# Patient Record
Sex: Female | Born: 1935 | Race: White | Hispanic: No | Smoking: Never smoker
Health system: Southern US, Community
[De-identification: ages and names within clinical notes are randomized; demographics above are authoritative.]

## PROBLEM LIST (undated history)

## (undated) DIAGNOSIS — K2100 Gastro-esophageal reflux disease with esophagitis, without bleeding: Secondary | ICD-10-CM

## (undated) DIAGNOSIS — M62838 Other muscle spasm: Secondary | ICD-10-CM

## (undated) DIAGNOSIS — K222 Esophageal obstruction: Secondary | ICD-10-CM

## (undated) DIAGNOSIS — I4891 Unspecified atrial fibrillation: Secondary | ICD-10-CM

## (undated) DIAGNOSIS — I499 Cardiac arrhythmia, unspecified: Secondary | ICD-10-CM

## (undated) DIAGNOSIS — R259 Unspecified abnormal involuntary movements: Secondary | ICD-10-CM

## (undated) DIAGNOSIS — F419 Anxiety disorder, unspecified: Secondary | ICD-10-CM

## (undated) DIAGNOSIS — I639 Cerebral infarction, unspecified: Secondary | ICD-10-CM

## (undated) DIAGNOSIS — L259 Unspecified contact dermatitis, unspecified cause: Secondary | ICD-10-CM

## (undated) DIAGNOSIS — E1129 Type 2 diabetes mellitus with other diabetic kidney complication: Secondary | ICD-10-CM

## (undated) DIAGNOSIS — C801 Malignant (primary) neoplasm, unspecified: Secondary | ICD-10-CM

## (undated) DIAGNOSIS — N3946 Mixed incontinence: Secondary | ICD-10-CM

## (undated) DIAGNOSIS — R930 Abnormal findings on diagnostic imaging of skull and head, not elsewhere classified: Secondary | ICD-10-CM

## (undated) DIAGNOSIS — K219 Gastro-esophageal reflux disease without esophagitis: Secondary | ICD-10-CM

## (undated) DIAGNOSIS — I739 Peripheral vascular disease, unspecified: Secondary | ICD-10-CM

## (undated) DIAGNOSIS — E039 Hypothyroidism, unspecified: Secondary | ICD-10-CM

## (undated) DIAGNOSIS — M545 Low back pain, unspecified: Secondary | ICD-10-CM

## (undated) DIAGNOSIS — R0600 Dyspnea, unspecified: Secondary | ICD-10-CM

## (undated) DIAGNOSIS — M4800 Spinal stenosis, site unspecified: Secondary | ICD-10-CM

## (undated) DIAGNOSIS — N390 Urinary tract infection, site not specified: Secondary | ICD-10-CM

## (undated) DIAGNOSIS — M25569 Pain in unspecified knee: Secondary | ICD-10-CM

## (undated) DIAGNOSIS — R002 Palpitations: Secondary | ICD-10-CM

## (undated) DIAGNOSIS — G4730 Sleep apnea, unspecified: Secondary | ICD-10-CM

## (undated) DIAGNOSIS — K3184 Gastroparesis: Secondary | ICD-10-CM

## (undated) DIAGNOSIS — M1990 Unspecified osteoarthritis, unspecified site: Secondary | ICD-10-CM

## (undated) DIAGNOSIS — I119 Hypertensive heart disease without heart failure: Secondary | ICD-10-CM

## (undated) DIAGNOSIS — D649 Anemia, unspecified: Secondary | ICD-10-CM

## (undated) DIAGNOSIS — E785 Hyperlipidemia, unspecified: Secondary | ICD-10-CM

## (undated) DIAGNOSIS — I509 Heart failure, unspecified: Secondary | ICD-10-CM

## (undated) DIAGNOSIS — N952 Postmenopausal atrophic vaginitis: Secondary | ICD-10-CM

## (undated) DIAGNOSIS — N183 Chronic kidney disease, stage 3 unspecified: Secondary | ICD-10-CM

## (undated) DIAGNOSIS — G609 Hereditary and idiopathic neuropathy, unspecified: Secondary | ICD-10-CM

## (undated) DIAGNOSIS — K449 Diaphragmatic hernia without obstruction or gangrene: Secondary | ICD-10-CM

## (undated) DIAGNOSIS — L820 Inflamed seborrheic keratosis: Secondary | ICD-10-CM

## (undated) DIAGNOSIS — M542 Cervicalgia: Secondary | ICD-10-CM

## (undated) DIAGNOSIS — R197 Diarrhea, unspecified: Secondary | ICD-10-CM

## (undated) DIAGNOSIS — I10 Essential (primary) hypertension: Secondary | ICD-10-CM

## (undated) DIAGNOSIS — N329 Bladder disorder, unspecified: Secondary | ICD-10-CM

## (undated) DIAGNOSIS — E559 Vitamin D deficiency, unspecified: Secondary | ICD-10-CM

## (undated) DIAGNOSIS — E119 Type 2 diabetes mellitus without complications: Secondary | ICD-10-CM

## (undated) DIAGNOSIS — L84 Corns and callosities: Secondary | ICD-10-CM

## (undated) DIAGNOSIS — L821 Other seborrheic keratosis: Secondary | ICD-10-CM

## (undated) DIAGNOSIS — E538 Deficiency of other specified B group vitamins: Secondary | ICD-10-CM

## (undated) DIAGNOSIS — R609 Edema, unspecified: Secondary | ICD-10-CM

## (undated) DIAGNOSIS — H538 Other visual disturbances: Secondary | ICD-10-CM

## (undated) DIAGNOSIS — G459 Transient cerebral ischemic attack, unspecified: Secondary | ICD-10-CM

## (undated) DIAGNOSIS — L299 Pruritus, unspecified: Secondary | ICD-10-CM

## (undated) DIAGNOSIS — Z8673 Personal history of transient ischemic attack (TIA), and cerebral infarction without residual deficits: Secondary | ICD-10-CM

## (undated) DIAGNOSIS — K5900 Constipation, unspecified: Secondary | ICD-10-CM

## (undated) DIAGNOSIS — N318 Other neuromuscular dysfunction of bladder: Secondary | ICD-10-CM

## (undated) DIAGNOSIS — N61 Mastitis without abscess: Secondary | ICD-10-CM

## (undated) DIAGNOSIS — I110 Hypertensive heart disease with heart failure: Secondary | ICD-10-CM

## (undated) DIAGNOSIS — R5383 Other fatigue: Secondary | ICD-10-CM

## (undated) DIAGNOSIS — F411 Generalized anxiety disorder: Secondary | ICD-10-CM

## (undated) DIAGNOSIS — R5381 Other malaise: Secondary | ICD-10-CM

## (undated) DIAGNOSIS — R300 Dysuria: Secondary | ICD-10-CM

## (undated) DIAGNOSIS — R110 Nausea: Secondary | ICD-10-CM

## (undated) HISTORY — DX: Corns and callosities: L84

## (undated) HISTORY — DX: Diaphragmatic hernia without obstruction or gangrene: K44.9

## (undated) HISTORY — PX: HERNIA REPAIR: SHX51

## (undated) HISTORY — DX: Hypertensive heart disease without heart failure: I11.9

## (undated) HISTORY — DX: Dyspnea, unspecified: R06.00

## (undated) HISTORY — DX: Gastro-esophageal reflux disease with esophagitis: K21.0

## (undated) HISTORY — DX: Anxiety disorder, unspecified: F41.9

## (undated) HISTORY — DX: Unspecified contact dermatitis, unspecified cause: L25.9

## (undated) HISTORY — PX: BACK SURGERY: SHX140

## (undated) HISTORY — DX: Abnormal findings on diagnostic imaging of skull and head, not elsewhere classified: R93.0

## (undated) HISTORY — DX: Mastitis without abscess: N61.0

## (undated) HISTORY — DX: Inflamed seborrheic keratosis: L82.0

## (undated) HISTORY — DX: Low back pain: M54.5

## (undated) HISTORY — PX: EYE SURGERY: SHX253

## (undated) HISTORY — DX: Type 2 diabetes mellitus with other diabetic kidney complication: E11.29

## (undated) HISTORY — DX: Other malaise: R53.81

## (undated) HISTORY — DX: Nausea: R11.0

## (undated) HISTORY — DX: Vitamin D deficiency, unspecified: E55.9

## (undated) HISTORY — DX: Mixed incontinence: N39.46

## (undated) HISTORY — DX: Spinal stenosis, site unspecified: M48.00

## (undated) HISTORY — DX: Cervicalgia: M54.2

## (undated) HISTORY — DX: Unspecified atrial fibrillation: I48.91

## (undated) HISTORY — DX: Essential (primary) hypertension: I10

## (undated) HISTORY — DX: Other seborrheic keratosis: L82.1

## (undated) HISTORY — DX: Low back pain, unspecified: M54.50

## (undated) HISTORY — DX: Other neuromuscular dysfunction of bladder: N31.8

## (undated) HISTORY — DX: Type 2 diabetes mellitus without complications: E11.9

## (undated) HISTORY — DX: Hyperlipidemia, unspecified: E78.5

## (undated) HISTORY — DX: Cerebral infarction, unspecified: I63.9

## (undated) HISTORY — DX: Transient cerebral ischemic attack, unspecified: G45.9

## (undated) HISTORY — DX: Diarrhea, unspecified: R19.7

## (undated) HISTORY — DX: Constipation, unspecified: K59.00

## (undated) HISTORY — DX: Gastro-esophageal reflux disease without esophagitis: K21.9

## (undated) HISTORY — DX: Postmenopausal atrophic vaginitis: N95.2

## (undated) HISTORY — DX: Generalized anxiety disorder: F41.1

## (undated) HISTORY — DX: Edema, unspecified: R60.9

## (undated) HISTORY — DX: Unspecified osteoarthritis, unspecified site: M19.90

## (undated) HISTORY — DX: Esophageal obstruction: K22.2

## (undated) HISTORY — DX: Other fatigue: R53.83

## (undated) HISTORY — DX: Other muscle spasm: M62.838

## (undated) HISTORY — DX: Chronic kidney disease, stage 3 (moderate): N18.3

## (undated) HISTORY — DX: Malignant (primary) neoplasm, unspecified: C80.1

## (undated) HISTORY — DX: Bladder disorder, unspecified: N32.9

## (undated) HISTORY — DX: Cardiac arrhythmia, unspecified: I49.9

## (undated) HISTORY — PX: ABDOMINAL HYSTERECTOMY: SHX81

## (undated) HISTORY — PX: SKIN CANCER DESTRUCTION: SHX778

## (undated) HISTORY — DX: Personal history of transient ischemic attack (TIA), and cerebral infarction without residual deficits: Z86.73

## (undated) HISTORY — DX: Hypertensive heart disease with heart failure: I11.0

## (undated) HISTORY — DX: Deficiency of other specified B group vitamins: E53.8

## (undated) HISTORY — DX: Anemia, unspecified: D64.9

## (undated) HISTORY — PX: APPENDECTOMY: SHX54

## (undated) HISTORY — DX: Gastroparesis: K31.84

## (undated) HISTORY — DX: Heart failure, unspecified: I50.9

## (undated) HISTORY — DX: Dysuria: R30.0

## (undated) HISTORY — DX: Sleep apnea, unspecified: G47.30

## (undated) HISTORY — DX: Palpitations: R00.2

## (undated) HISTORY — DX: Other visual disturbances: H53.8

## (undated) HISTORY — DX: Hereditary and idiopathic neuropathy, unspecified: G60.9

## (undated) HISTORY — DX: Chronic kidney disease, stage 3 unspecified: N18.30

## (undated) HISTORY — DX: Pain in unspecified knee: M25.569

## (undated) HISTORY — DX: Hypothyroidism, unspecified: E03.9

## (undated) HISTORY — PX: OTHER SURGICAL HISTORY: SHX169

## (undated) HISTORY — DX: Pruritus, unspecified: L29.9

## (undated) HISTORY — DX: Urinary tract infection, site not specified: N39.0

## (undated) HISTORY — DX: Gastro-esophageal reflux disease with esophagitis, without bleeding: K21.00

## (undated) HISTORY — DX: Unspecified abnormal involuntary movements: R25.9

## (undated) HISTORY — DX: Peripheral vascular disease, unspecified: I73.9

---

## 1963-08-02 HISTORY — PX: THYROIDECTOMY, PARTIAL: SHX18

## 1989-08-01 HISTORY — PX: CHOLECYSTECTOMY: SHX55

## 1999-11-05 ENCOUNTER — Encounter: Payer: Self-pay | Admitting: Internal Medicine

## 1999-11-05 ENCOUNTER — Encounter: Admission: RE | Admit: 1999-11-05 | Discharge: 1999-11-05 | Payer: Self-pay | Admitting: Internal Medicine

## 2000-03-10 ENCOUNTER — Other Ambulatory Visit: Admission: RE | Admit: 2000-03-10 | Discharge: 2000-03-10 | Payer: Self-pay | Admitting: Internal Medicine

## 2000-09-28 ENCOUNTER — Encounter: Payer: Self-pay | Admitting: Orthopedic Surgery

## 2000-09-28 ENCOUNTER — Encounter: Admission: RE | Admit: 2000-09-28 | Discharge: 2000-09-28 | Payer: Self-pay | Admitting: Orthopedic Surgery

## 2000-10-17 ENCOUNTER — Encounter: Payer: Self-pay | Admitting: Neurosurgery

## 2000-10-19 ENCOUNTER — Inpatient Hospital Stay (HOSPITAL_COMMUNITY): Admission: RE | Admit: 2000-10-19 | Discharge: 2000-10-20 | Payer: Self-pay | Admitting: Neurosurgery

## 2000-10-19 ENCOUNTER — Encounter: Payer: Self-pay | Admitting: Neurosurgery

## 2000-12-12 ENCOUNTER — Encounter: Admission: RE | Admit: 2000-12-12 | Discharge: 2000-12-28 | Payer: Self-pay | Admitting: Neurosurgery

## 2001-03-21 ENCOUNTER — Ambulatory Visit (HOSPITAL_BASED_OUTPATIENT_CLINIC_OR_DEPARTMENT_OTHER): Admission: RE | Admit: 2001-03-21 | Discharge: 2001-03-21 | Payer: Self-pay | Admitting: Pulmonary Disease

## 2001-05-23 ENCOUNTER — Other Ambulatory Visit: Admission: RE | Admit: 2001-05-23 | Discharge: 2001-05-23 | Payer: Self-pay | Admitting: Endocrinology

## 2002-12-10 ENCOUNTER — Ambulatory Visit (HOSPITAL_COMMUNITY): Admission: RE | Admit: 2002-12-10 | Discharge: 2002-12-10 | Payer: Self-pay | Admitting: Gastroenterology

## 2002-12-10 ENCOUNTER — Encounter: Payer: Self-pay | Admitting: Gastroenterology

## 2002-12-31 ENCOUNTER — Ambulatory Visit (HOSPITAL_COMMUNITY): Admission: RE | Admit: 2002-12-31 | Discharge: 2002-12-31 | Payer: Self-pay | Admitting: Gastroenterology

## 2003-01-23 ENCOUNTER — Ambulatory Visit (HOSPITAL_COMMUNITY): Admission: RE | Admit: 2003-01-23 | Discharge: 2003-01-23 | Payer: Self-pay | Admitting: Gastroenterology

## 2003-03-10 ENCOUNTER — Encounter: Payer: Self-pay | Admitting: Internal Medicine

## 2003-03-10 ENCOUNTER — Encounter: Payer: Self-pay | Admitting: Emergency Medicine

## 2003-03-10 ENCOUNTER — Inpatient Hospital Stay (HOSPITAL_COMMUNITY): Admission: EM | Admit: 2003-03-10 | Discharge: 2003-03-14 | Payer: Self-pay | Admitting: Emergency Medicine

## 2003-03-11 ENCOUNTER — Encounter: Payer: Self-pay | Admitting: Cardiology

## 2003-08-04 LAB — HM DEXA SCAN

## 2003-08-21 ENCOUNTER — Encounter: Admission: RE | Admit: 2003-08-21 | Discharge: 2003-08-21 | Payer: Self-pay | Admitting: Internal Medicine

## 2004-07-23 ENCOUNTER — Ambulatory Visit: Payer: Self-pay | Admitting: Internal Medicine

## 2004-08-03 ENCOUNTER — Ambulatory Visit: Payer: Self-pay | Admitting: Internal Medicine

## 2004-09-06 ENCOUNTER — Ambulatory Visit: Payer: Self-pay | Admitting: Internal Medicine

## 2004-09-13 ENCOUNTER — Ambulatory Visit: Payer: Self-pay

## 2004-09-15 ENCOUNTER — Ambulatory Visit: Payer: Self-pay | Admitting: Gastroenterology

## 2004-09-17 ENCOUNTER — Ambulatory Visit (HOSPITAL_COMMUNITY): Admission: RE | Admit: 2004-09-17 | Discharge: 2004-09-17 | Payer: Self-pay | Admitting: Gastroenterology

## 2004-09-23 ENCOUNTER — Ambulatory Visit: Payer: Self-pay | Admitting: Gastroenterology

## 2006-03-05 ENCOUNTER — Encounter: Admission: RE | Admit: 2006-03-05 | Discharge: 2006-03-05 | Payer: Self-pay | Admitting: *Deleted

## 2006-03-21 ENCOUNTER — Ambulatory Visit: Admission: RE | Admit: 2006-03-21 | Discharge: 2006-03-21 | Payer: Self-pay | Admitting: Neurosurgery

## 2006-04-12 ENCOUNTER — Inpatient Hospital Stay (HOSPITAL_COMMUNITY): Admission: RE | Admit: 2006-04-12 | Discharge: 2006-04-19 | Payer: Self-pay | Admitting: Neurosurgery

## 2007-11-26 ENCOUNTER — Ambulatory Visit: Payer: Self-pay | Admitting: Internal Medicine

## 2007-12-07 ENCOUNTER — Ambulatory Visit: Payer: Self-pay | Admitting: Internal Medicine

## 2008-01-07 ENCOUNTER — Telehealth: Payer: Self-pay | Admitting: Internal Medicine

## 2008-01-17 ENCOUNTER — Encounter: Payer: Self-pay | Admitting: Internal Medicine

## 2010-08-01 DIAGNOSIS — G459 Transient cerebral ischemic attack, unspecified: Secondary | ICD-10-CM

## 2010-08-01 HISTORY — DX: Transient cerebral ischemic attack, unspecified: G45.9

## 2010-12-14 NOTE — Assessment & Plan Note (Signed)
[facility] OFFICE NOTE   NAME:Karen [name], Karen [name]                 MRN:          [id number]  DATE:11/26/2007                            DOB:          [birth year]    REASON FOR CONSULTATION:  Dysphagia with regurgitation and vomiting.   HISTORY:  This is an 77 year old white female with a history of  hypertension, diabetes, cardiac dysrhythmia, dyslipidemia,  hypothyroidism, arthritis, anxiety, and sleep apnea.  She is referred  through the courtesy of Dr. Nyoka Cowden regarding dysphagia, nausea and  vomiting.  The patient reports difficulties keeping down food or liquids  over the past 2 months.  Food and pills seem to be caught in the throat  area.  She states that the only relief is to induce vomiting.  She has  also had some chest pains which she states are similar to her myocardial  infarction.  She was evaluated by her primary care physician who did not  feel chest discomfort was necessarily cardiac.  The patient has been  regurgitating solids and liquids as well.  She thinks her vomitus is  occasionally blood tinged.  She has had some pharyngeal burning.  She  states that she has lost 8-9 pounds over the past 2 months.  She has  chronic alternating bowel habits which are unchanged.  No melena or  hematochezia.   PAST MEDICAL HISTORY:  As above.   PAST SURGICAL HISTORY:  Cholecystectomy, hernia repair, hysterectomy,  tubal ligation, breast surgery, appendectomy, and back surgery.   ALLERGIES:  1. SULFA.  2. VIOXX.  3. MACRODANTIN.  4. DOXYCYCLINE.  5. NONSTEROIDAL ANTI-INFLAMMATORIES.   CURRENT MEDICATIONS:  Prilosec, Lisinopril, Benicar, Synthroid, digoxin,  Xanax, Vicodin, Lantus insulin, NovoLog, and cyclobenzaprine.   FAMILY HISTORY:  Negative for gastrointestinal malignancy.  Father with  heart disease.   SOCIAL HISTORY:  The patient is married but her husband has been in a  nursing home for  3.5 years.  They had 3 children.  She lives alone.  She  worked previously at [employer] at [employer].  She does not smoke  or use alcohol.   REVIEW OF SYSTEMS:  Multiple entries per diagnostic evaluation.   PHYSICAL EXAMINATION:  GENERAL:  A pleasant elderly female in no acute  distress.  VITAL SIGNS:  Blood pressure is 142/58, heart rate is 100, weight is  134.4 pounds.  She is 5 feet 5 inches in height.  HEENT:  Sclerae are anicteric.  Conjunctivae are pink.  Oral mucosa is  intact.  No adenopathy.  LUNGS:  Clear.  HEART:  Regular.  ABDOMEN:  Soft, nontender, nondistended with good bowel sounds.  No  organomegaly, mass, or hernia.  EXTREMITIES:  Without clubbing, cyanosis, or edema.  Pulses are normal.  NEUROLOGIC:  She is alert and oriented x4 without focal deficit.   ANCILLARY DATA:  The patient last underwent upper endoscopy with Dr. Lyla Son, on September 23, 2004.  At that time she was dilated with a 54-  [place] Maloney dilator.  It should be noted that she also underwent  diagnostic esophagram, barium esophagram on September 17, 2004.  This  revealed tertiary spasms, anklyosing spondylitis, and thickening of the  distal esophagus though no obvious stricture.  A tablet passed without  resistance.   IMPRESSION:  This is an 77 year old female with multiple medical  problems who presents with what sounds like cervical dysphagia.   She may have a Zenker diverticulum, though none noted previously.  She  could also have cricopharyngeal hypertension or even a stricture not  appreciated previously.  In any event, she has had benefit from dilation  for similar symptoms in the past and wishes to proceed with that exam.  I certainly agree, as this may again help.  In terms of her diabetes, we will adjust her insulin pre-procedure.  I did review with her the nature of the procedure as well as risks,  benefits, and alternatives in detail.  She understood and agreed to   proceed.     Docia Chuck. Henrene Pastor, MD  Electronically Signed    JNP/MedQ  DD: 12/07/2007  DT: 12/07/2007  Job #: [id number]   cc:   Viviann Spare. Nyoka Cowden, M.D.

## 2010-12-17 NOTE — Op Note (Signed)
NAMEMarland Kitchen  Karen, GRZESKOWIAK NO.:  [id number]   MEDICAL RECORD NO.:  [id number]          PATIENT TYPE:  INP   LOCATION:  3313                         FACILITY[facility]  [facility]   PHYSICIAN:  Ashok Pall, M.D.     DATE OF BIRTH:  09/18/1923   DATE OF PROCEDURE:  04/12/2006  DATE OF DISCHARGE:                                 OPERATIVE REPORT   PREOPERATIVE DIAGNOSES:  1. Lumbar stenosis L3-4.  2. Lumbar spondylosis without myelopathy, L3-4.  3. Low back pain.  4. Lumbar radiculopathy.   POSTOPERATIVE DIAGNOSES:  1. Lumbar stenosis L3-4.  2. Lumbar spondylosis without myelopathy, L3-4.  3. Low back pain.  4. Lumbar radiculopathy.   PROCEDURES:  1. Posterior lumbar interbody arthrodesis, L3-4, using 11-mm PEEK cages      packed with local autograft, morcellized.  2. Posterolateral arthrodesis, L3-4,with local autograft, morcellized.  3. Pedicle screw fixation, nonsegmental, L3 to L4, 6.5 x45 mm Legacy      screws.   SURGEON:  Ashok Pall, M.D.   ASSISTANT:  Otilio Connors, M.D.   ANESTHESIA:  General endotracheal.   INDICATIONS:  Karen [name] is a  77 year old with severe stenosis at L3-4.  Secondary to that and secondary to facet arthropathy, she had compromise of  the L3 nerve roots and the L4 nerve roots.  I have recommended after she had  conservative treatment without relief of pain that she undergo decompressive  laminectomy and subsequent arthrodesis.   Karen [name] was brought to the operating room, intubated and placed under  a general anesthetic without difficulty.  She had a Foley catheter placed  under sterile conditions.  She was rolled prone onto a Jackson table and all  pressure points were properly padded.  Her back was prepped and she was  draped in a sterile fashion.  After infiltrating 80 mL of 0.5% lidocaine and  1:200,000 strength epinephrine into the lumbar region, I opened the skin  with a #10 blade.  I took this down to the thoracolumbar  fascia.  I then  exposed the lamina of L2, L3 and of L4.  I confirmed my location by taking  an intraoperative x-ray.  I then proceeded with a laminectomy.  I then  completed the laminectomy of L3, removing the ligamentum flavum and then  decompressing the thecal sac.  Did a hemilaminectomy of L4.  I did this  until I felt that there was no pressure left on the thecal sac.  I went out  into the lateral gutters and performed inferior facetectomy of L3  bilaterally.  I then opened the neural foramina for L3 aggressively and  widely on both sides.  I retracted the thecal sac medially.   After retracting the thecal sac, I then proceeded with the diskectomy and  posterior lumbar interbody arthrodesis.  I removed the disk material on the  left and right side using pituitary curettes, Synthes disk space shavers,  Kerrison punches also and other curettes.  After removing the disk material,  I prepared the endplates for arthrodesis.  I then packed two 11 mm PEEK  cages with local morcellized autograft.  I then placed these into the disk  space after packing the disk space with bone.  Both cages went in without  difficulty.  I then turned my attention to the pedicle screws.   I localized the pedicles in the AP plane.  I then with the use of lateral  fluoroscopy placed the pedicle screws, two screws at L3, two screws at L4.  This was done with the assistance of Dr. Luiz Ochoa.  Each screw was first found  with a __________ pedicle probe, then tapped.  Each hole was then inspected  for the integrity of the pedicle cortex.  The screws were then placed  without difficulty into the holes.  I decorticated the transverse processes  of L3 and L4 bilaterally.  Morcellized autograft was placed laterally over  the decorticated transverse processes bilaterally.  I then placed rods and  secured the rods.  They were locked into position and the caps were locked  into position.  I then irrigated the wound.  X-ray  showed the screws and the  PEEK cages to be in good position.  I closed with a layered fashion using  Vicryl sutures reapproximating the thoracolumbar fascia, subcutaneous tissue  and subcuticular layers.  Dermabond was used for sterile dressing.           ______________________________  Ashok Pall, M.D.     KC/MEDQ  D:  04/12/2006  T:  04/13/2006  Job:  [id number]

## 2010-12-17 NOTE — Discharge Summary (Signed)
[facility]  Patient:    Karen [name], Karen [name]                 MRN: [id number] Adm. Date:  [id number] Disc. Date: [id number] Attending:  Hazle Coca                           Discharge Summary  ADMITTING DIAGNOSES:  Lumbar stenosis and herniated nucleus pulposus at L5-S1  DISCHARGE DIAGNOSES:  Lumbar stenosis and herniated nucleus pulposus at L5-S1  PROCEDURE:  Bilateral L4-5 and 5-1 decompressive semi-hemilaminectomies and foraminotomies with medial fasciectomies, L5-S1 bilateral diskectomy, microdissection with microscope.  REASON FOR ADMISSION:  The patient is a 77 year old woman, who has had years of back pain, radiating to both legs.  It has always been worse on the left side but over the last month, she had all of a sudden a worse pain over the left leg in L5 distribution.  Now she really is being hampered in her walking and her activities.  MRI was done showing lateral recessed stenosis at 4-5 and 5-1 worse at 5-1 with bilateral disk herniations at the 5-1 level with a far lateral disk herniation over on the left impinging on the 5 root.  The patient was admitted for surgery.  HOSPITAL COURSE:  The patient was admitted to day surgery and underwent the procedure named above without complications.  Postop the patient was transferred to the recovery room and then to the floor.  There, she has been up ambulating.  She is having some moderate incisional pain but much less to no leg pains.  She does feel some tightness in the legs.  She has some chronic left leg edema, but this is unchanged from her baseline.  Incision clean, dry, and intact.  She has been ambulating.  She is eating and doing well.  We will discharge her home in stable condition.  DISCHARGE MEDICATIONS:  Same as prehospitalization plus Flexeril and Percocet p.r.n.  DIET:  As tolerated.  ACTIVITY:  No stress activity.  No sitting longer than 20 minutes.  No bending or  twisting.  May ambulate.  Keep incision dry five days but may shower.  FOLLOW-UP:  In three weeks in my office. DD:  10/20/00 TD:  10/21/00 Job: [id number]

## 2010-12-17 NOTE — Op Note (Signed)
[place]  Patient:    Karen [name], Karen [name]                 MRN: [id number] Proc. Date: 10/19/00 Adm. Date:  [id number] Attending:  Hazle Coca                           Operative Report  PREOPERATIVE DIAGNOSIS:  Lumbar stenosis at L4-5 and L5-S1 with herniated nucleus pulposus at L5-S1 bilaterally, with far lateral disk herniation on the left.  POSTOPERATIVE DIAGNOSIS:  Lumbar stenosis at L4-5 and L5-S1 with herniated nucleus pulposus at L5-S1 bilaterally, with far lateral disk herniation on the left.  PROCEDURES:  Bilateral L4-5 and L5-S1 decompressive semihemilaminectomies with foraminotomies and medial facetectomy, L5-S1 bilateral diskectomy, microdissection with microscope.  SURGEON:  Otilio Connors, M.D.  ASSISTANT:  Gwenyth Ober. Rolin Barry, M.D.  ANESTHESIA:  General endotracheal tube anesthesia.  ESTIMATED BLOOD LOSS:  Minimal.  BLOOD GIVEN:  None.  DRAINS:  None.  COMPLICATIONS:  None.  REASON FOR PROCEDURE:  The patient is a 77 year old woman who has had years of back and bilateral leg pain and has had neurogenic claudication that has been worsening over the time and over the last month has had more acute L5 radiculopathy on the left side.  MRI shows lateral recess stenosis at 4-5, worse at 5-1 with bilateral disk herniation at 5-1 and on the left side far lateral, impinging the 5 root.  Patient brought in for a decompression.  DESCRIPTION OF PROCEDURE:  The patient was brought in the operating room, and general anesthesia was induced.  The patient was placed in the prone position on the Wilson frame with all pressure points padded.  The patient was prepped and draped in a sterile fashion.  The site of the incision was injected with 10 cc of 1% lidocaine with epinephrine.  The incision was then made over the L4-5 and sacral spinous processes and the incision taken down to the fascia. Hemostasis obtained with Bovie  cauterization.  The fascia was incised with the Bovie, and subperiosteal dissection was done over the L4, L5, and S1 spinous processes and laminae out to the facets but not entering the facet on the left side.  A marker was placed at the 5-1 interspace, x-ray obtained confirming positioning.  Dissection was then done on the right side with subperiosteal dissection over the L4, L5, and the sacrum.  The microscope was brought in for microdissection at this point.  Starting at 4-5 on the left, semihemilaminectomy was performed with high-speed drill and Kerrison punches. Ligamentum flavum was removed, and the dura was seen.  There was a very thickened ligament out in the lateral recess.  This was removed with Kerrison punches.  Medial facetectomy minimally was performed here.  The superior aspect of L5 was also removed to decompress the canal.  We were able to place _____, following the 4 and 5 roots easily at this point.  Hemostasis was obtained with Gelfoam and thrombin.  The disk space was explored.  There was just a minimal bulge, and the disk space was not incised and diskectomy not performed.  Attention was then taken to the right side at 4-5, where the procedure was repeated.  We used the high-speed drill and Kerrison punches.  A semihemilaminectomy and medial facetectomy was performed.  Also removed the superior part of L5, decompressing the thecal sac.  There was even more recess stenosis with hypertrophic ligament  and some spurs off the facet on this side, and these were removed, again decompressing out laterally.  There was no pressure on the 4-5 roots after this.  Attention was then taken to the right 5-1 level, where semihemilaminectomy was performed, removing a part of L5, medial facetectomy, part of the sacrum.  Foraminotomy was done over the S1 roots.  There was some definite spurring and calcified mass coming off the facet and pushing into the S1 root, and this was removed.  The  disk space was explored, and there was a large subligamentous disk herniation.  The disk space was incised and diskectomy performed with pituitary rongeurs and curettes.  Attention was then taken over to the left side at L5-S1, where again the semihemilaminectomy and medial facetectomy was performed.  A larger calcified mass was there within the hypertrophic ligaments, indenting into the S1 root.  This was removed with decompression at the root.  The lateral recess was definitely stenosed and opened with Kerrison punches.  Foraminotomy over the S1 region was also performed here.  On this side, it was not herniated. It was actually fairly flat, and this side was the far lateral disk herniation.  The disk space was incised and diskectomy performed, and attention was given to going lateral, and we did remove lateral fragments from this paramedian approach.  After we were satisfied that a complete diskectomy at 5-1 was done bilaterally, then we again checked the nerve roots of S1, L5, and L4 bilaterally.  They were all decompressed, and good central and lateral recess decompression at both levels bilaterally.  Hemostasis obtained with Gelfoam and thrombin was then irrigated out, and the Gelfoam was placed over the dura and retractors removed, and the fascia was closed with 0 Vicryl interrupted sutures, and the subcutaneous tissue was closed with 2-0 and 3-0 Vicryl interrupted sutures, and the skin closed with Steri-Strips.  A dressing was placed.  The patient was awakened from anesthesia and transferred to the recovery room in stable condition. DD:  10/19/00 TD:  10/19/00 Job: [id number]

## 2010-12-17 NOTE — Op Note (Signed)
   NAME:  Karen [name], Karen [name]                    ACCOUNT NO.:  [id number]   MEDICAL RECORD NO.:  [id number]                   PATIENT TYPE:  AMB   LOCATION:  ENDO                                 FACILITY:  [facility]   PHYSICIAN:  Loralee Pacas. Sharlett Iles, M.D. [facility]        DATE OF BIRTH:  23-Oct-1923   DATE OF PROCEDURE:  12/31/2002  DATE OF DISCHARGE:  12/31/2002                                 OPERATIVE REPORT   PROCEDURE PERFORMED:  Esophageal manometry.   Esophageal manometry was completed on December 31, 1999 in this 77 year old white  female who apparently has a chronic dysphagia.  She has had a barium swallow  which apparently shows changes of esophageal spasm.  Results of manometry as  follows:   1. Upper esophageal sphincter.  There appears to be normal coordination     between pharyngeal contractions and cricopharyngeal relaxation.  2. Lower esophageal sphincter.  Mean pressure is slightly elevated to 30 to     58mmHg but there appears to be normal relaxation to swallowing.  3. Motility pattern. There are normally propagated peristaltic waves 70% of     the time in the esophagus.  However, mean amplitudes of these waves was     increased to 143mmHg and some were greater than 6 seconds in duration.   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   RECOMMENDATIONS:  This patient is to have follow-up with Dr. Velora Heckler as  previously scheduled.                                               Loralee Pacas. Sharlett Iles, M.D. [facility]    DRP/MEDQ  D:  01/14/2003  T:  01/14/2003  Job:  [id number]   cc:   Clarene Reamer, M.D. [facility]

## 2010-12-17 NOTE — Discharge Summary (Signed)
NAME:  Karen [name], Karen [name]                    ACCOUNT NO.:  [id number]   MEDICAL RECORD NO.:  [id number]                   PATIENT TYPE:  INP   LOCATION:  [id number]                                 FACILITY:  [facility]   PHYSICIAN:  Gwendolyn Grant, M.D. [facility]          DATE OF BIRTH:  May 29, 1924   DATE OF ADMISSION:  03/10/2003  DATE OF DISCHARGE:  03/14/2003                                 DISCHARGE SUMMARY   DISCHARGE DIAGNOSES:  1. Diarrhea.  2. Infectious colitis.  3. Leukocytosis.  4. Normocytic anemia.  5. Hyponatremia.  6. Acute renal insufficiency.   BRIEF HISTORY:  Mrs. Karen [name] is a 77 year old white female who presented to  the office on the day of admission with a five-day history of nausea,  vomiting, diarrhea and crampy abdominal pain.  Patient was seen on March 07, 2003 with complaint of watery loose stools which was suspected to be a  viral gastroenteritis.  She was instructed to change her diet to clears and  use small doses of Imodium as needed.  The patient followed back up in the  office on March 10, 2003 with persistent complaint of diarrhea.  She stated  that she was having stools every 20-40 minutes.  She was also having some  lower abdominal pain and nausea with dry heaves.  Dr. Asa Lente decided to  send the patient to the emergency room for IV hydration.  She was evaluated  by the emergency room physician who felt she warranted hospitalization.  Patient was hypotensive.  Her systolic blood pressure in the office was 88.   PAST MEDICAL HISTORY:  1. Adult-onset diabetes mellitus, insulin-requiring, with history of     diabetic gastroparesis.  2. Degenerative joint disease.  3. Hypothyroidism.  4. Hypertension.  5. Osteoporosis.  6. OSA with nightly BiPAP.   HOSPITAL COURSE:  #1.  GASTROINTESTINAL:  Patient presented with diarrhea  and a mild leukocytosis.  It was suspected to be viral versus bacterial  gastroenteritis versus some inflammatory bowel  disease.  CT of the abdomen  and pelvis was obtained and revealed a diffuse colonic wall-thickening with  involvement of the terminal ileum, a nonspecific colitis.  Then, the patient  was empirically started on Cipro and Flagyl.  GI was asked to see the  patient.  She was seen in consultation by Dr. Deatra Ina who agreed this was  probably infectious enterocolitis, as evident by diffuse involvement of the  colon.  He noted that the pattern would be unusual for ischemia.  He notes  she may have some inflammatory bowel disease, but the abrupt onset of  symptoms mitigate against this.  He agreed with antibiotics.  He recommended  avoiding anticholinergics.  Patient's condition improved.  It was not felt  an inpatient colonoscopy was warranted, and the patient prefers that she  follow up with Dr. Lyla Son.  Patient's condition has improved.  She is  afebrile.  Her white count has normalized, and she  is tolerating a diet.  It  is felt that she is stable for discharge home.  We will have her complete a  7-day course of Cipro and Flagyl.   #2.  NORMOCYTIC ANEMIA:  Patient's hemoglobin dropped approximately 2 g  during this hospitalization.  This was felt to be, at least partly,  dilutional.  Iron studies were consistent with current disease.  The patient  did not have any heme-positive stools.  She has remained hemodynamically  stable.   #3.  HYPONATREMIA:  Patient presented with some mild hyponatremia, likely  secondary to dehydration and gastrointestinal loss.  This has corrected.   #4.  ACUTE RENAL INSUFFICIENCY:  Also thought to be secondary to dehydration  from gastrointestinal loss, although she is also on HCTZ with her  Lisinopril.  This corrected with intravenous fluids.  Her Lisinopril and  HCTZ have been resumed.   #5.  CARDIOVASCULAR:  The patient has some wide pulse pressures with a  murmur.  Dr. Asa Lente was concerned about an aortic stenosis.  We did check  a 2-D echo that  revealed preserved LV function, no wall motion  abnormalities, with mild aortic valve-thickening but no aortic stenosis.  She also had a 1.31 second pause.  Her dig level was elevated at 4, and her  dig has been held for the past three days.  We will resume her dig at  discharge at half the dose.   #6.  ADULT-ONSET DIABETES MELLITUS:  The patient's blood sugars have  remained under 200.  She has not received her Avandia or her 70/30 while she  has been here.   LABORATORIES AT DISCHARGE:  Total iron 34, TIBC 218, percent sat 16, BUN 25,  creatinine 1.1, sodium 135; hemoglobin 8.9, hematocrit 26.1.   MEDICATIONS AT DISCHARGE:  1. Cipro 500 mg b.i.d. for 3 days.  2. Flagyl 250 mg q.i.d. for 3 days.  3. Synthroid 100 mcg q.d.  4. Lanoxin 0.25 mg 1/2 tab q.d.  5. Bextra 10 mg q.d.  6. Avandia 4 mg q.d.  7. Reglan - she has been instructed to hold this until further instructed by     Dr. Asa Lente, secondary to her recent diarrhea and enterocolitis.  8. Lisinopril/HCTZ 20/12.5 mg b.i.d., 70/30 as at home.   FOLLOW UP:  1. With Dr. Enrigue Catena, May 05, 2003 at 9:15 a.m. to schedule a     colonoscopy.  2. Dr. Asa Lente in the next week for a repeat CBC.      Helayne Seminole, P.A. [facility]                  Gwendolyn Grant, M.D. [facility]    LC/MEDQ  D:  03/13/2003  T:  03/14/2003  Job:  [id number]   cc:   Clarene Reamer, M.D. [facility]

## 2010-12-17 NOTE — Discharge Summary (Signed)
NAMEMarland Kitchen  KENSLI, KOREY NO.:  [id number]   MEDICAL RECORD NO.:  [id number]          PATIENT TYPE:  INP   LOCATION:  3002                         FACILITY:  [facility]   PHYSICIAN:  Otilio Connors, M.D.  DATE OF BIRTH:  1924-03-28   DATE OF ADMISSION:  04/12/2006  DATE OF DISCHARGE:  04/19/2006                                 DISCHARGE SUMMARY   DATE OF ADMISSION:  April 12, 2006   DATE OF DISCHARGE:  April 19, 2006   DIAGNOSIS:  Lumbar stenosis, L3-4, and spondylosis   DISCHARGE DIAGNOSIS:  Lumbar stenosis, L3-4, and spondylosis.   PROCEDURE:  Posterior lumbar interbody fusion at 3-4 with interbody cages,  pedicle screw fixation with Legacy screws.   REASON FOR ADMISSION:  The patient is a 77 year old woman with severe  stenosis L3-4, has not had relief with nonsurgical measures and admitted for  decompressive lumbar interbody fusion.   HOSPITAL COURSE:  The patient was admitted the day of surgery, underwent  procedure above with no complications.  Postop the patient was transferred  to the recovery room __________.  She made some progress with increase in  her activity, mobilizing, and continued making progress.  Incisions remained  clean, dry, and intact.  PT/OT were consulted and worked with patient.  She  continued to have increase in her activity and home health was set for her  and on April 19, 2006, the patient was discharged home in stable  condition.  She will be followed with Dr. Christella Noa in a few weeks.  Activity  as tolerated.  No strenuous activity.           ______________________________  Otilio Connors, M.D.     JRH/MEDQ  D:  05/04/2006  T:  05/04/2006  Job:  [id number]

## 2010-12-17 NOTE — H&P (Signed)
[facility]  Patient:    Karen [name], Karen [name]                 MRN: [id number] Adm. Date:  [id number] Attending:  Hazle Coca                         History and Physical  CHIEF COMPLAINT:  Back and bilateral leg pain.  BRIEF HISTORY:  The patient is a 77 year old woman who for years has had back pain radiating to both legs.  It has always been worse on the left side.  She had been through multiple nonsteroidal anti-inflammatory regimens and physical therapy but has not had any improvement.  About 1 month ago, all of a sudden, the leg pain has been much worse with pain radiating down to the top of her foot, and this is constantly there now.  Meanwhile she has neurogenic claudication at about half a mile or less with pain going down both legs and general weakness in her legs.  She has no bowel or bladder complaints.  No prior back surgeries.  MRI was done showing spondylitic change in the lower lumbar areas worse at 4-5 and 5-1 with lateral recess stenosis bilaterally, worse on the right 4-5, and at 5-1 there is a right-sided disk herniation and some severe lateral spondylosis and on the left there is a far lateral disk herniation that probably impinges the 5 root.  The patient is admitted for surgery.  PAST MEDICAL HISTORY: 1. Significant for hypertension 2. Insulin-dependent diabetes mellitus. 3. Irregular heart beat. 4. Overactive bladder which is chronic.  PRIOR SURGERIES: 1. Thyroidectomy in 1965. 2. Hysterectomy in 1972. 3. Gallstone surgery in 1991. 4. Cataract surgery in 1995 and 1996.  CURRENT MEDICATIONS: 1. Humulin 70/30 25 in the morning and 18 at night. 2. Alprazolam p.r.n. 3. Metoclopramide 10 q. day. 4. Lanoxin 0.25 mg q. day. 5. Nifedipine 30 mg q. day. 6. Synthroid. 7. Premarin.  ALLERGIES:  Adverse reactions include SULFA, DOXYCYCLINE, CELEBREX, VIOXX and MACRODANTIN.  SOCIAL HISTORY:  She does not smoke or drink  alcohol.  She is retired Museum/gallery curator.  Is married, and lives with her husband.  REVIEW OF SYSTEMS:  Otherwise negative.  FAMILY HISTORY:  Noncontributory.  PHYSICAL EXAMINATION:  GENERAL:  The patient is pleasant and in some mild distress with leaning to the right.  VITAL SIGNS:  Weight 127, height 5 feet 1/2 inches.  Blood pressure 132/70, pulse 81, temperature 98.3.  HEENT:  Unremarkable.  NECK: Supple.  LUNGS:  Clear.  HEART:  Regular rhythm.  ABDOMEN:  Soft, nontender.  EXTREMITIES:  Intact, no edema.  BACK AND NEUROLOGIC EXAM:  Shows range of motion of the back decreased in all directions secondary to increasing pain.  Mild paraspinous muscle spasm and tenderness are noted.  There is no tenderness to the hip bursa.  Gait is done fairly well and may be slightly flexed at the waist on straight leg raising. Negative bilaterally.  Motor strength is 5/5 in all muscle groups except for left extensor hallucis longus which is 5-/5 in strength and deep tendon reflexes are 2+/4 at the knees and diminished at the ankles and sensation is intact throughout.  ASSESSMENT AND LAN:  The patient with lateral recess stenosis and disk herniation bilaterally at 5-1, left side more far lateral probably affecting the 5 root.  The patient is admitted for decompression of the lumbar spine at those two levels. DD:  10/19/00 TD:  10/19/00 Job: [id number]

## 2010-12-26 ENCOUNTER — Inpatient Hospital Stay (HOSPITAL_COMMUNITY)
Admission: EM | Admit: 2010-12-26 | Discharge: 2010-12-28 | DRG: 069 | Disposition: A | Discharge status: Home Health | Payer: Medicare Other | Attending: Internal Medicine | Admitting: Internal Medicine

## 2010-12-26 ENCOUNTER — Emergency Department (HOSPITAL_COMMUNITY): Payer: Medicare Other

## 2010-12-26 DIAGNOSIS — K219 Gastro-esophageal reflux disease without esophagitis: Secondary | ICD-10-CM | POA: Diagnosis present

## 2010-12-26 DIAGNOSIS — E1142 Type 2 diabetes mellitus with diabetic polyneuropathy: Secondary | ICD-10-CM | POA: Diagnosis present

## 2010-12-26 DIAGNOSIS — G459 Transient cerebral ischemic attack, unspecified: Principal | ICD-10-CM | POA: Diagnosis present

## 2010-12-26 DIAGNOSIS — E1149 Type 2 diabetes mellitus with other diabetic neurological complication: Secondary | ICD-10-CM | POA: Diagnosis present

## 2010-12-26 DIAGNOSIS — Z794 Long term (current) use of insulin: Secondary | ICD-10-CM

## 2010-12-26 DIAGNOSIS — Z7982 Long term (current) use of aspirin: Secondary | ICD-10-CM

## 2010-12-26 DIAGNOSIS — G8929 Other chronic pain: Secondary | ICD-10-CM | POA: Diagnosis present

## 2010-12-26 DIAGNOSIS — R269 Unspecified abnormalities of gait and mobility: Secondary | ICD-10-CM | POA: Diagnosis present

## 2010-12-26 DIAGNOSIS — I10 Essential (primary) hypertension: Secondary | ICD-10-CM | POA: Diagnosis present

## 2010-12-26 DIAGNOSIS — M549 Dorsalgia, unspecified: Secondary | ICD-10-CM | POA: Diagnosis present

## 2010-12-26 DIAGNOSIS — M1990 Unspecified osteoarthritis, unspecified site: Secondary | ICD-10-CM | POA: Diagnosis present

## 2010-12-26 DIAGNOSIS — N179 Acute kidney failure, unspecified: Secondary | ICD-10-CM | POA: Diagnosis present

## 2010-12-26 DIAGNOSIS — E785 Hyperlipidemia, unspecified: Secondary | ICD-10-CM | POA: Diagnosis present

## 2010-12-26 DIAGNOSIS — F411 Generalized anxiety disorder: Secondary | ICD-10-CM | POA: Diagnosis present

## 2010-12-26 LAB — BASIC METABOLIC PANEL
BUN: 25 mg/dL — ABNORMAL HIGH (ref 6–23)
CO2: 27 mEq/L (ref 19–32)
Chloride: 100 mEq/L (ref 96–112)
Creatinine, Ser: 1.3 mg/dL — ABNORMAL HIGH (ref 0.4–1.2)

## 2010-12-26 LAB — CBC
MCV: 93.5 fL (ref 78.0–100.0)
Platelets: 279 10*3/uL (ref 150–400)
RDW: 12.5 % (ref 11.5–15.5)
WBC: 12.2 10*3/uL — ABNORMAL HIGH (ref 4.0–10.5)

## 2010-12-26 LAB — CK TOTAL AND CKMB (NOT AT ARMC): Total CK: 149 U/L (ref 7–177)

## 2010-12-26 LAB — APTT: aPTT: 31 seconds (ref 24–37)

## 2010-12-27 ENCOUNTER — Inpatient Hospital Stay (HOSPITAL_COMMUNITY): Payer: Medicare Other

## 2010-12-27 LAB — DIFFERENTIAL
Lymphocytes Relative: 34 % (ref 12–46)
Lymphs Abs: 3.6 10*3/uL (ref 0.7–4.0)
Neutro Abs: 5.7 10*3/uL (ref 1.7–7.7)
Neutrophils Relative %: 55 % (ref 43–77)

## 2010-12-27 LAB — CBC
HCT: 33 % — ABNORMAL LOW (ref 36.0–46.0)
MCV: 93.5 fL (ref 78.0–100.0)
Platelets: 251 10*3/uL (ref 150–400)
RBC: 3.53 MIL/uL — ABNORMAL LOW (ref 3.87–5.11)
WBC: 10.5 10*3/uL (ref 4.0–10.5)

## 2010-12-27 LAB — HEPATIC FUNCTION PANEL
ALT: 27 U/L (ref 0–35)
AST: 23 U/L (ref 0–37)
Bilirubin, Direct: 0.1 mg/dL (ref 0.0–0.3)
Total Bilirubin: 0.4 mg/dL (ref 0.3–1.2)

## 2010-12-27 LAB — LIPID PANEL
Total CHOL/HDL Ratio: 5.1 RATIO
Triglycerides: 149 mg/dL (ref <150)
VLDL: 30 mg/dL (ref 0–40)

## 2010-12-27 LAB — URINALYSIS, ROUTINE W REFLEX MICROSCOPIC
Glucose, UA: NEGATIVE mg/dL
pH: 5.5 (ref 5.0–8.0)

## 2010-12-27 LAB — GLUCOSE, CAPILLARY: Glucose-Capillary: 357 mg/dL — ABNORMAL HIGH (ref 70–99)

## 2010-12-27 LAB — DIGOXIN LEVEL: Digoxin Level: 0.5 ng/mL — ABNORMAL LOW (ref 0.8–2.0)

## 2010-12-27 LAB — HEMOGLOBIN A1C: Hgb A1c MFr Bld: 6.8 % — ABNORMAL HIGH (ref <5.7)

## 2010-12-28 LAB — CBC
HCT: 33.6 % — ABNORMAL LOW (ref 36.0–46.0)
Hemoglobin: 10.8 g/dL — ABNORMAL LOW (ref 12.0–15.0)
RBC: 3.6 MIL/uL — ABNORMAL LOW (ref 3.87–5.11)

## 2010-12-28 LAB — GLUCOSE, CAPILLARY
Glucose-Capillary: 384 mg/dL — ABNORMAL HIGH (ref 70–99)
Glucose-Capillary: 315 mg/dL — ABNORMAL HIGH (ref 70–99)
Glucose-Capillary: 160 mg/dL — ABNORMAL HIGH (ref 70–99)

## 2010-12-28 LAB — BASIC METABOLIC PANEL
CO2: 31 mEq/L (ref 19–32)
Chloride: 97 mEq/L (ref 96–112)
GFR calc Af Amer: 57 mL/min — ABNORMAL LOW (ref >60)
Glucose, Bld: 326 mg/dL — ABNORMAL HIGH (ref 70–99)
Sodium: 134 mEq/L — ABNORMAL LOW (ref 135–145)

## 2010-12-28 NOTE — H&P (Signed)
NAME:  Karen [name], Karen [name] NO.:  [id number]  MEDICAL RECORD NO.:  [id number]           PATIENT TYPE:  E  LOCATION:  [facility]                         FACILITY:  [facility]  PHYSICIAN:  Lyn Records, MD      DATE OF BIRTH:  07-Jun-1924  DATE OF ADMISSION:  12/26/2010 DATE OF DISCHARGE:                             HISTORY & PHYSICAL   CHIEF COMPLAINT:  Right-sided numbness.  HISTORY OF PRESENT ILLNESS:  The patient is an 77 year old woman with hypertension, hyperlipidemia, sleep apnea, and severe osteoarthritis who presented with an episode of right-sided face, arm and leg numbness. The patient reports that at 6 p.m., she noted acute onset of right-sided facial numbness that was mostly localized to her upper lip, and she just felt strange and then she had intermittent episodes throughout the day, which in addition to the right side of her face also involved her right arm and right leg and she also reported some subjective weakness to her right arm and right leg.  She does have some general weakness and chronic pain due to osteoarthritis, but this felt different.  At this time, the patient does not have any numbness.  She still feels a little bit weak all over.  She is having cramping in both legs due to laying in bed for so long.  She was just seen by her primary physician last week and was told everything was going great.  Her diabetes was being well controlled.  During this episode, she also had some shortness of breath, which she feels is probably due to anxiety, and she denies any chest pain, PND, orthopnea, or any other specific complaints.  REVIEW OF SYSTEMS:  Review of 10 organ systems was done is negative except as stated above in the HPI.  ALLERGIES:  The patient is allergic to SULFA, VIOXX, CELEBREX, and DOXYCYCLINE.  MEDICATIONS: 1. Hydrochlorothiazide 25 mg daily. 2. Digoxin 0.125 mg half a tab Monday, Wednesday, Friday, and     Saturday. 3. Omeprazole 40 mg  twice a day. 4. Trimethoprim 100 mg daily. 5. Reglan 10 mg 4 times a day. 6. Levothyroxine 125 mcg daily. 7. Cyclobenzaprine 10 mg twice daily as needed. 8. Insulin NPH 10 units before breakfast, 12 units before lunch, 14     units before dinner. 9. Lantus 40 units at bedtime. 10.Cranberry tablets daily. 11.Vitamin E daily. 12.Restasis eyedrops as needed. 13.Vitamin D daily. 14.Vicodin 5/500 one tablets 3 times a day. 15.Xanax 0.5 mg 4 times a day. 16.Sodium chloride nasal spray as needed.  PAST MEDICAL HISTORY:  Hypertension, diabetes, hyperlipidemia, sleep apnea for which patient uses a CPAP intermittently, anxiety, diabetic neuropathy, severe osteoarthritis, GERD, chronic back pain.  PAST SURGICAL HISTORY:  Thyroidectomy, multiple back surgeries, hysterectomy, surgical correction of hammer toe.  SOCIAL HISTORY:  Nonsmoker.  No alcohol.  FAMILY HISTORY:  Father with CVA and heart disease.  Mother with stomach cancer.  PHYSICAL EXAMINATION:  VITAL SIGNS:  On presentation, 176/75, 98.1, 105, 20, 95% on room air. GENERAL:  The patient is in no acute distress.  She is resting comfortably in bed. HEENT:  Mucous membranes are moist.  Sclerae anicteric. CV:  Regular rate and rhythm.  No murmurs, rubs, or gallops. LUNGS:  Clear to auscultation bilaterally.  Nonlabored respiratory effort. ABDOMEN:  Soft, nontender, nondistended. EXTREMITIES:  No lower extremity edema. SKIN:  No rashes. PSYCH:  Normal mood and affect. NEUROLOGIC:  Cranial nerves II-XII are grossly intact.  Her sensation is intact throughout all extremities.  The patient is generally weak in strength throughout all extremities due to severe osteoarthritis, however, the right arm and leg appear to be slightly more weak, although it is difficult to tell how much of this is due to pain and arthritis versus an actual objective weakness.  LABORATORY DATA:  White count 12.2, hemoglobin 11.7, platelets 279.  INR is  1.04.  Sodium 137, potassium 4.2, chloride 100, bicarb 27, glucose 155, BUN 25, creatinine 1.3, CK 149, CK-MB 3.2, troponin less than 0.30. Chest x-ray, enlargement of cardiac silhouette, bronchitic emphysematous changes compatible with COPD.  No acute abnormalities.  Head CT is no acute intracranial abnormalities.  EKG, sinus rhythm.  No acute ischemic changes.  The patient has old Q-waves in V1 and V2.  ASSESSMENT AND PLAN:  The patient is an 77 year old woman with history of hypertension, diabetes, sleep apnea, diabetic neuropathy, severe osteoarthritis who presents with right-sided numbness today. 1. Right-sided numbness.  The symptoms are concerning for a transient     ischemic attack/cerebrovascular accident given her persistent     symptoms and objective findings of slight weakness on the right     side, although the numbness and tingling has been intermittent.     Neuro has been contacted by the emergency room and they have     recommended an admission to Medicine.  We will need to call them in     the morning for an official consult.  We will admit the patient     under the stroke order set and do further workup including a 2-D     echo, carotid Dopplers, MRI of the brain without contrast and order     PT, OT, and aspirin.  At this time, it is unclear the source of the     patient's symptoms, however, she is on digoxin for "heart murmur. "     I am wondering if the patient has had an arrhythmia in the past (AF).     Her current EKG is showing sinus rhythm.  However, we will monitor     on tele for any arrhythmia such as atrial fibrillation. 2. Elevated creatinine.  The patient's last creatinine check was in     2007 and was normal.  This is currently 1.3.  It is unclear if this     is acute kidney injury versus just chronic kidney disease from her     diabetes and hypertension.  We will just continue to monitor and     recheck a creatinine in the morning. 3. We will continue all  the patient's home medications for her     hypertension, recurrent urinary tract infections, diabetes,     anxiety, and gastroesophageal reflux disease.  All of these things     will be ordered for her. 4. Fluid, electrolytes, nutrition, Hep-Lock IV.  Replace electrolytes     as needed.  We will get a swallow study to make sure that the     patient can eat a regular diet. 5. Prophylaxis.  We will put the patient on Lovenox for DVT     prophylaxis as her head CT does not show any  signs of a bleed.  DISPOSITION:  The patient will be admitted for further workup with a neuro consult in the morning.          ______________________________ Lyn Records, MD     JF/MEDQ  D:  12/27/2010  T:  12/27/2010  Job:  [id number]  Electronically Signed by Lyn Records MD on 12/28/2010 06:12:08 PM

## 2011-02-04 NOTE — Discharge Summary (Signed)
NAME:  Karen [name], Karen [name] NO.:  [id number]  MEDICAL RECORD NO.:  [id number]           PATIENT TYPE:  I  LOCATION:  [id number]                         FACILITY:  [facility]  PHYSICIAN:  Charlynne Cousins, M.D.DATE OF BIRTH:  [birth year]  DATE OF ADMISSION:  12/26/2010 DATE OF DISCHARGE:  12/28/2010                              DISCHARGE SUMMARY   PRIMARY CARE PHYSICIAN:  Viviann Spare. Nyoka Cowden, MD  DISCHARGE DIAGNOSES: 1. Suspected transient ischemic attack with right-sided numbness on     admission. 2. Acute kidney injury. 3. Type 2 diabetes. 4. Hypertension. 5. Chronic gait instability. 6. Hypertension. 7. Hyperlipidemia. 8. Sleep apnea. 9. Anxiety. 10.Diabetic neuropathy. 11.Osteoarthritis. 12.Gastroesophageal reflux disease. 13.Chronic back pain.  DISCHARGE MEDICATIONS: 1. Aspirin 325 mg p.o. daily (new medication). 2. Cyclobenzaprine 10 mg p.o. b.i.d. p.r.n. muscle spasms. 3. Cranberry tab over-the-counter p.o. daily. 4. Digoxin 0.125 mg half tab p.o. daily. 5. Hydrochlorothiazide 25 mg p.o. daily. 6. Lantus insulin 48 units subcu at bedtime. 7. Levothroid 125 mcg p.o. daily. 8. NovoLog insulin 10 units subcu prior to breakfast, 12 units subcu     prior to lunch, 14 units subcu prior to dinner. 9. Omeprazole 40 mg p.o. b.i.d. 10.Reglan 10 mg p.o. q.i.d. 11.Restasis 1 drop both eyes at bedtime p.r.n. 12.Trimethoprim 100 mg p.o. daily. 13.Vicodin 5/500, 1 tab p.o. t.i.d. 14.Vitamin D 5000 international units p.o. daily. 15.Vitamin E over-the-counter 1 capsule p.o. daily. 16.Xanax 0.5 mg p.o. q.i.d.  CONSULTATIONS DURING HOSPITALIZATION:  None.  PROCEDURES DURING HOSPITALIZATION: 1. CT of the head performed Dec 26, 2010 showing no acute     abnormalities. 2. Chest x-ray performed Dec 26, 2010 showing enlarged cardiac     silhouette and consistent with changes of COPD. 3. MRI of brain performed Dec 27, 2010 showing no acute infarct and     age-related  atrophy. 4. Carotid Doppler studies performed Dec 27, 2010 with no evidence of     ICA stenosis. 5. A 2-D echocardiogram performed Dec 28, 2010, results are currently     pending.  BRIEF HISTORY OF PRESENT ILLNESS:  Please see complete H&P for complete details.  In short, Karen [name] is an 77 year old female with past medical history of hypertension, hyperlipidemia, and type 2 diabetes who presented to [facility] Emergency Department with complaints of right- sided facial numbness as well as arm and leg numbness.  The patient stated that symptoms began just prior to arrival in the emergency department and had resolved upon admission evaluation.  The patient did state she felt somewhat weak all over.  The patient was admitted at that time to rule out CVA versus TIA.  Apparently during admission evaluation, the patient was also found to have some mild right upper and lower extremity weakness that was felt to be slightly worse than chronic weakness due to osteoarthritis.  Due to this, the patient was admitted for full CVA workup.  COURSE OF HOSPITALIZATION: 1. Suspected TIA.  Again the patient with multiple risk factors for     TIA including hypertension, hyperlipidemia, and type 2 diabetes.     The patient did undergo full CVA workup including procedures as  listed above which were all negative for stroke.  At this time, the     patient is undergoing a 2-D echocardiogram, results will be pending     at the time of discharge and to be followed up by primary care     physician.  The patient's symptoms have currently resolved.  The     patient's diabetes found to be very well controlled with a     hemoglobin A1c of 6.8% . In addition, the patient's lipid panel was     within normal limits and blood pressure within normal limits.  We     will place the patient on full-dose aspirin therapy and asked that     she follow up with primary care physician. 2. Acute kidney injury.  The  patient's renal function was 1.3 at time     of admission and down to 1.11 after gentle IV fluids were     administered in the emergency department.  The patient will be     restarted on her HCTZ at time of discharge to be followed closely     by primary care physician. 3. Type 2 diabetes.  Again, hemoglobin A1c 6.8%.  We will continue the     patient on her home regimen at time of dictation attempting to     bring down the patient's blood sugar as the patient was not given     her usual dose of NovoLog insulin for breakfast or lunch today.  We     will stabilize blood glucose prior to discharge home. 4. Chronic gait instability.  The patient did undergo evaluation by     physical and occupational therapy during hospitalization per stroke     protocol.  The patient's gait instability felt to be chronic in     nature and unrelated to TIA.  Prior to this admission, the patient     was using a 4-pronged cane and using furniture around the house for     stabilization.  The patient is to be discharged home with rolling     walker per physical therapy recommendations as well as home PT     visit for safety evaluation.  PERTINENT LABS DURING HOSPITALIZATION:  White cell count 10.4, platelet count 248,000, hemoglobin 10.8, hematocrit 33.6.  Sodium 134, potassium 4.4, BUN 21, creatinine 1.11.  Hemoglobin A1c 6.8%.  Digoxin level 0.5.  Total cholesterol 154, triglycerides 149, HDL 30, LDL 94.  DISPOSITION:  The patient is felt medically stable for discharge home once blood sugar is stabilized.  Again the patient will be discharged home with physical therapy, safety evaluation as well as rolling walker. The patient has been scheduled for followup with [facility] on Monday, June 4 at 11:45 a.m. at which time 2-D echo results can be reviewed.     Karen Ranks, NP   ______________________________ Charlynne Cousins, M.D.    LE/MEDQ  D:  12/28/2010  T:  12/29/2010   Job:  [id number]  cc:   Viviann Spare Nyoka Cowden, M.D.  Electronically Signed by Karen Ranks NP on 02/04/2011 01:39:39 PM Electronically Signed by Charlynne Cousins M.D. on 02/04/2011 02:30:45 PM

## 2011-12-22 LAB — HM MAMMOGRAPHY

## 2012-01-20 ENCOUNTER — Other Ambulatory Visit: Payer: Self-pay | Admitting: Nurse Practitioner

## 2012-01-20 ENCOUNTER — Ambulatory Visit
Admission: RE | Admit: 2012-01-20 | Discharge: 2012-01-20 | Disposition: A | Discharge status: Home / Self Care | Payer: Medicare Other | Source: Ambulatory Visit | Attending: Nurse Practitioner | Admitting: Nurse Practitioner

## 2012-01-20 DIAGNOSIS — R197 Diarrhea, unspecified: Secondary | ICD-10-CM

## 2012-01-20 DIAGNOSIS — R1032 Left lower quadrant pain: Secondary | ICD-10-CM

## 2012-01-20 DIAGNOSIS — R1012 Left upper quadrant pain: Secondary | ICD-10-CM

## 2012-01-20 MED ORDER — IOHEXOL 300 MG/ML  SOLN
100.0000 mL | Freq: Once | INTRAMUSCULAR | Status: AC | PRN
  Administered 2012-01-20: 100 mL via INTRAVENOUS

## 2012-01-20 MED ORDER — IOHEXOL 300 MG/ML  SOLN
30.0000 mL | Freq: Once | INTRAMUSCULAR | Status: AC | PRN
  Administered 2012-01-20: 30 mL via ORAL

## 2012-04-24 ENCOUNTER — Ambulatory Visit (INDEPENDENT_AMBULATORY_CARE_PROVIDER_SITE_OTHER): Payer: Medicare Other

## 2012-04-24 DIAGNOSIS — M79609 Pain in unspecified limb: Secondary | ICD-10-CM

## 2012-05-07 ENCOUNTER — Other Ambulatory Visit: Payer: Self-pay

## 2012-05-07 DIAGNOSIS — I739 Peripheral vascular disease, unspecified: Secondary | ICD-10-CM

## 2012-06-01 ENCOUNTER — Encounter: Payer: Medicare Other | Admitting: Vascular Surgery

## 2012-06-08 ENCOUNTER — Encounter: Payer: Self-pay | Admitting: Vascular Surgery

## 2012-06-20 ENCOUNTER — Encounter: Payer: Self-pay | Admitting: Vascular Surgery

## 2012-06-21 ENCOUNTER — Ambulatory Visit (INDEPENDENT_AMBULATORY_CARE_PROVIDER_SITE_OTHER): Payer: Medicare Other | Admitting: Vascular Surgery

## 2012-06-21 ENCOUNTER — Encounter: Payer: Self-pay | Admitting: Vascular Surgery

## 2012-06-21 ENCOUNTER — Encounter (INDEPENDENT_AMBULATORY_CARE_PROVIDER_SITE_OTHER): Payer: Medicare Other | Admitting: *Deleted

## 2012-06-21 VITALS — BP 152/55 | HR 78 | Ht 65.0 in | Wt 198.7 lb

## 2012-06-21 DIAGNOSIS — I739 Peripheral vascular disease, unspecified: Secondary | ICD-10-CM

## 2012-06-21 NOTE — Progress Notes (Signed)
[facility] HISTORY AND PHYSICAL   History of Present Illness:  Patient is a 78 y.o. year old female who presents for evaluation of bilateral lower extremity pain.  The patient states the pain has been going on for years but slowly progressively worse. She has pain that occurs from the hips down with standing sitting and sometimes walking. She also has some occasional numbness and tingling. She has had 2 prior back operations. She had been told previously by Dr. Percell Miller that her leg pain was secondary to back problems. She was offered a third back operation but decided to not go through with that. She does not smoke and has not smoked in the past. She does have a history of diabetes. She also had a TIA in the past.  She also has a history of cardiac arrhythmia and possible atrial fibrillation.all of these problems are currently stable and followed by Dr. Nyoka Cowden. She denies rest pain or nonhealing ulcers.  Past Medical History  Diagnosis Date  . Anemia   . Diabetes mellitus without complication   . Stroke   . Irregular heartbeat   . Sleep apnea   . Arthritis     body, neck  . TIA (transient ischemic attack) 2012  . Acid reflux   . Bladder disorder     over active  . Cancer     skin, removed  . Hiatal hernia     Past Surgical History  Procedure Date  . Back surgery   . Abdominal hysterectomy   . Thyroidectomy, partial 1965  . Cholecystectomy 1991  . Hammer toes   . Eye surgery 1994, 1997    bilateral cataract  . Hernia repair 1984aaaaaaaa  . Skin cancer destruction      Social History History  Substance Use Topics  . Smoking status: Never Smoker   . Smokeless tobacco: Never Used  . Alcohol Use: No    Family History Family History  Problem Relation Age of Onset  . Cancer Mother   . Heart disease Father   . Hyperlipidemia Father   . Hypertension Father   . Heart attack Father     Allergies  Allergies  Allergen Reactions  . Celebrex  (Celecoxib) Rash  . Doxycycline Rash  . Lyrica (Pregabalin) Rash     Current Outpatient Prescriptions  Medication Sig Dispense Refill  . ALPRAZolam (XANAX) 0.5 MG tablet Take 1 tablet by mouth. Up to three times daily      . aspirin 325 MG tablet Take 325 mg by mouth daily.      Marland Kitchen CRANBERRY PO Take 300 mg by mouth daily.      . digoxin (LANOXIN) 0.125 MG tablet Take 1 tablet by mouth. Take on Monday, Wednesday, Friday      . diphenhydrAMINE-zinc acetate (BENADRYL ITCH STOPPING) cream Apply 1 application topically 3 (three) times daily as needed.      . ergocalciferol (VITAMIN D2) 50000 UNITS capsule Take 50,000 Units by mouth once a week.      . hydrochlorothiazide (HYDRODIURIL) 25 MG tablet Take 1 tablet by mouth daily.      Marland Kitchen LANTUS SOLOSTAR 100 UNIT/ML injection Inject 40 Units into the skin daily.      Marland Kitchen levothyroxine (SYNTHROID, LEVOTHROID) 100 MCG tablet Take 1 tablet by mouth daily.      . metoCLOPramide (REGLAN) 10 MG tablet Take 1 tablet by mouth daily.      Marland Kitchen NOVOLOG FLEXPEN 100 UNIT/ML injection       .  omeprazole (PRILOSEC) 40 MG capsule Take 40 mg by mouth daily. Two tablets daily      . promethazine (PHENERGAN) 25 MG tablet Take 25 mg by mouth every 6 (six) hours as needed.      . trimethoprim (TRIMPEX) 100 MG tablet Take 1 tablet by mouth daily.      Marland Kitchen VITAMIN E PO Take 400 Units by mouth daily.        ROS:   General:  No weight loss, Fever, chills  HEENT: No recent headaches, no nasal bleeding, no visual changes, no sore throat  Neurologic: No dizziness, blackouts, seizures. No recent symptoms of stroke or mini- stroke. No recent episodes of slurred speech, or temporary blindness.  Cardiac: No recent episodes of chest pain/pressure, no shortness of breath at rest.  No shortness of breath with exertion.  Vascular: No history of rest pain in feet.  No history of claudication.  No history of non-healing ulcer, No history of DVT   Pulmonary: No home oxygen, no  productive cough, no hemoptysis,  No asthma or wheezing  Musculoskeletal:  [ x] Arthritis, [x ] Low back pain,  [x ] Joint pain  Hematologic:No history of hypercoagulable state.  No history of easy bleeding.  No history of anemia  Gastrointestinal: No hematochezia or melena,  No gastroesophageal reflux, no trouble swallowing  Urinary: [ ]  chronic Kidney disease, [ ]  on HD - [ ]  MWF or [ ]  TTHS, [ ]  Burning with urination, [ ]  Frequent urination, [ ]  Difficulty urinating;   Skin: No rashes  Psychological: No history of anxiety,  No history of depression   Physical Examination  Filed Vitals:   06/21/12 1126  BP: 152/55  Pulse: 78  Height: 5\' 5"  (1.651 m)  Weight: 198 lb 11.2 oz (90.13 kg)  SpO2: 100%    Body mass index is 33.07 kg/(m^2).  General:  Alert and oriented, no acute distress HEENT: Normal Neck: No bruit or JVD Pulmonary: Clear to auscultation bilaterally Cardiac: Regular Rate and Rhythm without murmur Abdomen: Soft, non-tender, non-distended, no mass Skin: No rash, feet pink and warm Extremity Pulses:  2+ radial, brachial, femoral, 1+ dorsalis pedis, absent posterior tibial pulses bilaterally Musculoskeletal: No deformity or edema  Neurologic: Upper and lower extremity motor 5/5 and symmetric  DATA: The patient had lateral ABIs performed in September 24 which I reviewed today. This showed an ABI on the right of 0.8 left 0.85. She also had an arterial duplex exam today which I reviewed and interpreted. This showed patent lower extremity arterial tree bilaterally with some decreased velocity suggestive of mild external iliac artery stenosis.   ASSESSMENT: Bilateral mild peripheral arterial disease. She does not exhibit symptoms of claudication. She is mainly debilitated by her back problems. Although she may have some mild external iliac artery stenosis she currently does not have limb threatening occlusive disease. I do not believe any intervention with her current  level of ABIs would increase her walking distance significantly due to the fact that her back is her primary source of disability. I reassured her that she does have adequate perfusion to her lower extremities.   PLAN:  She will followup on as-needed basis if she develops nonhealing wounds of her feet or progressive tightness in her calves with ambulation.  Ruta Hinds, MD [facility] Office: [phone] Pager: [phone]

## 2012-08-29 ENCOUNTER — Ambulatory Visit: Payer: Medicare Other | Admitting: Internal Medicine

## 2012-09-24 ENCOUNTER — Encounter: Payer: Self-pay | Admitting: Internal Medicine

## 2012-09-24 ENCOUNTER — Ambulatory Visit (INDEPENDENT_AMBULATORY_CARE_PROVIDER_SITE_OTHER): Payer: Medicare Other | Admitting: Internal Medicine

## 2012-09-24 VITALS — BP 160/60 | HR 100 | Ht 64.0 in | Wt 141.4 lb

## 2012-09-24 DIAGNOSIS — R1310 Dysphagia, unspecified: Secondary | ICD-10-CM

## 2012-09-24 DIAGNOSIS — K219 Gastro-esophageal reflux disease without esophagitis: Secondary | ICD-10-CM

## 2012-09-24 DIAGNOSIS — K222 Esophageal obstruction: Secondary | ICD-10-CM

## 2012-09-24 DIAGNOSIS — E1059 Type 1 diabetes mellitus with other circulatory complications: Secondary | ICD-10-CM

## 2012-09-24 NOTE — Progress Notes (Signed)
HISTORY OF PRESENT ILLNESS:  Karen [name] is a 79 y.o. female with multiple medical problems as outlined below. She was last evaluated in 2009 regarding dysphagia with regurgitation and vomiting. She subsequently underwent upper endoscopy and was found to have a distal esophageal stricture which was dilated with a 54 [place] Maloney dilator. She reports marked improvement in swallowing thereafter until recent months when she has had recurrent problems with solids and occasionally liquids. Her multiple chronic medical problems are reported as stable. She continues on insulin for her diabetes. As well, omeprazole for a history of GERD.  REVIEW OF SYSTEMS:  All non-GI ROS negative except for anxiety, arthritis, back pain, fatigue, muscle pains, night sweats, heart rhythm change, ankle edema, excessive urination, urinary frequency, urinary leakage  Past Medical History  Diagnosis Date  . Anemia   . Diabetes mellitus without complication   . Stroke   . Irregular heartbeat   . Sleep apnea   . Arthritis     body, neck  . TIA (transient ischemic attack) 2012  . Acid reflux   . Bladder disorder     over active  . Cancer     skin, removed  . Hiatal hernia   . Benign hypertensive heart disease   . CHF (congestive heart failure)   . Hyperkalemia   . Vitamin B12 deficiency   . Gastroparesis   . Hyperlipidemia   . Anxiety   . A-fib   . Hypothyroidism   . Spinal stenosis   . GERD (gastroesophageal reflux disease)   . Esophageal stricture     Past Surgical History  Procedure Laterality Date  . Back surgery      x 2  . Abdominal hysterectomy    . Thyroidectomy, partial  1965  . Cholecystectomy  1991  . Hammer toes    . Eye surgery  1994, 1997    bilateral cataract  . Hernia repair  1984aaaaaaaa  . Skin cancer destruction      Social History Karen [name]  reports that she has never smoked. She has never used smokeless tobacco. She reports that she does not drink  alcohol or use illicit drugs.  family history includes Cancer in her mother; Heart attack in her father; Heart disease in her father; Hyperlipidemia in her father; and Hypertension in her father.  Allergies  Allergen Reactions  . Celebrex (Celecoxib) Rash  . Doxycycline Rash  . Lyrica (Pregabalin) Rash  . Avandia (Rosiglitazone)   . Macrodantin (Nitrofurantoin Macrocrystal)   . Sulfa Antibiotics   . Vioxx (Rofecoxib)        PHYSICAL EXAMINATION: Vital signs: BP 160/60  Pulse 100  Ht 5\' 4"  (1.626 m)  Wt 141 lb 6.4 oz (64.139 kg)  BMI 24.26 kg/m2 General: Well-developed, well-nourished, no acute distress. Thin HEENT: Sclerae are anicteric, conjunctiva pink. Oral mucosa intact Lungs: Clear Heart: Regular Abdomen: soft, nontender, nondistended, no obvious ascites, no peritoneal signs, normal bowel sounds. No organomegaly. Extremities: No edema Psychiatric: alert and oriented x3. Cooperative   ASSESSMENT:  #1. Recurrent dysphagia likely secondary to known peptic stricture #2. GERD with history peptic stricture #3. Multiple medical problems including insulin requiring diabetes   PLAN:  #1. Continue PPI #2. Upper endoscopy with esophageal dilation. The patient is high-risk given her age and comorbidities.The nature of the procedure, as well as the risks, benefits, and alternatives were carefully and thoroughly reviewed with the patient. Ample time for discussion and questions allowed. The patient understood, was satisfied, and agreed to  proceed. #3. Adjust insulin for the procedure. Instructed to take one half per evening Lantus and hold her a.m. NovoLog. We will schedule her for a morning appointment. We can monitor her blood sugar immediately before and after her procedure. #4. Ongoing general medical care with Dr. Jeanmarie Hubert

## 2012-09-24 NOTE — Patient Instructions (Addendum)
You have been scheduled for an endoscopy with propofol. Please follow written instructions given to you at your visit today. If you use inhalers (even only as needed) or a CPAP machine, please bring them with you on the day of your procedure. 

## 2012-10-08 ENCOUNTER — Ambulatory Visit (AMBULATORY_SURGERY_CENTER): Payer: Medicare Other | Admitting: Internal Medicine

## 2012-10-08 ENCOUNTER — Other Ambulatory Visit: Payer: Self-pay | Admitting: Internal Medicine

## 2012-10-08 ENCOUNTER — Encounter: Payer: Self-pay | Admitting: Internal Medicine

## 2012-10-08 VITALS — BP 157/72 | HR 82 | Temp 96.8°F | Resp 23 | Ht 64.0 in | Wt 141.0 lb

## 2012-10-08 MED ORDER — SODIUM CHLORIDE 0.9 % IV SOLN: 500.0000 mL | INTRAVENOUS | Status: DC

## 2012-10-08 NOTE — Progress Notes (Signed)
Called to room to assist during endoscopic procedure.  Patient ID and intended procedure confirmed with present staff. Received instructions for my participation in the procedure from the performing physician.  

## 2012-10-08 NOTE — Patient Instructions (Signed)
YOU HAD AN ENDOSCOPIC PROCEDURE TODAY AT [facility]: Refer to the procedure report that was given to you for any specific questions about what was found during the examination.  If the procedure report does not answer your questions, please call your gastroenterologist to clarify.  If you requested that your care partner not be given the details of your procedure findings, then the procedure report has been included in a sealed envelope for you to review at your convenience later.  YOU SHOULD EXPECT: Some feelings of bloating in the abdomen. Passage of more gas than usual.  Walking can help get rid of the air that was put into your GI tract during the procedure and reduce the bloating. If you had a lower endoscopy (such as a colonoscopy or flexible sigmoidoscopy) you may notice spotting of blood in your stool or on the toilet paper. If you underwent a bowel prep for your procedure, then you may not have a normal bowel movement for a few days.  DIET: follow dilatation diet given to you today , liquids only until 12 noon today , then soft foods only the rest of today, handout given to you  ACTIVITY: Your care partner should take you home directly after the procedure.  You should plan to take it easy, moving slowly for the rest of the day.  You can resume normal activity the day after the procedure however you should NOT DRIVE or use heavy machinery for 24 hours (because of the sedation medicines used during the test).    SYMPTOMS TO REPORT IMMEDIATELY: A gastroenterologist can be reached at any hour.  During normal business hours, 8:30 AM to 5:00 PM Monday through Friday, call [phone].  After hours and on weekends, please call the GI answering service at [phone] who will take a message and have the physician on call contact you.     Following upper endoscopy (EGD)  Vomiting of blood or coffee ground material  New chest pain or pain under the shoulder blades  Painful or  persistently difficult swallowing  New shortness of breath  Fever of 100F or higher  Black, tarry-looking stools  FOLLOW UP: If any biopsies were taken you will be contacted by phone or by letter within the next 1-3 weeks.  Call your gastroenterologist if you have not heard about the biopsies in 3 weeks.  Our staff will call the home number listed on your records the next business day following your procedure to check on you and address any questions or concerns that you may have at that time regarding the information given to you following your procedure. This is a courtesy call and so if there is no answer at the home number and we have not heard from you through the emergency physician on call, we will assume that you have returned to your regular daily activities without incident.  SIGNATURES/CONFIDENTIALITY: You and/or your care partner have signed paperwork which will be entered into your electronic medical record.  These signatures attest to the fact that that the information above on your After Visit Summary has been reviewed and is understood.  Full responsibility of the confidentiality of this discharge information lies with you and/or your care-partner.  Follow dilatation diet  Continue anti reflux medication (PPI)

## 2012-10-08 NOTE — Progress Notes (Signed)
Patient did not experience any of the following events: a burn prior to discharge; a fall within the facility; wrong site/side/patient/procedure/implant event; or a hospital transfer or hospital admission upon discharge from the facility. (G8907) Patient did not have preoperative order for IV antibiotic SSI prophylaxis. (G8918)  

## 2012-10-08 NOTE — Op Note (Signed)
[place]  [address] [place], [id number]   ENDOSCOPY PROCEDURE REPORT  PATIENT: Karen, [name]  MR#: [id number] BIRTHDATE: November 07, 1923 , 88  yrs. old GENDER: Female ENDOSCOPIST: Eustace Quail, MD REFERRED BY:  .  Self / Office PROCEDURE DATE:  10/08/2012 PROCEDURE:  EGD, diagnostic and Maloney dilation of esophagus 48F ASA CLASS:     Class II INDICATIONS:  Dysphagia.   Therapeutic procedure. MEDICATIONS: MAC sedation, administered by CRNA and propofol (Diprivan) 60mg  IV TOPICAL ANESTHETIC: Cetacaine Spray  DESCRIPTION OF PROCEDURE: After the risks benefits and alternatives of the procedure were thoroughly explained, informed consent was obtained.  The [facility] [id number] endoscope was introduced through the mouth and advanced to the second portion of the duodenum. Without limitations.  The instrument was slowly withdrawn as the mucosa was fully examined.      EXAM:Distal esophageal ring-like stricture measuring 53mm. Otherwise normal EGD.  Retroflexed views revealed no abnormalities. The scope was then withdrawn from the patient and the procedure completed.  THERAPY: 48F MALONEYDILATOR PASSED WITHOUT RESISTANCE OR HEME. TOLERATED WELL.  COMPLICATIONS: There were no complications. ENDOSCOPIC IMPRESSION: 1. Distal esophageal ring-like stricture measuring 50mm. S/P Dilation 2.  Otherwise normal EGD.  RECOMMENDATIONS: 1.  Clear liquids until 12 noon , then soft foods rest of day. Resume prior diet tomorrow. 2.  Continue PPI (reflux medication)  REPEAT EXAM:  eSigned:  Eustace Quail, MD 10/08/2012 9:55 AM   [id number] Nyoka Cowden, MD and The Patient

## 2012-10-09 ENCOUNTER — Encounter: Payer: Self-pay | Admitting: Gastroenterology

## 2012-10-09 ENCOUNTER — Ambulatory Visit (INDEPENDENT_AMBULATORY_CARE_PROVIDER_SITE_OTHER): Payer: Medicare Other | Admitting: Gastroenterology

## 2012-10-09 ENCOUNTER — Telehealth: Payer: Self-pay

## 2012-10-09 ENCOUNTER — Ambulatory Visit (INDEPENDENT_AMBULATORY_CARE_PROVIDER_SITE_OTHER)
Admission: RE | Admit: 2012-10-09 | Discharge: 2012-10-09 | Disposition: A | Discharge status: Home / Self Care | Payer: Medicare Other | Source: Ambulatory Visit | Attending: Gastroenterology | Admitting: Gastroenterology

## 2012-10-09 VITALS — BP 112/64 | HR 66 | Ht 64.0 in | Wt 136.0 lb

## 2012-10-09 DIAGNOSIS — R1310 Dysphagia, unspecified: Secondary | ICD-10-CM | POA: Insufficient documentation

## 2012-10-09 NOTE — Telephone Encounter (Signed)
Spoke with pt and she states this is similar to the problems she was having before her procedure yesterday. Pt reports that she has just now kept a little water down but it hurts quite a bit when she swallows. States she has not been able to keep any food down. Pt scheduled to see Karen Bogus PA today at 3:30pm. Pt aware of appt date and time.

## 2012-10-09 NOTE — Telephone Encounter (Signed)
Left message on answering machine. 

## 2012-10-09 NOTE — Telephone Encounter (Signed)
I am not sure what this means. Karen [name], please get more history. Is this something new or similar to problems that she has had in the past, as documented. If she feels that this is new or severe, she is welcome to be evaluated by an extender. I do not feel that this is necessarily a side effect of her procedure. Thanks

## 2012-10-09 NOTE — Progress Notes (Signed)
I saw and examined the patient with the physician assistant. The patient's son was in the room. Not clear to me that she is having any new problems. Agree with chest x-ray to rule out any evidence of aspiration. Chronic dysphagia, particularly to liquids, maybe dysmotility. Janett Billow will followup on the chest x-ray.

## 2012-10-09 NOTE — Telephone Encounter (Signed)
Pt can not keep liquids down.  Since the procedure anything she drinks comes back up immediately.  MD advised.

## 2012-10-09 NOTE — Patient Instructions (Addendum)
Please go directly to the basement level to Radiology department in our [place] building for your chest x-ray.   Follow up with Dr. Henrene Pastor as needed.

## 2012-10-09 NOTE — Progress Notes (Signed)
10/09/2012 Karen [name] [id number] 05-Jan-1924   History of Present Illness:  Patient is a pleasant 79 year old female who is a patient of Dr. Blanch Media.  She was having dysphagia and underwent EGD yesterday.  She was found to have a distal ring-like esophageal stricture, which was dilated.  Today she called with similar complaints of dysphagia, but her symptoms were somewhat ambiguous so she was brought to the office for further evaluation.  She says that she was able to get mashed potatoes and a frosty to go down, but once they went down it caused some discomfort in her chest.  Is not able to get thin liquids such as water or ginger ale to go down.  She says that she feels a little more SOB than normal.  Current Medications, Allergies, Past Medical History, Past Surgical History, Family History and Social History were reviewed in [employer] record.   Physical Exam: BP 112/64  Pulse 66  Ht 5\' 4"  (1.626 m)  Wt 136 lb (61.689 kg)  BMI 23.33 kg/m2 General: Elderly, white female in no acute distress Head: Normocephalic and atraumatic Eyes:  sclerae anicteric, conjunctiva pink  Ears: Normal auditory acuity Lungs: Clear throughout to auscultation Heart: Regular rate and rhythm Abdomen: Soft, non-tender and non-distended. No masses, no hepatomegaly. Normal bowel sounds. Musculoskeletal: Symmetrical with no gross deformities  Extremities: No edema  Neurological: Alert oriented x 4, grossly nonfocal Psychological:  Alert and cooperative. Normal mood and affect  Assessment and Recommendations: -Dysphagia:  Similar to previous symptoms.  ? Some worsened SOB.  Will check 2 view chest xray today to rule out aspiration PNA.  If chest x-ray is ok, then advised patient to give more time to allow inflammation to go down from dilation.  She will follow-up if dysphagia persists.  May need swallow evaluation for dysmotility if she is seen back for liquid dysphagia in the  future.

## 2012-10-25 ENCOUNTER — Other Ambulatory Visit: Payer: Self-pay | Admitting: *Deleted

## 2012-10-25 DIAGNOSIS — L299 Pruritus, unspecified: Secondary | ICD-10-CM

## 2012-10-25 DIAGNOSIS — I10 Essential (primary) hypertension: Secondary | ICD-10-CM

## 2012-10-25 DIAGNOSIS — E1129 Type 2 diabetes mellitus with other diabetic kidney complication: Secondary | ICD-10-CM

## 2012-11-05 ENCOUNTER — Other Ambulatory Visit: Payer: Medicare Other

## 2012-11-05 DIAGNOSIS — L299 Pruritus, unspecified: Secondary | ICD-10-CM

## 2012-11-05 DIAGNOSIS — I10 Essential (primary) hypertension: Secondary | ICD-10-CM

## 2012-11-05 DIAGNOSIS — E1129 Type 2 diabetes mellitus with other diabetic kidney complication: Secondary | ICD-10-CM

## 2012-11-06 LAB — BASIC METABOLIC PANEL
BUN/Creatinine Ratio: 17 (ref 11–26)
Chloride: 101 mmol/L (ref 97–108)
GFR calc Af Amer: 46 mL/min/{1.73_m2} — ABNORMAL LOW (ref >59)
GFR calc non Af Amer: 40 mL/min/{1.73_m2} — ABNORMAL LOW (ref >59)
Potassium: 5.2 mmol/L (ref 3.5–5.2)

## 2012-11-06 LAB — MICROALBUMIN / CREATININE URINE RATIO
Creatinine, Ur: 132 mg/dL (ref 15.0–278.0)
Microalbumin, Urine: 5.9 ug/mL (ref 0.0–17.0)

## 2012-11-07 ENCOUNTER — Ambulatory Visit (INDEPENDENT_AMBULATORY_CARE_PROVIDER_SITE_OTHER): Payer: Medicare Other | Admitting: Internal Medicine

## 2012-11-07 ENCOUNTER — Encounter: Payer: Self-pay | Admitting: Geriatric Medicine

## 2012-11-07 ENCOUNTER — Encounter: Payer: Self-pay | Admitting: Internal Medicine

## 2012-11-07 VITALS — BP 160/70 | HR 90 | Temp 97.9°F | Resp 18 | Ht 64.0 in | Wt 138.0 lb

## 2012-11-07 DIAGNOSIS — F411 Generalized anxiety disorder: Secondary | ICD-10-CM

## 2012-11-07 DIAGNOSIS — K219 Gastro-esophageal reflux disease without esophagitis: Secondary | ICD-10-CM

## 2012-11-07 DIAGNOSIS — E1129 Type 2 diabetes mellitus with other diabetic kidney complication: Secondary | ICD-10-CM

## 2012-11-07 DIAGNOSIS — E039 Hypothyroidism, unspecified: Secondary | ICD-10-CM | POA: Insufficient documentation

## 2012-11-07 DIAGNOSIS — I10 Essential (primary) hypertension: Secondary | ICD-10-CM

## 2012-11-07 DIAGNOSIS — E119 Type 2 diabetes mellitus without complications: Secondary | ICD-10-CM

## 2012-11-07 DIAGNOSIS — F419 Anxiety disorder, unspecified: Secondary | ICD-10-CM

## 2012-11-07 DIAGNOSIS — L821 Other seborrheic keratosis: Secondary | ICD-10-CM

## 2012-11-07 DIAGNOSIS — M4800 Spinal stenosis, site unspecified: Secondary | ICD-10-CM

## 2012-11-07 DIAGNOSIS — L299 Pruritus, unspecified: Secondary | ICD-10-CM

## 2012-11-07 MED ORDER — DESOXIMETASONE 0.25 % EX CREA: TOPICAL_CREAM | CUTANEOUS | Status: DC

## 2012-11-07 NOTE — Progress Notes (Signed)
Subjective:     Patient ID: Karen [name], female   DOB: 13-Mar-1924, 79 y.o.   MRN: [id number]  HPI   Review of Systems     Objective:   Physical Exam     Assessment:     .     Plan:     .

## 2012-11-07 NOTE — Patient Instructions (Signed)
Continue current medications. We will discuss preventive medicine tests at your next visit. Bring you list back with you.

## 2012-11-26 ENCOUNTER — Other Ambulatory Visit: Payer: Self-pay | Admitting: *Deleted

## 2012-11-26 MED ORDER — DIGOXIN 125 MCG PO TABS: ORAL_TABLET | ORAL | Status: DC

## 2012-12-05 ENCOUNTER — Other Ambulatory Visit: Payer: Self-pay | Admitting: Geriatric Medicine

## 2012-12-05 MED ORDER — HYDROCODONE-ACETAMINOPHEN 5-325 MG PO TABS: 1.0000 | ORAL_TABLET | Freq: Four times a day (QID) | ORAL | Status: DC

## 2012-12-17 ENCOUNTER — Telehealth: Payer: Self-pay | Admitting: Internal Medicine

## 2012-12-17 DIAGNOSIS — M48061 Spinal stenosis, lumbar region without neurogenic claudication: Secondary | ICD-10-CM | POA: Insufficient documentation

## 2012-12-17 DIAGNOSIS — L299 Pruritus, unspecified: Secondary | ICD-10-CM | POA: Insufficient documentation

## 2012-12-17 DIAGNOSIS — I10 Essential (primary) hypertension: Secondary | ICD-10-CM | POA: Insufficient documentation

## 2012-12-17 NOTE — Progress Notes (Signed)
Subjective:    Patient ID: Karen [name], female    DOB: [birth year], 79 y.o.   MRN: [id number]  HPI   ICD-9-CM   1. Acid reflux 530.81 Using omeprazole from time to time  2. Anxiety 300.00 Financially insecure since the death of her husband. She is quite anxious about this.  3. Hypothyroidism 244.9 Controlled  4. Seborrheic keratosis 702.19 Lesion on left breast  5. Type II or unspecified type diabetes mellitus without mention of complication, not stated as uncontrolled 250.00 Hemoglobin A1c    Lipid panel    Basic Metabolic Panel  6. Type II or unspecified type diabetes mellitus with renal manifestations, not stated as uncontrolled(250.40) 250.40  this diagnosis should be used in the future  7. Unspecified essential hypertension 401.9 Controlled  8. Unspecified pruritic disorder 698.9 Generalized itching. No rash.  9. Spinal stenosis, unspecified region other than cervical 724.00 Chronic back pain and loss of ability related to lumbar pain    Review of Systems  Constitutional: Positive for fatigue. Negative for fever, chills, activity change, appetite change and unexpected weight change.  HENT: Negative.   Eyes: Negative.   Respiratory: Positive for shortness of breath. Negative for wheezing.   Cardiovascular: Negative for chest pain, palpitations and leg swelling.       Bilateral mild peripheral arterial disease. She has been evaluated by Dr. Ruta Hinds, vascular surgery, on 06/21/12. He felt that she had bilateral mild peripheral arterial disease. Leg discomfort seems to be more related to her chronic back problems. She has mild external iliac artery stenosis, but does not have lymphedema occlusive disease. No surgical intervention is warranted.  Gastrointestinal:       Spastic colon. Intermittent diarrhea. Has more problems with chronic constipation.  Endocrine: Negative for polydipsia, polyphagia and polyuria.       Diabetic. History of thyroid problems. Thyroid has been  removed. She is on supplements.  Genitourinary:       Urinary leakage and incontinence. Nocturia. Denies dysuria.  Musculoskeletal:       Complaints of muscular weakness. Chronic backache. Arthritis in other joints. Unsteady gait.  Skin:       Seborrheic keratosis of the left breast.  Neurological:       Has difficulty with balance. There are episodes of dizziness. She experiences numbness in her feet. She walks wobbly and unsteady. His numbness and weakness in the left leg.  Hematological: Negative.   Psychiatric/Behavioral:       Chronic anxiety. Sleeps okay.       Objective:BP 160/70  Pulse 90  Temp(Src) 97.9 F (36.6 C) (Oral)  Resp 18  Ht 5\' 4"  (1.626 m)  Wt 138 lb (62.596 kg)  BMI 23.68 kg/m2  SpO2 95%    Physical Exam  Constitutional: She is oriented to person, place, and time.  Frail, thin, elderly female.  HENT:  Head: Normocephalic and atraumatic.  Mouth/Throat: No oropharyngeal exudate.  Eyes: Conjunctivae and EOM are normal. Pupils are equal, round, and reactive to light.  Neck: No JVD present. No tracheal deviation present.  Nuchal rigidity. History thyroidectomy.  Cardiovascular: Normal rate.  Exam reveals no gallop and no friction rub.   No murmur heard. Irregularly irregular rhythm.  Pulmonary/Chest: No respiratory distress. She has no wheezes. She has no rales.  Abdominal: Soft. Bowel sounds are normal. She exhibits no distension and no mass. There is no tenderness.  Musculoskeletal: She exhibits no edema.  Stiff neck. Shoulder discomfort on palpation. Unstable gait. Understands processes of the  lumbar area. Bilateral lower paraspinal muscular tenderness. Bulbous, tender knees with increased stiffness and crepitus. Left legworse than the right.  Lymphadenopathy:    She has no cervical adenopathy.  Neurological: She is alert and oriented to person, place, and time. No cranial nerve deficit. Coordination abnormal.  Difficulty with balance and with walking.   Skin: No rash noted. No erythema. No pallor.  Seborrheic keratosis left breast.  Psychiatric: Her behavior is normal. Thought content normal.  Moderate anxiety.        Assessment & Plan:  1. Acid reflux Use omeprazole as needed  2. Anxiety Chronic and unchanged  3. Hypothyroidism Stable  4. Seborrheic keratosis benign - desoximetasone (TOPICORT) 0.25 % cream; Apply to lesion daily  Dispense: 30 g; Refill: 2  5. Type II or unspecified type diabetes mellitus without mention of complication, not stated as uncontrolled Stable - Hemoglobin A1c; Future - Lipid panel; Future - Basic Metabolic Panel; Future  6. Type II or unspecified type diabetes mellitus with renal manifestations, not stated as uncontrolled(250.40) Stable  7. Unspecified essential hypertension Controlled  8. Unspecified pruritic disorder Chronic and unchanged  9. Spinal stenosis, unspecified region other than cervical Chronic and unchanged he

## 2012-12-17 NOTE — Telephone Encounter (Signed)
Spoke w/ patients daughter, Karen [name].  Says she brought paperwork from the [place], need to be filled out.  Says [name] is stressing because the form has not been returned to her.  Dr. Nyoka Cowden, do you have the form-  Per [name], it is not in your box. [name]

## 2013-01-09 ENCOUNTER — Other Ambulatory Visit: Payer: Self-pay | Admitting: *Deleted

## 2013-01-09 MED ORDER — HYDROCODONE-ACETAMINOPHEN 5-325 MG PO TABS: ORAL_TABLET | ORAL | Status: DC

## 2013-01-15 ENCOUNTER — Other Ambulatory Visit: Payer: Self-pay | Admitting: *Deleted

## 2013-01-15 MED ORDER — HYDROCODONE-ACETAMINOPHEN 5-325 MG PO TABS: ORAL_TABLET | ORAL | Status: DC

## 2013-01-18 ENCOUNTER — Other Ambulatory Visit: Payer: Self-pay | Admitting: Geriatric Medicine

## 2013-01-18 MED ORDER — ALPRAZOLAM 0.5 MG PO TABS: 0.5000 mg | ORAL_TABLET | Freq: Three times a day (TID) | ORAL | Status: DC | PRN

## 2013-01-18 MED ORDER — CYCLOBENZAPRINE HCL 10 MG PO TABS: 10.0000 mg | ORAL_TABLET | Freq: Two times a day (BID) | ORAL | Status: DC | PRN

## 2013-01-28 ENCOUNTER — Other Ambulatory Visit: Payer: Self-pay | Admitting: Geriatric Medicine

## 2013-01-28 MED ORDER — INSULIN GLARGINE 100 UNIT/ML SOLOSTAR PEN: 40.0000 [IU] | PEN_INJECTOR | Freq: Every day | SUBCUTANEOUS | Status: DC

## 2013-01-31 ENCOUNTER — Other Ambulatory Visit: Payer: Self-pay | Admitting: Geriatric Medicine

## 2013-02-04 ENCOUNTER — Other Ambulatory Visit: Payer: Medicare Other

## 2013-02-04 DIAGNOSIS — E119 Type 2 diabetes mellitus without complications: Secondary | ICD-10-CM

## 2013-02-05 ENCOUNTER — Encounter: Payer: Self-pay | Admitting: *Deleted

## 2013-02-05 LAB — HEMOGLOBIN A1C
Est. average glucose Bld gHb Est-mCnc: 171 mg/dL
Hgb A1c MFr Bld: 7.6 % — ABNORMAL HIGH (ref 4.8–5.6)

## 2013-02-05 LAB — BASIC METABOLIC PANEL
BUN/Creatinine Ratio: 16 (ref 11–26)
BUN: 20 mg/dL (ref 8–27)
Chloride: 98 mmol/L (ref 97–108)
GFR calc Af Amer: 45 mL/min/{1.73_m2} — ABNORMAL LOW (ref >59)
GFR calc non Af Amer: 39 mL/min/{1.73_m2} — ABNORMAL LOW (ref >59)
Glucose: 117 mg/dL — ABNORMAL HIGH (ref 65–99)

## 2013-02-05 LAB — LIPID PANEL
Cholesterol, Total: 160 mg/dL (ref 100–199)
HDL: 30 mg/dL — ABNORMAL LOW (ref >39)
VLDL Cholesterol Cal: 38 mg/dL (ref 5–40)

## 2013-02-06 ENCOUNTER — Ambulatory Visit (INDEPENDENT_AMBULATORY_CARE_PROVIDER_SITE_OTHER): Payer: Medicare Other | Admitting: Internal Medicine

## 2013-02-06 ENCOUNTER — Encounter: Payer: Self-pay | Admitting: Internal Medicine

## 2013-02-06 VITALS — BP 160/68 | HR 55 | Temp 98.7°F | Resp 16 | Ht 65.0 in | Wt 138.8 lb

## 2013-02-06 DIAGNOSIS — I10 Essential (primary) hypertension: Secondary | ICD-10-CM

## 2013-02-06 DIAGNOSIS — M4800 Spinal stenosis, site unspecified: Secondary | ICD-10-CM

## 2013-02-06 DIAGNOSIS — I83893 Varicose veins of bilateral lower extremities with other complications: Secondary | ICD-10-CM

## 2013-02-06 DIAGNOSIS — R1310 Dysphagia, unspecified: Secondary | ICD-10-CM

## 2013-02-06 DIAGNOSIS — L299 Pruritus, unspecified: Secondary | ICD-10-CM

## 2013-02-06 DIAGNOSIS — E1129 Type 2 diabetes mellitus with other diabetic kidney complication: Secondary | ICD-10-CM

## 2013-02-06 DIAGNOSIS — E785 Hyperlipidemia, unspecified: Secondary | ICD-10-CM

## 2013-02-06 DIAGNOSIS — E559 Vitamin D deficiency, unspecified: Secondary | ICD-10-CM

## 2013-02-06 DIAGNOSIS — K219 Gastro-esophageal reflux disease without esophagitis: Secondary | ICD-10-CM

## 2013-02-06 DIAGNOSIS — E039 Hypothyroidism, unspecified: Secondary | ICD-10-CM

## 2013-02-06 MED ORDER — ERGOCALCIFEROL 1.25 MG (50000 UT) PO CAPS: 50000.0000 [IU] | ORAL_CAPSULE | ORAL | Status: DC

## 2013-02-06 NOTE — Progress Notes (Signed)
Subjective:    Patient ID: Karen [name], female    DOB: [birth year], 79 y.o.   MRN: [id number]  HPI Reports a muscle spasm in the left medial thigh in late June 2014. Says she had a bruised appearance to her leg. Has varicosities. She is tender from the medial knee to the mid medial thigh. Used a heating pad which helps for a while. The left knee was swollen, but has gone down.  Right hip pain hurts to sit down or to get up. Started after the pain in the left leg. Has a difficult time getting in and out of bed.  Home glucose is variable but many are high  Acid reflux: Controlled  Hypothyroidism: Corrected on supplements  Unspecified essential hypertension: Modest elevation in systolic blood pressure. Patient is uncomfortable on today's visit, which may explain the elevated SBP  SOB (shortness of breath): Chronic and unchanged  Dysphagia, unspecified: Unchanged  Unspecified pruritic disorder: Unchanged  Spinal stenosis, unspecified region other than cervical: Contributes to persistent low back pain and gait instability  Varicose veins of lower extremities with other complications: Bilateral leg discomfort  Unspecified vitamin D deficiency: Currently on supplements  Type II or unspecified type diabetes mellitus with renal manifestations, not stated as uncontrolled(250.40): Not under ideal control   Past Medical History  Diagnosis Date  . Anemia   . Stroke   . Irregular heartbeat   . Sleep apnea   . Arthritis     body, neck  . TIA (transient ischemic attack) 2012  . Bladder disorder     over active  . Cancer     skin, removed  . Hiatal hernia   . Benign hypertensive heart disease   . CHF (congestive heart failure)   . Vitamin B12 deficiency   . Gastroparesis   . Hyperlipidemia   . Anxiety   . A-fib   . Spinal stenosis   . GERD (gastroesophageal reflux disease)   . Esophageal stricture   . Peripheral vascular disease, unspecified   . Nausea alone   .  Diarrhea   . Abnormal involuntary movements(781.0)   . Inflammatory disease of breast   . Transient ischemic attack (TIA), and cerebral infarction without residual deficits(V12.54)   . Urinary tract infection, site not specified   . Benign hypertensive heart disease with congestive heart failure   . Congestive heart failure, unspecified   . Nonspecific (abnormal) findings on radiological and other examination of skull and head   . Unspecified pruritic disorder   . Inflamed seborrheic keratosis   . Seborrheic keratosis   . Other B-complex deficiencies   . Chronic kidney disease, stage III (moderate)   . Type II or unspecified type diabetes mellitus with renal manifestations, not stated as uncontrolled(250.40)   . Postmenopausal atrophic vaginitis   . Palpitations   . Mixed incontinence urge and stress (female)(female)   . Contact dermatitis and other eczema, due to unspecified cause   . Unspecified vitamin D deficiency   . Corns and callosities   . Gastroparesis   . Other specified visual disturbances   . Spasm of muscle   . Edema   . Pain in joint, lower leg   . Other and unspecified hyperlipidemia   . Anxiety state, unspecified   . Unspecified hereditary and idiopathic peripheral neuropathy   . Unspecified constipation   . Unspecified essential hypertension   . Unspecified hypothyroidism   . Reflux esophagitis   . Other functional disorder of bladder   . Osteoarthrosis,  unspecified whether generalized or localized, unspecified site   . Cervicalgia   . Spinal stenosis, unspecified region other than cervical   . Lumbago   . Unspecified sleep apnea   . Other malaise and fatigue   . Stricture and stenosis of esophagus    Current Outpatient Prescriptions on File Prior to Visit  Medication Sig Dispense Refill  . Alcohol Swabs (ALCOHOL PREP) 70 % PADS       . ALPRAZolam (XANAX) 0.5 MG tablet Take 1 tablet (0.5 mg total) by mouth 3 (three) times daily as needed for sleep. Up to  three times daily  90 tablet  3  . aspirin 325 MG tablet Take 325 mg by mouth daily.      . CHOICE DM FORA G20 TEST STRIPS test strip       . CRANBERRY PO Take 300 mg by mouth daily.      . cyclobenzaprine (FLEXERIL) 10 MG tablet Take 1 tablet (10 mg total) by mouth 2 (two) times daily as needed for muscle spasms.  60 tablet  0  . desoximetasone (TOPICORT) 0.25 % cream Apply to lesion daily  30 g  2  . digoxin (LANOXIN) 0.125 MG tablet Take on Monday, Wednesday, Friday and Saturday  30 tablet  5  . diphenhydrAMINE-zinc acetate (BENADRYL ITCH STOPPING) cream Apply 1 application topically 3 (three) times daily as needed.      Marland Kitchen glucose blood test strip       . HYDROcodone-acetaminophen (NORCO/VICODIN) 5-325 MG per tablet Take 1 tablet 4 times a day as needed for pain  120 tablet  5  . Insulin Glargine (LANTUS SOLOSTAR) 100 UNIT/ML SOPN Inject 40 Units into the skin daily.  1 pen  6  . levothyroxine (SYNTHROID, LEVOTHROID) 100 MCG tablet Take 1 tablet by mouth daily.      . metoCLOPramide (REGLAN) 10 MG tablet Take 1 tablet by mouth daily.      Marland Kitchen NOVOLOG FLEXPEN 100 UNIT/ML injection       . omeprazole (PRILOSEC) 40 MG capsule Take 40 mg by mouth daily. Two tablets daily      . promethazine (PHENERGAN) 25 MG tablet Take 25 mg by mouth every 6 (six) hours as needed.      . trimethoprim (TRIMPEX) 100 MG tablet Take 1 tablet by mouth daily.      Marland Kitchen VITAMIN E PO Take 400 Units by mouth daily.       No current facility-administered medications on file prior to visit.     Review of Systems  Constitutional: Positive for fatigue. Negative for fever, chills, activity change, appetite change and unexpected weight change.  HENT: Negative.   Eyes: Negative.   Respiratory: Positive for shortness of breath. Negative for wheezing.   Cardiovascular: Negative for chest pain, palpitations and leg swelling.       Bilateral mild peripheral arterial disease. She has been evaluated by Dr. Ruta Hinds, vascular  surgery, on 06/21/12. He felt that she had bilateral mild peripheral arterial disease. Leg discomfort seems to be more related to her chronic back problems. She has mild external iliac artery stenosis, but does not have severe occlusive disease. No surgical intervention is warranted.  Gastrointestinal:       Spastic colon. Intermittent diarrhea. Has more problems with chronic constipation.  Endocrine: Negative for polydipsia, polyphagia and polyuria.       Diabetic. History of thyroid problems. Thyroid has been removed. She is on supplements.  Genitourinary:  Urinary leakage and incontinence. Nocturia. Denies dysuria.  Musculoskeletal:       Complaints of muscular weakness. Chronic backache. Arthritis in other joints. Unsteady gait. Bilateral lower leg discomfort.  Skin:       Seborrheic keratosis of the left breast.  Neurological:       Has difficulty with balance. There are episodes of dizziness. She experiences numbness in her feet. She walks wobbly and unsteady. His numbness and weakness in the left leg.  Hematological: Negative.   Psychiatric/Behavioral:       Chronic anxiety. Sleeps okay.       Objective:   Physical Exam  Constitutional: She is oriented to person, place, and time.  Frail, thin, elderly female.  HENT:  Head: Normocephalic and atraumatic.  Mouth/Throat: No oropharyngeal exudate.  Eyes: Conjunctivae and EOM are normal. Pupils are equal, round, and reactive to light.  Neck: No JVD present. No tracheal deviation present.  Nuchal rigidity. History thyroidectomy.  Cardiovascular: Normal rate.  Exam reveals no gallop and no friction rub.   No murmur heard. Irregularly irregular rhythm.  Pulmonary/Chest: No respiratory distress. She has no wheezes. She has no rales.  Abdominal: Soft. Bowel sounds are normal. She exhibits no distension and no mass. There is no tenderness.  Musculoskeletal: She exhibits no edema.  Stiff neck. Shoulder discomfort on palpation.  Unstable gait. Tender processes of the lumbar area. Bilateral lower paraspinal muscular tenderness. Bulbous, tender knees with increased stiffness and crepitus. Left leg worse than the right.  Lymphadenopathy:    She has no cervical adenopathy.  Neurological: She is alert and oriented to person, place, and time. No cranial nerve deficit. Coordination abnormal.  Difficulty with balance and with walking.  Skin: No rash noted. No erythema. No pallor.  Seborrheic keratosis left breast.  Psychiatric: Her behavior is normal. Thought content normal.  Moderate anxiety.      Appointment on 02/04/2013  Component Date Value Range Status  . Hemoglobin A1C 02/04/2013 7.6* 4.8 - 5.6 % Final   Comment:          Increased risk for diabetes: 5.7 - 6.4                                   Diabetes: >6.4                                   Glycemic control for adults with diabetes: <7.0  . Estimated average glucose 02/04/2013 171   Final  . Cholesterol, Total 02/04/2013 160  100 - 199 mg/dL Final  . Triglycerides 02/04/2013 189* 0 - 149 mg/dL Final  . HDL 02/04/2013 30* >39 mg/dL Final   Comment: According to ATP-III Guidelines, HDL-C >59 mg/dL is considered a                          negative risk factor for CHD.  Marland Kitchen VLDL Cholesterol Cal 02/04/2013 38  5 - 40 mg/dL Final  . LDL Calculated 02/04/2013 92  0 - 99 mg/dL Final  . Chol/HDL Ratio 02/04/2013 5.3* 0.0 - 4.4 ratio units Final  . Glucose 02/04/2013 117* 65 - 99 mg/dL Final  . BUN 02/04/2013 20  8 - 27 mg/dL Final  . Creatinine, Ser 02/04/2013 1.24* 0.57 - 1.00 mg/dL Final  . GFR calc non Af Amer 02/04/2013 39* >  59 mL/min/1.73 Final  . GFR calc Af Amer 02/04/2013 45* >59 mL/min/1.73 Final  . BUN/Creatinine Ratio 02/04/2013 16  11 - 26 Final  . Sodium 02/04/2013 141  134 - 144 mmol/L Final  . Potassium 02/04/2013 4.5  3.5 - 5.2 mmol/L Final  . Chloride 02/04/2013 98  97 - 108 mmol/L Final  . CO2 02/04/2013 29  18 - 29 mmol/L Final  . Calcium  02/04/2013 9.7  8.6 - 10.2 mg/dL Final       Assessment & Plan:  Varicose veins of lower extremities with other complications : May be associated with lower leg discomfort, although I suspect that her spinal stenosis is more important problem.- Plan: US Venous Img Lower Unilateral Left  Unspecified essential hypertension: Continue to monitor on current medications  Type II or unspecified type diabetes mellitus with renal manifestations, not stated as uncontrolled(250.40 ): Continue current medications - Plan: Basic metabolic panel, Hemoglobin A1c  Spinal stenosis, unspecified region other than cervical: Unchanged  Dysphagia, unspecified: Unchanged  Acid reflux: Continue omeprazole and metoclopramide  Unspecified pruritic disorder: Unchanged  Unspecified vitamin D deficiency - Plan: ergocalciferol (VITAMIN D2) 50000 UNITS capsule  Other and unspecified hyperlipidemia - Plan: Lipid panel  Unspecified hypothyroidism - Plan: TSH

## 2013-02-06 NOTE — Patient Instructions (Addendum)
Get venous ultrasound done.  Continue current medications Check the [employer] web site regarding patient assistance.

## 2013-02-07 ENCOUNTER — Encounter: Payer: Self-pay | Admitting: Internal Medicine

## 2013-02-11 ENCOUNTER — Other Ambulatory Visit: Payer: Self-pay | Admitting: Geriatric Medicine

## 2013-02-11 MED ORDER — HYDROCHLOROTHIAZIDE 25 MG PO TABS: 25.0000 mg | ORAL_TABLET | Freq: Every day | ORAL | Status: DC

## 2013-02-12 ENCOUNTER — Telehealth: Payer: Self-pay | Admitting: Geriatric Medicine

## 2013-02-12 ENCOUNTER — Ambulatory Visit
Admission: RE | Admit: 2013-02-12 | Discharge: 2013-02-12 | Disposition: A | Discharge status: Home / Self Care | Payer: Medicare Other | Source: Ambulatory Visit | Attending: Internal Medicine | Admitting: Internal Medicine

## 2013-02-12 DIAGNOSIS — I83893 Varicose veins of bilateral lower extremities with other complications: Secondary | ICD-10-CM

## 2013-02-12 NOTE — Telephone Encounter (Signed)
Patient called and is at [facility]. She is in a great deal of pain in her right hip. She just had her venous ultrasound done. Sh was under the impression that she was getting something done about her right hip today as well. Please advise, thanks.

## 2013-02-13 NOTE — Telephone Encounter (Signed)
Venous ultrasound was normal. No Venous clot. Pain in the leg and hip is likely arthritic. Arrange Orthopedic appt if she desires.

## 2013-02-14 NOTE — Telephone Encounter (Signed)
Patient aware. Says she is using a heating pad and is feeling better. She will call us if she wants an ortho appointment.

## 2013-03-26 ENCOUNTER — Other Ambulatory Visit: Payer: Self-pay | Admitting: *Deleted

## 2013-03-26 MED ORDER — INSULIN PEN NEEDLE 29G X 12.7MM MISC: Status: DC

## 2013-03-28 ENCOUNTER — Telehealth: Payer: Self-pay | Admitting: *Deleted

## 2013-03-28 NOTE — Telephone Encounter (Signed)
Dr. Nyoka Cowden filled out [employer] Patient Assistance Program Application for Patient for [employer] Called son [name] # [id number] to pick up

## 2013-04-08 ENCOUNTER — Other Ambulatory Visit: Payer: Self-pay | Admitting: *Deleted

## 2013-04-08 MED ORDER — TRIMETHOPRIM 100 MG PO TABS: ORAL_TABLET | ORAL | Status: DC

## 2013-05-07 ENCOUNTER — Other Ambulatory Visit: Payer: Self-pay | Admitting: *Deleted

## 2013-05-07 MED ORDER — CYCLOBENZAPRINE HCL 10 MG PO TABS: 10.0000 mg | ORAL_TABLET | Freq: Two times a day (BID) | ORAL | Status: DC | PRN

## 2013-05-07 MED ORDER — ALPRAZOLAM 0.5 MG PO TABS: ORAL_TABLET | ORAL | Status: DC

## 2013-05-13 ENCOUNTER — Other Ambulatory Visit: Payer: Medicare Other

## 2013-05-13 DIAGNOSIS — E1129 Type 2 diabetes mellitus with other diabetic kidney complication: Secondary | ICD-10-CM

## 2013-05-13 DIAGNOSIS — E039 Hypothyroidism, unspecified: Secondary | ICD-10-CM

## 2013-05-13 DIAGNOSIS — E785 Hyperlipidemia, unspecified: Secondary | ICD-10-CM

## 2013-05-14 LAB — HEMOGLOBIN A1C
Est. average glucose Bld gHb Est-mCnc: 148 mg/dL
Hgb A1c MFr Bld: 6.8 % — ABNORMAL HIGH (ref 4.8–5.6)

## 2013-05-14 LAB — BASIC METABOLIC PANEL
BUN/Creatinine Ratio: 18 (ref 11–26)
BUN: 22 mg/dL (ref 8–27)
GFR calc Af Amer: 46 mL/min/{1.73_m2} — ABNORMAL LOW (ref >59)
GFR calc non Af Amer: 40 mL/min/{1.73_m2} — ABNORMAL LOW (ref >59)
Glucose: 128 mg/dL — ABNORMAL HIGH (ref 65–99)

## 2013-05-15 ENCOUNTER — Encounter: Payer: Self-pay | Admitting: Internal Medicine

## 2013-05-15 ENCOUNTER — Ambulatory Visit (INDEPENDENT_AMBULATORY_CARE_PROVIDER_SITE_OTHER): Payer: Medicare Other | Admitting: Internal Medicine

## 2013-05-15 VITALS — BP 128/70 | HR 89 | Temp 97.6°F | Wt 139.0 lb

## 2013-05-15 DIAGNOSIS — E1129 Type 2 diabetes mellitus with other diabetic kidney complication: Secondary | ICD-10-CM

## 2013-05-15 DIAGNOSIS — R1310 Dysphagia, unspecified: Secondary | ICD-10-CM

## 2013-05-15 DIAGNOSIS — E039 Hypothyroidism, unspecified: Secondary | ICD-10-CM

## 2013-05-15 DIAGNOSIS — M1712 Unilateral primary osteoarthritis, left knee: Secondary | ICD-10-CM

## 2013-05-15 DIAGNOSIS — R0602 Shortness of breath: Secondary | ICD-10-CM

## 2013-05-15 DIAGNOSIS — K219 Gastro-esophageal reflux disease without esophagitis: Secondary | ICD-10-CM

## 2013-05-15 DIAGNOSIS — R251 Tremor, unspecified: Secondary | ICD-10-CM

## 2013-05-15 DIAGNOSIS — R259 Unspecified abnormal involuntary movements: Secondary | ICD-10-CM

## 2013-05-15 DIAGNOSIS — M1710 Unilateral primary osteoarthritis, unspecified knee: Secondary | ICD-10-CM

## 2013-05-15 DIAGNOSIS — I83893 Varicose veins of bilateral lower extremities with other complications: Secondary | ICD-10-CM

## 2013-05-15 DIAGNOSIS — I10 Essential (primary) hypertension: Secondary | ICD-10-CM

## 2013-05-15 DIAGNOSIS — N183 Chronic kidney disease, stage 3 unspecified: Secondary | ICD-10-CM

## 2013-05-15 DIAGNOSIS — M4800 Spinal stenosis, site unspecified: Secondary | ICD-10-CM

## 2013-05-15 DIAGNOSIS — I739 Peripheral vascular disease, unspecified: Secondary | ICD-10-CM

## 2013-05-15 DIAGNOSIS — Z23 Encounter for immunization: Secondary | ICD-10-CM

## 2013-05-15 DIAGNOSIS — R252 Cramp and spasm: Secondary | ICD-10-CM

## 2013-05-15 DIAGNOSIS — M170 Bilateral primary osteoarthritis of knee: Secondary | ICD-10-CM | POA: Insufficient documentation

## 2013-05-15 DIAGNOSIS — IMO0002 Reserved for concepts with insufficient information to code with codable children: Secondary | ICD-10-CM

## 2013-05-15 DIAGNOSIS — L299 Pruritus, unspecified: Secondary | ICD-10-CM

## 2013-05-15 MED ORDER — HYDROCODONE-ACETAMINOPHEN 5-325 MG PO TABS: ORAL_TABLET | ORAL | Status: DC

## 2013-05-15 NOTE — Progress Notes (Signed)
Subjective:    Patient ID: Karen [name], female    DOB: 30-Jan-1924, 79 y.o.   MRN: [id number]  Chief Complaint  Patient presents with  . Medical Managment of Chronic Issues    3 month follow-up   . Spasms    ongoing muscle spasms, medication only help a little     HPI  Started noticing flashes of light in both eyes. Had normal exam March 2014. No other change in vision or holes in vision.  Tremor: Right knee has been shaking. Left also shakes, but less prominently.  Type II or unspecified type diabetes mellitus with renal manifestations, not stated as uncontrolled(250.40): improved  Hypothyroidism: improved  Varicose veins of lower extremities with other complications; unchanged  Spinal stenosis, unspecified region other than cervical: less back pain  Unspecified essential hypertension: controlled  Unspecified pruritic disorder: unchanged  Peripheral vascular disease, unspecified: unchanged. Diminished pedal pulses  Need for prophylactic vaccination and inoculation against influenza  Cramp in muscle: Cramps inside the left thigh  Chronic kidney disease, stage III (moderate): GFR 40 on 05/13/13  Osteoarthritis of left knee: severe pain.  Acid reflux: persistent problem despite metoclopramide and omeprazole  SOB (shortness of breath): unchanged  Dysphagia, unspecified:improved     Current Outpatient Prescriptions on File Prior to Visit  Medication Sig Dispense Refill  . ALPRAZolam (XANAX) 0.5 MG tablet Take one tablet by mouth up to 3 times daily as needed for anxiety or sleep  90 tablet  3  . aspirin 325 MG tablet Take 325 mg by mouth daily.      Marland Kitchen CRANBERRY PO Take 300 mg by mouth daily.      . cyclobenzaprine (FLEXERIL) 10 MG tablet Take 1 tablet (10 mg total) by mouth 2 (two) times daily as needed for muscle spasms.  60 tablet  0  . desoximetasone (TOPICORT) 0.25 % cream Apply to lesion daily  30 g  2  . digoxin (LANOXIN) 0.125 MG tablet Take on  Monday, Wednesday, Friday and Saturday  30 tablet  5  . diphenhydrAMINE-zinc acetate (BENADRYL ITCH STOPPING) cream Apply 1 application topically 3 (three) times daily as needed.      . ergocalciferol (VITAMIN D2) 50000 UNITS capsule Take 1 capsule (50,000 Units total) by mouth once a week.  4 capsule  11  . hydrochlorothiazide (HYDRODIURIL) 25 MG tablet Take 1 tablet (25 mg total) by mouth daily.  30 tablet  3  . HYDROcodone-acetaminophen (NORCO/VICODIN) 5-325 MG per tablet Take 1 tablet 4 times a day as needed for pain  120 tablet  5  . Insulin Glargine (LANTUS SOLOSTAR) 100 UNIT/ML SOPN Inject 40 Units into the skin daily.  1 pen  6  . Insulin Pen Needle 29G X 12.7MM MISC Use as directed to administer insulin. DX: 250.40  100 each  3  . levothyroxine (SYNTHROID, LEVOTHROID) 100 MCG tablet Take 1 tablet by mouth daily.      . metoCLOPramide (REGLAN) 10 MG tablet Take 1 tablet by mouth daily.      Marland Kitchen NOVOLOG FLEXPEN 100 UNIT/ML injection 10-12 units (am), 12 units noon, 14 units qhs      . omeprazole (PRILOSEC) 40 MG capsule Take 40 mg by mouth daily. Two tablets daily      . promethazine (PHENERGAN) 25 MG tablet Take 25 mg by mouth every 6 (six) hours as needed.      . trimethoprim (TRIMPEX) 100 MG tablet Take one tablet by mouth every night at bedtime to prevent bladder  infection  30 tablet  6  . VITAMIN E PO Take 400 Units by mouth daily.       No current facility-administered medications on file prior to visit.    Review of Systems  Constitutional: Positive for fatigue. Negative for fever, chills, activity change, appetite change and unexpected weight change.  HENT: Negative.   Eyes: Negative.   Respiratory: Positive for shortness of breath. Negative for wheezing.   Cardiovascular: Negative for chest pain, palpitations and leg swelling.       Bilateral mild peripheral arterial disease. She has been evaluated by Dr. Ruta Hinds, vascular surgery, on 06/21/12. He felt that she had  bilateral mild peripheral arterial disease. Leg discomfort seems to be more related to her chronic back problems. She has mild external iliac artery stenosis, but does not have severe occlusive disease. No surgical intervention is warranted.  Gastrointestinal:       Spastic colon. Intermittent diarrhea. Has more problems with chronic constipation.  Endocrine: Negative for polydipsia, polyphagia and polyuria.       Diabetic. History of thyroid problems. Thyroid has been removed. She is on supplements.  Genitourinary:       Urinary leakage and incontinence. Nocturia. Denies dysuria.  Musculoskeletal:       Complaints of muscular weakness. Chronic backache. Arthritis in other joints. Unsteady gait. Bilateral lower leg discomfort.  Skin:       Seborrheic keratosis of the left breast.  Neurological:       Has difficulty with balance. There are episodes of dizziness. She experiences numbness in her feet. She walks wobbly and unsteady. His numbness and weakness in the left leg.  Hematological: Negative.   Psychiatric/Behavioral:       Chronic anxiety. Sleeps okay.       Objective:BP 128/70  Pulse 89  Temp(Src) 97.6 F (36.4 C) (Oral)  Wt 139 lb (63.05 kg)  BMI 23.13 kg/m2  SpO2 93%    Physical Exam  Constitutional: She is oriented to person, place, and time.  Frail, thin, elderly female.  HENT:  Head: Normocephalic and atraumatic.  Mouth/Throat: No oropharyngeal exudate.  Eyes: Conjunctivae and EOM are normal. Pupils are equal, round, and reactive to light.  Neck: No JVD present. No tracheal deviation present.  Nuchal rigidity. History thyroidectomy.  Cardiovascular: Normal rate.  Exam reveals no gallop and no friction rub.   No murmur heard. Irregularly irregular rhythm. Diminished pedal pulses  Pulmonary/Chest: No respiratory distress. She has no wheezes. She has no rales.  Abdominal: Soft. Bowel sounds are normal. She exhibits no distension and no mass. There is no tenderness.   Musculoskeletal: She exhibits no edema.  Stiff neck. Shoulder discomfort on palpation. Unstable gait. Tender processes of the lumbar area. Bilateral lower paraspinal muscular tenderness. Bulbous, tender knees with increased stiffness and crepitus. Left leg worse than the right.  Lymphadenopathy:    She has no cervical adenopathy.  Neurological: She is alert and oriented to person, place, and time. No cranial nerve deficit. Coordination abnormal.  Difficulty with balance and with walking.  Skin: No rash noted. No erythema. No pallor.  Seborrheic keratosis left breast.  Psychiatric: Her behavior is normal. Thought content normal.  Moderate anxiety.      Appointment on 05/13/2013  Component Date Value Range Status  . Glucose 05/13/2013 128* 65 - 99 mg/dL Final  . BUN 05/13/2013 22  8 - 27 mg/dL Final  . Creatinine, Ser 05/13/2013 1.23* 0.57 - 1.00 mg/dL Final  . GFR calc non Af  Amer 05/13/2013 40* >59 mL/min/1.73 Final  . GFR calc Af Amer 05/13/2013 46* >59 mL/min/1.73 Final  . BUN/Creatinine Ratio 05/13/2013 18  11 - 26 Final  . Sodium 05/13/2013 143  134 - 144 mmol/L Final  . Potassium 05/13/2013 4.9  3.5 - 5.2 mmol/L Final  . Chloride 05/13/2013 100  97 - 108 mmol/L Final  . CO2 05/13/2013 28  18 - 29 mmol/L Final  . Calcium 05/13/2013 9.3  8.6 - 10.2 mg/dL Final  . Cholesterol, Total 05/13/2013 159  100 - 199 mg/dL Final  . Triglycerides 05/13/2013 180* 0 - 149 mg/dL Final  . HDL 05/13/2013 27* >39 mg/dL Final   Comment: According to ATP-III Guidelines, HDL-C >59 mg/dL is considered a                          negative risk factor for CHD.  Marland Kitchen VLDL Cholesterol Cal 05/13/2013 36  5 - 40 mg/dL Final  . LDL Calculated 05/13/2013 96  0 - 99 mg/dL Final  . Chol/HDL Ratio 05/13/2013 5.9* 0.0 - 4.4 ratio units Final   Comment:                                   T. Chol/HDL Ratio                                                                      Men  Women                                                         1/2 Avg.Risk  3.4    3.3                                                            Avg.Risk  5.0    4.4                                                         2X Avg.Risk  9.6    7.1                                                         3X Avg.Risk 23.4   11.0  . Hemoglobin A1C 05/13/2013 6.8* 4.8 - 5.6 % Final   Comment:          Increased risk for diabetes: 5.7 - 6.4  Diabetes: >6.4                                   Glycemic control for adults with diabetes: <7.0  . Estimated average glucose 05/13/2013 148   Final  . TSH 05/13/2013 3.980  0.450 - 4.500 uIU/mL Final       Assessment & Plan:  Type II or unspecified type diabetes mellitus with renal manifestations, not stated as uncontrolled(250.40): adequate control  Hypothyroidism: compensated by supplement  Varicose veins of lower extremities with other complications:chronic and unchanged  Spinal stenosis, unspecified region other than cervical: continues with back pain  Unspecified essential hypertension: controlled  Unspecified pruritic disorder: continues with intermittent intense itching in localized areas  Peripheral vascular disease, unspecified: chronic  Need for prophylactic vaccination and inoculation against influenza  Cramp in muscle: discussed adding additional medication, but she does not want at this time due to expense of current medication  Chronic kidney disease, stage III (moderate)  Osteoarthritis of left knee: PROCEDURE: cleanse with betadine. Inject with 1cc kenalog and 2 cc 1% Lidocaine  Acid reflux: intermittent  SOB (shortness of breath); with exertion  Dysphagia, unspecified:improved  Tremor: most like BET. She does not want more medication at this time

## 2013-05-15 NOTE — Patient Instructions (Signed)
Continue medications as listed 

## 2013-06-06 ENCOUNTER — Other Ambulatory Visit: Payer: Self-pay | Admitting: *Deleted

## 2013-06-06 MED ORDER — HYDROCHLOROTHIAZIDE 25 MG PO TABS: 25.0000 mg | ORAL_TABLET | Freq: Every day | ORAL | Status: DC

## 2013-07-01 ENCOUNTER — Other Ambulatory Visit: Payer: Self-pay | Admitting: *Deleted

## 2013-07-01 MED ORDER — METOCLOPRAMIDE HCL 10 MG PO TABS: 10.0000 mg | ORAL_TABLET | Freq: Every day | ORAL | Status: DC

## 2013-07-01 MED ORDER — INSULIN GLARGINE 100 UNIT/ML SOLOSTAR PEN: 40.0000 [IU] | PEN_INJECTOR | Freq: Every day | SUBCUTANEOUS | Status: DC

## 2013-07-11 ENCOUNTER — Other Ambulatory Visit: Payer: Medicare Other

## 2013-07-11 DIAGNOSIS — E039 Hypothyroidism, unspecified: Secondary | ICD-10-CM

## 2013-07-11 DIAGNOSIS — E1129 Type 2 diabetes mellitus with other diabetic kidney complication: Secondary | ICD-10-CM

## 2013-07-12 LAB — COMPREHENSIVE METABOLIC PANEL
Albumin: 4.5 g/dL (ref 3.5–4.7)
BUN/Creatinine Ratio: 16 (ref 11–26)
BUN: 21 mg/dL (ref 8–27)
CO2: 28 mmol/L (ref 18–29)
Calcium: 9.3 mg/dL (ref 8.6–10.2)
Creatinine, Ser: 1.28 mg/dL — ABNORMAL HIGH (ref 0.57–1.00)
GFR calc non Af Amer: 38 mL/min/{1.73_m2} — ABNORMAL LOW (ref >59)
Globulin, Total: 2.2 g/dL (ref 1.5–4.5)
Potassium: 4.6 mmol/L (ref 3.5–5.2)
Total Bilirubin: 0.3 mg/dL (ref 0.0–1.2)

## 2013-07-12 LAB — HEMOGLOBIN A1C
Est. average glucose Bld gHb Est-mCnc: 134 mg/dL
Hgb A1c MFr Bld: 6.3 % — ABNORMAL HIGH (ref 4.8–5.6)

## 2013-07-12 LAB — TSH: TSH: 5.36 u[IU]/mL — ABNORMAL HIGH (ref 0.450–4.500)

## 2013-07-16 ENCOUNTER — Ambulatory Visit (INDEPENDENT_AMBULATORY_CARE_PROVIDER_SITE_OTHER): Payer: Medicare Other | Admitting: Internal Medicine

## 2013-07-16 ENCOUNTER — Encounter: Payer: Self-pay | Admitting: Internal Medicine

## 2013-07-16 VITALS — BP 130/70 | HR 51 | Temp 98.1°F | Resp 12 | Wt 140.0 lb

## 2013-07-16 DIAGNOSIS — E785 Hyperlipidemia, unspecified: Secondary | ICD-10-CM

## 2013-07-16 DIAGNOSIS — E1129 Type 2 diabetes mellitus with other diabetic kidney complication: Secondary | ICD-10-CM

## 2013-07-16 DIAGNOSIS — R259 Unspecified abnormal involuntary movements: Secondary | ICD-10-CM

## 2013-07-16 DIAGNOSIS — R1310 Dysphagia, unspecified: Secondary | ICD-10-CM

## 2013-07-16 DIAGNOSIS — R251 Tremor, unspecified: Secondary | ICD-10-CM

## 2013-07-16 DIAGNOSIS — Z2839 Other underimmunization status: Secondary | ICD-10-CM | POA: Insufficient documentation

## 2013-07-16 DIAGNOSIS — M1710 Unilateral primary osteoarthritis, unspecified knee: Secondary | ICD-10-CM

## 2013-07-16 DIAGNOSIS — I10 Essential (primary) hypertension: Secondary | ICD-10-CM

## 2013-07-16 DIAGNOSIS — K219 Gastro-esophageal reflux disease without esophagitis: Secondary | ICD-10-CM

## 2013-07-16 DIAGNOSIS — R0602 Shortness of breath: Secondary | ICD-10-CM

## 2013-07-16 DIAGNOSIS — R252 Cramp and spasm: Secondary | ICD-10-CM

## 2013-07-16 DIAGNOSIS — M1712 Unilateral primary osteoarthritis, left knee: Secondary | ICD-10-CM

## 2013-07-16 DIAGNOSIS — M4800 Spinal stenosis, site unspecified: Secondary | ICD-10-CM

## 2013-07-16 DIAGNOSIS — Z283 Underimmunization status: Secondary | ICD-10-CM | POA: Insufficient documentation

## 2013-07-16 MED ORDER — TETANUS-DIPHTH-ACELL PERTUSSIS 5-2.5-18.5 LF-MCG/0.5 IM SUSP: 0.5000 mL | Freq: Once | INTRAMUSCULAR | Status: DC

## 2013-07-16 MED ORDER — HYDROCODONE-ACETAMINOPHEN 5-325 MG PO TABS: ORAL_TABLET | ORAL | Status: DC

## 2013-07-16 MED ORDER — METAXALONE 800 MG PO TABS: ORAL_TABLET | ORAL | Status: DC

## 2013-07-16 NOTE — Progress Notes (Signed)
Patient ID: Karen [name], female   DOB: 18-Sep-1923, 79 y.o.   MRN: [id number]    Location:    [facility]  Place of Service:  Office   Allergies  Allergen Reactions  . Celebrex [Celecoxib] Rash  . Doxycycline Rash  . Lyrica [Pregabalin] Rash  . Avandia [Rosiglitazone]   . Macrodantin [Nitrofurantoin Macrocrystal]   . Sulfa Antibiotics   . Vioxx [Rofecoxib]     Chief Complaint  Patient presents with  . Medical Managment of Chronic Issues    3 month follow-up, discuss labs (copy printed)     HPI:  Leg cramps are getting worse.   Glucose was 73 after lunch and she felt like she was having low sugar reaction. Has had other episodes of glucose 49-69  Tremor: unchanged  Unspecified essential hypertension:controlled  Chronic kidney disease, stage III (moderate): unchanged  Acid reflux: asymptomatic  Dysphagia, unspecified: stable  Type II or unspecified type diabetes mellitus with renal manifestations, not stated as uncontrolled(250.40);controllede  SOB (shortness of breath): unchanged  Spinal stenosis, unspecified region other than cervical: chronic neck pain  Osteoarthritis of left knee -gets relief from  HYDROcodone-acetaminophen (NORCO/VICODIN) 5-325 MG per tablet  Cramp in limb - cyclobenzaprine does not seem to be helping  Other and unspecified hyperlipidemia: unchanged   Medications: Patient's Medications  New Prescriptions   No medications on file  Previous Medications   ALCOHOL SWABS (ALCOHOL PREP) 70 % PADS       ALPRAZOLAM (XANAX) 0.5 MG TABLET    Take one tablet by mouth up to 3 times daily as needed for anxiety or sleep   ASPIRIN 325 MG TABLET    Take 325 mg by mouth daily.   BLOOD GLUCOSE MONITORING SUPPL (BAYER BREEZE 2 SYSTEM) W/DEVICE KIT       CRANBERRY PO    Take 300 mg by mouth daily.   CYCLOBENZAPRINE (FLEXERIL) 10 MG TABLET    Take 1 tablet (10 mg total) by mouth 2 (two) times daily as needed for muscle spasms.   DESOXIMETASONE (TOPICORT)  0.25 % CREAM    Apply to lesion daily   DIGOXIN (LANOXIN) 0.125 MG TABLET    Take on Monday, Wednesday, Friday and Saturday   DIPHENHYDRAMINE-ZINC ACETATE (BENADRYL ITCH STOPPING) CREAM    Apply 1 application topically 3 (three) times daily as needed.   EASY COMFORT INSULIN SYRINGE 30G X 1/2" 0.5 ML MISC       ERGOCALCIFEROL (VITAMIN D2) 50000 UNITS CAPSULE    Take 1 capsule (50,000 Units total) by mouth once a week.   GLUCOSE BLOOD (BAYER BREEZE 2 TEST VI)    Check blood sugar 3 x daily as directed. DX:250.02   HYDROCHLOROTHIAZIDE (HYDRODIURIL) 25 MG TABLET    Take 1 tablet (25 mg total) by mouth daily.   HYDROCODONE-ACETAMINOPHEN (NORCO/VICODIN) 5-325 MG PER TABLET    Take 1 tablet 4 times a day as needed for pain   INSULIN GLARGINE (LANTUS SOLOSTAR) 100 UNIT/ML SOPN    Inject 40 Units into the skin daily.   INSULIN PEN NEEDLE 29G X 12.7MM MISC    Use as directed to administer insulin. DX: 250.40   LANCET DEVICES (LANCING DEVICE) MISC       LEVOTHYROXINE (SYNTHROID, LEVOTHROID) 100 MCG TABLET    Take 1 tablet by mouth daily.   METOCLOPRAMIDE (REGLAN) 10 MG TABLET    Take 1 tablet (10 mg total) by mouth daily.   NOVOLOG FLEXPEN 100 UNIT/ML INJECTION    10-12 units (am), 12  units noon, 14 units qhs   OMEPRAZOLE (PRILOSEC) 40 MG CAPSULE    Take 40 mg by mouth daily. Two tablets daily   PROMETHAZINE (PHENERGAN) 25 MG TABLET    Take 25 mg by mouth every 6 (six) hours as needed.   TDAP (BOOSTRIX) 5-2.5-18.5 LF-MCG/0.5 INJECTION    Inject 0.5 mLs into the muscle once.   TRIMETHOPRIM (TRIMPEX) 100 MG TABLET    Take one tablet by mouth every night at bedtime to prevent bladder infection   VITAMIN E PO    Take 400 Units by mouth daily.  Modified Medications   No medications on file  Discontinued Medications   No medications on file     Review of Systems  Constitutional: Positive for fatigue. Negative for fever, chills, activity change, appetite change and unexpected weight change.  HENT:  Negative.   Eyes: Negative.   Respiratory: Positive for shortness of breath. Negative for wheezing.   Cardiovascular: Negative for chest pain, palpitations and leg swelling.       Bilateral mild peripheral arterial disease. She has been evaluated by Dr. Ruta Hinds, vascular surgery, on 06/21/12. He felt that she had bilateral mild peripheral arterial disease. Leg discomfort seems to be more related to her chronic back problems. She has mild external iliac artery stenosis, but does not have severe occlusive disease. No surgical intervention is warranted.  Gastrointestinal:       Spastic colon. Intermittent diarrhea. Has more problems with chronic constipation.  Endocrine: Negative for polydipsia, polyphagia and polyuria.       Diabetic. History of thyroid problems. Thyroid has been removed. She is on supplements.  Genitourinary:       Urinary leakage and incontinence. Nocturia. Denies dysuria.  Musculoskeletal:       Complaints of muscular weakness. Chronic backache. Arthritis in other joints. Unsteady gait. Bilateral lower leg discomfort.  Skin:       Seborrheic keratosis of the left breast.  Neurological:       Has difficulty with balance. There are episodes of dizziness. She experiences numbness in her feet. She walks wobbly and unsteady. His numbness and weakness in the left leg.  Hematological: Negative.   Psychiatric/Behavioral:       Chronic anxiety. Sleeps okay.    Filed Vitals:   07/16/13 1613  BP: 130/70  Pulse: 51  Temp: 98.1 F (36.7 C)  TempSrc: Oral  Resp: 12  Weight: 140 lb (63.504 kg)  SpO2: 93%   Physical Exam  Constitutional: She is oriented to person, place, and time.  Frail, thin, elderly female.  HENT:  Head: Normocephalic and atraumatic.  Mouth/Throat: No oropharyngeal exudate.  Eyes: Conjunctivae and EOM are normal. Pupils are equal, round, and reactive to light.  Neck: No JVD present. No tracheal deviation present.  Nuchal rigidity. History  thyroidectomy.  Cardiovascular: Normal rate.  Exam reveals no gallop and no friction rub.   No murmur heard. Irregularly irregular rhythm. Diminished pedal pulses  Pulmonary/Chest: No respiratory distress. She has no wheezes. She has no rales.  Abdominal: Soft. Bowel sounds are normal. She exhibits no distension and no mass. There is no tenderness.  Musculoskeletal: She exhibits no edema.  Stiff neck. Shoulder discomfort on palpation. Unstable gait. Tender processes of the lumbar area. Bilateral lower paraspinal muscular tenderness. Bulbous, tender knees with increased stiffness and crepitus. Left leg worse than the right.  Lymphadenopathy:    She has no cervical adenopathy.  Neurological: She is alert and oriented to person, place, and time. No  cranial nerve deficit. Coordination abnormal.  Difficulty with balance and with walking.  Skin: No rash noted. No erythema. No pallor.  Seborrheic keratosis left breast.  Psychiatric: Her behavior is normal. Thought content normal.  Moderate anxiety.     Labs reviewed: Appointment on 07/11/2013  Component Date Value Range Status  . Hemoglobin A1C 07/11/2013 6.3* 4.8 - 5.6 % Final   Comment:          Increased risk for diabetes: 5.7 - 6.4                                   Diabetes: >6.4                                   Glycemic control for adults with diabetes: <7.0  . Estimated average glucose 07/11/2013 134   Final  . Glucose 07/11/2013 116* 65 - 99 mg/dL Final  . BUN 07/11/2013 21  8 - 27 mg/dL Final  . Creatinine, Ser 07/11/2013 1.28* 0.57 - 1.00 mg/dL Final  . GFR calc non Af Amer 07/11/2013 38* >59 mL/min/1.73 Final  . GFR calc Af Amer 07/11/2013 43* >59 mL/min/1.73 Final  . BUN/Creatinine Ratio 07/11/2013 16  11 - 26 Final  . Sodium 07/11/2013 144  134 - 144 mmol/L Final  . Potassium 07/11/2013 4.6  3.5 - 5.2 mmol/L Final  . Chloride 07/11/2013 99  97 - 108 mmol/L Final  . CO2 07/11/2013 28  18 - 29 mmol/L Final  . Calcium  07/11/2013 9.3  8.6 - 10.2 mg/dL Final  . Total Protein 07/11/2013 6.7  6.0 - 8.5 g/dL Final  . Albumin 07/11/2013 4.5  3.5 - 4.7 g/dL Final  . Globulin, Total 07/11/2013 2.2  1.5 - 4.5 g/dL Final  . Albumin/Globulin Ratio 07/11/2013 2.0  1.1 - 2.5 Final  . Total Bilirubin 07/11/2013 0.3  0.0 - 1.2 mg/dL Final  . Alkaline Phosphatase 07/11/2013 92  39 - 117 IU/L Final  . AST 07/11/2013 30  0 - 40 IU/L Final  . ALT 07/11/2013 31  0 - 32 IU/L Final  . TSH 07/11/2013 5.360* 0.450 - 4.500 uIU/mL Final  Appointment on 05/13/2013  Component Date Value Range Status  . Glucose 05/13/2013 128* 65 - 99 mg/dL Final  . BUN 05/13/2013 22  8 - 27 mg/dL Final  . Creatinine, Ser 05/13/2013 1.23* 0.57 - 1.00 mg/dL Final  . GFR calc non Af Amer 05/13/2013 40* >59 mL/min/1.73 Final  . GFR calc Af Amer 05/13/2013 46* >59 mL/min/1.73 Final  . BUN/Creatinine Ratio 05/13/2013 18  11 - 26 Final  . Sodium 05/13/2013 143  134 - 144 mmol/L Final  . Potassium 05/13/2013 4.9  3.5 - 5.2 mmol/L Final  . Chloride 05/13/2013 100  97 - 108 mmol/L Final  . CO2 05/13/2013 28  18 - 29 mmol/L Final  . Calcium 05/13/2013 9.3  8.6 - 10.2 mg/dL Final  . Cholesterol, Total 05/13/2013 159  100 - 199 mg/dL Final  . Triglycerides 05/13/2013 180* 0 - 149 mg/dL Final  . HDL 05/13/2013 27* >39 mg/dL Final   Comment: According to ATP-III Guidelines, HDL-C >59 mg/dL is considered a                          negative risk factor for CHD.  Marland Kitchen  VLDL Cholesterol Cal 05/13/2013 36  5 - 40 mg/dL Final  . LDL Calculated 05/13/2013 96  0 - 99 mg/dL Final  . Chol/HDL Ratio 05/13/2013 5.9* 0.0 - 4.4 ratio units Final   Comment:                                   T. Chol/HDL Ratio                                                                      Men  Women                                                        1/2 Avg.Risk  3.4    3.3                                                            Avg.Risk  5.0    4.4                                                          2X Avg.Risk  9.6    7.1                                                         3X Avg.Risk 23.4   11.0  . Hemoglobin A1C 05/13/2013 6.8* 4.8 - 5.6 % Final   Comment:          Increased risk for diabetes: 5.7 - 6.4                                   Diabetes: >6.4                                   Glycemic control for adults with diabetes: <7.0  . Estimated average glucose 05/13/2013 148   Final  . TSH 05/13/2013 3.980  0.450 - 4.500 uIU/mL Final      Assessment/Plan  Type II or unspecified type diabetes mellitus with renal manifestations, not stated as uncontrolled(250.40): controlled   - Plan: Hemoglobin 123456, Basic metabolic panel, Microalbumin/Creatinine Ratio, Urine  Immunization deficiency - Plan: Tdap (BOOSTRIX) 5-2.5-18.5 LF-MCG/0.5 injection  Tremor: unchanged  Unspecified essential hypertension: controlled  Cramp in muscle: : stopped cyclobenzaprine. Try Skelaxin  Chronic kidney disease, stage III (moderate): unchanged  Acid reflux: controlled  Dysphagia, unspecified: improved  SOB (shortness of breath): unchanged  Spinal stenosis, unspecified region other than cervical: unchanged  Osteoarthritis of left knee - Plan: HYDROcodone-acetaminophen (NORCO/VICODIN) 5-325 MG per tablet  Cramp in limb - Plan: try metaxalone (SKELAXIN) 800 MG tablet  Other and unspecified hyperlipidemia - Plan: Lipid panel

## 2013-07-16 NOTE — Patient Instructions (Addendum)
Stop cyclobenzaprine and metoclopramide. Start Metaxalone for the leg cramps.

## 2013-07-24 ENCOUNTER — Other Ambulatory Visit: Payer: Self-pay | Admitting: *Deleted

## 2013-07-24 MED ORDER — TIZANIDINE HCL 4 MG PO CAPS: ORAL_CAPSULE | ORAL | Status: DC

## 2013-07-24 NOTE — Telephone Encounter (Signed)
Patient caregiver Mr Karen [name] called cell #: [phone] and stated that the insurance would not cover the Skelaxin that Dr. Nyoka Cowden prescribed and the Flexeril does not work. Can we call in something else for her?  I spoke with Dr. Nyoka Cowden and he changed the medication to Tizanidine 4mg  One tablet by mouth as needed to relax muscles. Mr. Karen [name] Notified and faxed Rx into [place].

## 2013-07-29 ENCOUNTER — Encounter: Payer: Self-pay | Admitting: Internal Medicine

## 2013-07-29 ENCOUNTER — Ambulatory Visit (INDEPENDENT_AMBULATORY_CARE_PROVIDER_SITE_OTHER): Payer: Medicare Other | Admitting: Internal Medicine

## 2013-07-29 VITALS — BP 150/70 | HR 75 | Temp 98.0°F | Resp 16 | Wt 135.6 lb

## 2013-07-29 DIAGNOSIS — J209 Acute bronchitis, unspecified: Secondary | ICD-10-CM

## 2013-07-29 MED ORDER — AZITHROMYCIN 250 MG PO TABS: ORAL_TABLET | ORAL | Status: DC

## 2013-07-29 MED ORDER — HYDROCOD POLST-CHLORPHEN POLST 10-8 MG/5ML PO LQCR: 5.0000 mL | Freq: Two times a day (BID) | ORAL | Status: DC

## 2013-07-29 NOTE — Progress Notes (Signed)
Patient ID: Karen [name], female   DOB: [birth year], 79 y.o.   MRN: [id number]   Location:  [facility]  Allergies  Allergen Reactions  . Celebrex [Celecoxib] Rash  . Doxycycline Rash  . Lyrica [Pregabalin] Rash  . Avandia [Rosiglitazone]   . Macrodantin [Nitrofurantoin Macrocrystal]   . Sulfa Antibiotics   . Vioxx [Rofecoxib]     Chief Complaint  Patient presents with  . Acute Visit    chest congestion, aching all over, weak, no appetite, SOB and coughing x 7 days.  she has taken 2 bottles of (BP) Coricedin with no relief.    HPI: Patient is a 79 y.o. female seen in the office today for acute visit due to chest congestion, aching all over, no appetite, shortness of breath and coughing for 7 days.  She has used up 2 bottles of tussin diabetic cough syrup with no relief.  No fever that she knows of.  First night she had chills.  Was in bed all day Christmas Eve.  Coughing is down her chest and ribs and chest are hurting.  Lost 5 lbs since last week.  No sick contacts.    Review of Systems:  Review of Systems  Constitutional: Positive for chills, weight loss and malaise/fatigue. Negative for fever.  HENT: Positive for congestion and sore throat. Negative for nosebleeds.   Eyes: Negative for blurred vision.  Respiratory: Positive for cough and shortness of breath.   Cardiovascular: Positive for chest pain. Negative for leg swelling.       Tightness of chest and pain over ribs from coughing  Gastrointestinal: Negative for vomiting and diarrhea.       Poor appetite  Genitourinary: Negative for dysuria.  Musculoskeletal: Positive for myalgias.  Neurological: Negative for dizziness and headaches.     Past Medical History  Diagnosis Date  . Anemia   . Stroke   . Irregular heartbeat   . Sleep apnea   . Arthritis     body, neck  . TIA (transient ischemic attack) 2012  . Bladder disorder     over active  . Cancer     skin,  removed  . Hiatal hernia   . Benign hypertensive heart disease   . CHF (congestive heart failure)   . Vitamin B12 deficiency   . Gastroparesis   . Hyperlipidemia   . Anxiety   . A-fib   . Spinal stenosis   . GERD (gastroesophageal reflux disease)   . Esophageal stricture   . Peripheral vascular disease, unspecified   . Nausea alone   . Diarrhea   . Abnormal involuntary movements(781.0)   . Inflammatory disease of breast   . Transient ischemic attack (TIA), and cerebral infarction without residual deficits(V12.54)   . Urinary tract infection, site not specified   . Benign hypertensive heart disease with congestive heart failure   . Congestive heart failure, unspecified   . Nonspecific (abnormal) findings on radiological and other examination of skull and head   . Unspecified pruritic disorder   . Inflamed seborrheic keratosis   . Seborrheic keratosis   . Other B-complex deficiencies   . Chronic kidney disease, stage III (moderate)   . Type II or unspecified type diabetes mellitus with renal manifestations, not stated as uncontrolled(250.40)   . Postmenopausal atrophic vaginitis   . Palpitations   . Mixed incontinence urge and stress (female)(female)   . Contact dermatitis and other eczema, due to unspecified cause   . Unspecified  vitamin D deficiency   . Corns and callosities   . Gastroparesis   . Other specified visual disturbances   . Spasm of muscle   . Edema   . Pain in joint, lower leg   . Other and unspecified hyperlipidemia   . Anxiety state, unspecified   . Unspecified hereditary and idiopathic peripheral neuropathy   . Unspecified constipation   . Unspecified essential hypertension   . Unspecified hypothyroidism   . Reflux esophagitis   . Other functional disorder of bladder   . Osteoarthrosis, unspecified whether generalized or localized, unspecified site   . Cervicalgia   . Spinal stenosis, unspecified region other than cervical   . Lumbago   . Unspecified  sleep apnea   . Other malaise and fatigue   . Stricture and stenosis of esophagus     Past Surgical History  Procedure Laterality Date  . Back surgery      x 2  . Abdominal hysterectomy    . Thyroidectomy, partial  1965  . Cholecystectomy  1991  . Hammer toes    . Eye surgery  1994, 1997    bilateral cataract  . Hernia repair  1984aaaaaaaa  . Skin cancer destruction      Social History:   reports that she has never smoked. She has never used smokeless tobacco. She reports that she does not drink alcohol or use illicit drugs.  Family History  Problem Relation Age of Onset  . Cancer Mother     ? stomach  . Heart disease Father   . Hyperlipidemia Father   . Hypertension Father   . Heart attack Father   . Heart disease Brother   . Kidney disease Daughter   . Heart disease Brother   . Cancer Son     Medications: Patient's Medications  New Prescriptions   No medications on file  Previous Medications   ALCOHOL SWABS (ALCOHOL PREP) 70 % PADS       ALPRAZOLAM (XANAX) 0.5 MG TABLET    Take one tablet by mouth up to 3 times daily as needed for anxiety or sleep   ASPIRIN 325 MG TABLET    Take 325 mg by mouth daily.   BLOOD GLUCOSE MONITORING SUPPL (BAYER BREEZE 2 SYSTEM) W/DEVICE KIT       CRANBERRY PO    Take 300 mg by mouth daily.   DESOXIMETASONE (TOPICORT) 0.25 % CREAM    Apply to lesion daily   DIGOXIN (LANOXIN) 0.125 MG TABLET    Take on Monday, Wednesday, Friday and Saturday   DIPHENHYDRAMINE-ZINC ACETATE (BENADRYL ITCH STOPPING) CREAM    Apply 1 application topically 3 (three) times daily as needed.   EASY COMFORT INSULIN SYRINGE 30G X 1/2" 0.5 ML MISC       ERGOCALCIFEROL (VITAMIN D2) 50000 UNITS CAPSULE    Take 1 capsule (50,000 Units total) by mouth once a week.   GLUCOSE BLOOD (BAYER BREEZE 2 TEST VI)    Check blood sugar 3 x daily as directed. DX:250.02   HYDROCHLOROTHIAZIDE (HYDRODIURIL) 25 MG TABLET    Take 1 tablet (25 mg total) by mouth daily.    HYDROCODONE-ACETAMINOPHEN (NORCO/VICODIN) 5-325 MG PER TABLET    Take 1 tablet 4 times a day as needed for pain   INSULIN GLARGINE (LANTUS SOLOSTAR) 100 UNIT/ML SOPN    Inject 40 Units into the skin daily.   INSULIN PEN NEEDLE 29G X 12.7MM MISC    Use as directed to administer insulin. DX: 250.40  LANCET DEVICES (LANCING DEVICE) MISC       LEVOTHYROXINE (SYNTHROID, LEVOTHROID) 100 MCG TABLET    Take 1 tablet by mouth daily.   NOVOLOG FLEXPEN 100 UNIT/ML INJECTION    10-12 units (am), 12 units noon, 14 units qhs   OMEPRAZOLE (PRILOSEC) 40 MG CAPSULE    Take 40 mg by mouth daily. Two tablets daily   PROMETHAZINE (PHENERGAN) 25 MG TABLET    Take 25 mg by mouth every 6 (six) hours as needed.   TDAP (BOOSTRIX) 5-2.5-18.5 LF-MCG/0.5 INJECTION    Inject 0.5 mLs into the muscle once.   TIZANIDINE (ZANAFLEX) 4 MG CAPSULE    Take one tablet by mouth three times daily as needed to relax muscles.   TRIMETHOPRIM (TRIMPEX) 100 MG TABLET    Take one tablet by mouth every night at bedtime to prevent bladder infection   VITAMIN E PO    Take 400 Units by mouth daily.  Modified Medications   No medications on file  Discontinued Medications   No medications on file     Physical Exam: Filed Vitals:   07/29/13 1528  BP: 150/70  Pulse: 75  Temp: 98 F (36.7 C)  TempSrc: Oral  Resp: 16  Weight: 135 lb 9.6 oz (61.508 kg)  SpO2: 93%  Physical Exam  Constitutional: She is oriented to person, place, and time. No distress.  HENT:  Head: Normocephalic and atraumatic.  Right Ear: External ear normal.  Left Ear: External ear normal.  Nose: Nose normal.  Mouth/Throat: Oropharynx is clear and moist. No oropharyngeal exudate.  Eyes: Conjunctivae and EOM are normal. Pupils are equal, round, and reactive to light.  Neck: Neck supple.  Cardiovascular: Normal rate, regular rhythm, normal heart sounds and intact distal pulses.   Pulmonary/Chest: Effort normal. No respiratory distress.  Coarse rhonchi especially  anterior exam, few on posterior exam in upper lung fields  Abdominal: Soft. Bowel sounds are normal.  Musculoskeletal: Normal range of motion.  Lymphadenopathy:    She has no cervical adenopathy.  Neurological: She is alert and oriented to person, place, and time.  Skin: Skin is warm and dry.    Labs reviewed: Basic Metabolic Panel:  Recent Labs  02/04/13 0903 05/13/13 0858 07/11/13 0917  NA 141 143 144  K 4.5 4.9 4.6  CL 98 100 99  CO2 29 28 28   GLUCOSE 117* 128* 116*  BUN 20 22 21   CREATININE 1.24* 1.23* 1.28*  CALCIUM 9.7 9.3 9.3  TSH  --  3.980 5.360*   Liver Function Tests:  Recent Labs  07/11/13 0917  AST 30  ALT 31  ALKPHOS 92  BILITOT 0.3  PROT 6.7  Lipid Panel:  Recent Labs  02/04/13 0903 05/13/13 0858  HDL 30* 27*  LDLCALC 92 96  TRIG 189* 180*  CHOLHDL 5.3* 5.9*   Lab Results  Component Value Date   HGBA1C 6.3* 07/11/2013   Assessment/Plan 1. Acute bronchitis -has not had fever so less likely flu -we cannot test for flu here either - exam c/w bronchitis so will give:   azithromycin (ZITHROMAX) 250 MG tablet; 2 tabs today and one tab daily for 4 days  Dispense: 6 tablet; Refill: 0 - chlorpheniramine-HYDROcodone (TUSSIONEX PENNKINETIC ER) 10-8 MG/5ML LQCR; Take 5 mLs by mouth 2 (two) times daily.  Dispense: 115 mL; Refill: 0 -hydrate and get plenty of sleep, let us know if she is not getting better in a week or has a fever

## 2013-07-29 NOTE — Patient Instructions (Signed)
Try to get plenty of rest and drink fluids especially water.  Let us know if you are not getting better in another week.

## 2013-08-05 ENCOUNTER — Telehealth: Payer: Self-pay | Admitting: *Deleted

## 2013-08-05 NOTE — Telephone Encounter (Signed)
She must have a yeast infection.  Is her bronchitis better?   For yeast infection:  Diflucan 150mg  po daily x 1, with 1 refill if not better in a week.

## 2013-08-05 NOTE — Telephone Encounter (Signed)
Patient was just in for an appointment and seen for Bronchitis and given a Z-Pac. Patient states when she takes an antibiotic she gets UTI and now she is having burning and frequency. Can we call something into her pharmacy, [facility]? Please Advise

## 2013-08-06 ENCOUNTER — Other Ambulatory Visit: Payer: Self-pay | Admitting: *Deleted

## 2013-08-06 MED ORDER — FLUCONAZOLE 150 MG PO TABS: ORAL_TABLET | ORAL | Status: DC

## 2013-08-06 NOTE — Telephone Encounter (Signed)
Patient Notified and called in Rx into [facility]

## 2013-08-07 ENCOUNTER — Encounter: Payer: Self-pay | Admitting: Internal Medicine

## 2013-08-08 ENCOUNTER — Ambulatory Visit
Admission: RE | Admit: 2013-08-08 | Discharge: 2013-08-08 | Disposition: A | Discharge status: Home / Self Care | Payer: Medicare Other | Source: Ambulatory Visit | Attending: Nurse Practitioner | Admitting: Nurse Practitioner

## 2013-08-08 ENCOUNTER — Ambulatory Visit (INDEPENDENT_AMBULATORY_CARE_PROVIDER_SITE_OTHER): Payer: Medicare Other | Admitting: Nurse Practitioner

## 2013-08-08 ENCOUNTER — Encounter: Payer: Self-pay | Admitting: Nurse Practitioner

## 2013-08-08 VITALS — BP 116/68 | HR 86 | Temp 98.1°F | Wt 137.0 lb

## 2013-08-08 DIAGNOSIS — J209 Acute bronchitis, unspecified: Secondary | ICD-10-CM

## 2013-08-08 DIAGNOSIS — B3731 Acute candidiasis of vulva and vagina: Secondary | ICD-10-CM

## 2013-08-08 DIAGNOSIS — R300 Dysuria: Secondary | ICD-10-CM

## 2013-08-08 DIAGNOSIS — B373 Candidiasis of vulva and vagina: Secondary | ICD-10-CM

## 2013-08-08 LAB — POCT URINALYSIS DIPSTICK
Bilirubin, UA: NEGATIVE
Glucose, UA: NEGATIVE
KETONES UA: NEGATIVE
NITRITE UA: NEGATIVE
PH UA: 6
Protein, UA: NEGATIVE
Spec Grav, UA: 1.01
Urobilinogen, UA: 0.2

## 2013-08-08 MED ORDER — SACCHAROMYCES BOULARDII 250 MG PO CAPS: 250.0000 mg | ORAL_CAPSULE | Freq: Two times a day (BID) | ORAL | Status: DC

## 2013-08-08 MED ORDER — FLUCONAZOLE 100 MG PO TABS: 100.0000 mg | ORAL_TABLET | ORAL | Status: DC

## 2013-08-08 MED ORDER — LEVOFLOXACIN 500 MG PO TABS: 500.0000 mg | ORAL_TABLET | Freq: Every day | ORAL | Status: DC

## 2013-08-08 NOTE — Patient Instructions (Signed)
-will send you for blood work and for xray of chest -will send off urine for culture -increase water intake this will help with urine and to break up congestion  -mucinex DM 1 tablet twice daily for 10 days for cough and congestion  -take suppository when you get home and if you are still constipation once you have taken antibiotic and florastor try SENNA S for constipation   Urinary Tract Infection Urinary tract infections (UTIs) can develop anywhere along your urinary tract. Your urinary tract is your body's drainage system for removing wastes and extra water. Your urinary tract includes two kidneys, two ureters, a bladder, and a urethra. Your kidneys are a pair of bean-shaped organs. Each kidney is about the size of your fist. They are located below your ribs, one on each side of your spine. CAUSES Infections are caused by microbes, which are microscopic organisms, including fungi, viruses, and bacteria. These organisms are so small that they can only be seen through a microscope. Bacteria are the microbes that most commonly cause UTIs. SYMPTOMS  Symptoms of UTIs may vary by age and gender of the patient and by the location of the infection. Symptoms in young women typically include a frequent and intense urge to urinate and a painful, burning feeling in the bladder or urethra during urination. Older women and men are more likely to be tired, shaky, and weak and have muscle aches and abdominal pain. A fever may mean the infection is in your kidneys. Other symptoms of a kidney infection include pain in your back or sides below the ribs, nausea, and vomiting. DIAGNOSIS To diagnose a UTI, your caregiver will ask you about your symptoms. Your caregiver also will ask to provide a urine sample. The urine sample will be tested for bacteria and white blood cells. White blood cells are made by your body to help fight infection. TREATMENT  Typically, UTIs can be treated with medication. Because most UTIs  are caused by a bacterial infection, they usually can be treated with the use of antibiotics. The choice of antibiotic and length of treatment depend on your symptoms and the type of bacteria causing your infection. HOME CARE INSTRUCTIONS  If you were prescribed antibiotics, take them exactly as your caregiver instructs you. Finish the medication even if you feel better after you have only taken some of the medication.  Drink enough water and fluids to keep your urine clear or pale yellow.  Avoid caffeine, tea, and carbonated beverages. They tend to irritate your bladder.  Empty your bladder often. Avoid holding urine for long periods of time.  Empty your bladder before and after sexual intercourse.  After a bowel movement, women should cleanse from front to back. Use each tissue only once. SEEK MEDICAL CARE IF:   You have back pain.  You develop a fever.  Your symptoms do not begin to resolve within 3 days. SEEK IMMEDIATE MEDICAL CARE IF:   You have severe back pain or lower abdominal pain.  You develop chills.  You have nausea or vomiting.  You have continued burning or discomfort with urination. MAKE SURE YOU:   Understand these instructions.  Will watch your condition.  Will get help right away if you are not doing well or get worse. Document Released: 04/27/2005 Document Revised: 01/17/2012 Document Reviewed: 08/26/2011 [facility] Patient Information 2014 [place].  Bronchitis Bronchitis is the body's way of reacting to injury and/or infection (inflammation) of the bronchi. Bronchi are the air tubes that extend from  the windpipe into the lungs. If the inflammation becomes severe, it may cause shortness of breath. CAUSES  Inflammation may be caused by:  A virus.  Germs (bacteria).  Dust.  Allergens.  Pollutants and many other irritants. The cells lining the bronchial tree are covered with tiny hairs (cilia). These constantly beat upward, away from the  lungs, toward the mouth. This keeps the lungs free of pollutants. When these cells become too irritated and are unable to do their job, mucus begins to develop. This causes the characteristic cough of bronchitis. The cough clears the lungs when the cilia are unable to do their job. Without either of these protective mechanisms, the mucus would settle in the lungs. Then you would develop pneumonia. Smoking is a common cause of bronchitis and can contribute to pneumonia. Stopping this habit is the single most important thing you can do to help yourself. TREATMENT   Your caregiver may prescribe an antibiotic if the cough is caused by bacteria. Also, medicines that open up your airways make it easier to breathe. Your caregiver may also recommend or prescribe an expectorant. It will loosen the mucus to be coughed up. Only take over-the-counter or prescription medicines for pain, discomfort, or fever as directed by your caregiver.  Removing whatever causes the problem (smoking, for example) is critical to preventing the problem from getting worse.  Cough suppressants may be prescribed for relief of cough symptoms.  Inhaled medicines may be prescribed to help with symptoms now and to help prevent problems from returning.  For those with recurrent (chronic) bronchitis, there may be a need for steroid medicines. SEEK IMMEDIATE MEDICAL CARE IF:   During treatment, you develop more pus-like mucus (purulent sputum).  You have a fever.  You become progressively more ill.  You have increased difficulty breathing, wheezing, or shortness of breath. It is necessary to seek immediate medical care if you are elderly or sick from any other disease. MAKE SURE YOU:   Understand these instructions.  Will watch your condition.  Will get help right away if you are not doing well or get worse. Document Released: 07/18/2005 Document Revised: 03/20/2013 Document Reviewed: 03/12/2013 [facility] Patient Information  2014 [place].

## 2013-08-08 NOTE — Progress Notes (Signed)
Patient ID: Karen [name], female   DOB: [birth year], 80 y.o.   MRN: [id number]    Allergies  Allergen Reactions  . Celebrex [Celecoxib] Rash  . Doxycycline Rash  . Lyrica [Pregabalin] Rash  . Avandia [Rosiglitazone]   . Macrodantin [Nitrofurantoin Macrocrystal]   . Sulfa Antibiotics   . Vioxx [Rofecoxib]     Chief Complaint  Patient presents with  . URI    Congestion- onging concern   . Dysuria    Patient c/o of pain when urinating x couple of days.     HPI: Patient is a 80 y.o. female seen in the office today for burning with urination Was seen last week and diagnosed with bronchitis and placed on z pack and cough medication; overall doing better with bronchitis; reports she is still having shortness of breath but this is some better; reports she is short of breath at rest (however she does not appear to be in any distress or short of breath) Coughing a lot but Rx medication helps with this has chest pain associated with coughing   Every time she goes to the bathroom she has chills and is in a lot of pain, has frequency with dysuria, no worsening back pain (has chronic back pain), no abdominal pain  Reports constipation   Has attempted to increase water intake  No fevers Review of Systems:  Review of Systems  Constitutional: Positive for malaise/fatigue. Negative for fever and chills.  Respiratory: Positive for cough, sputum production and shortness of breath. Negative for wheezing.   Cardiovascular: Positive for chest pain (when coughing). Negative for leg swelling.  Gastrointestinal: Positive for nausea and constipation. Negative for vomiting, abdominal pain and diarrhea.  Genitourinary: Positive for dysuria, urgency and frequency. Negative for hematuria and flank pain.  Musculoskeletal: Positive for back pain (chronic). Negative for myalgias.  Skin: Negative.   Neurological: Positive for headaches.     Past Medical History  Diagnosis Date  . Anemia   . Stroke    . Irregular heartbeat   . Sleep apnea   . Arthritis     body, neck  . TIA (transient ischemic attack) 2012  . Bladder disorder     over active  . Cancer     skin, removed  . Hiatal hernia   . Benign hypertensive heart disease   . CHF (congestive heart failure)   . Vitamin B12 deficiency   . Gastroparesis   . Hyperlipidemia   . Anxiety   . A-fib   . Spinal stenosis   . GERD (gastroesophageal reflux disease)   . Esophageal stricture   . Peripheral vascular disease, unspecified   . Nausea alone   . Diarrhea   . Abnormal involuntary movements(781.0)   . Inflammatory disease of breast   . Transient ischemic attack (TIA), and cerebral infarction without residual deficits(V12.54)   . Urinary tract infection, site not specified   . Benign hypertensive heart disease with congestive heart failure   . Congestive heart failure, unspecified   . Nonspecific (abnormal) findings on radiological and other examination of skull and head   . Unspecified pruritic disorder   . Inflamed seborrheic keratosis   . Seborrheic keratosis   . Other B-complex deficiencies   . Chronic kidney disease, stage III (moderate)   . Type II or unspecified type diabetes mellitus with renal manifestations, not stated as uncontrolled(250.40)   . Postmenopausal atrophic vaginitis   . Palpitations   . Mixed incontinence urge and stress (female)(female)   . Contact  dermatitis and other eczema, due to unspecified cause   . Unspecified vitamin D deficiency   . Corns and callosities   . Gastroparesis   . Other specified visual disturbances   . Spasm of muscle   . Edema   . Pain in joint, lower leg   . Other and unspecified hyperlipidemia   . Anxiety state, unspecified   . Unspecified hereditary and idiopathic peripheral neuropathy   . Unspecified constipation   . Unspecified essential hypertension   . Unspecified hypothyroidism   . Reflux esophagitis   . Other functional disorder of bladder   . Osteoarthrosis,  unspecified whether generalized or localized, unspecified site   . Cervicalgia   . Spinal stenosis, unspecified region other than cervical   . Lumbago   . Unspecified sleep apnea   . Other malaise and fatigue   . Stricture and stenosis of esophagus    Past Surgical History  Procedure Laterality Date  . Back surgery      x 2  . Abdominal hysterectomy    . Thyroidectomy, partial  1965  . Cholecystectomy  1991  . Hammer toes    . Eye surgery  1994, 1997    bilateral cataract  . Hernia repair  1984aaaaaaaa  . Skin cancer destruction     Social History:   reports that she has never smoked. She has never used smokeless tobacco. She reports that she does not drink alcohol or use illicit drugs.  Family History  Problem Relation Age of Onset  . Cancer Mother     ? stomach  . Heart disease Father   . Hyperlipidemia Father   . Hypertension Father   . Heart attack Father   . Heart disease Brother   . Kidney disease Daughter   . Heart disease Brother   . Cancer Son     Medications: Patient's Medications  New Prescriptions   No medications on file  Previous Medications   ALCOHOL SWABS (ALCOHOL PREP) 70 % PADS       ALPRAZOLAM (XANAX) 0.5 MG TABLET    Take one tablet by mouth up to 3 times daily as needed for anxiety or sleep   ASPIRIN 325 MG TABLET    Take 325 mg by mouth daily.   BLOOD GLUCOSE MONITORING SUPPL (BAYER BREEZE 2 SYSTEM) W/DEVICE KIT       CHLORPHENIRAMINE-HYDROCODONE (TUSSIONEX PENNKINETIC ER) 10-8 MG/5ML LQCR    Take 5 mLs by mouth 2 (two) times daily.   CRANBERRY PO    Take 300 mg by mouth daily.   DESOXIMETASONE (TOPICORT) 0.25 % CREAM    Apply to lesion daily   DIGOXIN (LANOXIN) 0.125 MG TABLET    Take on Monday, Wednesday, Friday and Saturday   DIPHENHYDRAMINE-ZINC ACETATE (BENADRYL ITCH STOPPING) CREAM    Apply 1 application topically 3 (three) times daily as needed.   EASY COMFORT INSULIN SYRINGE 30G X 1/2" 0.5 ML MISC       ERGOCALCIFEROL (VITAMIN D2)  50000 UNITS CAPSULE    Take 1 capsule (50,000 Units total) by mouth once a week.   FLUCONAZOLE (DIFLUCAN) 150 MG TABLET    Take one tablet po daily x 1, with 1 refill if not better in a week.   GLUCOSE BLOOD (BAYER BREEZE 2 TEST VI)    Check blood sugar 3 x daily as directed. DX:250.02   HYDROCHLOROTHIAZIDE (HYDRODIURIL) 25 MG TABLET    Take 1 tablet (25 mg total) by mouth daily.   HYDROCODONE-ACETAMINOPHEN (NORCO/VICODIN) 5-325 MG  PER TABLET    Take 1 tablet 4 times a day as needed for pain   INSULIN GLARGINE (LANTUS SOLOSTAR) 100 UNIT/ML SOPN    Inject 40 Units into the skin daily.   INSULIN PEN NEEDLE 29G X 12.7MM MISC    Use as directed to administer insulin. DX: 250.40   LANCET DEVICES (LANCING DEVICE) MISC       LEVOTHYROXINE (SYNTHROID, LEVOTHROID) 100 MCG TABLET    Take 1 tablet by mouth daily.   NOVOLOG FLEXPEN 100 UNIT/ML INJECTION    10-12 units (am), 12 units noon, 14 units qhs   OMEPRAZOLE (PRILOSEC) 40 MG CAPSULE    Take 40 mg by mouth daily. Two tablets daily   PROMETHAZINE (PHENERGAN) 25 MG TABLET    Take 25 mg by mouth every 6 (six) hours as needed.   TIZANIDINE (ZANAFLEX) 4 MG CAPSULE    Take one tablet by mouth three times daily as needed to relax muscles.   TRIMETHOPRIM (TRIMPEX) 100 MG TABLET    Take one tablet by mouth every night at bedtime to prevent bladder infection   VITAMIN E PO    Take 400 Units by mouth daily.  Modified Medications   No medications on file  Discontinued Medications   AZITHROMYCIN (ZITHROMAX) 250 MG TABLET    2 tabs today and one tab daily for 4 days   TDAP (BOOSTRIX) 5-2.5-18.5 LF-MCG/0.5 INJECTION    Inject 0.5 mLs into the muscle once.     Physical Exam:  Filed Vitals:   08/08/13 1310  BP: 116/68  Pulse: 86  Weight: 137 lb (62.143 kg)  SpO2: 95%    Physical Exam  Constitutional:  Ill appearing frail female in NAD  HENT:  Head: Normocephalic and atraumatic.  Right Ear: External ear normal.  Left Ear: External ear normal.  Nose:  Nose normal.  Mouth/Throat: Oropharynx is clear and moist. No oropharyngeal exudate.  Eyes: Conjunctivae and EOM are normal. Pupils are equal, round, and reactive to light.  Neck: Normal range of motion. Neck supple. No thyromegaly present.  Cardiovascular: Normal rate, regular rhythm and normal heart sounds.   Pulmonary/Chest: Effort normal and breath sounds normal. No respiratory distress. She has no wheezes.  Abdominal: Soft. Bowel sounds are normal. She exhibits no distension.  Musculoskeletal: Normal range of motion.  Lymphadenopathy:    She has no cervical adenopathy.  Neurological: She is alert.  Skin: Skin is warm and dry. She is not diaphoretic.     Labs reviewed: Basic Metabolic Panel:  Recent Labs  02/04/13 0903 05/13/13 0858 07/11/13 0917  NA 141 143 144  K 4.5 4.9 4.6  CL 98 100 99  CO2 $Re'29 28 28  'RVb$ GLUCOSE 117* 128* 116*  BUN $Re'20 22 21  'Spp$ CREATININE 1.24* 1.23* 1.28*  CALCIUM 9.7 9.3 9.3  TSH  --  3.980 5.360*   Liver Function Tests:  Recent Labs  07/11/13 0917  AST 30  ALT 31  ALKPHOS 92  BILITOT 0.3  PROT 6.7   No results found for this basename: LIPASE, AMYLASE,  in the last 8760 hours No results found for this basename: AMMONIA,  in the last 8760 hours CBC: No results found for this basename: WBC, NEUTROABS, HGB, HCT, MCV, PLT,  in the last 8760 hours Lipid Panel:  Recent Labs  02/04/13 0903 05/13/13 0858  HDL 30* 27*  LDLCALC 92 96  TRIG 189* 180*  CHOLHDL 5.3* 5.9*   TSH:  Recent Labs  05/13/13 0858 07/11/13 9678  TSH 3.980 5.360*   A1C: No components found with this basename: A1C,    Assessment/Plan 1. Dysuria -UA appears like pt has UTI -encouraged to increase hydration - Urine culture sent - CBC With differential/Platelet - Basic metabolic panel  2. Acute bronchitis -cough and congestion without much improvement -mucinex DM BID for 14 days with full glass of water - CBC With differential/Platelet - DG Chest 1 View;   To rule out Pneumonia  - saccharomyces boulardii (FLORASTOR) 250 MG capsule; Take 1 capsule (250 mg total) by mouth 2 (two) times daily.  Dispense: 30 capsule; Refill: 0 - Basic metabolic panel  3. Yeast infection of the vagina - fluconazole (DIFLUCAN) 100 MG tablet; Take 1 tablet (100 mg total) by mouth every other day.  Dispense: 4 tablet; Refill: 0   Instructions given on when to RTC or seek emergency medicine; pt understands

## 2013-08-09 LAB — CBC WITH DIFFERENTIAL
BASOS: 0 %
Basophils Absolute: 0 10*3/uL (ref 0.0–0.2)
EOS: 1 %
Eosinophils Absolute: 0.2 10*3/uL (ref 0.0–0.4)
HCT: 36.7 % (ref 34.0–46.6)
HEMOGLOBIN: 11.8 g/dL (ref 11.1–15.9)
Immature Grans (Abs): 0 10*3/uL (ref 0.0–0.1)
Immature Granulocytes: 0 %
LYMPHS: 23 %
Lymphocytes Absolute: 3.3 10*3/uL — ABNORMAL HIGH (ref 0.7–3.1)
MCH: 29.9 pg (ref 26.6–33.0)
MCHC: 32.2 g/dL (ref 31.5–35.7)
MCV: 93 fL (ref 79–97)
Monocytes Absolute: 1.1 10*3/uL — ABNORMAL HIGH (ref 0.1–0.9)
Monocytes: 7 %
NEUTROS PCT: 69 %
Neutrophils Absolute: 9.6 10*3/uL — ABNORMAL HIGH (ref 1.4–7.0)
Platelets: 483 10*3/uL — ABNORMAL HIGH (ref 150–379)
RBC: 3.94 x10E6/uL (ref 3.77–5.28)
RDW: 13 % (ref 12.3–15.4)
WBC: 14.2 10*3/uL — ABNORMAL HIGH (ref 3.4–10.8)

## 2013-08-09 LAB — BASIC METABOLIC PANEL
BUN/Creatinine Ratio: 18 (ref 11–26)
BUN: 30 mg/dL — AB (ref 8–27)
CO2: 25 mmol/L (ref 18–29)
CREATININE: 1.67 mg/dL — AB (ref 0.57–1.00)
Calcium: 9.4 mg/dL (ref 8.6–10.2)
Chloride: 97 mmol/L (ref 97–108)
GFR calc Af Amer: 31 mL/min/{1.73_m2} — ABNORMAL LOW (ref >59)
GFR calc non Af Amer: 27 mL/min/{1.73_m2} — ABNORMAL LOW (ref >59)
Glucose: 94 mg/dL (ref 65–99)
Potassium: 5.3 mmol/L — ABNORMAL HIGH (ref 3.5–5.2)
Sodium: 139 mmol/L (ref 134–144)

## 2013-08-10 LAB — URINE CULTURE

## 2013-08-12 ENCOUNTER — Telehealth: Payer: Self-pay | Admitting: *Deleted

## 2013-08-12 ENCOUNTER — Other Ambulatory Visit: Payer: Self-pay | Admitting: *Deleted

## 2013-08-12 MED ORDER — AMOXICILLIN-POT CLAVULANATE 875-125 MG PO TABS: 1.0000 | ORAL_TABLET | Freq: Two times a day (BID) | ORAL | Status: DC

## 2013-08-12 MED ORDER — OMEPRAZOLE 40 MG PO CPDR: DELAYED_RELEASE_CAPSULE | ORAL | Status: DC

## 2013-08-12 NOTE — Telephone Encounter (Signed)
rx filled per Jessica's instructions from lab results.

## 2013-08-13 ENCOUNTER — Telehealth: Payer: Self-pay | Admitting: *Deleted

## 2013-08-13 NOTE — Telephone Encounter (Signed)
Son called and just wanted the labs explained to him regarding the urine Culture. Stated that his mother started on the antibiotic yesterday and is tolerating it well. Lantus Insulin samples also left for patient to pick up

## 2013-08-14 ENCOUNTER — Other Ambulatory Visit: Payer: Self-pay | Admitting: *Deleted

## 2013-08-14 MED ORDER — PROMETHAZINE HCL 25 MG PO TABS: ORAL_TABLET | ORAL | Status: DC

## 2013-08-14 NOTE — Telephone Encounter (Signed)
Patient son called and stated that his mother was having nausea. Per Dr. Bubba Camp call in Phenergan 25 mg One every 8 hours as needed for nausea for 3 days and take with food. Son Notified and faxed to [facility]

## 2013-08-15 ENCOUNTER — Encounter: Payer: Self-pay | Admitting: Nurse Practitioner

## 2013-08-15 ENCOUNTER — Other Ambulatory Visit: Payer: Self-pay | Admitting: *Deleted

## 2013-08-15 ENCOUNTER — Ambulatory Visit (INDEPENDENT_AMBULATORY_CARE_PROVIDER_SITE_OTHER): Payer: Medicare Other | Admitting: Nurse Practitioner

## 2013-08-15 VITALS — BP 140/62 | HR 94 | Temp 98.1°F | Resp 16 | Wt 131.4 lb

## 2013-08-15 DIAGNOSIS — R059 Cough, unspecified: Secondary | ICD-10-CM

## 2013-08-15 DIAGNOSIS — K5900 Constipation, unspecified: Secondary | ICD-10-CM

## 2013-08-15 DIAGNOSIS — R05 Cough: Secondary | ICD-10-CM

## 2013-08-15 DIAGNOSIS — N390 Urinary tract infection, site not specified: Secondary | ICD-10-CM

## 2013-08-15 MED ORDER — INSULIN ASPART 100 UNIT/ML ~~LOC~~ SOLN: SUBCUTANEOUS | Status: DC

## 2013-08-15 NOTE — Patient Instructions (Signed)
amitiza samples given- take twice daily with food  symbicort 2 puffs twice daily for lungs   Cough can last up to 6 weeks but hopefully the symbicort will help  Follow up in 2 weeks

## 2013-08-15 NOTE — Progress Notes (Signed)
Patient ID: Karen [name], female   DOB: [birth year], 80 y.o.   MRN: [id number]    Allergies  Allergen Reactions  . Celebrex [Celecoxib] Rash  . Doxycycline Rash  . Lyrica [Pregabalin] Rash  . Avandia [Rosiglitazone]   . Macrodantin [Nitrofurantoin Macrocrystal]   . Sulfa Antibiotics   . Vioxx [Rofecoxib]     Chief Complaint  Patient presents with  . Follow-up    1 week f/u   . other    still having cough, nausea, constipation, no appitite, and SOB with walking any distances. and HA's    HPI: Patient is a 80 y.o. female seen in the office today for follow up; urinary infection is now better after the change in antibiotics; reports urinating is not hurting as much as it was; has increase fluid intake  Still having cough and shortness of breath; xray showed COPD but pt does not have this has a diagnosis; nonsmoker and never has smoked Still with decreased appetite and ongoing constipation without success from OTC and needs relief  No fevers or chills; but still feels worn  out  Review of Systems:  Review of Systems  Constitutional: Positive for malaise/fatigue. Negative for fever and chills.  Respiratory: Positive for cough, sputum production and shortness of breath. Negative for wheezing.        Doing a lot of coughing and gets a little up  Cardiovascular: Positive for chest pain (when coughing). Negative for palpitations and leg swelling.  Gastrointestinal: Positive for nausea and constipation (unchanged). Negative for vomiting, abdominal pain and diarrhea.  Genitourinary: Positive for dysuria, urgency and frequency. Negative for hematuria and flank pain.       Dysuria, urgency and freq have improved; yeast is better  Musculoskeletal: Positive for back pain (chronic). Negative for myalgias.  Skin: Negative.   Neurological: Positive for headaches.     Past Medical History  Diagnosis Date  . Anemia   . Stroke   . Irregular heartbeat   . Sleep apnea   . Arthritis    body, neck  . TIA (transient ischemic attack) 2012  . Bladder disorder     over active  . Cancer     skin, removed  . Hiatal hernia   . Benign hypertensive heart disease   . CHF (congestive heart failure)   . Vitamin B12 deficiency   . Gastroparesis   . Hyperlipidemia   . Anxiety   . A-fib   . Spinal stenosis   . GERD (gastroesophageal reflux disease)   . Esophageal stricture   . Peripheral vascular disease, unspecified   . Nausea alone   . Diarrhea   . Abnormal involuntary movements(781.0)   . Inflammatory disease of breast   . Transient ischemic attack (TIA), and cerebral infarction without residual deficits(V12.54)   . Urinary tract infection, site not specified   . Benign hypertensive heart disease with congestive heart failure   . Congestive heart failure, unspecified   . Nonspecific (abnormal) findings on radiological and other examination of skull and head   . Unspecified pruritic disorder   . Inflamed seborrheic keratosis   . Seborrheic keratosis   . Other B-complex deficiencies   . Chronic kidney disease, stage III (moderate)   . Type II or unspecified type diabetes mellitus with renal manifestations, not stated as uncontrolled   . Postmenopausal atrophic vaginitis   . Palpitations   . Mixed incontinence urge and stress (female)(female)   . Contact dermatitis and other eczema, due to unspecified cause   .  Unspecified vitamin D deficiency   . Corns and callosities   . Gastroparesis   . Other specified visual disturbances   . Spasm of muscle   . Edema   . Pain in joint, lower leg   . Other and unspecified hyperlipidemia   . Anxiety state, unspecified   . Unspecified hereditary and idiopathic peripheral neuropathy   . Unspecified constipation   . Unspecified essential hypertension   . Unspecified hypothyroidism   . Reflux esophagitis   . Other functional disorder of bladder   . Osteoarthrosis, unspecified whether generalized or localized, unspecified site   .  Cervicalgia   . Spinal stenosis, unspecified region other than cervical   . Lumbago   . Unspecified sleep apnea   . Other malaise and fatigue   . Stricture and stenosis of esophagus    Past Surgical History  Procedure Laterality Date  . Back surgery      x 2  . Abdominal hysterectomy    . Thyroidectomy, partial  1965  . Cholecystectomy  1991  . Hammer toes    . Eye surgery  1994, 1997    bilateral cataract  . Hernia repair  1984aaaaaaaa  . Skin cancer destruction     Social History:   reports that she has never smoked. She has never used smokeless tobacco. She reports that she does not drink alcohol or use illicit drugs.  Family History  Problem Relation Age of Onset  . Cancer Mother     ? stomach  . Heart disease Father   . Hyperlipidemia Father   . Hypertension Father   . Heart attack Father   . Heart disease Brother   . Kidney disease Daughter   . Heart disease Brother   . Cancer Son     Medications: Patient's Medications  New Prescriptions   No medications on file  Previous Medications   ALCOHOL SWABS (ALCOHOL PREP) 70 % PADS       ALPRAZOLAM (XANAX) 0.5 MG TABLET    Take one tablet by mouth up to 3 times daily as needed for anxiety or sleep   AMOXICILLIN-CLAVULANATE (AUGMENTIN) 875-125 MG PER TABLET    Take 1 tablet by mouth 2 (two) times daily.   ASPIRIN 325 MG TABLET    Take 325 mg by mouth daily.   BLOOD GLUCOSE MONITORING SUPPL (BAYER BREEZE 2 SYSTEM) W/DEVICE KIT       CHLORPHENIRAMINE-HYDROCODONE (TUSSIONEX PENNKINETIC ER) 10-8 MG/5ML LQCR    Take 5 mLs by mouth 2 (two) times daily.   CRANBERRY PO    Take 300 mg by mouth daily.   DESOXIMETASONE (TOPICORT) 0.25 % CREAM    Apply to lesion daily   DIGOXIN (LANOXIN) 0.125 MG TABLET    Take on Monday, Wednesday, Friday and Saturday   DIPHENHYDRAMINE-ZINC ACETATE (BENADRYL ITCH STOPPING) CREAM    Apply 1 application topically 3 (three) times daily as needed.   EASY COMFORT INSULIN SYRINGE 30G X 1/2" 0.5 ML  MISC       ERGOCALCIFEROL (VITAMIN D2) 50000 UNITS CAPSULE    Take 1 capsule (50,000 Units total) by mouth once a week.   FLUCONAZOLE (DIFLUCAN) 100 MG TABLET    Take 1 tablet (100 mg total) by mouth every other day.   GLUCOSE BLOOD (BAYER BREEZE 2 TEST VI)    Check blood sugar 3 x daily as directed. DX:250.02   HYDROCHLOROTHIAZIDE (HYDRODIURIL) 25 MG TABLET    Take 1 tablet (25 mg total) by mouth daily.  HYDROCODONE-ACETAMINOPHEN (NORCO/VICODIN) 5-325 MG PER TABLET    Take 1 tablet 4 times a day as needed for pain   INSULIN ASPART (NOVOLOG FLEXPEN) 100 UNIT/ML INJECTION    Inject 10 units before breakfast, 12 units before lunch, and 14 units before supper to control blood sugar. May use up to 3 additional units at each meal as needed   INSULIN GLARGINE (LANTUS SOLOSTAR) 100 UNIT/ML SOPN    Inject 40 Units into the skin daily.   INSULIN PEN NEEDLE 29G X 12.7MM MISC    Use as directed to administer insulin. DX: 250.40   LANCET DEVICES (LANCING DEVICE) MISC       LEVOTHYROXINE (SYNTHROID, LEVOTHROID) 100 MCG TABLET    Take 1 tablet by mouth daily.   OMEPRAZOLE (PRILOSEC) 40 MG CAPSULE    Take one capsule by mouth twice daily   PROMETHAZINE (PHENERGAN) 25 MG TABLET    Take one tablet by mouth every 8 hours as needed for nausea for 3 days. Take with food.   SACCHAROMYCES BOULARDII (FLORASTOR) 250 MG CAPSULE    Take 1 capsule (250 mg total) by mouth 2 (two) times daily.   TIZANIDINE (ZANAFLEX) 4 MG CAPSULE    Take one tablet by mouth three times daily as needed to relax muscles.   TRIMETHOPRIM (TRIMPEX) 100 MG TABLET    Take one tablet by mouth every night at bedtime to prevent bladder infection   VITAMIN E PO    Take 400 Units by mouth daily.  Modified Medications   No medications on file  Discontinued Medications   No medications on file     Physical Exam:  Filed Vitals:   08/15/13 1501  BP: 140/62  Pulse: 94  Temp: 98.1 F (36.7 C)  TempSrc: Oral  Resp: 16  Weight: 131 lb 6.4 oz  (59.603 kg)  Physical Exam  Constitutional:   frail female in NAD  HENT:  Head: Normocephalic and atraumatic.  Right Ear: External ear normal.  Left Ear: External ear normal.  Nose: Nose normal.  Mouth/Throat: Oropharynx is clear and moist. No oropharyngeal exudate.  Eyes: Conjunctivae and EOM are normal. Pupils are equal, round, and reactive to light.  Neck: Normal range of motion. Neck supple. No thyromegaly present.  Cardiovascular: Normal rate, regular rhythm and normal heart sounds.   Pulmonary/Chest: Effort normal and breath sounds normal. No respiratory distress. She has no wheezes.  Abdominal: Soft. Bowel sounds are normal. She exhibits no distension.  Musculoskeletal: Normal range of motion.  Lymphadenopathy:    She has no cervical adenopathy.  Neurological: She is alert.  Skin: Skin is warm and dry. She is not diaphoretic.     Labs reviewed: Basic Metabolic Panel:  Recent Labs  02/04/13 0903 05/13/13 0858 07/11/13 0917 08/08/13 1409  NA 141 143 144 139  K 4.5 4.9 4.6 5.3*  CL 98 100 99 97  CO2 $Re'29 28 28 25  'jni$ GLUCOSE 117* 128* 116* 94  BUN $Re'20 22 21 'Lkl$ 30*  CREATININE 1.24* 1.23* 1.28* 1.67*  CALCIUM 9.7 9.3 9.3 9.4  TSH  --  3.980 5.360*  --    Liver Function Tests:  Recent Labs  07/11/13 0917  AST 30  ALT 31  ALKPHOS 92  BILITOT 0.3  PROT 6.7   No results found for this basename: LIPASE, AMYLASE,  in the last 8760 hours No results found for this basename: AMMONIA,  in the last 8760 hours CBC:  Recent Labs  08/08/13 1409  WBC 14.2*  NEUTROABS  9.6*  HGB 11.8  HCT 36.7  MCV 93  PLT 483*   Lipid Panel:  Recent Labs  02/04/13 0903 05/13/13 0858  HDL 30* 27*  LDLCALC 92 96  TRIG 189* 180*  CHOLHDL 5.3* 5.9*   TSH:  Recent Labs  05/13/13 0858 07/11/13 0917  TSH 3.980 5.360*   A1C: No components found with this basename: A1C,    Assessment/Plan 1. Cough - ongoing with shortness of breath due to increase cough -sample given of  symbicort BID and instructions given on how to use  2. Unspecified constipation -without good results from OTC -pt on chronic opioids which makes constipation worse -samples provided for Amitizia 24 mcg twice daily #14  3. UTI (urinary tract infection) -improved; to cont the remaining course of Augmentin  To follow up in 2 weeks on ongoing cough and shortness of breath and constipation Pt may need pulmonary work up if cough and shortness of breath conts

## 2013-08-26 ENCOUNTER — Other Ambulatory Visit: Payer: Self-pay | Admitting: *Deleted

## 2013-08-26 DIAGNOSIS — M1712 Unilateral primary osteoarthritis, left knee: Secondary | ICD-10-CM

## 2013-08-26 MED ORDER — HYDROCODONE-ACETAMINOPHEN 5-325 MG PO TABS
ORAL_TABLET | ORAL | Status: DC
Start: 2013-08-26 — End: 2013-08-29

## 2013-08-29 ENCOUNTER — Ambulatory Visit
Admission: RE | Admit: 2013-08-29 | Discharge: 2013-08-29 | Disposition: A | Discharge status: Home / Self Care | Payer: Medicare Other | Source: Ambulatory Visit | Attending: Nurse Practitioner | Admitting: Nurse Practitioner

## 2013-08-29 ENCOUNTER — Encounter: Payer: Self-pay | Admitting: *Deleted

## 2013-08-29 ENCOUNTER — Ambulatory Visit (INDEPENDENT_AMBULATORY_CARE_PROVIDER_SITE_OTHER): Payer: Medicare Other | Admitting: Nurse Practitioner

## 2013-08-29 VITALS — BP 128/72 | HR 75 | Resp 10 | Wt 130.0 lb

## 2013-08-29 DIAGNOSIS — IMO0002 Reserved for concepts with insufficient information to code with codable children: Secondary | ICD-10-CM

## 2013-08-29 DIAGNOSIS — W19XXXA Unspecified fall, initial encounter: Secondary | ICD-10-CM

## 2013-08-29 DIAGNOSIS — M79609 Pain in unspecified limb: Secondary | ICD-10-CM

## 2013-08-29 DIAGNOSIS — R110 Nausea: Secondary | ICD-10-CM

## 2013-08-29 DIAGNOSIS — F419 Anxiety disorder, unspecified: Secondary | ICD-10-CM

## 2013-08-29 DIAGNOSIS — R5381 Other malaise: Secondary | ICD-10-CM

## 2013-08-29 DIAGNOSIS — R630 Anorexia: Secondary | ICD-10-CM

## 2013-08-29 DIAGNOSIS — M1712 Unilateral primary osteoarthritis, left knee: Secondary | ICD-10-CM

## 2013-08-29 DIAGNOSIS — M1710 Unilateral primary osteoarthritis, unspecified knee: Secondary | ICD-10-CM

## 2013-08-29 DIAGNOSIS — F411 Generalized anxiety disorder: Secondary | ICD-10-CM

## 2013-08-29 DIAGNOSIS — K5900 Constipation, unspecified: Secondary | ICD-10-CM

## 2013-08-29 DIAGNOSIS — M79641 Pain in right hand: Secondary | ICD-10-CM

## 2013-08-29 DIAGNOSIS — E1129 Type 2 diabetes mellitus with other diabetic kidney complication: Secondary | ICD-10-CM

## 2013-08-29 MED ORDER — LUBIPROSTONE 24 MCG PO CAPS: 24.0000 ug | ORAL_CAPSULE | Freq: Two times a day (BID) | ORAL | Status: DC

## 2013-08-29 MED ORDER — ALPRAZOLAM 0.5 MG PO TABS: ORAL_TABLET | ORAL | Status: DC

## 2013-08-29 MED ORDER — INSULIN GLARGINE 100 UNIT/ML SOLOSTAR PEN: 10.0000 [IU] | PEN_INJECTOR | Freq: Every day | SUBCUTANEOUS | Status: DC

## 2013-08-29 MED ORDER — HYDROCODONE-ACETAMINOPHEN 5-325 MG PO TABS: ORAL_TABLET | ORAL | Status: DC

## 2013-08-29 MED ORDER — MIRTAZAPINE 15 MG PO TABS: 15.0000 mg | ORAL_TABLET | Freq: Every day | ORAL | Status: DC

## 2013-08-29 NOTE — Progress Notes (Signed)
Patient ID: Karen [name], female   DOB: 1924-02-06, 80 y.o.   MRN: [id number]    Allergies  Allergen Reactions  . Celebrex [Celecoxib] Rash  . Doxycycline Rash  . Lyrica [Pregabalin] Rash  . Avandia [Rosiglitazone]   . Macrodantin [Nitrofurantoin Macrocrystal]   . Sulfa Antibiotics   . Vioxx [Rofecoxib]     Chief Complaint  Patient presents with  . Follow-up    2 week f/u on Bronchitis, constipation and UTI  . Fall    Patient fell out of shower this am, patient with right hand bruising . Pain on left side related to fall  . Medication Refill    [facility] rx for Xanax and Hydrocodone ( rx was printed on 08/26/2013, unable to locate)     HPI: Patient is a 80 y.o. female seen in the office today for follow up; has been weak and feel getting out of the shower-- hit hand and elbow; didnt hit head; no neurological deficits; some bruising to hand but still with ROM and strength that she had prior to fall  Has had bronchitis with bad cough; put symbicort BID-- she could not tell any difference when she was using it; ran out does not want more-- still coughing but overall is better Was placed on amitizia and is taking this daily-- works well, no longer constipated.  Has been very weak and decondition  Review of Systems:  Review of Systems  Constitutional: Negative for fever, chills and malaise/fatigue.  Respiratory: Positive for cough and shortness of breath. Negative for sputum production and wheezing.        Chronic shortness of breath with activity   Cardiovascular: Negative for chest pain, palpitations and leg swelling.  Gastrointestinal: Positive for nausea and constipation (unchanged). Negative for vomiting, abdominal pain and diarrhea.  Genitourinary: Negative for dysuria, urgency and frequency.  Musculoskeletal: Positive for back pain (chronic). Negative for myalgias.  Skin: Negative.   Neurological: Positive for weakness and headaches. Negative for dizziness, tingling, sensory  change, speech change and focal weakness.     Past Medical History  Diagnosis Date  . Anemia   . Stroke   . Irregular heartbeat   . Sleep apnea   . Arthritis     body, neck  . TIA (transient ischemic attack) 2012  . Bladder disorder     over active  . Cancer     skin, removed  . Hiatal hernia   . Benign hypertensive heart disease   . CHF (congestive heart failure)   . Vitamin B12 deficiency   . Gastroparesis   . Hyperlipidemia   . Anxiety   . A-fib   . Spinal stenosis   . GERD (gastroesophageal reflux disease)   . Esophageal stricture   . Peripheral vascular disease, unspecified   . Nausea alone   . Diarrhea   . Abnormal involuntary movements(781.0)   . Inflammatory disease of breast   . Transient ischemic attack (TIA), and cerebral infarction without residual deficits(V12.54)   . Urinary tract infection, site not specified   . Benign hypertensive heart disease with congestive heart failure   . Congestive heart failure, unspecified   . Nonspecific (abnormal) findings on radiological and other examination of skull and head   . Unspecified pruritic disorder   . Inflamed seborrheic keratosis   . Seborrheic keratosis   . Other B-complex deficiencies   . Chronic kidney disease, stage III (moderate)   . Type II or unspecified type diabetes mellitus with renal manifestations, not stated  as uncontrolled   . Postmenopausal atrophic vaginitis   . Palpitations   . Mixed incontinence urge and stress (female)(female)   . Contact dermatitis and other eczema, due to unspecified cause   . Unspecified vitamin D deficiency   . Corns and callosities   . Gastroparesis   . Other specified visual disturbances   . Spasm of muscle   . Edema   . Pain in joint, lower leg   . Other and unspecified hyperlipidemia   . Anxiety state, unspecified   . Unspecified hereditary and idiopathic peripheral neuropathy   . Unspecified constipation   . Unspecified essential hypertension   .  Unspecified hypothyroidism   . Reflux esophagitis   . Other functional disorder of bladder   . Osteoarthrosis, unspecified whether generalized or localized, unspecified site   . Cervicalgia   . Spinal stenosis, unspecified region other than cervical   . Lumbago   . Unspecified sleep apnea   . Other malaise and fatigue   . Stricture and stenosis of esophagus    Past Surgical History  Procedure Laterality Date  . Back surgery      x 2  . Abdominal hysterectomy    . Thyroidectomy, partial  1965  . Cholecystectomy  1991  . Hammer toes    . Eye surgery  1994, 1997    bilateral cataract  . Hernia repair  1984aaaaaaaa  . Skin cancer destruction    . Esophageal  2004,2006    dilation, Dr Lyla Son   Social History:   reports that she has never smoked. She has never used smokeless tobacco. She reports that she does not drink alcohol or use illicit drugs.  Family History  Problem Relation Age of Onset  . Cancer Mother     ? stomach  . Heart disease Father   . Hyperlipidemia Father   . Hypertension Father   . Heart attack Father   . Heart disease Brother   . Kidney disease Daughter   . Heart disease Brother   . Cancer Son     Medications: Patient's Medications  New Prescriptions   No medications on file  Previous Medications   ALCOHOL SWABS (ALCOHOL PREP) 70 % PADS       ALPRAZOLAM (XANAX) 0.5 MG TABLET    Take one tablet by mouth up to 3 times daily as needed for anxiety or sleep   ASPIRIN 325 MG TABLET    Take 325 mg by mouth daily.   BLOOD GLUCOSE MONITORING SUPPL (BAYER BREEZE 2 SYSTEM) W/DEVICE KIT       CRANBERRY PO    Take 300 mg by mouth daily.   DESOXIMETASONE (TOPICORT) 0.25 % CREAM    Apply to lesion daily   DIGOXIN (LANOXIN) 0.125 MG TABLET    Take on Monday, Wednesday, Friday and Saturday   DIPHENHYDRAMINE-ZINC ACETATE (BENADRYL ITCH STOPPING) CREAM    Apply 1 application topically 3 (three) times daily as needed.   EASY COMFORT INSULIN SYRINGE 30G X 1/2"  0.5 ML MISC       ERGOCALCIFEROL (VITAMIN D2) 50000 UNITS CAPSULE    Take 1 capsule (50,000 Units total) by mouth once a week.   FLUCONAZOLE (DIFLUCAN) 100 MG TABLET    Take 1 tablet (100 mg total) by mouth every other day.   GLUCOSE BLOOD (BAYER BREEZE 2 TEST VI)    Check blood sugar 3 x daily as directed. DX:250.02   HYDROCHLOROTHIAZIDE (HYDRODIURIL) 25 MG TABLET    Take 1 tablet (25  mg total) by mouth daily.   HYDROCODONE-ACETAMINOPHEN (NORCO/VICODIN) 5-325 MG PER TABLET    Take 1 tablet 4 times a day as needed for pain   INSULIN ASPART (NOVOLOG FLEXPEN) 100 UNIT/ML INJECTION    Inject 10 units before breakfast, 12 units before lunch, and 14 units before supper to control blood sugar. May use up to 3 additional units at each meal as needed   INSULIN GLARGINE (LANTUS SOLOSTAR) 100 UNIT/ML SOPN    Inject 40 Units into the skin daily.   INSULIN PEN NEEDLE 29G X 12.7MM MISC    Use as directed to administer insulin. DX: 250.40   LANCET DEVICES (LANCING DEVICE) MISC       LEVOTHYROXINE (SYNTHROID, LEVOTHROID) 100 MCG TABLET    Take 1 tablet by mouth daily.   OMEPRAZOLE (PRILOSEC) 40 MG CAPSULE    Take one capsule by mouth twice daily   PROMETHAZINE (PHENERGAN) 25 MG TABLET    Take one tablet by mouth every 8 hours as needed for nausea for 3 days. Take with food.   SACCHAROMYCES BOULARDII (FLORASTOR) 250 MG CAPSULE    Take 1 capsule (250 mg total) by mouth 2 (two) times daily.   TIZANIDINE (ZANAFLEX) 4 MG CAPSULE    Take one tablet by mouth three times daily as needed to relax muscles.   TRIMETHOPRIM (TRIMPEX) 100 MG TABLET    Take one tablet by mouth every night at bedtime to prevent bladder infection   VITAMIN E PO    Take 400 Units by mouth daily.  Modified Medications   No medications on file  Discontinued Medications   AMOXICILLIN-CLAVULANATE (AUGMENTIN) 875-125 MG PER TABLET    Take 1 tablet by mouth 2 (two) times daily.   CHLORPHENIRAMINE-HYDROCODONE (TUSSIONEX PENNKINETIC ER) 10-8 MG/5ML  LQCR    Take 5 mLs by mouth 2 (two) times daily.     Physical Exam:  Filed Vitals:   08/29/13 1454  BP: 128/72  Pulse: 75  Resp: 10  Weight: 130 lb (58.968 kg)  SpO2: 96%    Physical Exam  Constitutional:   frail female in NAD  HENT:  Head: Normocephalic and atraumatic.  Right Ear: External ear normal.  Left Ear: External ear normal.  Nose: Nose normal.  Mouth/Throat: Oropharynx is clear and moist. No oropharyngeal exudate.  Eyes: Conjunctivae and EOM are normal. Pupils are equal, round, and reactive to light.  Neck: Normal range of motion. Neck supple. No thyromegaly present.  Cardiovascular: Normal rate, regular rhythm and normal heart sounds.   Pulmonary/Chest: Effort normal and breath sounds normal. No respiratory distress. She has no wheezes.  Abdominal: Soft. Bowel sounds are normal. She exhibits no distension.  Musculoskeletal: Normal range of motion. She exhibits tenderness (to right hand; and left elbow; good ROM ).  Lymphadenopathy:    She has no cervical adenopathy.  Neurological: She is alert. She displays normal reflexes. Coordination normal.  Slow cautious gait  Skin: Skin is warm and dry. She is not diaphoretic.  Psychiatric: Affect normal.     Labs reviewed: Basic Metabolic Panel:  Recent Labs  02/04/13 0903 05/13/13 0858 07/11/13 0917 08/08/13 1409  NA 141 143 144 139  K 4.5 4.9 4.6 5.3*  CL 98 100 99 97  CO2 $Re'29 28 28 25  'YgG$ GLUCOSE 117* 128* 116* 94  BUN $Re'20 22 21 'QkJ$ 30*  CREATININE 1.24* 1.23* 1.28* 1.67*  CALCIUM 9.7 9.3 9.3 9.4  TSH  --  3.980 5.360*  --    Liver Function Tests:  Recent Labs  07/11/13 0917  AST 30  ALT 31  ALKPHOS 92  BILITOT 0.3  PROT 6.7   No results found for this basename: LIPASE, AMYLASE,  in the last 8760 hours No results found for this basename: AMMONIA,  in the last 8760 hours CBC:  Recent Labs  08/08/13 1409  WBC 14.2*  NEUTROABS 9.6*  HGB 11.8  HCT 36.7  MCV 93  PLT 483*   Lipid  Panel:  Recent Labs  02/04/13 0903 05/13/13 0858  HDL 30* 27*  LDLCALC 92 96  TRIG 189* 180*  CHOLHDL 5.3* 5.9*   TSH:  Recent Labs  05/13/13 0858 07/11/13 0917  TSH 3.980 5.360*   A1C: No components found with this basename: A1C,    Assessment/Plan 1. Osteoarthritis of left knee -unchanged  - HYDROcodone-acetaminophen (NORCO/VICODIN) 5-325 MG per tablet; Take 1 tablet 4 times a day as needed for pain  Dispense: 120 tablet; Refill: 0  2. Anxiety -stable - ALPRAZolam (XANAX) 0.5 MG tablet; Take one tablet by mouth up to 3 times daily as needed for anxiety or sleep  Dispense: 90 tablet; Refill: 3  3. Fall - Ambulatory referral to [facility] for strength and gait training; home safety  -also to get life alert or some other safety device to call incase she falls again - DG Hand Complete Right; Future - DG Elbow Complete Left; Future-- r/o fx   4. Debility -weakness after UTI and bronchitis; now with fall  - Ambulatory referral to [facility]-- PT/OT  5. Right hand pain -bruised will get xray rule out fracture  - DG Hand Complete Right; Future  6. Unspecified constipation -improved - lubiprostone (AMITIZA) 24 MCG capsule; Take 1 capsule (24 mcg total) by mouth 2 (two) times daily with a meal.  Dispense: 60 capsule; Refill: 3  7. Anorexia -pt with no appeitite, has nausea at times; will test of H pylori but also will give appetite stimulant -pt also with hx of esophageal stricture but recently had dilatation-- may need to follow up with GI; she does not wish to at this time  - mirtazapine (REMERON) 15 MG tablet; Take 1 tablet (15 mg total) by mouth at bedtime.  Dispense: 30 tablet; Refill: 3  8. Nausea alone -with GERD and bloating, will test for  H. pylori Antibody, IgG - conts on PRN medication  9. Type II or unspecified type diabetes mellitus with renal manifestations, not stated as uncontrolled(250.40) -due to decreased appetitie her blood sugars have been  dropping into the 40s! She has held lantus insulin frequently-- giving novolog and still having low blood sugars -WILL STOP ALL NOVOLOG at this time and decrease lantus to 10 at night -pt to call if blood sugars remain low or very high- otherwise to keep log and bring to next visit - Insulin Glargine (LANTUS SOLOSTAR) 100 UNIT/ML Solostar Pen; Inject 10 Units into the skin at bedtime.  Dispense: 2 pen; Refill: 6  Has follow up scheduled with Nyoka Cowden, MD- keep to this appt but follow up as needed before if needed.

## 2013-08-29 NOTE — Patient Instructions (Signed)
Will start remeron to take at night for appetite stimulation   To go and get Xrays of hand and elbow  Samples provided of Amitiza but you have this at the pharmacy when you need to   Home health PT/OT for strength and gait to prevent future falls

## 2013-08-31 LAB — H. PYLORI ANTIBODY, IGG: H Pylori IgG: <0.9 U/mL (ref 0.0–0.8)

## 2013-09-03 ENCOUNTER — Other Ambulatory Visit: Payer: Self-pay | Admitting: *Deleted

## 2013-09-03 DIAGNOSIS — IMO0002 Reserved for concepts with insufficient information to code with codable children: Secondary | ICD-10-CM

## 2013-09-03 DIAGNOSIS — M6281 Muscle weakness (generalized): Secondary | ICD-10-CM

## 2013-09-03 DIAGNOSIS — M1710 Unilateral primary osteoarthritis, unspecified knee: Secondary | ICD-10-CM

## 2013-09-03 DIAGNOSIS — F411 Generalized anxiety disorder: Secondary | ICD-10-CM

## 2013-09-03 DIAGNOSIS — E119 Type 2 diabetes mellitus without complications: Secondary | ICD-10-CM

## 2013-09-03 MED ORDER — PROMETHAZINE HCL 25 MG PO TABS: ORAL_TABLET | ORAL | Status: DC

## 2013-09-04 ENCOUNTER — Telehealth: Payer: Self-pay | Admitting: *Deleted

## 2013-09-04 NOTE — Telephone Encounter (Signed)
Summer from [facility] called to get a order for a [facility] to come see patient for 3 weeks (1x week then 2 x for 2 weeks). This order was okayed by Dr. Nyoka Cowden  And given to Bethena Roys at the [facility] office.

## 2013-09-09 ENCOUNTER — Encounter: Payer: Self-pay | Admitting: Internal Medicine

## 2013-09-09 ENCOUNTER — Ambulatory Visit (INDEPENDENT_AMBULATORY_CARE_PROVIDER_SITE_OTHER): Payer: Medicare Other | Admitting: Internal Medicine

## 2013-09-09 VITALS — BP 110/52 | HR 80 | Ht 64.0 in | Wt 129.6 lb

## 2013-09-09 DIAGNOSIS — R634 Abnormal weight loss: Secondary | ICD-10-CM

## 2013-09-09 DIAGNOSIS — R630 Anorexia: Secondary | ICD-10-CM

## 2013-09-09 DIAGNOSIS — K219 Gastro-esophageal reflux disease without esophagitis: Secondary | ICD-10-CM

## 2013-09-09 DIAGNOSIS — R110 Nausea: Secondary | ICD-10-CM

## 2013-09-09 DIAGNOSIS — K222 Esophageal obstruction: Secondary | ICD-10-CM

## 2013-09-09 MED ORDER — ONDANSETRON HCL 4 MG PO TABS
4.0000 mg | ORAL_TABLET | ORAL | Status: DC | PRN
Start: 2013-09-09 — End: 2013-10-16

## 2013-09-09 NOTE — Patient Instructions (Signed)
We have sent the following medications to your pharmacy for you to pick up at your convenience: Zofran-Take 1 tablet every 4 hours as needed for nausea  Please consider taking a nutritional supplement such as Ensure or Boost while waiting for your appetite to return.

## 2013-09-09 NOTE — Progress Notes (Signed)
HISTORY OF PRESENT ILLNESS:  Karen [name] is a 80 y.o. female with multiple significant medical problems on multiple medications as listed below. She is sent today by her primary provider regarding nausea, anorexia, and weight loss. The patient was evaluated in this office in 2009 for dysphagia with regurgitation and vomiting and again in February 2014 for GERD with recurrent dysphagia. She underwent upper endoscopy with esophageal dilation 10/08/2012. Seen the following day with similar complaints. Negative chest x-ray. Recent history is that of severe acute bronchitis around Christmas. Subsequent urinary tract infection. Multiple antibiotics. Vaginal yeast. Treat with Diflucan. Throughout her illnesses she has had decreased appetite and intermittent nausea. Some vomiting with her first illness, but none since. No abdominal pain, change in bowel habits, or GI bleeding. Phenergan did not help. Zofran helps, but she is out of the medication. Review of blood work from 08/08/2013 shows mild renal insufficiency. Normal liver tests. CBC with white blood cell count 14.2. Normal hemoglobin 11.8. Review of systems is otherwise negative  REVIEW OF SYSTEMS:  All non-GI ROS negative except for arthritis, fatigue, shortness of breath, ankle edema  Past Medical History  Diagnosis Date  . Anemia   . Stroke   . Irregular heartbeat   . Sleep apnea   . Arthritis     body, neck  . TIA (transient ischemic attack) 2012  . Bladder disorder     over active  . Cancer     skin, removed  . Hiatal hernia   . Benign hypertensive heart disease   . CHF (congestive heart failure)   . Vitamin B12 deficiency   . Gastroparesis   . Hyperlipidemia   . Anxiety   . A-fib   . Spinal stenosis   . GERD (gastroesophageal reflux disease)   . Esophageal stricture   . Peripheral vascular disease, unspecified   . Nausea alone   . Diarrhea   . Abnormal involuntary movements(781.0)   . Inflammatory disease of breast    . Transient ischemic attack (TIA), and cerebral infarction without residual deficits(V12.54)   . Urinary tract infection, site not specified   . Benign hypertensive heart disease with congestive heart failure   . Congestive heart failure, unspecified   . Nonspecific (abnormal) findings on radiological and other examination of skull and head   . Unspecified pruritic disorder   . Inflamed seborrheic keratosis   . Seborrheic keratosis   . Other B-complex deficiencies   . Chronic kidney disease, stage III (moderate)   . Type II or unspecified type diabetes mellitus with renal manifestations, not stated as uncontrolled   . Postmenopausal atrophic vaginitis   . Palpitations   . Mixed incontinence urge and stress (female)(female)   . Contact dermatitis and other eczema, due to unspecified cause   . Unspecified vitamin D deficiency   . Corns and callosities   . Gastroparesis   . Other specified visual disturbances   . Spasm of muscle   . Edema   . Pain in joint, lower leg   . Other and unspecified hyperlipidemia   . Anxiety state, unspecified   . Unspecified hereditary and idiopathic peripheral neuropathy   . Unspecified constipation   . Unspecified essential hypertension   . Unspecified hypothyroidism   . Reflux esophagitis   . Other functional disorder of bladder   . Osteoarthrosis, unspecified whether generalized or localized, unspecified site   . Cervicalgia   . Spinal stenosis, unspecified region other than cervical   . Lumbago   . Unspecified  sleep apnea   . Other malaise and fatigue   . Stricture and stenosis of esophagus     Past Surgical History  Procedure Laterality Date  . Back surgery      x 2  . Abdominal hysterectomy    . Thyroidectomy, partial  1965  . Cholecystectomy  1991  . Hammer toes    . Eye surgery  1994, 1997    bilateral cataract  . Hernia repair  1984aaaaaaaa  . Skin cancer destruction    . Esophageal  2004,2006    dilation, Dr Lyla Son     Social History Karen [name]  reports that she has never smoked. She has never used smokeless tobacco. She reports that she does not drink alcohol or use illicit drugs.  family history includes Cancer in her mother and son; Heart attack in her father; Heart disease in her brother, brother, and father; Hyperlipidemia in her father; Hypertension in her father; Kidney disease in her daughter.  Allergies  Allergen Reactions  . Celebrex [Celecoxib] Rash  . Doxycycline Rash  . Lyrica [Pregabalin] Rash  . Avandia [Rosiglitazone]   . Macrodantin [Nitrofurantoin Macrocrystal]   . Sulfa Antibiotics   . Vioxx [Rofecoxib]        PHYSICAL EXAMINATION: Vital signs: BP 110/52  Pulse 80  Ht 5\' 4"  (1.626 m)  Wt 129 lb 9.6 oz (58.786 kg)  BMI 22.23 kg/m2 General: Elderly, Well-developed, well-nourished, no acute distress HEENT: Sclerae are anicteric, conjunctiva pink. Oral mucosa intact Lungs: Clear Heart: Regular Abdomen: soft, nontender, nondistended, no obvious ascites, no peritoneal signs, normal bowel sounds. No organomegaly. Extremities: Trace edema bilaterally Psychiatric: alert and oriented x3. Cooperative   ASSESSMENT:  #1. Anorexia secondary to multiple sequential infectious diseases and associated therapies. #2. Weight loss secondary to anorexia #3. GERD with a history of dysphagia. Stable #4. Multiple significant medical problems   PLAN:  #1. Continue PPI and reflux precautions #2. Prescribe Zofran 4 mg every 4 hours as needed for nausea #3. Nutritional supplementation with Ensure or boost #4. Expect that she will improve with time given no further setbacks medically. She should return to the care of her primary provider

## 2013-09-23 ENCOUNTER — Other Ambulatory Visit: Payer: Self-pay | Admitting: *Deleted

## 2013-09-23 MED ORDER — DIGOXIN 125 MCG PO TABS: ORAL_TABLET | ORAL | Status: DC

## 2013-09-30 ENCOUNTER — Other Ambulatory Visit: Payer: Self-pay | Admitting: *Deleted

## 2013-09-30 MED ORDER — "INSULIN SYRINGE-NEEDLE U-100 30G X 1/2"" 0.5 ML MISC": Status: DC

## 2013-10-01 ENCOUNTER — Other Ambulatory Visit: Payer: Self-pay | Admitting: *Deleted

## 2013-10-01 DIAGNOSIS — M1712 Unilateral primary osteoarthritis, left knee: Secondary | ICD-10-CM

## 2013-10-01 MED ORDER — HYDROCODONE-ACETAMINOPHEN 5-325 MG PO TABS: ORAL_TABLET | ORAL | Status: DC

## 2013-10-01 NOTE — Telephone Encounter (Signed)
[facility] request

## 2013-10-03 ENCOUNTER — Other Ambulatory Visit: Payer: Self-pay | Admitting: *Deleted

## 2013-10-07 ENCOUNTER — Other Ambulatory Visit: Payer: Self-pay | Admitting: *Deleted

## 2013-10-07 MED ORDER — LEVOTHYROXINE SODIUM 100 MCG PO TABS: ORAL_TABLET | ORAL | Status: DC

## 2013-10-07 NOTE — Telephone Encounter (Signed)
Request from pharmacy

## 2013-10-10 ENCOUNTER — Other Ambulatory Visit: Payer: Self-pay | Admitting: *Deleted

## 2013-10-10 MED ORDER — HYDROCHLOROTHIAZIDE 25 MG PO TABS: 25.0000 mg | ORAL_TABLET | Freq: Every day | ORAL | Status: DC

## 2013-10-14 ENCOUNTER — Other Ambulatory Visit: Payer: Medicare Other

## 2013-10-14 DIAGNOSIS — E1129 Type 2 diabetes mellitus with other diabetic kidney complication: Secondary | ICD-10-CM

## 2013-10-14 DIAGNOSIS — E785 Hyperlipidemia, unspecified: Secondary | ICD-10-CM

## 2013-10-16 ENCOUNTER — Ambulatory Visit (INDEPENDENT_AMBULATORY_CARE_PROVIDER_SITE_OTHER): Payer: Medicare Other | Admitting: Internal Medicine

## 2013-10-16 ENCOUNTER — Encounter: Payer: Self-pay | Admitting: Internal Medicine

## 2013-10-16 VITALS — BP 130/70 | HR 91 | Temp 97.6°F | Wt 132.2 lb

## 2013-10-16 DIAGNOSIS — IMO0002 Reserved for concepts with insufficient information to code with codable children: Secondary | ICD-10-CM

## 2013-10-16 DIAGNOSIS — E1129 Type 2 diabetes mellitus with other diabetic kidney complication: Secondary | ICD-10-CM

## 2013-10-16 DIAGNOSIS — M1712 Unilateral primary osteoarthritis, left knee: Secondary | ICD-10-CM

## 2013-10-16 DIAGNOSIS — R259 Unspecified abnormal involuntary movements: Secondary | ICD-10-CM

## 2013-10-16 DIAGNOSIS — R252 Cramp and spasm: Secondary | ICD-10-CM

## 2013-10-16 DIAGNOSIS — L299 Pruritus, unspecified: Secondary | ICD-10-CM

## 2013-10-16 DIAGNOSIS — R251 Tremor, unspecified: Secondary | ICD-10-CM

## 2013-10-16 DIAGNOSIS — I10 Essential (primary) hypertension: Secondary | ICD-10-CM

## 2013-10-16 DIAGNOSIS — R110 Nausea: Secondary | ICD-10-CM | POA: Insufficient documentation

## 2013-10-16 DIAGNOSIS — M25569 Pain in unspecified knee: Secondary | ICD-10-CM

## 2013-10-16 DIAGNOSIS — M1710 Unilateral primary osteoarthritis, unspecified knee: Secondary | ICD-10-CM

## 2013-10-16 DIAGNOSIS — M4800 Spinal stenosis, site unspecified: Secondary | ICD-10-CM

## 2013-10-16 DIAGNOSIS — E039 Hypothyroidism, unspecified: Secondary | ICD-10-CM

## 2013-10-16 DIAGNOSIS — K219 Gastro-esophageal reflux disease without esophagitis: Secondary | ICD-10-CM

## 2013-10-16 DIAGNOSIS — M25562 Pain in left knee: Secondary | ICD-10-CM | POA: Insufficient documentation

## 2013-10-16 LAB — BASIC METABOLIC PANEL
BUN/Creatinine Ratio: 21 (ref 11–26)
BUN: 23 mg/dL (ref 8–27)
CALCIUM: 9.2 mg/dL (ref 8.7–10.3)
CHLORIDE: 101 mmol/L (ref 97–108)
CO2: 27 mmol/L (ref 18–29)
Creatinine, Ser: 1.11 mg/dL — ABNORMAL HIGH (ref 0.57–1.00)
GFR calc Af Amer: 52 mL/min/{1.73_m2} — ABNORMAL LOW (ref >59)
GFR calc non Af Amer: 45 mL/min/{1.73_m2} — ABNORMAL LOW (ref >59)
Glucose: 68 mg/dL (ref 65–99)
POTASSIUM: 4.5 mmol/L (ref 3.5–5.2)
SODIUM: 143 mmol/L (ref 134–144)

## 2013-10-16 LAB — MICROALBUMIN / CREATININE URINE RATIO
CREATININE UR: 73.5 mg/dL (ref 15.0–278.0)
MICROALB/CREAT RATIO: 8 mg/g creat (ref 0.0–30.0)
MICROALBUM., U, RANDOM: 5.9 ug/mL (ref 0.0–17.0)

## 2013-10-16 LAB — LIPID PANEL
Chol/HDL Ratio: 3.6 ratio units (ref 0.0–4.4)
Cholesterol, Total: 156 mg/dL (ref 100–199)
HDL: 43 mg/dL (ref >39)
LDL Calculated: 93 mg/dL (ref 0–99)
Triglycerides: 99 mg/dL (ref 0–149)
VLDL Cholesterol Cal: 20 mg/dL (ref 5–40)

## 2013-10-16 LAB — HEMOGLOBIN A1C
ESTIMATED AVERAGE GLUCOSE: 146 mg/dL
Hgb A1c MFr Bld: 6.7 % — ABNORMAL HIGH (ref 4.8–5.6)

## 2013-10-16 MED ORDER — ONDANSETRON HCL 4 MG PO TABS: 4.0000 mg | ORAL_TABLET | ORAL | Status: DC | PRN

## 2013-10-16 MED ORDER — TRIAMCINOLONE ACETONIDE 40 MG/ML IJ SUSP
40.0000 mg | Freq: Once | INTRAMUSCULAR | Status: AC
  Administered 2013-10-16: 40 mg via INTRA_ARTICULAR

## 2013-10-16 NOTE — Progress Notes (Signed)
Patient ID: Karen [name], female   DOB: July 10, 1924, 80 y.o.   MRN: [id number]    Location:    [facility]  Place of Service:  OFFICE   Allergies  Allergen Reactions  . Celebrex [Celecoxib] Rash  . Doxycycline Rash  . Lyrica [Pregabalin] Rash  . Avandia [Rosiglitazone]   . Macrodantin [Nitrofurantoin Macrocrystal]   . Sulfa Antibiotics   . Vioxx [Rofecoxib]     Chief Complaint  Patient presents with  . Medical Managment of Chronic Issues    3 month follow-up, discuss labs (copy printed)   . Medication Refill    Renew Zofran     HPI:  Type II or unspecified type diabetes mellitus with renal manifestations, not stated as uncontrolled(250.40); controlled  Unspecified essential hypertension: controlled  Nausea: still present from time to time. Not every day.  Acid reflux: controlled  Cramp in muscle: rare  Hypothyroidism: compensated  Osteoarthritis of left knee: increased pain and swelling. Limping.  Spinal stenosis, unspecified region other than cervical; chronic back pains  Tremor: unchanged.Most consistent with BET  Unspecified pruritic disorder: "all over". No rash.    Medications: Patient's Medications  New Prescriptions   No medications on file  Previous Medications   ALCOHOL SWABS (ALCOHOL PREP) 70 % PADS       ALPRAZOLAM (XANAX) 0.5 MG TABLET    Take one tablet by mouth up to 3 times daily as needed for anxiety or sleep   ASPIRIN 325 MG TABLET    Take 325 mg by mouth daily.   BLOOD GLUCOSE MONITORING SUPPL (BAYER BREEZE 2 SYSTEM) W/DEVICE KIT       CRANBERRY PO    Take 300 mg by mouth daily.   DESOXIMETASONE (TOPICORT) 0.25 % CREAM    Apply to lesion daily   DIGOXIN (LANOXIN) 0.125 MG TABLET    Take one tablet by mouth on Mondays,Wednedsdays,Fridays and Saturdays   DIPHENHYDRAMINE-ZINC ACETATE (BENADRYL ITCH STOPPING) CREAM    Apply 1 application topically 3 (three) times daily as needed.   ERGOCALCIFEROL (VITAMIN D2) 50000 UNITS CAPSULE    Take 1  capsule (50,000 Units total) by mouth once a week.   FLUCONAZOLE (DIFLUCAN) 100 MG TABLET    Take 1 tablet (100 mg total) by mouth every other day.   GLUCOSE BLOOD (BAYER BREEZE 2 TEST VI)    Check blood sugar 3 x daily as directed. DX:250.02   HYDROCHLOROTHIAZIDE (HYDRODIURIL) 25 MG TABLET    Take 1 tablet (25 mg total) by mouth daily.   HYDROCODONE-ACETAMINOPHEN (NORCO/VICODIN) 5-325 MG PER TABLET    Take one tablet by mouth four times daily as needed for pain   INSULIN ASPART (NOVOLOG FLEXPEN) 100 UNIT/ML FLEXPEN    Inject into the skin 3 (three) times daily with meals. 10 units in am, 12 units @ lunch, 14 units @ dinner   INSULIN GLARGINE (LANTUS SOLOSTAR) 100 UNIT/ML SOLOSTAR PEN    Inject 10 Units into the skin at bedtime.   INSULIN PEN NEEDLE 29G X 12.7MM MISC    Use as directed to administer insulin. DX: 250.40   INSULIN SYRINGE-NEEDLE U-100 (EASY COMFORT INSULIN SYRINGE) 30G X 1/2" 0.5 ML MISC    Use as Directed with insulin injections. Dx: 250.00   LANCET DEVICES (LANCING DEVICE) MISC       LEVOTHYROXINE (SYNTHROID, LEVOTHROID) 100 MCG TABLET    Take one tablet by mouth 30 minutes before breakfast for thyroid supplement   OMEPRAZOLE (PRILOSEC) 40 MG CAPSULE    Take one  capsule by mouth twice daily   ONDANSETRON (ZOFRAN) 4 MG TABLET    Take 1 tablet (4 mg total) by mouth every 4 (four) hours as needed for nausea or vomiting.   SACCHAROMYCES BOULARDII (FLORASTOR) 250 MG CAPSULE    Take 1 capsule (250 mg total) by mouth 2 (two) times daily.   TIZANIDINE (ZANAFLEX) 4 MG CAPSULE    Take one tablet by mouth three times daily as needed to relax muscles.   TRIMETHOPRIM (TRIMPEX) 100 MG TABLET    Take one tablet by mouth every night at bedtime to prevent bladder infection   VITAMIN E PO    Take 400 Units by mouth daily.  Modified Medications   Modified Medication Previous Medication   LUBIPROSTONE (AMITIZA) 24 MCG CAPSULE lubiprostone (AMITIZA) 24 MCG capsule      Take 24 mcg by mouth daily with  breakfast.    Take 1 capsule (24 mcg total) by mouth 2 (two) times daily with a meal.  Discontinued Medications   PROMETHAZINE (PHENERGAN) 25 MG TABLET    Take one tablet by mouth every 8 hours as needed for nausea for 3 days. Take with food.     Review of Systems  Constitutional: Positive for fatigue. Negative for fever, chills, activity change, appetite change and unexpected weight change.  HENT: Negative.   Eyes: Negative.   Respiratory: Positive for shortness of breath. Negative for wheezing.   Cardiovascular: Negative for chest pain, palpitations and leg swelling.       Bilateral mild peripheral arterial disease. She has been evaluated by Dr. Ruta Hinds, vascular surgery, on 06/21/12. He felt that she had bilateral mild peripheral arterial disease. Leg discomfort seems to be more related to her chronic back problems. She has mild external iliac artery stenosis, but does not have severe occlusive disease. No surgical intervention is warranted.  Gastrointestinal:       Spastic colon. Intermittent diarrhea. Has more problems with chronic constipation.  Endocrine: Negative for polydipsia, polyphagia and polyuria.       Diabetic. History of thyroid problems. Thyroid has been removed. She is on supplements.  Genitourinary:       Urinary leakage and incontinence. Nocturia. Denies dysuria.  Musculoskeletal:       Complaints of muscular weakness. Chronic backache. Arthritis in other joints. Unsteady gait. Bilateral lower leg discomfort. Left knee pain and swelling.  Skin:       Seborrheic keratosis of the left breast. Generalized itching without rash.  Neurological:       Has difficulty with balance. There are episodes of dizziness. She experiences numbness in her feet. She walks wobbly and unsteady. His numbness and weakness in the left leg.  Hematological: Negative.   Psychiatric/Behavioral:       Chronic anxiety. Sleeps okay.    Filed Vitals:   10/16/13 1150  BP: 130/70  Pulse:  91  Temp: 97.6 F (36.4 C)  TempSrc: Oral  Weight: 132 lb 3.2 oz (59.966 kg)  SpO2: 97%   Physical Exam  Constitutional: She is oriented to person, place, and time.  Frail, thin, elderly female.  HENT:  Head: Normocephalic and atraumatic.  Mouth/Throat: No oropharyngeal exudate.  Eyes: Conjunctivae and EOM are normal. Pupils are equal, round, and reactive to light.  Neck: No JVD present. No tracheal deviation present.  Nuchal rigidity. History thyroidectomy.  Cardiovascular: Normal rate.  Exam reveals no gallop and no friction rub.   No murmur heard. Irregularly irregular rhythm. Diminished pedal pulses  Pulmonary/Chest: No respiratory  distress. She has no wheezes. She has no rales.  Abdominal: Soft. Bowel sounds are normal. She exhibits no distension and no mass. There is no tenderness.  Musculoskeletal: She exhibits no edema.  Stiff neck. Shoulder discomfort on palpation. Unstable gait. Tender processes of the lumbar area. Bilateral lower paraspinal muscular tenderness. Bulbous, tender knees with increased stiffness and crepitus. Left leg worse than the right.  Lymphadenopathy:    She has no cervical adenopathy.  Neurological: She is alert and oriented to person, place, and time. No cranial nerve deficit. Coordination abnormal.  Difficulty with balance and with walking.  Skin: No rash noted. No erythema. No pallor.  Seborrheic keratosis left breast.  Psychiatric: Her behavior is normal. Thought content normal.  Moderate anxiety.     Labs reviewed: Appointment on 10/14/2013  Component Date Value Ref Range Status  . Glucose 10/14/2013 68  65 - 99 mg/dL Final  . BUN 10/14/2013 23  8 - 27 mg/dL Final  . Creatinine, Ser 10/14/2013 1.11* 0.57 - 1.00 mg/dL Final  . GFR calc non Af Amer 10/14/2013 45* >59 mL/min/1.73 Final  . GFR calc Af Amer 10/14/2013 52* >59 mL/min/1.73 Final  . BUN/Creatinine Ratio 10/14/2013 21  11 - 26 Final  . Sodium 10/14/2013 143  134 - 144 mmol/L Final   . Potassium 10/14/2013 4.5  3.5 - 5.2 mmol/L Final  . Chloride 10/14/2013 101  97 - 108 mmol/L Final  . CO2 10/14/2013 27  18 - 29 mmol/L Final  . Calcium 10/14/2013 9.2  8.7 - 10.3 mg/dL Final  . Cholesterol, Total 10/14/2013 156  100 - 199 mg/dL Final  . Triglycerides 10/14/2013 99  0 - 149 mg/dL Final  . HDL 10/14/2013 43  >39 mg/dL Final   Comment: According to ATP-III Guidelines, HDL-C >59 mg/dL is considered a                          negative risk factor for CHD.  Marland Kitchen VLDL Cholesterol Cal 10/14/2013 20  5 - 40 mg/dL Final  . LDL Calculated 10/14/2013 93  0 - 99 mg/dL Final  . Chol/HDL Ratio 10/14/2013 3.6  0.0 - 4.4 ratio units Final   Comment:                                   T. Chol/HDL Ratio                                                                      Men  Women                                                        1/2 Avg.Risk  3.4    3.3  Avg.Risk  5.0    4.4                                                         2X Avg.Risk  9.6    7.1                                                         3X Avg.Risk 23.4   11.0  . Creatinine, Ur 10/14/2013 73.5  15.0 - 278.0 mg/dL Final  . Microalbum.,U,Random 10/14/2013 5.9  0.0 - 17.0 ug/mL Final  . MICROALB/CREAT RATIO 10/14/2013 8.0  0.0 - 30.0 mg/g creat Final  . Hemoglobin A1C 10/14/2013 6.7* 4.8 - 5.6 % Final   Comment:          Increased risk for diabetes: 5.7 - 6.4                                   Diabetes: >6.4                                   Glycemic control for adults with diabetes: <7.0  . Estimated average glucose 10/14/2013 146   Final  Office Visit on 08/29/2013  Component Date Value Ref Range Status  . HM Mammogram 12/22/2011 nl   Final  . HM Dexa Scan 08/04/2003 ?   Final  . H Pylori IgG 08/29/2013 <0.9  0.0 - 0.8 U/mL Final   Comment:                              Negative            <0.9                                                        Indeterminate  0.9 - 1.0                                                       Positive            >1.0  Office Visit on 08/08/2013  Component Date Value Ref Range Status  . Color, UA 08/08/2013 Yellow   Final  . Clarity, UA 08/08/2013 Cloudy   Final  . Glucose, UA 08/08/2013 Neg   Final  . Bilirubin, UA 08/08/2013 Neg   Final  . Ketones, UA 08/08/2013 Neg   Final  . Spec Grav, UA 08/08/2013 1.010   Final  . Blood, UA 08/08/2013 Moderate   Final  . pH, UA 08/08/2013 6.0   Final  . Protein, UA 08/08/2013 Neg   Final  . Urobilinogen, UA  08/08/2013 0.2   Final  . Nitrite, UA 08/08/2013 Neg   Final  . Leukocytes, UA 08/08/2013 moderate (2+)   Final  . Urine Culture, Routine 08/08/2013 Final report*  Final  . Result 1 08/08/2013 Enterococcus species*  Final   Comment: Greater than 100,000 colony forming units per mL                          Note: this isolate is vancomycin-susceptible.                          This information is provided for epidemiologic purposes                          only: vancomycin is not among the antibiotics                          recommended for therapy of urinary tract infections                          caused by Enterococcus.  . ANTIMICROBIAL SUSCEPTIBILITY 08/08/2013 Comment   Final   Comment:     S = Susceptible; I = Intermediate; R = Resistant                                              P = Positive; N = Negative                                      MICS are expressed in micrograms per mL                             Antibiotic                 RSLT#1    RSLT#2    RSLT#3    RSLT#4                          Ciprofloxacin                  R                          Levofloxacin                   R                          Nitrofurantoin                 S                          Penicillin                     S                          Tetracycline  R                          Vancomycin                     S  . WBC 08/08/2013 14.2* 3.4 -  10.8 x10E3/uL Final  . RBC 08/08/2013 3.94  3.77 - 5.28 x10E6/uL Final  . Hemoglobin 08/08/2013 11.8  11.1 - 15.9 g/dL Final  . HCT 08/08/2013 36.7  34.0 - 46.6 % Final  . MCV 08/08/2013 93  79 - 97 fL Final  . MCH 08/08/2013 29.9  26.6 - 33.0 pg Final  . MCHC 08/08/2013 32.2  31.5 - 35.7 g/dL Final  . RDW 08/08/2013 13.0  12.3 - 15.4 % Final  . Platelets 08/08/2013 483* 150 - 379 x10E3/uL Final  . Neutrophils Relative % 08/08/2013 69   Final  . Lymphs 08/08/2013 23   Final  . Monocytes 08/08/2013 7   Final  . Eos 08/08/2013 1   Final  . Basos 08/08/2013 0   Final  . Neutrophils Absolute 08/08/2013 9.6* 1.4 - 7.0 x10E3/uL Final  . Lymphocytes Absolute 08/08/2013 3.3* 0.7 - 3.1 x10E3/uL Final  . Monocytes Absolute 08/08/2013 1.1* 0.1 - 0.9 x10E3/uL Final  . Eosinophils Absolute 08/08/2013 0.2  0.0 - 0.4 x10E3/uL Final  . Basophils Absolute 08/08/2013 0.0  0.0 - 0.2 x10E3/uL Final  . Immature Granulocytes 08/08/2013 0   Final  . Immature Grans (Abs) 08/08/2013 0.0  0.0 - 0.1 x10E3/uL Final  . Hematology Comments: 08/08/2013 Note:   Final   Verified by microscopic examination.  . Glucose 08/08/2013 94  65 - 99 mg/dL Final  . BUN 08/08/2013 30* 8 - 27 mg/dL Final  . Creatinine, Ser 08/08/2013 1.67* 0.57 - 1.00 mg/dL Final  . GFR calc non Af Amer 08/08/2013 27* >59 mL/min/1.73 Final  . GFR calc Af Amer 08/08/2013 31* >59 mL/min/1.73 Final  . BUN/Creatinine Ratio 08/08/2013 18  11 - 26 Final  . Sodium 08/08/2013 139  134 - 144 mmol/L Final  . Potassium 08/08/2013 5.3* 3.5 - 5.2 mmol/L Final  . Chloride 08/08/2013 97  97 - 108 mmol/L Final  . CO2 08/08/2013 25  18 - 29 mmol/L Final  . Calcium 08/08/2013 9.4  8.6 - 10.2 mg/dL Final   Comment: **Effective August 19, 2013 the reference interval**                            for Calcium, Serum will be changing to:                                       Age                Female          Female                                    0 - 10 days         8.6 - 10.4     8.6 - 10.4                              11 days -  1 year        9.2 - 11.0     9.2 - 11.0                                    2 - 11 years       9.1 - 10.5     9.1 - 10.5                                   12 - 17 years       8.9 - 10.4     8.9 - 10.4                                   18 - 59 years       8.7 - 10.2     8.7 - 10.2                                       >59 years       8.6 - 10.2     8.7 - 10.3      Assessment/Plan  1. Type II or unspecified type diabetes mellitus with renal manifestations, not stated as uncontrolled(250.40) controlled - Hemoglobin A1c; Future - Comprehensive metabolic panel; Future  2. Unspecified essential hypertension controlled - Comprehensive metabolic panel; Future  3. Nausea - ondansetron (ZOFRAN) 4 MG tablet; Take 1 tablet (4 mg total) by mouth every 4 (four) hours as needed for nausea or vomiting.  Dispense: 30 tablet; Refill: 2  4. Acid reflux Continue omeprazole and probiotic  5. Cramp in muscle improved  6. Hypothyroidism - TSH; Future  7. Osteoarthritis of left knee Procedure: injected 1 cc Kenalog and 2 cc lidocaine. Immediate relief of pain and improvement in gait stability  8. Spinal stenosis, unspecified region other than cervical observe  9. Tremor Consider medicating at future visit. She does not want to take more medication at this time.  10. Unspecified pruritic disorder Uncertain etiology. i suspect dry skin and anxiety  11. Pain, joint, knee, left - triamcinolone acetonide (KENALOG-40) injection 40 mg; Inject 1 mL (40 mg total) into the articular space once along with 2cc lidocaine

## 2013-10-16 NOTE — Patient Instructions (Signed)
Continue current medications. 

## 2013-10-30 LAB — SPECIMEN STATUS REPORT

## 2013-11-04 ENCOUNTER — Other Ambulatory Visit: Payer: Self-pay | Admitting: *Deleted

## 2013-11-04 DIAGNOSIS — M1712 Unilateral primary osteoarthritis, left knee: Secondary | ICD-10-CM

## 2013-11-04 MED ORDER — HYDROCODONE-ACETAMINOPHEN 5-325 MG PO TABS: ORAL_TABLET | ORAL | Status: DC

## 2013-11-04 NOTE — Telephone Encounter (Signed)
[place]

## 2013-11-06 ENCOUNTER — Ambulatory Visit (INDEPENDENT_AMBULATORY_CARE_PROVIDER_SITE_OTHER): Payer: Medicare Other

## 2013-11-06 ENCOUNTER — Other Ambulatory Visit: Payer: Self-pay | Admitting: *Deleted

## 2013-11-06 VITALS — BP 148/69 | HR 84 | Resp 12

## 2013-11-06 DIAGNOSIS — R52 Pain, unspecified: Secondary | ICD-10-CM

## 2013-11-06 DIAGNOSIS — M1990 Unspecified osteoarthritis, unspecified site: Secondary | ICD-10-CM

## 2013-11-06 DIAGNOSIS — M1712 Unilateral primary osteoarthritis, left knee: Secondary | ICD-10-CM

## 2013-11-06 DIAGNOSIS — S9030XA Contusion of unspecified foot, initial encounter: Secondary | ICD-10-CM

## 2013-11-06 DIAGNOSIS — S92309A Fracture of unspecified metatarsal bone(s), unspecified foot, initial encounter for closed fracture: Secondary | ICD-10-CM

## 2013-11-06 DIAGNOSIS — R609 Edema, unspecified: Secondary | ICD-10-CM

## 2013-11-06 DIAGNOSIS — M7750 Other enthesopathy of unspecified foot: Secondary | ICD-10-CM

## 2013-11-06 DIAGNOSIS — M7670 Peroneal tendinitis, unspecified leg: Secondary | ICD-10-CM

## 2013-11-06 MED ORDER — HYDROCODONE-ACETAMINOPHEN 5-325 MG PO TABS: ORAL_TABLET | ORAL | Status: DC

## 2013-11-06 NOTE — Patient Instructions (Signed)
ICE INSTRUCTIONS  Apply ice or cold pack to the affected area at least 3 times a day for 10-15 minutes each time.  You should also use ice after prolonged activity or vigorous exercise.  Do not apply ice longer than 20 minutes at one time.  Always keep a cloth between your skin and the ice pack to prevent burns.  Being consistent and following these instructions will help control your symptoms.  We suggest you purchase a gel ice pack because they are reusable and do bit leak.  Some of them are designed to wrap around the area.  Use the method that works best for you.  Here are some other suggestions for icing.   Use a frozen bag of peas or corn-inexpensive and molds well to your body, usually stays frozen for 10 to 20 minutes.  Wet a towel with cold water and squeeze out the excess until it's damp.  Place in a bag in the freezer for 20 minutes. Then remove and use.  Use ice daily in small intervals 15 minutes on [id number] by [employer] and 15 minutes at a time.    Maintain the air fracture boot at all times may remove the boot for driving only however Rt. replaced with free standing walking or other activities that are necessary

## 2013-11-06 NOTE — Progress Notes (Signed)
   Subjective:    Patient ID: Karen [name], female    DOB: Feb 20, 1924, 80 y.o.   MRN: [id number]  HPI PT STATED INJURED RT FOOT 3 DAYS AGO STILL SORE AND SWOLLEN. THE FOOT IS GETTING WORSE. THE FOOT GET AGGRAVATED WHEN WALKING OR PUTTING PRESSURE ON IT. TRIED TO USED ICE AND FEELS A LITTLE BETTER.     Review of Systems  Musculoskeletal: Positive for gait problem.  All other systems reviewed and are negative.      Objective:   Physical Exam 80 year old white female presents this time history of injury, turgor right foot indicates a screen door hit across the top and outside of the foot with the fifth metatarsal base area and the lateral ankle area lateral malleolar area x-rays of a few of the foot and ankle revealed significant osteoporosis osteopenic changes there is a stone process fracture the base of the fifth metatarsal and lateral oblique projections of the foot there is no fracture of the ankle although significant osteoporosis identified asymmetric joint space narrowing at Lisfranc's mid tarsus joint and subtalar and ankle joints are noted on all views. Again significant osteopenia is noted arthropathy at the MTP joints and Lisfranc joint or projection reveals some tarsal spurring dorsally otherwise mild promontory changes are also noted as well. Vascular status as follows pedal pulses thready at best DP postal for PT plus one over 4 bilateral there is +1 edema right ankle lateral malleolar area neurologically epicritic and proprioceptive sensations diminished on Semmes Weinstein intact and symmetric bilateral there reason diminished testing sensation Semmes Weinstein testing to forefoot digits bilateral. Orthopedic biomechanical exam rectus foot type mild digital contractures noted there is significant osteopenic changes in the sesamoids or so across the fracture the base of the proximal the base of the fifth metatarsal noted on AP and oblique views. Ankle mortise otherwise  unremarkable no fracture the lateral malleoli are area of the tenderness and swelling around the lateral malleolus is noted clinically. May be associated with posse peroneal tendon injury and tendinitis.       Assessment & Plan:  Assessment this time is contusion of the foot does have history diabetes neuropathy and complications osteoporosis or osteopenic changes of the foot evidence for x-rays reveal a possible styloid process fracture the fifth metatarsal base with some proximal displacement. There is edema and no symptoms consistent with fracture or displacement at this time patient placed in air fracture boot immobilization for 4-6 week duration we'll recheck in 4 weeks for followup patient RE taking hydrocodone as needed for pain she has a continuing prescription is available to her on no new medications are recommended otherwise did recommend ice to the foot and ankle area 2 or 3 times daily with repeated applications as needed  Followup in 4 weeks for followup x-ray  Harriet Masson DPM

## 2013-11-07 ENCOUNTER — Telehealth: Payer: Self-pay | Admitting: *Deleted

## 2013-11-07 LAB — TSH: TSH: 1.27 u[IU]/mL (ref 0.450–4.500)

## 2013-11-07 LAB — SPECIMEN STATUS REPORT

## 2013-11-07 NOTE — Telephone Encounter (Signed)
Arrange for home health care assistance with [facility] home health patient is currently ambulatory with air fracture walker boot utilizing a walker or cane for stability however is restricted from driving can only be applied for 10 minutes per hour for necessary activity is going to the bathroom or getting something to eat  she will be in the fracture boot for 4-6 weeks. May need assistance 2 or 3 times a week with bathing and other necessities of daily living  Harriet Masson DPM

## 2013-11-07 NOTE — Telephone Encounter (Signed)
My mother saw him yesterday.  She has a broke foot.  Can he arrange for her to get home health care?  She lives alone.  I'm not able to take care of her due to some health issues.

## 2013-11-08 ENCOUNTER — Telehealth: Payer: Self-pay | Admitting: *Deleted

## 2013-11-08 NOTE — Telephone Encounter (Signed)
Referral for [facility] Ph #: [phone] Fax #:  [phone]

## 2013-11-08 NOTE — Telephone Encounter (Signed)
I returned her call and informed her we sent in an order for Karen [name].  Hopefully they'll hear something soon.

## 2013-11-11 ENCOUNTER — Telehealth: Payer: Self-pay | Admitting: *Deleted

## 2013-11-11 ENCOUNTER — Ambulatory Visit: Payer: Medicare Other

## 2013-11-11 NOTE — Telephone Encounter (Signed)
Order for home health care was not for a skilled need.  Nurses do not do this.  We'll have to decline this order.  If you have any questions please call.

## 2013-11-15 ENCOUNTER — Other Ambulatory Visit: Payer: Self-pay | Admitting: *Deleted

## 2013-11-15 MED ORDER — TRIMETHOPRIM 100 MG PO TABS: ORAL_TABLET | ORAL | Status: DC

## 2013-11-15 NOTE — Telephone Encounter (Signed)
[place]

## 2013-11-19 ENCOUNTER — Telehealth: Payer: Self-pay | Admitting: *Deleted

## 2013-11-19 NOTE — Telephone Encounter (Signed)
I called and informed her that 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  She stated okay.

## 2013-11-25 ENCOUNTER — Other Ambulatory Visit: Payer: Self-pay | Admitting: *Deleted

## 2013-11-25 MED ORDER — INSULIN PEN NEEDLE 29G X 12.7MM MISC: Status: DC

## 2013-11-25 NOTE — Telephone Encounter (Signed)
[place]

## 2013-12-03 ENCOUNTER — Other Ambulatory Visit: Payer: Self-pay | Admitting: *Deleted

## 2013-12-03 DIAGNOSIS — M1712 Unilateral primary osteoarthritis, left knee: Secondary | ICD-10-CM

## 2013-12-03 MED ORDER — HYDROCODONE-ACETAMINOPHEN 5-325 MG PO TABS: ORAL_TABLET | ORAL | Status: DC

## 2013-12-03 NOTE — Telephone Encounter (Signed)
[place]

## 2013-12-04 ENCOUNTER — Ambulatory Visit (INDEPENDENT_AMBULATORY_CARE_PROVIDER_SITE_OTHER): Payer: Medicare Other

## 2013-12-04 VITALS — BP 152/60 | HR 80 | Resp 12

## 2013-12-04 DIAGNOSIS — R609 Edema, unspecified: Secondary | ICD-10-CM

## 2013-12-04 DIAGNOSIS — M1990 Unspecified osteoarthritis, unspecified site: Secondary | ICD-10-CM

## 2013-12-04 DIAGNOSIS — S92309A Fracture of unspecified metatarsal bone(s), unspecified foot, initial encounter for closed fracture: Secondary | ICD-10-CM

## 2013-12-04 DIAGNOSIS — S9030XA Contusion of unspecified foot, initial encounter: Secondary | ICD-10-CM

## 2013-12-04 NOTE — Patient Instructions (Addendum)
ICE INSTRUCTIONS  Apply ice or cold pack to the affected area at least 3 times a day for 10-15 minutes each time.  You should also use ice after prolonged activity or vigorous exercise.  Do not apply ice longer than 20 minutes at one time.  Always keep a cloth between your skin and the ice pack to prevent burns.  Being consistent and following these instructions will help control your symptoms.  We suggest you purchase a gel ice pack because they are reusable and do bit leak.  Some of them are designed to wrap around the area.  Use the method that works best for you.  Here are some other suggestions for icing.   Use a frozen bag of peas or corn-inexpensive and molds well to your body, usually stays frozen for 10 to 20 minutes.  Wet a towel with cold water and squeeze out the excess until it's damp.  Place in a bag in the freezer for 20 minutes. Then remove and use.  May discontinue the air fracture boot starting this weekend and resume wearing a stiff soled walking or athletic shoe or casual shoe. No barefoot or flimsy shoes or flip-flops.  As far as the contusion of the left great toe apply a cold compress or ice pack 3 times a day to help reduce the bruising or ecchymosis followup or contact us with any changes or exacerbations or worsening of symptoms

## 2013-12-04 NOTE — Progress Notes (Signed)
   Subjective:    Patient ID: Karen [name], female    DOB: [birth year], 80 y.o.   MRN: [id number]  HPI ''RT FOOT IS DOING MUCH BETTER.''   NEW PROBLEM:  PT STATED LT FOOT GREAT TOE IS BEEN SORE AND HAVE LITTLE REDNESS FOR ABOUT 1 WEEK. THE TOE IS BEEN THE SAME NOT GETTING WORSE. THE TOE GET AGGRAVATED BY PRESSURE AND TRIED TO PUT CORTISONE BUT IT HELP SOME.   Review of Systems no systemic changes or findings noted     Objective:   Physical Exam Lower extremity objective findings as follows vascular status appears to be intact the right foot lateral fifth metatarsal area is doing much better still some slight tenderness patient isn't having in her anterior shin area keep the boot her air fracture boot for bruising portion of the edema is reduced there is no revision pain on palpation fifth metatarsal base fifth metatarsal styloid process fracture is improved improvement or healing ankle mortise otherwise doing well x-rays otherwise unremarkable on the right foot left foot patient complaining of pain on the proximal nail fold of the left great toe the hallux show some proptosis incurvation yellowing and discoloration consistent with onychomycosis however there may been a contusion to the dorsum of toe possibly caused by the wearing the boot on the contralateral leg. There is no discharge drainage no open wounds ulcerations no breaking the skin or ingrown nail noted possible contusion to the nail bed and proximal nail fold       Assessment & Plan:  Assessment this time is improved of fracture or fifth metatarsal base right foot discharge to an as-needed basis for followup discontinue air fracture boot after this weekend maintain a stiff soled shoe.  The problem is a contusion to left great toe proximal nail fold open wounds ulcerations recommended ice to the area in some tube foam padding dispensed to cushion the toe reappointed in one month or an as-needed basis for future  followup  Harriet Masson DPM

## 2014-01-02 ENCOUNTER — Other Ambulatory Visit: Payer: Self-pay | Admitting: *Deleted

## 2014-01-02 DIAGNOSIS — F419 Anxiety disorder, unspecified: Secondary | ICD-10-CM

## 2014-01-02 MED ORDER — ALPRAZOLAM 0.5 MG PO TABS: ORAL_TABLET | ORAL | Status: DC

## 2014-01-02 NOTE — Telephone Encounter (Signed)
[place]

## 2014-01-09 ENCOUNTER — Telehealth: Payer: Self-pay | Admitting: Internal Medicine

## 2014-01-09 ENCOUNTER — Other Ambulatory Visit: Payer: Medicare Other

## 2014-01-09 DIAGNOSIS — E1129 Type 2 diabetes mellitus with other diabetic kidney complication: Secondary | ICD-10-CM

## 2014-01-09 DIAGNOSIS — E039 Hypothyroidism, unspecified: Secondary | ICD-10-CM

## 2014-01-09 DIAGNOSIS — I10 Essential (primary) hypertension: Secondary | ICD-10-CM

## 2014-01-09 NOTE — Telephone Encounter (Signed)
Patient Assistance Form received by pt 01-09-2014-  Confirmed patient inform completed- forwarded to Clinic Staff  For review.  cdavis

## 2014-01-10 ENCOUNTER — Other Ambulatory Visit: Payer: Self-pay

## 2014-01-10 DIAGNOSIS — M1712 Unilateral primary osteoarthritis, left knee: Secondary | ICD-10-CM

## 2014-01-10 LAB — COMPREHENSIVE METABOLIC PANEL
A/G RATIO: 1.9 (ref 1.1–2.5)
ALBUMIN: 4.2 g/dL (ref 3.5–4.7)
ALT: 24 IU/L (ref 0–32)
AST: 23 IU/L (ref 0–40)
Alkaline Phosphatase: 78 IU/L (ref 39–117)
BUN/Creatinine Ratio: 16 (ref 11–26)
BUN: 20 mg/dL (ref 8–27)
CALCIUM: 9 mg/dL (ref 8.7–10.3)
CO2: 28 mmol/L (ref 18–29)
Chloride: 98 mmol/L (ref 97–108)
Creatinine, Ser: 1.29 mg/dL — ABNORMAL HIGH (ref 0.57–1.00)
GFR calc Af Amer: 43 mL/min/{1.73_m2} — ABNORMAL LOW (ref >59)
GFR calc non Af Amer: 37 mL/min/{1.73_m2} — ABNORMAL LOW (ref >59)
Globulin, Total: 2.2 g/dL (ref 1.5–4.5)
Glucose: 125 mg/dL — ABNORMAL HIGH (ref 65–99)
Potassium: 4.9 mmol/L (ref 3.5–5.2)
Sodium: 140 mmol/L (ref 134–144)
TOTAL PROTEIN: 6.4 g/dL (ref 6.0–8.5)
Total Bilirubin: 0.4 mg/dL (ref 0.0–1.2)

## 2014-01-10 LAB — TSH: TSH: 3.37 u[IU]/mL (ref 0.450–4.500)

## 2014-01-10 LAB — HEMOGLOBIN A1C
ESTIMATED AVERAGE GLUCOSE: 163 mg/dL
HEMOGLOBIN A1C: 7.3 % — AB (ref 4.8–5.6)

## 2014-01-10 MED ORDER — HYDROCODONE-ACETAMINOPHEN 5-325 MG PO TABS: ORAL_TABLET | ORAL | Status: DC

## 2014-01-14 ENCOUNTER — Other Ambulatory Visit: Payer: Self-pay | Admitting: *Deleted

## 2014-01-14 DIAGNOSIS — E1129 Type 2 diabetes mellitus with other diabetic kidney complication: Secondary | ICD-10-CM

## 2014-01-14 DIAGNOSIS — F419 Anxiety disorder, unspecified: Secondary | ICD-10-CM

## 2014-01-14 MED ORDER — INSULIN GLARGINE 100 UNIT/ML SOLOSTAR PEN: 10.0000 [IU] | PEN_INJECTOR | Freq: Every day | SUBCUTANEOUS | Status: DC

## 2014-01-14 MED ORDER — LEVOTHYROXINE SODIUM 100 MCG PO TABS: ORAL_TABLET | ORAL | Status: DC

## 2014-01-14 MED ORDER — ALPRAZOLAM 0.5 MG PO TABS: ORAL_TABLET | ORAL | Status: DC

## 2014-01-14 NOTE — Telephone Encounter (Signed)
Patient brought paperwork for Dr. Nyoka Cowden to fill out for [employer] Patient Assistance Program. I filled out the portion I could and printed Rx's for Dr. Nyoka Cowden to sign.  Paperwork to be faxed to [employer] Fax # [phone] Patient Notified that paperwork was received and will be signed and faxed back. Also Explained the charge for filling out forms in the future, waived this time per [name]. Patient agreed.

## 2014-01-15 ENCOUNTER — Encounter: Payer: Self-pay | Admitting: Internal Medicine

## 2014-01-15 ENCOUNTER — Ambulatory Visit (INDEPENDENT_AMBULATORY_CARE_PROVIDER_SITE_OTHER): Payer: Medicare Other | Admitting: Internal Medicine

## 2014-01-15 VITALS — BP 122/70 | HR 83 | Temp 98.0°F | Resp 10 | Wt 137.0 lb

## 2014-01-15 DIAGNOSIS — E039 Hypothyroidism, unspecified: Secondary | ICD-10-CM

## 2014-01-15 DIAGNOSIS — I10 Essential (primary) hypertension: Secondary | ICD-10-CM

## 2014-01-15 DIAGNOSIS — R251 Tremor, unspecified: Secondary | ICD-10-CM

## 2014-01-15 DIAGNOSIS — R259 Unspecified abnormal involuntary movements: Secondary | ICD-10-CM

## 2014-01-15 DIAGNOSIS — E1129 Type 2 diabetes mellitus with other diabetic kidney complication: Secondary | ICD-10-CM

## 2014-01-15 DIAGNOSIS — N183 Chronic kidney disease, stage 3 unspecified: Secondary | ICD-10-CM

## 2014-01-15 DIAGNOSIS — Z79899 Other long term (current) drug therapy: Secondary | ICD-10-CM

## 2014-01-15 DIAGNOSIS — I739 Peripheral vascular disease, unspecified: Secondary | ICD-10-CM

## 2014-01-15 DIAGNOSIS — R110 Nausea: Secondary | ICD-10-CM

## 2014-01-15 MED ORDER — INSULIN GLARGINE 100 UNIT/ML SOLOSTAR PEN: PEN_INJECTOR | SUBCUTANEOUS | Status: DC

## 2014-01-15 NOTE — Patient Instructions (Addendum)
Continue current medications. 

## 2014-01-15 NOTE — Telephone Encounter (Signed)
Paperwork completed by Dr. Nyoka Cowden. Faxed signed forms and Rx's to [employer] for Xanax and Levothyroxine Fax #: [phone]  #: [phone]. Paperwork sent to be scanned into EPIC  Paperwork completed by Dr. Nyoka Cowden. Faxed signed forms and Rx to [facility] for Lantus Insulin solostar pen Fax #: [phone]  #: [phone]. Paperwork sent to be scanned into EPIC  Patient was Notified

## 2014-01-15 NOTE — Progress Notes (Signed)
Patient ID: Karen [name], female   DOB: [birth year], 80 y.o.   MRN: [id number]    Location:    [facility]  Place of Service:  OFFICE    Allergies  Allergen Reactions  . Celebrex [Celecoxib] Rash  . Doxycycline Rash  . Lyrica [Pregabalin] Rash  . Avandia [Rosiglitazone]   . Macrodantin [Nitrofurantoin Macrocrystal]   . Sulfa Antibiotics   . Vioxx [Rofecoxib]     Chief Complaint  Patient presents with  . Medical Management of Chronic Issues    3 month follow-up, discuss labs ( copy printed )  . 6 CIT DEMENTIA TEST    Scored 2 (Normal)   . Raised Nodule    Examine right side of neck/collar bone area, patient with raised area of concern     HPI:  Type II or unspecified type diabetes mellitus with renal manifestations, not stated as uncontrolled(250.40) - has been running a liitle high. Stressed.  Unspecified essential hypertension  Peripheral vascular disease, unspecified: stable. Diminished  Pedal pulses.  Hypothyroidism - compensated  Chronic kidney disease, stage III (moderate) - stable  Nausea; occasional  Tremor: unchanged  Notes an enlargement of the medial end of the calvicle. Had a fall and injured it. It is not currently tender.  Medications: Patient's Medications  New Prescriptions   No medications on file  Previous Medications   ALCOHOL SWABS (ALCOHOL PREP) 70 % PADS       ALPRAZOLAM (XANAX) 0.5 MG TABLET    Take one tablet by mouth up to 3 times daily as needed for anxiety or sleep   ASPIRIN 325 MG TABLET    Take 325 mg by mouth daily.   BLOOD GLUCOSE MONITORING SUPPL (BAYER BREEZE 2 SYSTEM) W/DEVICE KIT       CRANBERRY PO    Take 300 mg by mouth daily.   DESOXIMETASONE (TOPICORT) 0.25 % CREAM    Apply to lesion daily   DIGOXIN (LANOXIN) 0.125 MG TABLET    Take one tablet by mouth on Mondays,Wednedsdays,Fridays and Saturdays   DIPHENHYDRAMINE-ZINC ACETATE (BENADRYL ITCH STOPPING) CREAM    Apply 1 application topically 3 (three) times daily as needed.     ERGOCALCIFEROL (VITAMIN D2) 50000 UNITS CAPSULE    Take 1 capsule (50,000 Units total) by mouth once a week.   GLUCOSE BLOOD (BAYER BREEZE 2 TEST VI)    Check blood sugar 3 x daily as directed. DX:250.02   HYDROCHLOROTHIAZIDE (HYDRODIURIL) 25 MG TABLET    Take 1 tablet (25 mg total) by mouth daily.   HYDROCODONE-ACETAMINOPHEN (NORCO/VICODIN) 5-325 MG PER TABLET    Take one tablet by mouth four times daily as needed for pain   INSULIN ASPART (NOVOLOG FLEXPEN) 100 UNIT/ML FLEXPEN    Inject into the skin 3 (three) times daily with meals. 10 units in am, 12 units @ lunch, 14 units @ dinner   INSULIN GLARGINE (LANTUS SOLOSTAR) 100 UNIT/ML SOLOSTAR PEN    Inject 10 Units into the skin at bedtime.   INSULIN PEN NEEDLE 29G X 12.7MM MISC    Use as directed to administer insulin. DX: 250.40   INSULIN SYRINGE-NEEDLE U-100 (EASY COMFORT INSULIN SYRINGE) 30G X 1/2" 0.5 ML MISC    Use as Directed with insulin injections. Dx: 250.00   LANCET DEVICES (LANCING DEVICE) MISC       LEVOTHYROXINE (SYNTHROID, LEVOTHROID) 100 MCG TABLET    Take one tablet by mouth 30 minutes before breakfast for thyroid supplement   OMEPRAZOLE (PRILOSEC) 40 MG CAPSULE  Take one capsule by mouth twice daily   ONDANSETRON (ZOFRAN) 4 MG TABLET    Take 1 tablet (4 mg total) by mouth every 4 (four) hours as needed for nausea or vomiting.   SACCHAROMYCES BOULARDII (FLORASTOR) 250 MG CAPSULE    Take 1 capsule (250 mg total) by mouth 2 (two) times daily.   TIZANIDINE (ZANAFLEX) 4 MG CAPSULE    Take one tablet by mouth three times daily as needed to relax muscles.   TRIMETHOPRIM (TRIMPEX) 100 MG TABLET    Take one tablet by mouth every night at bedtime to prevent bladder infection   VITAMIN E PO    Take 400 Units by mouth daily.  Modified Medications   No medications on file  Discontinued Medications   FLUCONAZOLE (DIFLUCAN) 100 MG TABLET    Take 1 tablet (100 mg total) by mouth every other day.   LUBIPROSTONE (AMITIZA) 24 MCG CAPSULE     Take 24 mcg by mouth daily with breakfast.     Review of Systems  Constitutional: Positive for fatigue. Negative for fever, chills, activity change, appetite change and unexpected weight change.  HENT: Negative.   Eyes: Negative.   Respiratory: Positive for shortness of breath. Negative for wheezing.   Cardiovascular: Negative for chest pain, palpitations and leg swelling.       Bilateral mild peripheral arterial disease. She has been evaluated by Dr. Ruta Hinds, vascular surgery, on 06/21/12. He felt that she had bilateral mild peripheral arterial disease. Leg discomfort seems to be more related to her chronic back problems. She has mild external iliac artery stenosis, but does not have severe occlusive disease. No surgical intervention is warranted.  Gastrointestinal:       Spastic colon. Intermittent diarrhea. Has more problems with chronic constipation.  Endocrine: Negative for polydipsia, polyphagia and polyuria.       Diabetic. History of thyroid problems. Thyroid has been removed. She is on supplements.  Genitourinary:       Urinary leakage and incontinence. Nocturia. Denies dysuria.  Musculoskeletal:       Complaints of muscular weakness. Chronic backache. Arthritis in other joints. Unsteady gait. Bilateral lower leg discomfort. Left knee pain and swelling. Enlarged end of the right clavicle at the manubrium.  Skin:       Seborrheic keratosis of the left breast. Generalized itching without rash.  Neurological:       Has difficulty with balance. There are episodes of dizziness. She experiences numbness in her feet. She walks wobbly and unsteady. His numbness and weakness in the left leg.  Hematological: Negative.   Psychiatric/Behavioral:       Chronic anxiety. Sleeps okay.    Filed Vitals:   01/15/14 1227  BP: 122/70  Pulse: 83  Temp: 98 F (36.7 C)  TempSrc: Oral  Resp: 10  Weight: 137 lb (62.143 kg)  SpO2: 97%   Body mass index is 23.5 kg/(m^2).  Physical  Exam  Constitutional: She is oriented to person, place, and time.  Frail, thin, elderly female.  HENT:  Head: Normocephalic and atraumatic.  Mouth/Throat: No oropharyngeal exudate.  Eyes: Conjunctivae and EOM are normal. Pupils are equal, round, and reactive to light.  Neck: No JVD present. No tracheal deviation present.  Nuchal rigidity. History thyroidectomy.  Cardiovascular: Normal rate.  Exam reveals no gallop and no friction rub.   No murmur heard. Irregularly irregular rhythm. Diminished pedal pulses  Pulmonary/Chest: No respiratory distress. She has no wheezes. She has no rales.  Abdominal: Soft. Bowel  sounds are normal. She exhibits no distension and no mass. There is no tenderness.  Musculoskeletal: She exhibits no edema.  Stiff neck. Shoulder discomfort on palpation. Unstable gait. Tender processes of the lumbar area. Bilateral lower paraspinal muscular tenderness. Bulbous, tender knees with increased stiffness and crepitus. Left leg worse than the right.  Lymphadenopathy:    She has no cervical adenopathy.  Neurological: She is alert and oriented to person, place, and time. No cranial nerve deficit. Coordination abnormal.  Difficulty with balance and with walking.  Skin: No rash noted. No erythema. No pallor.  Seborrheic keratosis left breast.  Psychiatric: Her behavior is normal. Thought content normal.  Moderate anxiety.     Labs reviewed: Appointment on 01/09/2014  Component Date Value Ref Range Status  . Hemoglobin A1C 01/09/2014 7.3* 4.8 - 5.6 % Final   Comment:          Increased risk for diabetes: 5.7 - 6.4                                   Diabetes: >6.4                                   Glycemic control for adults with diabetes: <7.0  . Estimated average glucose 01/09/2014 163   Final  . Glucose 01/09/2014 125* 65 - 99 mg/dL Final  . BUN 01/09/2014 20  8 - 27 mg/dL Final  . Creatinine, Ser 01/09/2014 1.29* 0.57 - 1.00 mg/dL Final  . GFR calc non Af Amer  01/09/2014 37* >59 mL/min/1.73 Final  . GFR calc Af Amer 01/09/2014 43* >59 mL/min/1.73 Final  . BUN/Creatinine Ratio 01/09/2014 16  11 - 26 Final  . Sodium 01/09/2014 140  134 - 144 mmol/L Final  . Potassium 01/09/2014 4.9  3.5 - 5.2 mmol/L Final  . Chloride 01/09/2014 98  97 - 108 mmol/L Final  . CO2 01/09/2014 28  18 - 29 mmol/L Final  . Calcium 01/09/2014 9.0  8.7 - 10.3 mg/dL Final  . Total Protein 01/09/2014 6.4  6.0 - 8.5 g/dL Final  . Albumin 01/09/2014 4.2  3.5 - 4.7 g/dL Final  . Globulin, Total 01/09/2014 2.2  1.5 - 4.5 g/dL Final  . Albumin/Globulin Ratio 01/09/2014 1.9  1.1 - 2.5 Final  . Total Bilirubin 01/09/2014 0.4  0.0 - 1.2 mg/dL Final  . Alkaline Phosphatase 01/09/2014 78  39 - 117 IU/L Final  . AST 01/09/2014 23  0 - 40 IU/L Final  . ALT 01/09/2014 24  0 - 32 IU/L Final  . TSH 01/09/2014 3.370  0.450 - 4.500 uIU/mL Final      Assessment/Plan  1. Type II or unspecified type diabetes mellitus with renal manifestations, not stated as uncontrolled(250.40)  - Hemoglobin A1c; Future - Basic metabolic panel; Future - Microalbumin, urine; Future - Insulin Glargine (LANTUS SOLOSTAR) 100 UNIT/ML Solostar Pen; Inject 40 units at bed to control diabetes  Dispense: 5 pen; Refill: 5  2. Unspecified essential hypertension controlled  3. Peripheral vascular disease, unspecified stable  4. Hypothyroidism - TSH; Future  5. Chronic kidney disease, stage III (moderate) - Basic metabolic panel; Future  6. Nausea On and off  7. Tremor uunchanged  8. Encounter for long-term (current) use of other medications - Digoxin level; Future

## 2014-01-20 ENCOUNTER — Other Ambulatory Visit: Payer: Self-pay | Admitting: *Deleted

## 2014-01-20 DIAGNOSIS — E559 Vitamin D deficiency, unspecified: Secondary | ICD-10-CM

## 2014-01-20 MED ORDER — ERGOCALCIFEROL 1.25 MG (50000 UT) PO CAPS: 50000.0000 [IU] | ORAL_CAPSULE | ORAL | Status: DC

## 2014-01-20 MED ORDER — OMEPRAZOLE 40 MG PO CPDR: DELAYED_RELEASE_CAPSULE | ORAL | Status: DC

## 2014-01-20 NOTE — Telephone Encounter (Signed)
[place]

## 2014-01-20 NOTE — Telephone Encounter (Signed)
Disregard message

## 2014-01-20 NOTE — Telephone Encounter (Signed)
[place]

## 2014-02-05 ENCOUNTER — Other Ambulatory Visit: Payer: Self-pay | Admitting: *Deleted

## 2014-02-05 MED ORDER — HYDROCHLOROTHIAZIDE 25 MG PO TABS: 25.0000 mg | ORAL_TABLET | Freq: Every day | ORAL | Status: DC

## 2014-02-05 NOTE — Telephone Encounter (Signed)
[place]

## 2014-02-06 ENCOUNTER — Encounter: Payer: Self-pay | Admitting: Internal Medicine

## 2014-02-07 ENCOUNTER — Other Ambulatory Visit: Payer: Self-pay | Admitting: *Deleted

## 2014-02-07 ENCOUNTER — Telehealth: Payer: Self-pay | Admitting: *Deleted

## 2014-02-07 MED ORDER — HYDROCODONE-ACETAMINOPHEN 5-325 MG PO TABS: ORAL_TABLET | ORAL | Status: DC

## 2014-02-07 NOTE — Telephone Encounter (Signed)
[place] faxed request

## 2014-02-07 NOTE — Telephone Encounter (Signed)
[employer] shipped patient's Assistance Medication--Lantus Nutritional therapist. Called patient to come by and pick up.  Order# [id number] Customer # [id number] Hu# [id number]

## 2014-02-12 ENCOUNTER — Other Ambulatory Visit: Payer: Self-pay | Admitting: *Deleted

## 2014-02-12 MED ORDER — HYDROCODONE-ACETAMINOPHEN 5-325 MG PO TABS: ORAL_TABLET | ORAL | Status: DC

## 2014-02-12 NOTE — Telephone Encounter (Signed)
Patient requested. Pharmacy had faxed a refill request for it Friday. It was printed then and faxed back to them same day. But yesterday they left a message and stated they cannot fill and patient needed to bring in a hard copy of the Rx. Patient called and will pick up today.

## 2014-02-26 ENCOUNTER — Other Ambulatory Visit: Payer: Self-pay | Admitting: *Deleted

## 2014-02-26 DIAGNOSIS — F419 Anxiety disorder, unspecified: Secondary | ICD-10-CM

## 2014-02-26 MED ORDER — ALPRAZOLAM 0.5 MG PO TABS: ORAL_TABLET | ORAL | Status: DC

## 2014-02-26 NOTE — Telephone Encounter (Signed)
Karen [name]

## 2014-03-14 ENCOUNTER — Other Ambulatory Visit: Payer: Self-pay | Admitting: *Deleted

## 2014-03-14 MED ORDER — HYDROCODONE-ACETAMINOPHEN 5-325 MG PO TABS: ORAL_TABLET | ORAL | Status: DC

## 2014-03-17 ENCOUNTER — Other Ambulatory Visit: Payer: Self-pay | Admitting: *Deleted

## 2014-03-17 DIAGNOSIS — E559 Vitamin D deficiency, unspecified: Secondary | ICD-10-CM

## 2014-03-17 MED ORDER — ERGOCALCIFEROL 1.25 MG (50000 UT) PO CAPS: 50000.0000 [IU] | ORAL_CAPSULE | ORAL | Status: DC

## 2014-03-17 MED ORDER — TIZANIDINE HCL 4 MG PO CAPS: ORAL_CAPSULE | ORAL | Status: DC

## 2014-03-19 ENCOUNTER — Other Ambulatory Visit: Payer: Self-pay | Admitting: *Deleted

## 2014-03-21 ENCOUNTER — Other Ambulatory Visit: Payer: Self-pay | Admitting: *Deleted

## 2014-03-28 ENCOUNTER — Telehealth: Payer: Self-pay | Admitting: *Deleted

## 2014-03-28 NOTE — Telephone Encounter (Signed)
Received patient's Lantus Solostar pen. Patient notified to pick up. Customer # [id number] Customer PO # [id number] Order # [id number] Delivery # [id number]

## 2014-03-31 ENCOUNTER — Other Ambulatory Visit (HOSPITAL_COMMUNITY): Payer: Self-pay | Admitting: Internal Medicine

## 2014-03-31 ENCOUNTER — Ambulatory Visit (INDEPENDENT_AMBULATORY_CARE_PROVIDER_SITE_OTHER): Payer: Medicare Other | Admitting: Internal Medicine

## 2014-03-31 ENCOUNTER — Encounter: Payer: Self-pay | Admitting: Internal Medicine

## 2014-03-31 ENCOUNTER — Ambulatory Visit (HOSPITAL_COMMUNITY)
Admission: RE | Admit: 2014-03-31 | Discharge: 2014-03-31 | Disposition: A | Discharge status: Home / Self Care | Payer: Medicare Other | Source: Ambulatory Visit | Attending: Internal Medicine | Admitting: Internal Medicine

## 2014-03-31 VITALS — BP 142/78 | HR 83 | Temp 97.9°F | Resp 18 | Ht 64.0 in | Wt 139.6 lb

## 2014-03-31 DIAGNOSIS — M7989 Other specified soft tissue disorders: Secondary | ICD-10-CM

## 2014-03-31 DIAGNOSIS — E119 Type 2 diabetes mellitus without complications: Secondary | ICD-10-CM | POA: Insufficient documentation

## 2014-03-31 DIAGNOSIS — Z8249 Family history of ischemic heart disease and other diseases of the circulatory system: Secondary | ICD-10-CM | POA: Insufficient documentation

## 2014-03-31 DIAGNOSIS — M79609 Pain in unspecified limb: Secondary | ICD-10-CM

## 2014-03-31 NOTE — Patient Instructions (Signed)
Use the hydrocodone regularly as ordered 4 times per day Apply heat and elevate your legs

## 2014-03-31 NOTE — Progress Notes (Signed)
Patient ID: Karen [name], female   DOB: Nov 09, 1923, 80 y.o.   MRN: [id number]   Location:  [facility]   Allergies  Allergen Reactions  . Celebrex [Celecoxib] Rash  . Doxycycline Rash  . Lyrica [Pregabalin] Rash  . Avandia [Rosiglitazone]   . Macrodantin [Nitrofurantoin Macrocrystal]   . Sulfa Antibiotics   . Vioxx [Rofecoxib]     Chief Complaint  Patient presents with  . Acute Visit    left leg pain, feels as though there is a puffy area above the back of knee    HPI: Patient is a 80 y.o. white female seen in the office today for acute visit with left leg pain.  Right leg also hurts.  Left hurts so bad she can't bend the knee--has to lift it up with her arms.  Right ankle was broken in April and still has some pain and swelling there.    Review of Systems:  Review of Systems  Constitutional: Negative for fever.  Respiratory: Negative for shortness of breath.   Cardiovascular: Negative for chest pain.  Musculoskeletal: Positive for back pain, joint pain and myalgias.     Past Medical History  Diagnosis Date  . Anemia   . Stroke   . Irregular heartbeat   . Sleep apnea   . Arthritis     body, neck  . TIA (transient ischemic attack) 2012  . Bladder disorder     over active  . Cancer     skin, removed  . Hiatal hernia   . Benign hypertensive heart disease   . CHF (congestive heart failure)   . Vitamin B12 deficiency   . Gastroparesis   . Hyperlipidemia   . Anxiety   . A-fib   . Spinal stenosis   . GERD (gastroesophageal reflux disease)   . Esophageal stricture   . Peripheral vascular disease, unspecified   . Nausea alone   . Diarrhea   . Abnormal involuntary movements(781.0)   . Inflammatory disease of breast   . Transient ischemic attack (TIA), and cerebral infarction without residual deficits(V12.54)   . Urinary tract infection, site not specified   . Benign hypertensive heart disease with congestive heart  failure   . Congestive heart failure, unspecified   . Nonspecific (abnormal) findings on radiological and other examination of skull and head   . Unspecified pruritic disorder   . Inflamed seborrheic keratosis   . Seborrheic keratosis   . Other B-complex deficiencies   . Chronic kidney disease, stage III (moderate)   . Type II or unspecified type diabetes mellitus with renal manifestations, not stated as uncontrolled   . Postmenopausal atrophic vaginitis   . Palpitations   . Mixed incontinence urge and stress (female)(female)   . Contact dermatitis and other eczema, due to unspecified cause   . Unspecified vitamin D deficiency   . Corns and callosities   . Gastroparesis   . Other specified visual disturbances   . Spasm of muscle   . Edema   . Pain in joint, lower leg   . Other and unspecified hyperlipidemia   . Anxiety state, unspecified   . Unspecified hereditary and idiopathic peripheral neuropathy   . Unspecified constipation   . Unspecified essential hypertension   . Unspecified hypothyroidism   . Reflux esophagitis   . Other functional disorder of bladder   . Osteoarthrosis, unspecified whether generalized or localized, unspecified site   . Cervicalgia   . Spinal stenosis, unspecified region  other than cervical   . Lumbago   . Unspecified sleep apnea   . Other malaise and fatigue   . Stricture and stenosis of esophagus     Past Surgical History  Procedure Laterality Date  . Back surgery      x 2  . Abdominal hysterectomy    . Thyroidectomy, partial  1965  . Cholecystectomy  1991  . Hammer toes    . Eye surgery  1994, 1997    bilateral cataract  . Hernia repair  1984aaaaaaaa  . Skin cancer destruction    . Esophageal  2004,2006    dilation, Dr Lyla Son    Social History:   reports that she has never smoked. She has never used smokeless tobacco. She reports that she does not drink alcohol or use illicit drugs.  Family History  Problem Relation Age of Onset   . Cancer Mother     ? stomach  . Heart disease Father   . Hyperlipidemia Father   . Hypertension Father   . Heart attack Father   . Heart disease Brother   . Kidney disease Daughter   . Heart disease Brother   . Cancer Son     Medications: Patient's Medications  New Prescriptions   No medications on file  Previous Medications   ALCOHOL SWABS (ALCOHOL PREP) 70 % PADS       ALPRAZOLAM (XANAX) 0.5 MG TABLET    Take one tablet by mouth up to 3 times daily as needed for anxiety or sleep   ASPIRIN 325 MG TABLET    Take 325 mg by mouth daily.   BLOOD GLUCOSE MONITORING SUPPL (BAYER BREEZE 2 SYSTEM) W/DEVICE KIT       CRANBERRY PO    Take 300 mg by mouth daily.   DESOXIMETASONE (TOPICORT) 0.25 % CREAM    Apply to lesion daily   DIGOXIN (LANOXIN) 0.125 MG TABLET    Take one tablet by mouth on Mondays,Wednedsdays,Fridays and Saturdays   DIPHENHYDRAMINE-ZINC ACETATE (BENADRYL ITCH STOPPING) CREAM    Apply 1 application topically 3 (three) times daily as needed.   ERGOCALCIFEROL (VITAMIN D2) 50000 UNITS CAPSULE    Take 1 capsule (50,000 Units total) by mouth once a week.   GLUCOSE BLOOD (BAYER BREEZE 2 TEST VI)    Check blood sugar 3 x daily as directed. DX:250.02   HYDROCHLOROTHIAZIDE (HYDRODIURIL) 25 MG TABLET    Take 1 tablet (25 mg total) by mouth daily.   HYDROCODONE-ACETAMINOPHEN (NORCO/VICODIN) 5-325 MG PER TABLET    Take one tablet by mouth four times daily as needed for pain   INSULIN ASPART (NOVOLOG FLEXPEN) 100 UNIT/ML FLEXPEN    Inject into the skin 3 (three) times daily with meals. 10 units in am, 12 units @ lunch, 14 units @ dinner   INSULIN GLARGINE (LANTUS SOLOSTAR) 100 UNIT/ML SOLOSTAR PEN    Inject 40 units at bed to control diabetes   INSULIN PEN NEEDLE 29G X 12.7MM MISC    Use as directed to administer insulin. DX: 250.40   INSULIN SYRINGE-NEEDLE U-100 (EASY COMFORT INSULIN SYRINGE) 30G X 1/2" 0.5 ML MISC    Use as Directed with insulin injections. Dx: 250.00   LANCET  DEVICES (LANCING DEVICE) MISC       LEVOTHYROXINE (SYNTHROID, LEVOTHROID) 100 MCG TABLET    Take one tablet by mouth 30 minutes before breakfast for thyroid supplement   OMEPRAZOLE (PRILOSEC) 40 MG CAPSULE    Take one capsule by mouth twice daily  ONDANSETRON (ZOFRAN) 4 MG TABLET    Take 1 tablet (4 mg total) by mouth every 4 (four) hours as needed for nausea or vomiting.   TIZANIDINE (ZANAFLEX) 4 MG CAPSULE    Take one tablet by mouth three times daily as needed to relax muscles.   TRIMETHOPRIM (TRIMPEX) 100 MG TABLET    Take one tablet by mouth every night at bedtime to prevent bladder infection   VITAMIN E PO    Take 400 Units by mouth daily.  Modified Medications   No medications on file  Discontinued Medications   SACCHAROMYCES BOULARDII (FLORASTOR) 250 MG CAPSULE    Take 1 capsule (250 mg total) by mouth 2 (two) times daily.     Physical Exam: Filed Vitals:   03/31/14 1146  BP: 142/78  Pulse: 83  Temp: 97.9 F (36.6 C)  TempSrc: Oral  Resp: 18  Height: _0  (1.626 m)  Weight: 139 lb 9.6 oz (63.322 kg)  SpO2: 97%  Physical Exam  Constitutional: She is oriented to person, place, and time.  Cardiovascular: Normal rate, regular rhythm, normal heart sounds and intact distal pulses.   Pulmonary/Chest: Effort normal and breath sounds normal. No respiratory distress.  Abdominal: Bowel sounds are normal.  Musculoskeletal:  Decreased ROM of left knee;  Tenderness and palpable swelling of left posterior thigh region, +homan's sign  Neurological: She is alert and oriented to person, place, and time.  Skin: Skin is warm and dry. No erythema.  Psychiatric: She has a normal mood and affect.     Labs reviewed: Basic Metabolic Panel:  Recent Labs  07/11/13 0917 08/08/13 1409 10/14/13 0934 01/09/14 0841  NA 144 139 143 140  K 4.6 5.3* 4.5 4.9  CL 99 97 101 98  CO2 _1 GLUCOSE 116* 94 68 125*  BUN 21 30* 23 20  CREATININE 1.28* 1.67* 1.11* 1.29*  CALCIUM 9.3 9.4  9.2 9.0  TSH 5.360*  --  1.270 3.370   Liver Function Tests:  Recent Labs  07/11/13 0917 01/09/14 0841  AST 30 23  ALT 31 24  ALKPHOS 92 78  BILITOT 0.3 0.4  PROT 6.7 6.4   No results found for this basename: LIPASE, AMYLASE,  in the last 8760 hours No results found for this basename: AMMONIA,  in the last 8760 hours CBC:  Recent Labs  08/08/13 1409  WBC 14.2*  NEUTROABS 9.6*  HGB 11.8  HCT 36.7  MCV 93  PLT 483*   Lipid Panel:  Recent Labs  05/13/13 0858 10/14/13 0934  HDL 27* 43  LDLCALC 96 93  TRIG 180* 99  CHOLHDL 5.9* 3.6   Lab Results  Component Value Date   HGBA1C 7.3* 01/09/2014     Assessment/Plan 1. Swelling of lower extremity -possibly has bilateral vs. Only left-sided DVT vs. Baker's cyst - US Venous Img Lower Bilateral; Future stat -only on asa 35m since TIA in 2012 -also has known arthritis of left knee and pain that radiates from her back down her left leg that have been ongoing for months from review of Dr. GRolly Salternotes -cont hydrocodone for pain Labs/tests ordered:   Orders Placed This Encounter  Procedures  . UKoreaVenous Img Lower Bilateral    Standing Status: Future     Number of Occurrences:      Standing Expiration Date: 06/01/2015    Order Specific Question:  Reason for Exam (SYMPTOM  OR DIAGNOSIS REQUIRED)    Answer:  bilateral LE swelling  and pain behind knees;  left also edematous    Order Specific Question:  Preferred imaging location?    Answer:  [facility]    Order Specific Question:  Call Report- Best Contact Number?    Answer:  [phone]    Next appt: keep with Dr. Nyoka Cowden

## 2014-04-02 ENCOUNTER — Telehealth: Payer: Self-pay | Admitting: *Deleted

## 2014-04-02 DIAGNOSIS — M25562 Pain in left knee: Secondary | ICD-10-CM

## 2014-04-02 NOTE — Telephone Encounter (Signed)
Upon further review from [place] and Dr. Rolly Salter notes, pt has been c/o this pain for several months though it may be worse now.  She has some arthritis of the knees as well as lumbar spinal stenosis with nerve pain radiating down the legs.  I would suggest an xray of her left knee as the next step due to the localized pain there.  She may then need a steroid injection in her knee here in the office if she is agreeable to this.

## 2014-04-02 NOTE — Telephone Encounter (Signed)
Spoke with patient regarding results, she stated that there are not changes but will be getting the x-ray tomorrow. Heat and a pain pill does help some.

## 2014-04-02 NOTE — Telephone Encounter (Signed)
Message copied by Eilene Ghazi on Wed Apr 02, 2014  5:04 PM ------      Message from: [place], Rexene Edison      Created: Tue Apr 01, 2014  7:49 PM       Unclear what has caused her left leg to be swollen and both legs to be painful.  No DVT and no baker's cysts either. ------

## 2014-04-02 NOTE — Telephone Encounter (Signed)
Patient just saw you on 03/31/2014 for leg pain and her test came back negative for DVT. Patient is still having leg pain and wants to know what the next step is. Please Advise.

## 2014-04-02 NOTE — Telephone Encounter (Signed)
Patient Notified and order placed for Knee X-ray. Patient aware she can just walk in for the appointment.

## 2014-04-03 ENCOUNTER — Ambulatory Visit
Admission: RE | Admit: 2014-04-03 | Discharge: 2014-04-03 | Disposition: A | Discharge status: Home / Self Care | Payer: Medicare Other | Source: Ambulatory Visit | Attending: Internal Medicine | Admitting: Internal Medicine

## 2014-04-03 DIAGNOSIS — M25562 Pain in left knee: Secondary | ICD-10-CM

## 2014-04-04 ENCOUNTER — Telehealth: Payer: Self-pay | Admitting: *Deleted

## 2014-04-04 NOTE — Telephone Encounter (Signed)
Message copied by Eilene Ghazi on Fri Apr 04, 2014  3:35 PM ------      Message from: [place], [place] L      Created: Thu Apr 03, 2014  5:29 PM       Arthritis throughout knee worst in the middle part      If she wants, she can come in for a knee injection to help with her arthritis pain ------

## 2014-04-04 NOTE — Telephone Encounter (Signed)
Message copied by Eilene Ghazi on Fri Apr 04, 2014  3:50 PM ------      Message from: [place], Rexene Edison      Created: Tue Apr 01, 2014  7:49 PM       Unclear what has caused her left leg to be swollen and both legs to be painful.  No DVT and no baker's cysts either. ------

## 2014-04-04 NOTE — Telephone Encounter (Signed)
Spoke with patient regarding venous doppler, she stated that she is still having severe pain and using heat and pain medication to cope with pain. She also states that she would be having x-ray soon as well.

## 2014-04-04 NOTE — Telephone Encounter (Signed)
Spoke with patient regarding results of x-ray and set up appointment for Thurs (04/10/14) at 3:15 pm.

## 2014-04-10 ENCOUNTER — Ambulatory Visit (INDEPENDENT_AMBULATORY_CARE_PROVIDER_SITE_OTHER): Payer: Medicare Other | Admitting: Internal Medicine

## 2014-04-10 ENCOUNTER — Encounter: Payer: Self-pay | Admitting: Internal Medicine

## 2014-04-10 VITALS — BP 142/64 | HR 92 | Temp 98.0°F | Resp 18 | Ht 64.0 in | Wt 138.8 lb

## 2014-04-10 DIAGNOSIS — M1711 Unilateral primary osteoarthritis, right knee: Secondary | ICD-10-CM

## 2014-04-10 DIAGNOSIS — M1712 Unilateral primary osteoarthritis, left knee: Secondary | ICD-10-CM

## 2014-04-10 DIAGNOSIS — M1710 Unilateral primary osteoarthritis, unspecified knee: Secondary | ICD-10-CM

## 2014-04-10 DIAGNOSIS — E1129 Type 2 diabetes mellitus with other diabetic kidney complication: Secondary | ICD-10-CM

## 2014-04-10 MED ORDER — TRIAMCINOLONE ACETONIDE 40 MG/ML IJ SUSP
40.0000 mg | Freq: Once | INTRAMUSCULAR | Status: AC
  Administered 2014-04-10: 40 mg via INTRA_ARTICULAR

## 2014-04-10 NOTE — Progress Notes (Signed)
Patient ID: Karen [name], female   DOB: 1923-12-21, 80 y.o.   MRN: [id number]   Location:  [facility]   Allergies  Allergen Reactions  . Celebrex [Celecoxib] Rash  . Doxycycline Rash  . Lyrica [Pregabalin] Rash  . Avandia [Rosiglitazone]   . Macrodantin [Nitrofurantoin Macrocrystal]   . Sulfa Antibiotics   . Vioxx [Rofecoxib]     Chief Complaint  Patient presents with  . Acute Visit    HPI: Patient is a 80 y.o. white female seen in the office today for an acute visit for left knee injection.  Pain is more severe in left than right knee.  xrays that were done on left knee showed tricompartmental arthritis.  Doppler was negative in both legs.  Review of Systems:  Review of Systems  Constitutional: Negative for fever and chills.  HENT: Negative for congestion.   Eyes: Negative for blurred vision.  Respiratory: Negative for cough and shortness of breath.   Cardiovascular: Negative for chest pain.  Gastrointestinal: Negative for abdominal pain.  Musculoskeletal: Positive for joint pain and myalgias. Negative for falls.    Past Medical History  Diagnosis Date  . Anemia   . Stroke   . Irregular heartbeat   . Sleep apnea   . Arthritis     body, neck  . TIA (transient ischemic attack) 2012  . Bladder disorder     over active  . Cancer     skin, removed  . Hiatal hernia   . Benign hypertensive heart disease   . CHF (congestive heart failure)   . Vitamin B12 deficiency   . Gastroparesis   . Hyperlipidemia   . Anxiety   . A-fib   . Spinal stenosis   . GERD (gastroesophageal reflux disease)   . Esophageal stricture   . Peripheral vascular disease, unspecified   . Nausea alone   . Diarrhea   . Abnormal involuntary movements(781.0)   . Inflammatory disease of breast   . Transient ischemic attack (TIA), and cerebral infarction without residual deficits(V12.54)   . Urinary tract infection, site not specified   . Benign  hypertensive heart disease with congestive heart failure   . Congestive heart failure, unspecified   . Nonspecific (abnormal) findings on radiological and other examination of skull and head   . Unspecified pruritic disorder   . Inflamed seborrheic keratosis   . Seborrheic keratosis   . Other B-complex deficiencies   . Chronic kidney disease, stage III (moderate)   . Type II or unspecified type diabetes mellitus with renal manifestations, not stated as uncontrolled   . Postmenopausal atrophic vaginitis   . Palpitations   . Mixed incontinence urge and stress (female)(female)   . Contact dermatitis and other eczema, due to unspecified cause   . Unspecified vitamin D deficiency   . Corns and callosities   . Gastroparesis   . Other specified visual disturbances   . Spasm of muscle   . Edema   . Pain in joint, lower leg   . Other and unspecified hyperlipidemia   . Anxiety state, unspecified   . Unspecified hereditary and idiopathic peripheral neuropathy   . Unspecified constipation   . Unspecified essential hypertension   . Unspecified hypothyroidism   . Reflux esophagitis   . Other functional disorder of bladder   . Osteoarthrosis, unspecified whether generalized or localized, unspecified site   . Cervicalgia   . Spinal stenosis, unspecified region other than cervical   . Lumbago   .  Unspecified sleep apnea   . Other malaise and fatigue   . Stricture and stenosis of esophagus     Past Surgical History  Procedure Laterality Date  . Back surgery      x 2  . Abdominal hysterectomy    . Thyroidectomy, partial  1965  . Cholecystectomy  1991  . Hammer toes    . Eye surgery  1994, 1997    bilateral cataract  . Hernia repair  1984aaaaaaaa  . Skin cancer destruction    . Esophageal  2004,2006    dilation, Dr Lyla Son    Social History:   reports that she has never smoked. She has never used smokeless tobacco. She reports that she does not drink alcohol or use illicit  drugs.  Family History  Problem Relation Age of Onset  . Cancer Mother     ? stomach  . Heart disease Father   . Hyperlipidemia Father   . Hypertension Father   . Heart attack Father   . Heart disease Brother   . Kidney disease Daughter   . Heart disease Brother   . Cancer Son     Medications: Patient's Medications  New Prescriptions   No medications on file  Previous Medications   ALCOHOL SWABS (ALCOHOL PREP) 70 % PADS       ALPRAZOLAM (XANAX) 0.5 MG TABLET    Take one tablet by mouth up to 3 times daily as needed for anxiety or sleep   ASPIRIN 325 MG TABLET    Take 325 mg by mouth daily.   BLOOD GLUCOSE MONITORING SUPPL (BAYER BREEZE 2 SYSTEM) W/DEVICE KIT       CRANBERRY PO    Take 300 mg by mouth daily.   DESOXIMETASONE (TOPICORT) 0.25 % CREAM    Apply to lesion daily   DIGOXIN (LANOXIN) 0.125 MG TABLET    Take one tablet by mouth on Mondays,Wednedsdays,Fridays and Saturdays   DIPHENHYDRAMINE-ZINC ACETATE (BENADRYL ITCH STOPPING) CREAM    Apply 1 application topically 3 (three) times daily as needed.   ERGOCALCIFEROL (VITAMIN D2) 50000 UNITS CAPSULE    Take 1 capsule (50,000 Units total) by mouth once a week.   GLUCOSE BLOOD (BAYER BREEZE 2 TEST VI)    Check blood sugar 3 x daily as directed. DX:250.02   HYDROCHLOROTHIAZIDE (HYDRODIURIL) 25 MG TABLET    Take 1 tablet (25 mg total) by mouth daily.   HYDROCODONE-ACETAMINOPHEN (NORCO/VICODIN) 5-325 MG PER TABLET    Take one tablet by mouth four times daily as needed for pain   INSULIN ASPART (NOVOLOG FLEXPEN) 100 UNIT/ML FLEXPEN    Inject into the skin 3 (three) times daily with meals. 10 units in am, 12 units @ lunch, 14 units @ dinner   INSULIN GLARGINE (LANTUS SOLOSTAR) 100 UNIT/ML SOLOSTAR PEN    Inject 40 units at bed to control diabetes   INSULIN PEN NEEDLE 29G X 12.7MM MISC    Use as directed to administer insulin. DX: 250.40   INSULIN SYRINGE-NEEDLE U-100 (EASY COMFORT INSULIN SYRINGE) 30G X 1/2" 0.5 ML MISC    Use as  Directed with insulin injections. Dx: 250.00   LANCET DEVICES (LANCING DEVICE) MISC       LEVOTHYROXINE (SYNTHROID, LEVOTHROID) 100 MCG TABLET    Take one tablet by mouth 30 minutes before breakfast for thyroid supplement   OMEPRAZOLE (PRILOSEC) 40 MG CAPSULE    Take one capsule by mouth twice daily   ONDANSETRON (ZOFRAN) 4 MG TABLET    Take  1 tablet (4 mg total) by mouth every 4 (four) hours as needed for nausea or vomiting.   TIZANIDINE (ZANAFLEX) 4 MG CAPSULE    Take one tablet by mouth three times daily as needed to relax muscles.   TRIMETHOPRIM (TRIMPEX) 100 MG TABLET    Take one tablet by mouth every night at bedtime to prevent bladder infection   VITAMIN E PO    Take 400 Units by mouth daily.  Modified Medications   No medications on file  Discontinued Medications   No medications on file     Physical Exam: Filed Vitals:   04/10/14 1536  BP: 142/64  Pulse: 92  Temp: 98 F (36.7 C)  TempSrc: Oral  Resp: 18  Height: _0  (1.626 m)  Weight: 138 lb 12.8 oz (62.959 kg)  SpO2: 94%  Physical Exam  Cardiovascular: Normal rate and regular rhythm.   Pulmonary/Chest: Effort normal and breath sounds normal. No respiratory distress.  Abdominal: Soft. Bowel sounds are normal.  Musculoskeletal:  Walks with limp with left leg;  Tenderness over medial aspect of bilateral knees and left posterior thigh  Neurological: She is alert.  Skin: Skin is warm and dry.  Psychiatric: She has a normal mood and affect.     Labs reviewed: Basic Metabolic Panel:  Recent Labs  07/11/13 0917 08/08/13 1409 10/14/13 0934 01/09/14 0841  NA 144 139 143 140  K 4.6 5.3* 4.5 4.9  CL 99 97 101 98  CO2 _1 GLUCOSE 116* 94 68 125*  BUN 21 30* 23 20  CREATININE 1.28* 1.67* 1.11* 1.29*  CALCIUM 9.3 9.4 9.2 9.0  TSH 5.360*  --  1.270 3.370   Liver Function Tests:  Recent Labs  07/11/13 0917 01/09/14 0841  AST 30 23  ALT 31 24  ALKPHOS 92 78  BILITOT 0.3 0.4  PROT 6.7 6.4    Recent Labs  08/08/13 1409  WBC 14.2*  NEUTROABS 9.6*  HGB 11.8  HCT 36.7  MCV 93  PLT 483*   Lipid Panel:  Recent Labs  05/13/13 0858 10/14/13 0934  HDL 27* 43  LDLCALC 96 93  TRIG 180* 99  CHOLHDL 5.9* 3.6   Lab Results  Component Value Date   HGBA1C 7.3* 01/09/2014  bilateral lower extremity venous duplex 03/31/14:  No evidence of deep vein or superficial thrombosis involving the right lower extremity and left lower extremity. - No evidence of Baker&'s cyst on the right or left.  xrays left knee 04/03/14:  Osseous demineralization with tricompartmental osteoarthritic changes greatest at medial compartment. No acute osseous abnormalities.   Assessment/Plan 1. Primary osteoarthritis of left knee -lidocaine and steroid injection performed by medial approach to left knee, then patient requested her right knee also be injected due to immediate benefit -cont to use cane -may benefit from a walker in the future  2. Primary osteoarthritis of right knee -right knee injection also then performed  3. Type II or unspecified type diabetes mellitus with renal manifestations, not stated as uncontrolled -pt aware that injections with steroid will increase her blood glucose readings -discussed dietary changes to help with hyperglycemia  Labs/tests ordered:  None at this time Next appt:  Keep with Dr. Nyoka Cowden in october

## 2014-04-14 ENCOUNTER — Other Ambulatory Visit: Payer: Self-pay | Admitting: *Deleted

## 2014-04-14 MED ORDER — HYDROCODONE-ACETAMINOPHEN 5-325 MG PO TABS: ORAL_TABLET | ORAL | Status: DC

## 2014-05-14 ENCOUNTER — Other Ambulatory Visit: Payer: Medicare Other

## 2014-05-14 ENCOUNTER — Other Ambulatory Visit: Payer: Self-pay | Admitting: *Deleted

## 2014-05-14 DIAGNOSIS — N183 Chronic kidney disease, stage 3 unspecified: Secondary | ICD-10-CM

## 2014-05-14 DIAGNOSIS — Z79899 Other long term (current) drug therapy: Secondary | ICD-10-CM

## 2014-05-14 DIAGNOSIS — E559 Vitamin D deficiency, unspecified: Secondary | ICD-10-CM

## 2014-05-14 DIAGNOSIS — E1129 Type 2 diabetes mellitus with other diabetic kidney complication: Secondary | ICD-10-CM

## 2014-05-14 DIAGNOSIS — E039 Hypothyroidism, unspecified: Secondary | ICD-10-CM

## 2014-05-14 MED ORDER — ERGOCALCIFEROL 1.25 MG (50000 UT) PO CAPS: 50000.0000 [IU] | ORAL_CAPSULE | ORAL | Status: DC

## 2014-05-14 NOTE — Telephone Encounter (Signed)
[place]

## 2014-05-15 ENCOUNTER — Other Ambulatory Visit: Payer: Medicare Other

## 2014-05-15 LAB — MICROALBUMIN, URINE: MICROALBUM., U, RANDOM: 8.9 ug/mL (ref 0.0–17.0)

## 2014-05-15 LAB — BASIC METABOLIC PANEL
BUN / CREAT RATIO: 14 (ref 11–26)
BUN: 19 mg/dL (ref 8–27)
CO2: 27 mmol/L (ref 18–29)
Calcium: 9.3 mg/dL (ref 8.7–10.3)
Chloride: 96 mmol/L — ABNORMAL LOW (ref 97–108)
Creatinine, Ser: 1.34 mg/dL — ABNORMAL HIGH (ref 0.57–1.00)
GFR calc non Af Amer: 35 mL/min/{1.73_m2} — ABNORMAL LOW (ref >59)
GFR, EST AFRICAN AMERICAN: 41 mL/min/{1.73_m2} — AB (ref >59)
Glucose: 98 mg/dL (ref 65–99)
Potassium: 4.5 mmol/L (ref 3.5–5.2)
Sodium: 137 mmol/L (ref 134–144)

## 2014-05-15 LAB — HEMOGLOBIN A1C
Est. average glucose Bld gHb Est-mCnc: 183 mg/dL
Hgb A1c MFr Bld: 8 % — ABNORMAL HIGH (ref 4.8–5.6)

## 2014-05-15 LAB — DIGOXIN LEVEL: Digoxin Level: 1 ng/mL (ref 0.9–2.0)

## 2014-05-15 LAB — TSH: TSH: 4.64 u[IU]/mL — ABNORMAL HIGH (ref 0.450–4.500)

## 2014-05-19 ENCOUNTER — Other Ambulatory Visit: Payer: Self-pay | Admitting: *Deleted

## 2014-05-19 MED ORDER — HYDROCODONE-ACETAMINOPHEN 5-325 MG PO TABS: ORAL_TABLET | ORAL | Status: DC

## 2014-05-19 NOTE — Telephone Encounter (Signed)
[place]. Patient Notified to pick up

## 2014-05-20 ENCOUNTER — Encounter: Payer: Self-pay | Admitting: Internal Medicine

## 2014-05-20 ENCOUNTER — Ambulatory Visit (INDEPENDENT_AMBULATORY_CARE_PROVIDER_SITE_OTHER): Payer: Medicare Other | Admitting: Internal Medicine

## 2014-05-20 VITALS — BP 148/70 | HR 84 | Temp 97.9°F | Resp 12 | Ht 64.5 in | Wt 141.0 lb

## 2014-05-20 DIAGNOSIS — E785 Hyperlipidemia, unspecified: Secondary | ICD-10-CM

## 2014-05-20 DIAGNOSIS — J069 Acute upper respiratory infection, unspecified: Secondary | ICD-10-CM

## 2014-05-20 DIAGNOSIS — L821 Other seborrheic keratosis: Secondary | ICD-10-CM

## 2014-05-20 DIAGNOSIS — IMO0002 Reserved for concepts with insufficient information to code with codable children: Secondary | ICD-10-CM

## 2014-05-20 DIAGNOSIS — G4733 Obstructive sleep apnea (adult) (pediatric): Secondary | ICD-10-CM | POA: Insufficient documentation

## 2014-05-20 DIAGNOSIS — E039 Hypothyroidism, unspecified: Secondary | ICD-10-CM

## 2014-05-20 DIAGNOSIS — I10 Essential (primary) hypertension: Secondary | ICD-10-CM

## 2014-05-20 DIAGNOSIS — E1129 Type 2 diabetes mellitus with other diabetic kidney complication: Secondary | ICD-10-CM

## 2014-05-20 DIAGNOSIS — E1165 Type 2 diabetes mellitus with hyperglycemia: Secondary | ICD-10-CM

## 2014-05-20 DIAGNOSIS — N183 Chronic kidney disease, stage 3 unspecified: Secondary | ICD-10-CM

## 2014-05-20 DIAGNOSIS — R609 Edema, unspecified: Secondary | ICD-10-CM

## 2014-05-20 MED ORDER — FUROSEMIDE 40 MG PO TABS: ORAL_TABLET | ORAL | Status: DC

## 2014-05-20 MED ORDER — HYDROCOD POLST-CHLORPHEN POLST 10-8 MG/5ML PO LQCR: ORAL | Status: DC

## 2014-05-20 MED ORDER — DESOXIMETASONE 0.25 % EX CREA: TOPICAL_CREAM | CUTANEOUS | Status: DC

## 2014-05-20 MED ORDER — ASPIRIN EC 81 MG PO TBEC: DELAYED_RELEASE_TABLET | ORAL | Status: DC

## 2014-05-20 NOTE — Progress Notes (Signed)
Patient ID: Karen [name], female   DOB: [birth year], 80 y.o.   MRN: [id number]    Facility  [facility]    Place of Service:   OFFICE   Allergies  Allergen Reactions  . Celebrex [Celecoxib] Rash  . Doxycycline Rash  . Lyrica [Pregabalin] Rash  . Avandia [Rosiglitazone]   . Macrodantin [Nitrofurantoin Macrocrystal]   . Sulfa Antibiotics   . Vioxx [Rofecoxib]     Chief Complaint  Patient presents with  . Medical Management of Chronic Issues    4 month follow-up   . URI    Cold symptoms since friday   . Edema    Feet and ankle swelling x 4-5 days   . Immunizations    Discuss flu vaccine, patient with cold symptoms today     HPI:  Sore throat, sneeze, cough. Using cough med, throat not helping  Right arm numb  Oct 9th after waking up from a nap. Had some neck pain.Headache after. Arm OK after 30 minutes.  Wants to start using 81 mg ASA instead of 325.  Tizanidine makes her light headed.  Ankles swollen for about a week. Has varicose veins. Swollen in morning, but not as bad as the rest of the day.  Has sleep apnea. Used to be on CPAP. Old machine is very old, about 15 years.  Urinary incontinence. No dysuria.  Medications: Patient's Medications  New Prescriptions   No medications on file  Previous Medications   ALCOHOL SWABS (ALCOHOL PREP) 70 % PADS       ALPRAZOLAM (XANAX) 0.5 MG TABLET    Take one tablet by mouth up to 3 times daily as needed for anxiety or sleep   ASPIRIN 325 MG TABLET    Take 325 mg by mouth daily.   BLOOD GLUCOSE MONITORING SUPPL (BAYER BREEZE 2 SYSTEM) W/DEVICE KIT       CRANBERRY PO    Take 300 mg by mouth daily.   DIGOXIN (LANOXIN) 0.125 MG TABLET    Take one tablet by mouth on Mondays,Wednedsdays,Fridays and Saturdays   DIPHENHYDRAMINE-ZINC ACETATE (BENADRYL ITCH STOPPING) CREAM    Apply 1 application topically 3 (three) times daily as needed.   ERGOCALCIFEROL (VITAMIN D2) 50000 UNITS CAPSULE    Take 1 capsule (50,000 Units total) by mouth  once a week.   GLUCOSE BLOOD (BAYER BREEZE 2 TEST VI)    Check blood sugar 3 x daily as directed. DX:250.02   HYDROCHLOROTHIAZIDE (HYDRODIURIL) 25 MG TABLET    Take 1 tablet (25 mg total) by mouth daily.   HYDROCODONE-ACETAMINOPHEN (NORCO/VICODIN) 5-325 MG PER TABLET    Take one tablet by mouth four times daily as needed for pain   INSULIN ASPART (NOVOLOG FLEXPEN) 100 UNIT/ML FLEXPEN    Inject into the skin 3 (three) times daily with meals. 10 units in am, 12 units @ lunch, 14 units @ dinner   INSULIN GLARGINE (LANTUS SOLOSTAR) 100 UNIT/ML SOLOSTAR PEN    Inject 40 units at bed to control diabetes   INSULIN PEN NEEDLE 29G X 12.7MM MISC    Use as directed to administer insulin. DX: 250.40   INSULIN SYRINGE-NEEDLE U-100 (EASY COMFORT INSULIN SYRINGE) 30G X 1/2" 0.5 ML MISC    Use as Directed with insulin injections. Dx: 250.00   LANCET DEVICES (LANCING DEVICE) MISC       LEVOTHYROXINE (SYNTHROID, LEVOTHROID) 100 MCG TABLET    Take one tablet by mouth 30 minutes before breakfast for thyroid supplement   OMEPRAZOLE (PRILOSEC) 40  MG CAPSULE    Take one capsule by mouth twice daily   ONDANSETRON (ZOFRAN) 4 MG TABLET    Take 1 tablet (4 mg total) by mouth every 4 (four) hours as needed for nausea or vomiting.   TIZANIDINE (ZANAFLEX) 4 MG CAPSULE    Take one tablet by mouth three times daily as needed to relax muscles.   TRIMETHOPRIM (TRIMPEX) 100 MG TABLET    Take one tablet by mouth every night at bedtime to prevent bladder infection   VITAMIN E PO    Take 400 Units by mouth daily.  Modified Medications   Modified Medication Previous Medication   DESOXIMETASONE (TOPICORT) 0.25 % CREAM desoximetasone (TOPICORT) 0.25 % cream      Apply to lesion daily    Apply to lesion daily  Discontinued Medications   No medications on file     Review of Systems  Constitutional: Positive for fatigue. Negative for fever, chills, activity change, appetite change and unexpected weight change.  HENT: Negative.     Eyes: Negative.   Respiratory: Positive for shortness of breath. Negative for wheezing.   Cardiovascular: Positive for leg swelling. Negative for chest pain and palpitations.       Bilateral mild peripheral arterial disease. She has been evaluated by Dr. Fabienne Bruns, vascular surgery, on 06/21/12. He felt that she had bilateral mild peripheral arterial disease. Leg discomfort seems to be more related to her chronic back problems. She has mild external iliac artery stenosis, but does not have severe occlusive disease. No surgical intervention is warranted.  Gastrointestinal:       Spastic colon. Intermittent diarrhea. Has more problems with chronic constipation.  Endocrine: Negative for polydipsia, polyphagia and polyuria.       Diabetic. History of thyroid problems. Thyroid has been removed. She is on supplements.  Genitourinary:       Urinary leakage and incontinence. Nocturia. Denies dysuria.  Musculoskeletal: Positive for arthralgias, back pain, gait problem and joint swelling.       Complaints of muscular weakness. Chronic backache. Arthritis in other joints. Unsteady gait. Bilateral lower leg discomfort. Left knee pain and swelling. Enlarged end of the right clavicle at the manubrium.  Skin:       Seborrheic keratosis of the left breast. Generalized itching without rash.  Neurological: Positive for tremors.       Has difficulty with balance. There are episodes of dizziness. She experiences numbness in her feet. She walks wobbly and unsteady. His numbness and weakness in the left leg.  Hematological: Negative.   Psychiatric/Behavioral:       Chronic anxiety. Sleeps okay.    Filed Vitals:   05/20/14 1345  BP: 148/70  Pulse: 84  Temp: 97.9 F (36.6 C)  TempSrc: Oral  Resp: 12  Height: 5' 4.5" (1.638 m)  Weight: 141 lb (63.957 kg)  SpO2: 98%   Body mass index is 23.84 kg/(m^2).  Physical Exam  Constitutional: She is oriented to person, place, and time.  Frail, thin,  elderly female.  HENT:  Head: Normocephalic and atraumatic.  Mouth/Throat: No oropharyngeal exudate.  Eyes: Conjunctivae and EOM are normal. Pupils are equal, round, and reactive to light.  Neck: No JVD present. No tracheal deviation present.  Nuchal rigidity. History thyroidectomy.  Cardiovascular: Normal rate.  Exam reveals no gallop and no friction rub.   No murmur heard. Irregularly irregular rhythm. Diminished pedal pulses  Pulmonary/Chest: No respiratory distress. She has no wheezes. She has no rales.  Abdominal: Soft. Bowel sounds  are normal. She exhibits no distension and no mass. There is no tenderness.  Musculoskeletal: She exhibits edema (2+ bipedal).  Stiff neck. Shoulder discomfort on palpation. Unstable gait. Tender processes of the lumbar area. Bilateral lower paraspinal muscular tenderness. Bulbous, tender knees with increased stiffness and crepitus. Left leg worse than the right.  Lymphadenopathy:    She has no cervical adenopathy.  Neurological: She is alert and oriented to person, place, and time. No cranial nerve deficit. Coordination abnormal.  Difficulty with balance and with walking.  Skin: No rash noted. No erythema. No pallor.  Seborrheic keratosis left breast.  Psychiatric: Her behavior is normal. Thought content normal.  Moderate anxiety.     Labs reviewed: Appointment on 05/14/2014  Component Date Value Ref Range Status  . Hemoglobin A1C 05/14/2014 8.0* 4.8 - 5.6 % Final   Comment:          Increased risk for diabetes: 5.7 - 6.4                                   Diabetes: >6.4                                   Glycemic control for adults with diabetes: <7.0  . Estimated average glucose 05/14/2014 183   Final  . Glucose 05/14/2014 98  65 - 99 mg/dL Final  . BUN 05/14/2014 19  8 - 27 mg/dL Final  . Creatinine, Ser 05/14/2014 1.34* 0.57 - 1.00 mg/dL Final  . GFR calc non Af Amer 05/14/2014 35* >59 mL/min/1.73 Final  . GFR calc Af Amer 05/14/2014 41* >59  mL/min/1.73 Final  . BUN/Creatinine Ratio 05/14/2014 14  11 - 26 Final  . Sodium 05/14/2014 137  134 - 144 mmol/L Final  . Potassium 05/14/2014 4.5  3.5 - 5.2 mmol/L Final  . Chloride 05/14/2014 96* 97 - 108 mmol/L Final  . CO2 05/14/2014 27  18 - 29 mmol/L Final  . Calcium 05/14/2014 9.3  8.7 - 10.3 mg/dL Final  . Digoxin Level 05/14/2014 1.0  0.9 - 2.0 ng/mL Final   Comment: Concentrations above 2.0 ng/mL are generally considered                          toxic. Some overlap of toxic and non-toxic values have                          been reported.                                                      Detection Limit = 0.2 ng/mL  . Microalbum.,U,Random 05/14/2014 8.9  0.0 - 17.0 ug/mL Final  . TSH 05/14/2014 4.640* 0.450 - 4.500 uIU/mL Final     Assessment/Plan  1. Seborrheic keratosis unchanged - desoximetasone (TOPICORT) 0.25 % cream; Apply to lesion daily  Dispense: 60 g; Refill: 2  2. Type 2 diabetes mellitus, uncontrolled, with renal complications Follow diet. No change in medication - Hemoglobin A1c; Future - Comprehensive metabolic panel; Future  3. Essential hypertension controlled - Comprehensive metabolic panel; Future  4. Hypothyroidism, unspecified hypothyroidism type -  TSH; Future  5. Chronic kidney disease, stage III (moderate) stable  6. Edema Stop HCTZ - furosemide (LASIX) 40 MG tablet; One each morning to control edema  Dispense: 30 tablet; Refill: 3  7. URTI (acute upper respiratory infection) - chlorpheniramine-HYDROcodone (TUSSIONEX PENNKINETIC ER) 10-8 MG/5ML LQCR; 5 cc every 12 hours to control cough  Dispense: 120 mL; Refill: 0  8. Obstructive sleep apnea Schedule home sleep apne evaluation  9. Hyperlipemia - Lipid panel; Future

## 2014-05-21 ENCOUNTER — Telehealth: Payer: Self-pay | Admitting: *Deleted

## 2014-05-21 NOTE — Telephone Encounter (Signed)
Amy with [facility] called and stated that patient found her old steroid cream and it was Lidex .05% 60gm. Patient would rather have that instead. Please Advise.

## 2014-05-22 NOTE — Telephone Encounter (Signed)
It is okay to make this substitution. Please correct her medication list.

## 2014-05-23 NOTE — Telephone Encounter (Signed)
Left message on voicemail for pharmacy, informing them of Dr.[facility] response. Added medication to med list

## 2014-06-03 ENCOUNTER — Ambulatory Visit (INDEPENDENT_AMBULATORY_CARE_PROVIDER_SITE_OTHER): Payer: Medicare Other | Admitting: *Deleted

## 2014-06-03 ENCOUNTER — Ambulatory Visit: Payer: Medicare Other

## 2014-06-03 DIAGNOSIS — Z23 Encounter for immunization: Secondary | ICD-10-CM

## 2014-06-09 ENCOUNTER — Encounter: Payer: Self-pay | Admitting: *Deleted

## 2014-06-17 ENCOUNTER — Other Ambulatory Visit: Payer: Self-pay | Admitting: *Deleted

## 2014-06-17 DIAGNOSIS — E559 Vitamin D deficiency, unspecified: Secondary | ICD-10-CM

## 2014-06-17 MED ORDER — HYDROCODONE-ACETAMINOPHEN 5-325 MG PO TABS: ORAL_TABLET | ORAL | Status: DC

## 2014-06-17 MED ORDER — ERGOCALCIFEROL 1.25 MG (50000 UT) PO CAPS: 50000.0000 [IU] | ORAL_CAPSULE | ORAL | Status: DC

## 2014-06-17 NOTE — Telephone Encounter (Signed)
[place]

## 2014-07-04 ENCOUNTER — Telehealth: Payer: Self-pay | Admitting: *Deleted

## 2014-07-04 NOTE — Telephone Encounter (Signed)
Filled out forms for patient to receive patient assistance from [employer] for [employer]. Faxed to [facility] Attn: Vicenta Dunning # [phone] Fax #: [phone]

## 2014-07-15 ENCOUNTER — Other Ambulatory Visit: Payer: Self-pay | Admitting: *Deleted

## 2014-07-15 DIAGNOSIS — E559 Vitamin D deficiency, unspecified: Secondary | ICD-10-CM

## 2014-07-15 MED ORDER — ERGOCALCIFEROL 1.25 MG (50000 UT) PO CAPS: 50000.0000 [IU] | ORAL_CAPSULE | ORAL | Status: DC

## 2014-07-15 MED ORDER — HYDROCODONE-ACETAMINOPHEN 5-325 MG PO TABS: ORAL_TABLET | ORAL | Status: DC

## 2014-07-15 MED ORDER — OMEPRAZOLE 40 MG PO CPDR: DELAYED_RELEASE_CAPSULE | ORAL | Status: DC

## 2014-07-15 NOTE — Telephone Encounter (Signed)
[place]

## 2014-07-16 ENCOUNTER — Encounter: Payer: Self-pay | Admitting: Internal Medicine

## 2014-07-16 ENCOUNTER — Ambulatory Visit (INDEPENDENT_AMBULATORY_CARE_PROVIDER_SITE_OTHER): Payer: Medicare Other | Admitting: Internal Medicine

## 2014-07-16 ENCOUNTER — Ambulatory Visit
Admission: RE | Admit: 2014-07-16 | Discharge: 2014-07-16 | Disposition: A | Discharge status: Home / Self Care | Payer: Medicare Other | Source: Ambulatory Visit | Attending: Internal Medicine | Admitting: Internal Medicine

## 2014-07-16 VITALS — BP 142/80 | HR 95 | Temp 96.8°F | Resp 10 | Ht 64.5 in | Wt 141.0 lb

## 2014-07-16 DIAGNOSIS — M159 Polyosteoarthritis, unspecified: Secondary | ICD-10-CM

## 2014-07-16 DIAGNOSIS — M25511 Pain in right shoulder: Secondary | ICD-10-CM

## 2014-07-16 NOTE — Patient Instructions (Signed)
No heavy lifting, pushing or pulling until further notice.  Continue hydrocodone as needed for pain.

## 2014-07-16 NOTE — Progress Notes (Signed)
   Subjective:    Patient ID: Karen [name], female    DOB: May 01, 1924, 80 y.o.   MRN: [id number]  HPI  80 yo female presents today with right shoulder pain x 3 weeks. She fell 1 yr ago in the shower and injured left shoulder but xrays neg. No recent falls. No known trauma. Tried ice/heat/OTC creams without relief. Takes hydrocodone QID. Allergy to anti-inflammatories. (+) numbness in right hand. (+) weakness in right hand. Hx OA, spinal stenosis. She has rec'd joint injections in the past. Last A1c 8% in Oct 2015.   Review of Systems  Musculoskeletal: Positive for myalgias, back pain, joint swelling, arthralgias and neck pain.  Skin: Negative for rash.       Objective:   Physical Exam Filed Vitals:   07/16/14 1118  BP: 142/80  Pulse: 95  Temp: 96.8 F (36 C)  Resp: 10   CONSTITUTIONAL: Looks uncomfortable in NAD. Awake, alert and oriented x 3 MUSC: reduced ROM in all direction in right shoulder with anterior and posterior TTP, coracoid process TTP. No AC joint swelling. No rotator cuff hypertrophy. No bony deformity.  Shoulder grinds with ROM. No biceps tendon TTP. Right forearm TTP with supination. Reduced right grip strength. Distal pulses palpable. (+) Apley scratch test. Left shoulder FROM with min TTP.     Assessment & Plan:     ICD-9-CM ICD-10-CM   1. Shoulder pain, acute, right 719.41 M25.511 DG Shoulder Right  2. Generalized OA 715.00 M15.9    Orders Placed This Encounter  Procedures  . DG Shoulder Right    Standing Status: Future     Number of Occurrences:      Standing Expiration Date: 09/17/2015    Order Specific Question:  Reason for Exam (SYMPTOM  OR DIAGNOSIS REQUIRED)    Answer:  pain x 3 weeks    Order Specific Question:  Preferred imaging location?    Answer:  [id number]    Order Specific Question:  Call Report- Best Contact Number?    Answer:  [phone]   - Continue pain control with hydrocodone prn. No heavy lifting, pushing or  pulling until further notice.  - Will consider shoulder injection pending xray result  - May need Ortho eval

## 2014-07-18 ENCOUNTER — Ambulatory Visit (INDEPENDENT_AMBULATORY_CARE_PROVIDER_SITE_OTHER): Payer: Medicare Other | Admitting: Internal Medicine

## 2014-07-18 ENCOUNTER — Encounter: Payer: Self-pay | Admitting: Internal Medicine

## 2014-07-18 VITALS — BP 160/72 | HR 100 | Temp 97.8°F | Resp 20 | Ht 64.5 in | Wt 141.0 lb

## 2014-07-18 DIAGNOSIS — M19011 Primary osteoarthritis, right shoulder: Secondary | ICD-10-CM

## 2014-07-18 DIAGNOSIS — M1990 Unspecified osteoarthritis, unspecified site: Secondary | ICD-10-CM

## 2014-07-18 DIAGNOSIS — R21 Rash and other nonspecific skin eruption: Secondary | ICD-10-CM

## 2014-07-18 MED ORDER — TRIAMCINOLONE ACETONIDE 10 MG/ML IJ SUSP
10.0000 mg | Freq: Once | INTRAMUSCULAR | Status: AC
  Administered 2014-08-08: 10 mg via INTRAMUSCULAR

## 2014-07-18 NOTE — Progress Notes (Signed)
Patient ID: Karen [name], female   DOB: 01-May-1924, 80 y.o.   MRN: [id number]    Facility  [facility]    Place of Service:   OFFICE   Allergies  Allergen Reactions  . Celebrex [Celecoxib] Rash  . Doxycycline Rash  . Lyrica [Pregabalin] Rash  . Avandia [Rosiglitazone]   . Macrodantin [Nitrofurantoin Macrocrystal]   . Sulfa Antibiotics   . Vioxx [Rofecoxib]     Chief Complaint  Patient presents with  . Medical Management of Chronic Issues    joint injection    HPI:  80 yo female presents today for a joint injection. She has right shoulder pain. Xray of shoulder revealed moderate degenerative changes in [facility] and shoulder. She continues to have pain with adduction, flexion. No new numbness or tingling.    Incidentally notes a rash on her posterior right shoulder/acromial process area x 1 month that is itchy. No insect bites. Never saw blisters. No pain.  She states she is nervous about getting a shoulder injection   Medications: Patient's Medications  New Prescriptions   No medications on file  Previous Medications   ALPRAZOLAM (XANAX) 0.5 MG TABLET    Take one tablet by mouth up to 3 times daily as needed for anxiety or sleep   ASPIRIN EC 81 MG TABLET    One daily to help prevent stroke and heart attack   BLOOD GLUCOSE MONITORING SUPPL (BAYER BREEZE 2 SYSTEM) W/DEVICE KIT       CHLORPHENIRAMINE-HYDROCODONE (TUSSIONEX PENNKINETIC ER) 10-8 MG/5ML LQCR    5 cc every 12 hours to control cough   CRANBERRY PO    Take 300 mg by mouth daily.   DESOXIMETASONE (TOPICORT) 0.25 % CREAM    Apply to lesion daily   DIGOXIN (LANOXIN) 0.125 MG TABLET    Take one tablet by mouth on Mondays,Wednedsdays,Fridays and Saturdays   DIPHENHYDRAMINE-ZINC ACETATE (BENADRYL ITCH STOPPING) CREAM    Apply 1 application topically 3 (three) times daily as needed.   ERGOCALCIFEROL (VITAMIN D2) 50000 UNITS CAPSULE    Take 1 capsule (50,000 Units total) by mouth once a week.   FLUOCINONIDE CREAM (LIDEX) 0.05 %     Apply 1 application topically 2 (two) times daily.   FUROSEMIDE (LASIX) 40 MG TABLET    One each morning to control edema   GLUCOSE BLOOD (BAYER BREEZE 2 TEST VI)    Check blood sugar 3 x daily as directed. DX:250.02   HYDROCHLOROTHIAZIDE (HYDRODIURIL) 25 MG TABLET    Take 1 tablet (25 mg total) by mouth daily.   HYDROCODONE-ACETAMINOPHEN (NORCO/VICODIN) 5-325 MG PER TABLET    Take one tablet by mouth four times daily as needed for pain   INSULIN ASPART (NOVOLOG FLEXPEN) 100 UNIT/ML FLEXPEN    Inject into the skin 3 (three) times daily with meals. 10 units in am, 12 units @ lunch, 14 units @ dinner   INSULIN GLARGINE (LANTUS SOLOSTAR) 100 UNIT/ML SOLOSTAR PEN    Inject 40 units at bed to control diabetes   INSULIN PEN NEEDLE 29G X 12.7MM MISC    Use as directed to administer insulin. DX: 250.40   INSULIN SYRINGE-NEEDLE U-100 (EASY COMFORT INSULIN SYRINGE) 30G X 1/2" 0.5 ML MISC    Use as Directed with insulin injections. Dx: 250.00   LANCET DEVICES (LANCING DEVICE) MISC       LEVOTHYROXINE (SYNTHROID, LEVOTHROID) 100 MCG TABLET    Take one tablet by mouth 30 minutes before breakfast for thyroid supplement   OMEPRAZOLE (PRILOSEC) 40  MG CAPSULE    Take one capsule by mouth twice daily   ONDANSETRON (ZOFRAN) 4 MG TABLET    Take 1 tablet (4 mg total) by mouth every 4 (four) hours as needed for nausea or vomiting.   TIZANIDINE (ZANAFLEX) 4 MG CAPSULE    Take one tablet by mouth three times daily as needed to relax muscles.   TRIMETHOPRIM (TRIMPEX) 100 MG TABLET    Take one tablet by mouth every night at bedtime to prevent bladder infection   VITAMIN E PO    Take 400 Units by mouth daily.  Modified Medications   No medications on file  Discontinued Medications   No medications on file     Review of Systems   As above. All other systems reviewed are negative   Filed Vitals:   07/18/14 1417  BP: 160/72  Pulse: 100  Temp: 97.8 F (36.6 C)  TempSrc: Oral  Resp: 20  Height: 5' 4.5" (1.638  m)  Weight: 141 lb (63.957 kg)  SpO2: 95%   Body mass index is 23.84 kg/(m^2).  Physical Exam  GEN: looks uncomfortable in NAD MUSC: reduced flexion, adduction and internal rotation of right shoulder; right AC joint swelling with TTP; no biceps tendon TTP; distal pulses palpable SKIN: right acromial process red rash without blisters, healing; no secondary signs of infection  IMAGING:   RIGHT SHOULDER - 2+ VIEW  COMPARISON: Chest radiograph 08/08/2013  FINDINGS: Moderate osteoarthritis involving the acromioclavicular and glenohumeral joints. No acute fracture or dislocation. Normal right hemi thorax for age.  IMPRESSION: Degenerative change, without acute osseous finding.   Electronically Signed  By: Abigail Miyamoto M.D.  On: 07/16/2014 13:43   Assessment/Plan  Osteoarthritis of right acromioclavicular joint  Joint Injection   After verbal consent obtained, right AC joint prepped and draped in a sterile fashion. Ethyl Chloride spray used as a local anesthetic. 1 mL Lidocaine 2% without epinephrine mixed with $RemoveBe'20mg'Pzsvaisqi$  Kenalog in 3 mL syringe and injected into joint without complication. No blood loss. Pt tolerated procedure well.   Osteoarthritis of right shoulder, unspecified osteoarthritis type  Continue current pain regimen. No heavy lifting or strenuous activity x 24 hrs after injection and then may resume nml activity. Apply cool compress prn to right AC joint.  May need Ortho eval if shoulder pain returns or does not improve  BP elevated due increased anxiety about pain in shoulder  F/u as scheduled

## 2014-07-18 NOTE — Patient Instructions (Signed)
Apply cool compress as needed to right shoulder 15 minutes on and 15 off.  No heavy lifting or strenuous activity x 24 hrs. May resume normal activity in the morning.  Apply cool compress to itchy rash. May use anti-itch cream.  Keep appointment with Dr Nyoka Cowden as scheduled

## 2014-07-28 ENCOUNTER — Telehealth: Payer: Self-pay | Admitting: *Deleted

## 2014-07-28 ENCOUNTER — Other Ambulatory Visit: Payer: Self-pay | Admitting: *Deleted

## 2014-07-28 MED ORDER — DIGOXIN 125 MCG PO TABS: ORAL_TABLET | ORAL | Status: DC

## 2014-07-28 NOTE — Telephone Encounter (Signed)
Amy from [facility] called regarding  medication for shingles on her shoulders and needing a prescription called in for the patient. I returned the called to the pharmacy with the order for valtrex  1 gm po TID x 7 days #21 no RF.

## 2014-07-29 ENCOUNTER — Ambulatory Visit (INDEPENDENT_AMBULATORY_CARE_PROVIDER_SITE_OTHER): Payer: Medicare Other | Admitting: Internal Medicine

## 2014-07-29 ENCOUNTER — Encounter: Payer: Self-pay | Admitting: Internal Medicine

## 2014-07-29 VITALS — BP 140/64 | HR 92 | Temp 98.7°F | Resp 10 | Wt 138.0 lb

## 2014-07-29 DIAGNOSIS — R059 Cough, unspecified: Secondary | ICD-10-CM

## 2014-07-29 DIAGNOSIS — R05 Cough: Secondary | ICD-10-CM

## 2014-07-29 DIAGNOSIS — B029 Zoster without complications: Secondary | ICD-10-CM

## 2014-07-29 DIAGNOSIS — B0229 Other postherpetic nervous system involvement: Secondary | ICD-10-CM

## 2014-07-29 DIAGNOSIS — J069 Acute upper respiratory infection, unspecified: Secondary | ICD-10-CM

## 2014-07-29 DIAGNOSIS — B0223 Postherpetic polyneuropathy: Secondary | ICD-10-CM

## 2014-07-29 MED ORDER — PREDNISONE 20 MG PO TABS: 20.0000 mg | ORAL_TABLET | Freq: Every day | ORAL | Status: DC

## 2014-07-29 MED ORDER — FLUCONAZOLE 150 MG PO TABS: 150.0000 mg | ORAL_TABLET | Freq: Once | ORAL | Status: DC

## 2014-07-29 MED ORDER — HYDROCOD POLST-CHLORPHEN POLST 10-8 MG/5ML PO LQCR: ORAL | Status: DC

## 2014-07-29 MED ORDER — AMOXICILLIN-POT CLAVULANATE 875-125 MG PO TABS: 1.0000 | ORAL_TABLET | Freq: Two times a day (BID) | ORAL | Status: DC

## 2014-07-29 NOTE — Progress Notes (Signed)
Patient ID: Karen [name], female   DOB: 1924-04-26, 80 y.o.   MRN: [id number]    Chief Complaint  Patient presents with  . Acute Visit    Possible shingles, rash on right/left side of back. Patient also with neurological pain in area    . URI    Chest congestion, cough, sore throat, and temp 99-100 degrees. Allsymptoms since Sunday evening. If ABX to be given patient will need treatment for yeast infection    Allergies  Allergen Reactions  . Celebrex [Celecoxib] Rash  . Doxycycline Rash  . Lyrica [Pregabalin] Rash  . Avandia [Rosiglitazone]   . Macrodantin [Nitrofurantoin Macrocrystal]   . Sulfa Antibiotics   . Vioxx [Rofecoxib]    HPI 80 y/o female pt here for acute concerns. She has been having cough, runny nose, nasal congestion and sore throat for 3 days. She has low grade fever and feels tired. She is bringing out yellow phlegm and has headache as well. She also noticed a rash on her right shoulder a month back but for 3 days she is having pain in the rash area and has also noticed some lesion on left shoulder area. No pain on left side. She denies noticing any pus filled lesion or draining lesions on right or left shoulder.   ROS Positive for fever , denies chills Has trouble breathing with her head and nose feeling stuffed up Positive for headache Denies dizziness or focal weakness Denies nausea or vomiting or abdominal pain Denies urinary complaints Denies loose stool Denies rash elsewhere  Past Medical History  Diagnosis Date  . Anemia   . Stroke   . Irregular heartbeat   . Sleep apnea   . Arthritis     body, neck  . TIA (transient ischemic attack) 2012  . Bladder disorder     over active  . Cancer     skin, removed  . Hiatal hernia   . Benign hypertensive heart disease   . CHF (congestive heart failure)   . Vitamin B12 deficiency   . Gastroparesis   . Hyperlipidemia   . Anxiety   . A-fib   . Spinal stenosis   . GERD (gastroesophageal reflux  disease)   . Esophageal stricture   . Peripheral vascular disease, unspecified   . Nausea alone   . Diarrhea   . Abnormal involuntary movements(781.0)   . Inflammatory disease of breast   . Transient ischemic attack (TIA), and cerebral infarction without residual deficits(V12.54)   . Urinary tract infection, site not specified   . Benign hypertensive heart disease with congestive heart failure   . Congestive heart failure, unspecified   . Nonspecific (abnormal) findings on radiological and other examination of skull and head   . Unspecified pruritic disorder   . Inflamed seborrheic keratosis   . Seborrheic keratosis   . Other B-complex deficiencies   . Chronic kidney disease, stage III (moderate)   . Type II or unspecified type diabetes mellitus with renal manifestations, not stated as uncontrolled   . Postmenopausal atrophic vaginitis   . Palpitations   . Mixed incontinence urge and stress (female)(female)   . Contact dermatitis and other eczema, due to unspecified cause   . Unspecified vitamin D deficiency   . Corns and callosities   . Gastroparesis   . Other specified visual disturbances   . Spasm of muscle   . Edema   . Pain in joint, lower leg   . Other and unspecified hyperlipidemia   . Anxiety  state, unspecified   . Unspecified hereditary and idiopathic peripheral neuropathy   . Unspecified constipation   . Unspecified essential hypertension   . Unspecified hypothyroidism   . Reflux esophagitis   . Other functional disorder of bladder   . Osteoarthrosis, unspecified whether generalized or localized, unspecified site   . Cervicalgia   . Spinal stenosis, unspecified region other than cervical   . Lumbago   . Unspecified sleep apnea   . Other malaise and fatigue   . Stricture and stenosis of esophagus    Current Outpatient Prescriptions on File Prior to Visit  Medication Sig Dispense Refill  . ALPRAZolam (XANAX) 0.5 MG tablet Take one tablet by mouth up to 3 times  daily as needed for anxiety or sleep 90 tablet 5  . aspirin EC 81 MG tablet One daily to help prevent stroke and heart attack 100 tablet 5  . Blood Glucose Monitoring Suppl (BAYER BREEZE 2 SYSTEM) W/DEVICE KIT     . CRANBERRY PO Take 300 mg by mouth daily.    . digoxin (LANOXIN) 0.125 MG tablet Take one tablet by mouth on Mondays,Wednedsdays,Fridays and Saturdays 30 tablet 5  . diphenhydrAMINE-zinc acetate (BENADRYL ITCH STOPPING) cream Apply 1 application topically 3 (three) times daily as needed.    . ergocalciferol (VITAMIN D2) 50000 UNITS capsule Take 1 capsule (50,000 Units total) by mouth once a week. 4 capsule 0  . furosemide (LASIX) 40 MG tablet One each morning to control edema 30 tablet 3  . Glucose Blood (BAYER BREEZE 2 TEST VI) Check blood sugar 3 x daily as directed. DX:250.02    . hydrochlorothiazide (HYDRODIURIL) 25 MG tablet Take 1 tablet (25 mg total) by mouth daily. 30 tablet 3  . HYDROcodone-acetaminophen (NORCO/VICODIN) 5-325 MG per tablet Take one tablet by mouth four times daily as needed for pain 120 tablet 0  . insulin aspart (NOVOLOG FLEXPEN) 100 UNIT/ML FlexPen Inject into the skin 3 (three) times daily with meals. 10 units in am, 12 units @ lunch, 14 units @ dinner    . Insulin Glargine (LANTUS SOLOSTAR) 100 UNIT/ML Solostar Pen Inject 40 units at bed to control diabetes 5 pen 5  . Insulin Pen Needle 29G X 12.7MM MISC Use as directed to administer insulin. DX: 250.40 100 each 5  . Insulin Syringe-Needle U-100 (EASY COMFORT INSULIN SYRINGE) 30G X 1/2" 0.5 ML MISC Use as Directed with insulin injections. Dx: 250.00 100 each 11  . Lancet Devices (LANCING DEVICE) MISC     . levothyroxine (SYNTHROID, LEVOTHROID) 100 MCG tablet Take one tablet by mouth 30 minutes before breakfast for thyroid supplement 90 tablet 3  . omeprazole (PRILOSEC) 40 MG capsule Take one capsule by mouth twice daily 60 capsule 3  . ondansetron (ZOFRAN) 4 MG tablet Take 1 tablet (4 mg total) by mouth  every 4 (four) hours as needed for nausea or vomiting. 30 tablet 2  . tiZANidine (ZANAFLEX) 4 MG capsule Take one tablet by mouth three times daily as needed to relax muscles. 90 capsule 1  . trimethoprim (TRIMPEX) 100 MG tablet Take one tablet by mouth every night at bedtime to prevent bladder infection 30 tablet 6  . valACYclovir (VALTREX) 1000 MG tablet Take 1,000 mg by mouth 3 (three) times daily.     Marland Kitchen VITAMIN E PO Take 400 Units by mouth daily.    Marland Kitchen desoximetasone (TOPICORT) 0.25 % cream Apply to lesion daily (Patient not taking: Reported on 07/29/2014) 60 g 2  . fluocinonide cream ([place])  2.09 % Apply 1 application topically 2 (two) times daily.     Current Facility-Administered Medications on File Prior to Visit  Medication Dose Route Frequency Provider Last Rate Last Dose  . triamcinolone acetonide (KENALOG) 10 MG/ML injection 10 mg  10 mg Intramuscular Once [employer], DO        Physical exam BP 140/64 mmHg  Pulse 92  Temp(Src) 98.7 F (37.1 C) (Oral)  Resp 10  Wt 138 lb (62.596 kg)  SpO2 97%  General- elderly female in no acute distress, ill appearing Head- atraumatic, normocephalic Eyes- no pallor, no icterus, no discharge Neck- left anterior cervical lymphadenopathy Nose- swollen and red nasal mucosa, positive maxillary sinus tenderness, no frontal sinus tenderness Mouth- normal mucus membrane, no oral thrush, oropharyngeal erythema present Cardiovascular- normal s1,s2 Respiratory- bilateral clear to auscultation, no wheeze, no rhonchi, no crackles Abdomen- bowel sounds present, soft, non tender Musculoskeletal- able to move all 4 extremities, no leg edema Neurological- no focal deficit Skin- warm and dry, has erythematous scattered papular lesion on right shoulder area with some clusters, no vesicles, no drainage, no crusting noted, on left shoulder is 2-3 red small macules Psychiatry- alert and oriented to person, place and time, normal mood and  affect  Assessment/plan  1. URTI (acute upper respiratory infection) Treat empirically with augmentin 875 mg bid for 10 days for URI/sinusitis. tussionex for cough, reassess if no improvement, hydration and rest encouraged - chlorpheniramine-HYDROcodone (TUSSIONEX PENNKINETIC ER) 10-8 MG/5ML LQCR; 5 cc every 12 hours to control cough  Dispense: 120 mL; Refill: 0  2. Shingles rash Valtrex was called into pharmacy yesterday but not started yet, start valtrex 1000 mg tid for a week with prednsione 20 mg daily x 5 days to help with the pain  3. Cough tussionex bid prn cough for now  4. Acute herpes zoster neuropathy A brief 5 days course of prednisone to  Help with neuropathic pain , if no improvement consider gabapentin or lyrica

## 2014-08-07 ENCOUNTER — Other Ambulatory Visit: Payer: Self-pay | Admitting: *Deleted

## 2014-08-07 DIAGNOSIS — E559 Vitamin D deficiency, unspecified: Secondary | ICD-10-CM

## 2014-08-07 MED ORDER — ERGOCALCIFEROL 1.25 MG (50000 UT) PO CAPS: 50000.0000 [IU] | ORAL_CAPSULE | ORAL | Status: DC

## 2014-08-07 MED ORDER — TRIMETHOPRIM 100 MG PO TABS: ORAL_TABLET | ORAL | Status: DC

## 2014-08-07 NOTE — Telephone Encounter (Signed)
[place]

## 2014-08-08 ENCOUNTER — Ambulatory Visit (INDEPENDENT_AMBULATORY_CARE_PROVIDER_SITE_OTHER): Payer: Medicare Other | Admitting: Internal Medicine

## 2014-08-08 ENCOUNTER — Encounter: Payer: Self-pay | Admitting: Internal Medicine

## 2014-08-08 ENCOUNTER — Ambulatory Visit
Admission: RE | Admit: 2014-08-08 | Discharge: 2014-08-08 | Disposition: A | Discharge status: Home / Self Care | Payer: Medicare Other | Source: Ambulatory Visit | Attending: Internal Medicine | Admitting: Internal Medicine

## 2014-08-08 VITALS — BP 158/70 | HR 96 | Temp 100.6°F | Resp 18 | Ht 64.5 in | Wt 136.6 lb

## 2014-08-08 DIAGNOSIS — R0602 Shortness of breath: Secondary | ICD-10-CM

## 2014-08-08 DIAGNOSIS — J189 Pneumonia, unspecified organism: Secondary | ICD-10-CM | POA: Diagnosis not present

## 2014-08-08 DIAGNOSIS — R062 Wheezing: Secondary | ICD-10-CM | POA: Diagnosis not present

## 2014-08-08 DIAGNOSIS — M1990 Unspecified osteoarthritis, unspecified site: Secondary | ICD-10-CM

## 2014-08-08 DIAGNOSIS — J069 Acute upper respiratory infection, unspecified: Secondary | ICD-10-CM | POA: Diagnosis not present

## 2014-08-08 DIAGNOSIS — M47814 Spondylosis without myelopathy or radiculopathy, thoracic region: Secondary | ICD-10-CM | POA: Diagnosis not present

## 2014-08-08 MED ORDER — HYDROCOD POLST-CHLORPHEN POLST 10-8 MG/5ML PO LQCR: ORAL | Status: DC

## 2014-08-08 MED ORDER — MOXIFLOXACIN HCL 400 MG PO TABS: 400.0000 mg | ORAL_TABLET | Freq: Every day | ORAL | Status: DC

## 2014-08-08 MED ORDER — ALBUTEROL SULFATE HFA 108 (90 BASE) MCG/ACT IN AERS: 2.0000 | INHALATION_SPRAY | Freq: Four times a day (QID) | RESPIRATORY_TRACT | Status: DC | PRN

## 2014-08-08 MED ORDER — ALBUTEROL SULFATE (2.5 MG/3ML) 0.083% IN NEBU: 2.5000 mg | INHALATION_SOLUTION | Freq: Once | RESPIRATORY_TRACT | Status: DC

## 2014-08-08 MED ORDER — HYDROCODONE-ACETAMINOPHEN 5-325 MG PO TABS
ORAL_TABLET | ORAL | Status: DC
Start: 2014-08-08 — End: 2014-09-08

## 2014-08-08 MED ORDER — ONDANSETRON HCL 4 MG PO TABS: 4.0000 mg | ORAL_TABLET | ORAL | Status: DC | PRN

## 2014-08-08 NOTE — Patient Instructions (Signed)
Push fluids and rest.  Rinse mouth after each use of inhaler.  Will call with xray results.  Follow up in 1 week.   Go to the Emergency room if you feel worse

## 2014-08-08 NOTE — Progress Notes (Signed)
Patient ID: Karen [name], female   DOB: 04/03/1924, 81 y.o.   MRN: [id number]    Facility  [facility]    Place of Service:   OFFICE   Allergies  Allergen Reactions  . Celebrex [Celecoxib] Rash  . Doxycycline Rash  . Lyrica [Pregabalin] Rash  . Avandia [Rosiglitazone]   . Macrodantin [Nitrofurantoin Macrocrystal]   . Sulfa Antibiotics   . Vioxx [Rofecoxib]     Chief Complaint  Patient presents with  . Acute Visit    cough x 1 month, weak spells x1 week     HPI:  81 yo female seen today for cough, weaknss, f/c, nausea with emesis.She was seen last week and tx for URI with 10 days of augmentin, tussionex and prednisone taper. She feels no better. C/o CP due to cough, increased SOB, night sweats.  Son is present Medications: Patient's Medications  New Prescriptions   No medications on file  Previous Medications   ALPRAZOLAM (XANAX) 0.5 MG TABLET    Take one tablet by mouth up to 3 times daily as needed for anxiety or sleep   AMOXICILLIN-CLAVULANATE (AUGMENTIN) 875-125 MG PER TABLET    Take 1 tablet by mouth 2 (two) times daily.   ASPIRIN EC 81 MG TABLET    One daily to help prevent stroke and heart attack   BLOOD GLUCOSE MONITORING SUPPL (BAYER BREEZE 2 SYSTEM) W/DEVICE KIT       CHLORPHENIRAMINE-HYDROCODONE (TUSSIONEX PENNKINETIC ER) 10-8 MG/5ML LQCR    5 cc every 12 hours to control cough   CRANBERRY PO    Take 300 mg by mouth daily.   DESOXIMETASONE (TOPICORT) 0.25 % CREAM    Apply to lesion daily   DIGOXIN (LANOXIN) 0.125 MG TABLET    Take one tablet by mouth on Mondays,Wednedsdays,Fridays and Saturdays   DIPHENHYDRAMINE-ZINC ACETATE (BENADRYL ITCH STOPPING) CREAM    Apply 1 application topically 3 (three) times daily as needed.   ERGOCALCIFEROL (VITAMIN D2) 50000 UNITS CAPSULE    Take 1 capsule (50,000 Units total) by mouth once a week.   FLUCONAZOLE (DIFLUCAN) 150 MG TABLET    Take 1 tablet (150 mg total) by mouth once.   FLUOCINONIDE CREAM (LIDEX) 0.05 %    Apply 1  application topically 2 (two) times daily.   FUROSEMIDE (LASIX) 40 MG TABLET    One each morning to control edema   GLUCOSE BLOOD (BAYER BREEZE 2 TEST VI)    Check blood sugar 3 x daily as directed. DX:250.02   HYDROCHLOROTHIAZIDE (HYDRODIURIL) 25 MG TABLET    Take 1 tablet (25 mg total) by mouth daily.   HYDROCODONE-ACETAMINOPHEN (NORCO/VICODIN) 5-325 MG PER TABLET    Take one tablet by mouth four times daily as needed for pain   INSULIN ASPART (NOVOLOG FLEXPEN) 100 UNIT/ML FLEXPEN    Inject into the skin 3 (three) times daily with meals. 10 units in am, 12 units @ lunch, 14 units @ dinner   INSULIN GLARGINE (LANTUS SOLOSTAR) 100 UNIT/ML SOLOSTAR PEN    Inject 40 units at bed to control diabetes   INSULIN PEN NEEDLE 29G X 12.7MM MISC    Use as directed to administer insulin. DX: 250.40   INSULIN SYRINGE-NEEDLE U-100 (EASY COMFORT INSULIN SYRINGE) 30G X 1/2" 0.5 ML MISC    Use as Directed with insulin injections. Dx: 250.00   LANCET DEVICES (LANCING DEVICE) MISC       LEVOTHYROXINE (SYNTHROID, LEVOTHROID) 100 MCG TABLET    Take one tablet by mouth 30 minutes  before breakfast for thyroid supplement   OMEPRAZOLE (PRILOSEC) 40 MG CAPSULE    Take one capsule by mouth twice daily   ONDANSETRON (ZOFRAN) 4 MG TABLET    Take 1 tablet (4 mg total) by mouth every 4 (four) hours as needed for nausea or vomiting.   PREDNISONE (DELTASONE) 20 MG TABLET    Take 1 tablet (20 mg total) by mouth daily with breakfast.   TIZANIDINE (ZANAFLEX) 4 MG CAPSULE    Take one tablet by mouth three times daily as needed to relax muscles.   TRIMETHOPRIM (TRIMPEX) 100 MG TABLET    Take one tablet by mouth every night at bedtime to prevent bladder infection   VALACYCLOVIR (VALTREX) 1000 MG TABLET    Take 1,000 mg by mouth 3 (three) times daily.    VITAMIN E PO    Take 400 Units by mouth daily.  Modified Medications   No medications on file  Discontinued Medications   No medications on file     Review of Systems    Constitutional: Positive for fever, chills, activity change, appetite change and fatigue.  HENT: Positive for trouble swallowing. Negative for ear pain, hearing loss and mouth sores.   Eyes: Negative for visual disturbance.  Respiratory: Positive for cough, choking, chest tightness, shortness of breath and wheezing.   Cardiovascular: Positive for chest pain and palpitations.  Gastrointestinal: Positive for nausea, vomiting and abdominal pain.  Neurological: Positive for dizziness, tremors (worse) and weakness. Negative for numbness.    Filed Vitals:   08/08/14 1152  BP: 158/70  Pulse: 96  Temp: 100.6 F (38.1 C)  TempSrc: Oral  Resp: 18  Height: 5' 4.5" (1.638 m)  Weight: 136 lb 9.6 oz (61.961 kg)  SpO2: 95%   Body mass index is 23.09 kg/(m^2).  Physical Exam  CONSTITUTIONAL: Looks ill with min conversational dyspnea. Awake, alert and oriented x 3 HEENT: PERRLA. Oropharynx clear and without exudate. MMM NECK: Supple. Nontender. No palpable cervical or supraclavicular lymph nodes. No stridor. No tracheal deviation CVS: Irregular irregular with 1/6 SEmurmur. No gallop or rub. LUNGS: (+) end expiratory wheezing with prolonged expiratory phase. No rales or rhonchi. EXTREMITIES: No edema b/l. Distal pulses palpable.   Labs reviewed: Appointment on 05/14/2014  Component Date Value Ref Range Status  . Hgb A1c MFr Bld 05/14/2014 8.0* 4.8 - 5.6 % Final   Comment:          Increased risk for diabetes: 5.7 - 6.4                                   Diabetes: >6.4                                   Glycemic control for adults with diabetes: <7.0  . Est. average glucose Bld gHb Est-m* 05/14/2014 183   Final  . Glucose 05/14/2014 98  65 - 99 mg/dL Final  . BUN 05/14/2014 19  8 - 27 mg/dL Final  . Creatinine, Ser 05/14/2014 1.34* 0.57 - 1.00 mg/dL Final  . GFR calc non Af Amer 05/14/2014 35* >59 mL/min/1.73 Final  . GFR calc Af Amer 05/14/2014 41* >59 mL/min/1.73 Final  . BUN/Creatinine  Ratio 05/14/2014 14  11 - 26 Final  . Sodium 05/14/2014 137  134 - 144 mmol/L Final  . Potassium 05/14/2014 4.5  3.5 -  5.2 mmol/L Final  . Chloride 05/14/2014 96* 97 - 108 mmol/L Final  . CO2 05/14/2014 27  18 - 29 mmol/L Final  . Calcium 05/14/2014 9.3  8.7 - 10.3 mg/dL Final  . Digoxin Level 05/14/2014 1.0  0.9 - 2.0 ng/mL Final   Comment: Concentrations above 2.0 ng/mL are generally considered                          toxic. Some overlap of toxic and non-toxic values have                          been reported.                                                      Detection Limit = 0.2 ng/mL  . Microalbum.,U,Random 05/14/2014 8.9  0.0 - 17.0 ug/mL Final  . TSH 05/14/2014 4.640* 0.450 - 4.500 uIU/mL Final     Assessment/Plan    ICD-9-CM ICD-10-CM   1. Pneumonia, organism unspecified 486 J18.9 DG Chest 2 View     albuterol (PROVENTIL HFA;VENTOLIN HFA) 108 (90 BASE) MCG/ACT inhaler     albuterol (PROVENTIL) (2.5 MG/3ML) 0.083% nebulizer solution     CBC with Differential     Basic Metabolic Panel  2. Shortness of breath 786.05 R06.02   3. Wheezing 786.07 R06.2   4. URTI (acute upper respiratory infection) 465.9 J06.9 chlorpheniramine-HYDROcodone (TUSSIONEX PENNKINETIC ER) 10-8 MG/5ML LQCR    - will determine abx once CXR resulted. She has already completed a course of augmentin. May benefit from quinolone or macrolide tx. She takes dig for irregular heartbeat and will need to take this into acct when choosing tx  - Rx written for HFA to use prn  - Rx for Tussigon written  - RTO in1 week for re-eval. Instructed to go to the ER if sx's worsen or fail to improve.   Monica S. Perlie Gold  [facility] [address] [place], [place] [address] [phone] Office (Wednesdays and Fridays 8 AM - 5 PM) [phone] Cell (Monday-Friday 8 AM - 5 PM)

## 2014-08-09 LAB — BASIC METABOLIC PANEL
BUN/Creatinine Ratio: 20 (ref 11–26)
BUN: 32 mg/dL — ABNORMAL HIGH (ref 8–27)
CHLORIDE: 94 mmol/L — AB (ref 97–108)
CO2: 28 mmol/L (ref 18–29)
Calcium: 9 mg/dL (ref 8.7–10.3)
Creatinine, Ser: 1.58 mg/dL — ABNORMAL HIGH (ref 0.57–1.00)
GFR calc non Af Amer: 29 mL/min/{1.73_m2} — ABNORMAL LOW (ref >59)
GFR, EST AFRICAN AMERICAN: 33 mL/min/{1.73_m2} — AB (ref >59)
Glucose: 164 mg/dL — ABNORMAL HIGH (ref 65–99)
POTASSIUM: 4.3 mmol/L (ref 3.5–5.2)
Sodium: 138 mmol/L (ref 134–144)

## 2014-08-09 LAB — CBC WITH DIFFERENTIAL/PLATELET
BASOS ABS: 0 10*3/uL (ref 0.0–0.2)
BASOS: 0 %
Eos: 1 %
Eosinophils Absolute: 0.1 10*3/uL (ref 0.0–0.4)
HEMATOCRIT: 34.8 % (ref 34.0–46.6)
HEMOGLOBIN: 11.6 g/dL (ref 11.1–15.9)
IMMATURE GRANULOCYTES: 0 %
Immature Grans (Abs): 0 10*3/uL (ref 0.0–0.1)
LYMPHS ABS: 2.7 10*3/uL (ref 0.7–3.1)
LYMPHS: 20 %
MCH: 30.8 pg (ref 26.6–33.0)
MCHC: 33.3 g/dL (ref 31.5–35.7)
MCV: 92 fL (ref 79–97)
Monocytes Absolute: 0.7 10*3/uL (ref 0.1–0.9)
Monocytes: 5 %
NEUTROS PCT: 74 %
Neutrophils Absolute: 9.9 10*3/uL — ABNORMAL HIGH (ref 1.4–7.0)
RBC: 3.77 x10E6/uL (ref 3.77–5.28)
RDW: 13.8 % (ref 12.3–15.4)
WBC: 13.5 10*3/uL — AB (ref 3.4–10.8)

## 2014-08-12 ENCOUNTER — Telehealth: Payer: Self-pay

## 2014-08-12 NOTE — Telephone Encounter (Signed)
Order of Necessity was received from [facility] for compound pain cream. Form was placed in holding folder for pending appointment with Dr.Carter on 08/18/2014 (Friday), form will be addressed and faxed back following appointment

## 2014-08-15 ENCOUNTER — Ambulatory Visit (INDEPENDENT_AMBULATORY_CARE_PROVIDER_SITE_OTHER): Payer: Medicare Other | Admitting: Internal Medicine

## 2014-08-15 ENCOUNTER — Telehealth: Payer: Self-pay | Admitting: *Deleted

## 2014-08-15 ENCOUNTER — Encounter: Payer: Self-pay | Admitting: Internal Medicine

## 2014-08-15 VITALS — BP 158/76 | HR 86 | Temp 97.6°F | Resp 10 | Ht 64.5 in | Wt 132.0 lb

## 2014-08-15 DIAGNOSIS — E860 Dehydration: Secondary | ICD-10-CM

## 2014-08-15 DIAGNOSIS — J189 Pneumonia, unspecified organism: Secondary | ICD-10-CM

## 2014-08-15 MED ORDER — FLUCONAZOLE 150 MG PO TABS: 150.0000 mg | ORAL_TABLET | Freq: Once | ORAL | Status: DC

## 2014-08-15 MED ORDER — MOXIFLOXACIN HCL 400 MG PO TABS: 400.0000 mg | ORAL_TABLET | Freq: Every day | ORAL | Status: DC

## 2014-08-15 NOTE — Patient Instructions (Addendum)
Continue to push fluids  Take additional 3 days of Avelox  Use inhaler as prescribed and rinse mouth after each use  Get plenty of rest

## 2014-08-15 NOTE — Progress Notes (Signed)
Patient ID: Karen [name], female   DOB: [birth year], 81 y.o.   MRN: [id number]    Facility  [facility]    Place of Service:   OFFICE   Allergies  Allergen Reactions  . Celebrex [Celecoxib] Rash  . Doxycycline Rash  . Lyrica [Pregabalin] Rash  . Avandia [Rosiglitazone]   . Macrodantin [Nitrofurantoin Macrocrystal]   . Sulfa Antibiotics   . Vioxx [Rofecoxib]     Chief Complaint  Patient presents with  . Follow-up    1 week follow-up on URI symptoms, patient states "Im a little better" cough is better (still present), off balace and fatigue (possibly related to medications)    HPI:  81 yo female seen for f/u on pneumonia. She still c/o fatigue and SOB but admits that she did not know how to use the [facility] correctly until yesterday when her son showed her again. She slept all night last night. Weakness improved a little. Appetite still reduced. She has dysphagia and a sensation that food get stuck but this is chronic. Tremors are better  Her son is present  Medications: Patient's Medications  New Prescriptions   No medications on file  Previous Medications   ALBUTEROL (PROVENTIL HFA;VENTOLIN HFA) 108 (90 BASE) MCG/ACT INHALER    Inhale 2 puffs into the lungs every 6 (six) hours as needed for wheezing or shortness of breath.   ALBUTEROL (PROVENTIL) (2.5 MG/3ML) 0.083% NEBULIZER SOLUTION    Take 3 mLs (2.5 mg total) by nebulization once.   ALPRAZOLAM (XANAX) 0.5 MG TABLET    Take one tablet by mouth up to 3 times daily as needed for anxiety or sleep   ASPIRIN EC 81 MG TABLET    One daily to help prevent stroke and heart attack   BLOOD GLUCOSE MONITORING SUPPL (BAYER BREEZE 2 SYSTEM) W/DEVICE KIT       CHLORPHENIRAMINE-HYDROCODONE (TUSSIONEX PENNKINETIC ER) 10-8 MG/5ML LQCR    5 cc every 12 hours to control cough   CRANBERRY PO    Take 300 mg by mouth daily.   DESOXIMETASONE (TOPICORT) 0.25 % CREAM    Apply to lesion daily   DIGOXIN (LANOXIN) 0.125 MG TABLET    Take one tablet by  mouth on Mondays,Wednedsdays,Fridays and Saturdays   DIPHENHYDRAMINE-ZINC ACETATE (BENADRYL ITCH STOPPING) CREAM    Apply 1 application topically 3 (three) times daily as needed.   ERGOCALCIFEROL (VITAMIN D2) 50000 UNITS CAPSULE    Take 1 capsule (50,000 Units total) by mouth once a week.   FLUCONAZOLE (DIFLUCAN) 150 MG TABLET    Take 1 tablet (150 mg total) by mouth once.   FLUOCINONIDE CREAM (LIDEX) 0.05 %    Apply 1 application topically 2 (two) times daily.   FUROSEMIDE (LASIX) 40 MG TABLET    One each morning to control edema   GLUCOSE BLOOD (BAYER BREEZE 2 TEST VI)    Check blood sugar 3 x daily as directed. DX:250.02   HYDROCHLOROTHIAZIDE (HYDRODIURIL) 25 MG TABLET    Take 1 tablet (25 mg total) by mouth daily.   HYDROCODONE-ACETAMINOPHEN (NORCO/VICODIN) 5-325 MG PER TABLET    Take one tablet by mouth four times daily as needed for pain   INSULIN ASPART (NOVOLOG FLEXPEN) 100 UNIT/ML FLEXPEN    Inject into the skin 3 (three) times daily with meals. 10 units in am, 12 units @ lunch, 14 units @ dinner   INSULIN GLARGINE (LANTUS SOLOSTAR) 100 UNIT/ML SOLOSTAR PEN    Inject 40 units at bed to control diabetes  INSULIN PEN NEEDLE 29G X 12.7MM MISC    Use as directed to administer insulin. DX: 250.40   INSULIN SYRINGE-NEEDLE U-100 (EASY COMFORT INSULIN SYRINGE) 30G X 1/2" 0.5 ML MISC    Use as Directed with insulin injections. Dx: 250.00   LANCET DEVICES (LANCING DEVICE) MISC       LEVOTHYROXINE (SYNTHROID, LEVOTHROID) 100 MCG TABLET    Take one tablet by mouth 30 minutes before breakfast for thyroid supplement   OMEPRAZOLE (PRILOSEC) 40 MG CAPSULE    Take one capsule by mouth twice daily   ONDANSETRON (ZOFRAN) 4 MG TABLET    Take 1 tablet (4 mg total) by mouth every 4 (four) hours as needed for nausea or vomiting.   PREDNISONE (DELTASONE) 20 MG TABLET    Take 1 tablet (20 mg total) by mouth daily with breakfast.   TIZANIDINE (ZANAFLEX) 4 MG CAPSULE    Take one tablet by mouth three times daily as  needed to relax muscles.   TRIMETHOPRIM (TRIMPEX) 100 MG TABLET    Take one tablet by mouth every night at bedtime to prevent bladder infection   VITAMIN E PO    Take 400 Units by mouth daily.  Modified Medications   No medications on file  Discontinued Medications   AMOXICILLIN-CLAVULANATE (AUGMENTIN) 875-125 MG PER TABLET    Take 1 tablet by mouth 2 (two) times daily.   MOXIFLOXACIN (AVELOX) 400 MG TABLET    Take 1 tablet (400 mg total) by mouth daily.   VALACYCLOVIR (VALTREX) 1000 MG TABLET    Take 1,000 mg by mouth 3 (three) times daily.      Review of Systems  As above. All other systems reviewed are negative  Filed Vitals:   08/15/14 0933  BP: 158/76  Pulse: 86  Temp: 97.6 F (36.4 C)  TempSrc: Oral  Resp: 10  Height: 5' 4.5" (1.638 m)  Weight: 132 lb (59.875 kg)  SpO2: 95%   Body mass index is 22.32 kg/(m^2).  Physical Exam  CONSTITUTIONAL: Looks ill in NAD. Awake, alert and oriented x 3 HEENT: PERRLA. No scleral icterus. Oropharynx clear and without exudate. MMM NECK: Supple. Nontender. No palpable cervical or supraclavicular lymph nodes.  CVS: irregular irregular with 1/6 SEmurmur. No gallop or rub. LUNGS: reduced BS at base b/l but  no wheezing, rales or rhonchi. Prolonged expiratory phase EXTREMITIES: No edema b/l.  PSYCH: Affect, behavior and mood normal  Labs reviewed: Office Visit on 08/08/2014  Component Date Value Ref Range Status  . WBC 08/08/2014 13.5* 3.4 - 10.8 x10E3/uL Final  . RBC 08/08/2014 3.77  3.77 - 5.28 x10E6/uL Final  . Hemoglobin 08/08/2014 11.6  11.1 - 15.9 g/dL Final  . HCT 08/08/2014 34.8  34.0 - 46.6 % Final  . MCV 08/08/2014 92  79 - 97 fL Final  . MCH 08/08/2014 30.8  26.6 - 33.0 pg Final  . MCHC 08/08/2014 33.3  31.5 - 35.7 g/dL Final  . RDW 08/08/2014 13.8  12.3 - 15.4 % Final  . Neutrophils Relative % 08/08/2014 74   Final  . Lymphs 08/08/2014 20   Final  . Monocytes 08/08/2014 5   Final  . Eos 08/08/2014 1   Final  . Basos  08/08/2014 0   Final  . Neutrophils Absolute 08/08/2014 9.9* 1.4 - 7.0 x10E3/uL Final  . Lymphocytes Absolute 08/08/2014 2.7  0.7 - 3.1 x10E3/uL Final  . Monocytes Absolute 08/08/2014 0.7  0.1 - 0.9 x10E3/uL Final  . Eosinophils Absolute 08/08/2014 0.1  0.0 - 0.4 x10E3/uL Final  . Basophils Absolute 08/08/2014 0.0  0.0 - 0.2 x10E3/uL Final  . Immature Granulocytes 08/08/2014 0   Final  . Immature Grans (Abs) 08/08/2014 0.0  0.0 - 0.1 x10E3/uL Final  . Glucose 08/08/2014 164* 65 - 99 mg/dL Final  . BUN 08/08/2014 32* 8 - 27 mg/dL Final  . Creatinine, Ser 08/08/2014 1.58* 0.57 - 1.00 mg/dL Final  . GFR calc non Af Amer 08/08/2014 29* >59 mL/min/1.73 Final  . GFR calc Af Amer 08/08/2014 33* >59 mL/min/1.73 Final  . BUN/Creatinine Ratio 08/08/2014 20  11 - 26 Final  . Sodium 08/08/2014 138  134 - 144 mmol/L Final  . Potassium 08/08/2014 4.3  3.5 - 5.2 mmol/L Final  . Chloride 08/08/2014 94* 97 - 108 mmol/L Final  . CO2 08/08/2014 28  18 - 29 mmol/L Final  . Calcium 08/08/2014 9.0  8.7 - 10.3 mg/dL Final     Assessment/Plan    ICD-9-CM ICD-10-CM   1. Pneumonia, organism unspecified- improving 486 J18.9 CBC with Differential     moxifloxacin (AVELOX) 400 MG tablet     fluconazole (DIFLUCAN) 150 MG tablet  2. Dehydration- improving 276.51 G90.2 Basic Metabolic Panel   --continue to push fluids and rest  --Rx 3 additional days of abx. Continue tussionex prn and HFA as rx  --f/u with Dr Nyoka Cowden as scheduled. RTO if sx's do not improve or worsen. Explained to pt that it may take several days for sx's to resolve completely.   Deroy Noah S. Perlie Gold  [facility] [address] [place], [place] [address] [phone] Office (Wednesdays and Fridays 8 AM - 5 PM) [phone] Cell (Monday-Friday 8 AM - 5 PM)

## 2014-08-15 NOTE — Telephone Encounter (Signed)
Patient son called and stated that patient uses [facility] to get her diabetes supplies.  We had multiple pharmacy faxing to get rx and son verified which pharmacy.

## 2014-08-16 LAB — CBC WITH DIFFERENTIAL/PLATELET
BASOS ABS: 0 10*3/uL (ref 0.0–0.2)
Basos: 0 %
EOS ABS: 0.1 10*3/uL (ref 0.0–0.4)
Eos: 1 %
HCT: 36.5 % (ref 34.0–46.6)
HEMOGLOBIN: 11.7 g/dL (ref 11.1–15.9)
IMMATURE GRANS (ABS): 0 10*3/uL (ref 0.0–0.1)
Immature Granulocytes: 0 %
LYMPHS ABS: 2.6 10*3/uL (ref 0.7–3.1)
Lymphs: 25 %
MCH: 30.4 pg (ref 26.6–33.0)
MCHC: 32.1 g/dL (ref 31.5–35.7)
MCV: 95 fL (ref 79–97)
Monocytes Absolute: 1 10*3/uL — ABNORMAL HIGH (ref 0.1–0.9)
Monocytes: 9 %
Neutrophils Absolute: 6.9 10*3/uL (ref 1.4–7.0)
Neutrophils Relative %: 65 %
RBC: 3.85 x10E6/uL (ref 3.77–5.28)
RDW: 14 % (ref 12.3–15.4)
WBC: 10.6 10*3/uL (ref 3.4–10.8)

## 2014-08-16 LAB — BASIC METABOLIC PANEL
BUN / CREAT RATIO: 15 (ref 11–26)
BUN: 25 mg/dL (ref 8–27)
CO2: 28 mmol/L (ref 18–29)
Calcium: 9 mg/dL (ref 8.7–10.3)
Chloride: 94 mmol/L — ABNORMAL LOW (ref 97–108)
Creatinine, Ser: 1.69 mg/dL — ABNORMAL HIGH (ref 0.57–1.00)
GFR calc non Af Amer: 27 mL/min/{1.73_m2} — ABNORMAL LOW (ref >59)
GFR, EST AFRICAN AMERICAN: 31 mL/min/{1.73_m2} — AB (ref >59)
GLUCOSE: 177 mg/dL — AB (ref 65–99)
POTASSIUM: 4.7 mmol/L (ref 3.5–5.2)
Sodium: 137 mmol/L (ref 134–144)

## 2014-08-25 ENCOUNTER — Other Ambulatory Visit: Payer: Self-pay | Admitting: *Deleted

## 2014-08-25 ENCOUNTER — Other Ambulatory Visit: Payer: Medicare Other

## 2014-08-25 DIAGNOSIS — F419 Anxiety disorder, unspecified: Secondary | ICD-10-CM

## 2014-08-25 DIAGNOSIS — D508 Other iron deficiency anemias: Secondary | ICD-10-CM | POA: Diagnosis not present

## 2014-08-25 MED ORDER — ALPRAZOLAM 0.5 MG PO TABS: ORAL_TABLET | ORAL | Status: DC

## 2014-08-25 NOTE — Telephone Encounter (Signed)
[place]

## 2014-08-26 LAB — BASIC METABOLIC PANEL
BUN / CREAT RATIO: 13 (ref 11–26)
BUN: 26 mg/dL (ref 8–27)
CHLORIDE: 95 mmol/L — AB (ref 97–108)
CO2: 28 mmol/L (ref 18–29)
Calcium: 9.1 mg/dL (ref 8.7–10.3)
Creatinine, Ser: 2.01 mg/dL — ABNORMAL HIGH (ref 0.57–1.00)
GFR calc Af Amer: 25 mL/min/{1.73_m2} — ABNORMAL LOW (ref >59)
GFR calc non Af Amer: 22 mL/min/{1.73_m2} — ABNORMAL LOW (ref >59)
GLUCOSE: 74 mg/dL (ref 65–99)
Potassium: 4.4 mmol/L (ref 3.5–5.2)
SODIUM: 142 mmol/L (ref 134–144)

## 2014-08-27 ENCOUNTER — Telehealth: Payer: Self-pay

## 2014-08-27 DIAGNOSIS — N289 Disorder of kidney and ureter, unspecified: Secondary | ICD-10-CM

## 2014-08-27 NOTE — Telephone Encounter (Signed)
Discussed results with patient, patient verbalized understanding of results. Scheduled appointment for Friday to recheck BMP

## 2014-08-27 NOTE — Telephone Encounter (Signed)
-----   Message from [facility], [place] sent at 08/26/2014  3:42 PM EST ----- Kidney fxn worse- hold lasix (furosemide). Repeat BMP in 2 days. If no better, will need nephrology eval

## 2014-08-29 ENCOUNTER — Other Ambulatory Visit: Payer: Self-pay | Admitting: *Deleted

## 2014-08-29 ENCOUNTER — Other Ambulatory Visit: Payer: Medicare Other

## 2014-08-29 DIAGNOSIS — N289 Disorder of kidney and ureter, unspecified: Secondary | ICD-10-CM

## 2014-08-29 DIAGNOSIS — E1129 Type 2 diabetes mellitus with other diabetic kidney complication: Secondary | ICD-10-CM

## 2014-08-29 MED ORDER — INSULIN GLARGINE 100 UNIT/ML SOLOSTAR PEN: PEN_INJECTOR | SUBCUTANEOUS | Status: DC

## 2014-08-29 NOTE — Telephone Encounter (Signed)
[place] called and requested.

## 2014-08-30 LAB — BASIC METABOLIC PANEL
BUN/Creatinine Ratio: 14 (ref 11–26)
BUN: 22 mg/dL (ref 8–27)
CO2: 25 mmol/L (ref 18–29)
CREATININE: 1.6 mg/dL — AB (ref 0.57–1.00)
Calcium: 8.9 mg/dL (ref 8.7–10.3)
Chloride: 98 mmol/L (ref 97–108)
GFR calc Af Amer: 33 mL/min/{1.73_m2} — ABNORMAL LOW (ref >59)
GFR, EST NON AFRICAN AMERICAN: 29 mL/min/{1.73_m2} — AB (ref >59)
GLUCOSE: 107 mg/dL — AB (ref 65–99)
Potassium: 4.7 mmol/L (ref 3.5–5.2)
SODIUM: 142 mmol/L (ref 134–144)

## 2014-09-08 ENCOUNTER — Other Ambulatory Visit: Payer: Self-pay | Admitting: *Deleted

## 2014-09-08 DIAGNOSIS — J069 Acute upper respiratory infection, unspecified: Secondary | ICD-10-CM

## 2014-09-08 MED ORDER — HYDROCOD POLST-CHLORPHEN POLST 10-8 MG/5ML PO LQCR: ORAL | Status: DC

## 2014-09-08 MED ORDER — HYDROCODONE-ACETAMINOPHEN 5-325 MG PO TABS: ORAL_TABLET | ORAL | Status: DC

## 2014-09-08 NOTE — Telephone Encounter (Signed)
[place]

## 2014-09-16 ENCOUNTER — Other Ambulatory Visit: Payer: Self-pay | Admitting: *Deleted

## 2014-09-16 MED ORDER — TIZANIDINE HCL 4 MG PO CAPS: ORAL_CAPSULE | ORAL | Status: DC

## 2014-09-16 NOTE — Telephone Encounter (Signed)
[place]

## 2014-09-17 ENCOUNTER — Other Ambulatory Visit: Payer: Medicare Other

## 2014-09-17 DIAGNOSIS — E1165 Type 2 diabetes mellitus with hyperglycemia: Principal | ICD-10-CM

## 2014-09-17 DIAGNOSIS — E039 Hypothyroidism, unspecified: Secondary | ICD-10-CM | POA: Diagnosis not present

## 2014-09-17 DIAGNOSIS — IMO0002 Reserved for concepts with insufficient information to code with codable children: Secondary | ICD-10-CM

## 2014-09-17 DIAGNOSIS — E785 Hyperlipidemia, unspecified: Secondary | ICD-10-CM | POA: Diagnosis not present

## 2014-09-17 DIAGNOSIS — I10 Essential (primary) hypertension: Secondary | ICD-10-CM | POA: Diagnosis not present

## 2014-09-17 DIAGNOSIS — E1129 Type 2 diabetes mellitus with other diabetic kidney complication: Secondary | ICD-10-CM | POA: Diagnosis not present

## 2014-09-18 LAB — TSH: TSH: 3.11 u[IU]/mL (ref 0.450–4.500)

## 2014-09-18 LAB — COMPREHENSIVE METABOLIC PANEL
A/G RATIO: 1.8 (ref 1.1–2.5)
ALK PHOS: 85 IU/L (ref 39–117)
ALT: 17 IU/L (ref 0–32)
AST: 19 IU/L (ref 0–40)
Albumin: 3.9 g/dL (ref 3.5–4.7)
BUN / CREAT RATIO: 15 (ref 11–26)
BUN: 16 mg/dL (ref 8–27)
Bilirubin Total: 0.3 mg/dL (ref 0.0–1.2)
CO2: 25 mmol/L (ref 18–29)
CREATININE: 1.07 mg/dL — AB (ref 0.57–1.00)
Calcium: 8.9 mg/dL (ref 8.7–10.3)
Chloride: 102 mmol/L (ref 97–108)
GFR calc Af Amer: 54 mL/min/{1.73_m2} — ABNORMAL LOW (ref >59)
GFR, EST NON AFRICAN AMERICAN: 46 mL/min/{1.73_m2} — AB (ref >59)
Globulin, Total: 2.2 g/dL (ref 1.5–4.5)
Glucose: 116 mg/dL — ABNORMAL HIGH (ref 65–99)
POTASSIUM: 4.4 mmol/L (ref 3.5–5.2)
Sodium: 141 mmol/L (ref 134–144)
Total Protein: 6.1 g/dL (ref 6.0–8.5)

## 2014-09-18 LAB — LIPID PANEL
CHOL/HDL RATIO: 4 ratio (ref 0.0–4.4)
Cholesterol, Total: 174 mg/dL (ref 100–199)
HDL: 44 mg/dL (ref >39)
LDL Calculated: 105 mg/dL — ABNORMAL HIGH (ref 0–99)
Triglycerides: 127 mg/dL (ref 0–149)
VLDL Cholesterol Cal: 25 mg/dL (ref 5–40)

## 2014-09-18 LAB — HEMOGLOBIN A1C
Est. average glucose Bld gHb Est-mCnc: 154 mg/dL
Hgb A1c MFr Bld: 7 % — ABNORMAL HIGH (ref 4.8–5.6)

## 2014-09-23 ENCOUNTER — Encounter: Payer: Self-pay | Admitting: Internal Medicine

## 2014-09-23 ENCOUNTER — Ambulatory Visit (INDEPENDENT_AMBULATORY_CARE_PROVIDER_SITE_OTHER): Payer: Medicare Other | Admitting: Internal Medicine

## 2014-09-23 VITALS — BP 138/72 | HR 92 | Temp 98.4°F | Resp 12 | Ht 65.0 in | Wt 139.0 lb

## 2014-09-23 DIAGNOSIS — E559 Vitamin D deficiency, unspecified: Secondary | ICD-10-CM

## 2014-09-23 DIAGNOSIS — M25511 Pain in right shoulder: Secondary | ICD-10-CM

## 2014-09-23 DIAGNOSIS — I739 Peripheral vascular disease, unspecified: Secondary | ICD-10-CM | POA: Diagnosis not present

## 2014-09-23 DIAGNOSIS — R251 Tremor, unspecified: Secondary | ICD-10-CM

## 2014-09-23 DIAGNOSIS — IMO0002 Reserved for concepts with insufficient information to code with codable children: Secondary | ICD-10-CM

## 2014-09-23 DIAGNOSIS — F419 Anxiety disorder, unspecified: Secondary | ICD-10-CM

## 2014-09-23 DIAGNOSIS — N183 Chronic kidney disease, stage 3 unspecified: Secondary | ICD-10-CM

## 2014-09-23 DIAGNOSIS — I10 Essential (primary) hypertension: Secondary | ICD-10-CM

## 2014-09-23 DIAGNOSIS — Z23 Encounter for immunization: Secondary | ICD-10-CM | POA: Diagnosis not present

## 2014-09-23 DIAGNOSIS — E1165 Type 2 diabetes mellitus with hyperglycemia: Secondary | ICD-10-CM | POA: Diagnosis not present

## 2014-09-23 DIAGNOSIS — E1129 Type 2 diabetes mellitus with other diabetic kidney complication: Secondary | ICD-10-CM

## 2014-09-23 DIAGNOSIS — E039 Hypothyroidism, unspecified: Secondary | ICD-10-CM | POA: Diagnosis not present

## 2014-09-23 DIAGNOSIS — R1310 Dysphagia, unspecified: Secondary | ICD-10-CM

## 2014-09-23 DIAGNOSIS — R609 Edema, unspecified: Secondary | ICD-10-CM | POA: Diagnosis not present

## 2014-09-23 MED ORDER — ALPRAZOLAM 0.5 MG PO TABS: ORAL_TABLET | ORAL | Status: DC

## 2014-09-23 MED ORDER — ERGOCALCIFEROL 1.25 MG (50000 UT) PO CAPS
50000.0000 [IU] | ORAL_CAPSULE | ORAL | Status: DC
Start: 2014-09-23 — End: 2015-09-17

## 2014-09-23 NOTE — Progress Notes (Signed)
Patient ID: Karen [name], female   DOB: [birth year], 81 y.o.   MRN: [id number]    Facility  [facility]    Place of Service:   OFFICE   Allergies  Allergen Reactions  . Celebrex [Celecoxib] Rash  . Doxycycline Rash  . Lyrica [Pregabalin] Rash  . Avandia [Rosiglitazone]   . Macrodantin [Nitrofurantoin Macrocrystal]   . Sulfa Antibiotics   . Vioxx [Rofecoxib]     Chief Complaint  Patient presents with  . Medical Management of Chronic Issues    4 month follow-up, discuss labs (copy printed)  . Shoulder Pain    Right shoulder pain, ongoing concern. Patient had injection in shoulder in November 2015  . Neck Pain    Patient c/o right side neck pain- ongoing. Pain is gettin gworse  . Headache    Patient c/o headache on left side since pneumonia in Jan 2016  . Immunizations    Discuss need for pneumonia vaccine, patient had pneumonia in Jan 2016    HPI:  Headache  Right shoulder pain. Can hardly lift it. Had injection by Dr. Gildardo Cranker last year that helped it.  Vitamin D deficiency - refill ergocalciferol (VITAMIN D2) 50000 UNITS capsule  Anxiety - benefits from ALPRAZolam (XANAX) 0.5 MG tablet  Type 2 diabetes mellitus, uncontrolled, with renal complications: Under better control. A1c now 7.0 and fasting glucose 116  Edema: Chronic, bilateral, and unchanged  Chronic kidney disease, stage III (moderate): Continue to monitor with lab  Essential hypertension: Controlled  Hypothyroidism, unspecified hypothyroidism type: Compensated  Peripheral vascular disease: Chronic and with some claudication-like symptoms at times.  Tremor: Unchanged  Pain in right shoulder: Worsening  Dysphagia: Stable  Needs Prevnar   Medications: Patient's Medications  New Prescriptions   No medications on file  Previous Medications   ALBUTEROL (PROVENTIL HFA;VENTOLIN HFA) 108 (90 BASE) MCG/ACT INHALER    Inhale 2 puffs into the lungs every 6 (six) hours as needed for wheezing or  shortness of breath.   ALPRAZOLAM (XANAX) 0.5 MG TABLET    Take one tablet by mouth up to 3 times daily as needed for anxiety or sleep   ASPIRIN EC 81 MG TABLET    One daily to help prevent stroke and heart attack   BLOOD GLUCOSE MONITORING SUPPL (BAYER BREEZE 2 SYSTEM) W/DEVICE KIT       CHLORPHENIRAMINE-HYDROCODONE (TUSSIONEX PENNKINETIC ER) 10-8 MG/5ML LQCR    5 cc every 12 hours to control cough   CRANBERRY PO    Take 300 mg by mouth daily.   DESOXIMETASONE (TOPICORT) 0.25 % CREAM    Apply to lesion daily   DIGOXIN (LANOXIN) 0.125 MG TABLET    Take one tablet by mouth on Mondays,Wednedsdays,Fridays and Saturdays   DIPHENHYDRAMINE-ZINC ACETATE (BENADRYL ITCH STOPPING) CREAM    Apply 1 application topically 3 (three) times daily as needed.   ERGOCALCIFEROL (VITAMIN D2) 50000 UNITS CAPSULE    Take 1 capsule (50,000 Units total) by mouth once a week.   FLUCONAZOLE (DIFLUCAN) 150 MG TABLET    Take 1 tablet (150 mg total) by mouth once.   FLUOCINONIDE CREAM (LIDEX) 0.05 %    Apply 1 application topically 2 (two) times daily.   FUROSEMIDE (LASIX) 40 MG TABLET    One each morning to control edema   GLUCOSE BLOOD (BAYER BREEZE 2 TEST VI)    Check blood sugar 3 x daily as directed. DX:250.02   HYDROCHLOROTHIAZIDE (HYDRODIURIL) 25 MG TABLET    Take 1 tablet (25 mg  total) by mouth daily.   HYDROCODONE-ACETAMINOPHEN (NORCO/VICODIN) 5-325 MG PER TABLET    Take one tablet by mouth four times daily as needed for pain   INSULIN ASPART (NOVOLOG FLEXPEN) 100 UNIT/ML FLEXPEN    Inject into the skin 3 (three) times daily with meals. 10 units in am, 12 units @ lunch, 14 units @ dinner   INSULIN GLARGINE (LANTUS SOLOSTAR) 100 UNIT/ML SOLOSTAR PEN    Inject 40 units at bed to control diabetes   INSULIN PEN NEEDLE 29G X 12.7MM MISC    Use as directed to administer insulin. DX: 250.40   INSULIN SYRINGE-NEEDLE U-100 (EASY COMFORT INSULIN SYRINGE) 30G X 1/2" 0.5 ML MISC    Use as Directed with insulin injections. Dx:  250.00   LANCET DEVICES (LANCING DEVICE) MISC       LEVOTHYROXINE (SYNTHROID, LEVOTHROID) 100 MCG TABLET    Take one tablet by mouth 30 minutes before breakfast for thyroid supplement   MOXIFLOXACIN (AVELOX) 400 MG TABLET    Take 1 tablet (400 mg total) by mouth daily.   OMEPRAZOLE (PRILOSEC) 40 MG CAPSULE    Take one capsule by mouth twice daily   ONDANSETRON (ZOFRAN) 4 MG TABLET    Take 1 tablet (4 mg total) by mouth every 4 (four) hours as needed for nausea or vomiting.   TIZANIDINE (ZANAFLEX) 4 MG CAPSULE    Take one tablet by mouth three times daily as needed to relax muscles.   TRIMETHOPRIM (TRIMPEX) 100 MG TABLET    Take one tablet by mouth every night at bedtime to prevent bladder infection   VITAMIN E PO    Take 400 Units by mouth daily.  Modified Medications   No medications on file  Discontinued Medications   ALBUTEROL (PROVENTIL) (2.5 MG/3ML) 0.083% NEBULIZER SOLUTION    Take 3 mLs (2.5 mg total) by nebulization once.   PREDNISONE (DELTASONE) 20 MG TABLET    Take 1 tablet (20 mg total) by mouth daily with breakfast.     Review of Systems  Constitutional: Positive for fatigue. Negative for fever, chills, activity change, appetite change and unexpected weight change.  HENT: Negative.   Eyes: Negative.   Respiratory: Positive for shortness of breath. Negative for wheezing.   Cardiovascular: Positive for leg swelling. Negative for chest pain and palpitations.       Bilateral mild peripheral arterial disease. She has been evaluated by Dr. Ruta Hinds, vascular surgery, on 06/21/12. He felt that she had bilateral mild peripheral arterial disease. Leg discomfort seems to be more related to her chronic back problems. She has mild external iliac artery stenosis, but does not have severe occlusive disease. No surgical intervention is warranted.  Gastrointestinal:       Spastic colon. Intermittent diarrhea. Has more problems with chronic constipation.  Endocrine: Negative for  polydipsia, polyphagia and polyuria.       Diabetic. History of thyroid problems. Thyroid has been removed. She is on supplements.  Genitourinary:       Urinary leakage and incontinence. Nocturia. Denies dysuria.  Musculoskeletal: Positive for back pain, joint swelling, arthralgias and gait problem.       Complaints of muscular weakness. Chronic backache. Arthritis in other joints. Unsteady gait. Bilateral lower leg discomfort. Left knee pain and swelling. Enlarged end of the right clavicle at the manubrium. Pain in the right shoulder . Difficulty raisin her arm. History of degenerative changes on Xray Dec. 2015.  Skin:       Seborrheic keratosis of the left  breast. Generalized itching without rash.  Neurological: Positive for tremors.       Has difficulty with balance. There are episodes of dizziness. She experiences numbness in her feet. She walks wobbly and unsteady. His numbness and weakness in the left leg.  Hematological: Negative.   Psychiatric/Behavioral:       Chronic anxiety. Sleeps okay.    Filed Vitals:   09/23/14 1325  BP: 138/72  Pulse: 92  Temp: 98.4 F (36.9 C)  TempSrc: Oral  Resp: 12  Height: $Remove'5\' 5"'hqFOZSA$  (1.651 m)  Weight: 139 lb (63.05 kg)  SpO2: 92%   Body mass index is 23.13 kg/(m^2).  Physical Exam  Constitutional: She is oriented to person, place, and time.  Frail, thin, elderly female.  HENT:  Head: Normocephalic and atraumatic.  Mouth/Throat: No oropharyngeal exudate.  Eyes: Conjunctivae and EOM are normal. Pupils are equal, round, and reactive to light.  Neck: No JVD present. No tracheal deviation present.  Nuchal rigidity. History thyroidectomy.  Cardiovascular: Normal rate.  Exam reveals no gallop and no friction rub.   No murmur heard. Irregularly irregular rhythm. Diminished pedal pulses  Pulmonary/Chest: No respiratory distress. She has no wheezes. She has no rales.  Abdominal: Soft. Bowel sounds are normal. She exhibits no distension and no  mass. There is no tenderness.  Musculoskeletal: She exhibits edema (2+ bipedal).  Stiff neck. Right shoulder discomfort on palpation. Unstable gait. Tender processes of the lumbar area. Bilateral lower paraspinal muscular tenderness. Bulbous, tender knees with increased stiffness and crepitus. Left leg worse than the right.  Lymphadenopathy:    She has no cervical adenopathy.  Neurological: She is alert and oriented to person, place, and time. No cranial nerve deficit. Coordination abnormal.  Difficulty with balance and with walking.  Skin: No rash noted. No erythema. No pallor.  Seborrheic keratosis left breast.  Psychiatric: Her behavior is normal. Thought content normal.  Moderate anxiety.     Labs reviewed: Appointment on 09/17/2014  Component Date Value Ref Range Status  . Hgb A1c MFr Bld 09/17/2014 7.0* 4.8 - 5.6 % Final   Comment:          Pre-diabetes: 5.7 - 6.4          Diabetes: >6.4          Glycemic control for adults with diabetes: <7.0   . Est. average glucose Bld gHb Est-m* 09/17/2014 154   Final  . Glucose 09/17/2014 116* 65 - 99 mg/dL Final  . BUN 09/17/2014 16  8 - 27 mg/dL Final  . Creatinine, Ser 09/17/2014 1.07* 0.57 - 1.00 mg/dL Final  . GFR calc non Af Amer 09/17/2014 46* >59 mL/min/1.73 Final  . GFR calc Af Amer 09/17/2014 54* >59 mL/min/1.73 Final  . BUN/Creatinine Ratio 09/17/2014 15  11 - 26 Final  . Sodium 09/17/2014 141  134 - 144 mmol/L Final  . Potassium 09/17/2014 4.4  3.5 - 5.2 mmol/L Final  . Chloride 09/17/2014 102  97 - 108 mmol/L Final  . CO2 09/17/2014 25  18 - 29 mmol/L Final  . Calcium 09/17/2014 8.9  8.7 - 10.3 mg/dL Final  . Total Protein 09/17/2014 6.1  6.0 - 8.5 g/dL Final  . Albumin 09/17/2014 3.9  3.5 - 4.7 g/dL Final  . Globulin, Total 09/17/2014 2.2  1.5 - 4.5 g/dL Final  . Albumin/Globulin Ratio 09/17/2014 1.8  1.1 - 2.5 Final  . Bilirubin Total 09/17/2014 0.3  0.0 - 1.2 mg/dL Final  . Alkaline Phosphatase 09/17/2014 85  39 - 117  IU/L Final  . AST 09/17/2014 19  0 - 40 IU/L Final  . ALT 09/17/2014 17  0 - 32 IU/L Final  . TSH 09/17/2014 3.110  0.450 - 4.500 uIU/mL Final  . Cholesterol, Total 09/17/2014 174  100 - 199 mg/dL Final  . Triglycerides 09/17/2014 127  0 - 149 mg/dL Final  . HDL 09/17/2014 44  >39 mg/dL Final   Comment: According to ATP-III Guidelines, HDL-C >59 mg/dL is considered a negative risk factor for CHD.   Marland Kitchen VLDL Cholesterol Cal 09/17/2014 25  5 - 40 mg/dL Final  . LDL Calculated 09/17/2014 105* 0 - 99 mg/dL Final  . Chol/HDL Ratio 09/17/2014 4.0  0.0 - 4.4 ratio units Final   Comment:                                   T. Chol/HDL Ratio                                             Men  Women                               1/2 Avg.Risk  3.4    3.3                                   Avg.Risk  5.0    4.4                                2X Avg.Risk  9.6    7.1                                3X Avg.Risk 23.4   11.0   Appointment on 08/29/2014  Component Date Value Ref Range Status  . Glucose 08/29/2014 107* 65 - 99 mg/dL Final  . BUN 08/29/2014 22  8 - 27 mg/dL Final  . Creatinine, Ser 08/29/2014 1.60* 0.57 - 1.00 mg/dL Final  . GFR calc non Af Amer 08/29/2014 29* >59 mL/min/1.73 Final  . GFR calc Af Amer 08/29/2014 33* >59 mL/min/1.73 Final  . BUN/Creatinine Ratio 08/29/2014 14  11 - 26 Final  . Sodium 08/29/2014 142  134 - 144 mmol/L Final  . Potassium 08/29/2014 4.7  3.5 - 5.2 mmol/L Final  . Chloride 08/29/2014 98  97 - 108 mmol/L Final  . CO2 08/29/2014 25  18 - 29 mmol/L Final  . Calcium 08/29/2014 8.9  8.7 - 10.3 mg/dL Final  Appointment on 08/25/2014  Component Date Value Ref Range Status  . Glucose 08/25/2014 74  65 - 99 mg/dL Final  . BUN 08/25/2014 26  8 - 27 mg/dL Final  . Creatinine, Ser 08/25/2014 2.01* 0.57 - 1.00 mg/dL Final  . GFR calc non Af Amer 08/25/2014 22* >59 mL/min/1.73 Final  . GFR calc Af Amer 08/25/2014 25* >59 mL/min/1.73 Final  . BUN/Creatinine Ratio 08/25/2014 13   11 - 26 Final  . Sodium 08/25/2014 142  134 - 144 mmol/L Final  . Potassium 08/25/2014 4.4  3.5 -  5.2 mmol/L Final  . Chloride 08/25/2014 95* 97 - 108 mmol/L Final  . CO2 08/25/2014 28  18 - 29 mmol/L Final  . Calcium 08/25/2014 9.1  8.7 - 10.3 mg/dL Final  Office Visit on 08/15/2014  Component Date Value Ref Range Status  . WBC 08/15/2014 10.6  3.4 - 10.8 x10E3/uL Final  . RBC 08/15/2014 3.85  3.77 - 5.28 x10E6/uL Final  . Hemoglobin 08/15/2014 11.7  11.1 - 15.9 g/dL Final  . HCT 08/15/2014 36.5  34.0 - 46.6 % Final  . MCV 08/15/2014 95  79 - 97 fL Final  . MCH 08/15/2014 30.4  26.6 - 33.0 pg Final  . MCHC 08/15/2014 32.1  31.5 - 35.7 g/dL Final  . RDW 08/15/2014 14.0  12.3 - 15.4 % Final  . Neutrophils Relative % 08/15/2014 65   Final  . Lymphs 08/15/2014 25   Final  . Monocytes 08/15/2014 9   Final  . Eos 08/15/2014 1   Final  . Basos 08/15/2014 0   Final  . Neutrophils Absolute 08/15/2014 6.9  1.4 - 7.0 x10E3/uL Final  . Lymphocytes Absolute 08/15/2014 2.6  0.7 - 3.1 x10E3/uL Final  . Monocytes Absolute 08/15/2014 1.0* 0.1 - 0.9 x10E3/uL Final  . Eosinophils Absolute 08/15/2014 0.1  0.0 - 0.4 x10E3/uL Final  . Basophils Absolute 08/15/2014 0.0  0.0 - 0.2 x10E3/uL Final  . Immature Granulocytes 08/15/2014 0   Final  . Immature Grans (Abs) 08/15/2014 0.0  0.0 - 0.1 x10E3/uL Final  . Glucose 08/15/2014 177* 65 - 99 mg/dL Final  . BUN 08/15/2014 25  8 - 27 mg/dL Final  . Creatinine, Ser 08/15/2014 1.69* 0.57 - 1.00 mg/dL Final  . GFR calc non Af Amer 08/15/2014 27* >59 mL/min/1.73 Final  . GFR calc Af Amer 08/15/2014 31* >59 mL/min/1.73 Final  . BUN/Creatinine Ratio 08/15/2014 15  11 - 26 Final  . Sodium 08/15/2014 137  134 - 144 mmol/L Final  . Potassium 08/15/2014 4.7  3.5 - 5.2 mmol/L Final  . Chloride 08/15/2014 94* 97 - 108 mmol/L Final  . CO2 08/15/2014 28  18 - 29 mmol/L Final  . Calcium 08/15/2014 9.0  8.7 - 10.3 mg/dL Final  Office Visit on 08/08/2014  Component Date  Value Ref Range Status  . WBC 08/08/2014 13.5* 3.4 - 10.8 x10E3/uL Final  . RBC 08/08/2014 3.77  3.77 - 5.28 x10E6/uL Final  . Hemoglobin 08/08/2014 11.6  11.1 - 15.9 g/dL Final  . HCT 08/08/2014 34.8  34.0 - 46.6 % Final  . MCV 08/08/2014 92  79 - 97 fL Final  . MCH 08/08/2014 30.8  26.6 - 33.0 pg Final  . MCHC 08/08/2014 33.3  31.5 - 35.7 g/dL Final  . RDW 08/08/2014 13.8  12.3 - 15.4 % Final  . Neutrophils Relative % 08/08/2014 74   Final  . Lymphs 08/08/2014 20   Final  . Monocytes 08/08/2014 5   Final  . Eos 08/08/2014 1   Final  . Basos 08/08/2014 0   Final  . Neutrophils Absolute 08/08/2014 9.9* 1.4 - 7.0 x10E3/uL Final  . Lymphocytes Absolute 08/08/2014 2.7  0.7 - 3.1 x10E3/uL Final  . Monocytes Absolute 08/08/2014 0.7  0.1 - 0.9 x10E3/uL Final  . Eosinophils Absolute 08/08/2014 0.1  0.0 - 0.4 x10E3/uL Final  . Basophils Absolute 08/08/2014 0.0  0.0 - 0.2 x10E3/uL Final  . Immature Granulocytes 08/08/2014 0   Final  . Immature Grans (Abs) 08/08/2014 0.0  0.0 - 0.1 x10E3/uL  Final  . Glucose 08/08/2014 164* 65 - 99 mg/dL Final  . BUN 08/08/2014 32* 8 - 27 mg/dL Final  . Creatinine, Ser 08/08/2014 1.58* 0.57 - 1.00 mg/dL Final  . GFR calc non Af Amer 08/08/2014 29* >59 mL/min/1.73 Final  . GFR calc Af Amer 08/08/2014 33* >59 mL/min/1.73 Final  . BUN/Creatinine Ratio 08/08/2014 20  11 - 26 Final  . Sodium 08/08/2014 138  134 - 144 mmol/L Final  . Potassium 08/08/2014 4.3  3.5 - 5.2 mmol/L Final  . Chloride 08/08/2014 94* 97 - 108 mmol/L Final  . CO2 08/08/2014 28  18 - 29 mmol/L Final  . Calcium 08/08/2014 9.0  8.7 - 10.3 mg/dL Final     Assessment/Plan 1. Vitamin D deficiency - ergocalciferol (VITAMIN D2) 50000 UNITS capsule; Take 1 capsule (50,000 Units total) by mouth once a week.  Dispense: 12 capsule; Refill: 3  2. Anxiety - ALPRAZolam (XANAX) 0.5 MG tablet; Take one tablet by mouth up to 3 times daily as needed for anxiety or sleep  Dispense: 90 tablet; Refill:  5  3. Type 2 diabetes mellitus, uncontrolled, with renal complications Continue current medication - Hemoglobin A1c; Future - Comprehensive metabolic panel; Future - Microalbumin, urine; Future - Pneumococcal conjugate vaccine 13-valent  4. Edema No change in orders  5. Chronic kidney disease, stage III (moderate) Follow-up lab next visit  6. Essential hypertension Rolled  7. Hypothyroidism, unspecified hypothyroidism type Compensated  8. Peripheral vascular disease Stable  9. Tremor Unchanged  10. Pain in right shoulder - Ambulatory referral to Orthopedic Surgery  11. Dysphagia Stable  12. Need for vaccination with 13-polyvalent pneumococcal conjugate vaccine - Pneumococcal conjugate vaccine 13-valent

## 2014-09-25 DIAGNOSIS — H35363 Drusen (degenerative) of macula, bilateral: Secondary | ICD-10-CM | POA: Diagnosis not present

## 2014-09-25 DIAGNOSIS — H43813 Vitreous degeneration, bilateral: Secondary | ICD-10-CM | POA: Diagnosis not present

## 2014-09-25 DIAGNOSIS — Z961 Presence of intraocular lens: Secondary | ICD-10-CM | POA: Diagnosis not present

## 2014-09-25 DIAGNOSIS — E119 Type 2 diabetes mellitus without complications: Secondary | ICD-10-CM | POA: Diagnosis not present

## 2014-09-25 DIAGNOSIS — H524 Presbyopia: Secondary | ICD-10-CM | POA: Diagnosis not present

## 2014-09-25 LAB — HM DIABETES EYE EXAM

## 2014-09-29 ENCOUNTER — Ambulatory Visit (INDEPENDENT_AMBULATORY_CARE_PROVIDER_SITE_OTHER): Payer: Medicare Other | Admitting: Internal Medicine

## 2014-09-29 ENCOUNTER — Encounter: Payer: Self-pay | Admitting: Internal Medicine

## 2014-09-29 VITALS — BP 158/62 | HR 88 | Temp 98.4°F | Resp 20 | Ht 65.0 in | Wt 137.8 lb

## 2014-09-29 DIAGNOSIS — J189 Pneumonia, unspecified organism: Secondary | ICD-10-CM | POA: Diagnosis not present

## 2014-09-29 DIAGNOSIS — N3000 Acute cystitis without hematuria: Secondary | ICD-10-CM

## 2014-09-29 LAB — POCT URINALYSIS DIPSTICK
Bilirubin, UA: NEGATIVE
Glucose, UA: NEGATIVE
Ketones, UA: NEGATIVE
Nitrite, UA: POSITIVE
Spec Grav, UA: 1.015
Urobilinogen, UA: NEGATIVE
pH, UA: 5

## 2014-09-29 MED ORDER — AMOXICILLIN 500 MG PO CAPS: 500.0000 mg | ORAL_CAPSULE | Freq: Three times a day (TID) | ORAL | Status: DC

## 2014-09-29 MED ORDER — FLUCONAZOLE 150 MG PO TABS: 150.0000 mg | ORAL_TABLET | Freq: Once | ORAL | Status: DC

## 2014-09-29 NOTE — Progress Notes (Signed)
Patient ID: Karen [name], female   DOB: 06-04-1924, 81 y.o.   MRN: [id number]   Location:  [facility] / [facility]   Allergies  Allergen Reactions  . Celebrex [Celecoxib] Rash  . Doxycycline Rash  . Lyrica [Pregabalin] Rash  . Avandia [Rosiglitazone]   . Macrodantin [Nitrofurantoin Macrocrystal]   . Sulfa Antibiotics   . Vioxx [Rofecoxib]     Chief Complaint  Patient presents with  . Acute Visit    ? UTI , pain when she urinates.    HPI: Patient is a 81 y.o. white female seen in the office today for an acute visit due to chills all through her body and a little dysuria, not itching.  Severe pain all through body.  Woke up that way Saturday (two days ago).  Takes hydrocodone/apap so already receiving tylenol.  Is afebrile currently.  Has flank pain also.  Is trying to drink a lot of gatorade, cranberry and water.    Has podiatry appt coming up and saw ophtho last week and needs new glasses--left eye worse.    Review of Systems:  Review of Systems  Constitutional: Positive for chills and malaise/fatigue. Negative for fever.  Respiratory: Negative for shortness of breath.   Cardiovascular: Negative for chest pain.  Gastrointestinal: Positive for abdominal pain and constipation.  Genitourinary: Positive for dysuria and flank pain. Negative for urgency, frequency and hematuria.  Musculoskeletal: Positive for back pain.  Neurological: Positive for weakness.     Past Medical History  Diagnosis Date  . Anemia   . Stroke   . Irregular heartbeat   . Sleep apnea   . Arthritis     body, neck  . TIA (transient ischemic attack) 2012  . Bladder disorder     over active  . Cancer     skin, removed  . Hiatal hernia   . Benign hypertensive heart disease   . CHF (congestive heart failure)   . Vitamin B12 deficiency   . Gastroparesis   . Hyperlipidemia   . Anxiety   . A-fib   . Spinal stenosis   . GERD (gastroesophageal reflux disease)   .  Esophageal stricture   . Peripheral vascular disease, unspecified   . Nausea alone   . Diarrhea   . Abnormal involuntary movements(781.0)   . Inflammatory disease of breast   . Transient ischemic attack (TIA), and cerebral infarction without residual deficits(V12.54)   . Urinary tract infection, site not specified   . Benign hypertensive heart disease with congestive heart failure   . Congestive heart failure, unspecified   . Nonspecific (abnormal) findings on radiological and other examination of skull and head   . Unspecified pruritic disorder   . Inflamed seborrheic keratosis   . Seborrheic keratosis   . Other B-complex deficiencies   . Chronic kidney disease, stage III (moderate)   . Type II or unspecified type diabetes mellitus with renal manifestations, not stated as uncontrolled   . Postmenopausal atrophic vaginitis   . Palpitations   . Mixed incontinence urge and stress (female)(female)   . Contact dermatitis and other eczema, due to unspecified cause   . Unspecified vitamin D deficiency   . Corns and callosities   . Gastroparesis   . Other specified visual disturbances   . Spasm of muscle   . Edema   . Pain in joint, lower leg   . Other and unspecified hyperlipidemia   . Anxiety state, unspecified   . Unspecified hereditary and  idiopathic peripheral neuropathy   . Unspecified constipation   . Unspecified essential hypertension   . Unspecified hypothyroidism   . Reflux esophagitis   . Other functional disorder of bladder   . Osteoarthrosis, unspecified whether generalized or localized, unspecified site   . Cervicalgia   . Spinal stenosis, unspecified region other than cervical   . Lumbago   . Unspecified sleep apnea   . Other malaise and fatigue   . Stricture and stenosis of esophagus     Past Surgical History  Procedure Laterality Date  . Back surgery      x 2  . Abdominal hysterectomy    . Thyroidectomy, partial  1965  . Cholecystectomy  1991  . Hammer toes     . Eye surgery  1994, 1997    bilateral cataract  . Hernia repair  1984aaaaaaaa  . Skin cancer destruction    . Esophageal  2004,2006    dilation, Dr Lyla Son    Social History:   reports that she has never smoked. She has never used smokeless tobacco. She reports that she does not drink alcohol or use illicit drugs.  Family History  Problem Relation Age of Onset  . Cancer Mother     ? stomach  . Heart disease Father   . Hyperlipidemia Father   . Hypertension Father   . Heart attack Father   . Heart disease Brother   . Kidney disease Daughter   . Heart disease Brother   . Cancer Son     Medications: Patient's Medications  New Prescriptions   No medications on file  Previous Medications   ALBUTEROL (PROVENTIL HFA;VENTOLIN HFA) 108 (90 BASE) MCG/ACT INHALER    Inhale 2 puffs into the lungs every 6 (six) hours as needed for wheezing or shortness of breath.   ALPRAZOLAM (XANAX) 0.5 MG TABLET    Take one tablet by mouth up to 3 times daily as needed for anxiety or sleep   ASPIRIN EC 81 MG TABLET    One daily to help prevent stroke and heart attack   BLOOD GLUCOSE MONITORING SUPPL (BAYER BREEZE 2 SYSTEM) W/DEVICE KIT       CHLORPHENIRAMINE-HYDROCODONE (TUSSIONEX PENNKINETIC ER) 10-8 MG/5ML LQCR    5 cc every 12 hours to control cough   CRANBERRY PO    Take 300 mg by mouth daily.   DESOXIMETASONE (TOPICORT) 0.25 % CREAM    Apply to lesion daily   DIGOXIN (LANOXIN) 0.125 MG TABLET    Take one tablet by mouth on Mondays,Wednedsdays,Fridays and Saturdays   DIPHENHYDRAMINE-ZINC ACETATE (BENADRYL ITCH STOPPING) CREAM    Apply 1 application topically 3 (three) times daily as needed.   ERGOCALCIFEROL (VITAMIN D2) 50000 UNITS CAPSULE    Take 1 capsule (50,000 Units total) by mouth once a week.   FLUCONAZOLE (DIFLUCAN) 150 MG TABLET    Take 1 tablet (150 mg total) by mouth once.   FLUOCINONIDE CREAM (LIDEX) 0.05 %    Apply 1 application topically 2 (two) times daily.   FUROSEMIDE  (LASIX) 40 MG TABLET    One each morning to control edema   GLUCOSE BLOOD (BAYER BREEZE 2 TEST VI)    Check blood sugar 3 x daily as directed. DX:250.02   HYDROCHLOROTHIAZIDE (HYDRODIURIL) 25 MG TABLET    Take 1 tablet (25 mg total) by mouth daily.   HYDROCODONE-ACETAMINOPHEN (NORCO/VICODIN) 5-325 MG PER TABLET    Take one tablet by mouth four times daily as needed for pain   INSULIN  ASPART (NOVOLOG FLEXPEN) 100 UNIT/ML FLEXPEN    Inject into the skin 3 (three) times daily with meals. 10 units in am, 12 units @ lunch, 14 units @ dinner   INSULIN GLARGINE (LANTUS SOLOSTAR) 100 UNIT/ML SOLOSTAR PEN    Inject 40 units at bed to control diabetes   INSULIN PEN NEEDLE 29G X 12.7MM MISC    Use as directed to administer insulin. DX: 250.40   INSULIN SYRINGE-NEEDLE U-100 (EASY COMFORT INSULIN SYRINGE) 30G X 1/2" 0.5 ML MISC    Use as Directed with insulin injections. Dx: 250.00   LANCET DEVICES (LANCING DEVICE) MISC       LEVOTHYROXINE (SYNTHROID, LEVOTHROID) 100 MCG TABLET    Take one tablet by mouth 30 minutes before breakfast for thyroid supplement   MOXIFLOXACIN (AVELOX) 400 MG TABLET    Take 1 tablet (400 mg total) by mouth daily.   OMEPRAZOLE (PRILOSEC) 40 MG CAPSULE    Take one capsule by mouth twice daily   ONDANSETRON (ZOFRAN) 4 MG TABLET    Take 1 tablet (4 mg total) by mouth every 4 (four) hours as needed for nausea or vomiting.   TIZANIDINE (ZANAFLEX) 4 MG CAPSULE    Take one tablet by mouth three times daily as needed to relax muscles.   TRIMETHOPRIM (TRIMPEX) 100 MG TABLET    Take one tablet by mouth every night at bedtime to prevent bladder infection   VITAMIN E PO    Take 400 Units by mouth daily.  Modified Medications   No medications on file  Discontinued Medications   No medications on file     Physical Exam: Filed Vitals:   09/29/14 1606  BP: 158/62  Pulse: 88  Temp: 98.4 F (36.9 C)  TempSrc: Oral  Resp: 20  Height: _0  (1.651 m)  Weight: 137 lb 12.8 oz (62.506 kg)    SpO2: 92%  Physical Exam  Constitutional: She is oriented to person, place, and time.  Thin white female, immaculately dressed  Cardiovascular: Normal rate, regular rhythm, normal heart sounds and intact distal pulses.   Pulmonary/Chest: Effort normal and breath sounds normal.  Abdominal: Soft. Bowel sounds are normal. There is tenderness.  Genitourinary:  Tenderness in suprapubic area and left flank region  Musculoskeletal: Normal range of motion.  Stooped posture with shuffling gait  Neurological: She is alert and oriented to person, place, and time.  Skin: Skin is warm and dry. There is pallor.    Labs reviewed: Basic Metabolic Panel:  Recent Labs  01/09/14 0841 05/14/14 0842  08/25/14 1027 08/29/14 0904 09/17/14 0921  NA 140 137  < > 142 142 141  K 4.9 4.5  < > 4.4 4.7 4.4  CL 98 96*  < > 95* 98 102  CO2 28 27  < > _1 GLUCOSE 125* 98  < > 74 107* 116*  BUN 20 19  < > _2 CREATININE 1.29* 1.34*  < > 2.01* 1.60* 1.07*  CALCIUM 9.0 9.3  < > 9.1 8.9 8.9  TSH 3.370 4.640*  --   --   --  3.110  < > = values in this interval not displayed. Liver Function Tests:  Recent Labs  01/09/14 0841 09/17/14 0921  AST 23 19  ALT 24 17  ALKPHOS 78 85  BILITOT 0.4 0.3  PROT 6.4 6.1   No results for input(s): LIPASE, AMYLASE in the last 8760 hours. No results for input(s): AMMONIA in the last 8760 hours.  CBC:  Recent Labs  08/08/14 1250 08/15/14 1036  WBC 13.5* 10.6  NEUTROABS 9.9* 6.9  HGB 11.6 11.7  HCT 34.8 36.5  MCV 92 95   Lipid Panel:  Recent Labs  10/14/13 0934 09/17/14 0921  CHOL 156 174  HDL 43 44  LDLCALC 93 105*  TRIG 99 127  CHOLHDL 3.6 4.0   Lab Results  Component Value Date   HGBA1C 7.0* 09/17/2014   Assessment/Plan 1. Acute cystitis without hematuria - POC Urinalysis Dipstick was positive for leukocyte esterase and nitrite, wbcs -already hydrating--increase water and cranberry juice - tells me she has done well with  amoxicillin in past -amoxicillin (AMOXIL) 500 MG capsule; Take 1 capsule (500 mg total) by mouth 3 (three) times daily.  Dispense: 30 capsule; Refill: 0 - Culture, Urine sent--await sensitivities -diflucan prescribed prn for yeast infection if she develops this (2 pills ordered)  Labs/tests ordered:   Orders Placed This Encounter  Procedures  . Culture, Urine  . POC Urinalysis Dipstick   Next appt: prn, keep regular appt with Dr. Nyoka Cowden  Jennessy Sandridge L. Denim Start, D.O. [facility] [address], [place] [address] Cell Phone (Mon-Fri 8am-5pm):  [phone] On Call:  [phone] & follow prompts after 5pm & weekends Office Phone:  [phone] Office Fax:  [phone]

## 2014-09-30 ENCOUNTER — Encounter: Payer: Self-pay | Admitting: Podiatry

## 2014-09-30 ENCOUNTER — Encounter: Payer: Self-pay | Admitting: *Deleted

## 2014-09-30 ENCOUNTER — Ambulatory Visit (INDEPENDENT_AMBULATORY_CARE_PROVIDER_SITE_OTHER): Payer: Medicare Other | Admitting: Podiatry

## 2014-09-30 VITALS — BP 141/72 | HR 86

## 2014-09-30 DIAGNOSIS — M79676 Pain in unspecified toe(s): Secondary | ICD-10-CM

## 2014-09-30 DIAGNOSIS — E1140 Type 2 diabetes mellitus with diabetic neuropathy, unspecified: Secondary | ICD-10-CM

## 2014-09-30 DIAGNOSIS — B351 Tinea unguium: Secondary | ICD-10-CM

## 2014-09-30 DIAGNOSIS — L84 Corns and callosities: Secondary | ICD-10-CM

## 2014-09-30 DIAGNOSIS — E1149 Type 2 diabetes mellitus with other diabetic neurological complication: Secondary | ICD-10-CM

## 2014-09-30 NOTE — Patient Instructions (Signed)
Diabetes and Foot Care Diabetes may cause you to have problems because of poor blood supply (circulation) to your feet and legs. This may cause the skin on your feet to become thinner, break easier, and heal more slowly. Your skin may become dry, and the skin may peel and crack. You may also have nerve damage in your legs and feet causing decreased feeling in them. You may not notice minor injuries to your feet that could lead to infections or more serious problems. Taking care of your feet is one of the most important things you can do for yourself.  HOME CARE INSTRUCTIONS  Wear shoes at all times, even in the house. Do not go barefoot. Bare feet are easily injured.  Check your feet daily for blisters, cuts, and redness. If you cannot see the bottom of your feet, use a mirror or ask someone for help.  Wash your feet with warm water (do not use hot water) and mild soap. Then pat your feet and the areas between your toes until they are completely dry. Do not soak your feet as this can dry your skin.  Apply a moisturizing lotion or petroleum jelly (that does not contain alcohol and is unscented) to the skin on your feet and to dry, brittle toenails. Do not apply lotion between your toes.  Trim your toenails straight across. Do not dig under them or around the cuticle. File the edges of your nails with an emery board or nail file.  Do not cut corns or calluses or try to remove them with medicine.  Wear clean socks or stockings every day. Make sure they are not too tight. Do not wear knee-high stockings since they may decrease blood flow to your legs.  Wear shoes that fit properly and have enough cushioning. To break in new shoes, wear them for just a few hours a day. This prevents you from injuring your feet. Always look in your shoes before you put them on to be sure there are no objects inside.  Do not cross your legs. This may decrease the blood flow to your feet.  If you find a minor scrape,  cut, or break in the skin on your feet, keep it and the skin around it clean and dry. These areas may be cleansed with mild soap and water. Do not cleanse the area with peroxide, alcohol, or iodine.  When you remove an adhesive bandage, be sure not to damage the skin around it.  If you have a wound, look at it several times a day to make sure it is healing.  Do not use heating pads or hot water bottles. They may burn your skin. If you have lost feeling in your feet or legs, you may not know it is happening until it is too late.  Make sure your health care provider performs a complete foot exam at least annually or more often if you have foot problems. Report any cuts, sores, or bruises to your health care provider immediately. SEEK MEDICAL CARE IF:   You have an injury that is not healing.  You have cuts or breaks in the skin.  You have an ingrown nail.  You notice redness on your legs or feet.  You feel burning or tingling in your legs or feet.  You have pain or cramps in your legs and feet.  Your legs or feet are numb.  Your feet always feel cold. SEEK IMMEDIATE MEDICAL CARE IF:   There is increasing redness,   swelling, or pain in or around a wound.  There is a red line that goes up your leg.  Pus is coming from a wound.  You develop a fever or as directed by your health care provider.  You notice a bad smell coming from an ulcer or wound. Document Released: 07/15/2000 Document Revised: 03/20/2013 Document Reviewed: 12/25/2012 Exit[employer] Patient Information 2015 [employer]. This information is not intended to replace advice given to you by your health care provider. Make sure you discuss any questions you have with your health care provider.  

## 2014-09-30 NOTE — Progress Notes (Signed)
Patient ID: Karen [name], female   DOB: 1923-11-21, 81 y.o.   MRN: [id number]  Subjective: 81 y.o.-year-old female returns the office today for painful, elongated, thickened toenails. Denies any redness or drainage around the nails. She states her last HbA1c was 7. Denies any acute changes since last appointment and no new complaints today. Denies any systemic complaints such as fevers, chills, nausea, vomiting.   Objective: AAO 3, NAD DP/PT pulses palpable 1/4, CRT less than 3 seconds Protective sensation decreased with Simms Weinstein monofilament, Achilles tendon reflex intact.  Nails hypertrophic, dystrophic, elongated, brittle, discolored 10. There is tenderness overlying these nails. There is no surrounding erythema or drainage along the nail sites. No open lesions bilaterally. On the posterior aspect of the right heel there is a hyperkerotic lesion over the site of a previous ulceration. Upon debridement, no underlying ulceration, drainage, or other clinical signs of infection.  No other areas of tenderness bilateral lower extremities. No overlying edema, erythema, increased warmth. No pain with calf compression, swelling, warmth, erythema.  Assessment: Patient presents with symptomatic onychomycosis, pre-ulceration callus right posterior heel.   Plan: -Treatment options including alternatives, risks, complications were discussed -Nails sharply debrided 10 without complication/bleeding. -Hyperkerotic lesion sharply debrided x1 without complication/bleeding.  -Discussed daily foot inspection. If there are any changes, to call the office immediately.  -Follow-up in 3 months or sooner if any problems are to arise. In the meantime, encouraged to call the office with any questions, concerns, changes symptoms.

## 2014-10-01 ENCOUNTER — Other Ambulatory Visit: Payer: Self-pay | Admitting: *Deleted

## 2014-10-01 ENCOUNTER — Other Ambulatory Visit: Payer: Self-pay

## 2014-10-01 LAB — URINE CULTURE

## 2014-10-01 MED ORDER — AMOXICILLIN-POT CLAVULANATE 875-125 MG PO TABS: ORAL_TABLET | ORAL | Status: DC

## 2014-10-01 MED ORDER — LEVOTHYROXINE SODIUM 100 MCG PO TABS: ORAL_TABLET | ORAL | Status: DC

## 2014-10-01 NOTE — Telephone Encounter (Signed)
Faxed Rx into pharmacy due to UTI per Dr. Mariea Clonts. See labs.

## 2014-10-07 ENCOUNTER — Other Ambulatory Visit: Payer: Self-pay | Admitting: *Deleted

## 2014-10-07 ENCOUNTER — Encounter: Payer: Self-pay | Admitting: Internal Medicine

## 2014-10-07 MED ORDER — HYDROCODONE-ACETAMINOPHEN 5-325 MG PO TABS: ORAL_TABLET | ORAL | Status: DC

## 2014-10-07 NOTE — Telephone Encounter (Signed)
Fax from [facility]. Patient due.

## 2014-10-14 DIAGNOSIS — M519 Unspecified thoracic, thoracolumbar and lumbosacral intervertebral disc disorder: Secondary | ICD-10-CM | POA: Diagnosis not present

## 2014-10-15 DIAGNOSIS — M25511 Pain in right shoulder: Secondary | ICD-10-CM | POA: Diagnosis not present

## 2014-10-15 DIAGNOSIS — M519 Unspecified thoracic, thoracolumbar and lumbosacral intervertebral disc disorder: Secondary | ICD-10-CM | POA: Diagnosis not present

## 2014-10-21 DIAGNOSIS — M519 Unspecified thoracic, thoracolumbar and lumbosacral intervertebral disc disorder: Secondary | ICD-10-CM | POA: Diagnosis not present

## 2014-11-06 ENCOUNTER — Other Ambulatory Visit: Payer: Self-pay | Admitting: *Deleted

## 2014-11-06 MED ORDER — HYDROCODONE-ACETAMINOPHEN 5-325 MG PO TABS: ORAL_TABLET | ORAL | Status: DC

## 2014-11-06 NOTE — Telephone Encounter (Signed)
Patient Requested and will pick up 

## 2014-11-12 ENCOUNTER — Ambulatory Visit (INDEPENDENT_AMBULATORY_CARE_PROVIDER_SITE_OTHER): Payer: Medicare Other | Admitting: Internal Medicine

## 2014-11-12 ENCOUNTER — Encounter: Payer: Self-pay | Admitting: Internal Medicine

## 2014-11-12 VITALS — BP 152/72 | HR 96 | Temp 98.4°F | Ht 65.0 in | Wt 136.0 lb

## 2014-11-12 DIAGNOSIS — M170 Bilateral primary osteoarthritis of knee: Secondary | ICD-10-CM

## 2014-11-12 NOTE — Progress Notes (Signed)
Patient ID: Karen [name], female   DOB: [birth year], 80 y.o.   MRN: [id number]    Facility  [facility]    Place of Service:   OFFICE    Allergies  Allergen Reactions  . Celebrex [Celecoxib] Rash  . Doxycycline Rash  . Lyrica [Pregabalin] Rash  . Avandia [Rosiglitazone]   . Macrodantin [Nitrofurantoin Macrocrystal]   . Sulfa Antibiotics   . Vioxx [Rofecoxib]     Chief Complaint  Patient presents with  . Knee Pain    both knees hurt, started Friday. Dr. Mariea Clonts gave both knees injections 04/10/14    HPI:   Primary osteoarthritis of both knees    Medications: Patient's Medications  New Prescriptions   No medications on file  Previous Medications   ALPRAZOLAM (XANAX) 0.5 MG TABLET    Take one tablet by mouth up to 3 times daily as needed for anxiety or sleep   AMOXICILLIN (AMOXIL) 500 MG CAPSULE    Take 1 capsule (500 mg total) by mouth 3 (three) times daily.   AMOXICILLIN-CLAVULANATE (AUGMENTIN) 875-125 MG PER TABLET    Take one tablet by mouth twice daily for infection   ASPIRIN EC 81 MG TABLET    One daily to help prevent stroke and heart attack   BLOOD GLUCOSE MONITORING SUPPL (BAYER BREEZE 2 SYSTEM) W/DEVICE KIT       CHLORPHENIRAMINE-HYDROCODONE (TUSSIONEX PENNKINETIC ER) 10-8 MG/5ML LQCR    5 cc every 12 hours to control cough   CRANBERRY PO    Take 300 mg by mouth daily.   DESOXIMETASONE (TOPICORT) 0.25 % CREAM    Apply to lesion daily   DIGOXIN (LANOXIN) 0.125 MG TABLET    Take one tablet by mouth on Mondays,Wednedsdays,Fridays and Saturdays   DIPHENHYDRAMINE-ZINC ACETATE (BENADRYL ITCH STOPPING) CREAM    Apply 1 application topically 3 (three) times daily as needed.   ERGOCALCIFEROL (VITAMIN D2) 50000 UNITS CAPSULE    Take 1 capsule (50,000 Units total) by mouth once a week.   FLUCONAZOLE (DIFLUCAN) 150 MG TABLET    Take 1 tablet (150 mg total) by mouth once. If you develop yeast infection after antibiotics   FLUOCINONIDE CREAM (LIDEX) 0.05 %    Apply 1 application  topically 2 (two) times daily.   FUROSEMIDE (LASIX) 40 MG TABLET    One each morning to control edema   GLUCOSE BLOOD (BAYER BREEZE 2 TEST VI)    Check blood sugar 3 x daily as directed. DX:250.02   HYDROCHLOROTHIAZIDE (HYDRODIURIL) 25 MG TABLET    Take 1 tablet (25 mg total) by mouth daily.   HYDROCODONE-ACETAMINOPHEN (NORCO/VICODIN) 5-325 MG PER TABLET    Take one tablet by mouth four times daily as needed for pain   INSULIN ASPART (NOVOLOG FLEXPEN) 100 UNIT/ML FLEXPEN    Inject into the skin 3 (three) times daily with meals. 10 units in am, 12 units @ lunch, 14 units @ dinner   INSULIN GLARGINE (LANTUS SOLOSTAR) 100 UNIT/ML SOLOSTAR PEN    Inject 40 units at bed to control diabetes   INSULIN PEN NEEDLE 29G X 12.7MM MISC    Use as directed to administer insulin. DX: 250.40   INSULIN SYRINGE-NEEDLE U-100 (EASY COMFORT INSULIN SYRINGE) 30G X 1/2" 0.5 ML MISC    Use as Directed with insulin injections. Dx: 250.00   LANCET DEVICES (LANCING DEVICE) MISC       LEVOTHYROXINE (SYNTHROID, LEVOTHROID) 100 MCG TABLET    Take one tablet by mouth 30 minutes before breakfast for thyroid  supplement   MOXIFLOXACIN (AVELOX) 400 MG TABLET    Take 1 tablet (400 mg total) by mouth daily.   OMEPRAZOLE (PRILOSEC) 40 MG CAPSULE    Take one capsule by mouth twice daily   ONDANSETRON (ZOFRAN) 4 MG TABLET    Take 1 tablet (4 mg total) by mouth every 4 (four) hours as needed for nausea or vomiting.   PROAIR HFA 108 (90 BASE) MCG/ACT INHALER       TIZANIDINE (ZANAFLEX) 4 MG CAPSULE    Take one tablet by mouth three times daily as needed to relax muscles.   TRIMETHOPRIM (TRIMPEX) 100 MG TABLET    Take one tablet by mouth every night at bedtime to prevent bladder infection   VITAMIN E PO    Take 400 Units by mouth daily.  Modified Medications   No medications on file  Discontinued Medications   ALBUTEROL (PROVENTIL HFA;VENTOLIN HFA) 108 (90 BASE) MCG/ACT INHALER    Inhale 2 puffs into the lungs every 6 (six) hours as  needed for wheezing or shortness of breath.     Review of Systems  Constitutional: Positive for fatigue. Negative for fever, chills, activity change, appetite change and unexpected weight change.  HENT: Negative.   Eyes: Negative.   Respiratory: Positive for shortness of breath. Negative for wheezing.   Cardiovascular: Positive for leg swelling. Negative for chest pain and palpitations.       Bilateral mild peripheral arterial disease. She has been evaluated by Dr. Ruta Hinds, vascular surgery, on 06/21/12. He felt that she had bilateral mild peripheral arterial disease. Leg discomfort seems to be more related to her chronic back problems. She has mild external iliac artery stenosis, but does not have severe occlusive disease. No surgical intervention is warranted.  Gastrointestinal:       Spastic colon. Intermittent diarrhea. Has more problems with chronic constipation.  Endocrine: Negative for polydipsia, polyphagia and polyuria.       Diabetic. History of thyroid problems. Thyroid has been removed. She is on supplements.  Genitourinary:       Urinary leakage and incontinence. Nocturia. Denies dysuria.  Musculoskeletal: Positive for back pain, joint swelling, arthralgias and gait problem.       Complaints of muscular weakness. Chronic backache. Arthritis in other joints. Unsteady gait. Bilateral lower leg discomfort. Bilateral knee pains Enlarged end of the right clavicle at the manubrium. Pain in the right shoulder . Difficulty raising her arm. History of degenerative changes on Xray Dec. 2015.  Skin:       Seborrheic keratosis of the left breast. Generalized itching without rash.  Neurological: Positive for tremors.       Has difficulty with balance. There are episodes of dizziness. She experiences numbness in her feet. She walks wobbly and unsteady. His numbness and weakness in the left leg.  Hematological: Negative.   Psychiatric/Behavioral:       Chronic anxiety. Sleeps okay.     Filed Vitals:   11/12/14 1234  BP: 152/72  Pulse: 96  Temp: 98.4 F (36.9 C)  TempSrc: Oral  Height: $Remove'5\' 5"'oGxOgCg$  (1.651 m)  Weight: 136 lb (61.689 kg)  SpO2: 96%   Body mass index is 22.63 kg/(m^2).  Physical Exam  Constitutional: She is oriented to person, place, and time.  Frail, thin, elderly female.  HENT:  Head: Normocephalic and atraumatic.  Mouth/Throat: No oropharyngeal exudate.  Eyes: Conjunctivae and EOM are normal. Pupils are equal, round, and reactive to light.  Neck: No JVD present. No tracheal deviation  present.  Nuchal rigidity. History thyroidectomy.  Cardiovascular: Normal rate.  Exam reveals no gallop and no friction rub.   No murmur heard. Irregularly irregular rhythm. Diminished pedal pulses  Pulmonary/Chest: No respiratory distress. She has no wheezes. She has no rales.  Abdominal: Soft. Bowel sounds are normal. She exhibits no distension and no mass. There is no tenderness.  Musculoskeletal: She exhibits edema (2+ bipedal).  Stiff neck. Right shoulder discomfort on palpation. Unstable gait. Tender processes of the lumbar area. Bilateral lower paraspinal muscular tenderness. Bulbous, tender knees with increased stiffness and crepitus.  Lymphadenopathy:    She has no cervical adenopathy.  Neurological: She is alert and oriented to person, place, and time. No cranial nerve deficit. Coordination abnormal.  Difficulty with balance and with walking.  Skin: No rash noted. No erythema. No pallor.  Seborrheic keratosis left breast.  Psychiatric: Her behavior is normal. Thought content normal.  Moderate anxiety.     Labs reviewed: Abstract on 09/30/2014  Component Date Value Ref Range Status  . HM Diabetic Eye Exam 09/25/2014 No Retinopathy  No Retinopathy Final   [facility]  Office Visit on 09/29/2014  Component Date Value Ref Range Status  . Color, UA 09/29/2014 YELLOW   Final  . Clarity, UA 09/29/2014 CLOUDY   Final  . Glucose, UA 09/29/2014  NEGATIVE   Final  . Bilirubin, UA 09/29/2014 NEGATIVE   Final  . Ketones, UA 09/29/2014 NEGATIVE   Final  . Spec Grav, UA 09/29/2014 1.015   Final  . Blood, UA 09/29/2014 TRACE   Final  . pH, UA 09/29/2014 5.0   Final  . Protein, UA 09/29/2014 TRACE   Final  . Urobilinogen, UA 09/29/2014 negative   Final  . Nitrite, UA 09/29/2014 POSITIVE   Final  . Leukocytes, UA 09/29/2014 moderate (2+)   Final  . Urine Culture, Routine 09/29/2014 Final report*  Final  . Result 1 09/29/2014 Escherichia coli*  Final   Greater than 100,000 colony forming units per mL  . ANTIMICROBIAL SUSCEPTIBILITY 09/29/2014 Comment   Final   Comment:       ** S = Susceptible; I = Intermediate; R = Resistant **                    P = Positive; N = Negative             MICS are expressed in micrograms per mL    Antibiotic                 RSLT#1    RSLT#2    RSLT#3    RSLT#4 Amoxicillin/Clavulanic Acid    S Ampicillin                     R Cefepime                       S Ceftriaxone                    S Cefuroxime                     S Cephalothin                    I Ciprofloxacin                  S Ertapenem  S Gentamicin                     S Imipenem                       S Levofloxacin                   S Nitrofurantoin                 S Piperacillin                   R Tetracycline                   S Tobramycin                     S Trimethoprim/Sulfa             R   Appointment on 09/17/2014  Component Date Value Ref Range Status  . Hgb A1c MFr Bld 09/17/2014 7.0* 4.8 - 5.6 % Final   Comment:          Pre-diabetes: 5.7 - 6.4          Diabetes: >6.4          Glycemic control for adults with diabetes: <7.0   . Est. average glucose Bld gHb Est-m* 09/17/2014 154   Final  . Glucose 09/17/2014 116* 65 - 99 mg/dL Final  . BUN 09/17/2014 16  8 - 27 mg/dL Final  . Creatinine, Ser 09/17/2014 1.07* 0.57 - 1.00 mg/dL Final  . GFR calc non Af Amer 09/17/2014 46* >59 mL/min/1.73 Final  .  GFR calc Af Amer 09/17/2014 54* >59 mL/min/1.73 Final  . BUN/Creatinine Ratio 09/17/2014 15  11 - 26 Final  . Sodium 09/17/2014 141  134 - 144 mmol/L Final  . Potassium 09/17/2014 4.4  3.5 - 5.2 mmol/L Final  . Chloride 09/17/2014 102  97 - 108 mmol/L Final  . CO2 09/17/2014 25  18 - 29 mmol/L Final  . Calcium 09/17/2014 8.9  8.7 - 10.3 mg/dL Final  . Total Protein 09/17/2014 6.1  6.0 - 8.5 g/dL Final  . Albumin 09/17/2014 3.9  3.5 - 4.7 g/dL Final  . Globulin, Total 09/17/2014 2.2  1.5 - 4.5 g/dL Final  . Albumin/Globulin Ratio 09/17/2014 1.8  1.1 - 2.5 Final  . Bilirubin Total 09/17/2014 0.3  0.0 - 1.2 mg/dL Final  . Alkaline Phosphatase 09/17/2014 85  39 - 117 IU/L Final  . AST 09/17/2014 19  0 - 40 IU/L Final  . ALT 09/17/2014 17  0 - 32 IU/L Final  . TSH 09/17/2014 3.110  0.450 - 4.500 uIU/mL Final  . Cholesterol, Total 09/17/2014 174  100 - 199 mg/dL Final  . Triglycerides 09/17/2014 127  0 - 149 mg/dL Final  . HDL 09/17/2014 44  >39 mg/dL Final   Comment: According to ATP-III Guidelines, HDL-C >59 mg/dL is considered a negative risk factor for CHD.   Marland Kitchen VLDL Cholesterol Cal 09/17/2014 25  5 - 40 mg/dL Final  . LDL Calculated 09/17/2014 105* 0 - 99 mg/dL Final  . Chol/HDL Ratio 09/17/2014 4.0  0.0 - 4.4 ratio units Final   Comment:                                   T. Chol/HDL Ratio  Men  Women                               1/2 Avg.Risk  3.4    3.3                                   Avg.Risk  5.0    4.4                                2X Avg.Risk  9.6    7.1                                3X Avg.Risk 23.4   11.0   Appointment on 08/29/2014  Component Date Value Ref Range Status  . Glucose 08/29/2014 107* 65 - 99 mg/dL Final  . BUN 08/29/2014 22  8 - 27 mg/dL Final  . Creatinine, Ser 08/29/2014 1.60* 0.57 - 1.00 mg/dL Final  . GFR calc non Af Amer 08/29/2014 29* >59 mL/min/1.73 Final  . GFR calc Af Amer 08/29/2014 33* >59 mL/min/1.73  Final  . BUN/Creatinine Ratio 08/29/2014 14  11 - 26 Final  . Sodium 08/29/2014 142  134 - 144 mmol/L Final  . Potassium 08/29/2014 4.7  3.5 - 5.2 mmol/L Final  . Chloride 08/29/2014 98  97 - 108 mmol/L Final  . CO2 08/29/2014 25  18 - 29 mmol/L Final  . Calcium 08/29/2014 8.9  8.7 - 10.3 mg/dL Final  Appointment on 08/25/2014  Component Date Value Ref Range Status  . Glucose 08/25/2014 74  65 - 99 mg/dL Final  . BUN 08/25/2014 26  8 - 27 mg/dL Final  . Creatinine, Ser 08/25/2014 2.01* 0.57 - 1.00 mg/dL Final  . GFR calc non Af Amer 08/25/2014 22* >59 mL/min/1.73 Final  . GFR calc Af Amer 08/25/2014 25* >59 mL/min/1.73 Final  . BUN/Creatinine Ratio 08/25/2014 13  11 - 26 Final  . Sodium 08/25/2014 142  134 - 144 mmol/L Final  . Potassium 08/25/2014 4.4  3.5 - 5.2 mmol/L Final  . Chloride 08/25/2014 95* 97 - 108 mmol/L Final  . CO2 08/25/2014 28  18 - 29 mmol/L Final  . Calcium 08/25/2014 9.1  8.7 - 10.3 mg/dL Final  Office Visit on 08/15/2014  Component Date Value Ref Range Status  . WBC 08/15/2014 10.6  3.4 - 10.8 x10E3/uL Final  . RBC 08/15/2014 3.85  3.77 - 5.28 x10E6/uL Final  . Hemoglobin 08/15/2014 11.7  11.1 - 15.9 g/dL Final  . HCT 08/15/2014 36.5  34.0 - 46.6 % Final  . MCV 08/15/2014 95  79 - 97 fL Final  . MCH 08/15/2014 30.4  26.6 - 33.0 pg Final  . MCHC 08/15/2014 32.1  31.5 - 35.7 g/dL Final  . RDW 08/15/2014 14.0  12.3 - 15.4 % Final  . Neutrophils Relative % 08/15/2014 65   Final  . Lymphs 08/15/2014 25   Final  . Monocytes 08/15/2014 9   Final  . Eos 08/15/2014 1   Final  . Basos 08/15/2014 0   Final  . Neutrophils Absolute 08/15/2014 6.9  1.4 - 7.0 x10E3/uL Final  . Lymphocytes Absolute 08/15/2014 2.6  0.7 - 3.1 x10E3/uL Final  . Monocytes Absolute 08/15/2014 1.0* 0.1 - 0.9 x10E3/uL  Final  . Eosinophils Absolute 08/15/2014 0.1  0.0 - 0.4 x10E3/uL Final  . Basophils Absolute 08/15/2014 0.0  0.0 - 0.2 x10E3/uL Final  . Immature Granulocytes 08/15/2014 0   Final   . Immature Grans (Abs) 08/15/2014 0.0  0.0 - 0.1 x10E3/uL Final  . Glucose 08/15/2014 177* 65 - 99 mg/dL Final  . BUN 08/15/2014 25  8 - 27 mg/dL Final  . Creatinine, Ser 08/15/2014 1.69* 0.57 - 1.00 mg/dL Final  . GFR calc non Af Amer 08/15/2014 27* >59 mL/min/1.73 Final  . GFR calc Af Amer 08/15/2014 31* >59 mL/min/1.73 Final  . BUN/Creatinine Ratio 08/15/2014 15  11 - 26 Final  . Sodium 08/15/2014 137  134 - 144 mmol/L Final  . Potassium 08/15/2014 4.7  3.5 - 5.2 mmol/L Final  . Chloride 08/15/2014 94* 97 - 108 mmol/L Final  . CO2 08/15/2014 28  18 - 29 mmol/L Final  . Calcium 08/15/2014 9.0  8.7 - 10.3 mg/dL Final     Assessment/Plan  1. Primary osteoarthritis of both knees Injected each knee with Kenalog-41 mL and 2 mL 1% lidocaine. Procedure was tolerated well by the patient. She experienced immediate relief from her knee pains and was walking much easier when she left our office.

## 2014-11-13 DIAGNOSIS — M170 Bilateral primary osteoarthritis of knee: Secondary | ICD-10-CM | POA: Diagnosis not present

## 2014-11-13 MED ORDER — TRIAMCINOLONE ACETONIDE 40 MG/ML IJ SUSP
80.0000 mg | Freq: Once | INTRAMUSCULAR | Status: AC
  Administered 2014-11-13: 80 mg via INTRAMUSCULAR

## 2014-11-13 NOTE — Addendum Note (Signed)
Addended by: Eilene Ghazi on: 11/13/2014 03:45 PM   Modules accepted: Orders

## 2014-11-19 DIAGNOSIS — H3531 Nonexudative age-related macular degeneration: Secondary | ICD-10-CM | POA: Diagnosis not present

## 2014-11-28 ENCOUNTER — Telehealth: Payer: Self-pay | Admitting: *Deleted

## 2014-11-28 DIAGNOSIS — N3000 Acute cystitis without hematuria: Secondary | ICD-10-CM

## 2014-11-28 MED ORDER — AMOXICILLIN 500 MG PO CAPS: ORAL_CAPSULE | ORAL | Status: DC

## 2014-11-28 NOTE — Telephone Encounter (Signed)
Patient called and stated that she has Congestion in her chest and before it turned into pneumonia. Patient wants to know if you could call in Amoxicillin to prevent this. Please Advise.

## 2014-11-28 NOTE — Telephone Encounter (Signed)
I spoke with Dr. Nyoka Cowden and he stated to call in patient Amoxicillin 500mg  One tablet three times daily for infection #21.  Patient Notified and agreed. Faxed to pharmacy.

## 2014-12-01 ENCOUNTER — Other Ambulatory Visit: Payer: Self-pay | Admitting: *Deleted

## 2014-12-01 MED ORDER — OMEPRAZOLE 40 MG PO CPDR: DELAYED_RELEASE_CAPSULE | ORAL | Status: DC

## 2014-12-01 NOTE — Telephone Encounter (Signed)
[place]

## 2014-12-05 ENCOUNTER — Other Ambulatory Visit: Payer: Self-pay | Admitting: *Deleted

## 2014-12-05 MED ORDER — HYDROCODONE-ACETAMINOPHEN 5-325 MG PO TABS: ORAL_TABLET | ORAL | Status: DC

## 2014-12-05 NOTE — Telephone Encounter (Signed)
Patient Requested and will pick up 

## 2014-12-17 ENCOUNTER — Other Ambulatory Visit: Payer: Self-pay | Admitting: *Deleted

## 2014-12-17 DIAGNOSIS — E1129 Type 2 diabetes mellitus with other diabetic kidney complication: Secondary | ICD-10-CM

## 2014-12-17 MED ORDER — INSULIN GLARGINE 100 UNIT/ML SOLOSTAR PEN: PEN_INJECTOR | SUBCUTANEOUS | Status: DC

## 2014-12-18 ENCOUNTER — Other Ambulatory Visit: Payer: Medicare Other

## 2014-12-18 ENCOUNTER — Telehealth: Payer: Self-pay | Admitting: *Deleted

## 2014-12-18 DIAGNOSIS — E1165 Type 2 diabetes mellitus with hyperglycemia: Principal | ICD-10-CM

## 2014-12-18 DIAGNOSIS — E1129 Type 2 diabetes mellitus with other diabetic kidney complication: Secondary | ICD-10-CM

## 2014-12-18 DIAGNOSIS — IMO0002 Reserved for concepts with insufficient information to code with codable children: Secondary | ICD-10-CM

## 2014-12-18 NOTE — Telephone Encounter (Signed)
Patient dropped off Patient Assistance Form For Lantus Solostar Pen. Filled out and given to Dr. Nyoka Cowden to review and sign. To be faxed back to [facility] #: [phone] Fax: [phone]

## 2014-12-19 LAB — COMPREHENSIVE METABOLIC PANEL
A/G RATIO: 1.8 (ref 1.1–2.5)
ALK PHOS: 96 IU/L (ref 39–117)
ALT: 22 IU/L (ref 0–32)
AST: 24 IU/L (ref 0–40)
Albumin: 4.4 g/dL (ref 3.5–4.7)
BUN / CREAT RATIO: 15 (ref 11–26)
BUN: 21 mg/dL (ref 8–27)
Bilirubin Total: 0.4 mg/dL (ref 0.0–1.2)
CO2: 26 mmol/L (ref 18–29)
Calcium: 8.9 mg/dL (ref 8.7–10.3)
Chloride: 94 mmol/L — ABNORMAL LOW (ref 97–108)
Creatinine, Ser: 1.4 mg/dL — ABNORMAL HIGH (ref 0.57–1.00)
GFR, EST AFRICAN AMERICAN: 39 mL/min/{1.73_m2} — AB (ref >59)
GFR, EST NON AFRICAN AMERICAN: 34 mL/min/{1.73_m2} — AB (ref >59)
Globulin, Total: 2.5 g/dL (ref 1.5–4.5)
Glucose: 199 mg/dL — ABNORMAL HIGH (ref 65–99)
Potassium: 5.3 mmol/L — ABNORMAL HIGH (ref 3.5–5.2)
SODIUM: 137 mmol/L (ref 134–144)
Total Protein: 6.9 g/dL (ref 6.0–8.5)

## 2014-12-19 LAB — MICROALBUMIN, URINE: Microalbumin, Urine: 21.8 ug/mL

## 2014-12-19 LAB — HEMOGLOBIN A1C
Est. average glucose Bld gHb Est-mCnc: 154 mg/dL
HEMOGLOBIN A1C: 7 % — AB (ref 4.8–5.6)

## 2014-12-22 ENCOUNTER — Other Ambulatory Visit: Payer: Medicare Other

## 2014-12-24 ENCOUNTER — Encounter: Payer: Self-pay | Admitting: Internal Medicine

## 2014-12-24 ENCOUNTER — Ambulatory Visit (INDEPENDENT_AMBULATORY_CARE_PROVIDER_SITE_OTHER): Payer: Medicare Other | Admitting: Internal Medicine

## 2014-12-24 VITALS — BP 150/76 | HR 107 | Temp 97.7°F | Resp 20 | Ht 65.0 in | Wt 137.2 lb

## 2014-12-24 DIAGNOSIS — E1129 Type 2 diabetes mellitus with other diabetic kidney complication: Secondary | ICD-10-CM | POA: Diagnosis not present

## 2014-12-24 DIAGNOSIS — R609 Edema, unspecified: Secondary | ICD-10-CM

## 2014-12-24 DIAGNOSIS — R000 Tachycardia, unspecified: Secondary | ICD-10-CM | POA: Diagnosis not present

## 2014-12-24 DIAGNOSIS — E1165 Type 2 diabetes mellitus with hyperglycemia: Secondary | ICD-10-CM | POA: Diagnosis not present

## 2014-12-24 DIAGNOSIS — I10 Essential (primary) hypertension: Secondary | ICD-10-CM

## 2014-12-24 DIAGNOSIS — E785 Hyperlipidemia, unspecified: Secondary | ICD-10-CM

## 2014-12-24 DIAGNOSIS — IMO0002 Reserved for concepts with insufficient information to code with codable children: Secondary | ICD-10-CM

## 2014-12-24 DIAGNOSIS — N183 Chronic kidney disease, stage 3 unspecified: Secondary | ICD-10-CM

## 2014-12-24 DIAGNOSIS — E039 Hypothyroidism, unspecified: Secondary | ICD-10-CM

## 2014-12-24 MED ORDER — METOPROLOL TARTRATE 50 MG PO TABS: ORAL_TABLET | ORAL | Status: DC

## 2014-12-24 MED ORDER — FUROSEMIDE 40 MG PO TABS: ORAL_TABLET | ORAL | Status: DC

## 2014-12-24 NOTE — Progress Notes (Signed)
Patient ID: Karen [name], female   DOB: 1923/08/31, 81 y.o.   MRN: [id number]    Facility  [facility]    Place of Service:   OFFICE    Allergies  Allergen Reactions  . Celebrex [Celecoxib] Rash  . Doxycycline Rash  . Lyrica [Pregabalin] Rash  . Avandia [Rosiglitazone]   . Macrodantin [Nitrofurantoin Macrocrystal]   . Sulfa Antibiotics   . Vioxx [Rofecoxib]   . Silicone Rash    Gets rash from the touch it.    Chief Complaint  Patient presents with  . Medical Management of Chronic Issues    3 month follow-up, Discuss labs ( copy printed ), has questions about her lasix    HPI:  Edema - complains of increasing edema of both feet and ankles.  Essential hypertension: Occasional modest elevations in SBP  Hypothyroidism, unspecified hypothyroidism type: compensated  Chronic kidney disease, stage III (moderate): Stable  Type 2 diabetes mellitus, uncontrolled, with renal complications: Stable  Tachycardia - as noted increasing instances of tachycardia  Osteoarthritis of the knees.: Previous injection was successful for over a week, then she fell and her knees began hurting more again. She does not think she needs another injection at this time.    Medications: Patient's Medications  New Prescriptions   No medications on file  Previous Medications   ALPRAZOLAM (XANAX) 0.5 MG TABLET    Take one tablet by mouth up to 3 times daily as needed for anxiety or sleep   AMOXICILLIN (AMOXIL) 500 MG CAPSULE    Take one capsule by mouth three times daily for infection   AMOXICILLIN-CLAVULANATE (AUGMENTIN) 875-125 MG PER TABLET    Take one tablet by mouth twice daily for infection   ASPIRIN EC 81 MG TABLET    One daily to help prevent stroke and heart attack   BLOOD GLUCOSE MONITORING SUPPL (BAYER BREEZE 2 SYSTEM) W/DEVICE KIT       CHLORPHENIRAMINE-HYDROCODONE (TUSSIONEX PENNKINETIC ER) 10-8 MG/5ML LQCR    5 cc every 12 hours to control cough   CRANBERRY PO    Take 300 mg by mouth  daily.   DESOXIMETASONE (TOPICORT) 0.25 % CREAM    Apply to lesion daily   DIGOXIN (LANOXIN) 0.125 MG TABLET    Take one tablet by mouth on Mondays,Wednedsdays,Fridays and Saturdays   DIPHENHYDRAMINE-ZINC ACETATE (BENADRYL ITCH STOPPING) CREAM    Apply 1 application topically 3 (three) times daily as needed.   ERGOCALCIFEROL (VITAMIN D2) 50000 UNITS CAPSULE    Take 1 capsule (50,000 Units total) by mouth once a week.   FLUCONAZOLE (DIFLUCAN) 150 MG TABLET    Take 1 tablet (150 mg total) by mouth once. If you develop yeast infection after antibiotics   FLUOCINONIDE CREAM (LIDEX) 0.05 %    Apply 1 application topically 2 (two) times daily.   FUROSEMIDE (LASIX) 40 MG TABLET    One each morning to control edema   GLUCOSE BLOOD (BAYER BREEZE 2 TEST VI)    Check blood sugar 3 x daily as directed. DX:250.02   HYDROCHLOROTHIAZIDE (HYDRODIURIL) 25 MG TABLET    Take 1 tablet (25 mg total) by mouth daily.   HYDROCODONE-ACETAMINOPHEN (NORCO/VICODIN) 5-325 MG PER TABLET    Take one tablet by mouth four times daily as needed for pain   INSULIN ASPART (NOVOLOG FLEXPEN) 100 UNIT/ML FLEXPEN    Inject into the skin 3 (three) times daily with meals. 10 units in am, 12 units @ lunch, 14 units @ dinner   INSULIN GLARGINE (  LANTUS SOLOSTAR) 100 UNIT/ML SOLOSTAR PEN    Inject 40 units at bed to control diabetes   INSULIN PEN NEEDLE 29G X 12.7MM MISC    Use as directed to administer insulin. DX: 250.40   INSULIN SYRINGE-NEEDLE U-100 (EASY COMFORT INSULIN SYRINGE) 30G X 1/2" 0.5 ML MISC    Use as Directed with insulin injections. Dx: 250.00   LANCET DEVICES (LANCING DEVICE) MISC       LEVOTHYROXINE (SYNTHROID, LEVOTHROID) 100 MCG TABLET    Take one tablet by mouth 30 minutes before breakfast for thyroid supplement   MOXIFLOXACIN (AVELOX) 400 MG TABLET    Take 1 tablet (400 mg total) by mouth daily.   OMEPRAZOLE (PRILOSEC) 40 MG CAPSULE    Take one capsule by mouth twice daily for stomach   ONDANSETRON (ZOFRAN) 4 MG TABLET     Take 1 tablet (4 mg total) by mouth every 4 (four) hours as needed for nausea or vomiting.   PROAIR HFA 108 (90 BASE) MCG/ACT INHALER       TIZANIDINE (ZANAFLEX) 4 MG CAPSULE    Take one tablet by mouth three times daily as needed to relax muscles.   TRIMETHOPRIM (TRIMPEX) 100 MG TABLET    Take one tablet by mouth every night at bedtime to prevent bladder infection   VITAMIN E PO    Take 400 Units by mouth daily.  Modified Medications   No medications on file  Discontinued Medications   No medications on file     Review of Systems  Constitutional: Positive for fatigue. Negative for fever, chills, activity change, appetite change and unexpected weight change.  HENT: Negative.   Eyes: Negative.   Respiratory: Positive for shortness of breath. Negative for wheezing.   Cardiovascular: Positive for leg swelling. Negative for chest pain and palpitations.       Bilateral mild peripheral arterial disease. She has been evaluated by Dr. Fabienne Bruns, vascular surgery, on 06/21/12. He felt that she had bilateral mild peripheral arterial disease. Leg discomfort seems to be more related to her chronic back problems. She has mild external iliac artery stenosis, but does not have severe occlusive disease. No surgical intervention is warranted.  Gastrointestinal:       Spastic colon. Intermittent diarrhea. Has more problems with chronic constipation.  Endocrine: Negative for polydipsia, polyphagia and polyuria.       Diabetic. History of thyroid problems. Thyroid has been removed. She is on supplements.  Genitourinary:       Urinary leakage and incontinence. Nocturia. Denies dysuria.  Musculoskeletal: Positive for back pain, joint swelling, arthralgias and gait problem.       Complaints of muscular weakness. Chronic backache. Arthritis in other joints. Unsteady gait. Bilateral lower leg discomfort. Bilateral knee pains Enlarged end of the right clavicle at the manubrium. Pain in the right shoulder  . Difficulty raising her arm. History of degenerative changes on Xray Dec. 2015.  Skin:       Seborrheic keratosis of the left breast. Generalized itching without rash.  Neurological: Positive for tremors.       Has difficulty with balance. There are episodes of dizziness. She experiences numbness in her feet. She walks wobbly and unsteady. His numbness and weakness in the left leg.  Hematological: Negative.   Psychiatric/Behavioral:       Chronic anxiety. Sleeps okay.    Filed Vitals:   12/24/14 1317  BP: 150/76  Pulse: 107  Temp: 97.7 F (36.5 C)  TempSrc: Oral  Resp: 20  Height: '5\' 5"'$  (1.651 m)  Weight: 137 lb 3.2 oz (62.234 kg)  SpO2: 95%   Body mass index is 22.83 kg/(m^2).  Physical Exam  Constitutional: She is oriented to person, place, and time.  Frail, thin, elderly female.  HENT:  Head: Normocephalic and atraumatic.  Mouth/Throat: No oropharyngeal exudate.  Eyes: Conjunctivae and EOM are normal. Pupils are equal, round, and reactive to light.  Neck: No JVD present. No tracheal deviation present.  Nuchal rigidity. History thyroidectomy.  Cardiovascular: Normal rate.  Exam reveals no gallop and no friction rub.   No murmur heard. Irregularly irregular rhythm. Rapid rate at 120. Diminished pedal pulses  Pulmonary/Chest: No respiratory distress. She has no wheezes. She has no rales.  Abdominal: Soft. Bowel sounds are normal. She exhibits no distension and no mass. There is no tenderness.  Musculoskeletal: She exhibits edema (2+ bipedal).  Stiff neck. Right shoulder discomfort on palpation. Unstable gait. Tender processes of the lumbar area. Bilateral lower paraspinal muscular tenderness. Bulbous, tender knees with increased stiffness and crepitus.  Lymphadenopathy:    She has no cervical adenopathy.  Neurological: She is alert and oriented to person, place, and time. No cranial nerve deficit. Coordination abnormal.  Difficulty with balance and with walking.  Skin:  No rash noted. No erythema. No pallor.  Seborrheic keratosis left breast.  Psychiatric: Her behavior is normal. Thought content normal.  Moderate anxiety.     Labs reviewed: Appointment on 12/18/2014  Component Date Value Ref Range Status  . Hgb A1c MFr Bld 12/18/2014 7.0* 4.8 - 5.6 % Final   Comment:          Pre-diabetes: 5.7 - 6.4          Diabetes: >6.4          Glycemic control for adults with diabetes: <7.0   . Est. average glucose Bld gHb Est-m* 12/18/2014 154   Final  . Glucose 12/18/2014 199* 65 - 99 mg/dL Final  . BUN 12/18/2014 21  8 - 27 mg/dL Final  . Creatinine, Ser 12/18/2014 1.40* 0.57 - 1.00 mg/dL Final  . GFR calc non Af Amer 12/18/2014 34* >59 mL/min/1.73 Final  . GFR calc Af Amer 12/18/2014 39* >59 mL/min/1.73 Final  . BUN/Creatinine Ratio 12/18/2014 15  11 - 26 Final  . Sodium 12/18/2014 137  134 - 144 mmol/L Final  . Potassium 12/18/2014 5.3* 3.5 - 5.2 mmol/L Final  . Chloride 12/18/2014 94* 97 - 108 mmol/L Final  . CO2 12/18/2014 26  18 - 29 mmol/L Final  . Calcium 12/18/2014 8.9  8.7 - 10.3 mg/dL Final  . Total Protein 12/18/2014 6.9  6.0 - 8.5 g/dL Final  . Albumin 12/18/2014 4.4  3.5 - 4.7 g/dL Final  . Globulin, Total 12/18/2014 2.5  1.5 - 4.5 g/dL Final  . Albumin/Globulin Ratio 12/18/2014 1.8  1.1 - 2.5 Final  . Bilirubin Total 12/18/2014 0.4  0.0 - 1.2 mg/dL Final  . Alkaline Phosphatase 12/18/2014 96  39 - 117 IU/L Final  . AST 12/18/2014 24  0 - 40 IU/L Final  . ALT 12/18/2014 22  0 - 32 IU/L Final  . Microalbum.,U,Random 12/18/2014 21.8  Not Estab. ug/mL Final                 **Please note reference interval change**  Abstract on 09/30/2014  Component Date Value Ref Range Status  . HM Diabetic Eye Exam 09/25/2014 No Retinopathy  No Retinopathy Final   [facility]  Office Visit  on 09/29/2014  Component Date Value Ref Range Status  . Color, UA 09/29/2014 YELLOW   Final  . Clarity, UA 09/29/2014 CLOUDY   Final  . Glucose, UA  09/29/2014 NEGATIVE   Final  . Bilirubin, UA 09/29/2014 NEGATIVE   Final  . Ketones, UA 09/29/2014 NEGATIVE   Final  . Spec Grav, UA 09/29/2014 1.015   Final  . Blood, UA 09/29/2014 TRACE   Final  . pH, UA 09/29/2014 5.0   Final  . Protein, UA 09/29/2014 TRACE   Final  . Urobilinogen, UA 09/29/2014 negative   Final  . Nitrite, UA 09/29/2014 POSITIVE   Final  . Leukocytes, UA 09/29/2014 moderate (2+)   Final  . Urine Culture, Routine 09/29/2014 Final report*  Final  . Result 1 09/29/2014 Escherichia coli*  Final   Greater than 100,000 colony forming units per mL  . ANTIMICROBIAL SUSCEPTIBILITY 09/29/2014 Comment   Final   Comment:       ** S = Susceptible; I = Intermediate; R = Resistant **                    P = Positive; N = Negative             MICS are expressed in micrograms per mL    Antibiotic                 RSLT#1    RSLT#2    RSLT#3    RSLT#4 Amoxicillin/Clavulanic Acid    S Ampicillin                     R Cefepime                       S Ceftriaxone                    S Cefuroxime                     S Cephalothin                    I Ciprofloxacin                  S Ertapenem                      S Gentamicin                     S Imipenem                       S Levofloxacin                   S Nitrofurantoin                 S Piperacillin                   R Tetracycline                   S Tobramycin                     S Trimethoprim/Sulfa             R    12/24/14 EKG: rate 107. AF Lateral ischemia or LVH  Assessment/Plan  Edema - Plan: furosemide (LASIX) 40 MG tablet  Essential hypertension: Continue  current medications  Hypothyroidism, unspecified hypothyroidism type: Continue current dose levothyroxin  Chronic kidney disease, stage III (moderate): Follow-up lab next visit  Type 2 diabetes mellitus, uncontrolled, with renal complications: Continue current medications and follow lab next visit  Tachycardia  - Plan add metoprolol (LOPRESSOR) 50 MG  tablet

## 2014-12-25 NOTE — Telephone Encounter (Signed)
Faxed paperwork yesterday 12/24/2014 and gave patient the original form at her appointment.

## 2015-01-01 ENCOUNTER — Ambulatory Visit: Payer: Medicare Other | Admitting: Podiatry

## 2015-01-02 ENCOUNTER — Telehealth: Payer: Self-pay | Admitting: *Deleted

## 2015-01-02 NOTE — Telephone Encounter (Signed)
[facility] [phone] Fax Form received and filled out for Diabetic Testing Supplies. Given to Dr. Nyoka Cowden to review and sign. To be faxed back to Fax#: [phone]

## 2015-01-05 DIAGNOSIS — E785 Hyperlipidemia, unspecified: Secondary | ICD-10-CM | POA: Insufficient documentation

## 2015-01-05 NOTE — Addendum Note (Signed)
Addended by: Marisa Cyphers C on: 01/05/2015 03:30 PM   Modules accepted: Orders

## 2015-01-05 NOTE — Telephone Encounter (Signed)
[employer] Approved Patient Connection Program through 08/01/2015. Will ship Lantus to our office for patient to pick up.  #: [phone]

## 2015-01-07 ENCOUNTER — Ambulatory Visit (INDEPENDENT_AMBULATORY_CARE_PROVIDER_SITE_OTHER): Payer: Medicare Other | Admitting: Internal Medicine

## 2015-01-07 ENCOUNTER — Encounter: Payer: Self-pay | Admitting: Internal Medicine

## 2015-01-07 VITALS — BP 148/70 | HR 55 | Temp 97.8°F | Ht 65.0 in | Wt 141.8 lb

## 2015-01-07 DIAGNOSIS — R609 Edema, unspecified: Secondary | ICD-10-CM | POA: Diagnosis not present

## 2015-01-07 DIAGNOSIS — E1129 Type 2 diabetes mellitus with other diabetic kidney complication: Secondary | ICD-10-CM

## 2015-01-07 DIAGNOSIS — R000 Tachycardia, unspecified: Secondary | ICD-10-CM | POA: Diagnosis not present

## 2015-01-07 DIAGNOSIS — I10 Essential (primary) hypertension: Secondary | ICD-10-CM

## 2015-01-07 DIAGNOSIS — E1165 Type 2 diabetes mellitus with hyperglycemia: Secondary | ICD-10-CM

## 2015-01-07 DIAGNOSIS — N183 Chronic kidney disease, stage 3 unspecified: Secondary | ICD-10-CM

## 2015-01-07 DIAGNOSIS — R300 Dysuria: Secondary | ICD-10-CM

## 2015-01-07 DIAGNOSIS — M48061 Spinal stenosis, lumbar region without neurogenic claudication: Secondary | ICD-10-CM

## 2015-01-07 DIAGNOSIS — M4806 Spinal stenosis, lumbar region: Secondary | ICD-10-CM | POA: Diagnosis not present

## 2015-01-07 DIAGNOSIS — IMO0002 Reserved for concepts with insufficient information to code with codable children: Secondary | ICD-10-CM

## 2015-01-07 LAB — POCT URINALYSIS DIPSTICK
Bilirubin, UA: NEGATIVE
Glucose, UA: NEGATIVE
Ketones, UA: NEGATIVE
NITRITE UA: NEGATIVE
PROTEIN UA: NEGATIVE
Spec Grav, UA: 1.015
Urobilinogen, UA: NEGATIVE
pH, UA: 6

## 2015-01-07 MED ORDER — CIPROFLOXACIN HCL 250 MG PO TABS: ORAL_TABLET | ORAL | Status: DC

## 2015-01-07 MED ORDER — HYDROCODONE-ACETAMINOPHEN 5-325 MG PO TABS: ORAL_TABLET | ORAL | Status: DC

## 2015-01-07 NOTE — Patient Instructions (Addendum)
Come to the lab prior to your next visit.

## 2015-01-07 NOTE — Progress Notes (Signed)
Patient ID: Karen [name], female   DOB: [birth year], 81 y.o.   MRN: [id number]    Facility  [facility]    Place of Service:   OFFICE    Allergies  Allergen Reactions  . Celebrex [Celecoxib] Rash  . Doxycycline Rash  . Lyrica [Pregabalin] Rash  . Avandia [Rosiglitazone]   . Macrodantin [Nitrofurantoin Macrocrystal]   . Sulfa Antibiotics   . Vioxx [Rofecoxib]   . Silicone Rash    Gets rash from the touch it.    Chief Complaint  Patient presents with  . Follow-up    2 week follow-up on elevated heartrate   . Urinary Tract Infection    Patient questions if she has a UTI, patient with chills, fever, and burning when urinating since Sunday.     HPI:  Dysuria started 01/04/15. NO hematuria. Nauseous. Headache. Afebrile.  She feels like her heart rhythm is calmer.   Medications: Patient's Medications  New Prescriptions   No medications on file  Previous Medications   ALPRAZOLAM (XANAX) 0.5 MG TABLET    Take one tablet by mouth up to 3 times daily as needed for anxiety or sleep   ASPIRIN EC 81 MG TABLET    One daily to help prevent stroke and heart attack   BLOOD GLUCOSE MONITORING SUPPL (BAYER BREEZE 2 SYSTEM) W/DEVICE KIT       CHLORPHENIRAMINE-HYDROCODONE (TUSSIONEX PENNKINETIC ER) 10-8 MG/5ML LQCR    5 cc every 12 hours to control cough   CRANBERRY PO    Take 300 mg by mouth daily.   DESOXIMETASONE (TOPICORT) 0.25 % CREAM    Apply to lesion daily   DIGOXIN (LANOXIN) 0.125 MG TABLET    Take one tablet by mouth on Mondays,Wednedsdays,Fridays and Saturdays   DIPHENHYDRAMINE-ZINC ACETATE (BENADRYL ITCH STOPPING) CREAM    Apply 1 application topically 3 (three) times daily as needed.   ERGOCALCIFEROL (VITAMIN D2) 50000 UNITS CAPSULE    Take 1 capsule (50,000 Units total) by mouth once a week.   FLUOCINONIDE CREAM (LIDEX) 0.05 %    Apply 1 application topically 2 (two) times daily.   FUROSEMIDE (LASIX) 40 MG TABLET    One daily to control edema   GLUCOSE BLOOD (BAYER BREEZE 2  TEST VI)    Check blood sugar 3 x daily as directed. DX:250.02   INSULIN ASPART (NOVOLOG FLEXPEN) 100 UNIT/ML FLEXPEN    Inject into the skin 3 (three) times daily with meals. 10 units in am, 12 units @ lunch, 14 units @ dinner   INSULIN GLARGINE (LANTUS SOLOSTAR) 100 UNIT/ML SOLOSTAR PEN    Inject 40 units at bed to control diabetes   INSULIN PEN NEEDLE 29G X 12.7MM MISC    Use as directed to administer insulin. DX: 250.40   INSULIN SYRINGE-NEEDLE U-100 (EASY COMFORT INSULIN SYRINGE) 30G X 1/2" 0.5 ML MISC    Use as Directed with insulin injections. Dx: 250.00   LANCET DEVICES (LANCING DEVICE) MISC       LEVOTHYROXINE (SYNTHROID, LEVOTHROID) 100 MCG TABLET    Take one tablet by mouth 30 minutes before breakfast for thyroid supplement   METOPROLOL (LOPRESSOR) 50 MG TABLET    One twice daily to control heart rate   MULTIPLE VITAMINS-MINERALS (PRESERVISION AREDS 2 PO)    Take by mouth. 1 by mouth two times daily   OMEPRAZOLE (PRILOSEC) 40 MG CAPSULE    Take one capsule by mouth twice daily for stomach   ONDANSETRON (ZOFRAN) 4 MG TABLET    Take  1 tablet (4 mg total) by mouth every 4 (four) hours as needed for nausea or vomiting.   PROAIR HFA 108 (90 BASE) MCG/ACT INHALER       TIZANIDINE (ZANAFLEX) 4 MG CAPSULE    Take one tablet by mouth three times daily as needed to relax muscles.   TRIMETHOPRIM (TRIMPEX) 100 MG TABLET    Take one tablet by mouth every night at bedtime to prevent bladder infection   VITAMIN E PO    Take 400 Units by mouth daily.  Modified Medications   Modified Medication Previous Medication   HYDROCODONE-ACETAMINOPHEN (NORCO/VICODIN) 5-325 MG PER TABLET HYDROcodone-acetaminophen (NORCO/VICODIN) 5-325 MG per tablet      Take one tablet by mouth four times daily as needed for pain    Take one tablet by mouth four times daily as needed for pain  Discontinued Medications   No medications on file     Review of Systems  Constitutional: Positive for chills and fatigue. Negative  for fever, activity change, appetite change and unexpected weight change.  Eyes: Negative.   Respiratory: Positive for shortness of breath. Negative for wheezing.   Cardiovascular: Positive for leg swelling. Negative for chest pain and palpitations.       Bilateral mild peripheral arterial disease. She has been evaluated by Dr. Ruta Hinds, vascular surgery, on 06/21/12. He felt that she had bilateral mild peripheral arterial disease. Leg discomfort seems to be more related to her chronic back problems. She has mild external iliac artery stenosis, but does not have severe occlusive disease. No surgical intervention is warranted.  Gastrointestinal: Positive for nausea.       Spastic colon. Intermittent diarrhea. Has more problems with chronic constipation.  Endocrine: Negative for polydipsia, polyphagia and polyuria.       Diabetic. History of thyroid problems. Thyroid has been removed. She is on supplements.  Genitourinary:       Urinary leakage and incontinence. Nocturia. Denies dysuria.  Musculoskeletal: Positive for back pain, joint swelling, arthralgias and gait problem.       Complaints of muscular weakness. Chronic backache. Arthritis in other joints. Unsteady gait. Bilateral lower leg discomfort. Bilateral knee pains Enlarged end of the right clavicle at the manubrium. Pain in the right shoulder . Difficulty raising her arm. History of degenerative changes on Xray Dec. 2015.  Skin:       Seborrheic keratosis of the left breast. Generalized itching without rash.  Neurological: Positive for tremors and headaches.       Has difficulty with balance. There are episodes of dizziness. She experiences numbness in her feet. She walks wobbly and unsteady. His numbness and weakness in the left leg.  Hematological: Negative.   Psychiatric/Behavioral:       Chronic anxiety. Sleeps okay.    Filed Vitals:   01/07/15 1145  BP: 148/70  Pulse: 55  Temp: 97.8 F (36.6 C)  TempSrc: Oral    Height: $Remove'5\' 5"'[id number]$  (1.651 m)  Weight: 141 lb 12.8 oz (64.32 kg)  SpO2: 95%   Body mass index is 23.6 kg/(m^2).  Physical Exam  Constitutional: She is oriented to person, place, and time.  Frail, thin, elderly female.  HENT:  Head: Normocephalic and atraumatic.  Mouth/Throat: No oropharyngeal exudate.  Eyes: Conjunctivae and EOM are normal. Pupils are equal, round, and reactive to light.  Neck: No JVD present. No tracheal deviation present.  Nuchal rigidity. History thyroidectomy.  Cardiovascular: Normal rate and regular rhythm.  Exam reveals no gallop and no friction rub.  No murmur heard. Rate 60 Diminished pedal pulses  Pulmonary/Chest: No respiratory distress. She has no wheezes. She has no rales.  Abdominal: Soft. Bowel sounds are normal. She exhibits no distension and no mass. There is no tenderness.  Musculoskeletal: She exhibits edema (2+ bipedal).  Stiff neck. Right shoulder discomfort on palpation. Unstable gait. Tender processes of the lumbar area. Bilateral lower paraspinal muscular tenderness. Bulbous, tender knees with increased stiffness and crepitus.  Lymphadenopathy:    She has no cervical adenopathy.  Neurological: She is alert and oriented to person, place, and time. No cranial nerve deficit. Coordination abnormal.  Difficulty with balance and with walking.  Skin: No rash noted. No erythema. No pallor.  Seborrheic keratosis left breast.  Psychiatric: Her behavior is normal. Thought content normal.  Moderate anxiety.     Labs reviewed: Office Visit on 01/07/2015  Component Date Value Ref Range Status  . Color, UA 01/07/2015 Light Yellow   Final  . Clarity, UA 01/07/2015 Cloudy   Final  . Glucose, UA 01/07/2015 Neg   Final  . Bilirubin, UA 01/07/2015 Neg   Final  . Ketones, UA 01/07/2015 Neg   Final  . Spec Grav, UA 01/07/2015 1.015   Final  . Blood, UA 01/07/2015 Trace   Final  . pH, UA 01/07/2015 6.0   Final  . Protein, UA 01/07/2015 Neg   Final  .  Urobilinogen, UA 01/07/2015 negative   Final  . Nitrite, UA 01/07/2015 Neg   Final  . Leukocytes, UA 01/07/2015 moderate (2+)   Final   Sent for culture   Appointment on 12/18/2014  Component Date Value Ref Range Status  . Hgb A1c MFr Bld 12/18/2014 7.0* 4.8 - 5.6 % Final   Comment:          Pre-diabetes: 5.7 - 6.4          Diabetes: >6.4          Glycemic control for adults with diabetes: <7.0   . Est. average glucose Bld gHb Est-m* 12/18/2014 154   Final  . Glucose 12/18/2014 199* 65 - 99 mg/dL Final  . BUN 12/18/2014 21  8 - 27 mg/dL Final  . Creatinine, Ser 12/18/2014 1.40* 0.57 - 1.00 mg/dL Final  . GFR calc non Af Amer 12/18/2014 34* >59 mL/min/1.73 Final  . GFR calc Af Amer 12/18/2014 39* >59 mL/min/1.73 Final  . BUN/Creatinine Ratio 12/18/2014 15  11 - 26 Final  . Sodium 12/18/2014 137  134 - 144 mmol/L Final  . Potassium 12/18/2014 5.3* 3.5 - 5.2 mmol/L Final  . Chloride 12/18/2014 94* 97 - 108 mmol/L Final  . CO2 12/18/2014 26  18 - 29 mmol/L Final  . Calcium 12/18/2014 8.9  8.7 - 10.3 mg/dL Final  . Total Protein 12/18/2014 6.9  6.0 - 8.5 g/dL Final  . Albumin 12/18/2014 4.4  3.5 - 4.7 g/dL Final  . Globulin, Total 12/18/2014 2.5  1.5 - 4.5 g/dL Final  . Albumin/Globulin Ratio 12/18/2014 1.8  1.1 - 2.5 Final  . Bilirubin Total 12/18/2014 0.4  0.0 - 1.2 mg/dL Final  . Alkaline Phosphatase 12/18/2014 96  39 - 117 IU/L Final  . AST 12/18/2014 24  0 - 40 IU/L Final  . ALT 12/18/2014 22  0 - 32 IU/L Final  . Microalbum.,U,Random 12/18/2014 21.8  Not Estab. ug/mL Final                 **Please note reference interval change**     Assessment/Plan  1. Dysuria - POCT urinalysis dipstick - Urine culture - ciprofloxacin (CIPRO) 250 MG tablet; Take one tablet twice daily for infection  Dispense: 14 tablet; Refill: 0  2. Tachycardia Improved. Rate down to 60.  3. Type 2 diabetes mellitus, uncontrolled, with renal complications Stable  4. Essential  hypertension Controlled   5. Chronic kidney disease, stage III (moderate) -CMP prior to next visit  6. Edema Controlled  7. Spinal stenosis of lumbar region Chronic low back pains

## 2015-01-09 LAB — URINE CULTURE

## 2015-01-12 ENCOUNTER — Telehealth: Payer: Self-pay | Admitting: *Deleted

## 2015-01-12 NOTE — Telephone Encounter (Signed)
Patient called and stated that she has Edema. Legs are swollen, tight and painful and can hardly get her shoes on. Patient is currently taking Lasix with no relief. Please Advise.

## 2015-01-12 NOTE — Telephone Encounter (Signed)
She needs to be seen.

## 2015-01-13 NOTE — Telephone Encounter (Signed)
Appointment Scheduled and patient agreed.

## 2015-01-14 ENCOUNTER — Encounter: Payer: Self-pay | Admitting: Internal Medicine

## 2015-01-14 ENCOUNTER — Ambulatory Visit (INDEPENDENT_AMBULATORY_CARE_PROVIDER_SITE_OTHER): Payer: Medicare Other | Admitting: Internal Medicine

## 2015-01-14 VITALS — BP 154/62 | HR 53 | Temp 97.5°F | Resp 20 | Ht 65.0 in | Wt 141.6 lb

## 2015-01-14 DIAGNOSIS — I10 Essential (primary) hypertension: Secondary | ICD-10-CM | POA: Diagnosis not present

## 2015-01-14 DIAGNOSIS — E1129 Type 2 diabetes mellitus with other diabetic kidney complication: Secondary | ICD-10-CM | POA: Diagnosis not present

## 2015-01-14 DIAGNOSIS — R609 Edema, unspecified: Secondary | ICD-10-CM | POA: Diagnosis not present

## 2015-01-14 DIAGNOSIS — E1165 Type 2 diabetes mellitus with hyperglycemia: Secondary | ICD-10-CM | POA: Diagnosis not present

## 2015-01-14 DIAGNOSIS — R110 Nausea: Secondary | ICD-10-CM

## 2015-01-14 DIAGNOSIS — IMO0002 Reserved for concepts with insufficient information to code with codable children: Secondary | ICD-10-CM

## 2015-01-14 NOTE — Patient Instructions (Signed)
Recommend you wear diabetic socks when at home to improve circulation and reduce swelling  Keep legs elevated when seated  Continue lasix for now. Will call with lab results.  Keep appt with Dr Nyoka Cowden  Edema Edema is an abnormal buildup of fluids. It is more common in your legs and thighs. Painless swelling of the feet and ankles is more likely as a person ages. It also is common in looser skin, like around your eyes. HOME CARE   Keep the affected body part above the level of the heart while lying down.  Do not sit still or stand for a long time.  Do not put anything right under your knees when you lie down.  Do not wear tight clothes on your upper legs.  Exercise your legs to help the puffiness (swelling) go down.  Wear elastic bandages or support stockings as told by your doctor.  A low-salt diet may help lessen the puffiness.  Only take medicine as told by your doctor. GET HELP IF:  Treatment is not working.  You have heart, liver, or kidney disease and notice that your skin looks puffy or shiny.  You have puffiness in your legs that does not get better when you raise your legs.  You have sudden weight gain for no reason. GET HELP RIGHT AWAY IF:   You have shortness of breath or chest pain.  You cannot breathe when you lie down.  You have pain, redness, or warmth in the areas that are puffy.  You have heart, liver, or kidney disease and get edema all of a sudden.  You have a fever and your symptoms get worse all of a sudden. MAKE SURE YOU:   Understand these instructions.  Will watch your condition.  Will get help right away if you are not doing well or get worse. Document Released: 01/04/2008 Document Revised: 07/23/2013 Document Reviewed: 05/10/2013 [facility] Patient Information 2015 [place], [place]. This information is not intended to replace advice given to you by your health care provider. Make sure you discuss any questions you have with your health care  provider.

## 2015-01-14 NOTE — Progress Notes (Signed)
Patient ID: Karen [name], female   DOB: 14-Jan-1924, 81 y.o.   MRN: [id number]    Location:    [facility]   Place of Service:   OFFICE  Chief Complaint  Patient presents with  . Acute Visit    swelling in legs x 1 week,    HPI:  81 yo female seen today for LE swelling x 4 days. She awakened with swelling and legs "feel like a rock". She is unable to wear certain shoes. Swelling actually a little better today. Lasix started by PCP 5/25th for swelling. She was tx for UTI last week with Cipro. Last labs in mid May revealed Cr 1.4.   Past Medical History  Diagnosis Date  . Anemia   . Stroke   . Irregular heartbeat   . Sleep apnea   . Arthritis     body, neck  . TIA (transient ischemic attack) 2012  . Bladder disorder     over active  . Cancer     skin, removed  . Hiatal hernia   . Benign hypertensive heart disease   . CHF (congestive heart failure)   . Vitamin B12 deficiency   . Gastroparesis   . Hyperlipidemia   . Anxiety   . A-fib   . Spinal stenosis   . GERD (gastroesophageal reflux disease)   . Esophageal stricture   . Peripheral vascular disease, unspecified   . Nausea alone   . Diarrhea   . Abnormal involuntary movements(781.0)   . Inflammatory disease of breast   . Transient ischemic attack (TIA), and cerebral infarction without residual deficits(V12.54)   . Urinary tract infection, site not specified   . Benign hypertensive heart disease with congestive heart failure   . Congestive heart failure, unspecified   . Nonspecific (abnormal) findings on radiological and other examination of skull and head   . Unspecified pruritic disorder   . Inflamed seborrheic keratosis   . Seborrheic keratosis   . Other B-complex deficiencies   . Chronic kidney disease, stage III (moderate)   . Type II or unspecified type diabetes mellitus with renal manifestations, not stated as uncontrolled   . Postmenopausal atrophic vaginitis   . Palpitations   . Mixed incontinence urge  and stress (female)(female)   . Contact dermatitis and other eczema, due to unspecified cause   . Unspecified vitamin D deficiency   . Corns and callosities   . Gastroparesis   . Other specified visual disturbances   . Spasm of muscle   . Edema   . Pain in joint, lower leg   . Other and unspecified hyperlipidemia   . Anxiety state, unspecified   . Unspecified hereditary and idiopathic peripheral neuropathy   . Unspecified constipation   . Unspecified essential hypertension   . Unspecified hypothyroidism   . Reflux esophagitis   . Other functional disorder of bladder   . Osteoarthrosis, unspecified whether generalized or localized, unspecified site   . Cervicalgia   . Spinal stenosis, unspecified region other than cervical   . Lumbago   . Unspecified sleep apnea   . Other malaise and fatigue   . Stricture and stenosis of esophagus     Past Surgical History  Procedure Laterality Date  . Back surgery      x 2  . Abdominal hysterectomy    . Thyroidectomy, partial  1965  . Cholecystectomy  1991  . Hammer toes    . Eye surgery  1994, 1997    bilateral cataract  .  Hernia repair  1984aaaaaaaa  . Skin cancer destruction    . Esophageal  2004,2006    dilation, Dr Lyla Son    Patient Care Team: Estill Dooms, MD as PCP - General (Internal Medicine) Leia Alf, DPM as Attending Physician (Podiatry)  History   Social History  . Marital Status: Married    Spouse Name: N/A  . Number of Children: 3  . Years of Education: N/A   Occupational History  . retired    Social History Main Topics  . Smoking status: Never Smoker   . Smokeless tobacco: Never Used  . Alcohol Use: No  . Drug Use: No  . Sexual Activity: Not on file   Other Topics Concern  . Not on file   Social History Narrative     reports that she has never smoked. She has never used smokeless tobacco. She reports that she does not drink alcohol or use illicit drugs.  Allergies  Allergen Reactions    . Celebrex [Celecoxib] Rash  . Doxycycline Rash  . Lyrica [Pregabalin] Rash  . Avandia [Rosiglitazone]   . Macrodantin [Nitrofurantoin Macrocrystal]   . Sulfa Antibiotics   . Vioxx [Rofecoxib]   . Silicone Rash    Gets rash from the touch it.    Medications: Patient's Medications  New Prescriptions   No medications on file  Previous Medications   ALPRAZOLAM (XANAX) 0.5 MG TABLET    Take one tablet by mouth up to 3 times daily as needed for anxiety or sleep   ASPIRIN EC 81 MG TABLET    One daily to help prevent stroke and heart attack   BLOOD GLUCOSE MONITORING SUPPL (BAYER BREEZE 2 SYSTEM) W/DEVICE KIT       CHLORPHENIRAMINE-HYDROCODONE (TUSSIONEX PENNKINETIC ER) 10-8 MG/5ML LQCR    5 cc every 12 hours to control cough   CIPROFLOXACIN (CIPRO) 250 MG TABLET    Take one tablet twice daily for infection   CRANBERRY PO    Take 300 mg by mouth daily.   DESOXIMETASONE (TOPICORT) 0.25 % CREAM    Apply to lesion daily   DIGOXIN (LANOXIN) 0.125 MG TABLET    Take one tablet by mouth on Mondays,Wednedsdays,Fridays and Saturdays   DIPHENHYDRAMINE-ZINC ACETATE (BENADRYL ITCH STOPPING) CREAM    Apply 1 application topically 3 (three) times daily as needed.   ERGOCALCIFEROL (VITAMIN D2) 50000 UNITS CAPSULE    Take 1 capsule (50,000 Units total) by mouth once a week.   FLUOCINONIDE CREAM (LIDEX) 0.05 %    Apply 1 application topically 2 (two) times daily.   FUROSEMIDE (LASIX) 40 MG TABLET    One daily to control edema   GLUCOSE BLOOD (BAYER BREEZE 2 TEST VI)    Check blood sugar 3 x daily as directed. DX:250.02   HYDROCODONE-ACETAMINOPHEN (NORCO/VICODIN) 5-325 MG PER TABLET    Take one tablet by mouth four times daily as needed for pain   INSULIN ASPART (NOVOLOG FLEXPEN) 100 UNIT/ML FLEXPEN    Inject into the skin 3 (three) times daily with meals. 10 units in am, 12 units @ lunch, 14 units @ dinner   INSULIN GLARGINE (LANTUS SOLOSTAR) 100 UNIT/ML SOLOSTAR PEN    Inject 40 units at bed to control  diabetes   INSULIN PEN NEEDLE 29G X 12.7MM MISC    Use as directed to administer insulin. DX: 250.40   INSULIN SYRINGE-NEEDLE U-100 (EASY COMFORT INSULIN SYRINGE) 30G X 1/2" 0.5 ML MISC    Use as Directed with insulin injections.  Dx: 250.00   LANCET DEVICES (LANCING DEVICE) MISC       LEVOTHYROXINE (SYNTHROID, LEVOTHROID) 100 MCG TABLET    Take one tablet by mouth 30 minutes before breakfast for thyroid supplement   METOPROLOL (LOPRESSOR) 50 MG TABLET    One twice daily to control heart rate   MULTIPLE VITAMINS-MINERALS (PRESERVISION AREDS 2 PO)    Take by mouth. 1 by mouth two times daily   OMEPRAZOLE (PRILOSEC) 40 MG CAPSULE    Take one capsule by mouth twice daily for stomach   ONDANSETRON (ZOFRAN) 4 MG TABLET    Take 1 tablet (4 mg total) by mouth every 4 (four) hours as needed for nausea or vomiting.   PROAIR HFA 108 (90 BASE) MCG/ACT INHALER       TIZANIDINE (ZANAFLEX) 4 MG CAPSULE    Take one tablet by mouth three times daily as needed to relax muscles.   TRIMETHOPRIM (TRIMPEX) 100 MG TABLET    Take one tablet by mouth every night at bedtime to prevent bladder infection   VITAMIN E PO    Take 400 Units by mouth daily.  Modified Medications   No medications on file  Discontinued Medications   No medications on file    Review of Systems  Respiratory: Positive for cough, chest tightness and shortness of breath.   Cardiovascular: Positive for chest pain (with cough) and leg swelling.  Gastrointestinal: Positive for nausea.  Genitourinary: Negative for dysuria, urgency and difficulty urinating.  All other systems reviewed and are negative.   Filed Vitals:   01/14/15 1004  BP: 154/62  Pulse: 53  Temp: 97.5 F (36.4 C)  TempSrc: Oral  Resp: 20  Height: $Remove'5\' 5"'IWgObsS$  (1.651 m)  Weight: 141 lb 9.6 oz (64.229 kg)  SpO2: 92%   Body mass index is 23.56 kg/(m^2).  Physical Exam  Constitutional: She is oriented to person, place, and time. She appears well-developed and well-nourished. No  distress.  Frail appearing in NAD  HENT:  Mouth/Throat: Oropharynx is clear and moist. No oropharyngeal exudate.  Eyes: Pupils are equal, round, and reactive to light. No scleral icterus.  Neck: Neck supple. Carotid bruit is not present. No tracheal deviation present. No thyromegaly present.  Cardiovascular: Regular rhythm, normal heart sounds and intact distal pulses.  Bradycardia present.  Exam reveals no gallop and no friction rub.   No murmur heard. +1 pitting LE edema b/l. + calf TTP.  (+) soft varicose veins and spider veins.  Pulmonary/Chest: Effort normal and breath sounds normal. No stridor. No respiratory distress. She has no wheezes. She has no rales.  Abdominal: Soft. Bowel sounds are normal. She exhibits no distension and no mass. There is no tenderness. There is no rebound and no guarding.  Musculoskeletal: She exhibits edema and tenderness.  Lymphadenopathy:    She has no cervical adenopathy.  Neurological: She is alert and oriented to person, place, and time.  Skin: Skin is warm and dry. No rash noted.  Psychiatric: She has a normal mood and affect. Her behavior is normal. Judgment and thought content normal.     Labs reviewed: Office Visit on 01/07/2015  Component Date Value Ref Range Status  . Color, UA 01/07/2015 Light Yellow   Final  . Clarity, UA 01/07/2015 Cloudy   Final  . Glucose, UA 01/07/2015 Neg   Final  . Bilirubin, UA 01/07/2015 Neg   Final  . Ketones, UA 01/07/2015 Neg   Final  . Spec Grav, UA 01/07/2015 1.015   Final  .  Blood, UA 01/07/2015 Trace   Final  . pH, UA 01/07/2015 6.0   Final  . Protein, UA 01/07/2015 Neg   Final  . Urobilinogen, UA 01/07/2015 negative   Final  . Nitrite, UA 01/07/2015 Neg   Final  . Leukocytes, UA 01/07/2015 moderate (2+)   Final   Sent for culture   . Urine Culture, Routine 01/07/2015 Final report*  Final  . Result 1 01/07/2015 Escherichia coli*  Final   Greater than 100,000 colony forming units per mL  .  ANTIMICROBIAL SUSCEPTIBILITY 01/07/2015 Comment   Final   Comment:       ** S = Susceptible; I = Intermediate; R = Resistant **                    P = Positive; N = Negative             MICS are expressed in micrograms per mL    Antibiotic                 RSLT#1    RSLT#2    RSLT#3    RSLT#4 Amoxicillin/Clavulanic Acid    S Ampicillin                     S Cefepime                       S Ceftriaxone                    S Cefuroxime                     S Cephalothin                    I Ciprofloxacin                  S Ertapenem                      S Gentamicin                     S Imipenem                       S Levofloxacin                   S Nitrofurantoin                 S Piperacillin                   S Tetracycline                   S Tobramycin                     S Trimethoprim/Sulfa             R   Appointment on 12/18/2014  Component Date Value Ref Range Status  . Hgb A1c MFr Bld 12/18/2014 7.0* 4.8 - 5.6 % Final   Comment:          Pre-diabetes: 5.7 - 6.4          Diabetes: >6.4          Glycemic control for adults with diabetes: <7.0   . Est. average glucose Bld gHb Est-m* 12/18/2014 154   Final  . Glucose 12/18/2014 199*  65 - 99 mg/dL Final  . BUN 12/18/2014 21  8 - 27 mg/dL Final  . Creatinine, Ser 12/18/2014 1.40* 0.57 - 1.00 mg/dL Final  . GFR calc non Af Amer 12/18/2014 34* >59 mL/min/1.73 Final  . GFR calc Af Amer 12/18/2014 39* >59 mL/min/1.73 Final  . BUN/Creatinine Ratio 12/18/2014 15  11 - 26 Final  . Sodium 12/18/2014 137  134 - 144 mmol/L Final  . Potassium 12/18/2014 5.3* 3.5 - 5.2 mmol/L Final  . Chloride 12/18/2014 94* 97 - 108 mmol/L Final  . CO2 12/18/2014 26  18 - 29 mmol/L Final  . Calcium 12/18/2014 8.9  8.7 - 10.3 mg/dL Final  . Total Protein 12/18/2014 6.9  6.0 - 8.5 g/dL Final  . Albumin 12/18/2014 4.4  3.5 - 4.7 g/dL Final  . Globulin, Total 12/18/2014 2.5  1.5 - 4.5 g/dL Final  . Albumin/Globulin Ratio 12/18/2014 1.8  1.1 - 2.5  Final  . Bilirubin Total 12/18/2014 0.4  0.0 - 1.2 mg/dL Final  . Alkaline Phosphatase 12/18/2014 96  39 - 117 IU/L Final  . AST 12/18/2014 24  0 - 40 IU/L Final  . ALT 12/18/2014 22  0 - 32 IU/L Final  . Microalbum.,U,Random 12/18/2014 21.8  Not Estab. ug/mL Final                 **Please note reference interval change**    No results found.   Assessment/Plan    ICD-9-CM ICD-10-CM   1. Edema - unchanged 782.3 G49.3 Basic Metabolic Panel  2. Type 2 diabetes mellitus, uncontrolled, with renal complications - stable 241.99 E11.29     E11.65   3. Essential hypertension - suboptimal today 401.9 I10   4. Nausea without vomiting - probably related to recent abx use 787.02 R11.0    --Recommend you wear diabetic socks when at home to improve circulation and reduce swelling  --pt education handout on edema given  --Keep legs elevated when seated  --Continue lasix for now. Will call with lab results.  --Keep appt with Dr Ledell Noss. Perlie Gold  [facility] [address] [place], [place] [address] [phone] Cell (Monday-Friday 8 AM - 5 PM) [phone] After 5 PM and follow prompts

## 2015-01-15 LAB — BASIC METABOLIC PANEL
BUN / CREAT RATIO: 15 (ref 11–26)
BUN: 25 mg/dL (ref 8–27)
CHLORIDE: 95 mmol/L — AB (ref 97–108)
CO2: 28 mmol/L (ref 18–29)
Calcium: 9 mg/dL (ref 8.7–10.3)
Creatinine, Ser: 1.67 mg/dL — ABNORMAL HIGH (ref 0.57–1.00)
GFR calc non Af Amer: 27 mL/min/{1.73_m2} — ABNORMAL LOW (ref >59)
GFR, EST AFRICAN AMERICAN: 31 mL/min/{1.73_m2} — AB (ref >59)
Glucose: 84 mg/dL (ref 65–99)
POTASSIUM: 4.8 mmol/L (ref 3.5–5.2)
SODIUM: 140 mmol/L (ref 134–144)

## 2015-01-19 ENCOUNTER — Telehealth: Payer: Self-pay

## 2015-01-19 MED ORDER — LEVOFLOXACIN 250 MG PO TABS: ORAL_TABLET | ORAL | Status: DC

## 2015-01-19 NOTE — Telephone Encounter (Signed)
Called patient, to call Levaquin into [facility]

## 2015-01-21 ENCOUNTER — Encounter: Payer: Self-pay | Admitting: Internal Medicine

## 2015-02-09 ENCOUNTER — Other Ambulatory Visit: Payer: Self-pay | Admitting: *Deleted

## 2015-02-09 MED ORDER — HYDROCODONE-ACETAMINOPHEN 5-325 MG PO TABS: ORAL_TABLET | ORAL | Status: DC

## 2015-02-09 NOTE — Telephone Encounter (Signed)
Received fax from [facility]. Refill printed and patient will pick up

## 2015-02-16 ENCOUNTER — Other Ambulatory Visit: Payer: Medicare Other

## 2015-02-17 ENCOUNTER — Other Ambulatory Visit: Payer: Medicare Other

## 2015-02-17 DIAGNOSIS — E1165 Type 2 diabetes mellitus with hyperglycemia: Secondary | ICD-10-CM | POA: Diagnosis not present

## 2015-02-17 DIAGNOSIS — N183 Chronic kidney disease, stage 3 unspecified: Secondary | ICD-10-CM

## 2015-02-17 DIAGNOSIS — I10 Essential (primary) hypertension: Secondary | ICD-10-CM

## 2015-02-17 DIAGNOSIS — E785 Hyperlipidemia, unspecified: Secondary | ICD-10-CM

## 2015-02-17 DIAGNOSIS — IMO0002 Reserved for concepts with insufficient information to code with codable children: Secondary | ICD-10-CM

## 2015-02-17 DIAGNOSIS — E039 Hypothyroidism, unspecified: Secondary | ICD-10-CM | POA: Diagnosis not present

## 2015-02-17 DIAGNOSIS — E1129 Type 2 diabetes mellitus with other diabetic kidney complication: Secondary | ICD-10-CM

## 2015-02-18 ENCOUNTER — Ambulatory Visit (INDEPENDENT_AMBULATORY_CARE_PROVIDER_SITE_OTHER): Payer: Medicare Other | Admitting: Internal Medicine

## 2015-02-18 ENCOUNTER — Encounter: Payer: Self-pay | Admitting: Internal Medicine

## 2015-02-18 VITALS — BP 134/78 | HR 54 | Temp 96.9°F | Ht 65.0 in | Wt 136.0 lb

## 2015-02-18 DIAGNOSIS — E1165 Type 2 diabetes mellitus with hyperglycemia: Secondary | ICD-10-CM

## 2015-02-18 DIAGNOSIS — E785 Hyperlipidemia, unspecified: Secondary | ICD-10-CM | POA: Diagnosis not present

## 2015-02-18 DIAGNOSIS — R0600 Dyspnea, unspecified: Secondary | ICD-10-CM

## 2015-02-18 DIAGNOSIS — E039 Hypothyroidism, unspecified: Secondary | ICD-10-CM | POA: Diagnosis not present

## 2015-02-18 DIAGNOSIS — M79671 Pain in right foot: Secondary | ICD-10-CM

## 2015-02-18 DIAGNOSIS — N183 Chronic kidney disease, stage 3 unspecified: Secondary | ICD-10-CM

## 2015-02-18 DIAGNOSIS — F419 Anxiety disorder, unspecified: Secondary | ICD-10-CM | POA: Diagnosis not present

## 2015-02-18 DIAGNOSIS — E1129 Type 2 diabetes mellitus with other diabetic kidney complication: Secondary | ICD-10-CM | POA: Diagnosis not present

## 2015-02-18 DIAGNOSIS — R1310 Dysphagia, unspecified: Secondary | ICD-10-CM | POA: Diagnosis not present

## 2015-02-18 DIAGNOSIS — I10 Essential (primary) hypertension: Secondary | ICD-10-CM | POA: Diagnosis not present

## 2015-02-18 DIAGNOSIS — IMO0002 Reserved for concepts with insufficient information to code with codable children: Secondary | ICD-10-CM

## 2015-02-18 HISTORY — DX: Dyspnea, unspecified: R06.00

## 2015-02-18 LAB — BASIC METABOLIC PANEL
BUN / CREAT RATIO: 15 (ref 11–26)
BUN: 22 mg/dL (ref 8–27)
CO2: 26 mmol/L (ref 18–29)
Calcium: 9 mg/dL (ref 8.7–10.3)
Chloride: 99 mmol/L (ref 97–108)
Creatinine, Ser: 1.44 mg/dL — ABNORMAL HIGH (ref 0.57–1.00)
GFR calc Af Amer: 37 mL/min/{1.73_m2} — ABNORMAL LOW (ref >59)
GFR calc non Af Amer: 32 mL/min/{1.73_m2} — ABNORMAL LOW (ref >59)
Glucose: 75 mg/dL (ref 65–99)
Potassium: 4.3 mmol/L (ref 3.5–5.2)
SODIUM: 143 mmol/L (ref 134–144)

## 2015-02-18 LAB — LIPID PANEL
CHOLESTEROL TOTAL: 153 mg/dL (ref 100–199)
Chol/HDL Ratio: 5.1 ratio units — ABNORMAL HIGH (ref 0.0–4.4)
HDL: 30 mg/dL — ABNORMAL LOW (ref >39)
LDL CALC: 91 mg/dL (ref 0–99)
Triglycerides: 160 mg/dL — ABNORMAL HIGH (ref 0–149)
VLDL CHOLESTEROL CAL: 32 mg/dL (ref 5–40)

## 2015-02-18 LAB — TSH: TSH: 4.15 u[IU]/mL (ref 0.450–4.500)

## 2015-02-18 LAB — HEMOGLOBIN A1C
Est. average glucose Bld gHb Est-mCnc: 177 mg/dL
Hgb A1c MFr Bld: 7.8 % — ABNORMAL HIGH (ref 4.8–5.6)

## 2015-02-18 MED ORDER — UREA 20 % EX CREA: TOPICAL_CREAM | CUTANEOUS | Status: DC

## 2015-02-18 MED ORDER — ALPRAZOLAM 0.5 MG PO TABS: ORAL_TABLET | ORAL | Status: DC

## 2015-02-18 NOTE — Progress Notes (Signed)
Patient ID: Karen [name], female   DOB: [birth year], 81 y.o.   MRN: [id number]    Facility  [facility]    Place of Service:   OFFICE    Allergies  Allergen Reactions  . Celebrex [Celecoxib] Rash  . Doxycycline Rash  . Lyrica [Pregabalin] Rash  . Avandia [Rosiglitazone]   . Macrodantin [Nitrofurantoin Macrocrystal]   . Sulfa Antibiotics   . Vioxx [Rofecoxib]   . Silicone Rash    Gets rash from the touch it.    Chief Complaint  Patient presents with  . Medical Management of Chronic Issues    Medical Management of Chronic Issues. 6 Week follow up, Complains of Right foot hurting/throbbing    HPI:  Pain of right heel: Pain in the right heel. History of a decubitus ulcer 8 years ago. Now so painful that she cannot stand bedsheets on the heel. Dense callous and pressure erythema in the area.      Essential hypertension  Type 2 diabetes mellitus, uncontrolled, with renal complications: Although her latest fasting glucose was 75 mg percent, the A1c was 7.8%. She denies any hypoglycemic episodes. She continues to use Lantus 40 units at bedtime as well as NovoLog 3 times daily with meals: 10 units at breakfast, 12 units at lunch, 14 units at dinner.  Chronic kidney disease, stage III (moderate): Unchanged  Hypothyroidism, unspecified hypothyroidism type: Compensated  Dysphagia: Is under increasing problems with swallowing. Food hangs in the chest. Had EGD and dilation in March 2014 by Dr. Henrene Pastor. According to the patient, she reports that she was told that Dr. Henrene Pastor did not want to do this procedure on her anymore. She is not clear why.  Hyperlipidemia: Controlled  Dyspnea: On exertion. Unchanged.    Medications: Patient's Medications  New Prescriptions   No medications on file  Previous Medications   ALPRAZOLAM (XANAX) 0.5 MG TABLET    Take one tablet by mouth up to 3 times daily as needed for anxiety or sleep   ASPIRIN EC 81 MG TABLET    One daily to help prevent stroke  and heart attack   BLOOD GLUCOSE MONITORING SUPPL (BAYER BREEZE 2 SYSTEM) W/DEVICE KIT       CHLORPHENIRAMINE-HYDROCODONE (TUSSIONEX PENNKINETIC ER) 10-8 MG/5ML LQCR    5 cc every 12 hours to control cough   CRANBERRY PO    Take 300 mg by mouth daily.   DESOXIMETASONE (TOPICORT) 0.25 % CREAM    Apply to lesion daily   DIGOXIN (LANOXIN) 0.125 MG TABLET    Take one tablet by mouth on Mondays,Wednedsdays,Fridays and Saturdays   DIPHENHYDRAMINE-ZINC ACETATE (BENADRYL ITCH STOPPING) CREAM    Apply 1 application topically 3 (three) times daily as needed.   ERGOCALCIFEROL (VITAMIN D2) 50000 UNITS CAPSULE    Take 1 capsule (50,000 Units total) by mouth once a week.   FLUOCINONIDE CREAM (LIDEX) 0.05 %    Apply 1 application topically 2 (two) times daily.   FUROSEMIDE (LASIX) 40 MG TABLET    One daily to control edema   GLUCOSE BLOOD (BAYER BREEZE 2 TEST VI)    Check blood sugar 3 x daily as directed. DX:250.02   HYDROCODONE-ACETAMINOPHEN (NORCO/VICODIN) 5-325 MG PER TABLET    Take one tablet by mouth four times daily as needed for pain   INSULIN ASPART (NOVOLOG FLEXPEN) 100 UNIT/ML FLEXPEN    Inject into the skin 3 (three) times daily with meals. 10 units in am, 12 units @ lunch, 14 units @ dinner   INSULIN  GLARGINE (LANTUS SOLOSTAR) 100 UNIT/ML SOLOSTAR PEN    Inject 40 units at bed to control diabetes   INSULIN PEN NEEDLE 29G X 12.7MM MISC    Use as directed to administer insulin. DX: 250.40   INSULIN SYRINGE-NEEDLE U-100 (EASY COMFORT INSULIN SYRINGE) 30G X 1/2" 0.5 ML MISC    Use as Directed with insulin injections. Dx: 250.00   LANCET DEVICES (LANCING DEVICE) MISC       LEVOTHYROXINE (SYNTHROID, LEVOTHROID) 100 MCG TABLET    Take one tablet by mouth 30 minutes before breakfast for thyroid supplement   METOPROLOL (LOPRESSOR) 50 MG TABLET    One twice daily to control heart rate   MULTIPLE VITAMINS-MINERALS (PRESERVISION AREDS 2 PO)    Take by mouth. 1 by mouth two times daily   OMEPRAZOLE (PRILOSEC)  40 MG CAPSULE    Take one capsule by mouth twice daily for stomach   ONDANSETRON (ZOFRAN) 4 MG TABLET    Take 1 tablet (4 mg total) by mouth every 4 (four) hours as needed for nausea or vomiting.   PROAIR HFA 108 (90 BASE) MCG/ACT INHALER       TIZANIDINE (ZANAFLEX) 4 MG CAPSULE    Take one tablet by mouth three times daily as needed to relax muscles.   TRIMETHOPRIM (TRIMPEX) 100 MG TABLET    Take one tablet by mouth every night at bedtime to prevent bladder infection   VITAMIN E PO    Take 400 Units by mouth daily.  Modified Medications   No medications on file  Discontinued Medications   CIPROFLOXACIN (CIPRO) 250 MG TABLET    Take one tablet twice daily for infection   LEVOFLOXACIN (LEVAQUIN) 250 MG TABLET    Take one tablet daily for urinary infection     Review of Systems  Constitutional: Positive for chills and fatigue. Negative for fever, activity change, appetite change and unexpected weight change.  Eyes: Negative.   Respiratory: Positive for shortness of breath (on exertion). Negative for wheezing.   Cardiovascular: Positive for leg swelling. Negative for chest pain and palpitations.       Bilateral mild peripheral arterial disease. She has been evaluated by Dr. Ruta Hinds, vascular surgery, on 06/21/12. He felt that she had bilateral mild peripheral arterial disease. Leg discomfort seems to be more related to her chronic back problems. She has mild external iliac artery stenosis, but does not have severe occlusive disease. No surgical intervention is warranted.  Gastrointestinal: Positive for nausea.       Spastic colon. Intermittent diarrhea. Has more problems with chronic constipation.  Endocrine: Negative for polydipsia, polyphagia and polyuria.       Diabetic. History of thyroid problems. Thyroid has been removed. She is on supplements.  Genitourinary:       Urinary leakage and incontinence. Nocturia. Denies dysuria.  Musculoskeletal: Positive for back pain, joint  swelling, arthralgias and gait problem.       Complaints of muscular weakness. Chronic backache. Arthritis in other joints. Unsteady gait. Bilateral lower leg discomfort. Bilateral knee pains Enlarged end of the right clavicle at the manubrium. Pain in the right shoulder . Difficulty raising her arm. History of degenerative changes on Xray Dec. 2015.  Skin:       Seborrheic keratosis of the left breast. Generalized itching without rash.  Neurological: Positive for tremors and headaches.       Has difficulty with balance. There are episodes of dizziness. She experiences numbness in her feet. She walks wobbly and unsteady. His  numbness and weakness in the left leg.  Hematological: Negative.   Psychiatric/Behavioral:       Chronic anxiety. Sleeps okay.    Filed Vitals:   02/18/15 1227  BP: 134/78  Pulse: 54  Temp: 96.9 F (36.1 C)  TempSrc: Oral  Height: $Remove'5\' 5"'LzvVEPW$  (1.651 m)  Weight: 136 lb (61.689 kg)   Body mass index is 22.63 kg/(m^2).  Physical Exam  Constitutional: She is oriented to person, place, and time.  Frail, thin, elderly female.  HENT:  Head: Normocephalic and atraumatic.  Mouth/Throat: No oropharyngeal exudate.  Eyes: Conjunctivae and EOM are normal. Pupils are equal, round, and reactive to light.  Neck: No JVD present. No tracheal deviation present.  Nuchal rigidity. History thyroidectomy.  Cardiovascular: Normal rate and regular rhythm.  Exam reveals no gallop and no friction rub.   No murmur heard. Rate 60 Diminished pedal pulses  Pulmonary/Chest: No respiratory distress. She has no wheezes. She has no rales.  Abdominal: Soft. Bowel sounds are normal. She exhibits no distension and no mass. There is no tenderness.  Musculoskeletal: She exhibits edema (2+ bipedal).  Stiff neck. Right shoulder discomfort on palpation. Unstable gait. Tender processes of the lumbar area. Bilateral lower paraspinal muscular tenderness. Bulbous, tender knees with increased stiffness  and crepitus.  Lymphadenopathy:    She has no cervical adenopathy.  Neurological: She is alert and oriented to person, place, and time. No cranial nerve deficit. Coordination abnormal.  Difficulty with balance and with walking.  Skin: No rash noted. No erythema. No pallor.  Seborrheic keratosis left breast.  Psychiatric: Her behavior is normal. Thought content normal.  Moderate anxiety.     Labs reviewed: Appointment on 02/17/2015  Component Date Value Ref Range Status  . TSH 02/17/2015 4.150  0.450 - 4.500 uIU/mL Final  . Glucose 02/17/2015 75  65 - 99 mg/dL Final  . BUN 02/17/2015 22  8 - 27 mg/dL Final  . Creatinine, Ser 02/17/2015 1.44* 0.57 - 1.00 mg/dL Final  . GFR calc non Af Amer 02/17/2015 32* >59 mL/min/1.73 Final  . GFR calc Af Amer 02/17/2015 37* >59 mL/min/1.73 Final  . BUN/Creatinine Ratio 02/17/2015 15  11 - 26 Final  . Sodium 02/17/2015 143  134 - 144 mmol/L Final  . Potassium 02/17/2015 4.3  3.5 - 5.2 mmol/L Final  . Chloride 02/17/2015 99  97 - 108 mmol/L Final  . CO2 02/17/2015 26  18 - 29 mmol/L Final  . Calcium 02/17/2015 9.0  8.7 - 10.3 mg/dL Final  . Cholesterol, Total 02/17/2015 153  100 - 199 mg/dL Final  . Triglycerides 02/17/2015 160* 0 - 149 mg/dL Final  . HDL 02/17/2015 30* >39 mg/dL Final   Comment: According to ATP-III Guidelines, HDL-C >59 mg/dL is considered a negative risk factor for CHD.   Marland Kitchen VLDL Cholesterol Cal 02/17/2015 32  5 - 40 mg/dL Final  . LDL Calculated 02/17/2015 91  0 - 99 mg/dL Final  . Chol/HDL Ratio 02/17/2015 5.1* 0.0 - 4.4 ratio units Final   Comment:                                   T. Chol/HDL Ratio  Men  Women                               1/2 Avg.Risk  3.4    3.3                                   Avg.Risk  5.0    4.4                                2X Avg.Risk  9.6    7.1                                3X Avg.Risk 23.4   11.0   . Hgb A1c MFr Bld 02/17/2015 7.8* 4.8 - 5.6 %  Final   Comment:          Pre-diabetes: 5.7 - 6.4          Diabetes: >6.4          Glycemic control for adults with diabetes: <7.0   . Est. average glucose Bld gHb Est-m* 02/17/2015 177   Final  Office Visit on 01/14/2015  Component Date Value Ref Range Status  . Glucose 01/14/2015 84  65 - 99 mg/dL Final  . BUN 86/77/2109 25  8 - 27 mg/dL Final  . Creatinine, Ser 01/14/2015 1.67* 0.57 - 1.00 mg/dL Final  . GFR calc non Af Amer 01/14/2015 27* >59 mL/min/1.73 Final  . GFR calc Af Amer 01/14/2015 31* >59 mL/min/1.73 Final  . BUN/Creatinine Ratio 01/14/2015 15  11 - 26 Final  . Sodium 01/14/2015 140  134 - 144 mmol/L Final  . Potassium 01/14/2015 4.8  3.5 - 5.2 mmol/L Final  . Chloride 01/14/2015 95* 97 - 108 mmol/L Final  . CO2 01/14/2015 28  18 - 29 mmol/L Final  . Calcium 01/14/2015 9.0  8.7 - 10.3 mg/dL Final  Office Visit on 01/07/2015  Component Date Value Ref Range Status  . Color, UA 01/07/2015 Light Yellow   Final  . Clarity, UA 01/07/2015 Cloudy   Final  . Glucose, UA 01/07/2015 Neg   Final  . Bilirubin, UA 01/07/2015 Neg   Final  . Ketones, UA 01/07/2015 Neg   Final  . Spec Grav, UA 01/07/2015 1.015   Final  . Blood, UA 01/07/2015 Trace   Final  . pH, UA 01/07/2015 6.0   Final  . Protein, UA 01/07/2015 Neg   Final  . Urobilinogen, UA 01/07/2015 negative   Final  . Nitrite, UA 01/07/2015 Neg   Final  . Leukocytes, UA 01/07/2015 moderate (2+)   Final   Sent for culture   . Urine Culture, Routine 01/07/2015 Final report*  Final  . Result 1 01/07/2015 Escherichia coli*  Final   Greater than 100,000 colony forming units per mL  . ANTIMICROBIAL SUSCEPTIBILITY 01/07/2015 Comment   Final   Comment:       ** S = Susceptible; I = Intermediate; R = Resistant **                    P = Positive; N = Negative             MICS are expressed in micrograms per mL    Antibiotic  RSLT#1    RSLT#2    RSLT#3    RSLT#4 Amoxicillin/Clavulanic Acid    S Ampicillin                      S Cefepime                       S Ceftriaxone                    S Cefuroxime                     S Cephalothin                    I Ciprofloxacin                  S Ertapenem                      S Gentamicin                     S Imipenem                       S Levofloxacin                   S Nitrofurantoin                 S Piperacillin                   S Tetracycline                   S Tobramycin                     S Trimethoprim/Sulfa             R   Appointment on 12/18/2014  Component Date Value Ref Range Status  . Hgb A1c MFr Bld 12/18/2014 7.0* 4.8 - 5.6 % Final   Comment:          Pre-diabetes: 5.7 - 6.4          Diabetes: >6.4          Glycemic control for adults with diabetes: <7.0   . Est. average glucose Bld gHb Est-m* 12/18/2014 154   Final  . Glucose 12/18/2014 199* 65 - 99 mg/dL Final  . BUN 12/18/2014 21  8 - 27 mg/dL Final  . Creatinine, Ser 12/18/2014 1.40* 0.57 - 1.00 mg/dL Final  . GFR calc non Af Amer 12/18/2014 34* >59 mL/min/1.73 Final  . GFR calc Af Amer 12/18/2014 39* >59 mL/min/1.73 Final  . BUN/Creatinine Ratio 12/18/2014 15  11 - 26 Final  . Sodium 12/18/2014 137  134 - 144 mmol/L Final  . Potassium 12/18/2014 5.3* 3.5 - 5.2 mmol/L Final  . Chloride 12/18/2014 94* 97 - 108 mmol/L Final  . CO2 12/18/2014 26  18 - 29 mmol/L Final  . Calcium 12/18/2014 8.9  8.7 - 10.3 mg/dL Final  . Total Protein 12/18/2014 6.9  6.0 - 8.5 g/dL Final  . Albumin 12/18/2014 4.4  3.5 - 4.7 g/dL Final  . Globulin, Total 12/18/2014 2.5  1.5 - 4.5 g/dL Final  . Albumin/Globulin Ratio 12/18/2014 1.8  1.1 - 2.5 Final  . Bilirubin Total 12/18/2014 0.4  0.0 - 1.2 mg/dL Final  . Alkaline Phosphatase 12/18/2014 96  39 -  117 IU/L Final  . AST 12/18/2014 24  0 - 40 IU/L Final  . ALT 12/18/2014 22  0 - 32 IU/L Final  . Microalbum.,U,Random 12/18/2014 21.8  Not Estab. ug/mL Final                 **Please note reference interval change**      Assessment/Plan  1. Pain of right heel - urea (CARMOL) 20 % cream; Apply daily to the callus on the heels  Dispense: 85 g; Refill: 5  2. Essential hypertension - Comprehensive metabolic panel; Future  3. Type 2 diabetes mellitus, uncontrolled, with renal complications - Hemoglobin A1c; Future - Comprehensive metabolic panel; Future  4. Chronic kidney disease, stage III (moderate) - Comprehensive metabolic panel; Future  5. Hypothyroidism, unspecified hypothyroidism type - TSH; Future  6. Dysphagia I will discuss this issue with Dr. Henrene Pastor to see what her next step should be.  7. Hyperlipidemia - Lipid panel; Future  8. Dyspnea Chronic problem. She is comfortable at rest. Mild exertion does cause some dyspnea.  9. Anxiety - ALPRAZolam (XANAX) 0.5 MG tablet; Take one tablet by mouth up to 3 times daily as needed for anxiety or sleep  Dispense: 90 tablet; Refill: 5

## 2015-02-24 NOTE — Progress Notes (Signed)
Art,  She has had chronic dysphagia. Esophagram in 2006 revealed spasm without impedance of barium tablet. Upper endoscopy in 2014 with unimpressive large caliber distal ring which was dilated with large caliber bougie. This did not result in any improvement. Thus, her dysphagia has been felt secondary to dysmotility or spasm. There are no adequate therapies currently present for this issue. You could consider repeating a barium esophagogram with tablet to assure no interval significant obstructing lesion has developed. Thanks [name]

## 2015-02-24 NOTE — Progress Notes (Signed)
Thank you for the explanation. I felt certain there was a missing piece in Karen [name]'s story.

## 2015-03-09 ENCOUNTER — Ambulatory Visit (INDEPENDENT_AMBULATORY_CARE_PROVIDER_SITE_OTHER): Payer: Medicare Other | Admitting: Internal Medicine

## 2015-03-09 ENCOUNTER — Encounter: Payer: Self-pay | Admitting: Internal Medicine

## 2015-03-09 VITALS — BP 130/80 | HR 64 | Temp 97.6°F | Resp 20 | Ht 65.0 in | Wt 130.6 lb

## 2015-03-09 DIAGNOSIS — M1712 Unilateral primary osteoarthritis, left knee: Secondary | ICD-10-CM | POA: Diagnosis not present

## 2015-03-09 MED ORDER — METHYLPREDNISOLONE ACETATE 40 MG/ML IJ SUSP
40.0000 mg | Freq: Once | INTRAMUSCULAR | Status: AC
  Administered 2015-03-09: 40 mg via INTRA_ARTICULAR

## 2015-03-09 MED ORDER — LIDOCAINE HCL (PF) 1 % IJ SOLN
2.0000 mL | Freq: Once | INTRAMUSCULAR | Status: AC
  Administered 2015-03-09: 2 mL

## 2015-03-09 NOTE — Progress Notes (Signed)
Patient ID: Karen [name], female   DOB: Karen [name], 1925, 81 y.o.   MRN: [id number]   Location:  [facility]   Chief Complaint  Patient presents with  . Acute Visit    pain in left leg and knee    HPI: Patient is a 81 y.o. white female seen in the office today for severe left knee pain.  Her knee is so painful that she cannot flex it and is limping terribly to walk.  She also feels very cold in here today and required two blankets to warm up.  Also says her pain is so bad, she's trembling.  Continues with hydrocodone for pain without relief.  Review of Systems:  Review of Systems  Constitutional: Positive for chills and malaise/fatigue. Negative for fever.  Respiratory: Negative for shortness of breath.   Cardiovascular: Positive for leg swelling.  Musculoskeletal: Positive for myalgias and joint pain. Negative for falls.    Past Medical History  Diagnosis Date  . Anemia   . Stroke   . Irregular heartbeat   . Sleep apnea   . Arthritis     body, neck  . TIA (transient ischemic attack) 2012  . Bladder disorder     over active  . Cancer     skin, removed  . Hiatal hernia   . Benign hypertensive heart disease   . CHF (congestive heart failure)   . Vitamin B12 deficiency   . Gastroparesis   . Hyperlipidemia   . Anxiety   . A-fib   . Spinal stenosis   . GERD (gastroesophageal reflux disease)   . Esophageal stricture   . Peripheral vascular disease, unspecified   . Nausea alone   . Diarrhea   . Abnormal involuntary movements(781.0)   . Inflammatory disease of breast   . Transient ischemic attack (TIA), and cerebral infarction without residual deficits(V12.54)   . Urinary tract infection, site not specified   . Benign hypertensive heart disease with congestive heart failure   . Congestive heart failure, unspecified   . Nonspecific (abnormal) findings on radiological and other examination of skull and head   . Unspecified  pruritic disorder   . Inflamed seborrheic keratosis   . Seborrheic keratosis   . Other B-complex deficiencies   . Chronic kidney disease, stage III (moderate)   . Type II or unspecified type diabetes mellitus with renal manifestations, not stated as uncontrolled   . Postmenopausal atrophic vaginitis   . Palpitations   . Mixed incontinence urge and stress (female)(female)   . Contact dermatitis and other eczema, due to unspecified cause   . Unspecified vitamin D deficiency   . Corns and callosities   . Gastroparesis   . Other specified visual disturbances   . Spasm of muscle   . Edema   . Pain in joint, lower leg   . Other and unspecified hyperlipidemia   . Anxiety state, unspecified   . Unspecified hereditary and idiopathic peripheral neuropathy   . Unspecified constipation   . Unspecified essential hypertension   . Unspecified hypothyroidism   . Reflux esophagitis   . Other functional disorder of bladder   . Osteoarthrosis, unspecified whether generalized or localized, unspecified site   . Cervicalgia   . Spinal stenosis, unspecified region other than cervical   . Lumbago   . Unspecified sleep apnea   . Other malaise and fatigue   . Stricture and stenosis of esophagus   . Dyspnea 02/18/2015    Past  Surgical History  Procedure Laterality Date  . Back surgery      x 2  . Abdominal hysterectomy    . Thyroidectomy, partial  1965  . Cholecystectomy  1991  . Hammer toes    . Eye surgery  1994, 1997    bilateral cataract  . Hernia repair  1984aaaaaaaa  . Skin cancer destruction    . Esophageal  2004,2006    dilation, Dr Lyla Son    Allergies  Allergen Reactions  . Celebrex [Celecoxib] Rash  . Doxycycline Rash  . Lyrica [Pregabalin] Rash  . Avandia [Rosiglitazone]   . Macrodantin [Nitrofurantoin Macrocrystal]   . Sulfa Antibiotics   . Vioxx [Rofecoxib]   . Silicone Rash    Gets rash from the touch it.   Medications: Patient's Medications  New Prescriptions    No medications on file  Previous Medications   ALPRAZOLAM (XANAX) 0.5 MG TABLET    Take one tablet by mouth up to 3 times daily as needed for anxiety or sleep   ASPIRIN EC 81 MG TABLET    One daily to help prevent stroke and heart attack   BLOOD GLUCOSE MONITORING SUPPL (BAYER BREEZE 2 SYSTEM) W/DEVICE KIT       CHLORPHENIRAMINE-HYDROCODONE (TUSSIONEX PENNKINETIC ER) 10-8 MG/5ML LQCR    5 cc every 12 hours to control cough   CRANBERRY PO    Take 300 mg by mouth daily.   DESOXIMETASONE (TOPICORT) 0.25 % CREAM    Apply to lesion daily   DIGOXIN (LANOXIN) 0.125 MG TABLET    Take one tablet by mouth on Mondays,Wednedsdays,Fridays and Saturdays   DIPHENHYDRAMINE-ZINC ACETATE (BENADRYL ITCH STOPPING) CREAM    Apply 1 application topically 3 (three) times daily as needed.   ERGOCALCIFEROL (VITAMIN D2) 50000 UNITS CAPSULE    Take 1 capsule (50,000 Units total) by mouth once a week.   FLUOCINONIDE CREAM (LIDEX) 0.05 %    Apply 1 application topically 2 (two) times daily.   FUROSEMIDE (LASIX) 40 MG TABLET    One daily to control edema   GLUCOSE BLOOD (BAYER BREEZE 2 TEST VI)    Check blood sugar 3 x daily as directed. DX:250.02   HYDROCODONE-ACETAMINOPHEN (NORCO/VICODIN) 5-325 MG PER TABLET    Take one tablet by mouth four times daily as needed for pain   INSULIN ASPART (NOVOLOG FLEXPEN) 100 UNIT/ML FLEXPEN    Inject into the skin 3 (three) times daily with meals. 10 units in am, 12 units @ lunch, 14 units @ dinner   INSULIN GLARGINE (LANTUS SOLOSTAR) 100 UNIT/ML SOLOSTAR PEN    Inject 40 units at bed to control diabetes   INSULIN PEN NEEDLE 29G X 12.7MM MISC    Use as directed to administer insulin. DX: 250.40   INSULIN SYRINGE-NEEDLE U-100 (EASY COMFORT INSULIN SYRINGE) 30G X 1/2" 0.5 ML MISC    Use as Directed with insulin injections. Dx: 250.00   LANCET DEVICES (LANCING DEVICE) MISC       LEVOTHYROXINE (SYNTHROID, LEVOTHROID) 100 MCG TABLET    Take one tablet by mouth 30 minutes before breakfast for  thyroid supplement   METOPROLOL (LOPRESSOR) 50 MG TABLET    One twice daily to control heart rate   MULTIPLE VITAMINS-MINERALS (PRESERVISION AREDS 2 PO)    Take by mouth. 1 by mouth two times daily   OMEPRAZOLE (PRILOSEC) 40 MG CAPSULE    Take one capsule by mouth twice daily for stomach   ONDANSETRON (ZOFRAN) 4 MG TABLET    Take  1 tablet (4 mg total) by mouth every 4 (four) hours as needed for nausea or vomiting.   PROAIR HFA 108 (90 BASE) MCG/ACT INHALER       TIZANIDINE (ZANAFLEX) 4 MG CAPSULE    Take one tablet by mouth three times daily as needed to relax muscles.   TRIMETHOPRIM (TRIMPEX) 100 MG TABLET    Take one tablet by mouth every night at bedtime to prevent bladder infection   UREA (CARMOL) 20 % CREAM    Apply daily to the callus on the heels   VITAMIN E PO    Take 400 Units by mouth daily.  Modified Medications   No medications on file  Discontinued Medications   No medications on file    Physical Exam: Filed Vitals:   03/09/15 1032  BP: 130/80  Pulse: 64  Temp: 97.6 F (36.4 C)  TempSrc: Oral  Resp: 20  Height: _0  (1.651 m)  Weight: 130 lb 9.6 oz (59.24 kg)  SpO2: 93%   Physical Exam  Constitutional: She is oriented to person, place, and time. She appears well-developed and well-nourished. No distress.  tremulous  Cardiovascular: Normal rate, regular rhythm, normal heart sounds and intact distal pulses.   Pulmonary/Chest: Effort normal and breath sounds normal.  Musculoskeletal: She exhibits tenderness.  Left knee tenderness just distal to joint and medial aspect of joint; unable to flex  Neurological: She is alert and oriented to person, place, and time.  Psychiatric: She has a normal mood and affect.    Labs reviewed: Basic Metabolic Panel:  Recent Labs  05/14/14 0842  09/17/14 0921 12/18/14 0858 01/14/15 1030 02/17/15 1026  NA 137  < > 141 137 140 143  K 4.5  < > 4.4 5.3* 4.8 4.3  CL 96*  < > 102 94* 95* 99  CO2 27  < > _1 GLUCOSE 98   < > 116* 199* 84 75  BUN 19  < > _2 CREATININE 1.34*  < > 1.07* 1.40* 1.67* 1.44*  CALCIUM 9.3  < > 8.9 8.9 9.0 9.0  TSH 4.640*  --  3.110  --   --  4.150  < > = values in this interval not displayed. Liver Function Tests:  Recent Labs  09/17/14 0921 12/18/14 0858  AST 19 24  ALT 17 22  ALKPHOS 85 96  BILITOT 0.3 0.4  PROT 6.1 6.9   No results for input(s): LIPASE, AMYLASE in the last 8760 hours. No results for input(s): AMMONIA in the last 8760 hours. CBC:  Recent Labs  08/08/14 1250 08/15/14 1036  WBC 13.5* 10.6  NEUTROABS 9.9* 6.9  HGB 11.6 11.7  HCT 34.8 36.5  MCV 92 95   Lipid Panel:  Recent Labs  09/17/14 0921 02/17/15 1026  CHOL 174 153  HDL 44 30*  LDLCALC 105* 91  TRIG 127 160*  CHOLHDL 4.0 5.1*   Lab Results  Component Value Date   HGBA1C 7.8* 02/17/2015    Assessment/Plan 1. Primary osteoarthritis of left knee -continue hydrocodone for pain -improved immediately after depomedrol and lidocaine injection -area was cleansed on medial left knee with betadine x 2, was sprayed with bupivicaine spray and then 1cc lidocaine mixed with 1cc 102m depomedrol was injected intraarticularly via medial injection  Labs/tests ordered: none Next appt:  Keep f/u with Dr. GNyoka Cowdenin Nov, also return prn  Ugonna Keirsey L. Austyn Seier, D.O. [employer] [address]  [place], [place] [address] Cell Phone (Mon-Fri 8am-5pm):  [phone] On Call:  [phone] & follow prompts after 5pm & weekends Office Phone:  [phone] Office Fax:  [phone]

## 2015-03-11 ENCOUNTER — Telehealth: Payer: Self-pay

## 2015-03-11 NOTE — Telephone Encounter (Signed)
I called patient because we received a form for Orthotic braces. I called the patient and she states this company keeps harassing her about getting their equipment and she does not feel the need for it. Patient aware I will further follow-up with the company.  I called [phone] and left message requesting that they no longer contact our patient in reference to medical equipment, the patient has refused. Form was shredded

## 2015-03-12 ENCOUNTER — Other Ambulatory Visit: Payer: Self-pay | Admitting: *Deleted

## 2015-03-12 MED ORDER — HYDROCODONE-ACETAMINOPHEN 5-325 MG PO TABS
ORAL_TABLET | ORAL | Status: DC
Start: 2015-03-12 — End: 2015-04-10

## 2015-03-12 NOTE — Telephone Encounter (Signed)
Patient requested and will pick up 

## 2015-03-16 ENCOUNTER — Other Ambulatory Visit: Payer: Self-pay | Admitting: *Deleted

## 2015-03-16 MED ORDER — TIZANIDINE HCL 4 MG PO CAPS: ORAL_CAPSULE | ORAL | Status: DC

## 2015-03-16 MED ORDER — TRIMETHOPRIM 100 MG PO TABS: ORAL_TABLET | ORAL | Status: DC

## 2015-03-16 NOTE — Telephone Encounter (Signed)
[place]

## 2015-04-10 ENCOUNTER — Other Ambulatory Visit: Payer: Self-pay | Admitting: *Deleted

## 2015-04-10 MED ORDER — HYDROCODONE-ACETAMINOPHEN 5-325 MG PO TABS: ORAL_TABLET | ORAL | Status: DC

## 2015-04-10 NOTE — Telephone Encounter (Signed)
Received fax from [facility] reminding of Rx refill due. Patient called and will pick up

## 2015-04-13 ENCOUNTER — Other Ambulatory Visit: Payer: Self-pay | Admitting: *Deleted

## 2015-04-13 ENCOUNTER — Telehealth: Payer: Self-pay | Admitting: *Deleted

## 2015-04-13 MED ORDER — INSULIN ASPART 100 UNIT/ML FLEXPEN: PEN_INJECTOR | SUBCUTANEOUS | Status: DC

## 2015-04-13 MED ORDER — INSULIN LISPRO 100 UNIT/ML (KWIKPEN): PEN_INJECTOR | SUBCUTANEOUS | Status: DC

## 2015-04-13 NOTE — Telephone Encounter (Signed)
It is OK to make this switch.

## 2015-04-13 NOTE — Telephone Encounter (Signed)
[place]

## 2015-04-13 NOTE — Telephone Encounter (Signed)
[place] with [employer] called and stated that [employer] will not cover the Novolog Flex Pen and wants to know if it is ok to change to Humalog Flex Pen with same directions. Please Advise.

## 2015-04-13 NOTE — Telephone Encounter (Signed)
Pharmacy Notified and will switch. Medication updated in patient's medication list.

## 2015-04-27 ENCOUNTER — Other Ambulatory Visit: Payer: Self-pay | Admitting: *Deleted

## 2015-04-27 MED ORDER — OMEPRAZOLE 40 MG PO CPDR: DELAYED_RELEASE_CAPSULE | ORAL | Status: DC

## 2015-04-27 NOTE — Telephone Encounter (Signed)
[place]

## 2015-05-11 ENCOUNTER — Other Ambulatory Visit: Payer: Self-pay | Admitting: *Deleted

## 2015-05-11 MED ORDER — HYDROCODONE-ACETAMINOPHEN 5-325 MG PO TABS: ORAL_TABLET | ORAL | Status: DC

## 2015-05-11 NOTE — Telephone Encounter (Signed)
Patient requested and will pick up 

## 2015-05-22 ENCOUNTER — Telehealth: Payer: Self-pay | Admitting: *Deleted

## 2015-05-22 NOTE — Telephone Encounter (Signed)
Patient called and wanted to know if we have received her Lantus insulin samples from patient assistance. Medication in refrig. Patient notified to pick up

## 2015-05-25 ENCOUNTER — Other Ambulatory Visit: Payer: Self-pay | Admitting: *Deleted

## 2015-05-25 MED ORDER — DIGOXIN 125 MCG PO TABS: ORAL_TABLET | ORAL | Status: DC

## 2015-05-25 NOTE — Telephone Encounter (Signed)
[place]

## 2015-06-11 ENCOUNTER — Other Ambulatory Visit: Payer: Self-pay | Admitting: *Deleted

## 2015-06-11 MED ORDER — HYDROCODONE-ACETAMINOPHEN 5-325 MG PO TABS: ORAL_TABLET | ORAL | Status: DC

## 2015-06-11 NOTE — Telephone Encounter (Signed)
Patient requested and will pick up. Narcotic contract printed.

## 2015-06-19 ENCOUNTER — Other Ambulatory Visit: Payer: Self-pay

## 2015-06-19 DIAGNOSIS — E1129 Type 2 diabetes mellitus with other diabetic kidney complication: Secondary | ICD-10-CM

## 2015-06-19 MED ORDER — INSULIN GLARGINE 100 UNIT/ML SOLOSTAR PEN: PEN_INJECTOR | SUBCUTANEOUS | Status: DC

## 2015-06-22 ENCOUNTER — Other Ambulatory Visit: Payer: Medicare Other

## 2015-06-22 DIAGNOSIS — E1165 Type 2 diabetes mellitus with hyperglycemia: Secondary | ICD-10-CM | POA: Diagnosis not present

## 2015-06-22 DIAGNOSIS — E785 Hyperlipidemia, unspecified: Secondary | ICD-10-CM | POA: Diagnosis not present

## 2015-06-22 DIAGNOSIS — I10 Essential (primary) hypertension: Secondary | ICD-10-CM

## 2015-06-22 DIAGNOSIS — N183 Chronic kidney disease, stage 3 unspecified: Secondary | ICD-10-CM

## 2015-06-22 DIAGNOSIS — IMO0002 Reserved for concepts with insufficient information to code with codable children: Secondary | ICD-10-CM

## 2015-06-22 DIAGNOSIS — E039 Hypothyroidism, unspecified: Secondary | ICD-10-CM

## 2015-06-22 DIAGNOSIS — E1129 Type 2 diabetes mellitus with other diabetic kidney complication: Secondary | ICD-10-CM

## 2015-06-23 LAB — COMPREHENSIVE METABOLIC PANEL
ALBUMIN: 4 g/dL (ref 3.5–4.7)
ALT: 23 IU/L (ref 0–32)
AST: 26 IU/L (ref 0–40)
Albumin/Globulin Ratio: 1.7 (ref 1.1–2.5)
Alkaline Phosphatase: 95 IU/L (ref 39–117)
BILIRUBIN TOTAL: 0.4 mg/dL (ref 0.0–1.2)
BUN / CREAT RATIO: 14 (ref 11–26)
BUN: 20 mg/dL (ref 8–27)
CO2: 28 mmol/L (ref 18–29)
CREATININE: 1.4 mg/dL — AB (ref 0.57–1.00)
Calcium: 8.9 mg/dL (ref 8.7–10.3)
Chloride: 99 mmol/L (ref 97–106)
GFR calc non Af Amer: 33 mL/min/{1.73_m2} — ABNORMAL LOW (ref >59)
GFR, EST AFRICAN AMERICAN: 38 mL/min/{1.73_m2} — AB (ref >59)
GLOBULIN, TOTAL: 2.4 g/dL (ref 1.5–4.5)
GLUCOSE: 110 mg/dL — AB (ref 65–99)
Potassium: 4.8 mmol/L (ref 3.5–5.2)
SODIUM: 143 mmol/L (ref 136–144)
TOTAL PROTEIN: 6.4 g/dL (ref 6.0–8.5)

## 2015-06-23 LAB — HEMOGLOBIN A1C
Est. average glucose Bld gHb Est-mCnc: 160 mg/dL
Hgb A1c MFr Bld: 7.2 % — ABNORMAL HIGH (ref 4.8–5.6)

## 2015-06-23 LAB — LIPID PANEL
Chol/HDL Ratio: 5.5 ratio units — ABNORMAL HIGH (ref 0.0–4.4)
Cholesterol, Total: 153 mg/dL (ref 100–199)
HDL: 28 mg/dL — ABNORMAL LOW (ref >39)
LDL CALC: 92 mg/dL (ref 0–99)
Triglycerides: 167 mg/dL — ABNORMAL HIGH (ref 0–149)
VLDL CHOLESTEROL CAL: 33 mg/dL (ref 5–40)

## 2015-06-23 LAB — TSH: TSH: 4.27 u[IU]/mL (ref 0.450–4.500)

## 2015-06-24 ENCOUNTER — Encounter: Payer: Self-pay | Admitting: Internal Medicine

## 2015-06-24 ENCOUNTER — Ambulatory Visit (INDEPENDENT_AMBULATORY_CARE_PROVIDER_SITE_OTHER): Payer: Medicare Other | Admitting: Internal Medicine

## 2015-06-24 VITALS — BP 130/68 | HR 54 | Temp 97.6°F | Ht 65.0 in | Wt 138.6 lb

## 2015-06-24 DIAGNOSIS — M25562 Pain in left knee: Secondary | ICD-10-CM

## 2015-06-24 DIAGNOSIS — E039 Hypothyroidism, unspecified: Secondary | ICD-10-CM | POA: Diagnosis not present

## 2015-06-24 DIAGNOSIS — N183 Chronic kidney disease, stage 3 unspecified: Secondary | ICD-10-CM

## 2015-06-24 DIAGNOSIS — N182 Chronic kidney disease, stage 2 (mild): Secondary | ICD-10-CM | POA: Diagnosis not present

## 2015-06-24 DIAGNOSIS — E1165 Type 2 diabetes mellitus with hyperglycemia: Secondary | ICD-10-CM | POA: Diagnosis not present

## 2015-06-24 DIAGNOSIS — I10 Essential (primary) hypertension: Secondary | ICD-10-CM | POA: Diagnosis not present

## 2015-06-24 DIAGNOSIS — E785 Hyperlipidemia, unspecified: Secondary | ICD-10-CM | POA: Diagnosis not present

## 2015-06-24 DIAGNOSIS — E1122 Type 2 diabetes mellitus with diabetic chronic kidney disease: Secondary | ICD-10-CM

## 2015-06-24 DIAGNOSIS — Z794 Long term (current) use of insulin: Secondary | ICD-10-CM | POA: Diagnosis not present

## 2015-06-24 DIAGNOSIS — IMO0002 Reserved for concepts with insufficient information to code with codable children: Secondary | ICD-10-CM

## 2015-06-24 NOTE — Progress Notes (Signed)
Patient ID: Karen [name], female   DOB: 04/18/1924, 81 y.o.   MRN: [id number]    Facility  [facility]    Place of Service:   OFFICE    Allergies  Allergen Reactions  . Celebrex [Celecoxib] Rash  . Doxycycline Rash  . Lyrica [Pregabalin] Rash  . Avandia [Rosiglitazone]   . Macrodantin [Nitrofurantoin Macrocrystal]   . Sulfa Antibiotics   . Vioxx [Rofecoxib]   . Silicone Rash    Gets rash from the touch it.    Chief Complaint  Patient presents with  . Medical Management of Chronic Issues    4 month follow-up, discuss labs (copy printed)     HPI:   Uncontrolled type 2 diabetes mellitus with stage 2 chronic kidney disease, with long-term current use of insulin (HCC) - adequate control  Chronic kidney disease, stage III (moderate) - stable  Essential hypertension - controlled  Hyperlipidemia - HDL has been falling, but total cholesterol and LDL are under satisfactory control.  Hypothyroidism, unspecified hypothyroidism type compensated -   Pain, joint, knee, left - Left knee pain. Walking is worse. Increased swelling. Has requested a knee brace, which I think she should have for the left knee. She says her last injection was in Sept 2016. She is requesting another injection in both knees.    Medications: Patient's Medications  New Prescriptions   No medications on file  Previous Medications   AFLURIA SUSP       ALPRAZOLAM (XANAX) 0.5 MG TABLET    Take one tablet by mouth up to 3 times daily as needed for anxiety or sleep   ASPIRIN EC 81 MG TABLET    One daily to help prevent stroke and heart attack   BLOOD GLUCOSE MONITORING SUPPL (BAYER BREEZE 2 SYSTEM) W/DEVICE KIT       CHLORPHENIRAMINE-HYDROCODONE (TUSSIONEX PENNKINETIC ER) 10-8 MG/5ML LQCR    5 cc every 12 hours to control cough   CRANBERRY PO    Take 300 mg by mouth daily.   DESOXIMETASONE (TOPICORT) 0.25 % CREAM    Apply to lesion daily   DIGOXIN (LANOXIN) 0.125 MG TABLET    Take one tablet by mouth on  Mondays,Wednedsdays,Fridays and Saturdays   DIPHENHYDRAMINE-ZINC ACETATE (BENADRYL ITCH STOPPING) CREAM    Apply 1 application topically 3 (three) times daily as needed.   ERGOCALCIFEROL (VITAMIN D2) 50000 UNITS CAPSULE    Take 1 capsule (50,000 Units total) by mouth once a week.   FLUOCINONIDE CREAM (LIDEX) 0.05 %    Apply 1 application topically 2 (two) times daily.   FUROSEMIDE (LASIX) 40 MG TABLET    One daily to control edema   GLUCOSE BLOOD (BAYER BREEZE 2 TEST VI)    Check blood sugar 3 x daily as directed. DX:250.02   HYDROCODONE-ACETAMINOPHEN (NORCO/VICODIN) 5-325 MG TABLET    Take one tablet by mouth four times daily as needed for pain   INSULIN GLARGINE (LANTUS SOLOSTAR) 100 UNIT/ML SOLOSTAR PEN    Inject 40 units at bed to control diabetes   INSULIN LISPRO (HUMALOG KWIKPEN) 100 UNIT/ML KIWKPEN    Inject 10units before breakfast, 12 units before lunch, and 14units before supper to control sugar. May use up to 3 more units per meal as needed   INSULIN PEN NEEDLE 29G X 12.7MM MISC    Use as directed to administer insulin. DX: 250.40   INSULIN SYRINGE-NEEDLE U-100 (EASY COMFORT INSULIN SYRINGE) 30G X 1/2" 0.5 ML MISC    Use as Directed with insulin injections.   Dx: 250.00   LANCET DEVICES (LANCING DEVICE) MISC       LEVOTHYROXINE (SYNTHROID, LEVOTHROID) 100 MCG TABLET    Take one tablet by mouth 30 minutes before breakfast for thyroid supplement   METOPROLOL (LOPRESSOR) 50 MG TABLET    One twice daily to control heart rate   MULTIPLE VITAMINS-MINERALS (PRESERVISION AREDS 2 PO)    Take by mouth. 1 by mouth two times daily   OMEPRAZOLE (PRILOSEC) 40 MG CAPSULE    Take one capsule by mouth twice daily for stomach   ONDANSETRON (ZOFRAN) 4 MG TABLET    Take 1 tablet (4 mg total) by mouth every 4 (four) hours as needed for nausea or vomiting.   TIZANIDINE (ZANAFLEX) 4 MG CAPSULE    Take one tablet by mouth three times daily as needed to relax muscles.   TIZANIDINE (ZANAFLEX) 4 MG TABLET        TRIMETHOPRIM (TRIMPEX) 100 MG TABLET    Take one tablet by mouth every night at bedtime to prevent bladder infection   UREA (CARMOL) 20 % CREAM    Apply daily to the callus on the heels   VITAMIN E PO    Take 400 Units by mouth daily.  Modified Medications   No medications on file  Discontinued Medications   PROAIR HFA 108 (90 BASE) MCG/ACT INHALER        Review of Systems  Constitutional: Positive for chills and fatigue. Negative for fever, activity change, appetite change and unexpected weight change.  Eyes: Negative.   Respiratory: Negative for shortness of breath and wheezing.   Cardiovascular: Positive for leg swelling. Negative for chest pain and palpitations.       Bilateral mild peripheral arterial disease. She has been evaluated by Dr. Ruta Hinds, vascular surgery, on 06/21/12. He felt that she had bilateral mild peripheral arterial disease. Leg discomfort seems to be more related to her chronic back problems. She has mild external iliac artery stenosis, but does not have severe occlusive disease. No surgical intervention is warranted.  Gastrointestinal: Positive for nausea.       Spastic colon. Intermittent diarrhea. Has more problems with chronic constipation.  Endocrine: Negative for polydipsia, polyphagia and polyuria.       Diabetic. History of thyroid problems. Thyroid has been removed. She is on supplements.  Genitourinary:       Urinary leakage and incontinence. Nocturia. Denies dysuria.  Musculoskeletal: Positive for myalgias, back pain, joint swelling, arthralgias and gait problem.       Complaints of muscular weakness. Chronic backache. Arthritis in other joints. Unsteady gait. Bilateral lower leg discomfort. Bilateral knee pains Enlarged end of the right clavicle at the manubrium. Pain in the right shoulder . Difficulty raising her arm. History of degenerative changes on Xray Dec. 2015.  Skin:       Seborrheic keratosis of the left breast. Generalized itching  without rash.  Neurological: Positive for tremors and headaches.       Has difficulty with balance. There are episodes of dizziness. She experiences numbness in her feet. She walks wobbly and unsteady. His numbness and weakness in the left leg.  Hematological: Negative.   Psychiatric/Behavioral:       Chronic anxiety. Sleeps okay.    Filed Vitals:   06/24/15 1211  BP: 130/68  Pulse: 54  Temp: 97.6 F (36.4 C)  TempSrc: Oral  Height: 5' 5" (1.651 m)  Weight: 138 lb 9.6 oz (62.869 kg)  SpO2: 95%   Body mass index is  23.06 kg/(m^2).  Physical Exam  Constitutional: She is oriented to person, place, and time.  Frail, thin, elderly female.  HENT:  Head: Normocephalic and atraumatic.  Mouth/Throat: No oropharyngeal exudate.  Eyes: Conjunctivae and EOM are normal. Pupils are equal, round, and reactive to light.  Neck: No JVD present. No tracheal deviation present.  Nuchal rigidity. History thyroidectomy.  Cardiovascular: Normal rate and regular rhythm.  Exam reveals no gallop and no friction rub.   No murmur heard. Rate 60 Diminished pedal pulses  Pulmonary/Chest: No respiratory distress. She has no wheezes. She has no rales.  Abdominal: Soft. Bowel sounds are normal. She exhibits no distension and no mass. There is no tenderness.  Musculoskeletal: She exhibits edema (2+ bipedal).  Stiff neck. Right shoulder discomfort on palpation. Unstable gait. Tender processes of the lumbar area. Bilateral lower paraspinal muscular tenderness.  Bulbous, tender knees with increased stiffness and crepitus. Left knee worse than the right.  Lymphadenopathy:    She has no cervical adenopathy.  Neurological: She is alert and oriented to person, place, and time. No cranial nerve deficit. Coordination abnormal.  Difficulty with balance and with walking.  Skin: No rash noted. No erythema. No pallor.  Seborrheic keratosis left breast.  Psychiatric: Her behavior is normal. Thought content normal.    Moderate anxiety.    Labs reviewed: Lab Summary Latest Ref Rng 06/22/2015 02/17/2015 01/14/2015 12/18/2014  Hemoglobin 13.0-17.0 g/dL (None) (None) (None) (None)  Hematocrit 39.0-52.0 % (None) (None) (None) (None)  White count - (None) (None) (None) (None)  Platelet count - (None) (None) (None) (None)  Sodium 136 - 144 mmol/L 143 143 140 137  Potassium 3.5 - 5.2 mmol/L 4.8 4.3 4.8 5.3(H)  Calcium 8.7 - 10.3 mg/dL 8.9 9.0 9.0 8.9  Phosphorus - (None) (None) (None) (None)  Creatinine 0.57 - 1.00 mg/dL 1.40(H) 1.44(H) 1.67(H) 1.40(H)  AST 0 - 40 IU/L 26 (None) (None) 24  Alk Phos 39 - 117 IU/L 95 (None) (None) 96  Bilirubin 0.0 - 1.2 mg/dL 0.4 (None) (None) 0.4  Glucose 65 - 99 mg/dL 110(H) 75 84 199(H)  Cholesterol - (None) (None) (None) (None)  HDL cholesterol >39 mg/dL 28(L) 30(L) (None) (None)  Triglycerides 0 - 149 mg/dL 167(H) 160(H) (None) (None)  LDL Direct - (None) (None) (None) (None)  LDL Calc 0 - 99 mg/dL 92 91 (None) (None)  Total protein - (None) (None) (None) (None)  Albumin 3.5 - 4.7 g/dL 4.0 (None) (None) 4.4   Lab Results  Component Value Date   TSH 4.270 06/22/2015   Lab Results  Component Value Date   BUN 20 06/22/2015   Lab Results  Component Value Date   HGBA1C 7.2* 06/22/2015    Assessment/Plan  Uncontrolled type 2 diabetes mellitus with stage 2 chronic kidney disease, with long-term current use of insulin (HCC) - continue current medication  Chronic kidney disease, stage III (moderate) - stable  Essential hypertension controlled -   Hyperlipidemia - adequately controlled   Hypothyroidism, unspecified hypothyroidism type  - compensated     Pain, joint, knee, left - return for injection in about 3 weeks. Advised patient I would inject both knees at that time. Patient needs a form filled out to continue financial support from [employer] regarding obtaining her insulin. Approximately 30 minutes of staff and by time consuming than this.    

## 2015-06-24 NOTE — Patient Instructions (Signed)
Bring copy of Advance Directives- Health Care Power of Attorney and/or Living Will to next appointment.   

## 2015-07-13 ENCOUNTER — Telehealth: Payer: Self-pay | Admitting: *Deleted

## 2015-07-13 ENCOUNTER — Other Ambulatory Visit: Payer: Self-pay | Admitting: *Deleted

## 2015-07-13 MED ORDER — HYDROCODONE-ACETAMINOPHEN 5-325 MG PO TABS: ORAL_TABLET | ORAL | Status: DC

## 2015-07-13 NOTE — Telephone Encounter (Signed)
Patient called and stated that she has not received her samples of Lantus from [facility] Patient [facility]. I called them at [phone] and spoke with [facility] and she is sending a reorder form. We are to fill it out and fax back and they will ship a reshipment within 2 days. Awaiting form to be faxed.

## 2015-07-13 NOTE — Telephone Encounter (Signed)
Received form from [employer]. Filled out and faxed back to [facility] Fax:[phone]

## 2015-07-13 NOTE — Telephone Encounter (Signed)
[place]. Patient will pick up Rx.

## 2015-07-14 ENCOUNTER — Telehealth: Payer: Self-pay | Admitting: *Deleted

## 2015-07-14 NOTE — Telephone Encounter (Signed)
Received Form from [facility] [phone] for patient Knee Brace [phone] [employer] Hinged Knee Brace with [phone] Suspension Sleeve for Left Knee due to Osteoarthritis.  Patient confirmed she wanted. Filled out form and given to Dr. Nyoka Cowden to review and sign.  To be faxed back to [phone]

## 2015-07-15 ENCOUNTER — Encounter: Payer: Self-pay | Admitting: Internal Medicine

## 2015-07-15 ENCOUNTER — Ambulatory Visit (INDEPENDENT_AMBULATORY_CARE_PROVIDER_SITE_OTHER): Payer: Medicare Other | Admitting: Internal Medicine

## 2015-07-15 VITALS — BP 102/44 | HR 61 | Temp 97.6°F | Resp 20 | Ht 65.0 in | Wt 137.0 lb

## 2015-07-15 DIAGNOSIS — E1165 Type 2 diabetes mellitus with hyperglycemia: Secondary | ICD-10-CM

## 2015-07-15 DIAGNOSIS — E1122 Type 2 diabetes mellitus with diabetic chronic kidney disease: Secondary | ICD-10-CM | POA: Diagnosis not present

## 2015-07-15 DIAGNOSIS — E039 Hypothyroidism, unspecified: Secondary | ICD-10-CM | POA: Diagnosis not present

## 2015-07-15 DIAGNOSIS — M170 Bilateral primary osteoarthritis of knee: Secondary | ICD-10-CM | POA: Diagnosis not present

## 2015-07-15 DIAGNOSIS — N182 Chronic kidney disease, stage 2 (mild): Secondary | ICD-10-CM | POA: Diagnosis not present

## 2015-07-15 DIAGNOSIS — Z794 Long term (current) use of insulin: Secondary | ICD-10-CM | POA: Diagnosis not present

## 2015-07-15 DIAGNOSIS — IMO0002 Reserved for concepts with insufficient information to code with codable children: Secondary | ICD-10-CM

## 2015-07-15 MED ORDER — TRIAMCINOLONE ACETONIDE 40 MG/ML IJ SUSP
40.0000 mg | Freq: Once | INTRAMUSCULAR | Status: AC
  Administered 2015-07-15: 40 mg via INTRA_ARTICULAR

## 2015-07-15 NOTE — Addendum Note (Signed)
Addended by: Rafael Bihari A on: 07/15/2015 04:32 PM   Modules accepted: Orders

## 2015-07-15 NOTE — Progress Notes (Signed)
Patient ID: Karen [name], female   DOB: 1924/06/13, 81 y.o.   MRN: [id number]    Facility  [facility]    Place of Service:   OFFICE    Allergies  Allergen Reactions  . Celebrex [Celecoxib] Rash  . Doxycycline Rash  . Lyrica [Pregabalin] Rash  . Avandia [Rosiglitazone]   . Macrodantin [Nitrofurantoin Macrocrystal]   . Sulfa Antibiotics   . Vioxx [Rofecoxib]   . Silicone Rash    Gets rash from the touch it.    Chief Complaint  Patient presents with  . Medical Management of Chronic Issues    3 week follow-up and  injection in both knees    HPI:  Primary osteoarthritis of both knees - significant increase in pain. Having more difficuty waking. Prior injections have been helpful.    Medications: Patient's Medications  New Prescriptions   No medications on file  Previous Medications   AFLURIA SUSP       ALPRAZOLAM (XANAX) 0.5 MG TABLET    Take one tablet by mouth up to 3 times daily as needed for anxiety or sleep   ASPIRIN EC 81 MG TABLET    One daily to help prevent stroke and heart attack   BLOOD GLUCOSE MONITORING SUPPL (BAYER BREEZE 2 SYSTEM) W/DEVICE KIT       CHLORPHENIRAMINE-HYDROCODONE (TUSSIONEX PENNKINETIC ER) 10-8 MG/5ML LQCR    5 cc every 12 hours to control cough   CRANBERRY PO    Take 300 mg by mouth daily.   DESOXIMETASONE (TOPICORT) 0.25 % CREAM    Apply to lesion daily   DIGOXIN (LANOXIN) 0.125 MG TABLET    Take one tablet by mouth on Mondays,Wednedsdays,Fridays and Saturdays   DIPHENHYDRAMINE-ZINC ACETATE (BENADRYL ITCH STOPPING) CREAM    Apply 1 application topically 3 (three) times daily as needed.   ERGOCALCIFEROL (VITAMIN D2) 50000 UNITS CAPSULE    Take 1 capsule (50,000 Units total) by mouth once a week.   FLUOCINONIDE CREAM (LIDEX) 0.05 %    Apply 1 application topically 2 (two) times daily.   FUROSEMIDE (LASIX) 40 MG TABLET    One daily to control edema   GLUCOSE BLOOD (BAYER BREEZE 2 TEST VI)    Check blood sugar 3 x daily as directed. DX:250.02     HYDROCODONE-ACETAMINOPHEN (NORCO/VICODIN) 5-325 MG TABLET    Take one tablet by mouth four times daily as needed for pain   INSULIN GLARGINE (LANTUS SOLOSTAR) 100 UNIT/ML SOLOSTAR PEN    Inject 40 units at bed to control diabetes   INSULIN LISPRO (HUMALOG KWIKPEN) 100 UNIT/ML KIWKPEN    Inject 10units before breakfast, 12 units before lunch, and 14units before supper to control sugar. May use up to 3 more units per meal as needed   INSULIN PEN NEEDLE 29G X 12.7MM MISC    Use as directed to administer insulin. DX: 250.40   INSULIN SYRINGE-NEEDLE U-100 (EASY COMFORT INSULIN SYRINGE) 30G X 1/2" 0.5 ML MISC    Use as Directed with insulin injections. Dx: 250.00   LANCET DEVICES (LANCING DEVICE) MISC       LEVOTHYROXINE (SYNTHROID, LEVOTHROID) 100 MCG TABLET    Take one tablet by mouth 30 minutes before breakfast for thyroid supplement   METOPROLOL (LOPRESSOR) 50 MG TABLET    One twice daily to control heart rate   MULTIPLE VITAMINS-MINERALS (PRESERVISION AREDS 2 PO)    Take by mouth. 1 by mouth two times daily   OMEPRAZOLE (PRILOSEC) 40 MG CAPSULE    Take one  capsule by mouth twice daily for stomach   ONDANSETRON (ZOFRAN) 4 MG TABLET    Take 1 tablet (4 mg total) by mouth every 4 (four) hours as needed for nausea or vomiting.   TIZANIDINE (ZANAFLEX) 4 MG CAPSULE    Take one tablet by mouth three times daily as needed to relax muscles.   TRIMETHOPRIM (TRIMPEX) 100 MG TABLET    Take one tablet by mouth every night at bedtime to prevent bladder infection   UREA (CARMOL) 20 % CREAM    Apply daily to the callus on the heels   VITAMIN E PO    Take 400 Units by mouth daily.  Modified Medications   No medications on file  Discontinued Medications   TIZANIDINE (ZANAFLEX) 4 MG TABLET        Review of Systems  Constitutional: Positive for chills and fatigue. Negative for fever, activity change, appetite change and unexpected weight change.  Eyes: Negative.   Respiratory: Negative for shortness of breath  and wheezing.   Cardiovascular: Positive for leg swelling. Negative for chest pain and palpitations.       Bilateral mild peripheral arterial disease. She has been evaluated by Dr. Ruta Hinds, vascular surgery, on 06/21/12. He felt that she had bilateral mild peripheral arterial disease. Leg discomfort seems to be more related to her chronic back problems. She has mild external iliac artery stenosis, but does not have severe occlusive disease. No surgical intervention is warranted.  Gastrointestinal: Positive for nausea.       Spastic colon. Intermittent diarrhea. Has more problems with chronic constipation.  Endocrine: Negative for polydipsia, polyphagia and polyuria.       Diabetic. History of thyroid problems. Thyroid has been removed. She is on supplements.  Genitourinary:       Urinary leakage and incontinence. Nocturia. Denies dysuria.  Musculoskeletal: Positive for myalgias, back pain, joint swelling, arthralgias and gait problem.       Complaints of muscular weakness. Chronic backache. Arthritis in other joints. Unsteady gait. Bilateral lower leg discomfort. Bilateral knee pains Enlarged end of the right clavicle at the manubrium. Pain in the right shoulder . Difficulty raising her arm. History of degenerative changes on Xray Dec. 2015.  Skin:       Seborrheic keratosis of the left breast. Generalized itching without rash.  Neurological: Positive for tremors and headaches.       Has difficulty with balance. There are episodes of dizziness. She experiences numbness in her feet. She walks wobbly and unsteady. His numbness and weakness in the left leg.  Hematological: Negative.   Psychiatric/Behavioral:       Chronic anxiety. Sleeps okay.    Filed Vitals:   07/15/15 1227  BP: 102/44  Pulse: 61  Temp: 97.6 F (36.4 C)  TempSrc: Oral  Resp: 20  Height: '5\' 5"'$  (1.651 m)  Weight: 137 lb (62.143 kg)  SpO2: 93%   Body mass index is 22.8 kg/(m^2).  Physical Exam    Constitutional: She is oriented to person, place, and time.  Frail, thin, elderly female.  HENT:  Head: Normocephalic and atraumatic.  Mouth/Throat: No oropharyngeal exudate.  Eyes: Conjunctivae and EOM are normal. Pupils are equal, round, and reactive to light.  Neck: No JVD present. No tracheal deviation present.  Nuchal rigidity. History thyroidectomy.  Cardiovascular: Normal rate and regular rhythm.  Exam reveals no gallop and no friction rub.   No murmur heard. Rate 60 Diminished pedal pulses  Pulmonary/Chest: No respiratory distress. She has no wheezes.  She has no rales.  Abdominal: Soft. Bowel sounds are normal. She exhibits no distension and no mass. There is no tenderness.  Musculoskeletal: She exhibits edema (2+ bipedal).  Stiff neck. Right shoulder discomfort on palpation. Unstable gait. Tender processes of the lumbar area. Bilateral lower paraspinal muscular tenderness.  Bulbous, tender knees with increased stiffness and crepitus. Left knee worse than the right.  Lymphadenopathy:    She has no cervical adenopathy.  Neurological: She is alert and oriented to person, place, and time. No cranial nerve deficit. Coordination abnormal.  Difficulty with balance and with walking.  Skin: No rash noted. No erythema. No pallor.  Seborrheic keratosis left breast.  Psychiatric: Her behavior is normal. Thought content normal.  Moderate anxiety.    Labs reviewed: Lab Summary Latest Ref Rng 06/22/2015 02/17/2015 01/14/2015 12/18/2014  Hemoglobin 13.0-17.0 g/dL (None) (None) (None) (None)  Hematocrit 39.0-52.0 % (None) (None) (None) (None)  White count - (None) (None) (None) (None)  Platelet count - (None) (None) (None) (None)  Sodium 136 - 144 mmol/L 143 143 140 137  Potassium 3.5 - 5.2 mmol/L 4.8 4.3 4.8 5.3(H)  Calcium 8.7 - 10.3 mg/dL 8.9 9.0 9.0 8.9  Phosphorus - (None) (None) (None) (None)  Creatinine 0.57 - 1.00 mg/dL 1.40(H) 1.44(H) 1.67(H) 1.40(H)  AST 0 - 40 IU/L 26 (None)  (None) 24  Alk Phos 39 - 117 IU/L 95 (None) (None) 96  Bilirubin 0.0 - 1.2 mg/dL 0.4 (None) (None) 0.4  Glucose 65 - 99 mg/dL 110(H) 75 84 199(H)  Cholesterol - (None) (None) (None) (None)  HDL cholesterol >39 mg/dL 28(L) 30(L) (None) (None)  Triglycerides 0 - 149 mg/dL 167(H) 160(H) (None) (None)  LDL Direct - (None) (None) (None) (None)  LDL Calc 0 - 99 mg/dL 92 91 (None) (None)  Total protein - (None) (None) (None) (None)  Albumin 3.5 - 4.7 g/dL 4.0 (None) (None) 4.4   Lab Results  Component Value Date   TSH 4.270 06/22/2015   TSH 4.150 02/17/2015   TSH 3.110 09/17/2014   Lab Results  Component Value Date   BUN 20 06/22/2015   BUN 22 02/17/2015   BUN 25 01/14/2015   Lab Results  Component Value Date   HGBA1C 7.2* 06/22/2015   HGBA1C 7.8* 02/17/2015   HGBA1C 7.0* 12/18/2014    Assessment/Plan  1. Primary osteoarthritis of both knees Injected both knees with 1cc xylocaine and 1cc Kenalog. Patient tolerated procedure and was able to walk much more comfortably.  2. Uncontrolled type 2 diabetes mellitus with stage 2 chronic kidney disease, with long-term current use of insulin (HCC) - Hemoglobin A1c; Future - Comprehensive metabolic panel; Future - Microalbumin, urine; Future  3. Hypothyroidism, unspecified hypothyroidism type - TSH; Future

## 2015-07-22 DIAGNOSIS — M1712 Unilateral primary osteoarthritis, left knee: Secondary | ICD-10-CM | POA: Diagnosis not present

## 2015-08-13 ENCOUNTER — Other Ambulatory Visit: Payer: Self-pay | Admitting: *Deleted

## 2015-08-13 DIAGNOSIS — F419 Anxiety disorder, unspecified: Secondary | ICD-10-CM

## 2015-08-13 MED ORDER — HYDROCODONE-ACETAMINOPHEN 5-325 MG PO TABS: ORAL_TABLET | ORAL | Status: DC

## 2015-08-13 MED ORDER — ALPRAZOLAM 0.5 MG PO TABS: ORAL_TABLET | ORAL | Status: DC

## 2015-08-28 ENCOUNTER — Telehealth: Payer: Self-pay | Admitting: *Deleted

## 2015-08-28 DIAGNOSIS — M12519 Traumatic arthropathy, unspecified shoulder: Secondary | ICD-10-CM | POA: Diagnosis not present

## 2015-08-28 MED ORDER — CIPROFLOXACIN HCL 250 MG PO TABS: ORAL_TABLET | ORAL | Status: DC

## 2015-08-28 NOTE — Telephone Encounter (Signed)
Patient notified and faxed to pharmacy.

## 2015-08-28 NOTE — Telephone Encounter (Signed)
Cipro 250 mg (disp 14 tabs) One twice daily for infection.

## 2015-08-28 NOTE — Telephone Encounter (Signed)
Patient called and stated that she woke up with another UTI this morning. Complains of hurting when urinates and frequency.  No available appointments and patient cannot come in. Would like to know if you would call something in for her. Please Advise.

## 2015-09-04 ENCOUNTER — Other Ambulatory Visit: Payer: Self-pay | Admitting: *Deleted

## 2015-09-04 MED ORDER — TIZANIDINE HCL 4 MG PO CAPS: ORAL_CAPSULE | ORAL | Status: DC

## 2015-09-04 NOTE — Telephone Encounter (Signed)
[place]

## 2015-09-10 ENCOUNTER — Other Ambulatory Visit: Payer: Self-pay

## 2015-09-10 DIAGNOSIS — F419 Anxiety disorder, unspecified: Secondary | ICD-10-CM

## 2015-09-10 MED ORDER — OMEPRAZOLE 40 MG PO CPDR: DELAYED_RELEASE_CAPSULE | ORAL | Status: DC

## 2015-09-10 MED ORDER — TRIMETHOPRIM 100 MG PO TABS: ORAL_TABLET | ORAL | Status: DC

## 2015-09-10 MED ORDER — ALPRAZOLAM 0.5 MG PO TABS: ORAL_TABLET | ORAL | Status: DC

## 2015-09-11 ENCOUNTER — Other Ambulatory Visit: Payer: Self-pay | Admitting: *Deleted

## 2015-09-11 ENCOUNTER — Other Ambulatory Visit: Payer: Self-pay

## 2015-09-11 DIAGNOSIS — R000 Tachycardia, unspecified: Secondary | ICD-10-CM

## 2015-09-11 MED ORDER — TIZANIDINE HCL 4 MG PO CAPS: ORAL_CAPSULE | ORAL | Status: DC

## 2015-09-11 MED ORDER — FUROSEMIDE 40 MG PO TABS: ORAL_TABLET | ORAL | Status: DC

## 2015-09-11 MED ORDER — DIGOXIN 125 MCG PO TABS: ORAL_TABLET | ORAL | Status: DC

## 2015-09-11 MED ORDER — METOPROLOL TARTRATE 50 MG PO TABS: ORAL_TABLET | ORAL | Status: DC

## 2015-09-11 MED ORDER — LEVOTHYROXINE SODIUM 100 MCG PO TABS: ORAL_TABLET | ORAL | Status: DC

## 2015-09-11 NOTE — Telephone Encounter (Signed)
[facility] 

## 2015-09-14 ENCOUNTER — Other Ambulatory Visit: Payer: Self-pay | Admitting: *Deleted

## 2015-09-14 MED ORDER — OMEPRAZOLE 40 MG PO CPDR: DELAYED_RELEASE_CAPSULE | ORAL | Status: DC

## 2015-09-14 MED ORDER — TRIMETHOPRIM 100 MG PO TABS: ORAL_TABLET | ORAL | Status: DC

## 2015-09-14 NOTE — Telephone Encounter (Signed)
Patient daughter, Clarene Critchley called and stated that patient wants all of her medications transferred to a new Mail order pharmacy. Pharmacy has received Rx's but has questions regarding the Rx's. Daughter wants [place] to call [facility] [phone] to take care of. Called and spoke with Joseph Art (pharmacist).

## 2015-09-16 DIAGNOSIS — M12519 Traumatic arthropathy, unspecified shoulder: Secondary | ICD-10-CM | POA: Diagnosis not present

## 2015-09-16 DIAGNOSIS — M19049 Primary osteoarthritis, unspecified hand: Secondary | ICD-10-CM | POA: Diagnosis not present

## 2015-09-16 DIAGNOSIS — M546 Pain in thoracic spine: Secondary | ICD-10-CM | POA: Diagnosis not present

## 2015-09-17 ENCOUNTER — Other Ambulatory Visit: Payer: Self-pay | Admitting: *Deleted

## 2015-09-17 DIAGNOSIS — E559 Vitamin D deficiency, unspecified: Secondary | ICD-10-CM

## 2015-09-17 MED ORDER — ERGOCALCIFEROL 1.25 MG (50000 UT) PO CAPS: 50000.0000 [IU] | ORAL_CAPSULE | ORAL | Status: DC

## 2015-09-17 MED ORDER — HYDROCODONE-ACETAMINOPHEN 5-325 MG PO TABS: ORAL_TABLET | ORAL | Status: DC

## 2015-09-17 MED ORDER — "INSULIN SYRINGE-NEEDLE U-100 30G X 1/2"" 0.5 ML MISC": Status: DC

## 2015-09-17 NOTE — Telephone Encounter (Signed)
Karen [name]

## 2015-09-17 NOTE — Telephone Encounter (Signed)
[place] refill request. Patient notified to pick up. Patient also requested insulin pen needles to be faxed to [facility].

## 2015-09-24 ENCOUNTER — Telehealth: Payer: Self-pay

## 2015-09-24 NOTE — Telephone Encounter (Signed)
Message left on triage voicemail: Please return call (no additional information was given).  Left message on voicemail for patient to return call when available, I also instructed Helene Kelp to leave a detailed message if I am unavailable

## 2015-09-25 MED ORDER — TIZANIDINE HCL 4 MG PO TABS: ORAL_TABLET | ORAL | Status: DC

## 2015-09-25 NOTE — Telephone Encounter (Signed)
Karen [name], Caregiver was calling because patient's insurance will not pay for Tizanidine Capsules but will pay for the tablets and wants it switched to tablets #90. Rx corrected and faxed to [facility].

## 2015-10-01 ENCOUNTER — Telehealth: Payer: Self-pay | Admitting: *Deleted

## 2015-10-01 NOTE — Telephone Encounter (Signed)
Karen [name] notified and will call for appointment for mid March

## 2015-10-01 NOTE — Telephone Encounter (Signed)
Any time in [facility] March 2017.

## 2015-10-01 NOTE — Telephone Encounter (Signed)
Patient daughter, Clarene Critchley called and stated that patient would like to know when she can get the knee injections again from Dr. Nyoka Cowden. The last ones were 07/15/15. Please Advise.

## 2015-10-12 ENCOUNTER — Other Ambulatory Visit: Payer: Medicare Other

## 2015-10-12 DIAGNOSIS — IMO0002 Reserved for concepts with insufficient information to code with codable children: Secondary | ICD-10-CM

## 2015-10-12 DIAGNOSIS — Z794 Long term (current) use of insulin: Secondary | ICD-10-CM | POA: Diagnosis not present

## 2015-10-12 DIAGNOSIS — N182 Chronic kidney disease, stage 2 (mild): Principal | ICD-10-CM

## 2015-10-12 DIAGNOSIS — E039 Hypothyroidism, unspecified: Secondary | ICD-10-CM | POA: Diagnosis not present

## 2015-10-12 DIAGNOSIS — E1165 Type 2 diabetes mellitus with hyperglycemia: Principal | ICD-10-CM

## 2015-10-12 DIAGNOSIS — E1122 Type 2 diabetes mellitus with diabetic chronic kidney disease: Secondary | ICD-10-CM

## 2015-10-13 LAB — COMPREHENSIVE METABOLIC PANEL
A/G RATIO: 1.8 (ref 1.2–2.2)
ALBUMIN: 4.4 g/dL (ref 3.5–4.7)
ALT: 16 IU/L (ref 0–32)
AST: 21 IU/L (ref 0–40)
Alkaline Phosphatase: 97 IU/L (ref 39–117)
BILIRUBIN TOTAL: 0.3 mg/dL (ref 0.0–1.2)
BUN / CREAT RATIO: 19 (ref 11–26)
BUN: 32 mg/dL — ABNORMAL HIGH (ref 8–27)
CALCIUM: 9 mg/dL (ref 8.7–10.3)
CHLORIDE: 99 mmol/L (ref 96–106)
CO2: 28 mmol/L (ref 18–29)
Creatinine, Ser: 1.69 mg/dL — ABNORMAL HIGH (ref 0.57–1.00)
GFR, EST AFRICAN AMERICAN: 31 mL/min/{1.73_m2} — AB (ref >59)
GFR, EST NON AFRICAN AMERICAN: 27 mL/min/{1.73_m2} — AB (ref >59)
GLOBULIN, TOTAL: 2.5 g/dL (ref 1.5–4.5)
Glucose: 75 mg/dL (ref 65–99)
POTASSIUM: 4.7 mmol/L (ref 3.5–5.2)
SODIUM: 141 mmol/L (ref 134–144)
TOTAL PROTEIN: 6.9 g/dL (ref 6.0–8.5)

## 2015-10-13 LAB — TSH: TSH: 5.96 u[IU]/mL — ABNORMAL HIGH (ref 0.450–4.500)

## 2015-10-13 LAB — MICROALBUMIN, URINE: Microalbumin, Urine: 14.9 ug/mL

## 2015-10-13 LAB — HEMOGLOBIN A1C
Est. average glucose Bld gHb Est-mCnc: 157 mg/dL
Hgb A1c MFr Bld: 7.1 % — ABNORMAL HIGH (ref 4.8–5.6)

## 2015-10-14 ENCOUNTER — Ambulatory Visit (INDEPENDENT_AMBULATORY_CARE_PROVIDER_SITE_OTHER): Payer: Medicare Other | Admitting: Internal Medicine

## 2015-10-14 ENCOUNTER — Encounter: Payer: Self-pay | Admitting: Internal Medicine

## 2015-10-14 VITALS — BP 144/60 | HR 54 | Temp 97.9°F | Ht 65.0 in | Wt 135.0 lb

## 2015-10-14 DIAGNOSIS — E785 Hyperlipidemia, unspecified: Secondary | ICD-10-CM

## 2015-10-14 DIAGNOSIS — L299 Pruritus, unspecified: Secondary | ICD-10-CM

## 2015-10-14 DIAGNOSIS — E1165 Type 2 diabetes mellitus with hyperglycemia: Secondary | ICD-10-CM | POA: Diagnosis not present

## 2015-10-14 DIAGNOSIS — E1122 Type 2 diabetes mellitus with diabetic chronic kidney disease: Secondary | ICD-10-CM

## 2015-10-14 DIAGNOSIS — L600 Ingrowing nail: Secondary | ICD-10-CM | POA: Insufficient documentation

## 2015-10-14 DIAGNOSIS — N183 Chronic kidney disease, stage 3 unspecified: Secondary | ICD-10-CM

## 2015-10-14 DIAGNOSIS — I10 Essential (primary) hypertension: Secondary | ICD-10-CM

## 2015-10-14 DIAGNOSIS — E039 Hypothyroidism, unspecified: Secondary | ICD-10-CM

## 2015-10-14 DIAGNOSIS — M25562 Pain in left knee: Secondary | ICD-10-CM | POA: Diagnosis not present

## 2015-10-14 DIAGNOSIS — R0600 Dyspnea, unspecified: Secondary | ICD-10-CM

## 2015-10-14 DIAGNOSIS — IMO0002 Reserved for concepts with insufficient information to code with codable children: Secondary | ICD-10-CM

## 2015-10-14 DIAGNOSIS — N182 Chronic kidney disease, stage 2 (mild): Secondary | ICD-10-CM | POA: Diagnosis not present

## 2015-10-14 DIAGNOSIS — M25561 Pain in right knee: Secondary | ICD-10-CM | POA: Diagnosis not present

## 2015-10-14 DIAGNOSIS — Z794 Long term (current) use of insulin: Secondary | ICD-10-CM

## 2015-10-14 NOTE — Patient Instructions (Signed)
Try Zyrtec 10 mg daily for itching.

## 2015-10-14 NOTE — Progress Notes (Signed)
Patient ID: Karen [name], female   DOB: [birth year], 82 y.o.   MRN: [id number]   Location:  [facility]  Provider: Jeanmarie Hubert, M.D.  Code Status: full Goals of Care:  Advanced Directives 10/14/2015  Does patient have an advance directive? Yes  Type of Paramedic of [place];Living will  Does patient want to make changes to advanced directive? -  Copy of advanced directive(s) in chart? Yes  Would patient like information on creating an advanced directive? -     Chief Complaint  Patient presents with  . Medical Management of Chronic Issues    blood sugar, thyroid, anemia  . Knee Pain    both knees hurting, wants injection in both.  . Toe Pain    big toe on (R) foot gets red and swollen for about an hour, off and on not daily     HPI: Patient is a 82 y.o. female seen today for medical management of chronic diseases.    Uncontrolled type 2 diabetes mellitus with stage 2 chronic kidney disease, with long-term current use of insulin (HCC) - stable  Essential hypertension - controlled  Hyperlipidemia - controlled  Hypothyroidism, unspecified hypothyroidism type - TSH is higher. She says she is regular about taking her medicaitons.  Chronic kidney disease, stage III (moderate) - BUN and creat are higher.   Dyspnea - improved  Pruritic disorder - continue to have terrible days of itching. No creams or lotions have been helpful.  Pain, joint, knee, left - requesting injection  Pain in right knee - requesting injection  Painful right great toenail.     Past Medical History  Diagnosis Date  . Anemia   . Stroke ([place])   . Irregular heartbeat   . Sleep apnea   . Arthritis     body, neck  . TIA (transient ischemic attack) 2012  . Bladder disorder     over active  . Cancer ([place])     skin, removed  . Hiatal hernia   . Benign hypertensive heart disease   . CHF (congestive heart failure) ([place])   . Vitamin B12 deficiency   . Gastroparesis    . Hyperlipidemia   . Anxiety   . A-fib ([place])   . Spinal stenosis   . GERD (gastroesophageal reflux disease)   . Esophageal stricture   . Peripheral vascular disease, unspecified ([place])   . Nausea alone   . Diarrhea   . Abnormal involuntary movements(781.0)   . Inflammatory disease of breast   . Transient ischemic attack (TIA), and cerebral infarction without residual deficits   . Urinary tract infection, site not specified   . Benign hypertensive heart disease with congestive heart failure ([place])   . Congestive heart failure, unspecified   . Nonspecific (abnormal) findings on radiological and other examination of skull and head   . Unspecified pruritic disorder   . Inflamed seborrheic keratosis   . Seborrheic keratosis   . Other B-complex deficiencies   . Chronic kidney disease, stage III (moderate)   . Type II or unspecified type diabetes mellitus with renal manifestations, not stated as uncontrolled   . Postmenopausal atrophic vaginitis   . Palpitations   . Mixed incontinence urge and stress (female)(female)   . Contact dermatitis and other eczema, due to unspecified cause   . Unspecified vitamin D deficiency   . Corns and callosities   . Gastroparesis   . Other specified visual disturbances   . Spasm of muscle   . Edema   .  Pain in joint, lower leg   . Other and unspecified hyperlipidemia   . Anxiety state, unspecified   . Unspecified hereditary and idiopathic peripheral neuropathy   . Unspecified constipation   . Unspecified essential hypertension   . Unspecified hypothyroidism   . Reflux esophagitis   . Other functional disorder of bladder   . Osteoarthrosis, unspecified whether generalized or localized, unspecified site   . Cervicalgia   . Spinal stenosis, unspecified region other than cervical   . Lumbago   . Unspecified sleep apnea   . Other malaise and fatigue   . Stricture and stenosis of esophagus   . Dyspnea 02/18/2015    Past Surgical History  Procedure  Laterality Date  . Back surgery      x 2  . Abdominal hysterectomy    . Thyroidectomy, partial  1965  . Cholecystectomy  1991  . Hammer toes    . Eye surgery  1994, 1997    bilateral cataract  . Hernia repair  1984aaaaaaaa  . Skin cancer destruction    . Esophageal  2004,2006    dilation, Dr Lyla Son    Allergies  Allergen Reactions  . Celebrex [Celecoxib] Rash  . Doxycycline Rash  . Lyrica [Pregabalin] Rash  . Avandia [Rosiglitazone]   . Macrodantin [Nitrofurantoin Macrocrystal]   . Sulfa Antibiotics   . Vioxx [Rofecoxib]   . Silicone Rash    Gets rash from the touch it.      Medication List       This list is accurate as of: 10/14/15  3:24 PM.  Always use your most recent med list.               AFLURIA Susp  Generic drug:  Influenza Virus Vaccine Split     ALPRAZolam 0.5 MG tablet  Commonly known as:  XANAX  Take one tablet by mouth up to 3 times daily as needed for anxiety or sleep     aspirin EC 81 MG tablet  One daily to help prevent stroke and heart attack     BAYER BREEZE 2 SYSTEM w/Device Kit     BAYER BREEZE 2 TEST VI  Check blood sugar 3 x daily as directed. DX:250.02     BENADRYL ITCH STOPPING cream  Generic drug:  diphenhydrAMINE-zinc acetate  Apply 1 application topically 3 (three) times daily as needed.     chlorpheniramine-HYDROcodone 10-8 MG/5ML Lqcr  Commonly known as:  TUSSIONEX PENNKINETIC ER  5 cc every 12 hours to control cough     CRANBERRY PO  Take 300 mg by mouth daily.     desoximetasone 0.25 % cream  Commonly known as:  TOPICORT  Apply to lesion daily     digoxin 0.125 MG tablet  Commonly known as:  LANOXIN  Take one tablet by mouth on Mondays,Wednedsdays,Fridays and Saturdays     ergocalciferol 50000 units capsule  Commonly known as:  VITAMIN D2  Take 1 capsule (50,000 Units total) by mouth once a week.     fluocinonide cream 0.05 %  Commonly known as:  LIDEX  Apply 1 application topically 2 (two) times daily.      furosemide 40 MG tablet  Commonly known as:  LASIX  One daily to control edema     HYDROcodone-acetaminophen 5-325 MG tablet  Commonly known as:  NORCO/VICODIN  Take one tablet by mouth four times daily as needed for pain     Insulin Glargine 100 UNIT/ML Solostar Pen  Commonly known as:  LANTUS SOLOSTAR  Inject 40 units at bed to control diabetes     insulin lispro 100 UNIT/ML KiwkPen  Commonly known as:  HUMALOG KWIKPEN  Inject 10units before breakfast, 12 units before lunch, and 14units before supper to control sugar. May use up to 3 more units per meal as needed     Insulin Pen Needle 29G X 12.7MM Misc  Use as directed to administer insulin. DX: 250.40     Insulin Syringe-Needle U-100 30G X 1/2" 0.5 ML Misc  Commonly known as:  EASY COMFORT INSULIN SYRINGE  Use as Directed with insulin injections. Dx: 250.00     Lancing Device Misc     levothyroxine 100 MCG tablet  Commonly known as:  SYNTHROID, LEVOTHROID  Take one tablet by mouth 30 minutes before breakfast for thyroid supplement     metoprolol 50 MG tablet  Commonly known as:  LOPRESSOR  One twice daily to control heart rate     omeprazole 40 MG capsule  Commonly known as:  PRILOSEC  Take one capsule by mouth twice daily for stomach     ondansetron 4 MG tablet  Commonly known as:  ZOFRAN  Take 1 tablet (4 mg total) by mouth every 4 (four) hours as needed for nausea or vomiting.     PRESERVISION AREDS 2 PO  Take by mouth. 1 by mouth two times daily     tiZANidine 4 MG tablet  Commonly known as:  ZANAFLEX  Take one tablet by mouth three times daily as needed to relax muscles     trimethoprim 100 MG tablet  Commonly known as:  TRIMPEX  Take one tablet by mouth every night at bedtime to prevent bladder infection     urea 20 % cream  Commonly known as:  CARMOL  Apply daily to the callus on the heels     VITAMIN E PO  Take 400 Units by mouth daily.        Review of Systems:  Review of Systems    Constitutional: Positive for chills and fatigue. Negative for fever, activity change, appetite change and unexpected weight change.  Eyes: Negative.   Respiratory: Negative for shortness of breath and wheezing.   Cardiovascular: Positive for leg swelling. Negative for chest pain and palpitations.       Bilateral mild peripheral arterial disease. She has been evaluated by Dr. Fabienne Bruns, vascular surgery, on 06/21/12. He felt that she had bilateral mild peripheral arterial disease. Leg discomfort seems to be more related to her chronic back problems. She has mild external iliac artery stenosis, but does not have severe occlusive disease. No surgical intervention is warranted.  Gastrointestinal: Positive for nausea.       Spastic colon. Intermittent diarrhea. Has more problems with chronic constipation.  Endocrine: Negative for polydipsia, polyphagia and polyuria.       Diabetic. History of thyroid problems. Thyroid has been removed. She is on supplements.  Genitourinary:       Urinary leakage and incontinence. Nocturia. Denies dysuria.  Musculoskeletal: Positive for myalgias, back pain, joint swelling, arthralgias and gait problem.       Complaints of muscular weakness. Chronic backache. Arthritis in other joints. Unsteady gait. Bilateral lower leg discomfort. Bilateral knee pains Enlarged end of the right clavicle at the manubrium. Pain in the right shoulder . Difficulty raising her arm. History of degenerative changes on Xray Dec. 2015.  Skin:       Seborrheic keratosis of the left breast. Generalized itching without rash.  Ingrown right great toenail.  Neurological: Positive for tremors and headaches.       Has difficulty with balance. There are episodes of dizziness. She experiences numbness in her feet. She walks wobbly and unsteady. His numbness and weakness in the left leg.  Hematological: Negative.   Psychiatric/Behavioral:       Chronic anxiety. Sleeps okay.    Health  Maintenance  Topic Date Due  . ZOSTAVAX  04/12/1986  . PNA vac Low Risk Adult (2 of 2 - PPSV23) 09/24/2015  . OPHTHALMOLOGY EXAM  09/26/2015  . FOOT EXAM  11/12/2015  . INFLUENZA VACCINE  03/01/2016  . HEMOGLOBIN A1C  04/13/2016  . URINE MICROALBUMIN  10/11/2016  . TETANUS/TDAP  07/21/2023  . DEXA SCAN  Completed    Physical Exam: Filed Vitals:   10/14/15 1440  BP: 144/60  Pulse: 54  Temp: 97.9 F (36.6 C)  TempSrc: Oral  Height: '5\' 5"'$  (1.651 m)  Weight: 135 lb (61.236 kg)  SpO2: 94%   Body mass index is 22.47 kg/(m^2). Physical Exam  Constitutional: She is oriented to person, place, and time.  Frail, thin, elderly female.  HENT:  Head: Normocephalic and atraumatic.  Mouth/Throat: No oropharyngeal exudate.  Eyes: Conjunctivae and EOM are normal. Pupils are equal, round, and reactive to light.  Neck: No JVD present. No tracheal deviation present.  Nuchal rigidity. History thyroidectomy.  Cardiovascular: Normal rate and regular rhythm.  Exam reveals no gallop and no friction rub.   No murmur heard. Rate 60 Diminished pedal pulses  Pulmonary/Chest: No respiratory distress. She has no wheezes. She has no rales.  Abdominal: Soft. Bowel sounds are normal. She exhibits no distension and no mass. There is no tenderness.  Musculoskeletal: She exhibits edema (2+ bipedal).  Stiff neck. Right shoulder discomfort on palpation. Unstable gait. Tender processes of the lumbar area. Bilateral lower paraspinal muscular tenderness.  Bulbous, tender knees with increased stiffness and crepitus. Left knee worse than the right.  Lymphadenopathy:    She has no cervical adenopathy.  Neurological: She is alert and oriented to person, place, and time. No cranial nerve deficit. Coordination abnormal.  Difficulty with balance and with walking.  Skin: No rash noted. No erythema. No pallor.  Seborrheic keratosis left breast. Mildly ingrown right great toenail.  Psychiatric: Her behavior is normal.  Thought content normal.  Moderate anxiety.    Labs reviewed: Basic Metabolic Panel:  Recent Labs  02/17/15 1026 06/22/15 0900 10/12/15 0903  NA 143 143 141  K 4.3 4.8 4.7  CL 99 99 99  CO2 '26 28 28  '$ GLUCOSE 75 110* 75  BUN 22 20 32*  CREATININE 1.44* 1.40* 1.69*  CALCIUM 9.0 8.9 9.0  TSH 4.150 4.270 5.960*   Liver Function Tests:  Recent Labs  12/18/14 0858 06/22/15 0900 10/12/15 0903  AST '24 26 21  '$ ALT '22 23 16  '$ ALKPHOS 96 95 97  BILITOT 0.4 0.4 0.3  PROT 6.9 6.4 6.9  ALBUMIN 4.4 4.0 4.4   No results for input(s): LIPASE, AMYLASE in the last 8760 hours. No results for input(s): AMMONIA in the last 8760 hours. CBC: No results for input(s): WBC, NEUTROABS, HGB, HCT, MCV, PLT in the last 8760 hours. Lipid Panel:  Recent Labs  02/17/15 1026 06/22/15 0900  CHOL 153 153  HDL 30* 28*  LDLCALC 91 92  TRIG 160* 167*  CHOLHDL 5.1* 5.5*   Lab Results  Component Value Date   HGBA1C 7.1* 10/12/2015    Procedures since last  visit: No results found.  Assessment/Plan  1. Uncontrolled type 2 diabetes mellitus with stage 2 chronic kidney disease, with long-term current use of insulin (HCC) Continue current medications - Hemoglobin A1c; Future - Basic metabolic panel; Future - Microalbumin, urine; Future  2. Essential hypertension controlled - Basic metabolic panel; Future  3. Hyperlipidemia - Lipid panel; Future  4. Hypothyroidism, unspecified hypothyroidism type - TSH; Future  5. Chronic kidney disease, stage III (moderate) - Basic metabolic panel; Future  6. Dyspnea improved  7. Pruritic disorder Try Zyrtec  8. Pain, joint, knee, left Injection with 1 cc kenalog and 2 cc lidocaine  9. Pain in right knee Injection with 1 cc kenalog and 2 cc lidocaine  10. Ingrown nail Debrided with clippers

## 2015-10-19 ENCOUNTER — Other Ambulatory Visit: Payer: Self-pay | Admitting: Nurse Practitioner

## 2015-11-03 ENCOUNTER — Telehealth: Payer: Self-pay

## 2015-11-03 NOTE — Telephone Encounter (Signed)
Manual fax was received from [facility] requesting prescription for Topical Pain Management: Lidocaine and Prilocaine Cream. I tried to call patient x 2 to confirm that she requested this medication and what is the indicated use for the medication. Phone rung continuously with no voicemail pick-up, I will try to reach patient again later.

## 2015-11-04 DIAGNOSIS — E1165 Type 2 diabetes mellitus with hyperglycemia: Secondary | ICD-10-CM | POA: Diagnosis not present

## 2015-11-19 ENCOUNTER — Other Ambulatory Visit: Payer: Self-pay | Admitting: *Deleted

## 2015-11-19 MED ORDER — HYDROCODONE-ACETAMINOPHEN 5-325 MG PO TABS: ORAL_TABLET | ORAL | Status: DC

## 2015-11-19 NOTE — Telephone Encounter (Signed)
Called patient again, phone rung continuously, no answer. Patient has pending appointment in June, request for topical pain management to be discussed at that time, unless patient calls the office to discuss sooner.

## 2015-11-20 ENCOUNTER — Other Ambulatory Visit: Payer: Self-pay

## 2015-11-20 MED ORDER — HYDROCODONE-ACETAMINOPHEN 5-325 MG PO TABS: ORAL_TABLET | ORAL | Status: DC

## 2015-11-20 NOTE — Telephone Encounter (Signed)
Patient's son was calling to confirm rx is ready for pick-up. Dorothy checked the entire file bin and was unable to locate rx, we also checked the sign out sheet from 11/19/15 (day rx was printed). RX was re-printed due to unable to locate rx that was printed yesterday.

## 2015-11-25 DIAGNOSIS — H353132 Nonexudative age-related macular degeneration, bilateral, intermediate dry stage: Secondary | ICD-10-CM | POA: Diagnosis not present

## 2015-11-25 DIAGNOSIS — H04123 Dry eye syndrome of bilateral lacrimal glands: Secondary | ICD-10-CM | POA: Diagnosis not present

## 2015-12-02 ENCOUNTER — Other Ambulatory Visit: Payer: Self-pay | Admitting: *Deleted

## 2015-12-02 MED ORDER — HYDROCODONE-ACETAMINOPHEN 5-325 MG PO TABS: ORAL_TABLET | ORAL | Status: DC

## 2015-12-03 ENCOUNTER — Other Ambulatory Visit: Payer: Self-pay

## 2015-12-03 MED ORDER — INSULIN LISPRO 100 UNIT/ML (KWIKPEN): PEN_INJECTOR | SUBCUTANEOUS | Status: DC

## 2015-12-03 NOTE — Telephone Encounter (Signed)
Rx printed and faxed to [facility] for Humalog 100u/ml Kwikpen. #[id number]   RF: 5   Fax [phone]   I had to reprint Rx due to listing wrong signing provider.

## 2015-12-15 ENCOUNTER — Telehealth: Payer: Self-pay | Admitting: *Deleted

## 2015-12-15 NOTE — Telephone Encounter (Signed)
Patient Connection Assistance Form for Lantus dropped off to be filled out by Dr. Nyoka Cowden. Filled out form and placed in folder for Dr. Nyoka Cowden to review and sign.  [employer] [phone] to be faxed back to [facility] Fax#: [phone]

## 2015-12-15 NOTE — Telephone Encounter (Signed)
Dr. Nyoka Cowden signed form and faxed to [facility] Patient Connection.

## 2015-12-31 ENCOUNTER — Telehealth: Payer: Self-pay

## 2015-12-31 NOTE — Telephone Encounter (Signed)
Patient called to verify that we completed patient assistance forms. Patient informed that forms were signed and submitted to patient assistance on 12-15-15. Patient informed we will call her when we receive medication

## 2016-01-06 ENCOUNTER — Other Ambulatory Visit: Payer: Self-pay | Admitting: *Deleted

## 2016-01-06 DIAGNOSIS — E1129 Type 2 diabetes mellitus with other diabetic kidney complication: Secondary | ICD-10-CM

## 2016-01-06 MED ORDER — INSULIN GLARGINE 100 UNIT/ML SOLOSTAR PEN: PEN_INJECTOR | SUBCUTANEOUS | Status: DC

## 2016-01-06 NOTE — Telephone Encounter (Signed)
[place] [facility]

## 2016-01-07 ENCOUNTER — Telehealth: Payer: Self-pay

## 2016-01-07 NOTE — Telephone Encounter (Signed)
Patient said she received a call from this office and she was not at home and the message told her to call a number but she could not call it at that time.  I checked for a note but there are none called her back told her we did not call her but it could have been a electronic call for her up coming appointment.

## 2016-01-11 ENCOUNTER — Telehealth: Payer: Self-pay | Admitting: *Deleted

## 2016-01-11 NOTE — Telephone Encounter (Signed)
Caregiver, jean Little called and stated that the form needed to be canceled due to company is going to pay for patient's insulin once she meets her deductible. Form from [employer] shredded. Caregiver stated that another form from [employer] will be sent for Dr. Nyoka Cowden to review and sign.

## 2016-01-11 NOTE — Telephone Encounter (Signed)
Received fax from [employer] for [employer]. Filled out and left in folder for Dr. Nyoka Cowden to review and sign. To be faxed back to Fax#: [phone] [facility] [employer] [facility] [phone]

## 2016-01-18 ENCOUNTER — Other Ambulatory Visit: Payer: Medicare Other

## 2016-01-18 ENCOUNTER — Telehealth: Payer: Self-pay | Admitting: *Deleted

## 2016-01-18 DIAGNOSIS — E785 Hyperlipidemia, unspecified: Secondary | ICD-10-CM

## 2016-01-18 DIAGNOSIS — E1165 Type 2 diabetes mellitus with hyperglycemia: Principal | ICD-10-CM

## 2016-01-18 DIAGNOSIS — E1122 Type 2 diabetes mellitus with diabetic chronic kidney disease: Secondary | ICD-10-CM | POA: Diagnosis not present

## 2016-01-18 DIAGNOSIS — E039 Hypothyroidism, unspecified: Secondary | ICD-10-CM

## 2016-01-18 DIAGNOSIS — N183 Chronic kidney disease, stage 3 unspecified: Secondary | ICD-10-CM

## 2016-01-18 DIAGNOSIS — I10 Essential (primary) hypertension: Secondary | ICD-10-CM

## 2016-01-18 DIAGNOSIS — Z794 Long term (current) use of insulin: Secondary | ICD-10-CM | POA: Diagnosis not present

## 2016-01-18 DIAGNOSIS — N182 Chronic kidney disease, stage 2 (mild): Principal | ICD-10-CM

## 2016-01-18 DIAGNOSIS — IMO0002 Reserved for concepts with insufficient information to code with codable children: Secondary | ICD-10-CM

## 2016-01-18 NOTE — Telephone Encounter (Signed)
Patient dropped off Application for [employer] Patient Connection to fill out for Lantus. Patient Assistance Connection [phone] Fax: [phone]. Form Completed and placed for Dr. Nyoka Cowden to review and sign. To be faxed to [employer] once completed.

## 2016-01-19 LAB — BASIC METABOLIC PANEL
BUN/Creatinine Ratio: 20 (ref 12–28)
BUN: 32 mg/dL — ABNORMAL HIGH (ref 8–27)
CO2: 25 mmol/L (ref 18–29)
CREATININE: 1.57 mg/dL — AB (ref 0.57–1.00)
Calcium: 9.3 mg/dL (ref 8.7–10.3)
Chloride: 99 mmol/L (ref 96–106)
GFR, EST AFRICAN AMERICAN: 33 mL/min/{1.73_m2} — AB (ref >59)
GFR, EST NON AFRICAN AMERICAN: 29 mL/min/{1.73_m2} — AB (ref >59)
Glucose: 76 mg/dL (ref 65–99)
POTASSIUM: 4.6 mmol/L (ref 3.5–5.2)
SODIUM: 144 mmol/L (ref 134–144)

## 2016-01-19 LAB — LIPID PANEL
CHOLESTEROL TOTAL: 182 mg/dL (ref 100–199)
Chol/HDL Ratio: 5.9 ratio units — ABNORMAL HIGH (ref 0.0–4.4)
HDL: 31 mg/dL — AB (ref >39)
LDL CALC: 111 mg/dL — AB (ref 0–99)
TRIGLYCERIDES: 202 mg/dL — AB (ref 0–149)
VLDL CHOLESTEROL CAL: 40 mg/dL (ref 5–40)

## 2016-01-19 LAB — HEMOGLOBIN A1C
Est. average glucose Bld gHb Est-mCnc: 183 mg/dL
HEMOGLOBIN A1C: 8 % — AB (ref 4.8–5.6)

## 2016-01-19 LAB — MICROALBUMIN, URINE: Microalbumin, Urine: 29.3 ug/mL

## 2016-01-19 LAB — TSH: TSH: 5.77 u[IU]/mL — AB (ref 0.450–4.500)

## 2016-01-19 MED ORDER — LEVOTHYROXINE SODIUM 125 MCG PO TABS: 125.0000 ug | ORAL_TABLET | Freq: Every day | ORAL | Status: DC

## 2016-01-20 ENCOUNTER — Other Ambulatory Visit: Payer: Self-pay | Admitting: Internal Medicine

## 2016-01-21 NOTE — Telephone Encounter (Signed)
Received fax from [employer] [phone] stating that the application they received requesting patient assistance for Lantus was not their current version and needs the updated application filled out.  I called and spoke with Joaquim Lai at [facility] and she will be faxing an updated application.

## 2016-01-25 ENCOUNTER — Other Ambulatory Visit: Payer: Self-pay

## 2016-01-25 NOTE — Telephone Encounter (Signed)
Received [employer] application and filled out. Awaiting patient's appointment on 01/27/2016 to fax to [facility] after patient signature. Patient aware.

## 2016-01-27 ENCOUNTER — Encounter: Payer: Self-pay | Admitting: Internal Medicine

## 2016-01-27 ENCOUNTER — Ambulatory Visit (INDEPENDENT_AMBULATORY_CARE_PROVIDER_SITE_OTHER): Payer: Medicare Other | Admitting: Internal Medicine

## 2016-01-27 ENCOUNTER — Telehealth: Payer: Self-pay | Admitting: *Deleted

## 2016-01-27 VITALS — BP 142/62 | HR 52 | Temp 97.8°F | Ht 65.0 in | Wt 133.0 lb

## 2016-01-27 DIAGNOSIS — M4806 Spinal stenosis, lumbar region: Secondary | ICD-10-CM

## 2016-01-27 DIAGNOSIS — N183 Chronic kidney disease, stage 3 unspecified: Secondary | ICD-10-CM

## 2016-01-27 DIAGNOSIS — Z23 Encounter for immunization: Secondary | ICD-10-CM

## 2016-01-27 DIAGNOSIS — E785 Hyperlipidemia, unspecified: Secondary | ICD-10-CM

## 2016-01-27 DIAGNOSIS — E1122 Type 2 diabetes mellitus with diabetic chronic kidney disease: Secondary | ICD-10-CM | POA: Diagnosis not present

## 2016-01-27 DIAGNOSIS — IMO0002 Reserved for concepts with insufficient information to code with codable children: Secondary | ICD-10-CM

## 2016-01-27 DIAGNOSIS — M25562 Pain in left knee: Secondary | ICD-10-CM | POA: Diagnosis not present

## 2016-01-27 DIAGNOSIS — R251 Tremor, unspecified: Secondary | ICD-10-CM | POA: Diagnosis not present

## 2016-01-27 DIAGNOSIS — N182 Chronic kidney disease, stage 2 (mild): Secondary | ICD-10-CM | POA: Diagnosis not present

## 2016-01-27 DIAGNOSIS — E1165 Type 2 diabetes mellitus with hyperglycemia: Secondary | ICD-10-CM | POA: Diagnosis not present

## 2016-01-27 DIAGNOSIS — E039 Hypothyroidism, unspecified: Secondary | ICD-10-CM | POA: Diagnosis not present

## 2016-01-27 DIAGNOSIS — I10 Essential (primary) hypertension: Secondary | ICD-10-CM | POA: Diagnosis not present

## 2016-01-27 DIAGNOSIS — M48061 Spinal stenosis, lumbar region without neurogenic claudication: Secondary | ICD-10-CM

## 2016-01-27 DIAGNOSIS — R609 Edema, unspecified: Secondary | ICD-10-CM | POA: Diagnosis not present

## 2016-01-27 DIAGNOSIS — Z794 Long term (current) use of insulin: Secondary | ICD-10-CM

## 2016-01-27 MED ORDER — LIDOCAINE-PRILOCAINE 2.5-2.5 % EX CREA: TOPICAL_CREAM | CUTANEOUS | Status: DC

## 2016-01-27 NOTE — Telephone Encounter (Signed)
Patient brought a form from [employer] Patient Assistance Program [phone] to be filled out and faxed for her Humalog Kwikpen. Patient is almost in the doughnut hole and needs form filled out. Filled out and given to Dr. Nyoka Cowden to review and sign. To be faxed back to Fax#: [phone]

## 2016-01-27 NOTE — Progress Notes (Signed)
Patient ID: Karen [name], female   DOB: 03-04-1924, 82 y.o.   MRN: [id number]    Facility  [facility]    Place of Service:   OFFICE    Allergies  Allergen Reactions  . Celebrex [Celecoxib] Rash  . Doxycycline Rash  . Lyrica [Pregabalin] Rash  . Avandia [Rosiglitazone]   . Macrodantin [Nitrofurantoin Macrocrystal]   . Sulfa Antibiotics   . Vioxx [Rofecoxib]   . Silicone Rash    Gets rash from the touch it.    Chief Complaint  Patient presents with  . Medical Management of Chronic Issues    3 month medication medication blood sugar, blood pressure, cholesterol, thyroid.   Marland Kitchen immunization    Pneumovax23 given    HPI:  Home CBG is running higher. Has stopped eating biscuit with jelly in the morning.  Feel about 10 days ago. Np injury. Feel when she got out of bed to take a muscle relaxer. Had to wait for her son to come from his house to get her off the floor. She could not get up unassisted.  Having frequent muscle spasm. Using a muscle relaxer that may contribute to unsteadiness.  Using Xanax at bed for sleep as well as for anxiety during the day.  Medications: Patient's Medications  New Prescriptions   No medications on file  Previous Medications   AFLURIA SUSP       ALPRAZOLAM (XANAX) 0.5 MG TABLET    Take one tablet by mouth up to 3 times daily as needed for anxiety or sleep   ASPIRIN EC 81 MG TABLET    One daily to help prevent stroke and heart attack   BLOOD GLUCOSE MONITORING SUPPL (BAYER BREEZE 2 SYSTEM) W/DEVICE KIT       CHLORPHENIRAMINE-HYDROCODONE (TUSSIONEX PENNKINETIC ER) 10-8 MG/5ML LQCR    5 cc every 12 hours to control cough   CRANBERRY PO    Take 300 mg by mouth daily.   DESOXIMETASONE (TOPICORT) 0.25 % CREAM    Apply to lesion daily   DIGOXIN (LANOXIN) 0.125 MG TABLET    Take one tablet by mouth on Mondays,Wednedsdays,Fridays and Saturdays   DIPHENHYDRAMINE-ZINC ACETATE (BENADRYL ITCH STOPPING) CREAM    Apply 1 application topically 3 (three) times  daily as needed.   ERGOCALCIFEROL (VITAMIN D2) 50000 UNITS CAPSULE    Take 1 capsule (50,000 Units total) by mouth once a week.   FLUOCINONIDE CREAM (LIDEX) 0.05 %    Apply 1 application topically 2 (two) times daily.   FUROSEMIDE (LASIX) 40 MG TABLET    One daily to control edema   GLUCOSE BLOOD (BAYER BREEZE 2 TEST VI)    Check blood sugar 3 x daily as directed. DX:250.02   HYDROCODONE-ACETAMINOPHEN (NORCO/VICODIN) 5-325 MG TABLET    TAKE 1 TABLET BY MOUTH 4 TIMES A DAY AS NEEDED FOR PAIN.   INSULIN GLARGINE (LANTUS) 100 UNIT/ML INJECTION    Inject 42 Units into the skin at bedtime.   INSULIN LISPRO (HUMALOG KWIKPEN) 100 UNIT/ML KIWKPEN    Inject 10units before breakfast, 12 units before lunch, and 14units before supper to control sugar. May use up to 3 more units per meal as needed   INSULIN PEN NEEDLE 29G X 12.7MM MISC    Use as directed to administer insulin. DX: 250.40   INSULIN SYRINGE-NEEDLE U-100 (EASY COMFORT INSULIN SYRINGE) 30G X 1/2" 0.5 ML MISC    Use as Directed with insulin injections. Dx: 250.00   LANCET DEVICES (LANCING DEVICE) MISC  LEVOTHYROXINE (SYNTHROID, LEVOTHROID) 125 MCG TABLET    Take 1 tablet (125 mcg total) by mouth daily before breakfast.   METOPROLOL (LOPRESSOR) 50 MG TABLET    One twice daily to control heart rate   MULTIPLE VITAMINS-MINERALS (PRESERVISION AREDS 2 PO)    Take by mouth. 1 by mouth two times daily   OMEPRAZOLE (PRILOSEC) 40 MG CAPSULE    Take one capsule by mouth twice daily for stomach   ONDANSETRON (ZOFRAN) 4 MG TABLET    Take 1 tablet (4 mg total) by mouth every 4 (four) hours as needed for nausea or vomiting.   TIZANIDINE (ZANAFLEX) 4 MG TABLET    Take one tablet by mouth three times daily as needed to relax muscles   TRIMETHOPRIM (TRIMPEX) 100 MG TABLET    Take one tablet by mouth every night at bedtime to prevent bladder infection   UREA (CARMOL) 20 % CREAM    Apply daily to the callus on the heels   VITAMIN E PO    Take 400 Units by mouth  daily.  Modified Medications   No medications on file  Discontinued Medications   No medications on file    Review of Systems  Constitutional: Positive for chills and fatigue. Negative for fever, activity change, appetite change and unexpected weight change.  Eyes: Negative.   Respiratory: Negative for shortness of breath and wheezing.   Cardiovascular: Positive for leg swelling. Negative for chest pain and palpitations.       Bilateral mild peripheral arterial disease. She has been evaluated by Dr. Ruta Hinds, vascular surgery, on 06/21/12. He felt that she had bilateral mild peripheral arterial disease. Leg discomfort seems to be more related to her chronic back problems. She has mild external iliac artery stenosis, but does not have severe occlusive disease. No surgical intervention is warranted.  Gastrointestinal: Positive for nausea.       Spastic colon. Intermittent diarrhea. Has more problems with chronic constipation.  Endocrine: Negative for polydipsia, polyphagia and polyuria.       Diabetic. History of thyroid problems. Thyroid has been removed. She is on supplements.  Genitourinary:       Urinary leakage and incontinence. Nocturia. Denies dysuria.  Musculoskeletal: Positive for myalgias, back pain, joint swelling, arthralgias and gait problem.       Complaints of muscular weakness. Chronic backache. Arthritis in other joints. Unsteady gait. Bilateral lower leg discomfort. Bilateral knee pains Enlarged end of the right clavicle at the manubrium. Pain in the right shoulder . Difficulty raising her arm. History of degenerative changes on Xray Dec. 2015.  Skin:       Seborrheic keratosis of the left breast. Generalized itching without rash. Ingrown right great toenail.  Neurological: Positive for tremors and headaches.       Has difficulty with balance. There are episodes of dizziness. She experiences numbness in her feet. She walks wobbly and unsteady. His numbness and  weakness in the left leg.  Hematological: Negative.   Psychiatric/Behavioral:       Chronic anxiety. Sleeps okay.    Filed Vitals:   01/27/16 1205  BP: 142/62  Pulse: 52  Temp: 97.8 F (36.6 C)  Height: _0  (1.651 m)  Weight: 133 lb (60.328 kg)  SpO2: 96%   Body mass index is 22.13 kg/(m^2). Filed Weights   01/27/16 1205  Weight: 133 lb (60.328 kg)     Physical Exam  Constitutional: She is oriented to person, place, and time.  Frail, thin, elderly female.  HENT:  Head: Normocephalic and atraumatic.  Mouth/Throat: No oropharyngeal exudate.  Eyes: Conjunctivae and EOM are normal. Pupils are equal, round, and reactive to light.  Neck: No JVD present. No tracheal deviation present.  Nuchal rigidity. History thyroidectomy.  Cardiovascular: Normal rate and regular rhythm.  Exam reveals no gallop and no friction rub.   No murmur heard. Rate 60 Diminished pedal pulses  Pulmonary/Chest: No respiratory distress. She has no wheezes. She has no rales.  Abdominal: Soft. Bowel sounds are normal. She exhibits no distension and no mass. There is no tenderness.  Musculoskeletal: She exhibits edema (2+ bipedal).  Stiff neck. Right shoulder discomfort on palpation. Unstable gait. Tender processes of the lumbar area. Bilateral lower paraspinal muscular tenderness.  Bulbous, tender knees with increased stiffness and crepitus. Left knee worse than the right.  Lymphadenopathy:    She has no cervical adenopathy.  Neurological: She is alert and oriented to person, place, and time. No cranial nerve deficit. Coordination abnormal.  Difficulty with balance and with walking.  Skin: No rash noted. No erythema. No pallor.  Seborrheic keratosis left breast. Mildly ingrown right great toenail.  Psychiatric: Her behavior is normal. Thought content normal.  Moderate anxiety.    Labs reviewed: Lab Summary Latest Ref Rng 01/18/2016 10/12/2015 06/22/2015 02/17/2015  Hemoglobin 13.0-17.0 g/dL (None)  (None) (None) (None)  Hematocrit 39.0-52.0 % (None) (None) (None) (None)  White count - (None) (None) (None) (None)  Platelet count - (None) (None) (None) (None)  Sodium 134 - 144 mmol/L 144 141 143 143  Potassium 3.5 - 5.2 mmol/L 4.6 4.7 4.8 4.3  Calcium 8.7 - 10.3 mg/dL 9.3 9.0 8.9 9.0  Phosphorus - (None) (None) (None) (None)  Creatinine 0.57 - 1.00 mg/dL 1.57(H) 1.69(H) 1.40(H) 1.44(H)  AST 0 - 40 IU/L (None) 21 26 (None)  Alk Phos 39 - 117 IU/L (None) 97 95 (None)  Bilirubin 0.0 - 1.2 mg/dL (None) 0.3 0.4 (None)  Glucose 65 - 99 mg/dL 76 75 110(H) 75  Cholesterol - (None) (None) (None) (None)  HDL cholesterol >39 mg/dL 31(L) (None) 28(L) 30(L)  Triglycerides 0 - 149 mg/dL 202(H) (None) 167(H) 160(H)  LDL Direct - (None) (None) (None) (None)  LDL Calc 0 - 99 mg/dL 111(H) (None) 92 91  Total protein - (None) (None) (None) (None)  Albumin 3.5 - 4.7 g/dL (None) 4.4 4.0 (None)   Lab Results  Component Value Date   TSH 5.770* 01/18/2016   TSH 5.960* 10/12/2015   TSH 4.270 06/22/2015   Lab Results  Component Value Date   BUN 32* 01/18/2016   BUN 32* 10/12/2015   BUN 20 06/22/2015   Lab Results  Component Value Date   HGBA1C 8.0* 01/18/2016   HGBA1C 7.1* 10/12/2015   HGBA1C 7.2* 06/22/2015    Assessment/Plan  1. Uncontrolled type 2 diabetes mellitus with stage 2 chronic kidney disease, with long-term current use of insulin ([place]) Has modified diet. Continue current medications - Hemoglobin A1c; Future - Comprehensive metabolic panel; Future  2. Need for prophylactic vaccination against Streptococcus pneumoniae (pneumococcus) - Pneumococcal polysaccharide vaccine 23-valent greater than or equal to 2yo subcutaneous/IM  3. Chronic kidney disease, stage III (moderate) - Comprehensive metabolic panel; Future  4. Edema, unspecified type unchanged  5. Essential hypertension - Comprehensive metabolic panel; Future  6. Hypothyroidism, unspecified hypothyroidism  type continue levothyroxine 125 mcg qd - TSH; Future  7. Hyperlipidemia - Lipid panel; Future  8. Spinal stenosis of lumbar region unchnaged  9. Tremor worsening  10. Pain, joint,  knee, left - lidocaine-prilocaine (EMLA) cream; Apply nightly to painful areas  Dispense: 360 g; Refill: 5

## 2016-01-27 NOTE — Telephone Encounter (Signed)
Form Faxed and two sample pens given to patient until the Pens are received from [facility].

## 2016-01-28 NOTE — Telephone Encounter (Signed)
Form faxed to [employer] Patient Assistance Program Fax: [phone]

## 2016-02-09 NOTE — Telephone Encounter (Signed)
Original [employer] form mailed to patient. Patient has to include proof of income and sign. Patient will send back to fax to company.

## 2016-02-09 NOTE — Telephone Encounter (Signed)
Received fax letter from [facility] stating that patient is eligible to receive Lantus through the Patient connection Program until 07/31/2016.

## 2016-02-22 ENCOUNTER — Other Ambulatory Visit: Payer: Self-pay

## 2016-02-22 DIAGNOSIS — F419 Anxiety disorder, unspecified: Secondary | ICD-10-CM

## 2016-02-22 MED ORDER — ALPRAZOLAM 0.5 MG PO TABS: ORAL_TABLET | ORAL | 1 refills | Status: DC

## 2016-02-23 ENCOUNTER — Other Ambulatory Visit: Payer: Self-pay

## 2016-02-23 ENCOUNTER — Other Ambulatory Visit: Payer: Self-pay | Admitting: *Deleted

## 2016-02-23 DIAGNOSIS — F419 Anxiety disorder, unspecified: Secondary | ICD-10-CM

## 2016-02-23 MED ORDER — ALPRAZOLAM 0.5 MG PO TABS: ORAL_TABLET | ORAL | 1 refills | Status: DC

## 2016-02-23 MED ORDER — HYDROCODONE-ACETAMINOPHEN 5-325 MG PO TABS: ORAL_TABLET | ORAL | 0 refills | Status: DC

## 2016-02-23 NOTE — Telephone Encounter (Signed)
Patient requested. Pharmacy did not receive yesterday's refill.

## 2016-02-23 NOTE — Telephone Encounter (Signed)
Prescription printed in error disregard

## 2016-02-25 ENCOUNTER — Other Ambulatory Visit: Payer: Self-pay | Admitting: *Deleted

## 2016-02-25 ENCOUNTER — Telehealth: Payer: Self-pay | Admitting: *Deleted

## 2016-02-25 MED ORDER — INSULIN PEN NEEDLE 31G X 8 MM MISC: 11 refills | Status: DC

## 2016-02-25 NOTE — Telephone Encounter (Signed)
Patient requested to be sent to pharmacy.  

## 2016-02-25 NOTE — Telephone Encounter (Signed)
Spoke with patient and advised that her medication from medication assistance arrived here today. Per pt she will have someone pick it up.  4 boxes with 5 pens each of HUMALOG [facility] exp 08/01/2018 [id number]

## 2016-02-29 ENCOUNTER — Other Ambulatory Visit: Payer: Self-pay | Admitting: Internal Medicine

## 2016-02-29 DIAGNOSIS — E559 Vitamin D deficiency, unspecified: Secondary | ICD-10-CM

## 2016-03-16 DIAGNOSIS — E1165 Type 2 diabetes mellitus with hyperglycemia: Secondary | ICD-10-CM | POA: Diagnosis not present

## 2016-03-23 NOTE — Addendum Note (Signed)
Addended by: Rafael Bihari A on: 03/23/2016 12:11 PM   Modules accepted: Orders

## 2016-03-24 ENCOUNTER — Other Ambulatory Visit: Payer: Self-pay

## 2016-03-24 MED ORDER — HYDROCODONE-ACETAMINOPHEN 5-325 MG PO TABS: ORAL_TABLET | ORAL | 0 refills | Status: DC

## 2016-03-25 ENCOUNTER — Other Ambulatory Visit: Payer: Self-pay | Admitting: *Deleted

## 2016-03-25 DIAGNOSIS — F419 Anxiety disorder, unspecified: Secondary | ICD-10-CM

## 2016-03-25 MED ORDER — HYDROCODONE-ACETAMINOPHEN 5-325 MG PO TABS: ORAL_TABLET | ORAL | 0 refills | Status: DC

## 2016-03-25 MED ORDER — ALPRAZOLAM 0.5 MG PO TABS: ORAL_TABLET | ORAL | 1 refills | Status: DC

## 2016-03-25 NOTE — Telephone Encounter (Signed)
Patient requested and will pick up. Rx printed yesterday and placed for Karen [name] to sign but not signed. Reprinted for Dr. Eulas Post to sign.

## 2016-04-05 ENCOUNTER — Encounter: Payer: Self-pay | Admitting: Internal Medicine

## 2016-04-05 ENCOUNTER — Ambulatory Visit (INDEPENDENT_AMBULATORY_CARE_PROVIDER_SITE_OTHER): Payer: Medicare Other | Admitting: Internal Medicine

## 2016-04-05 DIAGNOSIS — M1712 Unilateral primary osteoarthritis, left knee: Secondary | ICD-10-CM | POA: Diagnosis not present

## 2016-04-05 NOTE — Progress Notes (Signed)
Facility  [facility]    Place of Service:   OFFICE    Allergies  Allergen Reactions  . Celebrex [Celecoxib] Rash  . Doxycycline Rash  . Lyrica [Pregabalin] Rash  . Avandia [Rosiglitazone]   . Macrodantin [Nitrofurantoin Macrocrystal]   . Sulfa Antibiotics   . Vioxx [Rofecoxib]   . Silicone Rash    Gets rash from the touch it.    Chief Complaint  Patient presents with  . Acute Visit    pain in both knees, left one will lock up on her, can't move for 15 minutes. Wants to talk about getting knee injections.    HPI:   Right knee last injected on 10/14/15.  Having increased pain in the left knee. No injury or fall known. Increased crepitance. Known DJD of both knees.  Medications: Patient's Medications  New Prescriptions   No medications on file  Previous Medications   AFLURIA SUSP       ALPRAZOLAM (XANAX) 0.5 MG TABLET    Take one tablet by mouth up to 3 times daily as needed for anxiety or sleep   ASPIRIN EC 81 MG TABLET    One daily to help prevent stroke and heart attack   BLOOD GLUCOSE MONITORING SUPPL (BAYER BREEZE 2 SYSTEM) W/DEVICE KIT       CHLORPHENIRAMINE-HYDROCODONE (TUSSIONEX PENNKINETIC ER) 10-8 MG/5ML LQCR    5 cc every 12 hours to control cough   CRANBERRY PO    Take 300 mg by mouth daily.   DESOXIMETASONE (TOPICORT) 0.25 % CREAM    Apply to lesion daily   DIGOXIN (LANOXIN) 0.125 MG TABLET    Take one tablet by mouth on Mondays,Wednedsdays,Fridays and Saturdays   DIPHENHYDRAMINE-ZINC ACETATE (BENADRYL ITCH STOPPING) CREAM    Apply 1 application topically 3 (three) times daily as needed.   FLUOCINONIDE CREAM (LIDEX) 0.05 %    Apply 1 application topically 2 (two) times daily.   FUROSEMIDE (LASIX) 40 MG TABLET    One daily to control edema   GLUCOSE BLOOD (BAYER BREEZE 2 TEST VI)    Check blood sugar 3 x daily as directed. DX:250.02   HYDROCODONE-ACETAMINOPHEN (NORCO/VICODIN) 5-325 MG TABLET    TAKE 1 TABLET BY MOUTH 4 TIMES A DAY AS NEEDED FOR PAIN.   INSULIN  GLARGINE (LANTUS) 100 UNIT/ML INJECTION    Inject 42 Units into the skin at bedtime.   INSULIN LISPRO (HUMALOG KWIKPEN) 100 UNIT/ML KIWKPEN    Inject 10units before breakfast, 12 units before lunch, and 14units before supper to control sugar. May use up to 3 more units per meal as needed   INSULIN PEN NEEDLE (B-D ULTRAFINE III SHORT PEN) 31G X 8 MM MISC    Use with administering insulin. Dx. E11.29   INSULIN PEN NEEDLE 29G X 12.7MM MISC    Use as directed to administer insulin. DX: 250.40   INSULIN SYRINGE-NEEDLE U-100 (EASY COMFORT INSULIN SYRINGE) 30G X 1/2" 0.5 ML MISC    Use as Directed with insulin injections. Dx: 250.00   LANCET DEVICES (LANCING DEVICE) MISC       LEVOTHYROXINE (SYNTHROID, LEVOTHROID) 125 MCG TABLET    Take 1 tablet (125 mcg total) by mouth daily before breakfast.   LIDOCAINE-PRILOCAINE (EMLA) CREAM    Apply nightly to painful areas   METOPROLOL (LOPRESSOR) 50 MG TABLET    One twice daily to control heart rate   MULTIPLE VITAMINS-MINERALS (PRESERVISION AREDS 2 PO)    Take by mouth. 1 by mouth two times daily  OMEPRAZOLE (PRILOSEC) 40 MG CAPSULE    Take one capsule by mouth twice daily for stomach   ONDANSETRON (ZOFRAN) 4 MG TABLET    Take 1 tablet (4 mg total) by mouth every 4 (four) hours as needed for nausea or vomiting.   TIZANIDINE (ZANAFLEX) 4 MG TABLET    Take one tablet by mouth three times daily as needed to relax muscles   TRIMETHOPRIM (TRIMPEX) 100 MG TABLET    Take one tablet by mouth every night at bedtime to prevent bladder infection   UREA (CARMOL) 20 % CREAM    Apply daily to the callus on the heels   VITAMIN D, ERGOCALCIFEROL, (DRISDOL) 50000 UNITS CAPS CAPSULE    TAKE 1 CAPSULE BY MOUTH ONCE A WEEK   VITAMIN E PO    Take 400 Units by mouth daily.  Modified Medications   No medications on file  Discontinued Medications   No medications on file    Review of Systems  Constitutional: Positive for chills and fatigue. Negative for activity change, appetite  change, fever and unexpected weight change.  Eyes: Negative.   Respiratory: Negative for shortness of breath and wheezing.   Cardiovascular: Positive for leg swelling. Negative for chest pain and palpitations.       Bilateral mild peripheral arterial disease. She has been evaluated by Dr. Fabienne Bruns, vascular surgery, on 06/21/12. He felt that she had bilateral mild peripheral arterial disease. Leg discomfort seems to be more related to her chronic back problems. She has mild external iliac artery stenosis, but does not have severe occlusive disease. No surgical intervention is warranted.  Gastrointestinal: Positive for nausea.       Spastic colon. Intermittent diarrhea. Has more problems with chronic constipation.  Endocrine: Negative for polydipsia, polyphagia and polyuria.       Diabetic. History of thyroid problems. Thyroid has been removed. She is on supplements.  Genitourinary:       Urinary leakage and incontinence. Nocturia. Denies dysuria.  Musculoskeletal: Positive for arthralgias, back pain, gait problem, joint swelling and myalgias.       Complaints of muscular weakness. Chronic backache. Arthritis in other joints. Unsteady gait. Bilateral lower leg discomfort. Bilateral knee pains Enlarged end of the right clavicle at the manubrium. Pain in the right shoulder . Difficulty raising her arm. History of degenerative changes on Xray Dec. 2015.  Skin:       Seborrheic keratosis of the left breast. Generalized itching without rash. Ingrown right great toenail.  Neurological: Positive for tremors and headaches.       Has difficulty with balance. There are episodes of dizziness. She experiences numbness in her feet. She walks wobbly and unsteady. His numbness and weakness in the left leg.  Hematological: Negative.   Psychiatric/Behavioral:       Chronic anxiety. Sleeps okay.    Vitals:   04/05/16 1521  BP: (!) 158/64  Pulse: (!) 54  Temp: 97.8 F (36.6 C)  TempSrc: Oral    SpO2: 95%  Weight: 133 lb (60.3 kg)  Height: 5\' 5"  (1.651 m)   Body mass index is 22.13 kg/m. Wt Readings from Last 3 Encounters:  04/05/16 133 lb (60.3 kg)  01/27/16 133 lb (60.3 kg)  10/14/15 135 lb (61.2 kg)      Physical Exam  Constitutional: She is oriented to person, place, and time.  Frail, thin, elderly female.  HENT:  Head: Normocephalic and atraumatic.  Mouth/Throat: No oropharyngeal exudate.  Eyes: Conjunctivae and EOM are normal. Pupils  are equal, round, and reactive to light.  Neck: No JVD present. No tracheal deviation present.  Nuchal rigidity. History thyroidectomy.  Cardiovascular: Normal rate and regular rhythm.  Exam reveals no gallop and no friction rub.   No murmur heard. Rate 60 Diminished pedal pulses  Pulmonary/Chest: No respiratory distress. She has no wheezes. She has no rales.  Abdominal: Soft. Bowel sounds are normal. She exhibits no distension and no mass. There is no tenderness.  Musculoskeletal: She exhibits edema (2+ bipedal).  Stiff neck. Right shoulder discomfort on palpation. Unstable gait. Tender processes of the lumbar area. Bilateral lower paraspinal muscular tenderness.  Bulbous, tender knees with increased stiffness and crepitus. Left knee worse than the right.  Lymphadenopathy:    She has no cervical adenopathy.  Neurological: She is alert and oriented to person, place, and time. No cranial nerve deficit. Coordination abnormal.  Difficulty with balance and with walking.  Skin: No rash noted. No erythema. No pallor.  Seborrheic keratosis left breast. Mildly ingrown right great toenail.  Psychiatric: Her behavior is normal. Thought content normal.  Moderate anxiety.    Labs reviewed: Lab Summary Latest Ref Rng & Units 01/18/2016 10/12/2015 06/22/2015  Hemoglobin 13.0-17.0 g/dL (None) (None) (None)  Hematocrit 39.0-52.0 % (None) (None) (None)  White count - (None) (None) (None)  Platelet count - (None) (None) (None)  Sodium 134 -  144 mmol/L 144 141 143  Potassium 3.5 - 5.2 mmol/L 4.6 4.7 4.8  Calcium 8.7 - 10.3 mg/dL 9.3 9.0 8.9  Phosphorus - (None) (None) (None)  Creatinine 0.57 - 1.00 mg/dL 1.57(H) 1.69(H) 1.40(H)  AST 0 - 40 IU/L (None) 21 26  Alk Phos 39 - 117 IU/L (None) 97 95  Bilirubin 0.0 - 1.2 mg/dL (None) 0.3 0.4  Glucose 65 - 99 mg/dL 76 75 110(H)  Cholesterol - (None) (None) (None)  HDL cholesterol >39 mg/dL 31(L) (None) 28(L)  Triglycerides 0 - 149 mg/dL 202(H) (None) 167(H)  LDL Direct - (None) (None) (None)  LDL Calc 0 - 99 mg/dL 111(H) (None) 92  Total protein - (None) (None) (None)  Albumin 3.5 - 4.7 g/dL (None) 4.4 4.0  Some recent data might be hidden   Lab Results  Component Value Date   TSH 5.770 (H) 01/18/2016   TSH 5.960 (H) 10/12/2015   TSH 4.270 06/22/2015   Lab Results  Component Value Date   BUN 32 (H) 01/18/2016   BUN 32 (H) 10/12/2015   BUN 20 06/22/2015   Lab Results  Component Value Date   HGBA1C 8.0 (H) 01/18/2016   HGBA1C 7.1 (H) 10/12/2015   HGBA1C 7.2 (H) 06/22/2015    Assessment/Plan  1. Primary osteoarthritis of left knee Injected 2 cc 1% lidocaine and Kenalog -40 1cc.  Tolerated procedure and felt better immediately.

## 2016-04-06 DIAGNOSIS — M1712 Unilateral primary osteoarthritis, left knee: Secondary | ICD-10-CM | POA: Diagnosis not present

## 2016-04-06 MED ORDER — TRIAMCINOLONE ACETONIDE 40 MG/ML IJ SUSP
40.0000 mg | Freq: Once | INTRAMUSCULAR | Status: AC
  Administered 2016-04-06: 40 mg via INTRA_ARTICULAR

## 2016-04-06 MED ORDER — TRIAMCINOLONE ACETONIDE 10 MG/ML IJ SUSP: 10.0000 mg | Freq: Once | INTRAMUSCULAR | Status: DC

## 2016-04-06 NOTE — Addendum Note (Signed)
Addended by: Ripley Fraise on: 04/06/2016 03:45 PM   Modules accepted: Orders

## 2016-04-13 ENCOUNTER — Other Ambulatory Visit: Payer: Self-pay | Admitting: Internal Medicine

## 2016-04-22 ENCOUNTER — Telehealth: Payer: Self-pay | Admitting: *Deleted

## 2016-04-22 NOTE — Telephone Encounter (Signed)
Received fax from [facility] [phone] for Patient Assistance Program Refill Request for her Lantus. Filled out form and faxed back to [facility] Fax#: [phone]

## 2016-04-25 ENCOUNTER — Other Ambulatory Visit: Payer: Self-pay | Admitting: *Deleted

## 2016-04-25 MED ORDER — HYDROCODONE-ACETAMINOPHEN 5-325 MG PO TABS
ORAL_TABLET | ORAL | 0 refills | Status: DC
Start: 2016-04-25 — End: 2016-05-30

## 2016-04-25 MED ORDER — HYDROCODONE-ACETAMINOPHEN 5-325 MG PO TABS
ORAL_TABLET | ORAL | 0 refills | Status: DC
Start: 2016-04-25 — End: 2016-04-25

## 2016-04-25 NOTE — Telephone Encounter (Signed)
Karen [name]. Patient will pick up

## 2016-04-25 NOTE — Telephone Encounter (Signed)
Phone just rang, no answering machine, will leave rx at the front desk for pick-up

## 2016-04-28 ENCOUNTER — Telehealth: Payer: Self-pay | Admitting: *Deleted

## 2016-04-28 NOTE — Telephone Encounter (Signed)
Spoke with patient and advised medication came in and will be stored in the refrigerator.

## 2016-05-02 ENCOUNTER — Other Ambulatory Visit: Payer: Self-pay | Admitting: Internal Medicine

## 2016-05-02 DIAGNOSIS — E039 Hypothyroidism, unspecified: Secondary | ICD-10-CM

## 2016-05-04 ENCOUNTER — Emergency Department (HOSPITAL_COMMUNITY)
Admission: EM | Admit: 2016-05-04 | Discharge: 2016-05-04 | Disposition: A | Discharge status: Home / Self Care | Payer: Medicare Other | Attending: Emergency Medicine | Admitting: Emergency Medicine

## 2016-05-04 ENCOUNTER — Encounter (HOSPITAL_COMMUNITY): Payer: Self-pay | Admitting: Emergency Medicine

## 2016-05-04 ENCOUNTER — Telehealth: Payer: Self-pay | Admitting: *Deleted

## 2016-05-04 ENCOUNTER — Emergency Department (HOSPITAL_COMMUNITY): Payer: Medicare Other

## 2016-05-04 DIAGNOSIS — Z8673 Personal history of transient ischemic attack (TIA), and cerebral infarction without residual deficits: Secondary | ICD-10-CM | POA: Insufficient documentation

## 2016-05-04 DIAGNOSIS — W19XXXA Unspecified fall, initial encounter: Secondary | ICD-10-CM | POA: Insufficient documentation

## 2016-05-04 DIAGNOSIS — I130 Hypertensive heart and chronic kidney disease with heart failure and stage 1 through stage 4 chronic kidney disease, or unspecified chronic kidney disease: Secondary | ICD-10-CM | POA: Diagnosis not present

## 2016-05-04 DIAGNOSIS — E1122 Type 2 diabetes mellitus with diabetic chronic kidney disease: Secondary | ICD-10-CM | POA: Insufficient documentation

## 2016-05-04 DIAGNOSIS — M25572 Pain in left ankle and joints of left foot: Secondary | ICD-10-CM | POA: Diagnosis not present

## 2016-05-04 DIAGNOSIS — Y999 Unspecified external cause status: Secondary | ICD-10-CM | POA: Insufficient documentation

## 2016-05-04 DIAGNOSIS — M25562 Pain in left knee: Secondary | ICD-10-CM | POA: Diagnosis not present

## 2016-05-04 DIAGNOSIS — E039 Hypothyroidism, unspecified: Secondary | ICD-10-CM | POA: Diagnosis not present

## 2016-05-04 DIAGNOSIS — S99922A Unspecified injury of left foot, initial encounter: Secondary | ICD-10-CM | POA: Diagnosis not present

## 2016-05-04 DIAGNOSIS — Z85828 Personal history of other malignant neoplasm of skin: Secondary | ICD-10-CM | POA: Insufficient documentation

## 2016-05-04 DIAGNOSIS — Y929 Unspecified place or not applicable: Secondary | ICD-10-CM | POA: Diagnosis not present

## 2016-05-04 DIAGNOSIS — S79911A Unspecified injury of right hip, initial encounter: Secondary | ICD-10-CM | POA: Diagnosis not present

## 2016-05-04 DIAGNOSIS — I509 Heart failure, unspecified: Secondary | ICD-10-CM | POA: Diagnosis not present

## 2016-05-04 DIAGNOSIS — N183 Chronic kidney disease, stage 3 (moderate): Secondary | ICD-10-CM | POA: Diagnosis not present

## 2016-05-04 DIAGNOSIS — M25551 Pain in right hip: Secondary | ICD-10-CM | POA: Diagnosis not present

## 2016-05-04 DIAGNOSIS — M25512 Pain in left shoulder: Secondary | ICD-10-CM | POA: Diagnosis not present

## 2016-05-04 DIAGNOSIS — M545 Low back pain: Secondary | ICD-10-CM | POA: Diagnosis not present

## 2016-05-04 DIAGNOSIS — S4992XA Unspecified injury of left shoulder and upper arm, initial encounter: Secondary | ICD-10-CM | POA: Diagnosis not present

## 2016-05-04 DIAGNOSIS — M79671 Pain in right foot: Secondary | ICD-10-CM | POA: Diagnosis not present

## 2016-05-04 DIAGNOSIS — S79912A Unspecified injury of left hip, initial encounter: Secondary | ICD-10-CM | POA: Diagnosis not present

## 2016-05-04 DIAGNOSIS — Y939 Activity, unspecified: Secondary | ICD-10-CM | POA: Diagnosis not present

## 2016-05-04 DIAGNOSIS — S8252XA Displaced fracture of medial malleolus of left tibia, initial encounter for closed fracture: Secondary | ICD-10-CM | POA: Insufficient documentation

## 2016-05-04 DIAGNOSIS — M79672 Pain in left foot: Secondary | ICD-10-CM | POA: Diagnosis not present

## 2016-05-04 DIAGNOSIS — M25552 Pain in left hip: Secondary | ICD-10-CM | POA: Diagnosis not present

## 2016-05-04 DIAGNOSIS — M7989 Other specified soft tissue disorders: Secondary | ICD-10-CM | POA: Diagnosis not present

## 2016-05-04 LAB — CBG MONITORING, ED: Glucose-Capillary: 146 mg/dL — ABNORMAL HIGH (ref 65–99)

## 2016-05-04 LAB — I-STAT CHEM 8, ED
BUN: 32 mg/dL — AB (ref 6–20)
CREATININE: 1.2 mg/dL — AB (ref 0.44–1.00)
Calcium, Ion: 1.13 mmol/L — ABNORMAL LOW (ref 1.15–1.40)
Chloride: 101 mmol/L (ref 101–111)
GLUCOSE: 170 mg/dL — AB (ref 65–99)
HEMATOCRIT: 37 % (ref 36.0–46.0)
HEMOGLOBIN: 12.6 g/dL (ref 12.0–15.0)
POTASSIUM: 5.1 mmol/L (ref 3.5–5.1)
Sodium: 142 mmol/L (ref 135–145)
TCO2: 34 mmol/L (ref 0–100)

## 2016-05-04 NOTE — Care Management Note (Signed)
Case Management Note  Patient Details  Name: Karen [name] MRN: [id number] Date of Birth: 11-14-1923  Subjective/Objective: Patient presents to ED post fall                   Action/Plan: [facility] spoke to patient and her son [name] at bedside.  Patient lives alone. Her son lives near patient.  [facility] phone number [phone].  Patient has a walker, a 3 in 1 and shower chair at home.  Patient expresses interest in wheelchair.  EDCM provided patient with list of home health agencies in [facility].  Patient has chosen [facility].  EDCM also provided patient wit h contact information for [facility], [facility] [place], and [facility].  EDCM also provided a list of private duty nursing services and explained it would be an out of pocket expense.  Patient would like her wheelchair to come from [facility].  EDCM explained to patient that she will have to pick up wheelchair from the [facility] store.  Patient's son verbalized understanding.  Patient and family thankful for services.  No further EDCM needs at this time.  Patient also agreeable to [facility] consult.  [facility] consult placed   Expected Discharge Date:                  Expected Discharge Plan:  [place]  In-House Referral:     Discharge planning Services  CM Consult  Post Acute Care Choice:  Durable Medical Equipment, Home Health Choice offered to:  Patient, Adult Children  DME Arranged:  Wheelchair manual DME Agency:  [place]:  RN, PT, OT, Nurse's Aide, Social Work [employer] Agency:  [place]  Status of Service:  Completed, signed off  If discussed at [employer], dates discussed:    Additional Comments:  [place] faxed home health services referral to [facility] with confirmation of receipt.  Christen Bame, Ehsan Corvin, RN 05/04/2016, 9:46 PM

## 2016-05-04 NOTE — Progress Notes (Signed)
Patient suffers from ankle fracture which impairs their ability to perform daily activities like bathing, dressing, feeding, grooming and toileting in the home. A cane, crutch or walker will not resolve  issue with performing activities of daily living. A wheelchair will allow patient to safely perform daily activities. Patient is not able to propel themselves in the home using a standard weight wheelchair due to arm weakness and endurance. Patient can self propel in the lightweight wheelchair.  Accessories: elevating leg rests (ELRs), wheel locks, extensions and anti-tippers.

## 2016-05-04 NOTE — ED Notes (Addendum)
Pt's daughter at bedside requesting to move pt out of [address] bed. States in very rude tone that she "would not bring her mother to this hospital if she knew that pt will be placed in hall way".  This writer attempted to explain that we have no room open at this time and that pt will be placed in room whenever one will come available.  Daughter has been extremely rude stating to this nurse that pt ids 82 years old , she cannot understand the Probation officer and that Probation officer has an "accent." Pt is alert and oriented x 4. Pt apologized for her daughter behavior.

## 2016-05-04 NOTE — ED Triage Notes (Signed)
Patient states she was getting out of her chair and her left leg/knee "gave way." Patient denies dizziness prior to falling. Denies hitting head while falling. Patient takes aspirin 325. Denies other blood thinner use. Patient is alert and oriented x4. No neuro deficits noted during triage. Patient complaining of left knee, hip, ankle, foot, shoulder & right foot pain.

## 2016-05-04 NOTE — Telephone Encounter (Signed)
Patient fell this morning and ankle swollen and bruised, Shoulder pain and both hips hurting. We have no appointments available. Trouble walking. Advised daughter to take patient to Urgent Care to be evaluated and x-rayed. She agreed.

## 2016-05-04 NOTE — ED Provider Notes (Signed)
Emergency Department Provider Note   I have reviewed the triage vital signs and the nursing notes.   HISTORY  Chief Complaint Fall   HPI Karen [name] is a 82 y.o. female with PMH of a-fib, arthritis, CHF, CKD, and GERD presents to the emergency pertinent for evaluation of mechanical fall. Patient states that she was standing in her left knee just "gave out." She reports pain and occasional weakness in the knee at baseline. She denies any trauma to the head or face. No loss of consciousness. No vomiting since the incident. She is not on blood thinners. She denies any chest pain, palpitations, sweating, or difficulty breathing prior to the event. She has pain primarily in her left ankle and knee. Also complaining of some lower back pain and shoulder pain. Pain worse with movement.    Past Medical History:  Diagnosis Date  . A-fib ([place])   . Abnormal involuntary movements(781.0)   . Anemia   . Anxiety   . Anxiety state, unspecified   . Arthritis    body, neck  . Benign hypertensive heart disease   . Benign hypertensive heart disease with congestive heart failure ([place])   . Bladder disorder    over active  . Cancer ([place])    skin, removed  . Cervicalgia   . CHF (congestive heart failure) ([place])   . Chronic kidney disease, stage III (moderate)   . Congestive heart failure, unspecified   . Contact dermatitis and other eczema, due to unspecified cause   . Corns and callosities   . Diarrhea   . Dyspnea 02/18/2015  . Edema   . Esophageal stricture   . Gastroparesis   . Gastroparesis   . GERD (gastroesophageal reflux disease)   . Hiatal hernia   . Hyperlipidemia   . Inflamed seborrheic keratosis   . Inflammatory disease of breast   . Irregular heartbeat   . Lumbago   . Mixed incontinence urge and stress (female)(female)   . Nausea alone   . Nonspecific (abnormal) findings on radiological and other examination of skull and head   . Osteoarthrosis, unspecified whether  generalized or localized, unspecified site   . Other and unspecified hyperlipidemia   . Other B-complex deficiencies   . Other functional disorder of bladder   . Other malaise and fatigue   . Other specified visual disturbances   . Pain in joint, lower leg   . Palpitations   . Peripheral vascular disease, unspecified   . Postmenopausal atrophic vaginitis   . Reflux esophagitis   . Seborrheic keratosis   . Sleep apnea   . Spasm of muscle   . Spinal stenosis   . Spinal stenosis, unspecified region other than cervical   . Stricture and stenosis of esophagus   . Stroke ([place])   . TIA (transient ischemic attack) 2012  . Transient ischemic attack (TIA), and cerebral infarction without residual deficits(V12.54)   . Type II or unspecified type diabetes mellitus with renal manifestations, not stated as uncontrolled(250.40)   . Unspecified constipation   . Unspecified essential hypertension   . Unspecified hereditary and idiopathic peripheral neuropathy   . Unspecified hypothyroidism   . Unspecified pruritic disorder   . Unspecified sleep apnea   . Unspecified vitamin D deficiency   . Urinary tract infection, site not specified   . Vitamin B12 deficiency     Patient Active Problem List   Diagnosis Date Noted  . Left knee DJD 04/05/2016  . Pain in right knee 10/14/2015  .  Ingrown nail 10/14/2015  . Pain of right heel 02/18/2015  . Dyspnea 02/18/2015  . Hyperlipidemia   . Tachycardia 12/24/2014  . Pain in right shoulder 09/23/2014  . Edema 05/20/2014  . Obstructive sleep apnea 05/20/2014  . Nausea 10/16/2013  . Pain, joint, knee, left 10/16/2013  . Immunization deficiency 07/16/2013  . Cramp in muscle 05/15/2013  . Chronic kidney disease, stage III (moderate) 05/15/2013  . Osteoarthritis of both knees 05/15/2013  . Tremor 05/15/2013  . Varicose veins of lower extremities with other complications 59/16/3846  . Essential hypertension 12/17/2012  . Pruritic disorder 12/17/2012    . Spinal stenosis of lumbar region 12/17/2012  . Type 2 diabetes mellitus, uncontrolled, with renal complications ([place])   . Acid reflux   . Anxiety   . Hypothyroidism   . Seborrheic keratosis   . Dysphagia 10/09/2012  . SOB (shortness of breath) 10/09/2012  . Peripheral vascular disease ([place]) 06/21/2012    Past Surgical History:  Procedure Laterality Date  . ABDOMINAL HYSTERECTOMY    . BACK SURGERY     x 2  . CHOLECYSTECTOMY  1991  . esophageal  2004,2006   dilation, Dr Lyla Son  . [place]   bilateral cataract  . hammer toes    . HERNIA REPAIR  1984aaaaaaaa  . SKIN CANCER DESTRUCTION    . THYROIDECTOMY, PARTIAL  1965    Current Outpatient Rx  . Order #: [id number] Class: Historical Med  . Order #: [id number] Class: Print  . Order #: [id number] Class: No Print  . Order #: [id number] Class: Historical Med  . Order #: [id number] Class: Print  . Order #: [id number] Class: Historical Med  . Order #: [id number] Class: Print  . Order #: [id number] Class: Normal  . Order #: [id number] Class: Historical Med  . Order #: [id number] Class: Historical Med  . Order #: [id number] Class: Normal  . Order #: [id number] Class: Historical Med  . Order #: [id number] Class: Print  . Order #: [id number] Class: Historical Med  . Order #: [id number] Class: Print  . Order #: [id number] Class: Normal  . Order #: [id number] Class: Normal  . Order #: [id number] Class: Normal  . Order #: [id number] Class: Historical Med  . Order #: [id number] Class: Normal  . Order #: [id number] Class: No Print  . Order #: [id number] Class: Normal  . Order #: [id number] Class: Historical Med  . Order #: [id number] Class: Phone In  . Order #: [id number] Class: Normal  . Order #: [id number] Class: Normal  . Order #: [id number] Class: Phone In  . Order #: [id number] Class: Normal  . Order #: [id number] Class: Normal  . Order #: [id number] Class: Historical Med    Allergies Celebrex [celecoxib]; Doxycycline; Lyrica [pregabalin]; Avandia  [rosiglitazone]; Macrodantin [nitrofurantoin macrocrystal]; Sulfa antibiotics; Vioxx [rofecoxib]; and Silicone  Family History  Problem Relation Age of Onset  . Cancer Mother     ? stomach  . Heart disease Father   . Hyperlipidemia Father   . Hypertension Father   . Heart attack Father   . Heart disease Brother   . Kidney disease Daughter   . Heart disease Brother   . Cancer Son     Social History Social History  Substance Use Topics  . Smoking status: Never Smoker  . Smokeless tobacco: Never Used  . Alcohol use No    Review of Systems  Constitutional: No fever/chills Eyes: No visual changes. ENT: No sore throat. Cardiovascular: Denies chest pain. Respiratory: Denies shortness of breath. Gastrointestinal: No abdominal pain.  No nausea, no vomiting.  No diarrhea.  No constipation. Genitourinary: Negative for dysuria. Musculoskeletal: Positive for  back pain, hip pain, left knee pain, left ankle pain.  Skin: Negative for rash. Neurological: Negative for headaches, focal weakness or numbness.  10-point ROS otherwise negative.  ____________________________________________   PHYSICAL EXAM:  VITAL SIGNS: ED Triage Vitals  Enc Vitals Group     BP 05/04/16 1630 170/74     Pulse Rate 05/04/16 1630 63     Resp 05/04/16 1630 22     Temp 05/04/16 1630 98.1 F (36.7 C)     Temp Source 05/04/16 1630 Oral     SpO2 --      Weight 05/04/16 1637 133 lb (60.3 kg)     Height 05/04/16 1637 5\' 5"  (1.651 m)     Pain Score 05/04/16 1637 10    Constitutional: Alert and oriented. Well appearing and in no acute distress. Eyes: Conjunctivae are normal. PERRL.  Head: Atraumatic. Nose: No congestion/rhinnorhea. Mouth/Throat: Mucous membranes are moist.  Oropharynx non-erythematous. Neck: No stridor. No cervical spine tenderness to palpation. Cardiovascular: Normal rate, regular rhythm. Good peripheral circulation. Grossly normal heart sounds.   Respiratory: Normal respiratory  effort.  No retractions. Lungs CTAB. Gastrointestinal: Soft and nontender. No distention.  Musculoskeletal: Left knee and ankle swelling. Normal ROM of bilateral hips. Normal ROM of both wrists. No clear deformity other than mild swelling.  Neurologic:  Normal speech and language. No gross focal neurologic deficits are appreciated.  Skin:  Skin is warm, dry and intact. No rash noted. Psychiatric: Mood and affect are normal. Speech and behavior are normal.  ____________________________________________   LABS (all labs ordered are listed, but only abnormal results are displayed)  Labs Reviewed  I-STAT CHEM 8, ED - Abnormal; Notable for the following:       Result Value   BUN 32 (*)    Creatinine, Ser 1.20 (*)    Glucose, Bld 170 (*)    Calcium, Ion 1.13 (*)    All other components within normal limits  CBG MONITORING, ED - Abnormal; Notable for the following:    Glucose-Capillary 146 (*)    All other components within normal limits   ____________________________________________  EKG   EKG Interpretation  Date/Time:  Wednesday May 04 2016 21:12:46 EDT Ventricular Rate:  66 PR Interval:    QRS Duration: 110 QT Interval:  389 QTC Calculation: 408 R Axis:   94 Text Interpretation:  Sinus rhythm Anterior infarct, old Borderline ST depression, lateral leads No STEMI.  Compared to prior.  Confirmed by Tuere Nwosu MD, Devra Stare [phone]) on 05/04/2016 9:31:41 PM       ____________________________________________  RADIOLOGY  Dg Ankle Complete Left  Result Date: 05/04/2016 CLINICAL DATA:  Pain after fall EXAM: LEFT ANKLE COMPLETE - 3+ VIEW COMPARISON:  None. FINDINGS: Soft tissue swelling about the malleoli. There is a tiny flake like avulsion fracture off the medial malleolus age indeterminate in the absence of prior studies. Small phlebolith in the medial left leg. Ankle mortise appears intact. Calcaneal enthesophytes along the plantar and dorsal aspect. Small enthesophyte at the base of  the fifth metatarsal. Soft tissue calcification along the plantar aspect of the midfoot. IMPRESSION: Age-indeterminate tiny avulsion fracture suggested off the medial malleolus. Calcaneal and fifth metatarsal base enthesophytes. Electronically Signed   By: Ashley Royalty M.D.   On: 05/04/2016 19:20   Dg Ankle Complete Right  Result Date: 05/04/2016 CLINICAL DATA:  Pain after fall. Patient states her legs gave out from under. EXAM: RIGHT ANKLE - COMPLETE 3+ VIEW COMPARISON:  Right foot radiographs from 12/06/2013 FINDINGS: There  is soft tissue swelling about the malleoli. The ankle mortise appears intact. Small subcortical calcaneal enthesophytes are noted. Old fracture deformity of the fifth metatarsal base. Bones are demineralized in appearance. Lucencies of the talar dome are identified possibly representing areas degenerative subchondral cystic change versus areas of osteochondritis dissecans. Vascular calcifications are noted crossing the ankle joint dorsally. IMPRESSION: Soft tissue swelling without acute osseous abnormality. Calcaneal enthesophytes. Old fracture deformity of the fifth metatarsal base. Tiny subchondral talar dome lucencies may reflect change attributable to osteoarthritis with differential possibility of osteochondritis dissecans. Electronically Signed   By: Ashley Royalty M.D.   On: 05/04/2016 19:16   Dg Shoulder Left  Result Date: 05/04/2016 CLINICAL DATA:  Pain after fall EXAM: LEFT SHOULDER - 2+ VIEW COMPARISON:  10/09/2012 and 12/26/2010 chest radiographs. FINDINGS: There is AC joint osteoarthritis with slight lateral downsloping of the acromion which can narrow the impingement space for the rotator cuff. Tiny calcification seen adjacent to the superolateral aspect of the humeral head may reflect calcific rotator cuff tendinopathy. There is osteoarthritic spurring along the inferior rim of the glenoid. There is a linear lucency involving the glenoid spur noted demonstrates sclerotic  margins and may reflect an old injury. The visualized left lung is emphysematous but without pneumothorax or pulmonary consolidation. The adjacent ribs are intact. IMPRESSION: Osteoarthritis of the AC and glenohumeral joints. Faint linear lucency with sclerotic appearing margins involving and inferior glenoid osteophyte may represent sequela of an old remote glenoid fracture. Hyperinflated appearance of the adjacent left lung. Electronically Signed   By: Ashley Royalty M.D.   On: 05/04/2016 19:35   Dg Knee Complete 4 Views Left  Result Date: 05/04/2016 CLINICAL DATA:  Pain after fall EXAM: LEFT KNEE - COMPLETE 4+ VIEW COMPARISON:  04/03/2014 FINDINGS: Tricompartmental osteoarthritis is noted most prominently affecting the medial femorotibial compartment with spurring off the medial femoral condyle and tibial plateau. There is chondrocalcinosis of hyaline cartilage. There is stable tibial spine hypertrophy and spurring medially. No acute displaced fracture nor bone destruction is seen. Bony spurring at the tibial tuberosity and upper pole of the patella. No significant joint effusion. Femoral and popliteal liteal arteriosclerosis. IMPRESSION: Tricompartmental osteoarthritis.  No acute osseous abnormality. Electronically Signed   By: Ashley Royalty M.D.   On: 05/04/2016 19:24   Dg Foot Complete Left  Result Date: 05/04/2016 CLINICAL DATA:  82 y/o  F; status post fall with pain. EXAM: LEFT FOOT - COMPLETE 3+ VIEW COMPARISON:  None. FINDINGS: Left foot: Bones are demineralized. No acute fracture or dislocation is identified. Vascular calcifications. Plantar calcaneal enthesophyte. Lisfranc alignment is preserved. IMPRESSION: Negative. Electronically Signed   By: Kristine Garbe M.D.   On: 05/04/2016 19:12   Dg Foot Complete Right  Result Date: 05/04/2016 CLINICAL DATA:  Pain after fall EXAM: RIGHT FOOT COMPLETE - 3+ VIEW COMPARISON:  None. FINDINGS: The bones are osteopenic. The distal phalanx of the  great toe is upturned in appearance. There is degenerative joint space narrowing of the second through fifth DIP and PIP joints, interphalangeal joint of the great toe, mtp joints and base of the metatarsals bowel. No evidence of acute fracture. There may be old fifth metatarsal base fracture with linear lucency seen but sclerotic appearing margins. Calcaneal enthesophytes are seen along the plantar and dorsal aspect. There is slight pes cavus. IMPRESSION: Osteopenia without acute osseous abnormality. Calcaneal enthesophytes. Pes cavus. Electronically Signed   By: Ashley Royalty M.D.   On: 05/04/2016 19:27   Dg Hips Bilat W  Or Wo Pelvis 2 Views  Result Date: 05/04/2016 CLINICAL DATA:  Pain after fall. EXAM: DG HIP (WITH OR WITHOUT PELVIS) 2V BILAT COMPARISON:  None. FINDINGS: There is spurring of the acetabular components of both native hips. Slight joint space narrowing of both hips. No acute displaced fracture. Left gluteal soft tissue ossific densities may reflect sequela of gluteal tendinopathy with spurring off the left lesser trochanter at the iliopsoas tendon insertion. There is partial visualization of lumbar fixation hardware with pedicle screws noted at L4. There appears to be degenerative disc disease at L4 and L5 with facet arthropathy. The pubic symphysis is maintained. No acute appearing abnormality of the SI joints. No soft tissue masses. IMPRESSION: No acute osseous abnormality of the bony pelvis and hips. Degenerative spurring and slight joint space narrowing of both hip joints. Electronically Signed   By: Ashley Royalty M.D.   On: 05/04/2016 19:40    ____________________________________________   PROCEDURES  Procedure(s) performed:   Procedures  None ____________________________________________   INITIAL IMPRESSION / ASSESSMENT AND PLAN / ED COURSE  Pertinent labs & imaging results that were available during my care of the patient were reviewed by me and considered in my medical  decision making (see chart for details).  Patient resents to the emergency department for apparent mechanical fall where her left leg gave out. She has swelling around her left ankle and knee. She has pain in other joints. Plan for imaging of these areas. No head trauma, nausea, or vomiting. The patient is at her mental status baseline. Will add on baseline labs and EKG.   08:45 pm Patient with question avulsion fracture over the medial malleolus. She is actually more tender along the lateral malleolus. Plan to place the patient in an Aircast for ease of use. Discussed case manager who is arranging home physical therapy, occupational therapy, nursing care, social work, and home wheelchair. Appreciate assistance with this case. Labs pending.  Labs unremarkable. Discharged with home health and DME as noted above. Discussed plan with patient and son at bedside who is in agreement with plan and follow up.  ____________________________________________  FINAL CLINICAL IMPRESSION(S) / ED DIAGNOSES  Final diagnoses:  Fall, initial encounter  Acute left ankle pain     Note:  This document was prepared using Dragon voice recognition software and may include unintentional dictation errors.  Nanda [place], MD Emergency Medicine   Margette Fast, MD 05/04/16 [phone]

## 2016-05-04 NOTE — Discharge Instructions (Signed)
You were seen in the ED today after a fall. Your labs and EKG were normal. The x-ray of the left ankle showed a small fracture but you are not having pain in this exact area so this could be old. To be safe we are placing you in an air cast and have ordered home health, PT/OT, and orthopedic surgery referral. You will need to call the orthopedist in the morning to schedule the appointment.   Return to the ED with any nausea, vomiting, sudden severe headache, or worsening pain in the ankle. Take Tylenol for pain and keep the ankle elevated to help improve swelling.

## 2016-05-06 ENCOUNTER — Other Ambulatory Visit: Payer: Self-pay | Admitting: *Deleted

## 2016-05-06 ENCOUNTER — Encounter: Payer: Self-pay | Admitting: *Deleted

## 2016-05-06 DIAGNOSIS — S8252XA Displaced fracture of medial malleolus of left tibia, initial encounter for closed fracture: Secondary | ICD-10-CM | POA: Diagnosis not present

## 2016-05-06 NOTE — Patient Outreach (Signed)
[facility]) Care Management [place] Telephone Outreach 05/06/2016  Karen [name] 06-Feb-1924 [id number]  Successful telephone outreach to Karen [name], 82 y/o female referred to [place] by ED provider for care coordination and community resource needs assessment after patient's recent ED visit on May 04, 2016 for mechanical fall; patient incurred questionable avulsion fracture of (L) medial malleolus.  Patient has history of DM, HTN, PVD, GERD, CKD stage III, HLD, OSA, SOB, and hypothyroidism.  HIPAA/ identity verified, and verbal consent for [facility] Community CM services obtained.  Today, patient states that she does not have long to talk as she is on her way to a follow up appointment with her orthopedic provider.  Patient provides an accurate report of her recent ED visit, and states that she has a very supportive family that assist her with her care needs.  Patient states that her son Karen [name] will be transporting her to her appointment today, which is scheduled at 12 noon.  Patient confirms that she has heard from [facility]) services, which was ordered after her recent ED visit, and she reports that [facility] services have scheduled an appointment to visit her at her home on Tuesday May 10, 2016.  [facility] CM services were discussed with patient, with difference between [facility] and [facility] CM explained; patient verbalized understanding.  Patient asked that I call her back "next week" as she needs to leave for her scheduled orthopedic appointment this afternoon.  Plan:  Patient will attend all scheduled provider appointments and will work with [facility] services as ordered.  [place] outreach to continue with scheduled phone call next week.   Oneta Rack, RN, BSN, [facility] Care Management  [phone]

## 2016-05-09 ENCOUNTER — Other Ambulatory Visit: Payer: Self-pay | Admitting: *Deleted

## 2016-05-09 NOTE — Patient Outreach (Signed)
[facility]) Care Management [place] Telephone Outreach 05/09/2016  ANNALEE MEYERHOFF 1924/03/23 [id number]   Unsuccessful telephone outreach to [name], son/caregiver of Mylena Sedberry, 82 y/o female referred to [place] by ED provider for care coordination and community resource needs assessment after patient's recent ED visit on May 04, 2016 for mechanical fall; patient incurred questionable avulsion fracture of (L) medial malleolus.  Patient had provided verbal consent for me to speak with [name] during previous telephone outreach.  Patient has history of DM, HTN, PVD, GERD, CKD stage III, HLD, OSA, SOB, and hypothyroidism.    I received voice mail from [name] this morning, which was left off-hours, stating that he was planning to see me at patient's home this morning, Monday, May 09, 2016; I do not have a scheduled appointment for an in-home visit with patient this morning, and left [name] a HIPAA compliant voice mail message making him aware of same.  I provided my direct contact information on the voice mail message I left, should [name] have questions.  Plan:  [place] outreach to continue with scheduled phone call to patient later this week.   Oneta Rack, RN, BSN, [employer] [facility] Care Management  [phone]

## 2016-05-11 ENCOUNTER — Other Ambulatory Visit: Payer: Medicare Other

## 2016-05-11 DIAGNOSIS — E785 Hyperlipidemia, unspecified: Secondary | ICD-10-CM | POA: Diagnosis not present

## 2016-05-11 DIAGNOSIS — I130 Hypertensive heart and chronic kidney disease with heart failure and stage 1 through stage 4 chronic kidney disease, or unspecified chronic kidney disease: Secondary | ICD-10-CM | POA: Diagnosis not present

## 2016-05-11 DIAGNOSIS — IMO0002 Reserved for concepts with insufficient information to code with codable children: Secondary | ICD-10-CM

## 2016-05-11 DIAGNOSIS — E1165 Type 2 diabetes mellitus with hyperglycemia: Secondary | ICD-10-CM

## 2016-05-11 DIAGNOSIS — E039 Hypothyroidism, unspecified: Secondary | ICD-10-CM

## 2016-05-11 DIAGNOSIS — I10 Essential (primary) hypertension: Secondary | ICD-10-CM | POA: Diagnosis not present

## 2016-05-11 DIAGNOSIS — Z8673 Personal history of transient ischemic attack (TIA), and cerebral infarction without residual deficits: Secondary | ICD-10-CM | POA: Diagnosis not present

## 2016-05-11 DIAGNOSIS — I509 Heart failure, unspecified: Secondary | ICD-10-CM | POA: Diagnosis not present

## 2016-05-11 DIAGNOSIS — Z794 Long term (current) use of insulin: Secondary | ICD-10-CM

## 2016-05-11 DIAGNOSIS — M25572 Pain in left ankle and joints of left foot: Secondary | ICD-10-CM | POA: Diagnosis not present

## 2016-05-11 DIAGNOSIS — N182 Chronic kidney disease, stage 2 (mild): Secondary | ICD-10-CM

## 2016-05-11 DIAGNOSIS — N183 Chronic kidney disease, stage 3 unspecified: Secondary | ICD-10-CM

## 2016-05-11 DIAGNOSIS — I739 Peripheral vascular disease, unspecified: Secondary | ICD-10-CM | POA: Diagnosis not present

## 2016-05-11 DIAGNOSIS — E1122 Type 2 diabetes mellitus with diabetic chronic kidney disease: Secondary | ICD-10-CM

## 2016-05-11 DIAGNOSIS — Z9181 History of falling: Secondary | ICD-10-CM | POA: Diagnosis not present

## 2016-05-11 LAB — COMPREHENSIVE METABOLIC PANEL
ALBUMIN: 3.9 g/dL (ref 3.6–5.1)
ALT: 29 U/L (ref 6–29)
AST: 33 U/L (ref 10–35)
Alkaline Phosphatase: 101 U/L (ref 33–130)
BILIRUBIN TOTAL: 0.5 mg/dL (ref 0.2–1.2)
BUN: 30 mg/dL — AB (ref 7–25)
CO2: 27 mmol/L (ref 20–31)
CREATININE: 1.72 mg/dL — AB (ref 0.60–0.88)
Calcium: 8.9 mg/dL (ref 8.6–10.4)
Chloride: 101 mmol/L (ref 98–110)
GLUCOSE: 129 mg/dL — AB (ref 65–99)
Potassium: 4.6 mmol/L (ref 3.5–5.3)
SODIUM: 139 mmol/L (ref 135–146)
Total Protein: 6.5 g/dL (ref 6.1–8.1)

## 2016-05-11 LAB — LIPID PANEL
CHOL/HDL RATIO: 5.4 ratio — AB (ref <=5.0)
Cholesterol: 147 mg/dL (ref 125–200)
HDL: 27 mg/dL — ABNORMAL LOW (ref >=46)
LDL CALC: 92 mg/dL (ref <130)
Triglycerides: 140 mg/dL (ref <150)
VLDL: 28 mg/dL (ref <30)

## 2016-05-11 LAB — TSH: TSH: 1.47 mIU/L

## 2016-05-12 ENCOUNTER — Encounter: Payer: Self-pay | Admitting: *Deleted

## 2016-05-12 ENCOUNTER — Other Ambulatory Visit: Payer: Self-pay | Admitting: *Deleted

## 2016-05-12 DIAGNOSIS — I130 Hypertensive heart and chronic kidney disease with heart failure and stage 1 through stage 4 chronic kidney disease, or unspecified chronic kidney disease: Secondary | ICD-10-CM | POA: Diagnosis not present

## 2016-05-12 DIAGNOSIS — M25572 Pain in left ankle and joints of left foot: Secondary | ICD-10-CM | POA: Diagnosis not present

## 2016-05-12 DIAGNOSIS — I509 Heart failure, unspecified: Secondary | ICD-10-CM | POA: Diagnosis not present

## 2016-05-12 DIAGNOSIS — E1122 Type 2 diabetes mellitus with diabetic chronic kidney disease: Secondary | ICD-10-CM | POA: Diagnosis not present

## 2016-05-12 DIAGNOSIS — N183 Chronic kidney disease, stage 3 (moderate): Secondary | ICD-10-CM | POA: Diagnosis not present

## 2016-05-12 DIAGNOSIS — Z8673 Personal history of transient ischemic attack (TIA), and cerebral infarction without residual deficits: Secondary | ICD-10-CM | POA: Diagnosis not present

## 2016-05-12 DIAGNOSIS — Z794 Long term (current) use of insulin: Secondary | ICD-10-CM | POA: Diagnosis not present

## 2016-05-12 DIAGNOSIS — I739 Peripheral vascular disease, unspecified: Secondary | ICD-10-CM | POA: Diagnosis not present

## 2016-05-12 DIAGNOSIS — Z9181 History of falling: Secondary | ICD-10-CM | POA: Diagnosis not present

## 2016-05-12 LAB — HEMOGLOBIN A1C
Hgb A1c MFr Bld: 6.2 % — ABNORMAL HIGH (ref <5.7)
MEAN PLASMA GLUCOSE: 131 mg/dL

## 2016-05-12 NOTE — Progress Notes (Signed)
Next OV 05/17/16.

## 2016-05-12 NOTE — Patient Outreach (Signed)
[facility]) Care Management [place] Telephone Outreach 05/12/2016  Karen [name] [birth year] [id number]  Unsuccessful telephone outreach to Karen [name], 82 y/o female referred to [place] by ED provider for care coordination and community resource needs assessment after patient's recent ED visit on May 04, 2016 for mechanical fall; patient incurred questionable avulsion fracture of (L) medial malleolus.  Patient has history of DM, HTN, PVD, GERD, CKD stage III, HLD, OSA, SOB, and hypothyroidism.  Patient was discharged from ED with home health [facility]) services.  Today's call attempt was answered by automated outgoing voicemail message that said, "enter your remote access code," and would not allow me to leave the patient a message.  Plan:  Will re-attempt [place] outreach with scheduled phone call attempt tomorrow.  Oneta Rack, RN, BSN, [employer] [facility] Care Management  [phone]

## 2016-05-13 ENCOUNTER — Encounter: Payer: Self-pay | Admitting: *Deleted

## 2016-05-13 ENCOUNTER — Other Ambulatory Visit: Payer: Self-pay | Admitting: *Deleted

## 2016-05-13 NOTE — Patient Outreach (Signed)
[facility]) Care Management [place] Telephone Outreach 05/13/2016  [name] 1923-11-17 [id number]  Successful telephone outreach to Karen [name], 82 y/o female referred to [place] by ED provider for care coordination and community resource needs assessment after patient's recent ED visit on May 04, 2016 for mechanical fall; patient incurred questionable avulsion fracture of (L) medial malleolus.  Patient has history of DM, HTN, PVD, GERD, CKD stage III, HLD, OSA, SOB, and hypothyroidism.  Home health [facility]) services for PT were ordered at time of discharge from ED.  HIPAA/ identity verified, and verbal consent for [place] services again obtained.  Today, patient states that she "is doing fine" after her recent ED visit.    --wearing leg brace, applying ice, using walker/ cane around home, staying off feet "as much as possible," taking pain medication as needed.  Pain in (L) knee primary issue secondary to chronic arthritis; patient states ankle pain is manageable.  Attended orthopedic follow up appointment last week, will be in brace for 3 weeks, then will return for x-ray.  [place] PT in progress, "going well," patient states she has had 2 PT sessions, with next scheduled on Monday May 16, 2016.  Denies new falls since ED visit.  -- Medications:  Patient reports that she has all of her medications and takes as they are prescribed.  Patient states uses weekly pill box, which she manages independently.  -- Upcoming provider appointments reviewed with patient today; patient reports accurate report and verbalizes plans to attend all scheduled appointments.   -- Denies current need for community resources, stating that her family/ friends/ church members assist her with needs for meals, transportation, etc.  Patient reports "plenty of support."  [facility] CM services were again discussed with patient, with difference between [facility] and [facility] CM  explained; patient verbalized understanding/ agreement.  [place] initial home visit scheduled today for next week; patient denies further concerns, problems, issues or questions today.  I provided patient with my direct phone number, the main [facility] CM phone number, and the [facility] 24-hour nurse line phone number.   Plan:  Patient will attend all scheduled provider appointments and will work with [facility] services as ordered.  Patient will continue using her cane/ walker for mobility around her home.  Patient will promptly notify her care providers for any concerns, issues, or problems that arise.   [place] outreach to continue with scheduled in-home visit next week.  Karen Rack, RN, BSN, [employer] [facility] Care Management  [phone]

## 2016-05-16 DIAGNOSIS — I509 Heart failure, unspecified: Secondary | ICD-10-CM | POA: Diagnosis not present

## 2016-05-16 DIAGNOSIS — Z9181 History of falling: Secondary | ICD-10-CM | POA: Diagnosis not present

## 2016-05-16 DIAGNOSIS — I739 Peripheral vascular disease, unspecified: Secondary | ICD-10-CM | POA: Diagnosis not present

## 2016-05-16 DIAGNOSIS — Z794 Long term (current) use of insulin: Secondary | ICD-10-CM | POA: Diagnosis not present

## 2016-05-16 DIAGNOSIS — M25572 Pain in left ankle and joints of left foot: Secondary | ICD-10-CM | POA: Diagnosis not present

## 2016-05-16 DIAGNOSIS — E1122 Type 2 diabetes mellitus with diabetic chronic kidney disease: Secondary | ICD-10-CM | POA: Diagnosis not present

## 2016-05-16 DIAGNOSIS — N183 Chronic kidney disease, stage 3 (moderate): Secondary | ICD-10-CM | POA: Diagnosis not present

## 2016-05-16 DIAGNOSIS — Z8673 Personal history of transient ischemic attack (TIA), and cerebral infarction without residual deficits: Secondary | ICD-10-CM | POA: Diagnosis not present

## 2016-05-16 DIAGNOSIS — I130 Hypertensive heart and chronic kidney disease with heart failure and stage 1 through stage 4 chronic kidney disease, or unspecified chronic kidney disease: Secondary | ICD-10-CM | POA: Diagnosis not present

## 2016-05-16 NOTE — Progress Notes (Signed)
05/16/2016 1750pm  EDCM called and spoke to patient for follow up.  Patient reports sheis doing"fair."  She reports she has followed up with the orthopedic specialist and has another appointment in 3 weeks for repeat xray.  She reports she continues to ice her foot.  She confirms that [facility] has been to the home to see and "they are good."  [facility] asked patient if she has received her wheelchair?  Patient reports she has not, but reports "The doctor said he was going to put one in."  [facility] informed patient that if she feels that she needed a wheelchair sooner, she may contact local good will or [employer] stores or churches to obtain one at a low cost.  Patient verbalized understanding and is thankful for follow up phone call.  No further EDCM needs at this time.

## 2016-05-17 ENCOUNTER — Ambulatory Visit (INDEPENDENT_AMBULATORY_CARE_PROVIDER_SITE_OTHER): Payer: Medicare Other | Admitting: Internal Medicine

## 2016-05-17 ENCOUNTER — Encounter: Payer: Self-pay | Admitting: Internal Medicine

## 2016-05-17 VITALS — BP 140/64 | HR 54 | Temp 98.1°F | Ht 65.0 in | Wt 132.0 lb

## 2016-05-17 DIAGNOSIS — R609 Edema, unspecified: Secondary | ICD-10-CM | POA: Diagnosis not present

## 2016-05-17 DIAGNOSIS — E1122 Type 2 diabetes mellitus with diabetic chronic kidney disease: Secondary | ICD-10-CM | POA: Diagnosis not present

## 2016-05-17 DIAGNOSIS — Z794 Long term (current) use of insulin: Secondary | ICD-10-CM

## 2016-05-17 DIAGNOSIS — R1310 Dysphagia, unspecified: Secondary | ICD-10-CM

## 2016-05-17 DIAGNOSIS — K219 Gastro-esophageal reflux disease without esophagitis: Secondary | ICD-10-CM

## 2016-05-17 DIAGNOSIS — E1165 Type 2 diabetes mellitus with hyperglycemia: Secondary | ICD-10-CM | POA: Diagnosis not present

## 2016-05-17 DIAGNOSIS — R251 Tremor, unspecified: Secondary | ICD-10-CM

## 2016-05-17 DIAGNOSIS — N182 Chronic kidney disease, stage 2 (mild): Secondary | ICD-10-CM

## 2016-05-17 DIAGNOSIS — N183 Chronic kidney disease, stage 3 unspecified: Secondary | ICD-10-CM

## 2016-05-17 DIAGNOSIS — E039 Hypothyroidism, unspecified: Secondary | ICD-10-CM | POA: Diagnosis not present

## 2016-05-17 DIAGNOSIS — S8255XA Nondisplaced fracture of medial malleolus of left tibia, initial encounter for closed fracture: Secondary | ICD-10-CM

## 2016-05-17 DIAGNOSIS — I10 Essential (primary) hypertension: Secondary | ICD-10-CM | POA: Diagnosis not present

## 2016-05-17 DIAGNOSIS — IMO0002 Reserved for concepts with insufficient information to code with codable children: Secondary | ICD-10-CM

## 2016-05-17 DIAGNOSIS — L299 Pruritus, unspecified: Secondary | ICD-10-CM | POA: Diagnosis not present

## 2016-05-17 DIAGNOSIS — M25562 Pain in left knee: Secondary | ICD-10-CM

## 2016-05-17 MED ORDER — PRIMIDONE 250 MG PO TABS: ORAL_TABLET | ORAL | 5 refills | Status: DC

## 2016-05-17 NOTE — Progress Notes (Signed)
Facility  [place]    Place of Service:   OFFICE    Allergies  Allergen Reactions  . Celebrex [Celecoxib] Rash  . Doxycycline Rash  . Lyrica [Pregabalin] Rash  . Avandia [Rosiglitazone]   . Macrodantin [Nitrofurantoin Macrocrystal]   . Sulfa Antibiotics   . Vioxx [Rofecoxib]   . Silicone Rash    Gets rash from the touch it.    Chief Complaint  Patient presents with  . Medical Management of Chronic Issues    4 month medication management blood pressure, blood sugar, cholesterol, thyorid, review labs.  . Fall    05/04/16 went to ED -"left knee gave out", fell.    HPI:  Had small chip fracture of the left medial malleolus. Seeing Dr. Laverta Baltimore in the ER 05/04/16 and Dr. Geoffry Paradise at [facility]. Wearing a splint at the left ankle and lower leg.  Tremor - Tremor noted to be worse for the last 2 months. Has become bad enough she would like to take something for it. She is already using metoprolol and Alprazolam for there problems. Both seem to help, but not enough.  Uncontrolled type 2 diabetes mellitus with stage 2 chronic kidney disease, with long-term current use of insulin (HCC) - lower A1c. Better control since last seen.  Pain, joint, knee, left - tricompartment OA  Hypothyroidism, unspecified type - compensated. Normal TSH on 125 mcg levothyroxine.  Essential hypertension - controlled  Edema, unspecified type - mild increase since last seen  Chronic kidney disease, stage III (moderate) - slightly increased creat and BUN  Gastroesophageal reflux disease, esophagitis presence not specified - heartburn about 1-2 days weekly  Dysphagia, unspecified type - improved. Still with occasional episodes og choking. No episodes of aspiration.  Pruritic disorder - improved    Medications: Patient's Medications  New Prescriptions   No medications on file  Previous Medications   AFLURIA SUSP       ALPRAZOLAM (XANAX) 0.5 MG TABLET    Take one tablet by mouth up to 3 times daily as needed for  anxiety or sleep   ASPIRIN EC 81 MG TABLET    One daily to help prevent stroke and heart attack   BLOOD GLUCOSE MONITORING SUPPL (BAYER BREEZE 2 SYSTEM) W/DEVICE KIT       CHLORPHENIRAMINE-HYDROCODONE (TUSSIONEX PENNKINETIC ER) 10-8 MG/5ML LQCR    5 cc every 12 hours to control cough   CRANBERRY PO    Take 300 mg by mouth daily.   [employer] NATURAL LUTEIN EYE HEALTH PO    Take by mouth. One tablet once daily for eyes   DESOXIMETASONE (TOPICORT) 0.25 % CREAM    Apply to lesion daily   DIGOXIN (LANOXIN) 0.125 MG TABLET    Take one tablet by mouth on Mondays,Wednedsdays,Fridays and Saturdays   DIPHENHYDRAMINE-ZINC ACETATE (BENADRYL ITCH STOPPING) CREAM    Apply 1 application topically 3 (three) times daily as needed.   FLUOCINONIDE CREAM (LIDEX) 0.05 %    Apply 1 application topically 2 (two) times daily.   FUROSEMIDE (LASIX) 40 MG TABLET    One daily to control edema   GLUCOSE BLOOD (BAYER BREEZE 2 TEST VI)    Check blood sugar 3 x daily as directed. DX:250.02   HYDROCODONE-ACETAMINOPHEN (NORCO/VICODIN) 5-325 MG TABLET    TAKE 1 TABLET BY MOUTH 4 TIMES A DAY AS NEEDED FOR PAIN.   INSULIN GLARGINE (LANTUS) 100 UNIT/ML INJECTION    Inject 42 Units into the skin at bedtime.   INSULIN LISPRO (HUMALOG KWIKPEN)  100 UNIT/ML KIWKPEN    Inject 10units before breakfast, 12 units before lunch, and 14units before supper to control sugar. May use up to 3 more units per meal as needed   INSULIN PEN NEEDLE (B-D ULTRAFINE III SHORT PEN) 31G X 8 MM MISC    Use with administering insulin. Dx. E11.29   INSULIN PEN NEEDLE 29G X 12.7MM MISC    Use as directed to administer insulin. DX: 250.40   INSULIN SYRINGE-NEEDLE U-100 (EASY COMFORT INSULIN SYRINGE) 30G X 1/2" 0.5 ML MISC    Use as Directed with insulin injections. Dx: 250.00   LANCET DEVICES (LANCING DEVICE) MISC       LEVOTHYROXINE (SYNTHROID, LEVOTHROID) 125 MCG TABLET    TAKE 1 TABLET BY MOUTH DAILY BEFORE BREAKFAST   LIDOCAINE-PRILOCAINE (EMLA) CREAM    Apply  nightly to painful areas   METOPROLOL (LOPRESSOR) 50 MG TABLET    One twice daily to control heart rate   MULTIPLE VITAMINS-MINERALS (PRESERVISION AREDS 2 PO)    Take by mouth. 1 by mouth two times daily   OMEPRAZOLE (PRILOSEC) 40 MG CAPSULE    Take one capsule by mouth twice daily for stomach   ONDANSETRON (ZOFRAN) 4 MG TABLET    TAKE 1 TABLET EVERY 4 HOURS AS NEEDED FOR NAUSEA/VOMITING   POLYETHYL GLYCOL-PROPYL GLYCOL (SYSTANE OP)    Apply to eye. One drop both eyes twice daily for dry eyes   TIZANIDINE (ZANAFLEX) 4 MG TABLET    Take one tablet by mouth three times daily as needed to relax muscles   TRIMETHOPRIM (TRIMPEX) 100 MG TABLET    Take one tablet by mouth every night at bedtime to prevent bladder infection   UREA (CARMOL) 20 % CREAM    Apply daily to the callus on the heels   VITAMIN D, ERGOCALCIFEROL, (DRISDOL) 50000 UNITS CAPS CAPSULE    TAKE 1 CAPSULE BY MOUTH ONCE A WEEK   VITAMIN E PO    Take 400 Units by mouth daily.  Modified Medications   No medications on file  Discontinued Medications   No medications on file    Review of Systems  Constitutional: Positive for chills and fatigue. Negative for activity change, appetite change, fever and unexpected weight change.  Eyes: Negative.   Respiratory: Negative for shortness of breath and wheezing.   Cardiovascular: Positive for leg swelling. Negative for chest pain and palpitations.       Bilateral mild peripheral arterial disease. She has been evaluated by Dr. Ruta Hinds, vascular surgery, on 06/21/12. He felt that she had bilateral mild peripheral arterial disease. Leg discomfort seems to be more related to her chronic back problems. She has mild external iliac artery stenosis, but does not have severe occlusive disease. No surgical intervention is warranted.  Gastrointestinal: Positive for nausea.       Spastic colon. Intermittent diarrhea. Has more problems with chronic constipation. Mild dysphagia.  Endocrine: Negative  for polydipsia, polyphagia and polyuria.       Diabetic. History of thyroid problems. Thyroid has been removed. She is on supplements.  Genitourinary:       Urinary leakage and incontinence. Nocturia. Denies dysuria.  Musculoskeletal: Positive for arthralgias, back pain, gait problem, joint swelling and myalgias.       Complaints of muscular weakness. Chronic backache. Arthritis in other joints. Unsteady gait. Bilateral lower leg discomfort. Bilateral knee pains Enlarged end of the right clavicle at the manubrium. Pain in the right shoulder . Difficulty raising her arm. History of  degenerative changes on Xray Dec. 2015. Left knee "gave out" and caused her to fall 05/04/16. Was seen at ED. tricompartment OA on xrays.  Skin:       Seborrheic keratosis of the left breast. Generalized itching without rash. Ingrown right great toenail.  Neurological: Positive for tremors and headaches.       Has difficulty with balance. There are episodes of dizziness. She experiences numbness in her feet. She walks wobbly and unsteady. His numbness and weakness in the left leg.  Hematological: Negative.   Psychiatric/Behavioral:       Chronic anxiety. Sleeps okay.    Vitals:   05/17/16 1611  BP: 140/64  Pulse: (!) 54  Temp: 98.1 F (36.7 C)  TempSrc: Oral  SpO2: 94%  Weight: 132 lb (59.9 kg)  Height: '5\' 5"'$  (1.651 m)   Body mass index is 21.97 kg/m. Wt Readings from Last 3 Encounters:  05/17/16 132 lb (59.9 kg)  05/04/16 133 lb (60.3 kg)  04/05/16 133 lb (60.3 kg)      Physical Exam  Constitutional: She is oriented to person, place, and time.  Frail, thin, elderly female.  HENT:  Head: Normocephalic and atraumatic.  Mouth/Throat: No oropharyngeal exudate.  Eyes: Conjunctivae and EOM are normal. Pupils are equal, round, and reactive to light.  Neck: No JVD present. No tracheal deviation present.  Nuchal rigidity. History thyroidectomy.  Cardiovascular: Normal rate and regular rhythm.   Exam reveals no gallop and no friction rub.   No murmur heard. Rate 60 Diminished pedal pulses  Pulmonary/Chest: No respiratory distress. She has no wheezes. She has no rales.  Abdominal: Soft. Bowel sounds are normal. She exhibits no distension and no mass. There is no tenderness.  Musculoskeletal: She exhibits edema (2+ bipedal).  Stiff neck. Right shoulder discomfort on palpation. Unstable gait. Tender processes of the lumbar area. Bilateral lower paraspinal muscular tenderness.  Bulbous, tender knees with increased stiffness and crepitus. Left knee worse than the right.  Lymphadenopathy:    She has no cervical adenopathy.  Neurological: She is alert and oriented to person, place, and time. No cranial nerve deficit. Coordination abnormal.  Difficulty with balance and with walking.  Skin: No rash noted. No erythema. No pallor.  Seborrheic keratosis left breast. Mildly ingrown right great toenail.  Psychiatric: Her behavior is normal. Thought content normal.  Moderate anxiety.    Labs reviewed: Lab Summary Latest Ref Rng & Units 05/11/2016 05/04/2016 01/18/2016 10/12/2015  Hemoglobin 12.0 - 15.0 g/dL (None) 12.6 (None) (None)  Hematocrit 36.0 - 46.0 % (None) 37.0 (None) (None)  White count - (None) (None) (None) (None)  Platelet count - (None) (None) (None) (None)  Sodium 135 - 146 mmol/L 139 142 144 141  Potassium 3.5 - 5.3 mmol/L 4.6 5.1 4.6 4.7  Calcium 8.6 - 10.4 mg/dL 8.9 (None) 9.3 9.0  Phosphorus - (None) (None) (None) (None)  Creatinine 0.60 - 0.88 mg/dL 1.72(H) 1.20(H) 1.57(H) 1.69(H)  AST 10 - 35 U/L 33 (None) (None) 21  Alk Phos 33 - 130 U/L 101 (None) (None) 97  Bilirubin 0.2 - 1.2 mg/dL 0.5 (None) (None) 0.3  Glucose 65 - 99 mg/dL 129(H) 170(H) 76 75  Cholesterol 125 - 200 mg/dL 147 (None) (None) (None)  HDL cholesterol >=46 mg/dL 27(L) (None) 31(L) (None)  Triglycerides <150 mg/dL 140 (None) 202(H) (None)  LDL Direct - (None) (None) (None) (None)  LDL Calc <130  mg/dL 92 (None) 111(H) (None)  Total protein 6.1 - 8.1 g/dL 6.5 (None) (None) (None)  Albumin 3.6 - 5.1 g/dL 3.9 (None) (None) 4.4  Some recent data might be hidden   Lab Results  Component Value Date   TSH 1.47 05/11/2016   TSH 5.770 (H) 01/18/2016   TSH 5.960 (H) 10/12/2015   Lab Results  Component Value Date   BUN 30 (H) 05/11/2016   BUN 32 (H) 05/04/2016   BUN 32 (H) 01/18/2016   Lab Results  Component Value Date   HGBA1C 6.2 (H) 05/11/2016   HGBA1C 8.0 (H) 01/18/2016   HGBA1C 7.1 (H) 10/12/2015   Dg Ankle Complete Left  Result Date: 05/04/2016 CLINICAL DATA:  Pain after fall EXAM: LEFT ANKLE COMPLETE - 3+ VIEW COMPARISON:  None. FINDINGS: Soft tissue swelling about the malleoli. There is a tiny flake like avulsion fracture off the medial malleolus age indeterminate in the absence of prior studies. Small phlebolith in the medial left leg. Ankle mortise appears intact. Calcaneal enthesophytes along the plantar and dorsal aspect. Small enthesophyte at the base of the fifth metatarsal. Soft tissue calcification along the plantar aspect of the midfoot. IMPRESSION: Age-indeterminate tiny avulsion fracture suggested off the medial malleolus. Calcaneal and fifth metatarsal base enthesophytes. Electronically Signed   By: Tollie Eth M.D.   On: 05/04/2016 19:20   Dg Ankle Complete Right  Result Date: 05/04/2016 CLINICAL DATA:  Pain after fall. Patient states her legs gave out from under. EXAM: RIGHT ANKLE - COMPLETE 3+ VIEW COMPARISON:  Right foot radiographs from 12/06/2013 FINDINGS: There is soft tissue swelling about the malleoli. The ankle mortise appears intact. Small subcortical calcaneal enthesophytes are noted. Old fracture deformity of the fifth metatarsal base. Bones are demineralized in appearance. Lucencies of the talar dome are identified possibly representing areas degenerative subchondral cystic change versus areas of osteochondritis dissecans. Vascular calcifications are noted  crossing the ankle joint dorsally. IMPRESSION: Soft tissue swelling without acute osseous abnormality. Calcaneal enthesophytes. Old fracture deformity of the fifth metatarsal base. Tiny subchondral talar dome lucencies may reflect change attributable to osteoarthritis with differential possibility of osteochondritis dissecans. Electronically Signed   By: Tollie Eth M.D.   On: 05/04/2016 19:16   Dg Shoulder Left  Result Date: 05/04/2016 CLINICAL DATA:  Pain after fall EXAM: LEFT SHOULDER - 2+ VIEW COMPARISON:  10/09/2012 and 12/26/2010 chest radiographs. FINDINGS: There is AC joint osteoarthritis with slight lateral downsloping of the acromion which can narrow the impingement space for the rotator cuff. Tiny calcification seen adjacent to the superolateral aspect of the humeral head may reflect calcific rotator cuff tendinopathy. There is osteoarthritic spurring along the inferior rim of the glenoid. There is a linear lucency involving the glenoid spur noted demonstrates sclerotic margins and may reflect an old injury. The visualized left lung is emphysematous but without pneumothorax or pulmonary consolidation. The adjacent ribs are intact. IMPRESSION: Osteoarthritis of the AC and glenohumeral joints. Faint linear lucency with sclerotic appearing margins involving and inferior glenoid osteophyte may represent sequela of an old remote glenoid fracture. Hyperinflated appearance of the adjacent left lung. Electronically Signed   By: Tollie Eth M.D.   On: 05/04/2016 19:35   Dg Knee Complete 4 Views Left  Result Date: 05/04/2016 CLINICAL DATA:  Pain after fall EXAM: LEFT KNEE - COMPLETE 4+ VIEW COMPARISON:  04/03/2014 FINDINGS: Tricompartmental osteoarthritis is noted most prominently affecting the medial femorotibial compartment with spurring off the medial femoral condyle and tibial plateau. There is chondrocalcinosis of hyaline cartilage. There is stable tibial spine hypertrophy and spurring medially. No  acute displaced fracture nor  bone destruction is seen. Bony spurring at the tibial tuberosity and upper pole of the patella. No significant joint effusion. Femoral and popliteal liteal arteriosclerosis. IMPRESSION: Tricompartmental osteoarthritis.  No acute osseous abnormality. Electronically Signed   By: Ashley Royalty M.D.   On: 05/04/2016 19:24   Dg Foot Complete Left  Result Date: 05/04/2016 CLINICAL DATA:  82 y/o  F; status post fall with pain. EXAM: LEFT FOOT - COMPLETE 3+ VIEW COMPARISON:  None. FINDINGS: Left foot: Bones are demineralized. No acute fracture or dislocation is identified. Vascular calcifications. Plantar calcaneal enthesophyte. Lisfranc alignment is preserved. IMPRESSION: Negative. Electronically Signed   By: Kristine Garbe M.D.   On: 05/04/2016 19:12   Dg Foot Complete Right  Result Date: 05/04/2016 CLINICAL DATA:  Pain after fall EXAM: RIGHT FOOT COMPLETE - 3+ VIEW COMPARISON:  None. FINDINGS: The bones are osteopenic. The distal phalanx of the great toe is upturned in appearance. There is degenerative joint space narrowing of the second through fifth DIP and PIP joints, interphalangeal joint of the great toe, mtp joints and base of the metatarsals bowel. No evidence of acute fracture. There may be old fifth metatarsal base fracture with linear lucency seen but sclerotic appearing margins. Calcaneal enthesophytes are seen along the plantar and dorsal aspect. There is slight pes cavus. IMPRESSION: Osteopenia without acute osseous abnormality. Calcaneal enthesophytes. Pes cavus. Electronically Signed   By: Ashley Royalty M.D.   On: 05/04/2016 19:27   Dg Hips Bilat W Or Wo Pelvis 2 Views  Result Date: 05/04/2016 CLINICAL DATA:  Pain after fall. EXAM: DG HIP (WITH OR WITHOUT PELVIS) 2V BILAT COMPARISON:  None. FINDINGS: There is spurring of the acetabular components of both native hips. Slight joint space narrowing of both hips. No acute displaced fracture. Left gluteal soft  tissue ossific densities may reflect sequela of gluteal tendinopathy with spurring off the left lesser trochanter at the iliopsoas tendon insertion. There is partial visualization of lumbar fixation hardware with pedicle screws noted at L4. There appears to be degenerative disc disease at L4 and L5 with facet arthropathy. The pubic symphysis is maintained. No acute appearing abnormality of the SI joints. No soft tissue masses. IMPRESSION: No acute osseous abnormality of the bony pelvis and hips. Degenerative spurring and slight joint space narrowing of both hip joints. Electronically Signed   By: Ashley Royalty M.D.   On: 05/04/2016 19:40   Assessment/Plan 1. Tremor -start primidone 250 mg twice daily  2. Uncontrolled type 2 diabetes mellitus with stage 2 chronic kidney disease, with long-term current use of insulin (HCC) improved - Hemoglobin A1c; Future - Comprehensive metabolic panel; Future - Microalbumin, urine; Future  3. Pain, joint, knee, left Chronic tricompartment OA  4. Hypothyroidism, unspecified type compensated - TSH; Future  5. Essential hypertension controlled  6. Edema, unspecified type A little worse. I do not want to increase diuretic due to rise in creatinine and BUN  7. Chronic kidney disease, stage III (moderate) Rising BUN and creatinine  8. Gastroesophageal reflux disease, esophagitis presence not specified stable  9. Dysphagia, unspecified type stable  10. Pruritic disorder improved  11. Closed nondisplaced fracture of medial malleolus of left tibia, initial encounter -continue with ortho appt and splint

## 2016-05-19 ENCOUNTER — Telehealth: Payer: Self-pay

## 2016-05-19 ENCOUNTER — Other Ambulatory Visit: Payer: Self-pay | Admitting: *Deleted

## 2016-05-19 ENCOUNTER — Encounter: Payer: Self-pay | Admitting: *Deleted

## 2016-05-19 DIAGNOSIS — M25572 Pain in left ankle and joints of left foot: Secondary | ICD-10-CM | POA: Diagnosis not present

## 2016-05-19 DIAGNOSIS — Z794 Long term (current) use of insulin: Secondary | ICD-10-CM | POA: Diagnosis not present

## 2016-05-19 DIAGNOSIS — I509 Heart failure, unspecified: Secondary | ICD-10-CM | POA: Diagnosis not present

## 2016-05-19 DIAGNOSIS — I130 Hypertensive heart and chronic kidney disease with heart failure and stage 1 through stage 4 chronic kidney disease, or unspecified chronic kidney disease: Secondary | ICD-10-CM | POA: Diagnosis not present

## 2016-05-19 DIAGNOSIS — Z9181 History of falling: Secondary | ICD-10-CM | POA: Diagnosis not present

## 2016-05-19 DIAGNOSIS — I739 Peripheral vascular disease, unspecified: Secondary | ICD-10-CM | POA: Diagnosis not present

## 2016-05-19 DIAGNOSIS — E1122 Type 2 diabetes mellitus with diabetic chronic kidney disease: Secondary | ICD-10-CM | POA: Diagnosis not present

## 2016-05-19 DIAGNOSIS — N183 Chronic kidney disease, stage 3 (moderate): Secondary | ICD-10-CM | POA: Diagnosis not present

## 2016-05-19 DIAGNOSIS — Z8673 Personal history of transient ischemic attack (TIA), and cerebral infarction without residual deficits: Secondary | ICD-10-CM | POA: Diagnosis not present

## 2016-05-19 NOTE — Patient Outreach (Signed)
[facility]) Care Management  [place] Initial Home Visit 05/19/2016  Karen [name] 01/21/1924 [id number]  Karen [name] is an 82 y.o. female referred to [place] by ED provider for care coordination and community resource needs assessment after patient's recent ED visit on May 04, 2016 for mechanical fall; patient incurred questionable avulsion fracture of (L) medial malleolus.  Patient has history of DM, HTN, PVD, GERD, CKD stage III, HLD, OSA, SOB, and hypothyroidism.  Home health [facility]) services for PT were ordered at time of discharge from ED.  HIPAA/ identity verified, and written consent for [place] services obtained today.   Today, patient states that she "is doing pretty good" after her recent fall/ (L) ankle fracture/ ED visit.     -- continues to wear leg brace, apply ice, using walker/ cane around home, staying off feet "as much as possible," taking pain medication as needed.  Pt. Continues to report that pain in (L) knee is her "biggest issue" secondary to chronic arthritis; states ankle pain is manageable with pain medications.  HH PT in progress, "going well," patient states she had PT session "this morning."     -- Denies new falls since ED visit.  Using walker 100% of the time since ankle fracture occurred, prior to that patient states "I used my cane most of the time."  Fall risk evaluation was performed and no obvious fall hazards identified in patient's home, which clutter-free.  Fall risk/ prevention educational provided with educational materials for patient to review.  Patient wears Life Alert necklace "all the time."  Again, patient states that she believes the primary reason she falls is because her "knee with arthritis just gives out, it is bone-on-bone."  We discussed value of patient considering using her walker 100% of the time, even after her ankle fracture heals, and patient stated she would consider, but added that  she "hopes she can go back to just using the cane" when he fracture heals.    -- Medications:  Patient has all of her medications and takes all as they are prescribed.  Patient states uses weekly pill box, which she manages independently.  Patient has an excellent understanding of the purpose, dosing, and scheduling of all of her medications.  Patient was recently discharged from hospital (ED visit) and all medications were thoroughly reviewed with her today.   -- Upcoming provider appointments reviewed with patient today; patient reports accurate report of appointments and verbalizes plans to attend all scheduled appointments. Next orthopedic appointment is scheduled for May 27, 2016.   -- Although patient previously denied need for community resources, today she reports that she would like to know what transportation options she has available to her, as her son who usually assist with transportation to and from provider appointments works, and sometimes has to take off to transport her to provider appointments.  Although she again reports that her family/ friends/ church members assist her with meals, she is also resource options around meals, and states that she is totally unable to prepare her own meals with her broken ankle and arthritic knee.  We discussed [facility] CSW referral, which I will make.  -- self-health management of chronic disease states of DM, HTN, A-Fib:  Pt. States that she feels DM is her biggest chronic disease issue, and she verbalizes an excellent understanding of self-health management of her chronic disease states.  Patient reports that she monitors and records her daily blood sugars, and  reports a fasting blood sugar this morning of 110; patient is knowledgeable about her A1-C level trends, and easily verbalizes self-health management plan of care for all chronic disease states.  Patient states that she recently got her A1-C level "from 8 back down to 6" by just "using good common  sense and eating in moderation," and this was verified in EMR.  Patient does not monitor her daily weights or BP's at present.  Patient denies further concerns, problems, issues or questions today.  I confirmed that patient has my direct phone number, the main [facility] CM phone number, and the [facility] 24-hour nurse line phone number, and we scheduled next [place] in-home visit for next month.  Subjective: "I'm really pretty healthy for my age, if I can just keep the arthritis pain in my knee under control, and keep my knee from giving out on me, I don't think I would fall as much as I do."  Objective:   BP (!) 116/50   Pulse (!) 55   Resp 18   Wt 133 lb (60.3 kg)   SpO2 94%   BMI 22.13 kg/m     Review of Systems  Constitutional: Positive for malaise/fatigue. Negative for weight loss.  Respiratory: Negative.  Negative for cough, sputum production, shortness of breath and wheezing.        Patient reports occasional shortness "with over-exertion;"  Patient states recovers within 15-30 minutes with rest  Cardiovascular: Positive for leg swelling. Negative for chest pain and palpitations.       Bilateral ankles swollen +1  Gastrointestinal: Positive for constipation. Negative for abdominal pain, heartburn and nausea.       Patient reports chronic issues with constipation secondary to pain medication for arthritis; takes metamucil and stool softeners, "which helps."  Genitourinary: Positive for frequency and urgency.       Reports "overactive bladder," and "takes diuretics"  Musculoskeletal: Positive for falls, joint pain and myalgias.       "Arthritis in knee is my biggest problem."  Patient wearing (L) ankle brace  Neurological: Positive for tremors. Negative for dizziness and weakness.  Psychiatric/Behavioral: Negative.  Negative for depression. The patient is not nervous/anxious.     Physical Exam  Constitutional: She is oriented to person, place, and time. She appears well-developed  and well-nourished. No distress.  Cardiovascular: Normal rate, regular rhythm, normal heart sounds and intact distal pulses.   Pulses:      Radial pulses are 2+ on the right side, and 2+ on the left side.       Dorsalis pedis pulses are 1+ on the right side, and 1+ on the left side.  Respiratory: Effort normal and breath sounds normal. No respiratory distress. She has no wheezes. She has no rales.  GI: Soft. Bowel sounds are normal.  Musculoskeletal: She exhibits edema.  Neurological: She is alert and oriented to person, place, and time.  Skin: Skin is warm and dry. No erythema.  Psychiatric: She has a normal mood and affect. Her behavior is normal. Judgment and thought content normal.    Encounter Medications:   Outpatient Encounter Prescriptions as of 05/19/2016  Medication Sig  . AFLURIA SUSP   . ALPRAZolam (XANAX) 0.5 MG tablet Take one tablet by mouth up to 3 times daily as needed for anxiety or sleep  . aspirin EC 81 MG tablet One daily to help prevent stroke and heart attack  . Blood Glucose Monitoring Suppl (BAYER BREEZE 2 SYSTEM) W/DEVICE KIT   .  chlorpheniramine-HYDROcodone (TUSSIONEX PENNKINETIC ER) 10-8 MG/5ML LQCR 5 cc every 12 hours to control cough  . CRANBERRY PO Take 300 mg by mouth daily.  . [employer] NATURAL LUTEIN EYE HEALTH PO Take by mouth. One tablet once daily for eyes  . desoximetasone (TOPICORT) 0.25 % cream Apply to lesion daily  . digoxin (LANOXIN) 0.125 MG tablet Take one tablet by mouth on Mondays,Wednedsdays,Fridays and Saturdays  . diphenhydrAMINE-zinc acetate (BENADRYL ITCH STOPPING) cream Apply 1 application topically 3 (three) times daily as needed.  . fluocinonide cream (LIDEX) 1.61 % Apply 1 application topically 2 (two) times daily.  . furosemide (LASIX) 40 MG tablet One daily to control edema  . Glucose Blood (BAYER BREEZE 2 TEST VI) Check blood sugar 3 x daily as directed. DX:250.02  . HYDROcodone-acetaminophen (NORCO/VICODIN) 5-325 MG tablet TAKE 1  TABLET BY MOUTH 4 TIMES A DAY AS NEEDED FOR PAIN.  Marland Kitchen insulin glargine (LANTUS) 100 UNIT/ML injection Inject 42 Units into the skin at bedtime.  . insulin lispro (HUMALOG KWIKPEN) 100 UNIT/ML KiwkPen Inject 10units before breakfast, 12 units before lunch, and 14units before supper to control sugar. May use up to 3 more units per meal as needed  . Insulin Pen Needle (B-D ULTRAFINE III SHORT PEN) 31G X 8 MM MISC Use with administering insulin. Dx. E11.29  . Insulin Pen Needle 29G X 12.7MM MISC Use as directed to administer insulin. DX: 250.40  . Insulin Syringe-Needle U-100 (EASY COMFORT INSULIN SYRINGE) 30G X 1/2" 0.5 ML MISC Use as Directed with insulin injections. Dx: 250.00  . Lancet Devices (LANCING DEVICE) MISC   . levothyroxine (SYNTHROID, LEVOTHROID) 125 MCG tablet TAKE 1 TABLET BY MOUTH DAILY BEFORE BREAKFAST  . lidocaine-prilocaine (EMLA) cream Apply nightly to painful areas  . metoprolol (LOPRESSOR) 50 MG tablet One twice daily to control heart rate  . Multiple Vitamins-Minerals (PRESERVISION AREDS 2 PO) Take by mouth. 1 by mouth two times daily  . omeprazole (PRILOSEC) 40 MG capsule Take one capsule by mouth twice daily for stomach  . ondansetron (ZOFRAN) 4 MG tablet TAKE 1 TABLET EVERY 4 HOURS AS NEEDED FOR NAUSEA/VOMITING  . Polyethyl Glycol-Propyl Glycol (SYSTANE OP) Apply to eye. One drop both eyes twice daily for dry eyes  . primidone (MYSOLINE) 250 MG tablet One twice daily to help control tremor  . tiZANidine (ZANAFLEX) 4 MG tablet Take one tablet by mouth three times daily as needed to relax muscles  . trimethoprim (TRIMPEX) 100 MG tablet Take one tablet by mouth every night at bedtime to prevent bladder infection  . urea (CARMOL) 20 % cream Apply daily to the callus on the heels  . Vitamin D, Ergocalciferol, (DRISDOL) 50000 units CAPS capsule TAKE 1 CAPSULE BY MOUTH ONCE A WEEK  . VITAMIN E PO Take 400 Units by mouth daily.   No facility-administered encounter medications on  file as of 05/19/2016.     Functional Status:   In your present state of health, do you have any difficulty performing the following activities: 05/13/2016  Hearing? N  Vision? N  Difficulty concentrating or making decisions? N  Walking or climbing stairs? Y  Dressing or bathing? N  Doing errands, shopping? N  Preparing Food and eating ? N  Using the Toilet? N  In the past six months, have you accidently leaked urine? N  Do you have problems with loss of bowel control? N  Managing your Medications? N  Managing your Finances? Y  Housekeeping or managing your Housekeeping? Y  Some recent  data might be hidden    Fall/Depression Screening:    PHQ 2/9 Scores 05/13/2016 01/27/2016 02/18/2015 01/14/2015 11/12/2014 01/15/2014 02/06/2013  PHQ - 2 Score 0 0 0 0 2 0 4  PHQ- 9 Score - - - - 4 - 9    Assessment:  Ms. Cavalieri is recuperating well after her recent fall/ ankle fracture, but continues to be a high fall risk due to chronic arthritis pain in her knee.  Ms. Bleich is actively participating in [facility] PT services and has an excellent understanding of self-health management of her chronic disease state of DM and daily medication regimen.  Although Ms. Flis reports a strong support system with her care needs through her family and church friends, she is interested in knowing what community resource options are available to her for transportation to provider appointments and with meal provision.  Ms. Nissan is committed to compliance with her overall plan of care with her current health care needs.  Plan:   Patient will attend all scheduled provider appointments and will work with [facility] PT services as ordered.   Patient will continue using her cane/ walker for mobility around her home.  Patient will review EMMI educational material provided to her today, for further discussion at next [place] in-home visit.  Patient will promptly notify her care providers for any concerns, issues, or  problems that arise.   I will place [facility] CSW referral to assess/ evaluate community resource needs for transportation and meal assistance.  I will route today's initial home visit note to patient's PCP.   [place] outreach to continue with scheduled in-home visit next week.  I appreciate the opportunity to participate in Ms. [place] care,   Oneta Rack, RN, BSN, [place] Coordinator [facility] Care Management  [phone]

## 2016-05-19 NOTE — Telephone Encounter (Signed)
Message left on triage voicemail by Izora Gala (Occupational Therapist with [place]) patient has a level 2 medication interaction with Xanax and Tussinex (takes regularly in the fall/winter) also with Xanax and anti-depressant  Please advise

## 2016-05-20 NOTE — Telephone Encounter (Signed)
I think it is OK for her to continue both medications.

## 2016-05-23 ENCOUNTER — Other Ambulatory Visit: Payer: Self-pay | Admitting: *Deleted

## 2016-05-23 ENCOUNTER — Other Ambulatory Visit: Payer: Self-pay | Admitting: Nurse Practitioner

## 2016-05-23 DIAGNOSIS — Z9181 History of falling: Secondary | ICD-10-CM | POA: Diagnosis not present

## 2016-05-23 DIAGNOSIS — E1122 Type 2 diabetes mellitus with diabetic chronic kidney disease: Secondary | ICD-10-CM | POA: Diagnosis not present

## 2016-05-23 DIAGNOSIS — N183 Chronic kidney disease, stage 3 (moderate): Secondary | ICD-10-CM | POA: Diagnosis not present

## 2016-05-23 DIAGNOSIS — I739 Peripheral vascular disease, unspecified: Secondary | ICD-10-CM | POA: Diagnosis not present

## 2016-05-23 DIAGNOSIS — I130 Hypertensive heart and chronic kidney disease with heart failure and stage 1 through stage 4 chronic kidney disease, or unspecified chronic kidney disease: Secondary | ICD-10-CM | POA: Diagnosis not present

## 2016-05-23 DIAGNOSIS — M25572 Pain in left ankle and joints of left foot: Secondary | ICD-10-CM | POA: Diagnosis not present

## 2016-05-23 DIAGNOSIS — Z8673 Personal history of transient ischemic attack (TIA), and cerebral infarction without residual deficits: Secondary | ICD-10-CM | POA: Diagnosis not present

## 2016-05-23 DIAGNOSIS — I509 Heart failure, unspecified: Secondary | ICD-10-CM | POA: Diagnosis not present

## 2016-05-23 DIAGNOSIS — Z794 Long term (current) use of insulin: Secondary | ICD-10-CM | POA: Diagnosis not present

## 2016-05-23 NOTE — Patient Outreach (Signed)
[facility]) Care Management  05/23/2016  Karen [name] [birth year] [id number]   Phone call to patient to assess for community resource needs.  Per patient, she would like resources for transportation and food resources. Per patient, her son (mostly)  and her daughter assist with her transportation needs, however she would like a back up plan. Private pay options for transporation discussed [place].  Per patient, she lives on a fixed income and privately paying for transportation is not an option at this time. Per patient, she is able to drive herself short distances, however will continue to  rely on her family to get to place farther than that, like the doctor's office and grocery store.   Per patient, her son and daughter are very supportive and come out to provide assistance at least once per week.  Her daughter helps with the grocery shopping, and her son helps with housekeeping and meals when she does not feel like cooking.  Per patient, she is able to cook herself small meals.  This Education officer, museum discussed the meals on wheels program through the senior center.  Patient agrees with this referral.   Plan: Referral to be made for meals on wheels           This social worker will follow up within 1 month.

## 2016-05-23 NOTE — Telephone Encounter (Signed)
I called Karen [name] and informed her of Dr.Green's response

## 2016-05-24 ENCOUNTER — Encounter: Payer: Self-pay | Admitting: *Deleted

## 2016-05-24 DIAGNOSIS — M25572 Pain in left ankle and joints of left foot: Secondary | ICD-10-CM | POA: Diagnosis not present

## 2016-05-24 DIAGNOSIS — I509 Heart failure, unspecified: Secondary | ICD-10-CM | POA: Diagnosis not present

## 2016-05-24 DIAGNOSIS — Z8673 Personal history of transient ischemic attack (TIA), and cerebral infarction without residual deficits: Secondary | ICD-10-CM | POA: Diagnosis not present

## 2016-05-24 DIAGNOSIS — Z794 Long term (current) use of insulin: Secondary | ICD-10-CM | POA: Diagnosis not present

## 2016-05-24 DIAGNOSIS — I739 Peripheral vascular disease, unspecified: Secondary | ICD-10-CM | POA: Diagnosis not present

## 2016-05-24 DIAGNOSIS — Z9181 History of falling: Secondary | ICD-10-CM | POA: Diagnosis not present

## 2016-05-24 DIAGNOSIS — E1122 Type 2 diabetes mellitus with diabetic chronic kidney disease: Secondary | ICD-10-CM | POA: Diagnosis not present

## 2016-05-24 DIAGNOSIS — I130 Hypertensive heart and chronic kidney disease with heart failure and stage 1 through stage 4 chronic kidney disease, or unspecified chronic kidney disease: Secondary | ICD-10-CM | POA: Diagnosis not present

## 2016-05-24 DIAGNOSIS — N183 Chronic kidney disease, stage 3 (moderate): Secondary | ICD-10-CM | POA: Diagnosis not present

## 2016-05-25 DIAGNOSIS — I130 Hypertensive heart and chronic kidney disease with heart failure and stage 1 through stage 4 chronic kidney disease, or unspecified chronic kidney disease: Secondary | ICD-10-CM | POA: Diagnosis not present

## 2016-05-25 DIAGNOSIS — I509 Heart failure, unspecified: Secondary | ICD-10-CM | POA: Diagnosis not present

## 2016-05-25 DIAGNOSIS — M25572 Pain in left ankle and joints of left foot: Secondary | ICD-10-CM | POA: Diagnosis not present

## 2016-05-25 DIAGNOSIS — Z8673 Personal history of transient ischemic attack (TIA), and cerebral infarction without residual deficits: Secondary | ICD-10-CM | POA: Diagnosis not present

## 2016-05-25 DIAGNOSIS — Z794 Long term (current) use of insulin: Secondary | ICD-10-CM | POA: Diagnosis not present

## 2016-05-25 DIAGNOSIS — Z9181 History of falling: Secondary | ICD-10-CM | POA: Diagnosis not present

## 2016-05-25 DIAGNOSIS — E1122 Type 2 diabetes mellitus with diabetic chronic kidney disease: Secondary | ICD-10-CM | POA: Diagnosis not present

## 2016-05-25 DIAGNOSIS — N183 Chronic kidney disease, stage 3 (moderate): Secondary | ICD-10-CM | POA: Diagnosis not present

## 2016-05-25 DIAGNOSIS — I739 Peripheral vascular disease, unspecified: Secondary | ICD-10-CM | POA: Diagnosis not present

## 2016-05-26 ENCOUNTER — Telehealth: Payer: Self-pay

## 2016-05-26 DIAGNOSIS — Z8673 Personal history of transient ischemic attack (TIA), and cerebral infarction without residual deficits: Secondary | ICD-10-CM | POA: Diagnosis not present

## 2016-05-26 DIAGNOSIS — M25572 Pain in left ankle and joints of left foot: Secondary | ICD-10-CM | POA: Diagnosis not present

## 2016-05-26 DIAGNOSIS — N183 Chronic kidney disease, stage 3 (moderate): Secondary | ICD-10-CM | POA: Diagnosis not present

## 2016-05-26 DIAGNOSIS — Z9181 History of falling: Secondary | ICD-10-CM | POA: Diagnosis not present

## 2016-05-26 DIAGNOSIS — E1122 Type 2 diabetes mellitus with diabetic chronic kidney disease: Secondary | ICD-10-CM | POA: Diagnosis not present

## 2016-05-26 DIAGNOSIS — Z794 Long term (current) use of insulin: Secondary | ICD-10-CM | POA: Diagnosis not present

## 2016-05-26 DIAGNOSIS — I739 Peripheral vascular disease, unspecified: Secondary | ICD-10-CM | POA: Diagnosis not present

## 2016-05-26 DIAGNOSIS — I130 Hypertensive heart and chronic kidney disease with heart failure and stage 1 through stage 4 chronic kidney disease, or unspecified chronic kidney disease: Secondary | ICD-10-CM | POA: Diagnosis not present

## 2016-05-26 DIAGNOSIS — I509 Heart failure, unspecified: Secondary | ICD-10-CM | POA: Diagnosis not present

## 2016-05-26 NOTE — Telephone Encounter (Signed)
Karen [name] with [facility] called to inform Dr. Nyoka Cowden that patient has chosen not to take the Primidone 250 mg. Patient stated that this medication caused her to be nauseous. Patient also stated that she did not want to take any other medications for tremors.   Marzetta Board also informed that patient has been discharged from home health physical therapy. She states that patient is currently at baseline.

## 2016-05-27 DIAGNOSIS — S8252XD Displaced fracture of medial malleolus of left tibia, subsequent encounter for closed fracture with routine healing: Secondary | ICD-10-CM | POA: Diagnosis not present

## 2016-05-28 ENCOUNTER — Other Ambulatory Visit: Payer: Self-pay | Admitting: Internal Medicine

## 2016-05-30 ENCOUNTER — Other Ambulatory Visit: Payer: Self-pay | Admitting: *Deleted

## 2016-05-30 ENCOUNTER — Telehealth: Payer: Self-pay

## 2016-05-30 MED ORDER — HYDROCODONE-ACETAMINOPHEN 5-325 MG PO TABS: ORAL_TABLET | ORAL | 0 refills | Status: DC

## 2016-05-30 NOTE — Telephone Encounter (Signed)
Patient requested and Karen [name] will pick up

## 2016-05-30 NOTE — Telephone Encounter (Signed)
I called patient to let her know that she has a prescription for hydrocodone/APAP 5-325 mg ready to pick up at the office. The prescription was placed in the filing cabinet at the front desk.

## 2016-05-31 ENCOUNTER — Other Ambulatory Visit: Payer: Self-pay | Admitting: Nurse Practitioner

## 2016-05-31 ENCOUNTER — Other Ambulatory Visit: Payer: Self-pay | Admitting: Internal Medicine

## 2016-05-31 DIAGNOSIS — E039 Hypothyroidism, unspecified: Secondary | ICD-10-CM

## 2016-06-01 DIAGNOSIS — M545 Low back pain: Secondary | ICD-10-CM | POA: Diagnosis not present

## 2016-06-01 DIAGNOSIS — M2352 Chronic instability of knee, left knee: Secondary | ICD-10-CM | POA: Diagnosis not present

## 2016-06-02 ENCOUNTER — Other Ambulatory Visit: Payer: Self-pay | Admitting: *Deleted

## 2016-06-02 ENCOUNTER — Encounter: Payer: Self-pay | Admitting: *Deleted

## 2016-06-02 NOTE — Patient Outreach (Signed)
[facility]) Care Management  06/02/2016  Karen [name] Jun 21, 1924 [id number]   Phone call to patient to follow up on Meals on Wheels referral.  Per patient, she has been assessed for meals on wheels, however because she drives short distances she does not qualify at this time.  Per patient her son and daughter will continue to assist her as well as her neighbor.  Patient is considering privately paying for the meals and will get her son to look over the paperwork and submit it.  Patient declined referral for the congregate meal program at the [facility] due to the distance. She only feels comfortable driving short distances and [employer] [place] not travel outside of the county.  Patient verbalized having no additional community resource needs.  Patient's case to be closed to social work at this time.    Sheralyn Boatman [facility] Care Management [phone]

## 2016-06-10 ENCOUNTER — Other Ambulatory Visit: Payer: Self-pay | Admitting: *Deleted

## 2016-06-10 ENCOUNTER — Encounter: Payer: Self-pay | Admitting: *Deleted

## 2016-06-10 NOTE — Patient Outreach (Signed)
[facility]) Care Management [place] Telephone Outreach 06/10/2016  Karen [name] February 28, 1924 [id number]  Successful telephone outreach to Karen [name], 82 y.o. female referred to [place] by ED provider for care coordination and community resource needs assessment after patient's recent ED visit on May 04, 2016 for mechanical fall; patient incurred questionable avulsion fracture of (L) medial malleolus. Patient has history of DM, HTN, PVD, GERD, CKD stage III, HLD, OSA, SOB, and hypothyroidism. Home health [facility]) services for PT were ordered at time of discharge from ED. HIPAA/ identity verified with patient today over phone.  Call was placed today per patient request to call and remind her of upcoming scheduled [place] in-home visit next week.  Today, patient states that she "is doing great" after her recent fall/ (L) ankle fracture/ ED visit:   -- [facility] has discharged her and she is doing well, "not having problems or pain." -- no new falls -- orthopedic provider has "released" her without any further follow up scheduled. -- not having to wear ankle brace anymore. -- taking medications as prescribed. -- acknowledges that she has spoken with [facility] CSW, denies further CSW/ community resource needs  Plan:  Patient will attend all scheduled provider appointments and will take her medications as they are prescribed.  Patient will continue using her cane/ walker for mobility around her home as needed.  Patient will promptly notify her care providers for any concerns, issues, or problems that arise.   [place] outreach to continue with scheduled in-home visitnext week.   Oneta Rack, RN, BSN, [facility] Care Management  [phone]

## 2016-06-16 ENCOUNTER — Encounter: Payer: Self-pay | Admitting: *Deleted

## 2016-06-16 ENCOUNTER — Other Ambulatory Visit: Payer: Self-pay | Admitting: *Deleted

## 2016-06-16 NOTE — Patient Outreach (Signed)
[facility]) Care Management  Eleanor Routine Home Visit/ [facility] Discharge 06/16/2016  TASHAWNDA BLEILER 1924-05-22 [id number]  BEVERLYN MCGINNESS is an 82 y.o. female referred to [place] by ED provider for care coordination and community resource needs assessment after patient's recent ED visit on May 04, 2016 for mechanical fall; patient incurred questionable avulsion fracture of (L) medial malleolus. Patient has history of DM, HTN, PVD, GERD, CKD stage III, HLD, OSA, SOB, and hypothyroidism. Home health [facility]) services for PT were ordered at time of discharge from ED.   Today, patient states that she "is doing great" after her recent fall/ (L) ankle fracture/ ED visit.   -- no longer wearing leg brace, states orthopedic provider has "released" her, and states that she is back to her normal activities.  Continues to report that pain in (L) knee is her "biggest issue" secondary to chronic arthritis; states ankle pain is manageable with pain medications. HH PT no longer active.     -- Denies new falls since ED visit.  Using walker/ cane 100% of the time since ankle fracture occurred.  Patient verbalizes appropriate knowledge regarding fall risks/ prevention measures.  -- Medications: Patient has all of her medications and takes all as they are prescribed. Patient states uses weekly pill box, which she manages independently.  Patient has an excellent understanding of the purpose, dosing, and scheduling of all of her medications.   -- Upcoming provider appointments reviewed with patient today; patient reports accurate report of appointments and verbalizes plans to attend all scheduled appointments. Recently attended PCP office visit May 17, 2016.  -- Community resource needs:  Confirms that she spoke with [facility] CSW; denies further needs today.  States that she has resumed driving herself to breakfast with her friends every morning, and to run  short errands.   -- self-health management of chronic disease states of DM, HTN, A-Fib:  Pt. continues to verbalizes an excellent understanding of self-health management of her chronic disease states.   Patient denies further concerns, problems, issues or questions today. Patient states that she believes she is doing well enough that she is ready for [place] discharge, which I agree with, as she has met her previously established [facility] CM goals.  Subjective: "I'm finally able to do the things I used to do, and I think I am doing really great."  Objective:    BP (!) 142/54   Pulse (!) 55   Resp 18   SpO2 96%    Review of Systems  Constitutional: Negative.   Respiratory: Negative for sputum production, shortness of breath and wheezing.        Patient reports SOB with activity of cooking and straightening up home; states this is normal for her.  Patient is at baseline today from previous [facility] CM in- home visit.  No SOB noted today on RA.  Cardiovascular: Negative.  Negative for chest pain, palpitations and leg swelling.  Gastrointestinal: Negative.  Negative for abdominal pain, nausea and vomiting.  Genitourinary: Positive for frequency and urgency.       Patient attributes to diuretic therapy  Musculoskeletal: Positive for joint pain. Negative for falls.       Chronic knee pain from arthritis; patient reports pain is manageable with medications  Neurological: Negative.   Psychiatric/Behavioral: Negative for depression. The patient is not nervous/anxious.     Physical Exam  Constitutional: She is oriented to person, place, and time. She appears well-developed and well-nourished.  No distress.  Cardiovascular: Normal rate, regular rhythm, normal heart sounds and intact distal pulses.   Respiratory: Effort normal and breath sounds normal. No respiratory distress. She has no wheezes. She has no rales.  GI: Soft. Bowel sounds are normal.  Musculoskeletal: She exhibits no edema.   Neurological: She is alert and oriented to person, place, and time.  Skin: Skin is warm and dry. No erythema.  Psychiatric: She has a normal mood and affect. Her behavior is normal. Judgment and thought content normal.    Encounter Medications:   Outpatient Encounter Prescriptions as of 06/16/2016  Medication Sig Note  . ALPRAZolam (XANAX) 0.5 MG tablet Take one tablet by mouth up to 3 times daily as needed for anxiety or sleep   . aspirin EC 81 MG tablet One daily to help prevent stroke and heart attack   . CRANBERRY PO Take 300 mg by mouth daily.   . [employer] NATURAL LUTEIN EYE HEALTH PO Take by mouth. One tablet once daily for eyes   . digoxin (LANOXIN) 0.125 MG tablet TAKE 1 TABLET BY MOUTH ON MONDAY, WEDNESDAY, FRIDAY AND SATURDAY   . diphenhydrAMINE-zinc acetate (BENADRYL ITCH STOPPING) cream Apply 1 application topically 3 (three) times daily as needed. 05/19/2016: Uses as needed  . furosemide (LASIX) 40 MG tablet One daily to control edema   . HYDROcodone-acetaminophen (NORCO/VICODIN) 5-325 MG tablet TAKE 1 TABLET BY MOUTH 4 TIMES A DAY AS NEEDED FOR PAIN.   Marland Kitchen insulin glargine (LANTUS) 100 UNIT/ML injection Inject 42 Units into the skin at bedtime.   . insulin lispro (HUMALOG KWIKPEN) 100 UNIT/ML KiwkPen Inject 10units before breakfast, 12 units before lunch, and 14units before supper to control sugar. May use up to 3 more units per meal as needed   . Insulin Pen Needle (B-D ULTRAFINE III SHORT PEN) 31G X 8 MM MISC Use with administering insulin. Dx. E11.29   . Insulin Pen Needle 29G X 12.7MM MISC Use as directed to administer insulin. DX: 250.40   . Insulin Syringe-Needle U-100 (EASY COMFORT INSULIN SYRINGE) 30G X 1/2" 0.5 ML MISC Use as Directed with insulin injections. Dx: 250.00   . levothyroxine (SYNTHROID, LEVOTHROID) 125 MCG tablet TAKE 1 TABLET BY MOUTH DAILY BEFORE BREAKFAST.   . metoprolol (LOPRESSOR) 50 MG tablet One twice daily to control heart rate   . Multiple  Vitamins-Minerals (PRESERVISION AREDS 2 PO) Take by mouth. 1 by mouth two times daily   . omeprazole (PRILOSEC) 40 MG capsule Take one capsule by mouth twice daily for stomach   . omeprazole (PRILOSEC) 40 MG capsule TAKE ONE (1) CAPSULE BY MOUTH 2 TIMES DAILY FOR STOMACH   . ondansetron (ZOFRAN) 4 MG tablet TAKE 1 TABLET EVERY 4 HOURS AS NEEDED FOR NAUSEA/VOMITING   . Polyethyl Glycol-Propyl Glycol (SYSTANE OP) Apply to eye. One drop both eyes twice daily for dry eyes   . tiZANidine (ZANAFLEX) 4 MG tablet Take one tablet by mouth three times daily as needed to relax muscles   . trimethoprim (TRIMPEX) 100 MG tablet TAKE ONE TABLET BY MOUTH EVERY NIGHT AT BEDTIME TO PREVENT BLADDER INFECTION   . urea (CARMOL) 20 % cream Apply daily to the callus on the heels   . Vitamin D, Ergocalciferol, (DRISDOL) 50000 units CAPS capsule TAKE 1 CAPSULE BY MOUTH ONCE A WEEK   . VITAMIN E PO Take 400 Units by mouth daily.   Marland Kitchen AFLURIA SUSP    . Blood Glucose Monitoring Suppl (BAYER BREEZE 2 SYSTEM) W/DEVICE KIT    .  chlorpheniramine-HYDROcodone (TUSSIONEX PENNKINETIC ER) 10-8 MG/5ML LQCR 5 cc every 12 hours to control cough (Patient not taking: Reported on 06/16/2016) 06/16/2016: Has not needed recently   . desoximetasone (TOPICORT) 0.25 % cream Apply to lesion daily (Patient not taking: Reported on 06/16/2016) 06/16/2016: Did not help; not taking   . fluocinonide cream (LIDEX) 6.81 % Apply 1 application topically 2 (two) times daily. 06/16/2016: Has not needed recently  . Glucose Blood (BAYER BREEZE 2 TEST VI) Check blood sugar 3 x daily as directed. DX:250.02   . Lancet Devices (LANCING DEVICE) MISC    . lidocaine-prilocaine (EMLA) cream Apply nightly to painful areas (Patient not taking: Reported on 06/16/2016) 05/19/2016: Not currently using per patient report  . primidone (MYSOLINE) 250 MG tablet One twice daily to help control tremor (Patient not taking: Reported on 06/16/2016) 06/16/2016: Not taking-- had  adverse effects to medication; PCP aware per pt. report    No facility-administered encounter medications on file as of 06/16/2016.     Functional Status:   In your present state of health, do you have any difficulty performing the following activities: 05/13/2016  Hearing? N  Vision? N  Difficulty concentrating or making decisions? N  Walking or climbing stairs? Y  Dressing or bathing? N  Doing errands, shopping? N  Preparing Food and eating ? N  Using the Toilet? N  In the past six months, have you accidently leaked urine? N  Do you have problems with loss of bowel control? N  Managing your Medications? N  Managing your Finances? Y  Housekeeping or managing your Housekeeping? Y  Some recent data might be hidden    Fall/Depression Screening:    PHQ 2/9 Scores 05/13/2016 01/27/2016 02/18/2015 01/14/2015 11/12/2014 01/15/2014 02/06/2013  PHQ - 2 Score 0 0 0 0 2 0 4  PHQ- 9 Score - - - - 4 - 9    Assessment:  Ms. Wasser has recuperated well after her recent fall/ ankle fracture.  Due to her chronic knee arthritis, she continues to be a high fall risk, but verbalizes appropriate knowledgeable around fall risks/ prevention measures.  Ms. Nygard has an excellent understanding of self-health management of her chronic disease state of DM and daily medication regimen.  Ms. Auble continues to report a strong support system with her care needs through her family and church friends, and denies community resource needs today.  Ms. Ishler is committed to compliance with her overall plan of care with her current health care needs, voices no further care coordination needs, and verbalizes that she feels ready for [place] discharge today.  Plan:   Will discharge patient from [place] program, as she has met her previously established goals, and will make patient's PCP aware of same.  Oneta Rack, RN, BSN, [employer] [facility]   [phone]

## 2016-06-20 ENCOUNTER — Other Ambulatory Visit: Payer: Self-pay | Admitting: Nurse Practitioner

## 2016-06-21 ENCOUNTER — Other Ambulatory Visit: Payer: Self-pay | Admitting: *Deleted

## 2016-06-21 DIAGNOSIS — F419 Anxiety disorder, unspecified: Secondary | ICD-10-CM

## 2016-06-21 MED ORDER — ALPRAZOLAM 0.5 MG PO TABS
ORAL_TABLET | ORAL | 1 refills | Status: DC
Start: 2016-06-21 — End: 2016-08-16

## 2016-06-21 MED ORDER — ALPRAZOLAM 0.5 MG PO TABS: ORAL_TABLET | ORAL | 1 refills | Status: DC

## 2016-06-29 ENCOUNTER — Other Ambulatory Visit: Payer: Self-pay | Admitting: *Deleted

## 2016-06-29 MED ORDER — HYDROCODONE-ACETAMINOPHEN 5-325 MG PO TABS
ORAL_TABLET | ORAL | 0 refills | Status: DC
Start: 2016-06-29 — End: 2016-08-02

## 2016-06-29 NOTE — Telephone Encounter (Signed)
Patient called and requested her Hydrocodone Rx. She also requested [place] to call [employer] and refill her Lantus so she will have enough to get her through the end of the year. I called [employer] and spoke with Ruby and she is faxing [place] a form to fill out for refill. She also stated that patient needed to call sooner this year to reapply for assistance so she doesn't spend $1900 out of pocket like last year. She only has to spend $481.80 to get assistance. Patient aware.

## 2016-06-29 NOTE — Telephone Encounter (Signed)
Received Patient Assistance Program Refill Request Form from [place] [phone]. Filled out and faxed back to Fax: [phone]

## 2016-07-05 DIAGNOSIS — E1165 Type 2 diabetes mellitus with hyperglycemia: Secondary | ICD-10-CM | POA: Diagnosis not present

## 2016-07-07 ENCOUNTER — Telehealth: Payer: Self-pay

## 2016-07-07 NOTE — Telephone Encounter (Signed)
I called patient to let her know that a delivery of Lantus Solostar insulin pens came today. The pens were placed in the refrigerator in the medication room. The bag was labeled with patient's name.   Patient stated that she would have them picked up.

## 2016-07-18 ENCOUNTER — Other Ambulatory Visit: Payer: Medicare Other

## 2016-07-18 DIAGNOSIS — Z794 Long term (current) use of insulin: Principal | ICD-10-CM

## 2016-07-18 DIAGNOSIS — N182 Chronic kidney disease, stage 2 (mild): Secondary | ICD-10-CM | POA: Diagnosis not present

## 2016-07-18 DIAGNOSIS — E1122 Type 2 diabetes mellitus with diabetic chronic kidney disease: Secondary | ICD-10-CM | POA: Diagnosis not present

## 2016-07-18 DIAGNOSIS — E039 Hypothyroidism, unspecified: Secondary | ICD-10-CM | POA: Diagnosis not present

## 2016-07-18 DIAGNOSIS — E1165 Type 2 diabetes mellitus with hyperglycemia: Secondary | ICD-10-CM | POA: Diagnosis not present

## 2016-07-18 DIAGNOSIS — IMO0002 Reserved for concepts with insufficient information to code with codable children: Secondary | ICD-10-CM

## 2016-07-18 LAB — COMPREHENSIVE METABOLIC PANEL
ALK PHOS: 97 U/L (ref 33–130)
ALT: 21 U/L (ref 6–29)
AST: 25 U/L (ref 10–35)
Albumin: 4.1 g/dL (ref 3.6–5.1)
BILIRUBIN TOTAL: 0.5 mg/dL (ref 0.2–1.2)
BUN: 27 mg/dL — AB (ref 7–25)
CALCIUM: 8.8 mg/dL (ref 8.6–10.4)
CO2: 31 mmol/L (ref 20–31)
CREATININE: 1.42 mg/dL — AB (ref 0.60–0.88)
Chloride: 104 mmol/L (ref 98–110)
GLUCOSE: 100 mg/dL — AB (ref 65–99)
Potassium: 4.6 mmol/L (ref 3.5–5.3)
SODIUM: 141 mmol/L (ref 135–146)
Total Protein: 6.5 g/dL (ref 6.1–8.1)

## 2016-07-19 LAB — TSH: TSH: 1.46 mIU/L

## 2016-07-19 LAB — MICROALBUMIN, URINE: Microalb, Ur: 3.4 mg/dL

## 2016-07-19 LAB — HEMOGLOBIN A1C
HEMOGLOBIN A1C: 5.8 % — AB (ref <5.7)
MEAN PLASMA GLUCOSE: 120 mg/dL

## 2016-07-20 ENCOUNTER — Encounter: Payer: Self-pay | Admitting: Internal Medicine

## 2016-07-20 ENCOUNTER — Ambulatory Visit (INDEPENDENT_AMBULATORY_CARE_PROVIDER_SITE_OTHER): Payer: Medicare Other

## 2016-07-20 ENCOUNTER — Ambulatory Visit (INDEPENDENT_AMBULATORY_CARE_PROVIDER_SITE_OTHER): Payer: Medicare Other | Admitting: Internal Medicine

## 2016-07-20 VITALS — BP 148/54 | HR 86 | Temp 97.6°F | Ht 65.0 in | Wt 135.0 lb

## 2016-07-20 VITALS — BP 148/54 | HR 86 | Temp 97.6°F | Ht 65.0 in | Wt 135.6 lb

## 2016-07-20 DIAGNOSIS — IMO0002 Reserved for concepts with insufficient information to code with codable children: Secondary | ICD-10-CM

## 2016-07-20 DIAGNOSIS — E1122 Type 2 diabetes mellitus with diabetic chronic kidney disease: Secondary | ICD-10-CM | POA: Diagnosis not present

## 2016-07-20 DIAGNOSIS — E039 Hypothyroidism, unspecified: Secondary | ICD-10-CM

## 2016-07-20 DIAGNOSIS — N182 Chronic kidney disease, stage 2 (mild): Secondary | ICD-10-CM

## 2016-07-20 DIAGNOSIS — Z0000 Encounter for general adult medical examination without abnormal findings: Secondary | ICD-10-CM

## 2016-07-20 DIAGNOSIS — M25579 Pain in unspecified ankle and joints of unspecified foot: Secondary | ICD-10-CM | POA: Insufficient documentation

## 2016-07-20 DIAGNOSIS — R609 Edema, unspecified: Secondary | ICD-10-CM

## 2016-07-20 DIAGNOSIS — Z794 Long term (current) use of insulin: Secondary | ICD-10-CM

## 2016-07-20 DIAGNOSIS — N183 Chronic kidney disease, stage 3 unspecified: Secondary | ICD-10-CM

## 2016-07-20 DIAGNOSIS — I10 Essential (primary) hypertension: Secondary | ICD-10-CM

## 2016-07-20 DIAGNOSIS — E1165 Type 2 diabetes mellitus with hyperglycemia: Principal | ICD-10-CM

## 2016-07-20 DIAGNOSIS — R251 Tremor, unspecified: Secondary | ICD-10-CM

## 2016-07-20 DIAGNOSIS — E785 Hyperlipidemia, unspecified: Secondary | ICD-10-CM | POA: Diagnosis not present

## 2016-07-20 MED ORDER — GABAPENTIN 100 MG PO CAPS: 100.0000 mg | ORAL_CAPSULE | Freq: Three times a day (TID) | ORAL | 3 refills | Status: DC

## 2016-07-20 MED ORDER — GABAPENTIN 100 MG PO CAPS: ORAL_CAPSULE | ORAL | 3 refills | Status: DC

## 2016-07-20 NOTE — Progress Notes (Signed)
Quick Notes   Health Maintenance:   Pt due for eye exam and foot exam.    Abnormal Screen: None; MMSE-28/30 Failed clock test    Patient Concerns:  Pt recently fell during night; bruised her right arm; c/o pain in both of her feet, moreso at night time.     Nurse Concerns:   None  I have reviewed the information entered by the Health Advisor. I was present in the office during the time of patient interaction and was available for consultation. I agree with the documentation and advice.  Viviann Spare Nyoka Cowden, MD

## 2016-07-20 NOTE — Progress Notes (Signed)
Facility  [place]    Place of Service:   OFFICE    Allergies  Allergen Reactions  . Celebrex [Celecoxib] Rash  . Doxycycline Rash  . Lyrica [Pregabalin] Rash  . Avandia [Rosiglitazone]   . Macrodantin [Nitrofurantoin Macrocrystal]   . Sulfa Antibiotics   . Vioxx [Rofecoxib]   . Silicone Rash    Gets rash from the touch it.    No chief complaint on file.   HPI:   Uncontrolled type 2 diabetes mellitus with stage 2 chronic kidney disease, with long-term current use of insulin (HCC) - having hypoglycemic episodes in the morning when fasting before breakfast  Pain in joint involving ankle and foot, unspecified laterality - sprained right ankle in a fall at home when her walker slid away from her when getting up from toilet in the bathroom. Doing better, but still swollen.  Essential hypertension - mild elevation of the SBP/  Hypothyroidism, unspecified type - compensated  Tremor -- could not tolerate the primidone. She stopped after the 2nd dose due to nausea, dizziness, rash.  Chronic kidney disease, stage III (moderate) - stable  Edema, unspecified type - chronic and unchanged    Medications: Patient's Medications  New Prescriptions   No medications on file  Previous Medications   ALPRAZOLAM (XANAX) 0.5 MG TABLET    Take one tablet by mouth up to 3 times daily as needed for anxiety or sleep   ASPIRIN EC 81 MG TABLET    One daily to help prevent stroke and heart attack   BLOOD GLUCOSE MONITORING SUPPL (BAYER BREEZE 2 SYSTEM) W/DEVICE KIT       CHLORPHENIRAMINE-HYDROCODONE (TUSSIONEX PENNKINETIC ER) 10-8 MG/5ML LQCR    5 cc every 12 hours to control cough   CRANBERRY PO    Take 300 mg by mouth daily.   [facility] NATURAL LUTEIN EYE HEALTH PO    Take by mouth. One tablet once daily for eyes   DESOXIMETASONE (TOPICORT) 0.25 % CREAM    Apply to lesion daily   DIGOXIN (LANOXIN) 0.125 MG TABLET    TAKE 1 TABLET BY MOUTH ON MONDAY, WEDNESDAY, FRIDAY AND SATURDAY   DIPHENHYDRAMINE-ZINC ACETATE (BENADRYL ITCH STOPPING) CREAM    Apply 1 application topically 3 (three) times daily as needed.   FUROSEMIDE (LASIX) 40 MG TABLET    One daily to control edema   GABAPENTIN (NEURONTIN) 100 MG CAPSULE    Take one capsule at bedtime to help tremor. Increase by 1 capsule weekly until you are taking 3 capsules at bedtime.   GLUCOSE BLOOD (BAYER BREEZE 2 TEST VI)    Check blood sugar 3 x daily as directed. DX:250.02   HYDROCODONE-ACETAMINOPHEN (NORCO/VICODIN) 5-325 MG TABLET    TAKE 1 TABLET BY MOUTH 4 TIMES A DAY AS NEEDED FOR PAIN.   INSULIN GLARGINE (LANTUS) 100 UNIT/ML INJECTION    Inject 35 Units into the skin at bedtime.   INSULIN LISPRO (HUMALOG KWIKPEN) 100 UNIT/ML KIWKPEN    Inject 10units before breakfast, 12 units before lunch, and 14units before supper to control sugar. May use up to 3 more units per meal as needed   INSULIN PEN NEEDLE (B-D ULTRAFINE III SHORT PEN) 31G X 8 MM MISC    Use with administering insulin. Dx. E11.29   INSULIN PEN NEEDLE 29G X 12.7MM MISC    Use as directed to administer insulin. DX: 250.40   INSULIN SYRINGE-NEEDLE U-100 (EASY COMFORT INSULIN SYRINGE) 30G X 1/2" 0.5 ML MISC    Use as  Directed with insulin injections. Dx: 250.00   LANCET DEVICES (LANCING DEVICE) MISC       LEVOTHYROXINE (SYNTHROID, LEVOTHROID) 125 MCG TABLET    TAKE 1 TABLET BY MOUTH DAILY BEFORE BREAKFAST.   LIDOCAINE-PRILOCAINE (EMLA) CREAM    Apply nightly to painful areas   METOPROLOL (LOPRESSOR) 50 MG TABLET    One twice daily to control heart rate   MULTIPLE VITAMINS-MINERALS (PRESERVISION AREDS 2 PO)    Take by mouth. 1 by mouth two times daily   OMEPRAZOLE (PRILOSEC) 40 MG CAPSULE    TAKE ONE (1) CAPSULE BY MOUTH 2 TIMES DAILY FOR STOMACH   ONDANSETRON (ZOFRAN) 4 MG TABLET    TAKE 1 TABLET EVERY 4 HOURS AS NEEDED FOR NAUSEA/VOMITING   POLYETHYL GLYCOL-PROPYL GLYCOL (SYSTANE OP)    Apply to eye. One drop both eyes twice daily for dry eyes   TIZANIDINE (ZANAFLEX) 4  MG TABLET    Take one tablet by mouth three times daily as needed to relax muscles   TRIMETHOPRIM (TRIMPEX) 100 MG TABLET    TAKE ONE TABLET BY MOUTH EVERY NIGHT AT BEDTIME TO PREVENT BLADDER INFECTION   UREA (CARMOL) 20 % CREAM    Apply daily to the callus on the heels   VITAMIN D, ERGOCALCIFEROL, (DRISDOL) 50000 UNITS CAPS CAPSULE    TAKE 1 CAPSULE BY MOUTH ONCE A WEEK   VITAMIN E PO    Take 400 Units by mouth daily.  Modified Medications   No medications on file  Discontinued Medications   No medications on file    Review of Systems  Constitutional: Positive for chills and fatigue. Negative for activity change, appetite change, fever and unexpected weight change.  Eyes: Negative.   Respiratory: Negative for shortness of breath and wheezing.   Cardiovascular: Positive for leg swelling. Negative for chest pain and palpitations.       Bilateral mild peripheral arterial disease. She has been evaluated by Dr. Ruta Hinds, vascular surgery, on 06/21/12. He felt that she had bilateral mild peripheral arterial disease. Leg discomfort seems to be more related to her chronic back problems. She has mild external iliac artery stenosis, but does not have severe occlusive disease. No surgical intervention is warranted.  Gastrointestinal: Positive for nausea.       Spastic colon. Intermittent diarrhea. Has more problems with chronic constipation. Mild dysphagia.  Endocrine: Negative for polydipsia, polyphagia and polyuria.       Diabetic. History of thyroid problems. Thyroid has been removed. She is on supplements.  Genitourinary:       Urinary leakage and incontinence. Nocturia. Denies dysuria.  Musculoskeletal: Positive for arthralgias, back pain, gait problem, joint swelling and myalgias.       Complaints of muscular weakness. Chronic backache. Arthritis in other joints. Unsteady gait. Bilateral lower leg discomfort. Bilateral knee pains Enlarged end of the right clavicle at the  manubrium. Pain in the right shoulder . Difficulty raising her arm. History of degenerative changes on Xray Dec. 2015. Left knee "gave out" and caused her to fall 05/04/16. Was seen at ED. tricompartment OA on xrays.  Skin:       Seborrheic keratosis of the left breast. Generalized itching without rash. Ingrown right great toenail.  Neurological: Positive for tremors and headaches.       Has difficulty with balance. There are episodes of dizziness. She experiences numbness in her feet. She walks wobbly and unsteady. His numbness and weakness in the left leg.  Hematological: Negative.   Psychiatric/Behavioral:  Chronic anxiety. Sleeps okay.    Vitals:   07/20/16 1332  BP: (!) 148/54  Pulse: 86  Resp: 16  Temp: 97.6 F (36.4 C)  SpO2: 96%  Weight: 135 lb (61.2 kg)   Body mass index is 22.47 kg/m. Wt Readings from Last 3 Encounters:  07/20/16 135 lb (61.2 kg)  07/20/16 135 lb 9.6 oz (61.5 kg)  05/19/16 133 lb (60.3 kg)      Physical Exam  Constitutional: She is oriented to person, place, and time.  Frail, thin, elderly female.  HENT:  Head: Normocephalic and atraumatic.  Mouth/Throat: No oropharyngeal exudate.  Eyes: Conjunctivae and EOM are normal. Pupils are equal, round, and reactive to light.  Neck: No JVD present. No tracheal deviation present.  Nuchal rigidity. History thyroidectomy.  Cardiovascular: Normal rate and regular rhythm.  Exam reveals no gallop and no friction rub.   No murmur heard. Rate 60 Diminished pedal pulses  Pulmonary/Chest: No respiratory distress. She has no wheezes. She has no rales.  Abdominal: Soft. Bowel sounds are normal. She exhibits no distension and no mass. There is no tenderness.  Musculoskeletal: She exhibits edema (2+ bipedal).  Stiff neck. Right shoulder discomfort on palpation. Unstable gait. Tender processes of the lumbar area. Bilateral lower paraspinal muscular tenderness.  Bulbous, tender knees with increased stiffness  and crepitus. Left knee worse than the right.  Lymphadenopathy:    She has no cervical adenopathy.  Neurological: She is alert and oriented to person, place, and time. No cranial nerve deficit. Coordination abnormal.  Difficulty with balance and with walking.  Skin: No rash noted. No erythema. No pallor.  Seborrheic keratosis left breast. Mildly ingrown right great toenail.  Psychiatric: Her behavior is normal. Thought content normal.  Moderate anxiety.    Labs reviewed: Lab Summary Latest Ref Rng & Units 07/18/2016 05/11/2016 05/04/2016  Hemoglobin 12.0 - 15.0 g/dL (None) (None) 33.1  Hematocrit 36.0 - 46.0 % (None) (None) 37.0  White count - (None) (None) (None)  Platelet count - (None) (None) (None)  Sodium 135 - 146 mmol/L 141 139 142  Potassium 3.5 - 5.3 mmol/L 4.6 4.6 5.1  Calcium 8.6 - 10.4 mg/dL 8.8 8.9 (None)  Phosphorus - (None) (None) (None)  Creatinine 0.60 - 0.88 mg/dL 7.42(L) 3.21(F) 9.43(S)  AST 10 - 35 U/L 25 33 (None)  Alk Phos 33 - 130 U/L 97 101 (None)  Bilirubin 0.2 - 1.2 mg/dL 0.5 0.5 (None)  Glucose 65 - 99 mg/dL 480(S) 531(W) 330(U)  Cholesterol 125 - 200 mg/dL (None) 357 (None)  HDL cholesterol >=46 mg/dL (None) 75(Y) (None)  Triglycerides <150 mg/dL (None) 453 (None)  LDL Direct - (None) (None) (None)  LDL Calc <130 mg/dL (None) 92 (None)  Total protein 6.1 - 8.1 g/dL 6.5 6.5 (None)  Albumin 3.6 - 5.1 g/dL 4.1 3.9 (None)  Some recent data might be hidden   Lab Results  Component Value Date   TSH 1.46 07/18/2016   TSH 1.47 05/11/2016   TSH 5.770 (H) 01/18/2016   Lab Results  Component Value Date   BUN 27 (H) 07/18/2016   BUN 30 (H) 05/11/2016   BUN 32 (H) 05/04/2016   Lab Results  Component Value Date   HGBA1C 5.8 (H) 07/18/2016   HGBA1C 6.2 (H) 05/11/2016   HGBA1C 8.0 (H) 01/18/2016    Assessment/Plan 1. Uncontrolled type 2 diabetes mellitus with stage 2 chronic kidney disease, with long-term current use of insulin (HCC) Reduce Lantus  to 35 units  2. Pain  in joint involving ankle and foot, unspecified laterality Continue current pain meds. Gabapentin that is being started for her tremor may help the foot pain as well  3. Essential hypertension Continue current meds  4. Hypothyroidism, unspecified type compensated  5. Tremor Start gabapentin '100mg'$  has. Increase by 100 mg each week until taking 300 mg hs.  6. Chronic kidney disease, stage III (moderate) stable  7. Edema, unspecified type unchanged

## 2016-07-20 NOTE — Patient Instructions (Signed)
If you become too drowsy, try to stop the muscle relaxer.

## 2016-07-20 NOTE — Patient Instructions (Addendum)
Karen [name] , Thank you for taking time to come for your Medicare Wellness Visit. I appreciate your ongoing commitment to your health goals. Please review the following plan we discussed and let me know if I can assist you in the future.   These are the goals we discussed: Goals    . Increase water intake          Starting 07/20/16, I will attempt to increase my water intake to 5 bottles per day.        This is a list of the screening recommended for you and due dates:  Health Maintenance  Topic Date Due  . Shingles Vaccine  04/12/1986  . Eye exam for diabetics  09/26/2015  . Complete foot exam   11/12/2015  . Hemoglobin A1C  01/16/2017  . Urine Protein Check  07/18/2017  . Tetanus Vaccine  07/21/2023  . Flu Shot  Completed  . DEXA scan (bone density measurement)  Completed  . Pneumonia vaccines  Completed  Preventive Care for Adults  A healthy lifestyle and preventive care can promote health and wellness. Preventive health guidelines for adults include the following key practices.  . A routine yearly physical is a good way to check with your health care provider about your health and preventive screening. It is a chance to share any concerns and updates on your health and to receive a thorough exam.  . Visit your dentist for a routine exam and preventive care every 6 months. Brush your teeth twice a day and floss once a day. Good oral hygiene prevents tooth decay and gum disease.  . The frequency of eye exams is based on your age, health, family medical history, use  of contact lenses, and other factors. Follow your health care provider's ecommendations for frequency of eye exams.  . Eat a healthy diet. Foods like vegetables, fruits, whole grains, low-fat dairy products, and lean protein foods contain the nutrients you need without too many calories. Decrease your intake of foods high in solid fats, added sugars, and salt. Eat the right amount of calories for you. Get  information about a proper diet from your health care provider, if necessary.  . Regular physical exercise is one of the most important things you can do for your health. Most adults should get at least 150 minutes of moderate-intensity exercise (any activity that increases your heart rate and causes you to sweat) each week. In addition, most adults need muscle-strengthening exercises on 2 or more days a week.  Silver Sneakers may be a benefit available to you. To determine eligibility, you may visit the website: www.silversneakers.com or contact program at 972-294-9384 Mon-Fri between 8AM-8PM.   . Maintain a healthy weight. The body mass index (BMI) is a screening tool to identify possible weight problems. It provides an estimate of body fat based on height and weight. Your health care provider can find your BMI and can help you achieve or maintain a healthy weight.   For adults 20 years and older: ? A BMI below 18.5 is considered underweight. ? A BMI of 18.5 to 24.9 is normal. ? A BMI of 25 to 29.9 is considered overweight. ? A BMI of 30 and above is considered obese.   . Maintain normal blood lipids and cholesterol levels by exercising and minimizing your intake of saturated fat. Eat a balanced diet with plenty of fruit and vegetables. Blood tests for lipids and cholesterol should begin at age 53 and be repeated every 5 years.  If your lipid or cholesterol levels are high, you are over 50, or you are at high risk for heart disease, you may need your cholesterol levels checked more frequently. Ongoing high lipid and cholesterol levels should be treated with medicines if diet and exercise are not working.  . If you smoke, find out from your health care provider how to quit. If you do not use tobacco, please do not start.  . If you choose to drink alcohol, please do not consume more than 2 drinks per day. One drink is considered to be 12 ounces (355 mL) of beer, 5 ounces (148 mL) of wine, or 1.5  ounces (44 mL) of liquor.  . If you are 12-10 years old, ask your health care provider if you should take aspirin to prevent strokes.  . Use sunscreen. Apply sunscreen liberally and repeatedly throughout the day. You should seek shade when your shadow is shorter than you. Protect yourself by wearing long sleeves, pants, a wide-brimmed hat, and sunglasses year round, whenever you are outdoors.  . Once a month, do a whole body skin exam, using a mirror to look at the skin on your back. Tell your health care provider of new moles, moles that have irregular borders, moles that are larger than a pencil eraser, or moles that have changed in shape or color.

## 2016-07-20 NOTE — Progress Notes (Signed)
Subjective:   Karen [name] is a 82 y.o. female who presents for an Initial Medicare Annual Wellness Visit.  Review of Systems     Cardiac Risk Factors include: advanced age (>36mn, >>73women);diabetes mellitus;family history of premature cardiovascular disease;hypertension     Objective:    Today's Vitals   07/20/16 0954  BP: (!) 148/54  Pulse: 86  Temp: 97.6 F (36.4 C)  TempSrc: Oral  SpO2: 96%  Weight: 135 lb 9.6 oz (61.5 kg)  Height: '5\' 5"'$  (1.651 m)  PainSc: 7    Body mass index is 22.57 kg/m.   Current Medications (verified) Outpatient Encounter Prescriptions as of 07/20/2016  Medication Sig  . ALPRAZolam (XANAX) 0.5 MG tablet Take one tablet by mouth up to 3 times daily as needed for anxiety or sleep  . aspirin EC 81 MG tablet One daily to help prevent stroke and heart attack  . Blood Glucose Monitoring Suppl (BAYER BREEZE 2 SYSTEM) W/DEVICE KIT   . CRANBERRY PO Take 300 mg by mouth daily.  . [facility] NATURAL LUTEIN EYE HEALTH PO Take by mouth. One tablet once daily for eyes  . digoxin (LANOXIN) 0.125 MG tablet TAKE 1 TABLET BY MOUTH ON MONDAY, WEDNESDAY, FRIDAY AND SATURDAY  . diphenhydrAMINE-zinc acetate (BENADRYL ITCH STOPPING) cream Apply 1 application topically 3 (three) times daily as needed.  . furosemide (LASIX) 40 MG tablet One daily to control edema  . Glucose Blood (BAYER BREEZE 2 TEST VI) Check blood sugar 3 x daily as directed. DX:250.02  . HYDROcodone-acetaminophen (NORCO/VICODIN) 5-325 MG tablet TAKE 1 TABLET BY MOUTH 4 TIMES A DAY AS NEEDED FOR PAIN.  .Marland Kitcheninsulin glargine (LANTUS) 100 UNIT/ML injection Inject 42 Units into the skin at bedtime.  . insulin lispro (HUMALOG KWIKPEN) 100 UNIT/ML KiwkPen Inject 10units before breakfast, 12 units before lunch, and 14units before supper to control sugar. May use up to 3 more units per meal as needed  . Insulin Pen Needle 29G X 12.7MM MISC Use as directed to administer insulin. DX: 250.40  . Insulin  Syringe-Needle U-100 (EASY COMFORT INSULIN SYRINGE) 30G X 1/2" 0.5 ML MISC Use as Directed with insulin injections. Dx: 250.00  . Lancet Devices (LANCING DEVICE) MISC   . levothyroxine (SYNTHROID, LEVOTHROID) 125 MCG tablet TAKE 1 TABLET BY MOUTH DAILY BEFORE BREAKFAST.  . metoprolol (LOPRESSOR) 50 MG tablet One twice daily to control heart rate  . Multiple Vitamins-Minerals (PRESERVISION AREDS 2 PO) Take by mouth. 1 by mouth two times daily  . omeprazole (PRILOSEC) 40 MG capsule TAKE ONE (1) CAPSULE BY MOUTH 2 TIMES DAILY FOR STOMACH  . ondansetron (ZOFRAN) 4 MG tablet TAKE 1 TABLET EVERY 4 HOURS AS NEEDED FOR NAUSEA/VOMITING  . Polyethyl Glycol-Propyl Glycol (SYSTANE OP) Apply to eye. One drop both eyes twice daily for dry eyes  . tiZANidine (ZANAFLEX) 4 MG tablet Take one tablet by mouth three times daily as needed to relax muscles  . trimethoprim (TRIMPEX) 100 MG tablet TAKE ONE TABLET BY MOUTH EVERY NIGHT AT BEDTIME TO PREVENT BLADDER INFECTION  . urea (CARMOL) 20 % cream Apply daily to the callus on the heels  . Vitamin D, Ergocalciferol, (DRISDOL) 50000 units CAPS capsule TAKE 1 CAPSULE BY MOUTH ONCE A WEEK  . VITAMIN E PO Take 400 Units by mouth daily.  . chlorpheniramine-HYDROcodone (TUSSIONEX PENNKINETIC ER) 10-8 MG/5ML LQCR 5 cc every 12 hours to control cough  . desoximetasone (TOPICORT) 0.25 % cream Apply to lesion daily  . Insulin Pen Needle (  B-D ULTRAFINE III SHORT PEN) 31G X 8 MM MISC Use with administering insulin. Dx. E11.29  . lidocaine-prilocaine (EMLA) cream Apply nightly to painful areas  . [DISCONTINUED] AFLURIA SUSP   . [DISCONTINUED] fluocinonide cream (LIDEX) 1.61 % Apply 1 application topically 2 (two) times daily.  . [DISCONTINUED] omeprazole (PRILOSEC) 40 MG capsule Take one capsule by mouth twice daily for stomach  . [DISCONTINUED] primidone (MYSOLINE) 250 MG tablet One twice daily to help control tremor (Patient not taking: Reported on 06/16/2016)   No  facility-administered encounter medications on file as of 07/20/2016.     Allergies (verified) Celebrex [celecoxib]; Doxycycline; Lyrica [pregabalin]; Avandia [rosiglitazone]; Macrodantin [nitrofurantoin macrocrystal]; Sulfa antibiotics; Vioxx [rofecoxib]; and Silicone   History: Past Medical History:  Diagnosis Date  . A-fib ([place])   . Abnormal involuntary movements(781.0)   . Anemia   . Anxiety   . Anxiety state, unspecified   . Arthritis    body, neck  . Benign hypertensive heart disease   . Benign hypertensive heart disease with congestive heart failure ([place])   . Bladder disorder    over active  . Cancer ([place])    skin, removed  . Cervicalgia   . CHF (congestive heart failure) ([place])   . Chronic kidney disease, stage III (moderate)   . Congestive heart failure, unspecified   . Contact dermatitis and other eczema, due to unspecified cause   . Corns and callosities   . Diarrhea   . Dyspnea 02/18/2015  . Edema   . Esophageal stricture   . Gastroparesis   . Gastroparesis   . GERD (gastroesophageal reflux disease)   . Hiatal hernia   . Hyperlipidemia   . Inflamed seborrheic keratosis   . Inflammatory disease of breast   . Irregular heartbeat   . Lumbago   . Mixed incontinence urge and stress (female)(female)   . Nausea alone   . Nonspecific (abnormal) findings on radiological and other examination of skull and head   . Osteoarthrosis, unspecified whether generalized or localized, unspecified site   . Other and unspecified hyperlipidemia   . Other B-complex deficiencies   . Other functional disorder of bladder   . Other malaise and fatigue   . Other specified visual disturbances   . Pain in joint, lower leg   . Palpitations   . Peripheral vascular disease, unspecified   . Postmenopausal atrophic vaginitis   . Reflux esophagitis   . Seborrheic keratosis   . Sleep apnea   . Spasm of muscle   . Spinal stenosis   . Spinal stenosis, unspecified region other than cervical    . Stricture and stenosis of esophagus   . Stroke ([place])   . TIA (transient ischemic attack) 2012  . Transient ischemic attack (TIA), and cerebral infarction without residual deficits(V12.54)   . Type II or unspecified type diabetes mellitus with renal manifestations, not stated as uncontrolled(250.40)   . Unspecified constipation   . Unspecified essential hypertension   . Unspecified hereditary and idiopathic peripheral neuropathy   . Unspecified hypothyroidism   . Unspecified pruritic disorder   . Unspecified sleep apnea   . Unspecified vitamin D deficiency   . Urinary tract infection, site not specified   . Vitamin B12 deficiency    Past Surgical History:  Procedure Laterality Date  . ABDOMINAL HYSTERECTOMY    . BACK SURGERY     x 2  . CHOLECYSTECTOMY  1991  . esophageal  2004,2006   dilation, Dr Lyla Son  . EYE SURGERY  [place]   bilateral cataract  . hammer toes    . HERNIA REPAIR  1984aaaaaaaa  . SKIN CANCER DESTRUCTION    . THYROIDECTOMY, PARTIAL  1965   Family History  Problem Relation Age of Onset  . Cancer Mother     ? stomach  . Heart disease Father   . Hyperlipidemia Father   . Hypertension Father   . Heart attack Father   . Heart disease Brother   . Kidney disease Daughter   . Heart disease Brother   . Cancer Son    Social History   Occupational History  . retired    Social History Main Topics  . Smoking status: Never Smoker  . Smokeless tobacco: Never Used  . Alcohol use No  . Drug use: No  . Sexual activity: No    Tobacco Counseling Counseling given: No   Activities of Daily Living In your present state of health, do you have any difficulty performing the following activities: 07/20/2016 05/13/2016  Hearing? N N  Vision? Y N  Difficulty concentrating or making decisions? Y N  Walking or climbing stairs? Y Y  Dressing or bathing? N N  Doing errands, shopping? N N  Preparing Food and eating ? N N  Using the Toilet? N N  In the  past six months, have you accidently leaked urine? Y N  Do you have problems with loss of bowel control? N N  Managing your Medications? N N  Managing your Finances? Tempie Donning  Housekeeping or managing your Housekeeping? N Y  Some recent data might be hidden    Immunizations and Health Maintenance Immunization History  Administered Date(s) Administered  . Influenza Split 04/14/2010, 04/20/2011, 05/03/2012  . Influenza Whole 04/21/2016  . Influenza,inj,Quad PF,36+ Mos 05/15/2013, 06/03/2014  . Influenza-Unspecified 05/15/2015  . Pneumococcal Conjugate-13 09/23/2014  . Pneumococcal Polysaccharide-23 01/27/2016  . Tdap 07/20/2013   Health Maintenance Due  Topic Date Due  . ZOSTAVAX  04/12/1986  . OPHTHALMOLOGY EXAM  09/26/2015  . FOOT EXAM  11/12/2015    Patient Care Team: Estill Dooms, MD as PCP - General (Internal Medicine) Leia Alf, DPM (Inactive) as Attending Physician (Podiatry)  Indicate any recent Medical Services you may have received from other than [facility] providers in the past year (date may be approximate).     Assessment:   This is a routine wellness examination for [facility].   Hearing/Vision screen  Hearing Screening   '125Hz'$  '250Hz'$  '500Hz'$  '1000Hz'$  '2000Hz'$  '3000Hz'$  '4000Hz'$  '6000Hz'$  '8000Hz'$   Right ear:   0 0 100  0    Left ear:   0 0 100  100    Comments: Pt has not had a hearing screen in several years; Pt states she has not noticed a decrease in hearing; failed office screen. Declined referral.  Vision Screening Comments: Last eye exam w/ Dr. Bing Plume in April 2017.   Dietary issues and exercise activities discussed: Current Exercise Habits: Home exercise routine, Type of exercise: stretching, Time (Minutes): 15, Frequency (Times/Week): 6, Weekly Exercise (Minutes/Week): 90, Intensity: Mild, Exercise limited by: neurologic condition(s)  Goals    . Increase water intake          Starting 07/20/16, I will attempt to increase my water intake to 5 bottles per day.         Depression Screen PHQ 2/9 Scores 07/20/2016 05/13/2016 01/27/2016 02/18/2015 01/14/2015 11/12/2014 01/15/2014  PHQ - 2 Score 0 0 0 0 0 2 0  PHQ- 9 Score - - - - -  4 -    Fall Risk Fall Risk  07/20/2016 06/16/2016 06/10/2016 05/19/2016 05/13/2016  Falls in the past year? Yes (No Data) (No Data) (No Data) (No Data)  Number falls in past yr: 2 or more - - 2 or more -  Injury with Fall? Yes - - Yes -  Risk Factor Category  High Fall Risk - - High Fall Risk -  Risk for fall due to : History of fall(s);Impaired balance/gait;Impaired mobility - - History of fall(s);Impaired balance/gait;Impaired mobility;Medication side effect -  Follow up Falls prevention discussed - - Falls evaluation completed;Education provided;Falls prevention discussed -    Cognitive Function: MMSE - Mini Mental State Exam 07/20/2016  Orientation to time 5  Orientation to Place 5  Registration 3  Attention/ Calculation 4  Recall 3  Language- name 2 objects 2  Language- repeat 1  Language- follow 3 step command 3  Language- read & follow direction 1  Write a sentence 1  Copy design 0  Total score 28        Screening Tests Health Maintenance  Topic Date Due  . ZOSTAVAX  04/12/1986  . OPHTHALMOLOGY EXAM  09/26/2015  . FOOT EXAM  11/12/2015  . HEMOGLOBIN A1C  01/16/2017  . URINE MICROALBUMIN  07/18/2017  . TETANUS/TDAP  07/21/2023  . INFLUENZA VACCINE  Completed  . DEXA SCAN  Completed  . PNA vac Low Risk Adult  Completed      Plan:    I have personally reviewed and addressed the Medicare Annual Wellness questionnaire and have noted the following in the patient's chart:  A. Medical and social history B. Use of alcohol, tobacco or illicit drugs  C. Current medications and supplements D. Functional ability and status E.  Nutritional status F.  Physical activity G. Advance directives H. List of other physicians I.  Hospitalizations, surgeries, and ER visits in previous 12 months J.   [place] to include hearing, vision, cognitive, depression L. Referrals and appointments - none  In addition, I have reviewed and discussed with patient certain preventive protocols, quality metrics, and best practice recommendations. A written personalized care plan for preventive services as well as general preventive health recommendations were provided to patient.  See attached scanned questionnaire for additional information.   Signed,   Allyn Kenner, LPN Health Advisor  I have reviewed the information entered by the Health Advisor. I was present in the office during the time of patient interaction and was available for consultation. I agree with the documentation and advice.  Viviann Spare Nyoka Cowden, MD

## 2016-07-22 NOTE — Progress Notes (Deleted)
Facility  [facility]    Place of Service:   OFFICE    Allergies  Allergen Reactions  . Celebrex [Celecoxib] Rash  . Doxycycline Rash  . Lyrica [Pregabalin] Rash  . Avandia [Rosiglitazone]   . Macrodantin [Nitrofurantoin Macrocrystal]   . Sulfa Antibiotics   . Vioxx [Rofecoxib]   . Silicone Rash    Gets rash from the touch it.    Chief Complaint  Patient presents with  . Medical Management of Chronic Issues    2 month medication management blood sugar, blood pressure, tremor, thyroid, CKD.  . Fall    07/15/16 knee gave out  . Foot Pain    at night    HPI:   Uncontrolled type 2 diabetes mellitus with stage 2 chronic kidney disease, with long-term current use of insulin (HCC) - running a low glucose in the mrnings  Pain in joint involving ankle and foot, unspecified laterality - left foot aches at night  Essential hypertension - mild elevation in the SBP  Hypothyroidism, unspecified type - Compensated  Chronic kidney disease, stage III (moderate) - unchanged  Tremor - Primidone started last visit was unable to be tolerated. Headache, rash, nausea, dizziness after 2 doses. She did not tremble as back. Currently the tremor is bad. Xanax still helps for about 2 hours.   Large bruise on the right humerus due to a fall. Sprained the right ankle. Better, but still swollen.   Medications: Patient's Medications  New Prescriptions   GABAPENTIN (NEURONTIN) 100 MG CAPSULE    Take one capsule at bedtime to help tremor. Increase by 1 capsule weekly until you are taking 3 capsules at bedtime.  Previous Medications   ALPRAZOLAM (XANAX) 0.5 MG TABLET    Take one tablet by mouth up to 3 times daily as needed for anxiety or sleep   ASPIRIN EC 81 MG TABLET    One daily to help prevent stroke and heart attack   BLOOD GLUCOSE MONITORING SUPPL (BAYER BREEZE 2 SYSTEM) W/DEVICE KIT       CHLORPHENIRAMINE-HYDROCODONE (TUSSIONEX PENNKINETIC ER) 10-8 MG/5ML LQCR    5 cc every 12 hours to  control cough   CRANBERRY PO    Take 300 mg by mouth daily.   [facility] NATURAL LUTEIN EYE HEALTH PO    Take by mouth. One tablet once daily for eyes   DESOXIMETASONE (TOPICORT) 0.25 % CREAM    Apply to lesion daily   DIGOXIN (LANOXIN) 0.125 MG TABLET    TAKE 1 TABLET BY MOUTH ON MONDAY, WEDNESDAY, FRIDAY AND SATURDAY   DIPHENHYDRAMINE-ZINC ACETATE (BENADRYL ITCH STOPPING) CREAM    Apply 1 application topically 3 (three) times daily as needed.   FUROSEMIDE (LASIX) 40 MG TABLET    One daily to control edema   GLUCOSE BLOOD (BAYER BREEZE 2 TEST VI)    Check blood sugar 3 x daily as directed. DX:250.02   HYDROCODONE-ACETAMINOPHEN (NORCO/VICODIN) 5-325 MG TABLET    TAKE 1 TABLET BY MOUTH 4 TIMES A DAY AS NEEDED FOR PAIN.   INSULIN GLARGINE (LANTUS) 100 UNIT/ML INJECTION    Inject 35 Units into the skin at bedtime.   INSULIN LISPRO (HUMALOG KWIKPEN) 100 UNIT/ML KIWKPEN    Inject 10units before breakfast, 12 units before lunch, and 14units before supper to control sugar. May use up to 3 more units per meal as needed   INSULIN PEN NEEDLE (B-D ULTRAFINE III SHORT PEN) 31G X 8 MM MISC    Use with administering insulin. Dx. E11.29  INSULIN PEN NEEDLE 29G X 12.7MM MISC    Use as directed to administer insulin. DX: 250.40   INSULIN SYRINGE-NEEDLE U-100 (EASY COMFORT INSULIN SYRINGE) 30G X 1/2" 0.5 ML MISC    Use as Directed with insulin injections. Dx: 250.00   LANCET DEVICES (LANCING DEVICE) MISC       LEVOTHYROXINE (SYNTHROID, LEVOTHROID) 125 MCG TABLET    TAKE 1 TABLET BY MOUTH DAILY BEFORE BREAKFAST.   LIDOCAINE-PRILOCAINE (EMLA) CREAM    Apply nightly to painful areas   METOPROLOL (LOPRESSOR) 50 MG TABLET    One twice daily to control heart rate   MULTIPLE VITAMINS-MINERALS (PRESERVISION AREDS 2 PO)    Take by mouth. 1 by mouth two times daily   OMEPRAZOLE (PRILOSEC) 40 MG CAPSULE    TAKE ONE (1) CAPSULE BY MOUTH 2 TIMES DAILY FOR STOMACH   ONDANSETRON (ZOFRAN) 4 MG TABLET    TAKE 1 TABLET EVERY 4 HOURS AS  NEEDED FOR NAUSEA/VOMITING   POLYETHYL GLYCOL-PROPYL GLYCOL (SYSTANE OP)    Apply to eye. One drop both eyes twice daily for dry eyes   TIZANIDINE (ZANAFLEX) 4 MG TABLET    Take one tablet by mouth three times daily as needed to relax muscles   TRIMETHOPRIM (TRIMPEX) 100 MG TABLET    TAKE ONE TABLET BY MOUTH EVERY NIGHT AT BEDTIME TO PREVENT BLADDER INFECTION   UREA (CARMOL) 20 % CREAM    Apply daily to the callus on the heels   VITAMIN D, ERGOCALCIFEROL, (DRISDOL) 50000 UNITS CAPS CAPSULE    TAKE 1 CAPSULE BY MOUTH ONCE A WEEK   VITAMIN E PO    Take 400 Units by mouth daily.  Modified Medications   No medications on file  Discontinued Medications   No medications on file    Review of Systems  Constitutional: Positive for chills and fatigue. Negative for activity change, appetite change, fever and unexpected weight change.  Eyes: Negative.   Respiratory: Negative for shortness of breath and wheezing.   Cardiovascular: Positive for leg swelling. Negative for chest pain and palpitations.       Bilateral mild peripheral arterial disease. She has been evaluated by Dr. Ruta Hinds, vascular surgery, on 06/21/12. He felt that she had bilateral mild peripheral arterial disease. Leg discomfort seems to be more related to her chronic back problems. She has mild external iliac artery stenosis, but does not have severe occlusive disease. No surgical intervention is warranted.  Gastrointestinal: Positive for nausea.       Spastic colon. Intermittent diarrhea. Has more problems with chronic constipation. Mild dysphagia.  Endocrine: Negative for polydipsia, polyphagia and polyuria.       Diabetic. History of thyroid problems. Thyroid has been removed. She is on supplements.  Genitourinary:       Urinary leakage and incontinence. Nocturia. Denies dysuria.  Musculoskeletal: Positive for arthralgias, back pain, gait problem, joint swelling and myalgias.       Complaints of muscular weakness. Chronic  backache. Arthritis in other joints. Unsteady gait. Bilateral lower leg discomfort. Bilateral knee pains Enlarged end of the right clavicle at the manubrium. Pain in the right shoulder . Difficulty raising her arm. History of degenerative changes on Xray Dec. 2015. Left knee "gave out" and caused her to fall 05/04/16. Was seen at ED. tricompartment OA on xrays.  Skin:       Seborrheic keratosis of the left breast. Generalized itching without rash. Ingrown right great toenail.  Neurological: Positive for tremors and headaches.  Has difficulty with balance. There are episodes of dizziness. She experiences numbness in her feet. She walks wobbly and unsteady. His numbness and weakness in the left leg.  Hematological: Negative.   Psychiatric/Behavioral:       Chronic anxiety. Sleeps okay.    Vitals:   07/20/16 1039  BP: (!) 148/54  Pulse: 86  Temp: 97.6 F (36.4 C)  TempSrc: Oral  SpO2: 96%  Weight: 135 lb (61.2 kg)  Height: '5\' 5"'$  (1.651 m)   Body mass index is 22.47 kg/m. Wt Readings from Last 3 Encounters:  07/20/16 135 lb (61.2 kg)  07/20/16 135 lb (61.2 kg)  07/20/16 135 lb 9.6 oz (61.5 kg)      Physical Exam  Constitutional: She is oriented to person, place, and time.  Frail, thin, elderly female.  HENT:  Head: Normocephalic and atraumatic.  Mouth/Throat: No oropharyngeal exudate.  Eyes: Conjunctivae and EOM are normal. Pupils are equal, round, and reactive to light.  Neck: No JVD present. No tracheal deviation present.  Nuchal rigidity. History thyroidectomy.  Cardiovascular: Normal rate and regular rhythm.  Exam reveals no gallop and no friction rub.   No murmur heard. Rate 60 Diminished pedal pulses  Pulmonary/Chest: No respiratory distress. She has no wheezes. She has no rales.  Abdominal: Soft. Bowel sounds are normal. She exhibits no distension and no mass. There is no tenderness.  Musculoskeletal: She exhibits edema (2+ bipedal).  Stiff neck. Right  shoulder discomfort on palpation. Unstable gait. Tender processes of the lumbar area. Bilateral lower paraspinal muscular tenderness.  Bulbous, tender knees with increased stiffness and crepitus. Left knee worse than the right.  Lymphadenopathy:    She has no cervical adenopathy.  Neurological: She is alert and oriented to person, place, and time. No cranial nerve deficit. Coordination abnormal.  Difficulty with balance and with walking.  Skin: No rash noted. No erythema. No pallor.  Seborrheic keratosis left breast. Mildly ingrown right great toenail.  Psychiatric: Her behavior is normal. Thought content normal.  Moderate anxiety.    Labs reviewed: Lab Summary Latest Ref Rng & Units 07/18/2016 05/11/2016 05/04/2016  Hemoglobin 12.0 - 15.0 g/dL (None) (None) 12.6  Hematocrit 36.0 - 46.0 % (None) (None) 37.0  White count - (None) (None) (None)  Platelet count - (None) (None) (None)  Sodium 135 - 146 mmol/L 141 139 142  Potassium 3.5 - 5.3 mmol/L 4.6 4.6 5.1  Calcium 8.6 - 10.4 mg/dL 8.8 8.9 (None)  Phosphorus - (None) (None) (None)  Creatinine 0.60 - 0.88 mg/dL 1.42(H) 1.72(H) 1.20(H)  AST 10 - 35 U/L 25 33 (None)  Alk Phos 33 - 130 U/L 97 101 (None)  Bilirubin 0.2 - 1.2 mg/dL 0.5 0.5 (None)  Glucose 65 - 99 mg/dL 100(H) 129(H) 170(H)  Cholesterol 125 - 200 mg/dL (None) 147 (None)  HDL cholesterol >=46 mg/dL (None) 27(L) (None)  Triglycerides <150 mg/dL (None) 140 (None)  LDL Direct - (None) (None) (None)  LDL Calc <130 mg/dL (None) 92 (None)  Total protein 6.1 - 8.1 g/dL 6.5 6.5 (None)  Albumin 3.6 - 5.1 g/dL 4.1 3.9 (None)  Some recent data might be hidden   Lab Results  Component Value Date   TSH 1.46 07/18/2016   TSH 1.47 05/11/2016   TSH 5.770 (H) 01/18/2016   Lab Results  Component Value Date   BUN 27 (H) 07/18/2016   BUN 30 (H) 05/11/2016   BUN 32 (H) 05/04/2016   Lab Results  Component Value Date   HGBA1C 5.8 (  H) 07/18/2016   HGBA1C 6.2 (H) 05/11/2016    HGBA1C 8.0 (H) 01/18/2016    Assessment/Plan  1. Pain in joint involving ankle and foot, unspecified laterality Continue current pain meds  2. Uncontrolled type 2 diabetes mellitus with stage 2 chronic kidney disease, with long-term current use of insulin (HCC) - Hemoglobin A1c; Future - Comprehensive metabolic panel; Future - Lipid panel; Future - Microalbumin, urine; Future  3. Essential hypertension - Comprehensive metabolic panel; Future  4. Hypothyroidism, unspecified type - TSH; Future  5. Chronic kidney disease, stage III (moderate) -CMP, future  6. Tremor - gabapentin (NEURONTIN) 100 MG capsule; Take one capsule at bedtime to help tremor. Increase by 1 capsule weekly until you are taking 3 capsules at bedtime.  Dispense: 90 capsule; Refill: 3  7. Hyperlipidemia, unspecified hyperlipidemia type -lipids in future

## 2016-07-22 NOTE — Progress Notes (Signed)
Patient ID: Karen [name], female   DOB: [birth year], 82 y.o.   MRN: [id number] See the other note on this date

## 2016-08-02 ENCOUNTER — Other Ambulatory Visit: Payer: Self-pay | Admitting: Internal Medicine

## 2016-08-02 ENCOUNTER — Other Ambulatory Visit: Payer: Self-pay

## 2016-08-02 DIAGNOSIS — M25571 Pain in right ankle and joints of right foot: Secondary | ICD-10-CM | POA: Diagnosis not present

## 2016-08-02 DIAGNOSIS — M25572 Pain in left ankle and joints of left foot: Secondary | ICD-10-CM | POA: Diagnosis not present

## 2016-08-02 MED ORDER — HYDROCODONE-ACETAMINOPHEN 5-325 MG PO TABS: ORAL_TABLET | ORAL | 0 refills | Status: DC

## 2016-08-02 NOTE — Telephone Encounter (Signed)
Reprinted prescription because I accidentally printed it for fax earlier today. Prescription is now on blue Rx paper and has been placed in Dr. Vale Haven folder for signing.

## 2016-08-08 ENCOUNTER — Other Ambulatory Visit: Payer: Self-pay | Admitting: Internal Medicine

## 2016-08-16 ENCOUNTER — Other Ambulatory Visit: Payer: Self-pay | Admitting: Nurse Practitioner

## 2016-08-16 DIAGNOSIS — F419 Anxiety disorder, unspecified: Secondary | ICD-10-CM

## 2016-09-01 ENCOUNTER — Other Ambulatory Visit: Payer: Self-pay | Admitting: Internal Medicine

## 2016-09-05 ENCOUNTER — Encounter: Payer: Self-pay | Admitting: Nurse Practitioner

## 2016-09-05 ENCOUNTER — Ambulatory Visit (INDEPENDENT_AMBULATORY_CARE_PROVIDER_SITE_OTHER): Payer: Medicare Other | Admitting: Nurse Practitioner

## 2016-09-05 VITALS — BP 134/88 | HR 63 | Temp 97.8°F | Resp 18 | Ht 65.0 in | Wt 139.6 lb

## 2016-09-05 DIAGNOSIS — R300 Dysuria: Secondary | ICD-10-CM

## 2016-09-05 DIAGNOSIS — R309 Painful micturition, unspecified: Secondary | ICD-10-CM | POA: Diagnosis not present

## 2016-09-05 LAB — POCT URINALYSIS DIPSTICK
BILIRUBIN UA: NEGATIVE
Glucose, UA: NEGATIVE
Ketones, UA: NEGATIVE
NITRITE UA: POSITIVE
PH UA: 5
Spec Grav, UA: 1.015
UROBILINOGEN UA: 0.2

## 2016-09-05 MED ORDER — AMOXICILLIN-POT CLAVULANATE 875-125 MG PO TABS: 1.0000 | ORAL_TABLET | Freq: Two times a day (BID) | ORAL | 0 refills | Status: DC

## 2016-09-05 NOTE — Patient Instructions (Addendum)
Increase fluids Will send augmentin 875-125 mg by mouth twice daily for 1 week-- urine sent for culture so may need to change medication based on sensitivities

## 2016-09-05 NOTE — Progress Notes (Signed)
Careteam: Patient Care Team: Estill Dooms, MD as PCP - General (Internal Medicine) Leia Alf, DPM (Inactive) as Attending Physician (Podiatry)  Advanced Directive information Does Patient Have a Medical Advance Directive?: Yes, Type of Advance Directive: [place];Living will  Allergies  Allergen Reactions  . Celebrex [Celecoxib] Rash  . Doxycycline Rash  . Lyrica [Pregabalin] Rash  . Avandia [Rosiglitazone]   . Macrodantin [Nitrofurantoin Macrocrystal]   . Sulfa Antibiotics   . Vioxx [Rofecoxib]   . Silicone Rash    Gets rash from the touch it.    Chief Complaint  Patient presents with  . Acute Visit    Painful urination (not burning) x 4 days     HPI: Patient is a 83 y.o. female seen in the office today due to possible UTI. Pt has hx of UTI and this feels very similar. Having burning when she urinates and sends chills throughout her body. This is the 5th day she has had symptoms. No fevers or chill.  No increase in back pain.  Hx of constipation, takes medication to control. No incontinence of bowel, has bladder leakage.  Had UTI pretty frequently but has been over a year since she has had them.    Review of Systems:  Review of Systems  Constitutional: Negative for activity change, appetite change and fatigue.  Gastrointestinal: Positive for constipation. Negative for abdominal distention and abdominal pain.  Genitourinary: Positive for dysuria and frequency. Negative for difficulty urinating, flank pain, hematuria, urgency, vaginal discharge and vaginal pain.    Past Medical History:  Diagnosis Date  . A-fib ([place])   . Abnormal involuntary movements(781.0)   . Anemia   . Anxiety   . Anxiety state, unspecified   . Arthritis    body, neck  . Benign hypertensive heart disease   . Benign hypertensive heart disease with congestive heart failure ([place])   . Bladder disorder    over active  . Cancer ([place])    skin, removed  .  Cervicalgia   . CHF (congestive heart failure) ([place])   . Chronic kidney disease, stage III (moderate)   . Congestive heart failure, unspecified   . Contact dermatitis and other eczema, due to unspecified cause   . Corns and callosities   . Diarrhea   . Dyspnea 02/18/2015  . Edema   . Esophageal stricture   . Gastroparesis   . Gastroparesis   . GERD (gastroesophageal reflux disease)   . Hiatal hernia   . Hyperlipidemia   . Inflamed seborrheic keratosis   . Inflammatory disease of breast   . Irregular heartbeat   . Lumbago   . Mixed incontinence urge and stress (female)(female)   . Nausea alone   . Nonspecific (abnormal) findings on radiological and other examination of skull and head   . Osteoarthrosis, unspecified whether generalized or localized, unspecified site   . Other and unspecified hyperlipidemia   . Other B-complex deficiencies   . Other functional disorder of bladder   . Other malaise and fatigue   . Other specified visual disturbances   . Pain in joint, lower leg   . Palpitations   . Peripheral vascular disease, unspecified   . Postmenopausal atrophic vaginitis   . Reflux esophagitis   . Seborrheic keratosis   . Sleep apnea   . Spasm of muscle   . Spinal stenosis   . Spinal stenosis, unspecified region other than cervical   . Stricture and stenosis of esophagus   .  Stroke (HCC)   . TIA (transient ischemic attack) 2012  . Transient ischemic attack (TIA), and cerebral infarction without residual deficits(V12.54)   . Type II or unspecified type diabetes mellitus with renal manifestations, not stated as uncontrolled(250.40)   . Unspecified constipation   . Unspecified essential hypertension   . Unspecified hereditary and idiopathic peripheral neuropathy   . Unspecified hypothyroidism   . Unspecified pruritic disorder   . Unspecified sleep apnea   . Unspecified vitamin D deficiency   . Urinary tract infection, site not specified   . Vitamin B12 deficiency     Past Surgical History:  Procedure Laterality Date  . ABDOMINAL HYSTERECTOMY    . BACK SURGERY     x 2  . CHOLECYSTECTOMY  1991  . esophageal  2004,2006   dilation, Dr Victorino Dike  . EYE SURGERY  1994, 1997   bilateral cataract  . hammer toes    . HERNIA REPAIR  1984aaaaaaaa  . SKIN CANCER DESTRUCTION    . THYROIDECTOMY, PARTIAL  1965   Social History:   reports that she has never smoked. She has never used smokeless tobacco. She reports that she does not drink alcohol or use drugs.  Family History  Problem Relation Age of Onset  . Cancer Mother     ? stomach  . Heart disease Father   . Hyperlipidemia Father   . Hypertension Father   . Heart attack Father   . Heart disease Brother   . Kidney disease Daughter   . Heart disease Brother   . Cancer Son     Medications: Patient's Medications  New Prescriptions   No medications on file  Previous Medications   ALPRAZOLAM (XANAX) 0.5 MG TABLET    TAKE ONE TABLET BY MOUTH THREE TIMES DAILY AS NEEDED FOR ANXIETY OR SLEEP   ASPIRIN EC 81 MG TABLET    One daily to help prevent stroke and heart attack   BLOOD GLUCOSE CALIBRATION (OT ULTRA/FASTTK CNTRL SOLN) SOLN       CHLORPHENIRAMINE-HYDROCODONE (TUSSIONEX PENNKINETIC ER) 10-8 MG/5ML LQCR    5 cc every 12 hours to control cough   CRANBERRY PO    Take 300 mg by mouth daily.   [facility] NATURAL LUTEIN EYE HEALTH PO    Take by mouth. One tablet once daily for eyes   DESOXIMETASONE (TOPICORT) 0.25 % CREAM    Apply to lesion daily   DIGOXIN (LANOXIN) 0.125 MG TABLET    TAKE 1 TABLET BY MOUTH ON MONDAY, WEDNESDAY, FRIDAY AND SATURDAY   DIPHENHYDRAMINE-ZINC ACETATE (BENADRYL ITCH STOPPING) CREAM    Apply 1 application topically 3 (three) times daily as needed.   FUROSEMIDE (LASIX) 40 MG TABLET    One daily to control edema   GABAPENTIN (NEURONTIN) 100 MG CAPSULE    Take 2 capsules by mouth at bedtime to help tremors.   HYDROCODONE-ACETAMINOPHEN (NORCO/VICODIN) 5-325 MG TABLET    TAKE 1  TABLET BY MOUTH 4 TIMES A DAY AS NEEDED FOR PAIN.   INSULIN GLARGINE (LANTUS) 100 UNIT/ML INJECTION    Inject 35 Units into the skin at bedtime.   INSULIN LISPRO (HUMALOG KWIKPEN) 100 UNIT/ML KIWKPEN    Inject 10units before breakfast, 12 units before lunch, and 14units before supper to control sugar. May use up to 3 more units per meal as needed   INSULIN PEN NEEDLE (B-D ULTRAFINE III SHORT PEN) 31G X 8 MM MISC    Use with administering insulin. Dx. E11.29   INSULIN PEN NEEDLE 29G  X 12.7MM MISC    Use as directed to administer insulin. DX: 250.40   INSULIN SYRINGE-NEEDLE U-100 (EASY COMFORT INSULIN SYRINGE) 30G X 1/2" 0.5 ML MISC    Use as Directed with insulin injections. Dx: 250.00   LANCET DEVICES (LANCING DEVICE) MISC       LEVOTHYROXINE (SYNTHROID, LEVOTHROID) 125 MCG TABLET    TAKE 1 TABLET BY MOUTH DAILY BEFORE BREAKFAST.   METOPROLOL (LOPRESSOR) 50 MG TABLET    One twice daily to control heart rate   MULTIPLE VITAMINS-MINERALS (PRESERVISION AREDS 2 PO)    Take by mouth. 1 by mouth two times daily   OMEPRAZOLE (PRILOSEC) 40 MG CAPSULE    TAKE ONE (1) CAPSULE BY MOUTH 2 TIMES DAILY FOR STOMACH   ONDANSETRON (ZOFRAN) 4 MG TABLET    TAKE 1 TABLET EVERY 4 HOURS AS NEEDED FOR NAUSEA/VOMITING   ONE TOUCH ULTRA TEST TEST STRIP    1 each by Other route 4 (four) times daily - after meals and at bedtime.    POLYETHYL GLYCOL-PROPYL GLYCOL (SYSTANE OP)    Apply to eye. One drop both eyes twice daily for dry eyes   TIZANIDINE (ZANAFLEX) 4 MG TABLET    Take one tablet by mouth three times daily as needed to relax muscles   TRIMETHOPRIM (TRIMPEX) 100 MG TABLET    TAKE ONE TABLET BY MOUTH EVERY NIGHT AT BEDTIME TO PREVENT BLADDER INFECTION.   UREA (CARMOL) 20 % CREAM    Apply daily to the callus on the heels   VITAMIN D, ERGOCALCIFEROL, (DRISDOL) 50000 UNITS CAPS CAPSULE    TAKE 1 CAPSULE BY MOUTH ONCE A WEEK   VITAMIN E PO    Take 400 Units by mouth daily.  Modified Medications   No medications on file   Discontinued Medications   BLOOD GLUCOSE MONITORING SUPPL (BAYER BREEZE 2 SYSTEM) W/DEVICE KIT       GABAPENTIN (NEURONTIN) 100 MG CAPSULE    Take one capsule at bedtime to help tremor. Increase by 1 capsule weekly until you are taking 3 capsules at bedtime.   GLUCOSE BLOOD (BAYER BREEZE 2 TEST VI)    Check blood sugar 3 x daily as directed. DX:250.02   LIDOCAINE-PRILOCAINE (EMLA) CREAM    Apply nightly to painful areas     Physical Exam:  Vitals:   09/05/16 1451  BP: 134/88  Pulse: 63  Resp: 18  Temp: 97.8 F (36.6 C)  TempSrc: Oral  SpO2: 96%  Weight: 139 lb 9.6 oz (63.3 kg)  Height: '5\' 5"'$  (1.651 m)   Body mass index is 23.23 kg/m.  Physical Exam  Constitutional: She is oriented to person, place, and time. She appears well-developed and well-nourished.  Cardiovascular: Normal rate, regular rhythm and normal heart sounds.   Pulmonary/Chest: Effort normal and breath sounds normal.  Abdominal: Soft. Bowel sounds are normal. She exhibits no distension. There is no tenderness. There is no rebound and no guarding.  Neurological: She is alert and oriented to person, place, and time.  Skin: Skin is warm and dry.    Labs reviewed: Basic Metabolic Panel:  Recent Labs  01/18/16 0915 05/04/16 2132 05/11/16 0909 07/18/16 0907  NA 144 142 139 141  K 4.6 5.1 4.6 4.6  CL 99 101 101 104  CO2 25  --  27 31  GLUCOSE 76 170* 129* 100*  BUN 32* 32* 30* 27*  CREATININE 1.57* 1.20* 1.72* 1.42*  CALCIUM 9.3  --  8.9 8.8  TSH 5.770*  --  1.47 1.46   Liver Function Tests:  Recent Labs  10/12/15 0903 05/11/16 0909 07/18/16 0907  AST 21 33 25  ALT '16 29 21  '$ ALKPHOS 97 101 97  BILITOT 0.3 0.5 0.5  PROT 6.9 6.5 6.5  ALBUMIN 4.4 3.9 4.1   No results for input(s): LIPASE, AMYLASE in the last 8760 hours. No results for input(s): AMMONIA in the last 8760 hours. CBC:  Recent Labs  05/04/16 2132  HGB 12.6  HCT 37.0   Lipid Panel:  Recent Labs  01/18/16 0915  05/11/16 0909  CHOL 182 147  HDL 31* 27*  LDLCALC 111* 92  TRIG 202* 140  CHOLHDL 5.9* 5.4*   TSH:  Recent Labs  01/18/16 0915 05/11/16 0909 07/18/16 0907  TSH 5.770* 1.47 1.46   A1C: Lab Results  Component Value Date   HGBA1C 5.8 (H) 07/18/2016     Assessment/Plan  1. Painful urination -UA with positive nitrates, leukocytes and blood. Will send off for culture and sensitivity  - Culture, Urine To increase hydration with water - amoxicillin-clavulanate (AUGMENTIN) 875-125 MG tablet; Take 1 tablet by mouth 2 (two) times daily.  Dispense: 14 tablet; Refill: 0  Jessica K. Harle Battiest  [facility] [phone] 8 am - 5 pm) [phone] (after hours)

## 2016-09-08 ENCOUNTER — Other Ambulatory Visit: Payer: Self-pay | Admitting: Nurse Practitioner

## 2016-09-08 ENCOUNTER — Ambulatory Visit (INDEPENDENT_AMBULATORY_CARE_PROVIDER_SITE_OTHER): Payer: Medicare Other | Admitting: Nurse Practitioner

## 2016-09-08 ENCOUNTER — Encounter: Payer: Self-pay | Admitting: Nurse Practitioner

## 2016-09-08 VITALS — BP 144/72 | HR 63 | Temp 97.3°F | Resp 18 | Ht 65.0 in | Wt 139.4 lb

## 2016-09-08 DIAGNOSIS — N3000 Acute cystitis without hematuria: Secondary | ICD-10-CM

## 2016-09-08 DIAGNOSIS — M7989 Other specified soft tissue disorders: Secondary | ICD-10-CM

## 2016-09-08 DIAGNOSIS — M79661 Pain in right lower leg: Secondary | ICD-10-CM | POA: Diagnosis not present

## 2016-09-08 LAB — URINE CULTURE

## 2016-09-08 MED ORDER — CIPROFLOXACIN HCL 250 MG PO TABS: 250.0000 mg | ORAL_TABLET | Freq: Two times a day (BID) | ORAL | 0 refills | Status: DC

## 2016-09-08 NOTE — Progress Notes (Signed)
Careteam: Patient Care Team: Estill Dooms, MD as PCP - General (Internal Medicine) Leia Alf, DPM (Inactive) as Attending Physician (Podiatry)  Advanced Directive information Does Patient Have a Medical Advance Directive?: Yes, Type of Advance Directive: [place];Living will  Allergies  Allergen Reactions  . Celebrex [Celecoxib] Rash  . Doxycycline Rash  . Lyrica [Pregabalin] Rash  . Avandia [Rosiglitazone]   . Macrodantin [Nitrofurantoin Macrocrystal]   . Sulfa Antibiotics   . Vioxx [Rofecoxib]   . Silicone Rash    Gets rash from the touch it.    Chief Complaint  Patient presents with  . Acute Visit    Extreme pain x 4 days that starts in right knee that shoots up to right hip.      HPI: Patient is a 83 y.o. female seen in the office today with c/o right posterior popliteal pain rated 10/10 when this began. Pt states that she was standing in the kitchen 4 days ago and the "shooting" "sharp" pain began abruptly while standing and cooking. She denies any trauma, fall, or injury. She describes the pain as traveling down the lateral aspect of the right lower extremity, and has now began traveling up the leg toward the right hip. She is unable to bend the right knee without eructating pain. She also reports that the right leg is significantly more swollen than the left. Also endorses tingling down the right leg with no decreases in sensation. Denies back pain or bowel or bladder incontinence. She has a history of arthritis, spinal stenosis, neuropathy and back surgery. She has been taking her muscle relaxant and PRN oxycodone 325 with moderate relief.  Pt states that she has been SOB and mild chest pain with rest related which has been ongoing for approximately one month. She states that she thinks that this is exacerbated by her pain.   Review of Systems:  Review of Systems  Constitutional: Positive for activity change. Negative for appetite change,  chills, diaphoresis, fatigue and fever.  HENT: Negative for congestion, sinus pain and sinus pressure.   Respiratory: Negative for cough, chest tightness, shortness of breath and wheezing.   Cardiovascular: Positive for chest pain, palpitations and leg swelling.       Right lower leg edema   Gastrointestinal: Negative for abdominal distention, abdominal pain, constipation, diarrhea, nausea and vomiting.  Genitourinary: Negative for difficulty urinating, enuresis, frequency and urgency.  Musculoskeletal: Positive for arthralgias and joint swelling.  Skin: Negative for color change.  Neurological: Negative for dizziness, tremors, seizures, weakness, light-headedness, numbness and headaches.       Tingling in right lower extremity  Psychiatric/Behavioral: Negative.     Past Medical History:  Diagnosis Date  . A-fib ([place])   . Abnormal involuntary movements(781.0)   . Anemia   . Anxiety   . Anxiety state, unspecified   . Arthritis    body, neck  . Benign hypertensive heart disease   . Benign hypertensive heart disease with congestive heart failure ([place])   . Bladder disorder    over active  . Cancer ([place])    skin, removed  . Cervicalgia   . CHF (congestive heart failure) ([place])   . Chronic kidney disease, stage III (moderate)   . Congestive heart failure, unspecified   . Contact dermatitis and other eczema, due to unspecified cause   . Corns and callosities   . Diarrhea   . Dyspnea 02/18/2015  . Edema   . Esophageal stricture   .  Gastroparesis   . Gastroparesis   . GERD (gastroesophageal reflux disease)   . Hiatal hernia   . Hyperlipidemia   . Inflamed seborrheic keratosis   . Inflammatory disease of breast   . Irregular heartbeat   . Lumbago   . Mixed incontinence urge and stress (female)(female)   . Nausea alone   . Nonspecific (abnormal) findings on radiological and other examination of skull and head   . Osteoarthrosis, unspecified whether generalized or localized,  unspecified site   . Other and unspecified hyperlipidemia   . Other B-complex deficiencies   . Other functional disorder of bladder   . Other malaise and fatigue   . Other specified visual disturbances   . Pain in joint, lower leg   . Palpitations   . Peripheral vascular disease, unspecified   . Postmenopausal atrophic vaginitis   . Reflux esophagitis   . Seborrheic keratosis   . Sleep apnea   . Spasm of muscle   . Spinal stenosis   . Spinal stenosis, unspecified region other than cervical   . Stricture and stenosis of esophagus   . Stroke ([place])   . TIA (transient ischemic attack) 2012  . Transient ischemic attack (TIA), and cerebral infarction without residual deficits(V12.54)   . Type II or unspecified type diabetes mellitus with renal manifestations, not stated as uncontrolled(250.40)   . Unspecified constipation   . Unspecified essential hypertension   . Unspecified hereditary and idiopathic peripheral neuropathy   . Unspecified hypothyroidism   . Unspecified pruritic disorder   . Unspecified sleep apnea   . Unspecified vitamin D deficiency   . Urinary tract infection, site not specified   . Vitamin B12 deficiency    Past Surgical History:  Procedure Laterality Date  . ABDOMINAL HYSTERECTOMY    . BACK SURGERY     x 2  . CHOLECYSTECTOMY  1991  . esophageal  2004,2006   dilation, Dr Lyla Son  . [place]   bilateral cataract  . hammer toes    . HERNIA REPAIR  1984aaaaaaaa  . SKIN CANCER DESTRUCTION    . THYROIDECTOMY, PARTIAL  1965   Social History:   reports that she has never smoked. She has never used smokeless tobacco. She reports that she does not drink alcohol or use drugs.  Family History  Problem Relation Age of Onset  . Cancer Mother     ? stomach  . Heart disease Father   . Hyperlipidemia Father   . Hypertension Father   . Heart attack Father   . Heart disease Brother   . Kidney disease Daughter   . Heart disease Brother   .  Cancer Son     Medications: Patient's Medications  New Prescriptions   No medications on file  Previous Medications   ALPRAZOLAM (XANAX) 0.5 MG TABLET    TAKE ONE TABLET BY MOUTH THREE TIMES DAILY AS NEEDED FOR ANXIETY OR SLEEP   ASPIRIN EC 81 MG TABLET    One daily to help prevent stroke and heart attack   BLOOD GLUCOSE CALIBRATION (OT ULTRA/FASTTK CNTRL SOLN) SOLN       CHLORPHENIRAMINE-HYDROCODONE (TUSSIONEX PENNKINETIC ER) 10-8 MG/5ML LQCR    5 cc every 12 hours to control cough   CIPROFLOXACIN (CIPRO) 250 MG TABLET    Take 1 tablet (250 mg total) by mouth 2 (two) times daily.   CRANBERRY PO    Take 300 mg by mouth daily.   [facility] NATURAL LUTEIN EYE HEALTH PO  Take by mouth. One tablet once daily for eyes   DESOXIMETASONE (TOPICORT) 0.25 % CREAM    Apply to lesion daily   DIGOXIN (LANOXIN) 0.125 MG TABLET    TAKE 1 TABLET BY MOUTH ON MONDAY, WEDNESDAY, FRIDAY AND SATURDAY   DIPHENHYDRAMINE-ZINC ACETATE (BENADRYL ITCH STOPPING) CREAM    Apply 1 application topically 3 (three) times daily as needed.   FUROSEMIDE (LASIX) 40 MG TABLET    One daily to control edema   GABAPENTIN (NEURONTIN) 100 MG CAPSULE    Take 2 capsules by mouth at bedtime to help tremors.   HYDROCODONE-ACETAMINOPHEN (NORCO/VICODIN) 5-325 MG TABLET    TAKE 1 TABLET BY MOUTH 4 TIMES A DAY AS NEEDED FOR PAIN.   INSULIN GLARGINE (LANTUS) 100 UNIT/ML INJECTION    Inject 35 Units into the skin at bedtime.   INSULIN LISPRO (HUMALOG KWIKPEN) 100 UNIT/ML KIWKPEN    Inject 10units before breakfast, 12 units before lunch, and 14units before supper to control sugar. May use up to 3 more units per meal as needed   INSULIN PEN NEEDLE (B-D ULTRAFINE III SHORT PEN) 31G X 8 MM MISC    Use with administering insulin. Dx. E11.29   INSULIN PEN NEEDLE 29G X 12.7MM MISC    Use as directed to administer insulin. DX: 250.40   INSULIN SYRINGE-NEEDLE U-100 (EASY COMFORT INSULIN SYRINGE) 30G X 1/2" 0.5 ML MISC    Use as Directed with insulin  injections. Dx: 250.00   LANCET DEVICES (LANCING DEVICE) MISC       LEVOTHYROXINE (SYNTHROID, LEVOTHROID) 125 MCG TABLET    TAKE 1 TABLET BY MOUTH DAILY BEFORE BREAKFAST.   METOPROLOL (LOPRESSOR) 50 MG TABLET    One twice daily to control heart rate   MULTIPLE VITAMINS-MINERALS (PRESERVISION AREDS 2 PO)    Take by mouth. 1 by mouth two times daily   OMEPRAZOLE (PRILOSEC) 40 MG CAPSULE    TAKE ONE (1) CAPSULE BY MOUTH 2 TIMES DAILY FOR STOMACH   ONDANSETRON (ZOFRAN) 4 MG TABLET    TAKE 1 TABLET EVERY 4 HOURS AS NEEDED FOR NAUSEA/VOMITING   ONE TOUCH ULTRA TEST TEST STRIP    1 each by Other route 4 (four) times daily - after meals and at bedtime.    POLYETHYL GLYCOL-PROPYL GLYCOL (SYSTANE OP)    Apply to eye. One drop both eyes twice daily for dry eyes   TIZANIDINE (ZANAFLEX) 4 MG TABLET    Take one tablet by mouth three times daily as needed to relax muscles   TRIMETHOPRIM (TRIMPEX) 100 MG TABLET    TAKE ONE TABLET BY MOUTH EVERY NIGHT AT BEDTIME TO PREVENT BLADDER INFECTION.   UREA (CARMOL) 20 % CREAM    Apply daily to the callus on the heels   VITAMIN D, ERGOCALCIFEROL, (DRISDOL) 50000 UNITS CAPS CAPSULE    TAKE 1 CAPSULE BY MOUTH ONCE A WEEK   VITAMIN E PO    Take 400 Units by mouth daily.  Modified Medications   No medications on file  Discontinued Medications   No medications on file     Physical Exam:  Vitals:   09/08/16 1559  BP: (!) 144/72  Pulse: 63  Resp: 18  Temp: 97.3 F (36.3 C)  TempSrc: Oral  SpO2: 97%  Weight: 139 lb 6.4 oz (63.2 kg)  Height: 5\' 5"  (1.651 m)   Body mass index is 23.2 kg/m.  Physical Exam  Constitutional: She is oriented to person, place, and time. She appears well-developed and well-nourished. She  appears distressed.  HENT:  Head: Normocephalic and atraumatic.  Eyes: Conjunctivae and EOM are normal. Pupils are equal, round, and reactive to light.  Cardiovascular: Normal rate, regular rhythm and normal heart sounds.   No murmur  heard. Diminished right lower dorsalis pedis pulse 1+  Pulmonary/Chest: Effort normal and breath sounds normal. No respiratory distress. She has no wheezes. She exhibits no tenderness.  Abdominal: Soft. Bowel sounds are normal. She exhibits no distension. There is no tenderness.  Musculoskeletal: She exhibits no edema or tenderness.  Right lower extremity post popliteal tenderness with decreased ROM and medial and lateral tenderness and swelling. No redness Negative spinal point tenderness   Neurological: She is alert and oriented to person, place, and time.  Equal sensation bilateral lower extremity. Medial and lateral. Proprioception positive. Has been bearing weight with rolling walker.  Hip ROM decreased anterior, posterior, adduction, and abduction.  Straight leg raise negative   Skin: Skin is warm and dry.  Psychiatric: She has a normal mood and affect. Her behavior is normal. Judgment and thought content normal.    Labs reviewed: Basic Metabolic Panel:  Recent Labs  01/18/16 0915 05/04/16 2132 05/11/16 0909 07/18/16 0907  NA 144 142 139 141  K 4.6 5.1 4.6 4.6  CL 99 101 101 104  CO2 25  --  27 31  GLUCOSE 76 170* 129* 100*  BUN 32* 32* 30* 27*  CREATININE 1.57* 1.20* 1.72* 1.42*  CALCIUM 9.3  --  8.9 8.8  TSH 5.770*  --  1.47 1.46   Liver Function Tests:  Recent Labs  10/12/15 0903 05/11/16 0909 07/18/16 0907  AST 21 33 25  ALT 16 29 21   ALKPHOS 97 101 97  BILITOT 0.3 0.5 0.5  PROT 6.9 6.5 6.5  ALBUMIN 4.4 3.9 4.1   No results for input(s): LIPASE, AMYLASE in the last 8760 hours. No results for input(s): AMMONIA in the last 8760 hours. CBC:  Recent Labs  05/04/16 2132  HGB 12.6  HCT 37.0   Lipid Panel:  Recent Labs  01/18/16 0915 05/11/16 0909  CHOL 182 147  HDL 31* 27*  LDLCALC 111* 92  TRIG 202* 140  CHOLHDL 5.9* 5.4*   TSH:  Recent Labs  01/18/16 0915 05/11/16 0909 07/18/16 0907  TSH 5.770* 1.47 1.46   A1C: Lab Results   Component Value Date   HGBA1C 5.8 (H) 07/18/2016     Assessment/Plan 1. Pain and swelling of lower leg, right -Will order doppler studies to assess for bakers cyst, DVT, or other abnormalities -Encouraged to use pain medication to decrease pain (grandson staying with her at this time) -Instructed to not take Zanaflex with the muscle relaxant  -In the meantime, if she begins to have increased pain, decreases in sensation, bowel or bladder dysfunction, worsening shortness of breath or chest pain to report to the ED for further workup.  - VAS [place] LOWER EXTREMITY VENOUS (DVT); Future  2. UTI sensitives reveal resistance to Augmentin, will stop this and start Cipro which has already been sent to pharmacy. To not take Zanaflex while on Cipro due to drug to drug interaction   Yajayra Feldt K. Harle Battiest  [facility] [phone] 8 am - 5 pm) [phone] (after hours)

## 2016-09-08 NOTE — Patient Instructions (Addendum)
Will get venous doppler on leg to rule out blood clot.   Do not take zanaflex while on cipro Pick up Cipro for UTI at pharmacy

## 2016-09-09 ENCOUNTER — Telehealth: Payer: Self-pay | Admitting: Nurse Practitioner

## 2016-09-09 ENCOUNTER — Other Ambulatory Visit: Payer: Self-pay | Admitting: Nurse Practitioner

## 2016-09-09 DIAGNOSIS — M79661 Pain in right lower leg: Secondary | ICD-10-CM

## 2016-09-09 DIAGNOSIS — M7989 Other specified soft tissue disorders: Principal | ICD-10-CM

## 2016-09-09 NOTE — Telephone Encounter (Signed)
If she is asking a lot of question and you would feel more comfortable with that, otherwise she is okay to use her pain medication PRN and get doppler done on Monday, she is aware of when to seek treatment in the ED as we discussed at her office visit

## 2016-09-09 NOTE — Telephone Encounter (Signed)
I tried, that was the soonest I could get her in. Should a clinical staff member speak with this patient?

## 2016-09-09 NOTE — Telephone Encounter (Signed)
The soonest I can get patient in for her doppler is at [facility] on Monday at 1 - Patient expressed concern that it was so far away since she is in so much pain. Patient would like to know if this is ok, she stated she is in a tremendous amount of pain

## 2016-09-09 NOTE — Telephone Encounter (Signed)
Pt was not taking her pain medication yesterday, make sure she is taking that to help with the pain. If that is the soonest we can get the doppler than I am not sure what other options we have, will another site be able to do it sooner?

## 2016-09-12 ENCOUNTER — Telehealth: Payer: Self-pay | Admitting: *Deleted

## 2016-09-12 ENCOUNTER — Ambulatory Visit (HOSPITAL_COMMUNITY)
Admission: RE | Admit: 2016-09-12 | Discharge: 2016-09-12 | Disposition: A | Discharge status: Home / Self Care | Payer: Medicare Other | Source: Ambulatory Visit | Attending: Nurse Practitioner | Admitting: Nurse Practitioner

## 2016-09-12 DIAGNOSIS — M79661 Pain in right lower leg: Secondary | ICD-10-CM | POA: Insufficient documentation

## 2016-09-12 DIAGNOSIS — M7989 Other specified soft tissue disorders: Secondary | ICD-10-CM | POA: Diagnosis not present

## 2016-09-12 NOTE — Telephone Encounter (Signed)
CALL REPORT:  Venous doppler is negative

## 2016-09-12 NOTE — Telephone Encounter (Signed)
I spoke to patient's son, [name], and he stated that there is a small bruise on patient's left shin. He stated that this bruise could have occurred while he has been transferring patient to vehicle or bed. He stated that patient's pain is about the same as it was during OV. He stated that patient has some redness to legs but they are not hot to touch. They are extremely sensitive to touch.   Patient is scheduled for appointment tomorrow at 3:45

## 2016-09-12 NOTE — Progress Notes (Signed)
VASCULAR LAB PRELIMINARY  PRELIMINARY  PRELIMINARY  PRELIMINARY  Right lower extremity venous duplex completed.    Preliminary report:  There is no DVT or SVT noted in the right lower extremity.   Cipriana Biller, RVT 09/12/2016, 1:25 PM

## 2016-09-12 NOTE — Telephone Encounter (Signed)
Is she having any bruising or bleeding in her lower leg or ankle area?

## 2016-09-13 ENCOUNTER — Encounter: Payer: Self-pay | Admitting: Nurse Practitioner

## 2016-09-13 ENCOUNTER — Ambulatory Visit (INDEPENDENT_AMBULATORY_CARE_PROVIDER_SITE_OTHER): Payer: Medicare Other | Admitting: Nurse Practitioner

## 2016-09-13 VITALS — BP 132/68 | HR 60 | Temp 97.6°F | Resp 18 | Wt 136.4 lb

## 2016-09-13 DIAGNOSIS — N3000 Acute cystitis without hematuria: Secondary | ICD-10-CM

## 2016-09-13 DIAGNOSIS — M7989 Other specified soft tissue disorders: Secondary | ICD-10-CM | POA: Diagnosis not present

## 2016-09-13 DIAGNOSIS — M79661 Pain in right lower leg: Secondary | ICD-10-CM

## 2016-09-13 MED ORDER — HYDROCODONE-ACETAMINOPHEN 10-325 MG PO TABS: 1.0000 | ORAL_TABLET | Freq: Four times a day (QID) | ORAL | 0 refills | Status: AC | PRN

## 2016-09-13 MED ORDER — HYDROCODONE-ACETAMINOPHEN 10-325 MG PO TABS: 1.0000 | ORAL_TABLET | Freq: Four times a day (QID) | ORAL | 0 refills | Status: DC | PRN

## 2016-09-13 NOTE — Progress Notes (Signed)
Careteam: Patient Care Team: Estill Dooms, MD as PCP - General (Internal Medicine) Leia Alf, DPM (Inactive) as Attending Physician (Podiatry)  Advanced Directive information Does Patient Have a Medical Advance Directive?: Yes, Type of Advance Directive: Living will  Allergies  Allergen Reactions  . Celebrex [Celecoxib] Rash  . Doxycycline Rash  . Lyrica [Pregabalin] Rash  . Avandia [Rosiglitazone]   . Macrodantin [Nitrofurantoin Macrocrystal]   . Sulfa Antibiotics   . Vioxx [Rofecoxib]   . Silicone Rash    Gets rash from the touch it.    Chief Complaint  Patient presents with  . Acute Visit    Right Leg pain      HPI: Patient is a 83 y.o. female seen in the office today to follow up right leg pain. ~10 days ago pt was in the kitchen when a sharp shooting pain occurred abruptly while cooking. No trauma, fall or injury noted. Pt report the pain travelled down the lateral aspect of the right lower leg and then started going up towards her hip. Increased edema noted. No bruising or heat noted. Reports the sheet hurts her leg to touch it. She is able to walk with pain. Taking hydrocodone/apap QID which helps bring the pain from a 10/7 or 8. Able to sit in her lounge chair, hard to get up and sit back down, bending her knee is the issue.  Pt reports knee and (upper) calf is very painful. Pt was sent for [place] to rule out DVT which was negative for DVT and bakers cyst.   Review of Systems:  Review of Systems  Constitutional: Positive for activity change. Negative for appetite change, chills, diaphoresis, fatigue and fever.  HENT: Negative for congestion, sinus pain and sinus pressure.   Respiratory: Negative for cough, chest tightness, shortness of breath and wheezing.   Cardiovascular: Positive for leg swelling. Negative for chest pain and palpitations.       Right lower leg edema   Gastrointestinal: Negative for abdominal distention, abdominal pain, constipation,  diarrhea, nausea and vomiting.  Genitourinary: Negative for difficulty urinating, enuresis, frequency and urgency.  Musculoskeletal: Positive for arthralgias and joint swelling.  Skin: Negative for color change.  Neurological: Negative for dizziness, tremors, seizures, weakness, light-headedness, numbness and headaches.       Tingling in right lower extremity  Psychiatric/Behavioral: Negative.     Past Medical History:  Diagnosis Date  . A-fib ([place])   . Abnormal involuntary movements(781.0)   . Anemia   . Anxiety   . Anxiety state, unspecified   . Arthritis    body, neck  . Benign hypertensive heart disease   . Benign hypertensive heart disease with congestive heart failure ([place])   . Bladder disorder    over active  . Cancer ([place])    skin, removed  . Cervicalgia   . CHF (congestive heart failure) ([place])   . Chronic kidney disease, stage III (moderate)   . Congestive heart failure, unspecified   . Contact dermatitis and other eczema, due to unspecified cause   . Corns and callosities   . Diarrhea   . Dyspnea 02/18/2015  . Edema   . Esophageal stricture   . Gastroparesis   . Gastroparesis   . GERD (gastroesophageal reflux disease)   . Hiatal hernia   . Hyperlipidemia   . Inflamed seborrheic keratosis   . Inflammatory disease of breast   . Irregular heartbeat   . Lumbago   . Mixed incontinence urge and stress (female)(female)   .  Nausea alone   . Nonspecific (abnormal) findings on radiological and other examination of skull and head   . Osteoarthrosis, unspecified whether generalized or localized, unspecified site   . Other and unspecified hyperlipidemia   . Other B-complex deficiencies   . Other functional disorder of bladder   . Other malaise and fatigue   . Other specified visual disturbances   . Pain in joint, lower leg   . Palpitations   . Peripheral vascular disease, unspecified   . Postmenopausal atrophic vaginitis   . Reflux esophagitis   . Seborrheic  keratosis   . Sleep apnea   . Spasm of muscle   . Spinal stenosis   . Spinal stenosis, unspecified region other than cervical   . Stricture and stenosis of esophagus   . Stroke ([place])   . TIA (transient ischemic attack) 2012  . Transient ischemic attack (TIA), and cerebral infarction without residual deficits(V12.54)   . Type II or unspecified type diabetes mellitus with renal manifestations, not stated as uncontrolled(250.40)   . Unspecified constipation   . Unspecified essential hypertension   . Unspecified hereditary and idiopathic peripheral neuropathy   . Unspecified hypothyroidism   . Unspecified pruritic disorder   . Unspecified sleep apnea   . Unspecified vitamin D deficiency   . Urinary tract infection, site not specified   . Vitamin B12 deficiency    Past Surgical History:  Procedure Laterality Date  . ABDOMINAL HYSTERECTOMY    . BACK SURGERY     x 2  . CHOLECYSTECTOMY  1991  . esophageal  2004,2006   dilation, Dr Lyla Son  [place]   bilateral cataract  . hammer toes    . HERNIA REPAIR  1984aaaaaaaa  . SKIN CANCER DESTRUCTION    . THYROIDECTOMY, PARTIAL  1965   Social History:   reports that she has never smoked. She has never used smokeless tobacco. She reports that she does not drink alcohol or use drugs.  Family History  Problem Relation Age of Onset  . Cancer Mother     ? stomach  . Heart disease Father   . Hyperlipidemia Father   . Hypertension Father   . Heart attack Father   . Heart disease Brother   . Kidney disease Daughter   . Heart disease Brother   . Cancer Son     Medications: Patient's Medications  New Prescriptions   No medications on file  Previous Medications   ALPRAZOLAM (XANAX) 0.5 MG TABLET    TAKE ONE TABLET BY MOUTH THREE TIMES DAILY AS NEEDED FOR ANXIETY OR SLEEP   ASPIRIN EC 81 MG TABLET    One daily to help prevent stroke and heart attack   BLOOD GLUCOSE CALIBRATION (OT ULTRA/FASTTK CNTRL SOLN) SOLN         CHLORPHENIRAMINE-HYDROCODONE (TUSSIONEX PENNKINETIC ER) 10-8 MG/5ML LQCR    5 cc every 12 hours to control cough   CRANBERRY PO    Take 300 mg by mouth daily.   [facility] NATURAL LUTEIN EYE HEALTH PO    Take by mouth. One tablet once daily for eyes   DESOXIMETASONE (TOPICORT) 0.25 % CREAM    Apply to lesion daily   DIGOXIN (LANOXIN) 0.125 MG TABLET    TAKE 1 TABLET BY MOUTH ON MONDAY, WEDNESDAY, FRIDAY AND SATURDAY   DIPHENHYDRAMINE-ZINC ACETATE (BENADRYL ITCH STOPPING) CREAM    Apply 1 application topically 3 (three) times daily as needed.   FUROSEMIDE (LASIX) 40 MG TABLET  One daily to control edema   GABAPENTIN (NEURONTIN) 100 MG CAPSULE    Take 2 capsules by mouth at bedtime to help tremors.   HYDROCODONE-ACETAMINOPHEN (NORCO/VICODIN) 5-325 MG TABLET    TAKE 1 TABLET BY MOUTH 4 TIMES A DAY AS NEEDED FOR PAIN.   INSULIN GLARGINE (LANTUS) 100 UNIT/ML INJECTION    Inject 35 Units into the skin at bedtime.   INSULIN LISPRO (HUMALOG KWIKPEN) 100 UNIT/ML KIWKPEN    Inject 10units before breakfast, 12 units before lunch, and 14units before supper to control sugar. May use up to 3 more units per meal as needed   INSULIN PEN NEEDLE (B-D ULTRAFINE III SHORT PEN) 31G X 8 MM MISC    Use with administering insulin. Dx. E11.29   INSULIN PEN NEEDLE 29G X 12.7MM MISC    Use as directed to administer insulin. DX: 250.40   INSULIN SYRINGE-NEEDLE U-100 (EASY COMFORT INSULIN SYRINGE) 30G X 1/2" 0.5 ML MISC    Use as Directed with insulin injections. Dx: 250.00   LANCET DEVICES (LANCING DEVICE) MISC       LEVOTHYROXINE (SYNTHROID, LEVOTHROID) 125 MCG TABLET    TAKE 1 TABLET BY MOUTH DAILY BEFORE BREAKFAST.   METOPROLOL (LOPRESSOR) 50 MG TABLET    One twice daily to control heart rate   MULTIPLE VITAMINS-MINERALS (PRESERVISION AREDS 2 PO)    Take by mouth. 1 by mouth two times daily   OMEPRAZOLE (PRILOSEC) 40 MG CAPSULE    TAKE ONE (1) CAPSULE BY MOUTH 2 TIMES DAILY FOR STOMACH   ONDANSETRON (ZOFRAN) 4 MG TABLET     TAKE 1 TABLET EVERY 4 HOURS AS NEEDED FOR NAUSEA/VOMITING   ONE TOUCH ULTRA TEST TEST STRIP    1 each by Other route 4 (four) times daily - after meals and at bedtime.    POLYETHYL GLYCOL-PROPYL GLYCOL (SYSTANE OP)    Apply to eye. One drop both eyes twice daily for dry eyes   TIZANIDINE (ZANAFLEX) 4 MG TABLET    Take one tablet by mouth three times daily as needed to relax muscles   TRIMETHOPRIM (TRIMPEX) 100 MG TABLET    TAKE ONE TABLET BY MOUTH EVERY NIGHT AT BEDTIME TO PREVENT BLADDER INFECTION.   UREA (CARMOL) 20 % CREAM    Apply daily to the callus on the heels   VITAMIN D, ERGOCALCIFEROL, (DRISDOL) 50000 UNITS CAPS CAPSULE    TAKE 1 CAPSULE BY MOUTH ONCE A WEEK   VITAMIN E PO    Take 400 Units by mouth daily.  Modified Medications   No medications on file  Discontinued Medications   CIPROFLOXACIN (CIPRO) 250 MG TABLET    Take 1 tablet (250 mg total) by mouth 2 (two) times daily.     Physical Exam:  Vitals:   09/13/16 1507  BP: 132/68  Pulse: 60  Resp: 18  Temp: 97.6 F (36.4 C)  TempSrc: Oral  SpO2: 94%  Weight: 136 lb 6.4 oz (61.9 kg)   Body mass index is 22.7 kg/m.  Physical Exam  Constitutional: She is oriented to person, place, and time. She appears well-developed and well-nourished.  HENT:  Head: Normocephalic and atraumatic.  Eyes: Conjunctivae and EOM are normal. Pupils are equal, round, and reactive to light.  Cardiovascular: Normal rate, regular rhythm and normal heart sounds.   No murmur heard. Diminished right lower dorsalis pedis pulse 1+  Pulmonary/Chest: Effort normal and breath sounds normal. No respiratory distress. She has no wheezes. She exhibits no tenderness.  Abdominal: Soft. Bowel  sounds are normal. She exhibits no distension. There is no tenderness.  Musculoskeletal: She exhibits no edema or tenderness.  Right lower extremity post popliteal tenderness with decreased ROM to knee; tenderness and swelling noted to LE. No redness. Negative spinal  point tenderness  Neurological: She is alert and oriented to person, place, and time.  Equal sensation bilateral lower extremity. Medial and lateral. Proprioception positive. Has been bearing weight with rolling walker.  Hip ROM decreased anterior, posterior, adduction, and abduction.  Straight leg raise negative.   Skin: Skin is warm and dry.  Psychiatric: She has a normal mood and affect. Her behavior is normal. Judgment and thought content normal.    Labs reviewed: Basic Metabolic Panel:  Recent Labs  01/18/16 0915 05/04/16 2132 05/11/16 0909 07/18/16 0907  NA 144 142 139 141  K 4.6 5.1 4.6 4.6  CL 99 101 101 104  CO2 25  --  27 31  GLUCOSE 76 170* 129* 100*  BUN 32* 32* 30* 27*  CREATININE 1.57* 1.20* 1.72* 1.42*  CALCIUM 9.3  --  8.9 8.8  TSH 5.770*  --  1.47 1.46   Liver Function Tests:  Recent Labs  10/12/15 0903 05/11/16 0909 07/18/16 0907  AST 21 33 25  ALT 16 29 21   ALKPHOS 97 101 97  BILITOT 0.3 0.5 0.5  PROT 6.9 6.5 6.5  ALBUMIN 4.4 3.9 4.1   No results for input(s): LIPASE, AMYLASE in the last 8760 hours. No results for input(s): AMMONIA in the last 8760 hours. CBC:  Recent Labs  05/04/16 2132  HGB 12.6  HCT 37.0   Lipid Panel:  Recent Labs  01/18/16 0915 05/11/16 0909  CHOL 182 147  HDL 31* 27*  LDLCALC 111* 92  TRIG 202* 140  CHOLHDL 5.9* 5.4*   TSH:  Recent Labs  01/18/16 0915 05/11/16 0909 07/18/16 0907  TSH 5.770* 1.47 1.46   A1C: Lab Results  Component Value Date   HGBA1C 5.8 (H) 07/18/2016     Assessment/Plan 1. Pain and swelling of lower leg, right -doppler negative for DVT, pt with generalized leg pain. Lot of exacerbating factors.  - Ambulatory referral to Orthopedics for further evaluation and treatment -not controlled with current pain regimen, for now pt with HOLD hydrocodone/apap5/325 and take:  - HYDROcodone-acetaminophen (NORCO) 10-325 MG tablet; Take 1 tablet by mouth every 6 (six) hours as needed.   Dispense: 60 tablet; Refill: 0 while pain is acutely worse -Dr Nyoka Cowden evaluated pt and agreeable to plan.   2. Acute cystitis without hematuria Has completed antibiotics, without further symptoms   [place] Mccuen K. Harle Battiest  [facility] [phone] 8 am - 5 pm) [phone] (after hours)

## 2016-09-13 NOTE — Patient Instructions (Signed)
New prescription for pain medication-- DO NOT TAKE WITH CURRENT PAIN MEDICATION

## 2016-09-14 ENCOUNTER — Telehealth: Payer: Self-pay | Admitting: *Deleted

## 2016-09-14 ENCOUNTER — Other Ambulatory Visit: Payer: Self-pay | Admitting: Internal Medicine

## 2016-09-14 ENCOUNTER — Encounter: Payer: Self-pay | Admitting: *Deleted

## 2016-09-14 DIAGNOSIS — F419 Anxiety disorder, unspecified: Secondary | ICD-10-CM

## 2016-09-14 NOTE — Telephone Encounter (Signed)
Amy with [facility] notified and agreed

## 2016-09-14 NOTE — Telephone Encounter (Signed)
Pt is supposed to HOLD hydrocodone 5/325 mg for now and to FILL 10/325 mg while she is in acute pain.  The 10/325 mg is only temporary while she is in ACUTE pain and will fall off her medication list in 2 weeks and the 5/325 mg will resume.  Does this clarify it?

## 2016-09-14 NOTE — Telephone Encounter (Signed)
Amy with [facility] called and stated that patient received a Rx yesterday for Hydrocodone 10/325 One every 6 hours as needed Patient also received a Rx last week 2/5 for Hydrocodone 5/325 One every four hours. Pharmacist wants to know which Rx is she suppose to be taking (both are in Medication list) and she wants to know what is patient to do with the Rx she just received last week. She cannot fill yesterday's Rx until she knows.  Please Advise.

## 2016-09-14 NOTE — Telephone Encounter (Signed)
error 

## 2016-09-14 NOTE — Telephone Encounter (Signed)
Patient son, [name] called and stated that patient is complaining of yeast infection. Stated that She is prone to them after taking an antibiotic and she was on one last week. States that Diflucan always clears it up. Requesting the medication to be faxed into pharmacy. Please Advise.

## 2016-09-14 NOTE — Telephone Encounter (Signed)
Rx Diflucan 100 mg (3 tabs) One daily to treat yeast infection

## 2016-09-15 ENCOUNTER — Other Ambulatory Visit: Payer: Medicare Other

## 2016-09-15 DIAGNOSIS — I10 Essential (primary) hypertension: Secondary | ICD-10-CM

## 2016-09-15 DIAGNOSIS — E1165 Type 2 diabetes mellitus with hyperglycemia: Principal | ICD-10-CM

## 2016-09-15 DIAGNOSIS — E039 Hypothyroidism, unspecified: Secondary | ICD-10-CM

## 2016-09-15 DIAGNOSIS — E785 Hyperlipidemia, unspecified: Secondary | ICD-10-CM | POA: Diagnosis not present

## 2016-09-15 DIAGNOSIS — Z794 Long term (current) use of insulin: Principal | ICD-10-CM

## 2016-09-15 DIAGNOSIS — IMO0002 Reserved for concepts with insufficient information to code with codable children: Secondary | ICD-10-CM

## 2016-09-15 DIAGNOSIS — N182 Chronic kidney disease, stage 2 (mild): Principal | ICD-10-CM

## 2016-09-15 DIAGNOSIS — E1122 Type 2 diabetes mellitus with diabetic chronic kidney disease: Secondary | ICD-10-CM | POA: Diagnosis not present

## 2016-09-15 LAB — LIPID PANEL
Cholesterol: 159 mg/dL (ref <200)
HDL: 27 mg/dL — AB (ref >50)
LDL CALC: 95 mg/dL (ref <100)
Total CHOL/HDL Ratio: 5.9 Ratio — ABNORMAL HIGH (ref <5.0)
Triglycerides: 186 mg/dL — ABNORMAL HIGH (ref <150)
VLDL: 37 mg/dL — AB (ref <30)

## 2016-09-15 LAB — COMPREHENSIVE METABOLIC PANEL
ALK PHOS: 100 U/L (ref 33–130)
ALT: 28 U/L (ref 6–29)
AST: 28 U/L (ref 10–35)
Albumin: 3.9 g/dL (ref 3.6–5.1)
BUN: 36 mg/dL — AB (ref 7–25)
CO2: 29 mmol/L (ref 20–31)
CREATININE: 1.78 mg/dL — AB (ref 0.60–0.88)
Calcium: 9 mg/dL (ref 8.6–10.4)
Chloride: 100 mmol/L (ref 98–110)
Glucose, Bld: 94 mg/dL (ref 65–99)
Potassium: 4.8 mmol/L (ref 3.5–5.3)
SODIUM: 138 mmol/L (ref 135–146)
TOTAL PROTEIN: 6.7 g/dL (ref 6.1–8.1)
Total Bilirubin: 0.4 mg/dL (ref 0.2–1.2)

## 2016-09-15 LAB — TSH: TSH: 4.27 mIU/L

## 2016-09-15 MED ORDER — FLUCONAZOLE 100 MG PO TABS: ORAL_TABLET | ORAL | 0 refills | Status: DC

## 2016-09-15 NOTE — Telephone Encounter (Signed)
Patient son, [name] notified and agreed. Rx faxed to pharmacy.

## 2016-09-16 LAB — HEMOGLOBIN A1C
HEMOGLOBIN A1C: 6.6 % — AB (ref <5.7)
Mean Plasma Glucose: 143 mg/dL

## 2016-09-16 LAB — MICROALBUMIN, URINE

## 2016-09-19 DIAGNOSIS — M170 Bilateral primary osteoarthritis of knee: Secondary | ICD-10-CM | POA: Diagnosis not present

## 2016-09-20 ENCOUNTER — Encounter: Payer: Self-pay | Admitting: Internal Medicine

## 2016-09-20 ENCOUNTER — Other Ambulatory Visit: Payer: Self-pay | Admitting: Internal Medicine

## 2016-09-20 ENCOUNTER — Ambulatory Visit (INDEPENDENT_AMBULATORY_CARE_PROVIDER_SITE_OTHER): Payer: Medicare Other | Admitting: Internal Medicine

## 2016-09-20 VITALS — BP 150/52 | HR 57 | Temp 97.8°F | Resp 16 | Ht 65.0 in | Wt 135.0 lb

## 2016-09-20 DIAGNOSIS — N183 Chronic kidney disease, stage 3 unspecified: Secondary | ICD-10-CM

## 2016-09-20 DIAGNOSIS — R079 Chest pain, unspecified: Secondary | ICD-10-CM

## 2016-09-20 DIAGNOSIS — Z794 Long term (current) use of insulin: Secondary | ICD-10-CM | POA: Diagnosis not present

## 2016-09-20 DIAGNOSIS — K219 Gastro-esophageal reflux disease without esophagitis: Secondary | ICD-10-CM | POA: Diagnosis not present

## 2016-09-20 DIAGNOSIS — E1165 Type 2 diabetes mellitus with hyperglycemia: Secondary | ICD-10-CM | POA: Diagnosis not present

## 2016-09-20 DIAGNOSIS — I10 Essential (primary) hypertension: Secondary | ICD-10-CM | POA: Diagnosis not present

## 2016-09-20 DIAGNOSIS — E785 Hyperlipidemia, unspecified: Secondary | ICD-10-CM | POA: Diagnosis not present

## 2016-09-20 DIAGNOSIS — N182 Chronic kidney disease, stage 2 (mild): Secondary | ICD-10-CM

## 2016-09-20 DIAGNOSIS — E039 Hypothyroidism, unspecified: Secondary | ICD-10-CM | POA: Diagnosis not present

## 2016-09-20 DIAGNOSIS — IMO0002 Reserved for concepts with insufficient information to code with codable children: Secondary | ICD-10-CM

## 2016-09-20 DIAGNOSIS — M25561 Pain in right knee: Secondary | ICD-10-CM

## 2016-09-20 DIAGNOSIS — E1122 Type 2 diabetes mellitus with diabetic chronic kidney disease: Secondary | ICD-10-CM

## 2016-09-20 DIAGNOSIS — R0602 Shortness of breath: Secondary | ICD-10-CM | POA: Diagnosis not present

## 2016-09-20 MED ORDER — NITROGLYCERIN 0.4 MG SL SUBL: 0.4000 mg | SUBLINGUAL_TABLET | SUBLINGUAL | 12 refills | Status: AC | PRN

## 2016-09-20 NOTE — Progress Notes (Signed)
Facility  [place]    Place of Service:   OFFICE    Allergies  Allergen Reactions  . Celebrex [Celecoxib] Rash  . Doxycycline Rash  . Lyrica [Pregabalin] Rash  . Avandia [Rosiglitazone]   . Macrodantin [Nitrofurantoin Macrocrystal]   . Sulfa Antibiotics   . Vioxx [Rofecoxib]   . Silicone Rash    Gets rash from the touch it.    Chief Complaint  Patient presents with  . Medical Management of Chronic Issues    two month follow- up, discuss labs   . Medication Refill    HPI:  Follow up visit from 07/20/16. Reduced Lantus to 35 u hs. She no longer runs a low CBG, but the CBG is running high, so she went back on 42 u Lantus hs and 07-15-15 before B-L-S  Started gabapentin for tremor and I hoped it might help pain in ankle and foot.  Has been seen 3 times by NP since that visit: 09/05/16 and 09/13/16 for right knee pain. Was given increased strength of Vicodin last week and referred to Ortho. Saw orh\tho yesterday and received injection of steroid in the knee. It is feeling somewhat better. Legs feel better. Has 6 week follow up with ortho.  Has had elevation in the glucose. Believes the steroid injection makes diabetic control worse.  Has had central chest discomfort that she thins is related to esophageal spasm. Occur after meals and in the mornings sometimes after she gets up Last several minutes. Sometimes is dyspneic. Has diaphoretic episodes and feels like she might black out. CBG is never low.   Medications: Patient's Medications  New Prescriptions   No medications on file  Previous Medications   ALPRAZOLAM (XANAX) 0.5 MG TABLET    TAKE ONE TABLET BY MOUTH THREE TIMES DAILY AS NEEDED FOR ANXIETY OR SLEEP   ASPIRIN 325 MG EC TABLET    Take 325 mg by mouth daily.   BLOOD GLUCOSE CALIBRATION (OT ULTRA/FASTTK CNTRL SOLN) SOLN       CHLORPHENIRAMINE-HYDROCODONE (TUSSIONEX PENNKINETIC ER) 10-8 MG/5ML LQCR    5 cc every 12 hours to control cough   CRANBERRY PO    Take 300 mg by  mouth daily.   [facility] NATURAL LUTEIN EYE HEALTH PO    Take by mouth. One tablet once daily for eyes   DESOXIMETASONE (TOPICORT) 0.25 % CREAM    Apply to lesion daily   DIGOXIN (LANOXIN) 0.125 MG TABLET    TAKE 1 TABLET BY MOUTH ON MONDAY, WEDNESDAY, FRIDAY AND SATURDAY   DIPHENHYDRAMINE-ZINC ACETATE (BENADRYL ITCH STOPPING) CREAM    Apply 1 application topically 3 (three) times daily as needed.   FLUCONAZOLE (DIFLUCAN) 100 MG TABLET    Take one tablet by mouth daily to treat yeast infection   FUROSEMIDE (LASIX) 40 MG TABLET    One daily to control edema   GABAPENTIN (NEURONTIN) 100 MG CAPSULE    Take 2 capsules by mouth at bedtime to help tremors.   HYDROCODONE-ACETAMINOPHEN (NORCO) 10-325 MG TABLET    Take 1 tablet by mouth every 6 (six) hours as needed.   INSULIN GLARGINE (LANTUS) 100 UNIT/ML INJECTION    Inject 35 Units into the skin at bedtime.   INSULIN LISPRO (HUMALOG KWIKPEN) 100 UNIT/ML KIWKPEN    Inject 10units before breakfast, 12 units before lunch, and 14units before supper to control sugar. May use up to 3 more units per meal as needed   INSULIN PEN NEEDLE (B-D ULTRAFINE III SHORT PEN) 31G X  8 MM MISC    Use with administering insulin. Dx. E11.29   INSULIN PEN NEEDLE 29G X 12.7MM MISC    Use as directed to administer insulin. DX: 250.40   INSULIN SYRINGE-NEEDLE U-100 (EASY COMFORT INSULIN SYRINGE) 30G X 1/2" 0.5 ML MISC    Use as Directed with insulin injections. Dx: 250.00   LANCET DEVICES (LANCING DEVICE) MISC       LEVOTHYROXINE (SYNTHROID, LEVOTHROID) 125 MCG TABLET    TAKE 1 TABLET BY MOUTH DAILY BEFORE BREAKFAST.   METHYLPREDNISOLONE (MEDROL DOSEPAK) 4 MG TBPK TABLET    Patient was given rx on 09/19/16 and will start when injections wear off   METOPROLOL (LOPRESSOR) 50 MG TABLET    One twice daily to control heart rate   MULTIPLE VITAMINS-MINERALS (PRESERVISION AREDS 2 PO)    Take by mouth. 1 by mouth two times daily   OMEPRAZOLE (PRILOSEC) 40 MG CAPSULE    TAKE ONE (1) CAPSULE BY  MOUTH 2 TIMES DAILY FOR STOMACH   ONDANSETRON (ZOFRAN) 4 MG TABLET    TAKE 1 TABLET EVERY 4 HOURS AS NEEDED FOR NAUSEA/VOMITING   ONE TOUCH ULTRA TEST TEST STRIP    1 each by Other route 4 (four) times daily - after meals and at bedtime.    POLYETHYL GLYCOL-PROPYL GLYCOL (SYSTANE OP)    Apply to eye. One drop both eyes twice daily for dry eyes   TIZANIDINE (ZANAFLEX) 4 MG TABLET    Take one tablet by mouth three times daily as needed to relax muscles   TRIMETHOPRIM (TRIMPEX) 100 MG TABLET    TAKE ONE TABLET BY MOUTH EVERY NIGHT AT BEDTIME TO PREVENT BLADDER INFECTION.   UREA (CARMOL) 20 % CREAM    Apply daily to the callus on the heels   VITAMIN D, ERGOCALCIFEROL, (DRISDOL) 50000 UNITS CAPS CAPSULE    TAKE 1 CAPSULE BY MOUTH ONCE A WEEK   VITAMIN E PO    Take 400 Units by mouth daily.  Modified Medications   No medications on file  Discontinued Medications   ASPIRIN EC 81 MG TABLET    One daily to help prevent stroke and heart attack   HYDROCODONE-ACETAMINOPHEN (NORCO/VICODIN) 5-325 MG TABLET    TAKE 1 TABLET BY MOUTH 4 TIMES A DAY AS NEEDED FOR PAIN.    Review of Systems  Constitutional: Positive for activity change. Negative for appetite change, chills, diaphoresis, fatigue, fever and unexpected weight change.  HENT: Negative for congestion, sinus pain and sinus pressure.   Eyes: Negative.   Respiratory: Negative for cough, chest tightness, shortness of breath and wheezing.   Cardiovascular: Positive for leg swelling. Negative for chest pain and palpitations.       Right lower leg edema   Gastrointestinal: Negative for abdominal distention, abdominal pain, constipation, diarrhea, nausea and vomiting.       Spastic colon. Intermittent diarrhea. Has more problems with chronic constipation. Mild dysphagia.  Endocrine: Negative for polydipsia, polyphagia and polyuria.       Diabetic. History of thyroid problems. Thyroid has been removed. She is on supplements.  Genitourinary: Negative for  difficulty urinating, enuresis, frequency and urgency.       Urinary leakage and incontinence. Nocturia. Denies dysuria.  Musculoskeletal: Positive for arthralgias, back pain, gait problem, joint swelling and myalgias.       Complaints of muscular weakness. Chronic backache. Arthritis in other joints. Unsteady gait. Bilateral lower leg discomfort. Bilateral knee pains Enlarged end of the right clavicle at the manubrium. Pain in  the right shoulder . Difficulty raising her arm. History of degenerative changes on Xray Dec. 2015. Left knee "gave out" and caused her to fall 05/04/16. Was seen at ED. tricompartment OA on xrays.  Skin: Negative for color change.       Seborrheic keratosis of the left breast. Generalized itching without rash. Ingrown right great toenail.  Neurological: Negative for dizziness, tremors, seizures, weakness, light-headedness, numbness and headaches.       Tingling in right lower extremity  Hematological: Negative.   Psychiatric/Behavioral: Negative.        Chronic anxiety. Sleeps okay.    Vitals:   09/20/16 1520  BP: (!) 150/52  Pulse: (!) 57  Resp: 16  Temp: 97.8 F (36.6 C)  TempSrc: Oral  SpO2: 97%  Weight: 135 lb (61.2 kg)  Height: _0  (1.651 m)   Body mass index is 22.47 kg/m. Wt Readings from Last 3 Encounters:  09/20/16 135 lb (61.2 kg)  09/13/16 136 lb 6.4 oz (61.9 kg)  09/08/16 139 lb 6.4 oz (63.2 kg)      Physical Exam  Constitutional: She is oriented to person, place, and time. She appears well-developed and well-nourished.  HENT:  Head: Normocephalic and atraumatic.  Eyes: Conjunctivae and EOM are normal. Pupils are equal, round, and reactive to light.  Cardiovascular: Normal rate, regular rhythm and normal heart sounds.   No murmur heard. Diminished right lower dorsalis pedis pulse 1+  Pulmonary/Chest: Effort normal and breath sounds normal. No respiratory distress. She has no wheezes. She exhibits no tenderness.  Abdominal: Soft.  Bowel sounds are normal. She exhibits no distension. There is no tenderness.  Musculoskeletal: She exhibits no edema or tenderness.  Right lower extremity post popliteal tenderness with decreased ROM to knee; tenderness and swelling noted to LE. No redness. Negative spinal point tenderness  Neurological: She is alert and oriented to person, place, and time.  Equal sensation bilateral lower extremity. Medial and lateral. Proprioception positive. Has been bearing weight with rolling walker.  Hip ROM decreased anterior, posterior, adduction, and abduction.  Straight leg raise negative.   Skin: Skin is warm and dry.  Psychiatric: She has a normal mood and affect. Her behavior is normal. Judgment and thought content normal.    Labs reviewed: Lab Summary Latest Ref Rng & Units 09/15/2016 07/18/2016  Hemoglobin 13.0-17.0 g/dL (None) (None)  Hematocrit 39.0-52.0 % (None) (None)  White count - (None) (None)  Platelet count - (None) (None)  Sodium 135 - 146 mmol/L 138 141  Potassium 3.5 - 5.3 mmol/L 4.8 4.6  Calcium 8.6 - 10.4 mg/dL 9.0 8.8  Phosphorus - (None) (None)  Creatinine 0.60 - 0.88 mg/dL 1.78(H) 1.42(H)  AST 10 - 35 U/L 28 25  Alk Phos 33 - 130 U/L 100 97  Bilirubin 0.2 - 1.2 mg/dL 0.4 0.5  Glucose 65 - 99 mg/dL 94 100(H)  Cholesterol <200 mg/dL 159 (None)  HDL cholesterol >50 mg/dL 27(L) (None)  Triglycerides <150 mg/dL 186(H) (None)  LDL Direct - (None) (None)  LDL Calc <100 mg/dL 95 (None)  Total protein 6.1 - 8.1 g/dL 6.7 6.5  Albumin 3.6 - 5.1 g/dL 3.9 4.1  Some recent data might be hidden   Lab Results  Component Value Date   TSH 4.27 09/15/2016   TSH 1.46 07/18/2016   TSH 1.47 05/11/2016   Lab Results  Component Value Date   BUN 36 (H) 09/15/2016   BUN 27 (H) 07/18/2016   BUN 30 (H) 05/11/2016   Lab Results  Component Value Date   HGBA1C 6.6 (H) 09/15/2016   HGBA1C 5.8 (H) 07/18/2016   HGBA1C 6.2 (H) 05/11/2016    Assessment/Plan  1. Uncontrolled type 2  diabetes mellitus with stage 2 chronic kidney disease, with long-term current use of insulin (HCC) Insulin dosage adjusted. Advised her to give extra Humalog if glucose is >400 at bedtime. - Hemoglobin A1c; Future - Comprehensive metabolic panel; Future  2. Essential hypertension Monitor - Comprehensive metabolic panel; Future  3. Gastroesophageal reflux disease, esophagitis presence not specified -chest pain s may relate to esophageal spasm  4. Chronic kidney disease, stage III (moderate) Mild rise in BUN and creatinine.  5. Shortness of breath iobserve  6. Right knee pain, unspecified chronicity Improved since her steroid injection. Continue to see orthto as scheduled  7. Chest pain, unspecified type ? Esophageal spasm vs. cardiac - nitroGLYCERIN (NITROSTAT) 0.4 MG SL tablet; Place 1 tablet (0.4 mg total) under the tongue every 5 (five) minutes as needed for chest pain.  Dispense: 25 tablet; Refill: 12  8. Hypothyroidism, unspecified type The current medical regimen is effective;  continue present plan and medications. - TSH; Future

## 2016-09-21 LAB — MICROALBUMIN, URINE: Microalb, Ur: 1 mg/dL

## 2016-09-21 MED ORDER — INSULIN LISPRO 100 UNIT/ML (KWIKPEN): PEN_INJECTOR | SUBCUTANEOUS | 5 refills | Status: DC

## 2016-09-28 ENCOUNTER — Telehealth: Payer: Self-pay | Admitting: *Deleted

## 2016-09-28 NOTE — Telephone Encounter (Signed)
Karen [name],Dodd called and stated that he has concerns with his mother's Mobility, Arthritis and Driving. Stated his sister and himself are concerned with mother driving. How can this be evaluated? He rode with her the other day and she served a couple of times towards the middle lane but never crossed it. She doesn't want to give up driving. Karen [name] is worried with her heart conditions, Arthritis and reflexes. Just worried if someone pulled out in front of her or a kid ran out in front of her, not sure her reflexes are good enough to stop. Karen [name] just wants another opinion. Please Advise.

## 2016-09-29 ENCOUNTER — Telehealth: Payer: Self-pay

## 2016-09-29 NOTE — Telephone Encounter (Signed)
Spoke with patient to verify that patient requested supplies from [facility]. Patient stated that she did send that request.

## 2016-09-29 NOTE — Telephone Encounter (Signed)
Fax prescription for supplies to pharmacy

## 2016-10-03 ENCOUNTER — Other Ambulatory Visit: Payer: Self-pay | Admitting: *Deleted

## 2016-10-03 ENCOUNTER — Other Ambulatory Visit: Payer: Self-pay | Admitting: Internal Medicine

## 2016-10-03 NOTE — Telephone Encounter (Signed)
Spoke with patient, patient did see the specialist and the only thing they did was examine her and tell her to follow-up in a few months. Patient aware I will request OV notes from Dr.Landau    I called Dr.Landau's office and requested OV notes, awaiting fax

## 2016-10-03 NOTE — Telephone Encounter (Deleted)
Patient requested refill. Finished her 2 weeks of 10/325.

## 2016-10-03 NOTE — Telephone Encounter (Signed)
Called patient, no answer. Requested medication is not on medication liast. Patient was given rx for Hydrocodone on 09/13/16 and was to follow-up with Ortho

## 2016-10-13 ENCOUNTER — Telehealth: Payer: Self-pay

## 2016-10-13 ENCOUNTER — Other Ambulatory Visit: Payer: Self-pay | Admitting: Internal Medicine

## 2016-10-13 DIAGNOSIS — F419 Anxiety disorder, unspecified: Secondary | ICD-10-CM

## 2016-10-13 NOTE — Telephone Encounter (Signed)
Called Karen [name] to ask her if she was familiar with [facility]. She said she was and wanted to order her diabetic supplies through them. Patient also stated she does not receive medical supplies from [facility].   Form completed and faxed.

## 2016-10-14 ENCOUNTER — Telehealth: Payer: Self-pay | Admitting: *Deleted

## 2016-10-14 NOTE — Telephone Encounter (Signed)
Patient dropped off [employer] Patient Assistance Form for Lantus Solostar [phone]. Filled out and placed in Dr. Rolly Salter folder to review and sign. To be faxed back to [facility] Fax#: [phone]

## 2016-10-19 NOTE — Telephone Encounter (Signed)
Patient Assistance Form from [facility] for Lantus faxed. Patient Aware.

## 2016-10-27 ENCOUNTER — Telehealth: Payer: Self-pay

## 2016-10-27 NOTE — Telephone Encounter (Signed)
Left message for patient to return call to office.   Reason for call: To inform them of letter of denial for patients assistance.

## 2016-10-27 NOTE — Telephone Encounter (Signed)
Opened in error

## 2016-10-27 NOTE — Telephone Encounter (Signed)
I am not sure if there is any cheaper long acting insulin for her. Sometimes Basaglar is less expensive. It could be substitute for Lantus in equal units.

## 2016-10-27 NOTE — Telephone Encounter (Signed)
Spoke with patient she stated that she was eligible for patient assistance last year and does not understand why she wasn't this year.   Patient said is there another medication she can take because the lantus is to expensive. Please advise

## 2016-10-31 ENCOUNTER — Other Ambulatory Visit: Payer: Self-pay

## 2016-10-31 DIAGNOSIS — M170 Bilateral primary osteoarthritis of knee: Secondary | ICD-10-CM | POA: Diagnosis not present

## 2016-10-31 MED ORDER — INSULIN GLARGINE 100 UNIT/ML ~~LOC~~ SOLN: 42.0000 [IU] | Freq: Every day | SUBCUTANEOUS | 0 refills | Status: DC

## 2016-10-31 MED ORDER — BASAGLAR KWIKPEN 100 UNIT/ML ~~LOC~~ SOPN: 42.0000 [IU] | PEN_INJECTOR | Freq: Every day | SUBCUTANEOUS | 0 refills | Status: DC

## 2016-10-31 NOTE — Telephone Encounter (Signed)
Spoke with Karen [name] [facility]) he expressed understanding and that he wanted [place] to send in both meds so he could see which med is cheaper.   I also let him know that we would need him to call us back to let us know which meds she decided to take.

## 2016-10-31 NOTE — Addendum Note (Signed)
Addended by: Moshe Cipro, MESHELL A on: 10/31/2016 11:16 AM   Modules accepted: Orders

## 2016-11-04 ENCOUNTER — Other Ambulatory Visit: Payer: Self-pay | Admitting: Nurse Practitioner

## 2016-11-04 NOTE — Telephone Encounter (Signed)
Spoke with Herbie Baltimore to verify dose and instructions of patient's Hydrocodone

## 2016-11-04 NOTE — Telephone Encounter (Signed)
Spoke to [place] he stated that they will not be able to find out which medication is cheaper until April 12 because that's the day she can get a refill.

## 2016-11-07 ENCOUNTER — Other Ambulatory Visit: Payer: Self-pay | Admitting: Internal Medicine

## 2016-11-09 ENCOUNTER — Other Ambulatory Visit: Payer: Self-pay | Admitting: Internal Medicine

## 2016-11-09 DIAGNOSIS — F419 Anxiety disorder, unspecified: Secondary | ICD-10-CM

## 2016-11-22 ENCOUNTER — Encounter: Payer: Self-pay | Admitting: Internal Medicine

## 2016-11-28 ENCOUNTER — Other Ambulatory Visit: Payer: Self-pay | Admitting: Internal Medicine

## 2016-11-30 ENCOUNTER — Other Ambulatory Visit: Payer: Self-pay | Admitting: *Deleted

## 2016-11-30 ENCOUNTER — Other Ambulatory Visit: Payer: Self-pay | Admitting: Internal Medicine

## 2016-11-30 DIAGNOSIS — E039 Hypothyroidism, unspecified: Secondary | ICD-10-CM

## 2016-11-30 MED ORDER — HYDROCODONE-ACETAMINOPHEN 5-325 MG PO TABS: ORAL_TABLET | ORAL | 0 refills | Status: DC

## 2016-11-30 NOTE — Telephone Encounter (Signed)
Patient requested. Printed for Dr. Nyoka Cowden to sign. Patient to pick up on Friday.

## 2016-12-01 ENCOUNTER — Other Ambulatory Visit: Payer: Self-pay | Admitting: Internal Medicine

## 2016-12-01 DIAGNOSIS — R000 Tachycardia, unspecified: Secondary | ICD-10-CM

## 2016-12-01 DIAGNOSIS — H26492 Other secondary cataract, left eye: Secondary | ICD-10-CM | POA: Diagnosis not present

## 2016-12-01 DIAGNOSIS — H353132 Nonexudative age-related macular degeneration, bilateral, intermediate dry stage: Secondary | ICD-10-CM | POA: Diagnosis not present

## 2016-12-01 DIAGNOSIS — E119 Type 2 diabetes mellitus without complications: Secondary | ICD-10-CM | POA: Diagnosis not present

## 2016-12-01 DIAGNOSIS — H524 Presbyopia: Secondary | ICD-10-CM | POA: Diagnosis not present

## 2016-12-01 DIAGNOSIS — H04123 Dry eye syndrome of bilateral lacrimal glands: Secondary | ICD-10-CM | POA: Diagnosis not present

## 2016-12-05 ENCOUNTER — Other Ambulatory Visit: Payer: Self-pay | Admitting: Internal Medicine

## 2016-12-05 DIAGNOSIS — IMO0002 Reserved for concepts with insufficient information to code with codable children: Secondary | ICD-10-CM

## 2016-12-05 DIAGNOSIS — N182 Chronic kidney disease, stage 2 (mild): Principal | ICD-10-CM

## 2016-12-05 DIAGNOSIS — E1122 Type 2 diabetes mellitus with diabetic chronic kidney disease: Secondary | ICD-10-CM

## 2016-12-05 DIAGNOSIS — E1165 Type 2 diabetes mellitus with hyperglycemia: Principal | ICD-10-CM

## 2016-12-05 DIAGNOSIS — Z794 Long term (current) use of insulin: Principal | ICD-10-CM

## 2016-12-08 ENCOUNTER — Other Ambulatory Visit: Payer: Self-pay | Admitting: Internal Medicine

## 2016-12-08 DIAGNOSIS — F419 Anxiety disorder, unspecified: Secondary | ICD-10-CM

## 2016-12-13 NOTE — Telephone Encounter (Signed)
Attempted to call Karen [name] the call was answered but then ended. Will attempt at a later time.

## 2016-12-19 ENCOUNTER — Other Ambulatory Visit: Payer: Medicare Other

## 2016-12-19 DIAGNOSIS — N182 Chronic kidney disease, stage 2 (mild): Secondary | ICD-10-CM | POA: Diagnosis not present

## 2016-12-19 DIAGNOSIS — I10 Essential (primary) hypertension: Secondary | ICD-10-CM | POA: Diagnosis not present

## 2016-12-19 DIAGNOSIS — E039 Hypothyroidism, unspecified: Secondary | ICD-10-CM | POA: Diagnosis not present

## 2016-12-19 DIAGNOSIS — E1122 Type 2 diabetes mellitus with diabetic chronic kidney disease: Secondary | ICD-10-CM | POA: Diagnosis not present

## 2016-12-19 DIAGNOSIS — Z794 Long term (current) use of insulin: Principal | ICD-10-CM

## 2016-12-19 DIAGNOSIS — E1165 Type 2 diabetes mellitus with hyperglycemia: Principal | ICD-10-CM

## 2016-12-19 DIAGNOSIS — IMO0002 Reserved for concepts with insufficient information to code with codable children: Secondary | ICD-10-CM

## 2016-12-20 LAB — COMPREHENSIVE METABOLIC PANEL
ALK PHOS: 115 U/L (ref 33–130)
ALT: 18 U/L (ref 6–29)
AST: 17 U/L (ref 10–35)
Albumin: 4 g/dL (ref 3.6–5.1)
BILIRUBIN TOTAL: 0.6 mg/dL (ref 0.2–1.2)
BUN: 26 mg/dL — AB (ref 7–25)
CO2: 26 mmol/L (ref 20–31)
CREATININE: 1.43 mg/dL — AB (ref 0.60–0.88)
Calcium: 8.8 mg/dL (ref 8.6–10.4)
Chloride: 104 mmol/L (ref 98–110)
GLUCOSE: 187 mg/dL — AB (ref 65–99)
POTASSIUM: 4.9 mmol/L (ref 3.5–5.3)
Sodium: 141 mmol/L (ref 135–146)
TOTAL PROTEIN: 6.5 g/dL (ref 6.1–8.1)

## 2016-12-20 LAB — TSH: TSH: 1.48 m[IU]/L

## 2016-12-20 LAB — HEMOGLOBIN A1C
HEMOGLOBIN A1C: 6.5 % — AB (ref <5.7)
MEAN PLASMA GLUCOSE: 140 mg/dL

## 2016-12-22 ENCOUNTER — Ambulatory Visit (INDEPENDENT_AMBULATORY_CARE_PROVIDER_SITE_OTHER): Payer: Medicare Other | Admitting: Internal Medicine

## 2016-12-22 ENCOUNTER — Encounter: Payer: Self-pay | Admitting: Internal Medicine

## 2016-12-22 VITALS — BP 128/60 | HR 58 | Temp 98.2°F | Wt 140.0 lb

## 2016-12-22 DIAGNOSIS — K219 Gastro-esophageal reflux disease without esophagitis: Secondary | ICD-10-CM | POA: Diagnosis not present

## 2016-12-22 DIAGNOSIS — R296 Repeated falls: Secondary | ICD-10-CM | POA: Diagnosis not present

## 2016-12-22 DIAGNOSIS — E1122 Type 2 diabetes mellitus with diabetic chronic kidney disease: Secondary | ICD-10-CM | POA: Diagnosis not present

## 2016-12-22 DIAGNOSIS — E039 Hypothyroidism, unspecified: Secondary | ICD-10-CM | POA: Diagnosis not present

## 2016-12-22 DIAGNOSIS — Z794 Long term (current) use of insulin: Secondary | ICD-10-CM

## 2016-12-22 DIAGNOSIS — M170 Bilateral primary osteoarthritis of knee: Secondary | ICD-10-CM

## 2016-12-22 DIAGNOSIS — N183 Chronic kidney disease, stage 3 unspecified: Secondary | ICD-10-CM

## 2016-12-22 NOTE — Progress Notes (Signed)
Location:  [facility] clinic Provider:  Jovane Foutz L. Mariea Clonts, D.O., C.M.D. Previous PCP:  Dr. Nyoka Cowden  Goals of Care:  Advanced Directives 12/22/2016  Does Patient Have a Medical Advance Directive? Yes  Type of Paramedic of [place];Living will  Does patient want to make changes to medical advance directive? -  Copy of [place] in Chart? No - copy requested  Would patient like information on creating a medical advance directive? -   Chief Complaint  Patient presents with  . Medical Management of Chronic Issues    32mth follow-up, fell 5 days ago, check right shoulder, feet swelling    HPI: Patient is a 83 y.o. female seen today for medical management of chronic diseases.  She lives alone and her daughter and brother live far away and the one who lives closest has his own business.    She fell 5 days ago and her right shoulder hurts.  She turned to flip and twisted her body and it may just be strained.  She fell at 330 am and then laid on the floor for 3 hrs.  Did not use her life alert button.  Does not want to go to the ER via ambulance.  Had turned in sandals around and missed her walker, then fell.  Sugar has been normal when she's fallen.    Knees are hurting but not due for injection with Dr. Mardelle Matte.    Balance is much worse.  Neuropathy getting worse also.  Feet are painful.    DMII:  Once in a while has a low--like one day last week.  One other time since.  49 and 47.  She does eat her three regular meals and a snack in between.  On insulin for 13-14 years.  Had been on a pill for 12 years.  Initially diagnosed in 1984.  She weighed only 104 lbs then.  She had a ton of stress at that time in her life.   Renal function was improved to 1.43 from 1.78.  Checks sugars am, before dinner and at hs unless she has a low.  Hypothyroidism:  tsh 1.48.  On levothyroxine which was changed about a year ago to the 142mcg.    Gabapentin is helping her sleep and  not be bothered by tremors.  It did make her more off balance.    Has not been using the tizanidine since going on the gabapentin.  Has only used 1/2 tab instead--none in past week.  Has fallen 5x since January.  Reviewed meds increasing risk:  Insulin, zanaflex, alprazolam (says she does not think she can come off of it), hydrocodone qid prn--takes faithfully on this schedule (takes the evening one in half), gabapentin, lasix, digoxin.    Does add salt to her cooking, but does not resalt.   Past Medical History:  Diagnosis Date  . A-fib ([place])   . Abnormal involuntary movements(781.0)   . Anemia   . Anxiety   . Anxiety state, unspecified   . Arthritis    body, neck  . Benign hypertensive heart disease   . Benign hypertensive heart disease with congestive heart failure ([place])   . Bladder disorder    over active  . Cancer ([place])    skin, removed  . Cervicalgia   . CHF (congestive heart failure) ([place])   . Chronic kidney disease, stage III (moderate)   . Congestive heart failure, unspecified   . Contact dermatitis and other eczema, due to unspecified  cause   . Corns and callosities   . Diarrhea   . Dyspnea 02/18/2015  . Edema   . Esophageal stricture   . Gastroparesis   . Gastroparesis   . GERD (gastroesophageal reflux disease)   . Hiatal hernia   . Hyperlipidemia   . Inflamed seborrheic keratosis   . Inflammatory disease of breast   . Irregular heartbeat   . Lumbago   . Mixed incontinence urge and stress (female)(female)   . Nausea alone   . Nonspecific (abnormal) findings on radiological and other examination of skull and head   . Osteoarthrosis, unspecified whether generalized or localized, unspecified site   . Other and unspecified hyperlipidemia   . Other B-complex deficiencies   . Other functional disorder of bladder   . Other malaise and fatigue   . Other specified visual disturbances   . Pain in joint, lower leg   . Palpitations   . Peripheral vascular disease,  unspecified ([place])   . Postmenopausal atrophic vaginitis   . Reflux esophagitis   . Seborrheic keratosis   . Sleep apnea   . Spasm of muscle   . Spinal stenosis   . Spinal stenosis, unspecified region other than cervical   . Stricture and stenosis of esophagus   . Stroke ([place])   . TIA (transient ischemic attack) 2012  . Transient ischemic attack (TIA), and cerebral infarction without residual deficits(V12.54)   . Type II or unspecified type diabetes mellitus with renal manifestations, not stated as uncontrolled(250.40)   . Unspecified constipation   . Unspecified essential hypertension   . Unspecified hereditary and idiopathic peripheral neuropathy   . Unspecified hypothyroidism   . Unspecified pruritic disorder   . Unspecified sleep apnea   . Unspecified vitamin D deficiency   . Urinary tract infection, site not specified   . Vitamin B12 deficiency     Past Surgical History:  Procedure Laterality Date  . ABDOMINAL HYSTERECTOMY    . BACK SURGERY     x 2  . CHOLECYSTECTOMY  1991  . esophageal  2004,2006   dilation, Dr Lyla Son  . [place]   bilateral cataract  . hammer toes    . HERNIA REPAIR  1984aaaaaaaa  . SKIN CANCER DESTRUCTION    . THYROIDECTOMY, PARTIAL  1965    Allergies  Allergen Reactions  . Celebrex [Celecoxib] Rash  . Doxycycline Rash  . Lyrica [Pregabalin] Rash  . Avandia [Rosiglitazone]   . Macrodantin [Nitrofurantoin Macrocrystal]   . Sulfa Antibiotics   . Vioxx [Rofecoxib]   . Silicone Rash    Gets rash from the touch it.    Allergies as of 12/22/2016      Reactions   Celebrex [celecoxib] Rash   Doxycycline Rash   Lyrica [pregabalin] Rash   Avandia [rosiglitazone]    Macrodantin [nitrofurantoin Macrocrystal]    Sulfa Antibiotics    Vioxx [rofecoxib]    Silicone Rash   Gets rash from the touch it.      Medication List       Accurate as of 12/22/16  1:31 PM. Always use your most recent med list.          ALPRAZolam  0.5 MG tablet Commonly known as:  XANAX TAKE ONE TABLET BY MOUTH THREE TIMES DAILY AS NEEDED FOR ANXIETY OR SLEEP   aspirin 325 MG EC tablet Take 325 mg by mouth daily.   BENADRYL ITCH STOPPING cream Generic drug:  diphenhydrAMINE-zinc acetate Apply 1 application topically 3 (  three) times daily as needed.   CRANBERRY PO Take 300 mg by mouth daily.   [facility] NATURAL LUTEIN EYE HEALTH PO Take by mouth. One tablet once daily for eyes   digoxin 0.125 MG tablet Commonly known as:  LANOXIN TAKE 1 TABLET BY MOUTH ON MONDAY, WEDNESDAY, FRIDAY AND SATURDAY   furosemide 40 MG tablet Commonly known as:  LASIX One daily to control edema   gabapentin 100 MG capsule Commonly known as:  NEURONTIN Take 2 capsules by mouth at bedtime to help tremors.   HUMALOG KWIKPEN 100 UNIT/ML KiwkPen Generic drug:  insulin lispro INJECT 10 UNITS BEFORE BREAKFAST, 12 UNITS BEFORE LUNCH, AND 14 UNITSBEFORE SUPPER TO CONTROL SUGAR. MAY USE UP TO 3 MORE UNITS PER MEAL AS   HYDROcodone-acetaminophen 5-325 MG tablet Commonly known as:  NORCO/VICODIN Take one tablet by mouth four times daily as needed for pain   insulin glargine 100 UNIT/ML injection Commonly known as:  LANTUS Inject 0.42 mLs (42 Units total) into the skin at bedtime.   Insulin Pen Needle 31G X 8 MM Misc Commonly known as:  B-D ULTRAFINE III SHORT PEN Use with administering insulin. Dx. E11.29   levothyroxine 125 MCG tablet Commonly known as:  SYNTHROID, LEVOTHROID TAKE 1 TABLET BY MOUTH EVERY MORNING 30 MINUTES BEFORE BREAKFAST.   metoprolol tartrate 50 MG tablet Commonly known as:  LOPRESSOR TAKE 1 TABLET BY MOUTH TWICE A DAY TO CONTROL HEART RATE   nitroGLYCERIN 0.4 MG SL tablet Commonly known as:  NITROSTAT Place 1 tablet (0.4 mg total) under the tongue every 5 (five) minutes as needed for chest pain.   omeprazole 40 MG capsule Commonly known as:  PRILOSEC TAKE ONE (1) CAPSULE BY MOUTH 2 TIMES DAILY FOR STOMACH   ondansetron  4 MG tablet Commonly known as:  ZOFRAN TAKE 1 TABLET EVERY 4 HOURS AS NEEDED FOR NAUSEA/VOMITING   ONE TOUCH ULTRA TEST test strip Generic drug:  glucose blood Use to test sugar three times daily dx E11.29   PRESERVISION AREDS 2 PO Take by mouth. 1 by mouth two times daily   SYSTANE OP Apply to eye. One drop both eyes twice daily for dry eyes   tiZANidine 4 MG tablet Commonly known as:  ZANAFLEX Take one tablet by mouth three times daily as needed to relax muscles   trimethoprim 100 MG tablet Commonly known as:  TRIMPEX TAKE ONE TABLET BY MOUTH EVERY NIGHT AT BEDTIME TO PREVENT BLADDER INFECTION   Vitamin D (Ergocalciferol) 50000 units Caps capsule Commonly known as:  DRISDOL TAKE 1 CAPSULE BY MOUTH ONCE A WEEK   VITAMIN E PO Take 400 Units by mouth daily.       Review of Systems:  Review of Systems  Constitutional: Positive for malaise/fatigue. Negative for chills, fever and weight loss.  HENT: Positive for hearing loss.   Eyes:       Glasses  Respiratory: Negative for cough and shortness of breath.   Cardiovascular: Negative for chest pain, palpitations and leg swelling.  Gastrointestinal: Negative for abdominal pain, blood in stool, constipation and melena.  Genitourinary: Negative for dysuria.  Musculoskeletal: Positive for falls, joint pain and myalgias.  Skin: Negative for itching and rash.  Neurological: Negative for weakness.    Health Maintenance  Topic Date Due  . OPHTHALMOLOGY EXAM  09/26/2015  . FOOT EXAM  11/12/2015  . INFLUENZA VACCINE  03/01/2017  . HEMOGLOBIN A1C  06/21/2017  . URINE MICROALBUMIN  09/20/2017  . TETANUS/TDAP  07/21/2023  .  DEXA SCAN  Completed  . PNA vac Low Risk Adult  Completed    Physical Exam: Vitals:   12/22/16 1315  BP: 128/60  Pulse: (!) 58  Temp: 98.2 F (36.8 C)  TempSrc: Oral  SpO2: 94%  Weight: 140 lb (63.5 kg)   Body mass index is 23.3 kg/m. Physical Exam  Constitutional: She is oriented to person,  place, and time. She appears well-developed and well-nourished. No distress.  Cardiovascular: Normal rate, regular rhythm, normal heart sounds and intact distal pulses.   Pulmonary/Chest: Effort normal and breath sounds normal. No respiratory distress.  Abdominal: Soft. Bowel sounds are normal.  Musculoskeletal: Normal range of motion.  Neurological: She is alert and oriented to person, place, and time. A sensory deficit is present. No cranial nerve deficit.  Skin: Skin is warm and dry. Capillary refill takes less than 2 seconds.  Psychiatric: She has a normal mood and affect.    Labs reviewed: Basic Metabolic Panel:  Recent Labs  07/18/16 0907 09/15/16 0901 12/19/16 0900  NA 141 138 141  K 4.6 4.8 4.9  CL 104 100 104  CO2 31 29 26   GLUCOSE 100* 94 187*  BUN 27* 36* 26*  CREATININE 1.42* 1.78* 1.43*  CALCIUM 8.8 9.0 8.8  TSH 1.46 4.27 1.48   Liver Function Tests:  Recent Labs  07/18/16 0907 09/15/16 0901 12/19/16 0900  AST 25 28 17   ALT 21 28 18   ALKPHOS 97 100 115  BILITOT 0.5 0.4 0.6  PROT 6.5 6.7 6.5  ALBUMIN 4.1 3.9 4.0   No results for input(s): LIPASE, AMYLASE in the last 8760 hours. No results for input(s): AMMONIA in the last 8760 hours. CBC:  Recent Labs  05/04/16 2132  HGB 12.6  HCT 37.0   Lipid Panel:  Recent Labs  01/18/16 0915 05/11/16 0909 09/15/16 0901  CHOL 182 147 159  HDL 31* 27* 27*  LDLCALC 111* 92 95  TRIG 202* 140 186*  CHOLHDL 5.9* 5.4* 5.9*   Lab Results  Component Value Date   HGBA1C 6.5 (H) 12/19/2016    Assessment/Plan 1. Controlled type 2 diabetes mellitus with stage 3 chronic kidney disease, with long-term current use of insulin (HCC) - cont current insulin regimen, reinforced regular eating and snacks due to a couple of lows - Ambulatory referral to [facility]  2. Chronic kidney disease, stage III (moderate) - cont to avoid nephrotoxic agents, dose adjust as needd - Ambulatory referral to [place]  3.  Frequent falls - just had 5th fall for the year - Ambulatory referral to [facility] for PT, OT and RN -encouraged walker use (here with cane)  4. Hypothyroidism, unspecified type -TSH was wnl on synthroid  5. Gastroesophageal reflux disease, esophagitis presence not specified -cont omeprazole   6. Primary osteoarthritis of both knees -keep planned appt with Dr. Mardelle Matte for injections  Labs/tests ordered:  Orders Placed This Encounter  Procedures  . Ambulatory referral to Home Health    Referral Priority:   Routine    Referral Type:   Home Health Care    Referral Reason:   Specialty Services Required    Requested Specialty:   [place]    Number of Visits Requested:   1    Next appt:  03/27/2017 med mgt, labs before   Daveion Robar L. Lindaann Gradilla, D.O. [facility] [address], [place] [address] Cell Phone (Mon-Fri 8am-5pm):  [phone] On Call:  [phone] & follow  prompts after 5pm & weekends Office Phone:  [phone] Office Fax:  [phone]

## 2016-12-22 NOTE — Telephone Encounter (Signed)
Spoke with Karen [name] and she stated that she is unsure if Karen [name] checked to see which medication is cheapest so she will have him give the office a call to let us know if there has been a change in her medications.

## 2016-12-22 NOTE — Patient Instructions (Signed)
DASH Eating Plan DASH stands for "Dietary Approaches to Stop Hypertension." The DASH eating plan is a healthy eating plan that has been shown to reduce high blood pressure (hypertension). It may also reduce your risk for type 2 diabetes, heart disease, and stroke. The DASH eating plan may also help with weight loss. What are tips for following this plan? General guidelines  Avoid eating more than 2,300 mg (milligrams) of salt (sodium) a day. If you have hypertension, you may need to reduce your sodium intake to 1,500 mg a day.  Limit alcohol intake to no more than 1 drink a day for nonpregnant women and 2 drinks a day for men. One drink equals 12 oz of beer, 5 oz of wine, or 1 oz of hard liquor.  Work with your health care provider to maintain a healthy body weight or to lose weight. Ask what an ideal weight is for you.  Get at least 30 minutes of exercise that causes your heart to beat faster (aerobic exercise) most days of the week. Activities may include walking, swimming, or biking.  Work with your health care provider or diet and nutrition specialist (dietitian) to adjust your eating plan to your individual calorie needs. Reading food labels  Check food labels for the amount of sodium per serving. Choose foods with less than 5 percent of the Daily Value of sodium. Generally, foods with less than 300 mg of sodium per serving fit into this eating plan.  To find whole grains, look for the word "whole" as the first word in the ingredient list. Shopping  Buy products labeled as "low-sodium" or "no salt added."  Buy fresh foods. Avoid canned foods and premade or frozen meals. Cooking  Avoid adding salt when cooking. Use salt-free seasonings or herbs instead of table salt or sea salt. Check with your health care provider or pharmacist before using salt substitutes.  Do not fry foods. Cook foods using healthy methods such as baking, boiling, grilling, and broiling instead.  Cook with  heart-healthy oils, such as olive, canola, soybean, or sunflower oil. Meal planning   Eat a balanced diet that includes: ? 5 or more servings of fruits and vegetables each day. At each meal, try to fill half of your plate with fruits and vegetables. ? Up to 6-8 servings of whole grains each day. ? Less than 6 oz of lean meat, poultry, or fish each day. A 3-oz serving of meat is about the same size as a deck of cards. One egg equals 1 oz. ? 2 servings of low-fat dairy each day. ? A serving of nuts, seeds, or beans 5 times each week. ? Heart-healthy fats. Healthy fats called Omega-3 fatty acids are found in foods such as flaxseeds and coldwater fish, like sardines, salmon, and mackerel.  Limit how much you eat of the following: ? Canned or prepackaged foods. ? Food that is high in trans fat, such as fried foods. ? Food that is high in saturated fat, such as fatty meat. ? Sweets, desserts, sugary drinks, and other foods with added sugar. ? Full-fat dairy products.  Do not salt foods before eating.  Try to eat at least 2 vegetarian meals each week.  Eat more home-cooked food and less restaurant, buffet, and fast food.  When eating at a restaurant, ask that your food be prepared with less salt or no salt, if possible. What foods are recommended? The items listed may not be a complete list. Talk with your dietitian about what   dietary choices are best for you. Grains Whole-grain or whole-wheat bread. Whole-grain or whole-wheat pasta. Brown rice. Oatmeal. Quinoa. Bulgur. Whole-grain and low-sodium cereals. Pita bread. Low-fat, low-sodium crackers. Whole-wheat flour tortillas. Vegetables Fresh or frozen vegetables (raw, steamed, roasted, or grilled). Low-sodium or reduced-sodium tomato and vegetable juice. Low-sodium or reduced-sodium tomato sauce and tomato paste. Low-sodium or reduced-sodium canned vegetables. Fruits All fresh, dried, or frozen fruit. Canned fruit in natural juice (without  added sugar). Meat and other protein foods Skinless chicken or turkey. Ground chicken or turkey. Pork with fat trimmed off. Fish and seafood. Egg whites. Dried beans, peas, or lentils. Unsalted nuts, nut butters, and seeds. Unsalted canned beans. Lean cuts of beef with fat trimmed off. Low-sodium, lean deli meat. Dairy Low-fat (1%) or fat-free (skim) milk. Fat-free, low-fat, or reduced-fat cheeses. Nonfat, low-sodium ricotta or cottage cheese. Low-fat or nonfat yogurt. Low-fat, low-sodium cheese. Fats and oils Soft margarine without trans fats. Vegetable oil. Low-fat, reduced-fat, or light mayonnaise and salad dressings (reduced-sodium). Canola, safflower, olive, soybean, and sunflower oils. Avocado. Seasoning and other foods Herbs. Spices. Seasoning mixes without salt. Unsalted popcorn and pretzels. Fat-free sweets. What foods are not recommended? The items listed may not be a complete list. Talk with your dietitian about what dietary choices are best for you. Grains Baked goods made with fat, such as croissants, muffins, or some breads. Dry pasta or rice meal packs. Vegetables Creamed or fried vegetables. Vegetables in a cheese sauce. Regular canned vegetables (not low-sodium or reduced-sodium). Regular canned tomato sauce and paste (not low-sodium or reduced-sodium). Regular tomato and vegetable juice (not low-sodium or reduced-sodium). Pickles. Olives. Fruits Canned fruit in a light or heavy syrup. Fried fruit. Fruit in cream or butter sauce. Meat and other protein foods Fatty cuts of meat. Ribs. Fried meat. Bacon. Sausage. Bologna and other processed lunch meats. Salami. Fatback. Hotdogs. Bratwurst. Salted nuts and seeds. Canned beans with added salt. Canned or smoked fish. Whole eggs or egg yolks. Chicken or turkey with skin. Dairy Whole or 2% milk, cream, and half-and-half. Whole or full-fat cream cheese. Whole-fat or sweetened yogurt. Full-fat cheese. Nondairy creamers. Whipped toppings.  Processed cheese and cheese spreads. Fats and oils Butter. Stick margarine. Lard. Shortening. Ghee. Bacon fat. Tropical oils, such as coconut, palm kernel, or palm oil. Seasoning and other foods Salted popcorn and pretzels. Onion salt, garlic salt, seasoned salt, table salt, and sea salt. Worcestershire sauce. Tartar sauce. Barbecue sauce. Teriyaki sauce. Soy sauce, including reduced-sodium. Steak sauce. Canned and packaged gravies. Fish sauce. Oyster sauce. Cocktail sauce. Horseradish that you find on the shelf. Ketchup. Mustard. Meat flavorings and tenderizers. Bouillon cubes. Hot sauce and Tabasco sauce. Premade or packaged marinades. Premade or packaged taco seasonings. Relishes. Regular salad dressings. Where to find more information:  National Heart, Lung, and Blood Institute: www.nhlbi.nih.gov  American Heart Association: www.heart.org Summary  The DASH eating plan is a healthy eating plan that has been shown to reduce high blood pressure (hypertension). It may also reduce your risk for type 2 diabetes, heart disease, and stroke.  With the DASH eating plan, you should limit salt (sodium) intake to 2,300 mg a day. If you have hypertension, you may need to reduce your sodium intake to 1,500 mg a day.  When on the DASH eating plan, aim to eat more fresh fruits and vegetables, whole grains, lean proteins, low-fat dairy, and heart-healthy fats.  Work with your health care provider or diet and nutrition specialist (dietitian) to adjust your eating plan to your individual   calorie needs. This information is not intended to replace advice given to you by your health care provider. Make sure you discuss any questions you have with your health care provider. Document Released: 07/07/2011 Document Revised: 07/11/2016 Document Reviewed: 07/11/2016 Elsevier Interactive Patient Education  2017 Elsevier Inc.  

## 2016-12-23 NOTE — Telephone Encounter (Signed)
Medication list update at appt. On 12/22/2016.

## 2016-12-27 ENCOUNTER — Other Ambulatory Visit: Payer: Self-pay | Admitting: Internal Medicine

## 2016-12-28 ENCOUNTER — Other Ambulatory Visit: Payer: Self-pay | Admitting: Internal Medicine

## 2016-12-30 DIAGNOSIS — R251 Tremor, unspecified: Secondary | ICD-10-CM | POA: Diagnosis not present

## 2016-12-30 DIAGNOSIS — Z7982 Long term (current) use of aspirin: Secondary | ICD-10-CM | POA: Diagnosis not present

## 2016-12-30 DIAGNOSIS — M48061 Spinal stenosis, lumbar region without neurogenic claudication: Secondary | ICD-10-CM | POA: Diagnosis not present

## 2016-12-30 DIAGNOSIS — M170 Bilateral primary osteoarthritis of knee: Secondary | ICD-10-CM | POA: Diagnosis not present

## 2016-12-30 DIAGNOSIS — E1143 Type 2 diabetes mellitus with diabetic autonomic (poly)neuropathy: Secondary | ICD-10-CM | POA: Diagnosis not present

## 2016-12-30 DIAGNOSIS — N183 Chronic kidney disease, stage 3 (moderate): Secondary | ICD-10-CM | POA: Diagnosis not present

## 2016-12-30 DIAGNOSIS — Z79891 Long term (current) use of opiate analgesic: Secondary | ICD-10-CM | POA: Diagnosis not present

## 2016-12-30 DIAGNOSIS — I110 Hypertensive heart disease with heart failure: Secondary | ICD-10-CM | POA: Diagnosis not present

## 2016-12-30 DIAGNOSIS — Z794 Long term (current) use of insulin: Secondary | ICD-10-CM | POA: Diagnosis not present

## 2016-12-30 DIAGNOSIS — I509 Heart failure, unspecified: Secondary | ICD-10-CM | POA: Diagnosis not present

## 2016-12-30 DIAGNOSIS — E1151 Type 2 diabetes mellitus with diabetic peripheral angiopathy without gangrene: Secondary | ICD-10-CM | POA: Diagnosis not present

## 2016-12-30 DIAGNOSIS — E1122 Type 2 diabetes mellitus with diabetic chronic kidney disease: Secondary | ICD-10-CM | POA: Diagnosis not present

## 2016-12-30 DIAGNOSIS — R296 Repeated falls: Secondary | ICD-10-CM | POA: Diagnosis not present

## 2017-01-03 ENCOUNTER — Other Ambulatory Visit: Payer: Self-pay | Admitting: Internal Medicine

## 2017-01-03 DIAGNOSIS — F419 Anxiety disorder, unspecified: Secondary | ICD-10-CM

## 2017-01-04 ENCOUNTER — Other Ambulatory Visit: Payer: Self-pay | Admitting: Internal Medicine

## 2017-01-04 DIAGNOSIS — E1143 Type 2 diabetes mellitus with diabetic autonomic (poly)neuropathy: Secondary | ICD-10-CM | POA: Diagnosis not present

## 2017-01-04 DIAGNOSIS — F419 Anxiety disorder, unspecified: Secondary | ICD-10-CM

## 2017-01-04 DIAGNOSIS — Z794 Long term (current) use of insulin: Secondary | ICD-10-CM | POA: Diagnosis not present

## 2017-01-04 DIAGNOSIS — I509 Heart failure, unspecified: Secondary | ICD-10-CM | POA: Diagnosis not present

## 2017-01-04 DIAGNOSIS — Z7982 Long term (current) use of aspirin: Secondary | ICD-10-CM | POA: Diagnosis not present

## 2017-01-04 DIAGNOSIS — M170 Bilateral primary osteoarthritis of knee: Secondary | ICD-10-CM | POA: Diagnosis not present

## 2017-01-04 DIAGNOSIS — Z79891 Long term (current) use of opiate analgesic: Secondary | ICD-10-CM | POA: Diagnosis not present

## 2017-01-04 DIAGNOSIS — M48061 Spinal stenosis, lumbar region without neurogenic claudication: Secondary | ICD-10-CM | POA: Diagnosis not present

## 2017-01-04 DIAGNOSIS — R251 Tremor, unspecified: Secondary | ICD-10-CM | POA: Diagnosis not present

## 2017-01-04 DIAGNOSIS — E1122 Type 2 diabetes mellitus with diabetic chronic kidney disease: Secondary | ICD-10-CM | POA: Diagnosis not present

## 2017-01-04 DIAGNOSIS — R296 Repeated falls: Secondary | ICD-10-CM | POA: Diagnosis not present

## 2017-01-04 DIAGNOSIS — I110 Hypertensive heart disease with heart failure: Secondary | ICD-10-CM | POA: Diagnosis not present

## 2017-01-04 DIAGNOSIS — N183 Chronic kidney disease, stage 3 (moderate): Secondary | ICD-10-CM | POA: Diagnosis not present

## 2017-01-04 DIAGNOSIS — E1151 Type 2 diabetes mellitus with diabetic peripheral angiopathy without gangrene: Secondary | ICD-10-CM | POA: Diagnosis not present

## 2017-01-05 ENCOUNTER — Other Ambulatory Visit: Payer: Self-pay | Admitting: Internal Medicine

## 2017-01-05 DIAGNOSIS — F419 Anxiety disorder, unspecified: Secondary | ICD-10-CM

## 2017-01-05 NOTE — Telephone Encounter (Signed)
WE can refer to a driving capability course, but there will be a fee. I do not know how much.

## 2017-01-06 ENCOUNTER — Telehealth: Payer: Self-pay

## 2017-01-06 DIAGNOSIS — R251 Tremor, unspecified: Secondary | ICD-10-CM | POA: Diagnosis not present

## 2017-01-06 DIAGNOSIS — I110 Hypertensive heart disease with heart failure: Secondary | ICD-10-CM | POA: Diagnosis not present

## 2017-01-06 DIAGNOSIS — Z794 Long term (current) use of insulin: Secondary | ICD-10-CM | POA: Diagnosis not present

## 2017-01-06 DIAGNOSIS — Z7982 Long term (current) use of aspirin: Secondary | ICD-10-CM | POA: Diagnosis not present

## 2017-01-06 DIAGNOSIS — N183 Chronic kidney disease, stage 3 (moderate): Secondary | ICD-10-CM | POA: Diagnosis not present

## 2017-01-06 DIAGNOSIS — R296 Repeated falls: Secondary | ICD-10-CM | POA: Diagnosis not present

## 2017-01-06 DIAGNOSIS — M48061 Spinal stenosis, lumbar region without neurogenic claudication: Secondary | ICD-10-CM | POA: Diagnosis not present

## 2017-01-06 DIAGNOSIS — I509 Heart failure, unspecified: Secondary | ICD-10-CM | POA: Diagnosis not present

## 2017-01-06 DIAGNOSIS — Z79891 Long term (current) use of opiate analgesic: Secondary | ICD-10-CM | POA: Diagnosis not present

## 2017-01-06 DIAGNOSIS — E1143 Type 2 diabetes mellitus with diabetic autonomic (poly)neuropathy: Secondary | ICD-10-CM | POA: Diagnosis not present

## 2017-01-06 DIAGNOSIS — E1151 Type 2 diabetes mellitus with diabetic peripheral angiopathy without gangrene: Secondary | ICD-10-CM | POA: Diagnosis not present

## 2017-01-06 DIAGNOSIS — E1122 Type 2 diabetes mellitus with diabetic chronic kidney disease: Secondary | ICD-10-CM | POA: Diagnosis not present

## 2017-01-06 DIAGNOSIS — M170 Bilateral primary osteoarthritis of knee: Secondary | ICD-10-CM | POA: Diagnosis not present

## 2017-01-06 NOTE — Telephone Encounter (Addendum)
Verbal order request for:  Home PT 2 x weekly x 7 week for fall prevention and strengthening  Per [facility] standing order, verbal order given. Message will be sent to patient's provider as a FYI.

## 2017-01-10 DIAGNOSIS — Z794 Long term (current) use of insulin: Secondary | ICD-10-CM | POA: Diagnosis not present

## 2017-01-10 DIAGNOSIS — E1143 Type 2 diabetes mellitus with diabetic autonomic (poly)neuropathy: Secondary | ICD-10-CM | POA: Diagnosis not present

## 2017-01-10 DIAGNOSIS — I110 Hypertensive heart disease with heart failure: Secondary | ICD-10-CM | POA: Diagnosis not present

## 2017-01-10 DIAGNOSIS — M48061 Spinal stenosis, lumbar region without neurogenic claudication: Secondary | ICD-10-CM | POA: Diagnosis not present

## 2017-01-10 DIAGNOSIS — Z79891 Long term (current) use of opiate analgesic: Secondary | ICD-10-CM | POA: Diagnosis not present

## 2017-01-10 DIAGNOSIS — Z7982 Long term (current) use of aspirin: Secondary | ICD-10-CM | POA: Diagnosis not present

## 2017-01-10 DIAGNOSIS — M170 Bilateral primary osteoarthritis of knee: Secondary | ICD-10-CM | POA: Diagnosis not present

## 2017-01-10 DIAGNOSIS — I509 Heart failure, unspecified: Secondary | ICD-10-CM | POA: Diagnosis not present

## 2017-01-10 DIAGNOSIS — Z8673 Personal history of transient ischemic attack (TIA), and cerebral infarction without residual deficits: Secondary | ICD-10-CM | POA: Diagnosis not present

## 2017-01-10 DIAGNOSIS — E1151 Type 2 diabetes mellitus with diabetic peripheral angiopathy without gangrene: Secondary | ICD-10-CM | POA: Diagnosis not present

## 2017-01-10 DIAGNOSIS — E1122 Type 2 diabetes mellitus with diabetic chronic kidney disease: Secondary | ICD-10-CM | POA: Diagnosis not present

## 2017-01-10 DIAGNOSIS — R296 Repeated falls: Secondary | ICD-10-CM | POA: Diagnosis not present

## 2017-01-10 DIAGNOSIS — Z9181 History of falling: Secondary | ICD-10-CM | POA: Diagnosis not present

## 2017-01-10 DIAGNOSIS — N183 Chronic kidney disease, stage 3 (moderate): Secondary | ICD-10-CM | POA: Diagnosis not present

## 2017-01-10 DIAGNOSIS — R251 Tremor, unspecified: Secondary | ICD-10-CM | POA: Diagnosis not present

## 2017-01-11 ENCOUNTER — Telehealth: Payer: Self-pay | Admitting: *Deleted

## 2017-01-11 NOTE — Telephone Encounter (Signed)
Received [employer] Patient Assistance Form from [employer] for patient's Humalog Insulin. Filled out and placed in Dr. Cyndi Lennert folder to review and sign. Will need to fax back to [phone] after signed and Emberlyn brings her portion. (note placed on form to return to [place] once signed)

## 2017-01-12 DIAGNOSIS — I509 Heart failure, unspecified: Secondary | ICD-10-CM | POA: Diagnosis not present

## 2017-01-12 DIAGNOSIS — R296 Repeated falls: Secondary | ICD-10-CM | POA: Diagnosis not present

## 2017-01-12 DIAGNOSIS — Z794 Long term (current) use of insulin: Secondary | ICD-10-CM | POA: Diagnosis not present

## 2017-01-12 DIAGNOSIS — R251 Tremor, unspecified: Secondary | ICD-10-CM | POA: Diagnosis not present

## 2017-01-12 DIAGNOSIS — E1143 Type 2 diabetes mellitus with diabetic autonomic (poly)neuropathy: Secondary | ICD-10-CM | POA: Diagnosis not present

## 2017-01-12 DIAGNOSIS — Z79891 Long term (current) use of opiate analgesic: Secondary | ICD-10-CM | POA: Diagnosis not present

## 2017-01-12 DIAGNOSIS — M170 Bilateral primary osteoarthritis of knee: Secondary | ICD-10-CM | POA: Diagnosis not present

## 2017-01-12 DIAGNOSIS — I110 Hypertensive heart disease with heart failure: Secondary | ICD-10-CM | POA: Diagnosis not present

## 2017-01-12 DIAGNOSIS — E1122 Type 2 diabetes mellitus with diabetic chronic kidney disease: Secondary | ICD-10-CM | POA: Diagnosis not present

## 2017-01-12 DIAGNOSIS — E1151 Type 2 diabetes mellitus with diabetic peripheral angiopathy without gangrene: Secondary | ICD-10-CM | POA: Diagnosis not present

## 2017-01-12 DIAGNOSIS — Z8673 Personal history of transient ischemic attack (TIA), and cerebral infarction without residual deficits: Secondary | ICD-10-CM | POA: Diagnosis not present

## 2017-01-12 DIAGNOSIS — M48061 Spinal stenosis, lumbar region without neurogenic claudication: Secondary | ICD-10-CM | POA: Diagnosis not present

## 2017-01-12 DIAGNOSIS — Z7982 Long term (current) use of aspirin: Secondary | ICD-10-CM | POA: Diagnosis not present

## 2017-01-12 DIAGNOSIS — N183 Chronic kidney disease, stage 3 (moderate): Secondary | ICD-10-CM | POA: Diagnosis not present

## 2017-01-12 DIAGNOSIS — Z9181 History of falling: Secondary | ICD-10-CM | POA: Diagnosis not present

## 2017-01-16 ENCOUNTER — Other Ambulatory Visit (HOSPITAL_COMMUNITY): Payer: Self-pay | Admitting: Orthopedic Surgery

## 2017-01-16 DIAGNOSIS — M7989 Other specified soft tissue disorders: Principal | ICD-10-CM

## 2017-01-16 DIAGNOSIS — M79605 Pain in left leg: Secondary | ICD-10-CM

## 2017-01-16 DIAGNOSIS — M170 Bilateral primary osteoarthritis of knee: Secondary | ICD-10-CM | POA: Diagnosis not present

## 2017-01-17 ENCOUNTER — Ambulatory Visit (HOSPITAL_COMMUNITY)
Admission: RE | Admit: 2017-01-17 | Discharge: 2017-01-17 | Disposition: A | Discharge status: Home / Self Care | Payer: Medicare Other | Source: Ambulatory Visit | Attending: Internal Medicine | Admitting: Internal Medicine

## 2017-01-17 DIAGNOSIS — Z7982 Long term (current) use of aspirin: Secondary | ICD-10-CM | POA: Diagnosis not present

## 2017-01-17 DIAGNOSIS — I509 Heart failure, unspecified: Secondary | ICD-10-CM | POA: Diagnosis not present

## 2017-01-17 DIAGNOSIS — M7989 Other specified soft tissue disorders: Secondary | ICD-10-CM | POA: Diagnosis not present

## 2017-01-17 DIAGNOSIS — N183 Chronic kidney disease, stage 3 (moderate): Secondary | ICD-10-CM | POA: Diagnosis not present

## 2017-01-17 DIAGNOSIS — Z794 Long term (current) use of insulin: Secondary | ICD-10-CM | POA: Diagnosis not present

## 2017-01-17 DIAGNOSIS — R296 Repeated falls: Secondary | ICD-10-CM | POA: Diagnosis not present

## 2017-01-17 DIAGNOSIS — M79605 Pain in left leg: Secondary | ICD-10-CM | POA: Insufficient documentation

## 2017-01-17 DIAGNOSIS — E1151 Type 2 diabetes mellitus with diabetic peripheral angiopathy without gangrene: Secondary | ICD-10-CM | POA: Diagnosis not present

## 2017-01-17 DIAGNOSIS — Z79891 Long term (current) use of opiate analgesic: Secondary | ICD-10-CM | POA: Diagnosis not present

## 2017-01-17 DIAGNOSIS — E1143 Type 2 diabetes mellitus with diabetic autonomic (poly)neuropathy: Secondary | ICD-10-CM | POA: Diagnosis not present

## 2017-01-17 DIAGNOSIS — M170 Bilateral primary osteoarthritis of knee: Secondary | ICD-10-CM | POA: Diagnosis not present

## 2017-01-17 DIAGNOSIS — E1122 Type 2 diabetes mellitus with diabetic chronic kidney disease: Secondary | ICD-10-CM | POA: Diagnosis not present

## 2017-01-17 DIAGNOSIS — R251 Tremor, unspecified: Secondary | ICD-10-CM | POA: Diagnosis not present

## 2017-01-17 DIAGNOSIS — I110 Hypertensive heart disease with heart failure: Secondary | ICD-10-CM | POA: Diagnosis not present

## 2017-01-17 DIAGNOSIS — M48061 Spinal stenosis, lumbar region without neurogenic claudication: Secondary | ICD-10-CM | POA: Diagnosis not present

## 2017-01-17 NOTE — Progress Notes (Signed)
*  PRELIMINARY RESULTS* Vascular Ultrasound Left lower extremity venous duplex has been completed.  Preliminary findings: No evidence of DVT or baker's cyst.   Attempted call report to Dr. Mardelle Matte with no answer. Left voice message.    Landry Mellow, RDMS, RVT  01/17/2017, 9:20 AM

## 2017-01-17 NOTE — Telephone Encounter (Signed)
Received paperwork from patient.  Faxed to [employer] Patient Assistance Program [phone] Fax: [phone]. Sent for scanning.

## 2017-01-18 ENCOUNTER — Encounter: Payer: Self-pay | Admitting: Nurse Practitioner

## 2017-01-18 ENCOUNTER — Ambulatory Visit (INDEPENDENT_AMBULATORY_CARE_PROVIDER_SITE_OTHER): Payer: Medicare Other | Admitting: Nurse Practitioner

## 2017-01-18 VITALS — BP 132/78 | HR 63 | Temp 97.8°F | Resp 17 | Ht 65.0 in | Wt 139.8 lb

## 2017-01-18 DIAGNOSIS — H60502 Unspecified acute noninfective otitis externa, left ear: Secondary | ICD-10-CM

## 2017-01-18 DIAGNOSIS — L03116 Cellulitis of left lower limb: Secondary | ICD-10-CM

## 2017-01-18 DIAGNOSIS — M25572 Pain in left ankle and joints of left foot: Secondary | ICD-10-CM | POA: Diagnosis not present

## 2017-01-18 LAB — CBC WITH DIFFERENTIAL/PLATELET
BASOS PCT: 0 %
Basophils Absolute: 0 cells/uL (ref 0–200)
Eosinophils Absolute: 327 cells/uL (ref 15–500)
Eosinophils Relative: 3 %
HEMATOCRIT: 34.7 % — AB (ref 35.0–45.0)
Hemoglobin: 11.2 g/dL — ABNORMAL LOW (ref 11.7–15.5)
LYMPHS PCT: 17 %
Lymphs Abs: 1853 cells/uL (ref 850–3900)
MCH: 30.4 pg (ref 27.0–33.0)
MCHC: 32.3 g/dL (ref 32.0–36.0)
MCV: 94.3 fL (ref 80.0–100.0)
MONO ABS: 1090 {cells}/uL — AB (ref 200–950)
MONOS PCT: 10 %
MPV: 10.6 fL (ref 7.5–12.5)
Neutro Abs: 7630 cells/uL (ref 1500–7800)
Neutrophils Relative %: 70 %
PLATELETS: 242 10*3/uL (ref 140–400)
RBC: 3.68 MIL/uL — AB (ref 3.80–5.10)
RDW: 13.4 % (ref 11.0–15.0)
WBC: 10.9 10*3/uL — ABNORMAL HIGH (ref 3.8–10.8)

## 2017-01-18 LAB — URIC ACID: URIC ACID, SERUM: 6.8 mg/dL (ref 2.5–7.0)

## 2017-01-18 MED ORDER — NEOMYCIN-POLYMYXIN-HC 3.5-10000-1 OT SOLN: 4.0000 [drp] | Freq: Four times a day (QID) | OTIC | 0 refills | Status: DC

## 2017-01-18 MED ORDER — CEPHALEXIN 500 MG PO CAPS: 500.0000 mg | ORAL_CAPSULE | Freq: Three times a day (TID) | ORAL | 0 refills | Status: DC

## 2017-01-18 NOTE — Patient Instructions (Signed)
To use Keflex by mouth three times daily for leg To use ear drops into left ear 4 times daily for ear

## 2017-01-18 NOTE — Progress Notes (Signed)
Careteam: Patient Care Team: Gayland Curry, DO as PCP - General (Geriatric Medicine) Leia Alf, DPM (Inactive) as Attending Physician (Podiatry) Marchia Bond, MD as Consulting Physician (Orthopedic Surgery)  Advanced Directive information Does Patient Have a Medical Advance Directive?: Yes, Type of Advance Directive: [place];Living will  Allergies  Allergen Reactions  . Celebrex [Celecoxib] Rash  . Doxycycline Rash  . Lyrica [Pregabalin] Rash  . Avandia [Rosiglitazone]   . Macrodantin [Nitrofurantoin Macrocrystal]   . Sulfa Antibiotics   . Vioxx [Rofecoxib]   . Silicone Rash    Gets rash from the touch it.    Chief Complaint  Patient presents with  . Acute Visit    Pt has swelling/reddness to lower left leg x 3 days. Vascular US done yesterday: no DVT. Pt also states that she has left ear pain x 2 days.      HPI: Patient is a 83 y.o. female seen in the office today due to redness and swelling in left LE. Pt went to orthopedic for knee injection but due to redness and swelling they wanted to rule out DVT prior to injection.  [place] was done and was negative for DVT. conts to have swelling and pain to left lower leg. Also warm. Redness from left foot up mid calf No fevers or chills.  No hx of gout  Reports increase pain to left ear. Has been using sweet oil but has not noticed a change in the discomfort. Denies drainage, redness or heat.   Review of Systems:  Review of Systems  Constitutional: Negative for chills, fever and malaise/fatigue.  HENT: Positive for ear pain.   Respiratory: Negative for cough and shortness of breath.   Cardiovascular: Positive for leg swelling.  Musculoskeletal: Positive for joint pain and myalgias.       Chronic bilateral leg pain. Worse in left today  Skin:       Redness, swelling and pain to Left LE  Neurological: Negative for weakness.    Past Medical History:  Diagnosis Date  . A-fib ([place])   .  Abnormal involuntary movements(781.0)   . Anemia   . Anxiety   . Anxiety state, unspecified   . Arthritis    body, neck  . Benign hypertensive heart disease   . Benign hypertensive heart disease with congestive heart failure ([place])   . Bladder disorder    over active  . Cancer ([place])    skin, removed  . Cervicalgia   . CHF (congestive heart failure) ([place])   . Chronic kidney disease, stage III (moderate)   . Congestive heart failure, unspecified   . Contact dermatitis and other eczema, due to unspecified cause   . Corns and callosities   . Diarrhea   . Dyspnea 02/18/2015  . Edema   . Esophageal stricture   . Gastroparesis   . Gastroparesis   . GERD (gastroesophageal reflux disease)   . Hiatal hernia   . Hyperlipidemia   . Inflamed seborrheic keratosis   . Inflammatory disease of breast   . Irregular heartbeat   . Lumbago   . Mixed incontinence urge and stress (female)(female)   . Nausea alone   . Nonspecific (abnormal) findings on radiological and other examination of skull and head   . Osteoarthrosis, unspecified whether generalized or localized, unspecified site   . Other and unspecified hyperlipidemia   . Other B-complex deficiencies   . Other functional disorder of bladder   . Other malaise and fatigue   .  Other specified visual disturbances   . Pain in joint, lower leg   . Palpitations   . Peripheral vascular disease, unspecified ([place])   . Postmenopausal atrophic vaginitis   . Reflux esophagitis   . Seborrheic keratosis   . Sleep apnea   . Spasm of muscle   . Spinal stenosis   . Spinal stenosis, unspecified region other than cervical   . Stricture and stenosis of esophagus   . Stroke ([place])   . TIA (transient ischemic attack) 2012  . Transient ischemic attack (TIA), and cerebral infarction without residual deficits(V12.54)   . Type II or unspecified type diabetes mellitus with renal manifestations, not stated as uncontrolled(250.40)   . Unspecified constipation     . Unspecified essential hypertension   . Unspecified hereditary and idiopathic peripheral neuropathy   . Unspecified hypothyroidism   . Unspecified pruritic disorder   . Unspecified sleep apnea   . Unspecified vitamin D deficiency   . Urinary tract infection, site not specified   . Vitamin B12 deficiency    Past Surgical History:  Procedure Laterality Date  . ABDOMINAL HYSTERECTOMY    . BACK SURGERY     x 2  . CHOLECYSTECTOMY  1991  . esophageal  2004,2006   dilation, Dr Lyla Son  . [place]   bilateral cataract  . hammer toes    . HERNIA REPAIR  1984aaaaaaaa  . SKIN CANCER DESTRUCTION    . THYROIDECTOMY, PARTIAL  1965   Social History:   reports that she has never smoked. She has never used smokeless tobacco. She reports that she does not drink alcohol or use drugs.  Family History  Problem Relation Age of Onset  . Cancer Mother        ? stomach  . Heart disease Father   . Hyperlipidemia Father   . Hypertension Father   . Heart attack Father   . Heart disease Brother   . Kidney disease Daughter   . Heart disease Brother   . Cancer Son     Medications: Patient's Medications  New Prescriptions   No medications on file  Previous Medications   ALPRAZOLAM (XANAX) 0.5 MG TABLET    TAKE ONE TABLET BY MOUTH THREE TIMES DAILY AS NEEDED FOR ANXIETY OR SLEEP   ASPIRIN 325 MG EC TABLET    Take 325 mg by mouth daily.   CRANBERRY PO    Take 300 mg by mouth daily.   [facility] NATURAL LUTEIN EYE HEALTH PO    Take by mouth. One tablet once daily for eyes   DIGOXIN (LANOXIN) 0.125 MG TABLET    TAKE 1 TABLET BY MOUTH ON MONDAY, WEDNESDAY, FRIDAY AND SATURDAY   DIPHENHYDRAMINE-ZINC ACETATE (BENADRYL ITCH STOPPING) CREAM    Apply 1 application topically 3 (three) times daily as needed.   FUROSEMIDE (LASIX) 40 MG TABLET    One daily to control edema   GABAPENTIN (NEURONTIN) 100 MG CAPSULE    Take 2 capsules by mouth at bedtime to help tremors.   HUMALOG KWIKPEN 100  UNIT/ML KIWKPEN    INJECT 10 UNITS BEFORE BREAKFAST, 12 UNITS BEFORE LUNCH, AND 14 UNITSBEFORE SUPPER TO CONTROL SUGAR. MAY USE UP TO 3 MORE UNITS PER MEAL AS   HYDROCODONE-ACETAMINOPHEN (NORCO/VICODIN) 5-325 MG TABLET    TAKE 1 TABLET BY MOUTH 4 TIMES A DAY AS NEEDED FOR PAIN.   INSULIN GLARGINE (LANTUS) 100 UNIT/ML INJECTION    Inject 0.42 mLs (42 Units total) into the skin at  bedtime.   INSULIN PEN NEEDLE (B-D ULTRAFINE III SHORT PEN) 31G X 8 MM MISC    Use with administering insulin. Dx. E11.29   LEVOTHYROXINE (SYNTHROID, LEVOTHROID) 125 MCG TABLET    TAKE 1 TABLET BY MOUTH EVERY MORNING 30 MINUTES BEFORE BREAKFAST.   METOPROLOL (LOPRESSOR) 50 MG TABLET    TAKE 1 TABLET BY MOUTH TWICE A DAY TO CONTROL HEART RATE   MULTIPLE VITAMINS-MINERALS (PRESERVISION AREDS 2 PO)    Take by mouth. 1 by mouth two times daily   NITROGLYCERIN (NITROSTAT) 0.4 MG SL TABLET    Place 1 tablet (0.4 mg total) under the tongue every 5 (five) minutes as needed for chest pain.   OMEPRAZOLE (PRILOSEC) 40 MG CAPSULE    TAKE ONE (1) CAPSULE BY MOUTH 2 TIMES DAILY FOR STOMACH   ONDANSETRON (ZOFRAN) 4 MG TABLET    TAKE 1 TABLET EVERY 4 HOURS AS NEEDED FOR NAUSEA/VOMITING   ONE TOUCH ULTRA TEST TEST STRIP    Use to test sugar three times daily dx E11.29   POLYETHYL GLYCOL-PROPYL GLYCOL (SYSTANE OP)    Apply to eye. One drop both eyes twice daily for dry eyes   TRIMETHOPRIM (TRIMPEX) 100 MG TABLET    TAKE ONE TABLET BY MOUTH EVERY NIGHT AT BEDTIME TO PREVENT BLADDER INFECTION   VITAMIN D, ERGOCALCIFEROL, (DRISDOL) 50000 UNITS CAPS CAPSULE    TAKE 1 CAPSULE BY MOUTH ONCE A WEEK   VITAMIN E PO    Take 400 Units by mouth daily.  Modified Medications   No medications on file  Discontinued Medications   No medications on file     Physical Exam:  Vitals:   01/18/17 0952  BP: 132/78  Pulse: 63  Resp: 17  Temp: 97.8 F (36.6 C)  TempSrc: Oral  SpO2: 96%  Weight: 139 lb 12.8 oz (63.4 kg)  Height: 5\' 5"  (1.651 m)    Body mass index is 23.26 kg/m.  Physical Exam  Constitutional: She is oriented to person, place, and time. She appears well-developed and well-nourished. No distress.  HENT:  Right Ear: There is tenderness.  Tenderness noted to outer ear on right Wax in canal unable to see TM Wet appearing ear however reports she just applied sweet oil.   Cardiovascular: Normal rate, regular rhythm, normal heart sounds and intact distal pulses.   Pulmonary/Chest: Effort normal and breath sounds normal. No respiratory distress.  Musculoskeletal: Normal range of motion.  Neurological: She is alert and oriented to person, place, and time. No cranial nerve deficit.  Skin: Skin is warm and dry. Capillary refill takes less than 2 seconds.  Erythema, swelling, tenderness and warmth noted to left foot, ankle and leg. No wound or open area noted.    Psychiatric: She has a normal mood and affect.    Labs reviewed: Basic Metabolic Panel:  Recent Labs  07/18/16 0907 09/15/16 0901 12/19/16 0900  NA 141 138 141  K 4.6 4.8 4.9  CL 104 100 104  CO2 31 29 26   GLUCOSE 100* 94 187*  BUN 27* 36* 26*  CREATININE 1.42* 1.78* 1.43*  CALCIUM 8.8 9.0 8.8  TSH 1.46 4.27 1.48   Liver Function Tests:  Recent Labs  07/18/16 0907 09/15/16 0901 12/19/16 0900  AST 25 28 17   ALT 21 28 18   ALKPHOS 97 100 115  BILITOT 0.5 0.4 0.6  PROT 6.5 6.7 6.5  ALBUMIN 4.1 3.9 4.0   No results for input(s): LIPASE, AMYLASE in the last 8760 hours. No  results for input(s): AMMONIA in the last 8760 hours. CBC:  Recent Labs  05/04/16 2132  HGB 12.6  HCT 37.0   Lipid Panel:  Recent Labs  05/11/16 0909 09/15/16 0901  CHOL 147 159  HDL 27* 27*  LDLCALC 92 95  TRIG 140 186*  CHOLHDL 5.4* 5.9*   TSH:  Recent Labs  07/18/16 0907 09/15/16 0901 12/19/16 0900  TSH 1.46 4.27 1.48   A1C: Lab Results  Component Value Date   HGBA1C 6.5 (H) 12/19/2016     Assessment/Plan 1/2. Cellulitis of left lower  extremity with ankle pain -redness, pain and warm to left lower leg.  -very tender leg and ankle.  -DVT ruled out - CBC with Differential/Platelets - cephALEXin (KEFLEX) 500 MG capsule; Take 1 capsule (500 mg total) by mouth 3 (three) times daily.  Dispense: 21 capsule; Refill: 0 - Uric acid to rule out gout to left ankle. Increase pain could be due to cellulitis vs inflammatory.   3. Acute otitis externa of left ear, unspecified type - neomycin-polymyxin-hydrocortisone (CORTISPORIN) OTIC solution; Place 4 drops into the left ear 4 (four) times daily.  Dispense: 10 mL; Refill: 0  Return precautions discussed. To go to ED if pain, swelling or redness worsens or fever occurs.   Carlos American. Harle Battiest  [facility] [phone] 8 am - 5 pm) [phone] (after hours)

## 2017-01-19 DIAGNOSIS — Z794 Long term (current) use of insulin: Secondary | ICD-10-CM | POA: Diagnosis not present

## 2017-01-19 DIAGNOSIS — Z7982 Long term (current) use of aspirin: Secondary | ICD-10-CM | POA: Diagnosis not present

## 2017-01-19 DIAGNOSIS — R251 Tremor, unspecified: Secondary | ICD-10-CM | POA: Diagnosis not present

## 2017-01-19 DIAGNOSIS — E1151 Type 2 diabetes mellitus with diabetic peripheral angiopathy without gangrene: Secondary | ICD-10-CM | POA: Diagnosis not present

## 2017-01-19 DIAGNOSIS — I509 Heart failure, unspecified: Secondary | ICD-10-CM | POA: Diagnosis not present

## 2017-01-19 DIAGNOSIS — N183 Chronic kidney disease, stage 3 (moderate): Secondary | ICD-10-CM | POA: Diagnosis not present

## 2017-01-19 DIAGNOSIS — I110 Hypertensive heart disease with heart failure: Secondary | ICD-10-CM | POA: Diagnosis not present

## 2017-01-19 DIAGNOSIS — Z79891 Long term (current) use of opiate analgesic: Secondary | ICD-10-CM | POA: Diagnosis not present

## 2017-01-19 DIAGNOSIS — E1122 Type 2 diabetes mellitus with diabetic chronic kidney disease: Secondary | ICD-10-CM | POA: Diagnosis not present

## 2017-01-19 DIAGNOSIS — M48061 Spinal stenosis, lumbar region without neurogenic claudication: Secondary | ICD-10-CM | POA: Diagnosis not present

## 2017-01-19 DIAGNOSIS — E1143 Type 2 diabetes mellitus with diabetic autonomic (poly)neuropathy: Secondary | ICD-10-CM | POA: Diagnosis not present

## 2017-01-19 DIAGNOSIS — R296 Repeated falls: Secondary | ICD-10-CM | POA: Diagnosis not present

## 2017-01-19 DIAGNOSIS — M170 Bilateral primary osteoarthritis of knee: Secondary | ICD-10-CM | POA: Diagnosis not present

## 2017-01-20 ENCOUNTER — Other Ambulatory Visit: Payer: Self-pay | Admitting: *Deleted

## 2017-01-20 DIAGNOSIS — N182 Chronic kidney disease, stage 2 (mild): Principal | ICD-10-CM

## 2017-01-20 DIAGNOSIS — Z794 Long term (current) use of insulin: Principal | ICD-10-CM

## 2017-01-20 DIAGNOSIS — E1122 Type 2 diabetes mellitus with diabetic chronic kidney disease: Secondary | ICD-10-CM

## 2017-01-20 DIAGNOSIS — IMO0002 Reserved for concepts with insufficient information to code with codable children: Secondary | ICD-10-CM

## 2017-01-20 DIAGNOSIS — E1165 Type 2 diabetes mellitus with hyperglycemia: Principal | ICD-10-CM

## 2017-01-20 MED ORDER — INSULIN LISPRO 100 UNIT/ML (KWIKPEN): PEN_INJECTOR | SUBCUTANEOUS | 3 refills | Status: DC

## 2017-01-20 NOTE — Telephone Encounter (Signed)
[place] with [employer] called and needed clarification on Rx. Stated that it had to say per sliding scale with the extra 3 units per meal. Printed and sent

## 2017-01-23 ENCOUNTER — Encounter: Payer: Self-pay | Admitting: Nurse Practitioner

## 2017-01-23 ENCOUNTER — Ambulatory Visit (INDEPENDENT_AMBULATORY_CARE_PROVIDER_SITE_OTHER): Payer: Medicare Other | Admitting: Nurse Practitioner

## 2017-01-23 VITALS — BP 156/62 | HR 58 | Temp 97.5°F | Ht 65.0 in | Wt 139.2 lb

## 2017-01-23 DIAGNOSIS — B379 Candidiasis, unspecified: Secondary | ICD-10-CM | POA: Diagnosis not present

## 2017-01-23 DIAGNOSIS — M170 Bilateral primary osteoarthritis of knee: Secondary | ICD-10-CM | POA: Diagnosis not present

## 2017-01-23 DIAGNOSIS — L03116 Cellulitis of left lower limb: Secondary | ICD-10-CM

## 2017-01-23 DIAGNOSIS — H60502 Unspecified acute noninfective otitis externa, left ear: Secondary | ICD-10-CM

## 2017-01-23 MED ORDER — FLUCONAZOLE 150 MG PO TABS: 150.0000 mg | ORAL_TABLET | Freq: Once | ORAL | 0 refills | Status: AC

## 2017-01-23 MED ORDER — CEPHALEXIN 500 MG PO CAPS: 500.0000 mg | ORAL_CAPSULE | Freq: Three times a day (TID) | ORAL | 0 refills | Status: DC

## 2017-01-23 NOTE — Progress Notes (Signed)
Careteam: Patient Care Team: Gayland Curry, DO as PCP - General (Geriatric Medicine) Leia Alf, DPM (Inactive) as Attending Physician (Podiatry) Marchia Bond, MD as Consulting Physician (Orthopedic Surgery)  Advanced Directive information    Allergies  Allergen Reactions  . Celebrex [Celecoxib] Rash  . Doxycycline Rash  . Lyrica [Pregabalin] Rash  . Avandia [Rosiglitazone]   . Macrodantin [Nitrofurantoin Macrocrystal]   . Sulfa Antibiotics   . Vioxx [Rofecoxib]   . Silicone Rash    Gets rash from the touch it.    Chief Complaint  Patient presents with  . Medical Management of Chronic Issues    follow up on leg and ear     HPI: Patient is a 83 y.o. female seen in the office today to follow up leg and ear. Pt was treated for otitis externa due to painful left ear. Has been on cortisporin drops and ear is feeling much better. No pain or drainage. Left leg redness has improved but still red. Has another day of antibiotics.  Went to ortho who would not give her shot for knee due to redness to left lower leg. Will see her back in 2 weeks to re-check.   Review of Systems:  Review of Systems  Constitutional: Negative for chills, fever and malaise/fatigue.  HENT: Negative for ear pain.   Respiratory: Negative for cough and shortness of breath.   Cardiovascular: Positive for leg swelling.  Musculoskeletal: Positive for joint pain and myalgias.       Chronic bilateral leg pain. Has improved to left leg but still painful. Was unable to get knee injection due to redness.   Skin:       Redness, swelling and pain to Left LE  Neurological: Negative for weakness.    Past Medical History:  Diagnosis Date  . A-fib ([place])   . Abnormal involuntary movements(781.0)   . Anemia   . Anxiety   . Anxiety state, unspecified   . Arthritis    body, neck  . Benign hypertensive heart disease   . Benign hypertensive heart disease with congestive heart failure ([place])   .  Bladder disorder    over active  . Cancer ([place])    skin, removed  . Cervicalgia   . CHF (congestive heart failure) ([place])   . Chronic kidney disease, stage III (moderate)   . Congestive heart failure, unspecified   . Contact dermatitis and other eczema, due to unspecified cause   . Corns and callosities   . Diarrhea   . Dyspnea 02/18/2015  . Edema   . Esophageal stricture   . Gastroparesis   . Gastroparesis   . GERD (gastroesophageal reflux disease)   . Hiatal hernia   . Hyperlipidemia   . Inflamed seborrheic keratosis   . Inflammatory disease of breast   . Irregular heartbeat   . Lumbago   . Mixed incontinence urge and stress (female)(female)   . Nausea alone   . Nonspecific (abnormal) findings on radiological and other examination of skull and head   . Osteoarthrosis, unspecified whether generalized or localized, unspecified site   . Other and unspecified hyperlipidemia   . Other B-complex deficiencies   . Other functional disorder of bladder   . Other malaise and fatigue   . Other specified visual disturbances   . Pain in joint, lower leg   . Palpitations   . Peripheral vascular disease, unspecified ([place])   . Postmenopausal atrophic vaginitis   . Reflux esophagitis   . Seborrheic  keratosis   . Sleep apnea   . Spasm of muscle   . Spinal stenosis   . Spinal stenosis, unspecified region other than cervical   . Stricture and stenosis of esophagus   . Stroke ([place])   . TIA (transient ischemic attack) 2012  . Transient ischemic attack (TIA), and cerebral infarction without residual deficits(V12.54)   . Type II or unspecified type diabetes mellitus with renal manifestations, not stated as uncontrolled(250.40)   . Unspecified constipation   . Unspecified essential hypertension   . Unspecified hereditary and idiopathic peripheral neuropathy   . Unspecified hypothyroidism   . Unspecified pruritic disorder   . Unspecified sleep apnea   . Unspecified vitamin D deficiency   .  Urinary tract infection, site not specified   . Vitamin B12 deficiency    Past Surgical History:  Procedure Laterality Date  . ABDOMINAL HYSTERECTOMY    . BACK SURGERY     x 2  . CHOLECYSTECTOMY  1991  . esophageal  2004,2006   dilation, Dr Lyla Son  . [place]   bilateral cataract  . hammer toes    . HERNIA REPAIR  1984aaaaaaaa  . SKIN CANCER DESTRUCTION    . THYROIDECTOMY, PARTIAL  1965   Social History:   reports that she has never smoked. She has never used smokeless tobacco. She reports that she does not drink alcohol or use drugs.  Family History  Problem Relation Age of Onset  . Cancer Mother        ? stomach  . Heart disease Father   . Hyperlipidemia Father   . Hypertension Father   . Heart attack Father   . Heart disease Brother   . Kidney disease Daughter   . Heart disease Brother   . Cancer Son     Medications: Patient's Medications  New Prescriptions   No medications on file  Previous Medications   ALPRAZOLAM (XANAX) 0.5 MG TABLET    TAKE ONE TABLET BY MOUTH THREE TIMES DAILY AS NEEDED FOR ANXIETY OR SLEEP   ASPIRIN 325 MG EC TABLET    Take 325 mg by mouth daily.   CEPHALEXIN (KEFLEX) 500 MG CAPSULE    Take 1 capsule (500 mg total) by mouth 3 (three) times daily.   CRANBERRY PO    Take 300 mg by mouth daily.   [facility] NATURAL LUTEIN EYE HEALTH PO    Take by mouth. One tablet once daily for eyes   DIGOXIN (LANOXIN) 0.125 MG TABLET    TAKE 1 TABLET BY MOUTH ON MONDAY, WEDNESDAY, FRIDAY AND SATURDAY   DIPHENHYDRAMINE-ZINC ACETATE (BENADRYL ITCH STOPPING) CREAM    Apply 1 application topically 3 (three) times daily as needed.   FUROSEMIDE (LASIX) 40 MG TABLET    One daily to control edema   GABAPENTIN (NEURONTIN) 100 MG CAPSULE    Take 2 capsules by mouth at bedtime to help tremors.   HYDROCODONE-ACETAMINOPHEN (NORCO/VICODIN) 5-325 MG TABLET    TAKE 1 TABLET BY MOUTH 4 TIMES A DAY AS NEEDED FOR PAIN.   INSULIN GLARGINE (LANTUS) 100 UNIT/ML  INJECTION    Inject 0.42 mLs (42 Units total) into the skin at bedtime.   INSULIN LISPRO (HUMALOG KWIKPEN) 100 UNIT/ML KIWKPEN    Inject 10 units before breakfast, 12 units before lunch and 14 units before suppler to control sugar. May use up to 3 more units per per meal as needed per sliding scale max units 45   INSULIN PEN NEEDLE (B-D  ULTRAFINE III SHORT PEN) 31G X 8 MM MISC    Use with administering insulin. Dx. E11.29   LEVOTHYROXINE (SYNTHROID, LEVOTHROID) 125 MCG TABLET    TAKE 1 TABLET BY MOUTH EVERY MORNING 30 MINUTES BEFORE BREAKFAST.   METOPROLOL (LOPRESSOR) 50 MG TABLET    TAKE 1 TABLET BY MOUTH TWICE A DAY TO CONTROL HEART RATE   MULTIPLE VITAMINS-MINERALS (PRESERVISION AREDS 2 PO)    Take by mouth. 1 by mouth two times daily   NEOMYCIN-POLYMYXIN-HYDROCORTISONE (CORTISPORIN) OTIC SOLUTION    Place 4 drops into the left ear 4 (four) times daily.   NITROGLYCERIN (NITROSTAT) 0.4 MG SL TABLET    Place 1 tablet (0.4 mg total) under the tongue every 5 (five) minutes as needed for chest pain.   OMEPRAZOLE (PRILOSEC) 40 MG CAPSULE    TAKE ONE (1) CAPSULE BY MOUTH 2 TIMES DAILY FOR STOMACH   ONDANSETRON (ZOFRAN) 4 MG TABLET    TAKE 1 TABLET EVERY 4 HOURS AS NEEDED FOR NAUSEA/VOMITING   ONE TOUCH ULTRA TEST TEST STRIP    Use to test sugar three times daily dx E11.29   POLYETHYL GLYCOL-PROPYL GLYCOL (SYSTANE OP)    Apply to eye. One drop both eyes twice daily for dry eyes   TRIMETHOPRIM (TRIMPEX) 100 MG TABLET    TAKE ONE TABLET BY MOUTH EVERY NIGHT AT BEDTIME TO PREVENT BLADDER INFECTION   VITAMIN D, ERGOCALCIFEROL, (DRISDOL) 50000 UNITS CAPS CAPSULE    TAKE 1 CAPSULE BY MOUTH ONCE A WEEK   VITAMIN E PO    Take 400 Units by mouth daily.  Modified Medications   No medications on file  Discontinued Medications   No medications on file     Physical Exam:  Vitals:   01/23/17 1300  BP: (!) 156/62  Pulse: (!) 58  Temp: 97.5 F (36.4 C)  TempSrc: Oral  SpO2: 94%  Weight: 139 lb 3.2 oz  (63.1 kg)  Height: 5\' 5"  (1.651 m)   Body mass index is 23.16 kg/m.  Physical Exam  Constitutional: She is oriented to person, place, and time. She appears well-developed and well-nourished. No distress.  HENT:  Right Ear: External ear normal. There is tenderness.  Left Ear: External ear normal.  Cardiovascular: Normal rate, regular rhythm, normal heart sounds and intact distal pulses.   Pulmonary/Chest: Effort normal and breath sounds normal. No respiratory distress.  Musculoskeletal: Normal range of motion.  Neurological: She is alert and oriented to person, place, and time. No cranial nerve deficit.  Skin: Skin is warm and dry. Capillary refill takes less than 2 seconds.  Erythema, swelling, tenderness and warmth noted to left foot, ankle and leg-- this has improved.  Psychiatric: She has a normal mood and affect.    Labs reviewed: Basic Metabolic Panel:  Recent Labs  07/18/16 0907 09/15/16 0901 12/19/16 0900  NA 141 138 141  K 4.6 4.8 4.9  CL 104 100 104  CO2 31 29 26   GLUCOSE 100* 94 187*  BUN 27* 36* 26*  CREATININE 1.42* 1.78* 1.43*  CALCIUM 8.8 9.0 8.8  TSH 1.46 4.27 1.48   Liver Function Tests:  Recent Labs  07/18/16 0907 09/15/16 0901 12/19/16 0900  AST 25 28 17   ALT 21 28 18   ALKPHOS 97 100 115  BILITOT 0.5 0.4 0.6  PROT 6.5 6.7 6.5  ALBUMIN 4.1 3.9 4.0   No results for input(s): LIPASE, AMYLASE in the last 8760 hours. No results for input(s): AMMONIA in the last 8760 hours. CBC:  Recent Labs  05/04/16 2132 01/18/17 1026  WBC  --  10.9*  NEUTROABS  --  7,630  HGB 12.6 11.2*  HCT 37.0 34.7*  MCV  --  94.3  PLT  --  242   Lipid Panel:  Recent Labs  05/11/16 0909 09/15/16 0901  CHOL 147 159  HDL 27* 27*  LDLCALC 92 95  TRIG 140 186*  CHOLHDL 5.4* 5.9*   TSH:  Recent Labs  07/18/16 0907 09/15/16 0901 12/19/16 0900  TSH 1.46 4.27 1.48   A1C: Lab Results  Component Value Date   HGBA1C 6.5 (H) 12/19/2016      Assessment/Plan 1. Cellulitis of left lower extremity Has improved overall but still with redness and swelling. Will cont keflex for a total of 10 days.  - cephALEXin (KEFLEX) 500 MG capsule; Take 1 capsule (500 mg total) by mouth 3 (three) times daily.  Dispense: 9 capsule; Refill: 0  2. Acute otitis externa of left ear, unspecified type -improved  3. Yeast infection -pt reports frequent yeast infection with antibiotic, currently without problem but fears this will be an issue -Rx  fluconazole (DIFLUCAN) 150 MG tablet; Take 1 tablet (150 mg total) by mouth once.  Dispense: 1 tablet; Refill: 0 provided to take if needed   To keep follow up with Dr Mariea Clonts, if redness, swelling worsens or fails to improve to call office for appt   Montrey Buist K. Harle Battiest  [facility] [phone] 8 am - 5 pm) [phone] (after hours)

## 2017-01-23 NOTE — Patient Instructions (Addendum)
Cont keflex for total of 10 days (3 more days sent to pharmacy) Diflucan sent to pharmacy if needed for yeast Notify if redness persist or worsen after you stop antibiotic  Cont to take probiotic twice daily

## 2017-01-24 DIAGNOSIS — M170 Bilateral primary osteoarthritis of knee: Secondary | ICD-10-CM | POA: Diagnosis not present

## 2017-01-24 DIAGNOSIS — E1151 Type 2 diabetes mellitus with diabetic peripheral angiopathy without gangrene: Secondary | ICD-10-CM | POA: Diagnosis not present

## 2017-01-24 DIAGNOSIS — Z7982 Long term (current) use of aspirin: Secondary | ICD-10-CM | POA: Diagnosis not present

## 2017-01-24 DIAGNOSIS — I110 Hypertensive heart disease with heart failure: Secondary | ICD-10-CM | POA: Diagnosis not present

## 2017-01-24 DIAGNOSIS — R251 Tremor, unspecified: Secondary | ICD-10-CM | POA: Diagnosis not present

## 2017-01-24 DIAGNOSIS — M48061 Spinal stenosis, lumbar region without neurogenic claudication: Secondary | ICD-10-CM | POA: Diagnosis not present

## 2017-01-24 DIAGNOSIS — Z79891 Long term (current) use of opiate analgesic: Secondary | ICD-10-CM | POA: Diagnosis not present

## 2017-01-24 DIAGNOSIS — E1143 Type 2 diabetes mellitus with diabetic autonomic (poly)neuropathy: Secondary | ICD-10-CM | POA: Diagnosis not present

## 2017-01-24 DIAGNOSIS — N183 Chronic kidney disease, stage 3 (moderate): Secondary | ICD-10-CM | POA: Diagnosis not present

## 2017-01-24 DIAGNOSIS — R296 Repeated falls: Secondary | ICD-10-CM | POA: Diagnosis not present

## 2017-01-24 DIAGNOSIS — I509 Heart failure, unspecified: Secondary | ICD-10-CM | POA: Diagnosis not present

## 2017-01-24 DIAGNOSIS — E1122 Type 2 diabetes mellitus with diabetic chronic kidney disease: Secondary | ICD-10-CM | POA: Diagnosis not present

## 2017-01-24 DIAGNOSIS — Z794 Long term (current) use of insulin: Secondary | ICD-10-CM | POA: Diagnosis not present

## 2017-01-25 NOTE — Telephone Encounter (Signed)
Forms were received from [facility] Patient Assistance program stating that patient DOES meet program eligibility criteria and is enrolled in [place] until the end of the calendar year.   Patient was called and form was given to Karen [name] to add to already completed paperwork for scanning.

## 2017-01-26 DIAGNOSIS — M48061 Spinal stenosis, lumbar region without neurogenic claudication: Secondary | ICD-10-CM | POA: Diagnosis not present

## 2017-01-26 DIAGNOSIS — E1143 Type 2 diabetes mellitus with diabetic autonomic (poly)neuropathy: Secondary | ICD-10-CM | POA: Diagnosis not present

## 2017-01-26 DIAGNOSIS — E1122 Type 2 diabetes mellitus with diabetic chronic kidney disease: Secondary | ICD-10-CM | POA: Diagnosis not present

## 2017-01-26 DIAGNOSIS — R296 Repeated falls: Secondary | ICD-10-CM | POA: Diagnosis not present

## 2017-01-26 DIAGNOSIS — M170 Bilateral primary osteoarthritis of knee: Secondary | ICD-10-CM | POA: Diagnosis not present

## 2017-01-26 DIAGNOSIS — N183 Chronic kidney disease, stage 3 (moderate): Secondary | ICD-10-CM | POA: Diagnosis not present

## 2017-01-26 DIAGNOSIS — I110 Hypertensive heart disease with heart failure: Secondary | ICD-10-CM | POA: Diagnosis not present

## 2017-01-26 DIAGNOSIS — E1151 Type 2 diabetes mellitus with diabetic peripheral angiopathy without gangrene: Secondary | ICD-10-CM | POA: Diagnosis not present

## 2017-01-26 DIAGNOSIS — R251 Tremor, unspecified: Secondary | ICD-10-CM | POA: Diagnosis not present

## 2017-01-26 DIAGNOSIS — Z794 Long term (current) use of insulin: Secondary | ICD-10-CM | POA: Diagnosis not present

## 2017-01-26 DIAGNOSIS — Z79891 Long term (current) use of opiate analgesic: Secondary | ICD-10-CM | POA: Diagnosis not present

## 2017-01-26 DIAGNOSIS — Z7982 Long term (current) use of aspirin: Secondary | ICD-10-CM | POA: Diagnosis not present

## 2017-01-26 DIAGNOSIS — I509 Heart failure, unspecified: Secondary | ICD-10-CM | POA: Diagnosis not present

## 2017-01-30 DIAGNOSIS — R251 Tremor, unspecified: Secondary | ICD-10-CM | POA: Diagnosis not present

## 2017-01-30 DIAGNOSIS — Z79891 Long term (current) use of opiate analgesic: Secondary | ICD-10-CM | POA: Diagnosis not present

## 2017-01-30 DIAGNOSIS — E1122 Type 2 diabetes mellitus with diabetic chronic kidney disease: Secondary | ICD-10-CM | POA: Diagnosis not present

## 2017-01-30 DIAGNOSIS — Z794 Long term (current) use of insulin: Secondary | ICD-10-CM | POA: Diagnosis not present

## 2017-01-30 DIAGNOSIS — I509 Heart failure, unspecified: Secondary | ICD-10-CM | POA: Diagnosis not present

## 2017-01-30 DIAGNOSIS — E1143 Type 2 diabetes mellitus with diabetic autonomic (poly)neuropathy: Secondary | ICD-10-CM | POA: Diagnosis not present

## 2017-01-30 DIAGNOSIS — E1151 Type 2 diabetes mellitus with diabetic peripheral angiopathy without gangrene: Secondary | ICD-10-CM | POA: Diagnosis not present

## 2017-01-30 DIAGNOSIS — N183 Chronic kidney disease, stage 3 (moderate): Secondary | ICD-10-CM | POA: Diagnosis not present

## 2017-01-30 DIAGNOSIS — R296 Repeated falls: Secondary | ICD-10-CM | POA: Diagnosis not present

## 2017-01-30 DIAGNOSIS — M170 Bilateral primary osteoarthritis of knee: Secondary | ICD-10-CM | POA: Diagnosis not present

## 2017-01-30 DIAGNOSIS — I110 Hypertensive heart disease with heart failure: Secondary | ICD-10-CM | POA: Diagnosis not present

## 2017-01-30 DIAGNOSIS — Z7982 Long term (current) use of aspirin: Secondary | ICD-10-CM | POA: Diagnosis not present

## 2017-01-30 DIAGNOSIS — M48061 Spinal stenosis, lumbar region without neurogenic claudication: Secondary | ICD-10-CM | POA: Diagnosis not present

## 2017-01-31 DIAGNOSIS — E1143 Type 2 diabetes mellitus with diabetic autonomic (poly)neuropathy: Secondary | ICD-10-CM | POA: Diagnosis not present

## 2017-01-31 DIAGNOSIS — E1151 Type 2 diabetes mellitus with diabetic peripheral angiopathy without gangrene: Secondary | ICD-10-CM | POA: Diagnosis not present

## 2017-01-31 DIAGNOSIS — I509 Heart failure, unspecified: Secondary | ICD-10-CM | POA: Diagnosis not present

## 2017-01-31 DIAGNOSIS — I110 Hypertensive heart disease with heart failure: Secondary | ICD-10-CM | POA: Diagnosis not present

## 2017-01-31 DIAGNOSIS — N183 Chronic kidney disease, stage 3 (moderate): Secondary | ICD-10-CM | POA: Diagnosis not present

## 2017-01-31 DIAGNOSIS — Z7982 Long term (current) use of aspirin: Secondary | ICD-10-CM | POA: Diagnosis not present

## 2017-01-31 DIAGNOSIS — M170 Bilateral primary osteoarthritis of knee: Secondary | ICD-10-CM | POA: Diagnosis not present

## 2017-01-31 DIAGNOSIS — Z794 Long term (current) use of insulin: Secondary | ICD-10-CM | POA: Diagnosis not present

## 2017-01-31 DIAGNOSIS — Z79891 Long term (current) use of opiate analgesic: Secondary | ICD-10-CM | POA: Diagnosis not present

## 2017-01-31 DIAGNOSIS — R251 Tremor, unspecified: Secondary | ICD-10-CM | POA: Diagnosis not present

## 2017-01-31 DIAGNOSIS — E1122 Type 2 diabetes mellitus with diabetic chronic kidney disease: Secondary | ICD-10-CM | POA: Diagnosis not present

## 2017-01-31 DIAGNOSIS — M48061 Spinal stenosis, lumbar region without neurogenic claudication: Secondary | ICD-10-CM | POA: Diagnosis not present

## 2017-01-31 DIAGNOSIS — R296 Repeated falls: Secondary | ICD-10-CM | POA: Diagnosis not present

## 2017-02-02 ENCOUNTER — Other Ambulatory Visit: Payer: Self-pay | Admitting: Internal Medicine

## 2017-02-02 ENCOUNTER — Other Ambulatory Visit: Payer: Self-pay | Admitting: Nurse Practitioner

## 2017-02-02 DIAGNOSIS — I509 Heart failure, unspecified: Secondary | ICD-10-CM | POA: Diagnosis not present

## 2017-02-02 DIAGNOSIS — Z79891 Long term (current) use of opiate analgesic: Secondary | ICD-10-CM | POA: Diagnosis not present

## 2017-02-02 DIAGNOSIS — R296 Repeated falls: Secondary | ICD-10-CM | POA: Diagnosis not present

## 2017-02-02 DIAGNOSIS — M48061 Spinal stenosis, lumbar region without neurogenic claudication: Secondary | ICD-10-CM | POA: Diagnosis not present

## 2017-02-02 DIAGNOSIS — F419 Anxiety disorder, unspecified: Secondary | ICD-10-CM

## 2017-02-02 DIAGNOSIS — E1143 Type 2 diabetes mellitus with diabetic autonomic (poly)neuropathy: Secondary | ICD-10-CM | POA: Diagnosis not present

## 2017-02-02 DIAGNOSIS — E1151 Type 2 diabetes mellitus with diabetic peripheral angiopathy without gangrene: Secondary | ICD-10-CM | POA: Diagnosis not present

## 2017-02-02 DIAGNOSIS — Z7982 Long term (current) use of aspirin: Secondary | ICD-10-CM | POA: Diagnosis not present

## 2017-02-02 DIAGNOSIS — N183 Chronic kidney disease, stage 3 (moderate): Secondary | ICD-10-CM | POA: Diagnosis not present

## 2017-02-02 DIAGNOSIS — E1122 Type 2 diabetes mellitus with diabetic chronic kidney disease: Secondary | ICD-10-CM | POA: Diagnosis not present

## 2017-02-02 DIAGNOSIS — R251 Tremor, unspecified: Secondary | ICD-10-CM | POA: Diagnosis not present

## 2017-02-02 DIAGNOSIS — Z794 Long term (current) use of insulin: Secondary | ICD-10-CM | POA: Diagnosis not present

## 2017-02-02 DIAGNOSIS — I110 Hypertensive heart disease with heart failure: Secondary | ICD-10-CM | POA: Diagnosis not present

## 2017-02-02 DIAGNOSIS — M170 Bilateral primary osteoarthritis of knee: Secondary | ICD-10-CM | POA: Diagnosis not present

## 2017-02-02 MED ORDER — HYDROCODONE-ACETAMINOPHEN 5-325 MG PO TABS: 1.0000 | ORAL_TABLET | Freq: Four times a day (QID) | ORAL | 0 refills | Status: DC | PRN

## 2017-02-03 ENCOUNTER — Telehealth: Payer: Self-pay

## 2017-02-03 NOTE — Telephone Encounter (Signed)
Received a fax from the pharmacy stating she has requested a compound cream. Will try to call back later.

## 2017-02-06 DIAGNOSIS — M170 Bilateral primary osteoarthritis of knee: Secondary | ICD-10-CM | POA: Diagnosis not present

## 2017-02-06 NOTE — Telephone Encounter (Signed)
Spoke with patient about a form faxed from Pharmacy about creams, (nothing is marked on the form for which cream). Patient said that she has been telling him she didn't want any of the creams. Told her I would fax it back with a note that you do not want any creams.  Also told patient that her Humalog Kwikpen insulin was here from [place] and she could pick it up.

## 2017-02-08 DIAGNOSIS — M48061 Spinal stenosis, lumbar region without neurogenic claudication: Secondary | ICD-10-CM | POA: Diagnosis not present

## 2017-02-08 DIAGNOSIS — E1143 Type 2 diabetes mellitus with diabetic autonomic (poly)neuropathy: Secondary | ICD-10-CM | POA: Diagnosis not present

## 2017-02-08 DIAGNOSIS — N183 Chronic kidney disease, stage 3 (moderate): Secondary | ICD-10-CM | POA: Diagnosis not present

## 2017-02-08 DIAGNOSIS — E1151 Type 2 diabetes mellitus with diabetic peripheral angiopathy without gangrene: Secondary | ICD-10-CM | POA: Diagnosis not present

## 2017-02-08 DIAGNOSIS — M1711 Unilateral primary osteoarthritis, right knee: Secondary | ICD-10-CM | POA: Diagnosis not present

## 2017-02-08 DIAGNOSIS — R251 Tremor, unspecified: Secondary | ICD-10-CM | POA: Diagnosis not present

## 2017-02-08 DIAGNOSIS — I110 Hypertensive heart disease with heart failure: Secondary | ICD-10-CM | POA: Diagnosis not present

## 2017-02-08 DIAGNOSIS — Z794 Long term (current) use of insulin: Secondary | ICD-10-CM | POA: Diagnosis not present

## 2017-02-08 DIAGNOSIS — I509 Heart failure, unspecified: Secondary | ICD-10-CM | POA: Diagnosis not present

## 2017-02-08 DIAGNOSIS — M170 Bilateral primary osteoarthritis of knee: Secondary | ICD-10-CM | POA: Diagnosis not present

## 2017-02-08 DIAGNOSIS — Z7982 Long term (current) use of aspirin: Secondary | ICD-10-CM | POA: Diagnosis not present

## 2017-02-08 DIAGNOSIS — E1122 Type 2 diabetes mellitus with diabetic chronic kidney disease: Secondary | ICD-10-CM | POA: Diagnosis not present

## 2017-02-08 DIAGNOSIS — M1712 Unilateral primary osteoarthritis, left knee: Secondary | ICD-10-CM | POA: Diagnosis not present

## 2017-02-08 DIAGNOSIS — Z79891 Long term (current) use of opiate analgesic: Secondary | ICD-10-CM | POA: Diagnosis not present

## 2017-02-08 DIAGNOSIS — R296 Repeated falls: Secondary | ICD-10-CM | POA: Diagnosis not present

## 2017-02-09 DIAGNOSIS — I509 Heart failure, unspecified: Secondary | ICD-10-CM | POA: Diagnosis not present

## 2017-02-09 DIAGNOSIS — Z7982 Long term (current) use of aspirin: Secondary | ICD-10-CM | POA: Diagnosis not present

## 2017-02-09 DIAGNOSIS — I110 Hypertensive heart disease with heart failure: Secondary | ICD-10-CM | POA: Diagnosis not present

## 2017-02-09 DIAGNOSIS — E1151 Type 2 diabetes mellitus with diabetic peripheral angiopathy without gangrene: Secondary | ICD-10-CM | POA: Diagnosis not present

## 2017-02-09 DIAGNOSIS — Z794 Long term (current) use of insulin: Secondary | ICD-10-CM | POA: Diagnosis not present

## 2017-02-09 DIAGNOSIS — N183 Chronic kidney disease, stage 3 (moderate): Secondary | ICD-10-CM | POA: Diagnosis not present

## 2017-02-09 DIAGNOSIS — M48061 Spinal stenosis, lumbar region without neurogenic claudication: Secondary | ICD-10-CM | POA: Diagnosis not present

## 2017-02-09 DIAGNOSIS — R296 Repeated falls: Secondary | ICD-10-CM | POA: Diagnosis not present

## 2017-02-09 DIAGNOSIS — Z79891 Long term (current) use of opiate analgesic: Secondary | ICD-10-CM | POA: Diagnosis not present

## 2017-02-09 DIAGNOSIS — M170 Bilateral primary osteoarthritis of knee: Secondary | ICD-10-CM | POA: Diagnosis not present

## 2017-02-09 DIAGNOSIS — E1122 Type 2 diabetes mellitus with diabetic chronic kidney disease: Secondary | ICD-10-CM | POA: Diagnosis not present

## 2017-02-09 DIAGNOSIS — R251 Tremor, unspecified: Secondary | ICD-10-CM | POA: Diagnosis not present

## 2017-02-09 DIAGNOSIS — E1143 Type 2 diabetes mellitus with diabetic autonomic (poly)neuropathy: Secondary | ICD-10-CM | POA: Diagnosis not present

## 2017-02-14 DIAGNOSIS — I110 Hypertensive heart disease with heart failure: Secondary | ICD-10-CM | POA: Diagnosis not present

## 2017-02-14 DIAGNOSIS — R251 Tremor, unspecified: Secondary | ICD-10-CM | POA: Diagnosis not present

## 2017-02-14 DIAGNOSIS — I509 Heart failure, unspecified: Secondary | ICD-10-CM | POA: Diagnosis not present

## 2017-02-14 DIAGNOSIS — E1151 Type 2 diabetes mellitus with diabetic peripheral angiopathy without gangrene: Secondary | ICD-10-CM | POA: Diagnosis not present

## 2017-02-14 DIAGNOSIS — M170 Bilateral primary osteoarthritis of knee: Secondary | ICD-10-CM | POA: Diagnosis not present

## 2017-02-14 DIAGNOSIS — M48061 Spinal stenosis, lumbar region without neurogenic claudication: Secondary | ICD-10-CM | POA: Diagnosis not present

## 2017-02-14 DIAGNOSIS — R296 Repeated falls: Secondary | ICD-10-CM | POA: Diagnosis not present

## 2017-02-14 DIAGNOSIS — N183 Chronic kidney disease, stage 3 (moderate): Secondary | ICD-10-CM | POA: Diagnosis not present

## 2017-02-14 DIAGNOSIS — E1143 Type 2 diabetes mellitus with diabetic autonomic (poly)neuropathy: Secondary | ICD-10-CM | POA: Diagnosis not present

## 2017-02-14 DIAGNOSIS — Z7982 Long term (current) use of aspirin: Secondary | ICD-10-CM | POA: Diagnosis not present

## 2017-02-14 DIAGNOSIS — Z794 Long term (current) use of insulin: Secondary | ICD-10-CM | POA: Diagnosis not present

## 2017-02-14 DIAGNOSIS — Z79891 Long term (current) use of opiate analgesic: Secondary | ICD-10-CM | POA: Diagnosis not present

## 2017-02-14 DIAGNOSIS — E1122 Type 2 diabetes mellitus with diabetic chronic kidney disease: Secondary | ICD-10-CM | POA: Diagnosis not present

## 2017-02-15 DIAGNOSIS — M170 Bilateral primary osteoarthritis of knee: Secondary | ICD-10-CM | POA: Diagnosis not present

## 2017-02-16 DIAGNOSIS — I509 Heart failure, unspecified: Secondary | ICD-10-CM | POA: Diagnosis not present

## 2017-02-16 DIAGNOSIS — Z794 Long term (current) use of insulin: Secondary | ICD-10-CM | POA: Diagnosis not present

## 2017-02-16 DIAGNOSIS — R296 Repeated falls: Secondary | ICD-10-CM | POA: Diagnosis not present

## 2017-02-16 DIAGNOSIS — M170 Bilateral primary osteoarthritis of knee: Secondary | ICD-10-CM | POA: Diagnosis not present

## 2017-02-16 DIAGNOSIS — E1122 Type 2 diabetes mellitus with diabetic chronic kidney disease: Secondary | ICD-10-CM | POA: Diagnosis not present

## 2017-02-16 DIAGNOSIS — E1143 Type 2 diabetes mellitus with diabetic autonomic (poly)neuropathy: Secondary | ICD-10-CM | POA: Diagnosis not present

## 2017-02-16 DIAGNOSIS — Z7982 Long term (current) use of aspirin: Secondary | ICD-10-CM | POA: Diagnosis not present

## 2017-02-16 DIAGNOSIS — M48061 Spinal stenosis, lumbar region without neurogenic claudication: Secondary | ICD-10-CM | POA: Diagnosis not present

## 2017-02-16 DIAGNOSIS — N183 Chronic kidney disease, stage 3 (moderate): Secondary | ICD-10-CM | POA: Diagnosis not present

## 2017-02-16 DIAGNOSIS — R251 Tremor, unspecified: Secondary | ICD-10-CM | POA: Diagnosis not present

## 2017-02-16 DIAGNOSIS — Z79891 Long term (current) use of opiate analgesic: Secondary | ICD-10-CM | POA: Diagnosis not present

## 2017-02-16 DIAGNOSIS — E1151 Type 2 diabetes mellitus with diabetic peripheral angiopathy without gangrene: Secondary | ICD-10-CM | POA: Diagnosis not present

## 2017-02-16 DIAGNOSIS — I110 Hypertensive heart disease with heart failure: Secondary | ICD-10-CM | POA: Diagnosis not present

## 2017-02-21 DIAGNOSIS — M170 Bilateral primary osteoarthritis of knee: Secondary | ICD-10-CM | POA: Diagnosis not present

## 2017-02-21 DIAGNOSIS — E1151 Type 2 diabetes mellitus with diabetic peripheral angiopathy without gangrene: Secondary | ICD-10-CM | POA: Diagnosis not present

## 2017-02-21 DIAGNOSIS — Z7982 Long term (current) use of aspirin: Secondary | ICD-10-CM | POA: Diagnosis not present

## 2017-02-21 DIAGNOSIS — R296 Repeated falls: Secondary | ICD-10-CM | POA: Diagnosis not present

## 2017-02-21 DIAGNOSIS — R251 Tremor, unspecified: Secondary | ICD-10-CM | POA: Diagnosis not present

## 2017-02-21 DIAGNOSIS — Z79891 Long term (current) use of opiate analgesic: Secondary | ICD-10-CM | POA: Diagnosis not present

## 2017-02-21 DIAGNOSIS — I110 Hypertensive heart disease with heart failure: Secondary | ICD-10-CM | POA: Diagnosis not present

## 2017-02-21 DIAGNOSIS — N183 Chronic kidney disease, stage 3 (moderate): Secondary | ICD-10-CM | POA: Diagnosis not present

## 2017-02-21 DIAGNOSIS — I509 Heart failure, unspecified: Secondary | ICD-10-CM | POA: Diagnosis not present

## 2017-02-21 DIAGNOSIS — Z794 Long term (current) use of insulin: Secondary | ICD-10-CM | POA: Diagnosis not present

## 2017-02-21 DIAGNOSIS — M48061 Spinal stenosis, lumbar region without neurogenic claudication: Secondary | ICD-10-CM | POA: Diagnosis not present

## 2017-02-21 DIAGNOSIS — E1122 Type 2 diabetes mellitus with diabetic chronic kidney disease: Secondary | ICD-10-CM | POA: Diagnosis not present

## 2017-02-21 DIAGNOSIS — E1143 Type 2 diabetes mellitus with diabetic autonomic (poly)neuropathy: Secondary | ICD-10-CM | POA: Diagnosis not present

## 2017-02-22 ENCOUNTER — Telehealth: Payer: Self-pay | Admitting: *Deleted

## 2017-02-22 NOTE — Telephone Encounter (Signed)
Debbie with [facility] called and wanted verbal orders for additional visit for recert. Due to pain not well managed. Verbal orders given per office protocol.

## 2017-02-23 DIAGNOSIS — E1143 Type 2 diabetes mellitus with diabetic autonomic (poly)neuropathy: Secondary | ICD-10-CM | POA: Diagnosis not present

## 2017-02-23 DIAGNOSIS — M48061 Spinal stenosis, lumbar region without neurogenic claudication: Secondary | ICD-10-CM | POA: Diagnosis not present

## 2017-02-23 DIAGNOSIS — E1151 Type 2 diabetes mellitus with diabetic peripheral angiopathy without gangrene: Secondary | ICD-10-CM | POA: Diagnosis not present

## 2017-02-23 DIAGNOSIS — N183 Chronic kidney disease, stage 3 (moderate): Secondary | ICD-10-CM | POA: Diagnosis not present

## 2017-02-23 DIAGNOSIS — I509 Heart failure, unspecified: Secondary | ICD-10-CM | POA: Diagnosis not present

## 2017-02-23 DIAGNOSIS — I110 Hypertensive heart disease with heart failure: Secondary | ICD-10-CM | POA: Diagnosis not present

## 2017-02-23 DIAGNOSIS — Z7982 Long term (current) use of aspirin: Secondary | ICD-10-CM | POA: Diagnosis not present

## 2017-02-23 DIAGNOSIS — R296 Repeated falls: Secondary | ICD-10-CM | POA: Diagnosis not present

## 2017-02-23 DIAGNOSIS — R251 Tremor, unspecified: Secondary | ICD-10-CM | POA: Diagnosis not present

## 2017-02-23 DIAGNOSIS — Z79891 Long term (current) use of opiate analgesic: Secondary | ICD-10-CM | POA: Diagnosis not present

## 2017-02-23 DIAGNOSIS — M170 Bilateral primary osteoarthritis of knee: Secondary | ICD-10-CM | POA: Diagnosis not present

## 2017-02-23 DIAGNOSIS — Z794 Long term (current) use of insulin: Secondary | ICD-10-CM | POA: Diagnosis not present

## 2017-02-23 DIAGNOSIS — E1122 Type 2 diabetes mellitus with diabetic chronic kidney disease: Secondary | ICD-10-CM | POA: Diagnosis not present

## 2017-02-24 ENCOUNTER — Telehealth: Payer: Self-pay | Admitting: *Deleted

## 2017-02-24 NOTE — Telephone Encounter (Signed)
Erin with [facility] called requesting verbal orders to continue PT 2X4wks for gait training and fall prevention. Verbal orders given.

## 2017-02-27 DIAGNOSIS — M170 Bilateral primary osteoarthritis of knee: Secondary | ICD-10-CM | POA: Diagnosis not present

## 2017-02-28 ENCOUNTER — Telehealth: Payer: Self-pay | Admitting: *Deleted

## 2017-02-28 DIAGNOSIS — I110 Hypertensive heart disease with heart failure: Secondary | ICD-10-CM | POA: Diagnosis not present

## 2017-02-28 DIAGNOSIS — E1143 Type 2 diabetes mellitus with diabetic autonomic (poly)neuropathy: Secondary | ICD-10-CM

## 2017-02-28 DIAGNOSIS — I509 Heart failure, unspecified: Secondary | ICD-10-CM

## 2017-02-28 DIAGNOSIS — N183 Chronic kidney disease, stage 3 (moderate): Secondary | ICD-10-CM

## 2017-02-28 DIAGNOSIS — R296 Repeated falls: Secondary | ICD-10-CM | POA: Diagnosis not present

## 2017-02-28 DIAGNOSIS — Z794 Long term (current) use of insulin: Secondary | ICD-10-CM | POA: Diagnosis not present

## 2017-02-28 DIAGNOSIS — M48061 Spinal stenosis, lumbar region without neurogenic claudication: Secondary | ICD-10-CM | POA: Diagnosis not present

## 2017-02-28 DIAGNOSIS — Z9181 History of falling: Secondary | ICD-10-CM | POA: Diagnosis not present

## 2017-02-28 DIAGNOSIS — M170 Bilateral primary osteoarthritis of knee: Secondary | ICD-10-CM | POA: Diagnosis not present

## 2017-02-28 DIAGNOSIS — E1122 Type 2 diabetes mellitus with diabetic chronic kidney disease: Secondary | ICD-10-CM

## 2017-02-28 DIAGNOSIS — Z8673 Personal history of transient ischemic attack (TIA), and cerebral infarction without residual deficits: Secondary | ICD-10-CM | POA: Diagnosis not present

## 2017-02-28 DIAGNOSIS — E1151 Type 2 diabetes mellitus with diabetic peripheral angiopathy without gangrene: Secondary | ICD-10-CM | POA: Diagnosis not present

## 2017-02-28 NOTE — Telephone Encounter (Signed)
Karen [name] with [facility] called requesting recert orders for patient for pain management.  Verbal orders were already given on 02/22/17 at 4:53pm. Reconfirmed with Karen [name] and she stated that she did remember but forgot to write them down. She also wanted to have patient's reflux medication changed to something different. Instructed her that patient would need an appointment for change of condition/medication. She agreed and stated that she will reassess patient.

## 2017-03-02 DIAGNOSIS — M48061 Spinal stenosis, lumbar region without neurogenic claudication: Secondary | ICD-10-CM | POA: Diagnosis not present

## 2017-03-02 DIAGNOSIS — R296 Repeated falls: Secondary | ICD-10-CM | POA: Diagnosis not present

## 2017-03-02 DIAGNOSIS — E1143 Type 2 diabetes mellitus with diabetic autonomic (poly)neuropathy: Secondary | ICD-10-CM | POA: Diagnosis not present

## 2017-03-02 DIAGNOSIS — E1122 Type 2 diabetes mellitus with diabetic chronic kidney disease: Secondary | ICD-10-CM | POA: Diagnosis not present

## 2017-03-02 DIAGNOSIS — Z8673 Personal history of transient ischemic attack (TIA), and cerebral infarction without residual deficits: Secondary | ICD-10-CM | POA: Diagnosis not present

## 2017-03-02 DIAGNOSIS — I509 Heart failure, unspecified: Secondary | ICD-10-CM | POA: Diagnosis not present

## 2017-03-02 DIAGNOSIS — N183 Chronic kidney disease, stage 3 (moderate): Secondary | ICD-10-CM | POA: Diagnosis not present

## 2017-03-02 DIAGNOSIS — Z794 Long term (current) use of insulin: Secondary | ICD-10-CM | POA: Diagnosis not present

## 2017-03-02 DIAGNOSIS — M170 Bilateral primary osteoarthritis of knee: Secondary | ICD-10-CM | POA: Diagnosis not present

## 2017-03-02 DIAGNOSIS — I110 Hypertensive heart disease with heart failure: Secondary | ICD-10-CM | POA: Diagnosis not present

## 2017-03-02 DIAGNOSIS — Z9181 History of falling: Secondary | ICD-10-CM | POA: Diagnosis not present

## 2017-03-02 DIAGNOSIS — E1151 Type 2 diabetes mellitus with diabetic peripheral angiopathy without gangrene: Secondary | ICD-10-CM | POA: Diagnosis not present

## 2017-03-03 ENCOUNTER — Telehealth: Payer: Self-pay | Admitting: *Deleted

## 2017-03-03 ENCOUNTER — Other Ambulatory Visit: Payer: Self-pay | Admitting: Nurse Practitioner

## 2017-03-03 DIAGNOSIS — E1151 Type 2 diabetes mellitus with diabetic peripheral angiopathy without gangrene: Secondary | ICD-10-CM | POA: Diagnosis not present

## 2017-03-03 DIAGNOSIS — E1143 Type 2 diabetes mellitus with diabetic autonomic (poly)neuropathy: Secondary | ICD-10-CM | POA: Diagnosis not present

## 2017-03-03 DIAGNOSIS — I509 Heart failure, unspecified: Secondary | ICD-10-CM | POA: Diagnosis not present

## 2017-03-03 DIAGNOSIS — Z9181 History of falling: Secondary | ICD-10-CM | POA: Diagnosis not present

## 2017-03-03 DIAGNOSIS — Z794 Long term (current) use of insulin: Secondary | ICD-10-CM | POA: Diagnosis not present

## 2017-03-03 DIAGNOSIS — Z8673 Personal history of transient ischemic attack (TIA), and cerebral infarction without residual deficits: Secondary | ICD-10-CM | POA: Diagnosis not present

## 2017-03-03 DIAGNOSIS — R296 Repeated falls: Secondary | ICD-10-CM | POA: Diagnosis not present

## 2017-03-03 DIAGNOSIS — M48061 Spinal stenosis, lumbar region without neurogenic claudication: Secondary | ICD-10-CM | POA: Diagnosis not present

## 2017-03-03 DIAGNOSIS — I110 Hypertensive heart disease with heart failure: Secondary | ICD-10-CM | POA: Diagnosis not present

## 2017-03-03 DIAGNOSIS — N183 Chronic kidney disease, stage 3 (moderate): Secondary | ICD-10-CM | POA: Diagnosis not present

## 2017-03-03 DIAGNOSIS — M170 Bilateral primary osteoarthritis of knee: Secondary | ICD-10-CM | POA: Diagnosis not present

## 2017-03-03 DIAGNOSIS — E1122 Type 2 diabetes mellitus with diabetic chronic kidney disease: Secondary | ICD-10-CM | POA: Diagnosis not present

## 2017-03-03 NOTE — Telephone Encounter (Signed)
Manuela Schwartz with [facility] called and stated that she saw patient today and had some concerns with redness and swelling in left ankle, mid calf down.  Pulses are faint and has some pain with walking.  Right leg has alittle bit of that to around the ankle.  Has had this in the past and given antibiotics for cellulitis.  T: 97.6, BP 140/78, Pulse 64, RR 20 Please Advise.

## 2017-03-03 NOTE — Telephone Encounter (Signed)
Hard to know if this is truly cellulitis or chronic venous insufficiency without seeing it myself. Pt is tender all over so it's hard to tell from that. Recommend she come in to be seen.

## 2017-03-06 ENCOUNTER — Other Ambulatory Visit: Payer: Self-pay | Admitting: Internal Medicine

## 2017-03-06 ENCOUNTER — Other Ambulatory Visit: Payer: Self-pay | Admitting: Nurse Practitioner

## 2017-03-06 ENCOUNTER — Ambulatory Visit (INDEPENDENT_AMBULATORY_CARE_PROVIDER_SITE_OTHER): Payer: Medicare Other | Admitting: Internal Medicine

## 2017-03-06 ENCOUNTER — Encounter: Payer: Self-pay | Admitting: Internal Medicine

## 2017-03-06 VITALS — BP 126/60 | HR 56 | Temp 98.1°F | Ht 65.0 in | Wt 141.0 lb

## 2017-03-06 DIAGNOSIS — E1165 Type 2 diabetes mellitus with hyperglycemia: Secondary | ICD-10-CM | POA: Insufficient documentation

## 2017-03-06 DIAGNOSIS — I872 Venous insufficiency (chronic) (peripheral): Secondary | ICD-10-CM | POA: Diagnosis not present

## 2017-03-06 DIAGNOSIS — B353 Tinea pedis: Secondary | ICD-10-CM | POA: Insufficient documentation

## 2017-03-06 DIAGNOSIS — E1140 Type 2 diabetes mellitus with diabetic neuropathy, unspecified: Secondary | ICD-10-CM | POA: Diagnosis not present

## 2017-03-06 DIAGNOSIS — E1143 Type 2 diabetes mellitus with diabetic autonomic (poly)neuropathy: Secondary | ICD-10-CM | POA: Insufficient documentation

## 2017-03-06 DIAGNOSIS — F419 Anxiety disorder, unspecified: Secondary | ICD-10-CM

## 2017-03-06 MED ORDER — CLOTRIMAZOLE-BETAMETHASONE 1-0.05 % EX CREA: 1.0000 "application " | TOPICAL_CREAM | Freq: Two times a day (BID) | CUTANEOUS | 3 refills | Status: DC

## 2017-03-06 NOTE — Telephone Encounter (Signed)
rx refilled and gave to patient at [facility] today.

## 2017-03-06 NOTE — Progress Notes (Signed)
Location:  [facility] clinic Provider: Antionne Enrique L. Mariea Clonts, D.O., C.M.D.  Code Status: full code Goals of Care:  Advanced Directives 01/18/2017  Does Patient Have a Medical Advance Directive? Yes  Type of Paramedic of [place];Living will  Does patient want to make changes to medical advance directive? -  Copy of [place] in Chart? No - copy requested  Would patient like information on creating a medical advance directive? -   Chief Complaint  Patient presents with  . Acute Visit    Bilateral redness, tingling (at night) and swelling in legs. Left leg (mid calf down into ankle) is worse. Ongoing concern x 2 months . Patient also c/o headache. Here with son [name]  . Medication Refill    Alprazolam and Hydrocodone (rx's printed and given to patient)     HPI: Patient is a 83 y.o. female seen today for an acute visit for bilateral leg redness left greater than right.  Also swelling and pain.  Most of the pain is in her toes and cramping up at night, burning.  Says she cannot wear compression hose.    Had a chest pain last night.  Calmed down and pain went away.  Did not need to take ntg.  Was nonradiating.  Lasted 30 mins.  Rates 6/10 pain.  Was a little SOB.    Has temple headache that is stabbing on right temple.  Says it's due to her other pain.  Right eye vision not affected.  Left blurry from macular degeneration.    Past Medical History:  Diagnosis Date  . A-fib ([place])   . Abnormal involuntary movements(781.0)   . Anemia   . Anxiety   . Anxiety state, unspecified   . Arthritis    body, neck  . Benign hypertensive heart disease   . Benign hypertensive heart disease with congestive heart failure ([place])   . Bladder disorder    over active  . Cancer ([place])    skin, removed  . Cervicalgia   . CHF (congestive heart failure) ([place])   . Chronic kidney disease, stage III (moderate)   . Congestive heart failure, unspecified   . Contact dermatitis  and other eczema, due to unspecified cause   . Corns and callosities   . Diarrhea   . Dyspnea 02/18/2015  . Edema   . Esophageal stricture   . Gastroparesis   . Gastroparesis   . GERD (gastroesophageal reflux disease)   . Hiatal hernia   . Hyperlipidemia   . Inflamed seborrheic keratosis   . Inflammatory disease of breast   . Irregular heartbeat   . Lumbago   . Mixed incontinence urge and stress (female)(female)   . Nausea alone   . Nonspecific (abnormal) findings on radiological and other examination of skull and head   . Osteoarthrosis, unspecified whether generalized or localized, unspecified site   . Other and unspecified hyperlipidemia   . Other B-complex deficiencies   . Other functional disorder of bladder   . Other malaise and fatigue   . Other specified visual disturbances   . Pain in joint, lower leg   . Palpitations   . Peripheral vascular disease, unspecified ([place])   . Postmenopausal atrophic vaginitis   . Reflux esophagitis   . Seborrheic keratosis   . Sleep apnea   . Spasm of muscle   . Spinal stenosis   . Spinal stenosis, unspecified region other than cervical   . Stricture and stenosis of esophagus   .  Stroke ([place])   . TIA (transient ischemic attack) 2012  . Transient ischemic attack (TIA), and cerebral infarction without residual deficits(V12.54)   . Type II or unspecified type diabetes mellitus with renal manifestations, not stated as uncontrolled(250.40)   . Unspecified constipation   . Unspecified essential hypertension   . Unspecified hereditary and idiopathic peripheral neuropathy   . Unspecified hypothyroidism   . Unspecified pruritic disorder   . Unspecified sleep apnea   . Unspecified vitamin D deficiency   . Urinary tract infection, site not specified   . Vitamin B12 deficiency     Past Surgical History:  Procedure Laterality Date  . ABDOMINAL HYSTERECTOMY    . BACK SURGERY     x 2  . CHOLECYSTECTOMY  1991  . esophageal  2004,2006    dilation, Dr Lyla Son  . [place]   bilateral cataract  . hammer toes    . HERNIA REPAIR  1984aaaaaaaa  . SKIN CANCER DESTRUCTION    . THYROIDECTOMY, PARTIAL  1965    Allergies  Allergen Reactions  . Celebrex [Celecoxib] Rash  . Doxycycline Rash  . Lyrica [Pregabalin] Rash  . Avandia [Rosiglitazone]   . Macrodantin [Nitrofurantoin Macrocrystal]   . Sulfa Antibiotics   . Vioxx [Rofecoxib]   . Silicone Rash    Gets rash from the touch it.    Allergies as of 03/06/2017      Reactions   Celebrex [celecoxib] Rash   Doxycycline Rash   Lyrica [pregabalin] Rash   Avandia [rosiglitazone]    Macrodantin [nitrofurantoin Macrocrystal]    Sulfa Antibiotics    Vioxx [rofecoxib]    Silicone Rash   Gets rash from the touch it.      Medication List       Accurate as of 03/06/17 11:53 AM. Always use your most recent med list.          ALPRAZolam 0.5 MG tablet Commonly known as:  XANAX TAKE ONE TABLET BY MOUTH 3 TIMES DAILY AS NEEDED FOR ANXIETY   aspirin 325 MG EC tablet Take 325 mg by mouth daily.   BENADRYL ITCH STOPPING cream Generic drug:  diphenhydrAMINE-zinc acetate Apply 1 application topically 3 (three) times daily as needed.   CRANBERRY PO Take 300 mg by mouth daily.   [employer] NATURAL LUTEIN EYE HEALTH PO Take by mouth. One tablet once daily for eyes   digoxin 0.125 MG tablet Commonly known as:  LANOXIN TAKE 1 TABLET BY MOUTH ON MONDAY, WEDNESDAY, FRIDAY AND SATURDAY   furosemide 40 MG tablet Commonly known as:  LASIX One daily to control edema   gabapentin 100 MG capsule Commonly known as:  NEURONTIN Take 2 capsules by mouth at bedtime to help tremors.   HYDROcodone-acetaminophen 5-325 MG tablet Commonly known as:  NORCO/VICODIN TAKE 1 TABLET BY MOUTH 4 TIMES A DAY AS NEEDED FOR PAIN.   insulin glargine 100 UNIT/ML injection Commonly known as:  LANTUS Inject 0.42 mLs (42 Units total) into the skin at bedtime.   insulin lispro 100  UNIT/ML KiwkPen Commonly known as:  HUMALOG KWIKPEN Inject 10 units before breakfast, 12 units before lunch and 14 units before suppler to control sugar. May use up to 3 more units per per meal as needed per sliding scale max units 45   Insulin Pen Needle 31G X 8 MM Misc Commonly known as:  B-D ULTRAFINE III SHORT PEN Use with administering insulin. Dx. E11.29   levothyroxine 125 MCG tablet Commonly known as:  SYNTHROID, LEVOTHROID TAKE 1 TABLET BY MOUTH EVERY MORNING 30 MINUTES BEFORE BREAKFAST.   metoprolol tartrate 50 MG tablet Commonly known as:  LOPRESSOR TAKE 1 TABLET BY MOUTH TWICE A DAY TO CONTROL HEART RATE   nitroGLYCERIN 0.4 MG SL tablet Commonly known as:  NITROSTAT Place 1 tablet (0.4 mg total) under the tongue every 5 (five) minutes as needed for chest pain.   omeprazole 40 MG capsule Commonly known as:  PRILOSEC TAKE ONE (1) CAPSULE BY MOUTH 2 TIMES DAILY FOR STOMACH   ondansetron 4 MG tablet Commonly known as:  ZOFRAN TAKE 1 TABLET EVERY 4 HOURS AS NEEDED FOR NAUSEA/VOMITING   ONE TOUCH ULTRA TEST test strip Generic drug:  glucose blood Use to test sugar three times daily dx E11.29   PRESERVISION AREDS 2 PO Take by mouth. 1 by mouth two times daily   SYSTANE OP Apply to eye. One drop both eyes twice daily for dry eyes   trimethoprim 100 MG tablet Commonly known as:  TRIMPEX TAKE ONE TABLET BY MOUTH EVERY NIGHT AT BEDTIME TO PREVENT BLADDER INFECTION   Vitamin D (Ergocalciferol) 50000 units Caps capsule Commonly known as:  DRISDOL TAKE 1 CAPSULE BY MOUTH ONCE A WEEK   VITAMIN E PO Take 400 Units by mouth daily.       Review of Systems:  Review of Systems  Constitutional: Positive for malaise/fatigue. Negative for chills and fever.  Eyes: Positive for blurred vision.       Left eye  Respiratory: Negative for cough and shortness of breath.   Cardiovascular: Positive for chest pain and leg swelling. Negative for palpitations, orthopnea and PND.    Gastrointestinal: Negative for abdominal pain, blood in stool, constipation and melena.  Genitourinary: Negative for dysuria.  Musculoskeletal: Positive for joint pain and myalgias. Negative for falls.  Skin: Positive for rash. Negative for itching.  Neurological: Positive for tingling and sensory change. Negative for dizziness and loss of consciousness.  Endo/Heme/Allergies: Bruises/bleeds easily.  Psychiatric/Behavioral: Negative for memory loss. The patient is nervous/anxious.     Health Maintenance  Topic Date Due  . OPHTHALMOLOGY EXAM  09/26/2015  . FOOT EXAM  11/12/2015  . INFLUENZA VACCINE  03/01/2017  . HEMOGLOBIN A1C  06/21/2017  . URINE MICROALBUMIN  09/20/2017  . TETANUS/TDAP  07/21/2023  . DEXA SCAN  Completed  . PNA vac Low Risk Adult  Completed    Physical Exam: Vitals:   03/06/17 1132  BP: 126/60  Pulse: (!) 56  Temp: 98.1 F (36.7 C)  TempSrc: Oral  SpO2: 99%  Weight: 141 lb (64 kg)  Height: 5\' 5"  (1.651 m)   Body mass index is 23.46 kg/m. Physical Exam  Constitutional: She is oriented to person, place, and time. She appears well-developed and well-nourished. No distress.  Cardiovascular: Normal rate and regular rhythm.   Pulmonary/Chest: Effort normal and breath sounds normal.  Musculoskeletal: Normal range of motion.  Frail, moves slowly, needed help from son to get up out of chair, unsteady and must pause before walking with cane  Neurological: She is alert and oriented to person, place, and time.  tremulous  Skin: Skin is warm and dry.  Mild erythema of ankles, left greater than right, fungal toenails, varicose veins  Psychiatric:  anxious    Labs reviewed: Basic Metabolic Panel:  Recent Labs  07/18/16 0907 09/15/16 0901 12/19/16 0900  NA 141 138 141  K 4.6 4.8 4.9  CL 104 100 104  CO2 31 29 26   GLUCOSE 100*  94 187*  BUN 27* 36* 26*  CREATININE 1.42* 1.78* 1.43*  CALCIUM 8.8 9.0 8.8  TSH 1.46 4.27 1.48   Liver Function  Tests:  Recent Labs  07/18/16 0907 09/15/16 0901 12/19/16 0900  AST 25 28 17   ALT 21 28 18   ALKPHOS 97 100 115  BILITOT 0.5 0.4 0.6  PROT 6.5 6.7 6.5  ALBUMIN 4.1 3.9 4.0   No results for input(s): LIPASE, AMYLASE in the last 8760 hours. No results for input(s): AMMONIA in the last 8760 hours. CBC:  Recent Labs  05/04/16 2132 01/18/17 1026  WBC  --  10.9*  NEUTROABS  --  7,630  HGB 12.6 11.2*  HCT 37.0 34.7*  MCV  --  94.3  PLT  --  242   Lipid Panel:  Recent Labs  05/11/16 0909 09/15/16 0901  CHOL 147 159  HDL 27* 27*  LDLCALC 92 95  TRIG 140 186*  CHOLHDL 5.4* 5.9*   Lab Results  Component Value Date   HGBA1C 6.5 (H) 12/19/2016   Assessment/Plan 1. Venous insufficiency of both lower extremities -counseled again on use of compression -her son is going to get her some compression socks that are for soccer which should be easier for her to put on than true compression hose (due to her back--unable to bend over)  2. Tinea pedis of both feet -seems that erythema on legs is not a bacterial cellulitis--this looks like the classic fungal infection that seems common in pts with venous insufficiency (I don't see skin infection b/w toes but she does have fungal toenails) - clotrimazole-betamethasone (LOTRISONE) cream; Apply 1 application topically 2 (two) times daily.  Dispense: 45 g; Refill: 3  3. Controlled type 2 diabetes with neuropathy ([place]) -bothered by hs cramping from neuropathy and back pain--continue gabapentin therapy  Keep routine visit so we can address her medications thoroughly  Labs/tests ordered:  No orders of the defined types were placed in this encounter.  Next appt:  03/22/2017, labs as already ordered before  [place]. Rendon Howell, D.O. [facility] [address], [place] [address] Cell Phone (Mon-Fri 8am-5pm):  [phone] On Call:  [phone] & follow prompts after 5pm & weekends Office  Phone:  [phone] Office Fax:  [phone]

## 2017-03-06 NOTE — Telephone Encounter (Signed)
Appointment scheduled for today 03/06/17

## 2017-03-06 NOTE — Patient Instructions (Signed)
Please apply the new cream to both legs after bathing each morning and again at bedtime. Elevate your feet at rest.   Also wear compression socks on both feet to help with swelling.

## 2017-03-07 DIAGNOSIS — E1143 Type 2 diabetes mellitus with diabetic autonomic (poly)neuropathy: Secondary | ICD-10-CM | POA: Diagnosis not present

## 2017-03-07 DIAGNOSIS — E1151 Type 2 diabetes mellitus with diabetic peripheral angiopathy without gangrene: Secondary | ICD-10-CM | POA: Diagnosis not present

## 2017-03-07 DIAGNOSIS — Z9181 History of falling: Secondary | ICD-10-CM | POA: Diagnosis not present

## 2017-03-07 DIAGNOSIS — I509 Heart failure, unspecified: Secondary | ICD-10-CM | POA: Diagnosis not present

## 2017-03-07 DIAGNOSIS — Z8673 Personal history of transient ischemic attack (TIA), and cerebral infarction without residual deficits: Secondary | ICD-10-CM | POA: Diagnosis not present

## 2017-03-07 DIAGNOSIS — Z794 Long term (current) use of insulin: Secondary | ICD-10-CM | POA: Diagnosis not present

## 2017-03-07 DIAGNOSIS — I110 Hypertensive heart disease with heart failure: Secondary | ICD-10-CM | POA: Diagnosis not present

## 2017-03-07 DIAGNOSIS — N183 Chronic kidney disease, stage 3 (moderate): Secondary | ICD-10-CM | POA: Diagnosis not present

## 2017-03-07 DIAGNOSIS — E1122 Type 2 diabetes mellitus with diabetic chronic kidney disease: Secondary | ICD-10-CM | POA: Diagnosis not present

## 2017-03-07 DIAGNOSIS — M48061 Spinal stenosis, lumbar region without neurogenic claudication: Secondary | ICD-10-CM | POA: Diagnosis not present

## 2017-03-07 DIAGNOSIS — M170 Bilateral primary osteoarthritis of knee: Secondary | ICD-10-CM | POA: Diagnosis not present

## 2017-03-07 DIAGNOSIS — R296 Repeated falls: Secondary | ICD-10-CM | POA: Diagnosis not present

## 2017-03-09 DIAGNOSIS — I509 Heart failure, unspecified: Secondary | ICD-10-CM | POA: Diagnosis not present

## 2017-03-09 DIAGNOSIS — Z9181 History of falling: Secondary | ICD-10-CM | POA: Diagnosis not present

## 2017-03-09 DIAGNOSIS — R296 Repeated falls: Secondary | ICD-10-CM | POA: Diagnosis not present

## 2017-03-09 DIAGNOSIS — M48061 Spinal stenosis, lumbar region without neurogenic claudication: Secondary | ICD-10-CM | POA: Diagnosis not present

## 2017-03-09 DIAGNOSIS — Z8673 Personal history of transient ischemic attack (TIA), and cerebral infarction without residual deficits: Secondary | ICD-10-CM | POA: Diagnosis not present

## 2017-03-09 DIAGNOSIS — N183 Chronic kidney disease, stage 3 (moderate): Secondary | ICD-10-CM | POA: Diagnosis not present

## 2017-03-09 DIAGNOSIS — M170 Bilateral primary osteoarthritis of knee: Secondary | ICD-10-CM | POA: Diagnosis not present

## 2017-03-09 DIAGNOSIS — E1143 Type 2 diabetes mellitus with diabetic autonomic (poly)neuropathy: Secondary | ICD-10-CM | POA: Diagnosis not present

## 2017-03-09 DIAGNOSIS — E1151 Type 2 diabetes mellitus with diabetic peripheral angiopathy without gangrene: Secondary | ICD-10-CM | POA: Diagnosis not present

## 2017-03-09 DIAGNOSIS — E1122 Type 2 diabetes mellitus with diabetic chronic kidney disease: Secondary | ICD-10-CM | POA: Diagnosis not present

## 2017-03-09 DIAGNOSIS — I110 Hypertensive heart disease with heart failure: Secondary | ICD-10-CM | POA: Diagnosis not present

## 2017-03-09 DIAGNOSIS — Z794 Long term (current) use of insulin: Secondary | ICD-10-CM | POA: Diagnosis not present

## 2017-03-10 ENCOUNTER — Telehealth: Payer: Self-pay | Admitting: *Deleted

## 2017-03-10 DIAGNOSIS — I110 Hypertensive heart disease with heart failure: Secondary | ICD-10-CM | POA: Diagnosis not present

## 2017-03-10 DIAGNOSIS — Z794 Long term (current) use of insulin: Secondary | ICD-10-CM | POA: Diagnosis not present

## 2017-03-10 DIAGNOSIS — R296 Repeated falls: Secondary | ICD-10-CM | POA: Diagnosis not present

## 2017-03-10 DIAGNOSIS — I509 Heart failure, unspecified: Secondary | ICD-10-CM | POA: Diagnosis not present

## 2017-03-10 DIAGNOSIS — M48061 Spinal stenosis, lumbar region without neurogenic claudication: Secondary | ICD-10-CM | POA: Diagnosis not present

## 2017-03-10 DIAGNOSIS — Z8673 Personal history of transient ischemic attack (TIA), and cerebral infarction without residual deficits: Secondary | ICD-10-CM | POA: Diagnosis not present

## 2017-03-10 DIAGNOSIS — M170 Bilateral primary osteoarthritis of knee: Secondary | ICD-10-CM | POA: Diagnosis not present

## 2017-03-10 DIAGNOSIS — Z9181 History of falling: Secondary | ICD-10-CM | POA: Diagnosis not present

## 2017-03-10 DIAGNOSIS — E1151 Type 2 diabetes mellitus with diabetic peripheral angiopathy without gangrene: Secondary | ICD-10-CM | POA: Diagnosis not present

## 2017-03-10 DIAGNOSIS — E1122 Type 2 diabetes mellitus with diabetic chronic kidney disease: Secondary | ICD-10-CM | POA: Diagnosis not present

## 2017-03-10 DIAGNOSIS — N183 Chronic kidney disease, stage 3 (moderate): Secondary | ICD-10-CM | POA: Diagnosis not present

## 2017-03-10 DIAGNOSIS — E1143 Type 2 diabetes mellitus with diabetic autonomic (poly)neuropathy: Secondary | ICD-10-CM | POA: Diagnosis not present

## 2017-03-10 NOTE — Telephone Encounter (Signed)
Karen [name] pt calling asking about a form you faxed in for patient assistance for lantus. We got samples or an order in but it didn't have a patient name on it. ( see form on your desk) I also didn't see the form that you faxed in. Can you please call pt on Monday.

## 2017-03-13 NOTE — Telephone Encounter (Signed)
Called [employer] [phone] and spoke with [place]. She stated that a Packing list that is white is samples and a Packing list that is Nyoka Cowden is a patient's. [place] checked on patients samples and they are still in Process to be shipped out. Patient notified. She had to buy some insulin Saturday at her pharmacy.

## 2017-03-14 DIAGNOSIS — Z9181 History of falling: Secondary | ICD-10-CM | POA: Diagnosis not present

## 2017-03-14 DIAGNOSIS — N183 Chronic kidney disease, stage 3 (moderate): Secondary | ICD-10-CM | POA: Diagnosis not present

## 2017-03-14 DIAGNOSIS — M48061 Spinal stenosis, lumbar region without neurogenic claudication: Secondary | ICD-10-CM | POA: Diagnosis not present

## 2017-03-14 DIAGNOSIS — R296 Repeated falls: Secondary | ICD-10-CM | POA: Diagnosis not present

## 2017-03-14 DIAGNOSIS — M170 Bilateral primary osteoarthritis of knee: Secondary | ICD-10-CM | POA: Diagnosis not present

## 2017-03-14 DIAGNOSIS — E1151 Type 2 diabetes mellitus with diabetic peripheral angiopathy without gangrene: Secondary | ICD-10-CM | POA: Diagnosis not present

## 2017-03-14 DIAGNOSIS — E1122 Type 2 diabetes mellitus with diabetic chronic kidney disease: Secondary | ICD-10-CM | POA: Diagnosis not present

## 2017-03-14 DIAGNOSIS — I509 Heart failure, unspecified: Secondary | ICD-10-CM | POA: Diagnosis not present

## 2017-03-14 DIAGNOSIS — I110 Hypertensive heart disease with heart failure: Secondary | ICD-10-CM | POA: Diagnosis not present

## 2017-03-14 DIAGNOSIS — E1143 Type 2 diabetes mellitus with diabetic autonomic (poly)neuropathy: Secondary | ICD-10-CM | POA: Diagnosis not present

## 2017-03-14 DIAGNOSIS — Z794 Long term (current) use of insulin: Secondary | ICD-10-CM | POA: Diagnosis not present

## 2017-03-14 DIAGNOSIS — Z8673 Personal history of transient ischemic attack (TIA), and cerebral infarction without residual deficits: Secondary | ICD-10-CM | POA: Diagnosis not present

## 2017-03-15 ENCOUNTER — Other Ambulatory Visit: Payer: Self-pay | Admitting: Internal Medicine

## 2017-03-15 DIAGNOSIS — E559 Vitamin D deficiency, unspecified: Secondary | ICD-10-CM

## 2017-03-16 ENCOUNTER — Telehealth: Payer: Self-pay

## 2017-03-16 DIAGNOSIS — E1151 Type 2 diabetes mellitus with diabetic peripheral angiopathy without gangrene: Secondary | ICD-10-CM | POA: Diagnosis not present

## 2017-03-16 DIAGNOSIS — I110 Hypertensive heart disease with heart failure: Secondary | ICD-10-CM | POA: Diagnosis not present

## 2017-03-16 DIAGNOSIS — I509 Heart failure, unspecified: Secondary | ICD-10-CM | POA: Diagnosis not present

## 2017-03-16 DIAGNOSIS — E1122 Type 2 diabetes mellitus with diabetic chronic kidney disease: Secondary | ICD-10-CM | POA: Diagnosis not present

## 2017-03-16 DIAGNOSIS — Z9181 History of falling: Secondary | ICD-10-CM | POA: Diagnosis not present

## 2017-03-16 DIAGNOSIS — M170 Bilateral primary osteoarthritis of knee: Secondary | ICD-10-CM | POA: Diagnosis not present

## 2017-03-16 DIAGNOSIS — Z794 Long term (current) use of insulin: Secondary | ICD-10-CM | POA: Diagnosis not present

## 2017-03-16 DIAGNOSIS — E1143 Type 2 diabetes mellitus with diabetic autonomic (poly)neuropathy: Secondary | ICD-10-CM | POA: Diagnosis not present

## 2017-03-16 DIAGNOSIS — N183 Chronic kidney disease, stage 3 (moderate): Secondary | ICD-10-CM | POA: Diagnosis not present

## 2017-03-16 DIAGNOSIS — Z8673 Personal history of transient ischemic attack (TIA), and cerebral infarction without residual deficits: Secondary | ICD-10-CM | POA: Diagnosis not present

## 2017-03-16 DIAGNOSIS — M48061 Spinal stenosis, lumbar region without neurogenic claudication: Secondary | ICD-10-CM | POA: Diagnosis not present

## 2017-03-16 DIAGNOSIS — R296 Repeated falls: Secondary | ICD-10-CM | POA: Diagnosis not present

## 2017-03-16 NOTE — Telephone Encounter (Signed)
I called patient to let her know that a shipment of Lantus Solostar pens arrived today. Three boxes came.   Patient stated that she would have someone pick them up today.

## 2017-03-17 DIAGNOSIS — I509 Heart failure, unspecified: Secondary | ICD-10-CM | POA: Diagnosis not present

## 2017-03-17 DIAGNOSIS — E1151 Type 2 diabetes mellitus with diabetic peripheral angiopathy without gangrene: Secondary | ICD-10-CM | POA: Diagnosis not present

## 2017-03-17 DIAGNOSIS — Z8673 Personal history of transient ischemic attack (TIA), and cerebral infarction without residual deficits: Secondary | ICD-10-CM | POA: Diagnosis not present

## 2017-03-17 DIAGNOSIS — M170 Bilateral primary osteoarthritis of knee: Secondary | ICD-10-CM | POA: Diagnosis not present

## 2017-03-17 DIAGNOSIS — N183 Chronic kidney disease, stage 3 (moderate): Secondary | ICD-10-CM | POA: Diagnosis not present

## 2017-03-17 DIAGNOSIS — I110 Hypertensive heart disease with heart failure: Secondary | ICD-10-CM | POA: Diagnosis not present

## 2017-03-17 DIAGNOSIS — R296 Repeated falls: Secondary | ICD-10-CM | POA: Diagnosis not present

## 2017-03-17 DIAGNOSIS — E1143 Type 2 diabetes mellitus with diabetic autonomic (poly)neuropathy: Secondary | ICD-10-CM | POA: Diagnosis not present

## 2017-03-17 DIAGNOSIS — Z794 Long term (current) use of insulin: Secondary | ICD-10-CM | POA: Diagnosis not present

## 2017-03-17 DIAGNOSIS — Z9181 History of falling: Secondary | ICD-10-CM | POA: Diagnosis not present

## 2017-03-17 DIAGNOSIS — M48061 Spinal stenosis, lumbar region without neurogenic claudication: Secondary | ICD-10-CM | POA: Diagnosis not present

## 2017-03-17 DIAGNOSIS — E1122 Type 2 diabetes mellitus with diabetic chronic kidney disease: Secondary | ICD-10-CM | POA: Diagnosis not present

## 2017-03-21 DIAGNOSIS — Z794 Long term (current) use of insulin: Secondary | ICD-10-CM | POA: Diagnosis not present

## 2017-03-21 DIAGNOSIS — N183 Chronic kidney disease, stage 3 (moderate): Secondary | ICD-10-CM | POA: Diagnosis not present

## 2017-03-21 DIAGNOSIS — Z8673 Personal history of transient ischemic attack (TIA), and cerebral infarction without residual deficits: Secondary | ICD-10-CM | POA: Diagnosis not present

## 2017-03-21 DIAGNOSIS — Z9181 History of falling: Secondary | ICD-10-CM | POA: Diagnosis not present

## 2017-03-21 DIAGNOSIS — I110 Hypertensive heart disease with heart failure: Secondary | ICD-10-CM | POA: Diagnosis not present

## 2017-03-21 DIAGNOSIS — M170 Bilateral primary osteoarthritis of knee: Secondary | ICD-10-CM | POA: Diagnosis not present

## 2017-03-21 DIAGNOSIS — E1122 Type 2 diabetes mellitus with diabetic chronic kidney disease: Secondary | ICD-10-CM | POA: Diagnosis not present

## 2017-03-21 DIAGNOSIS — I509 Heart failure, unspecified: Secondary | ICD-10-CM | POA: Diagnosis not present

## 2017-03-21 DIAGNOSIS — E1151 Type 2 diabetes mellitus with diabetic peripheral angiopathy without gangrene: Secondary | ICD-10-CM | POA: Diagnosis not present

## 2017-03-21 DIAGNOSIS — R296 Repeated falls: Secondary | ICD-10-CM | POA: Diagnosis not present

## 2017-03-21 DIAGNOSIS — M48061 Spinal stenosis, lumbar region without neurogenic claudication: Secondary | ICD-10-CM | POA: Diagnosis not present

## 2017-03-21 DIAGNOSIS — E1143 Type 2 diabetes mellitus with diabetic autonomic (poly)neuropathy: Secondary | ICD-10-CM | POA: Diagnosis not present

## 2017-03-22 ENCOUNTER — Other Ambulatory Visit: Payer: Medicare Other

## 2017-03-22 DIAGNOSIS — Z794 Long term (current) use of insulin: Secondary | ICD-10-CM

## 2017-03-22 DIAGNOSIS — E1143 Type 2 diabetes mellitus with diabetic autonomic (poly)neuropathy: Secondary | ICD-10-CM | POA: Diagnosis not present

## 2017-03-22 DIAGNOSIS — E1151 Type 2 diabetes mellitus with diabetic peripheral angiopathy without gangrene: Secondary | ICD-10-CM | POA: Diagnosis not present

## 2017-03-22 DIAGNOSIS — I509 Heart failure, unspecified: Secondary | ICD-10-CM | POA: Diagnosis not present

## 2017-03-22 DIAGNOSIS — N183 Chronic kidney disease, stage 3 unspecified: Secondary | ICD-10-CM

## 2017-03-22 DIAGNOSIS — Z8673 Personal history of transient ischemic attack (TIA), and cerebral infarction without residual deficits: Secondary | ICD-10-CM | POA: Diagnosis not present

## 2017-03-22 DIAGNOSIS — E1122 Type 2 diabetes mellitus with diabetic chronic kidney disease: Secondary | ICD-10-CM | POA: Diagnosis not present

## 2017-03-22 DIAGNOSIS — R296 Repeated falls: Secondary | ICD-10-CM | POA: Diagnosis not present

## 2017-03-22 DIAGNOSIS — M48061 Spinal stenosis, lumbar region without neurogenic claudication: Secondary | ICD-10-CM | POA: Diagnosis not present

## 2017-03-22 DIAGNOSIS — M170 Bilateral primary osteoarthritis of knee: Secondary | ICD-10-CM | POA: Diagnosis not present

## 2017-03-22 DIAGNOSIS — Z9181 History of falling: Secondary | ICD-10-CM | POA: Diagnosis not present

## 2017-03-22 DIAGNOSIS — I110 Hypertensive heart disease with heart failure: Secondary | ICD-10-CM | POA: Diagnosis not present

## 2017-03-22 LAB — CBC WITH DIFFERENTIAL/PLATELET
Basophils Absolute: 0 cells/uL (ref 0–200)
Basophils Relative: 0 %
Eosinophils Absolute: 356 cells/uL (ref 15–500)
Eosinophils Relative: 4 %
HCT: 36.1 % (ref 35.0–45.0)
Hemoglobin: 11.5 g/dL — ABNORMAL LOW (ref 11.7–15.5)
Lymphocytes Relative: 30 %
Lymphs Abs: 2670 cells/uL (ref 850–3900)
MCH: 29.8 pg (ref 27.0–33.0)
MCHC: 31.9 g/dL — ABNORMAL LOW (ref 32.0–36.0)
MCV: 93.5 fL (ref 80.0–100.0)
MPV: 10.6 fL (ref 7.5–12.5)
Monocytes Absolute: 623 cells/uL (ref 200–950)
Monocytes Relative: 7 %
Neutro Abs: 5251 cells/uL (ref 1500–7800)
Neutrophils Relative %: 59 %
Platelets: 242 10*3/uL (ref 140–400)
RBC: 3.86 MIL/uL (ref 3.80–5.10)
RDW: 13.6 % (ref 11.0–15.0)
WBC: 8.9 10*3/uL (ref 3.8–10.8)

## 2017-03-23 LAB — COMPLETE METABOLIC PANEL WITH GFR
ALT: 18 U/L (ref 6–29)
AST: 20 U/L (ref 10–35)
Albumin: 4.1 g/dL (ref 3.6–5.1)
Alkaline Phosphatase: 118 U/L (ref 33–130)
BUN: 28 mg/dL — ABNORMAL HIGH (ref 7–25)
CO2: 26 mmol/L (ref 20–32)
Calcium: 8.7 mg/dL (ref 8.6–10.4)
Chloride: 101 mmol/L (ref 98–110)
Creat: 1.57 mg/dL — ABNORMAL HIGH (ref 0.60–0.88)
GFR, Est African American: 33 mL/min — ABNORMAL LOW (ref >=60)
GFR, Est Non African American: 29 mL/min — ABNORMAL LOW (ref >=60)
Glucose, Bld: 153 mg/dL — ABNORMAL HIGH (ref 65–99)
Potassium: 5.3 mmol/L (ref 3.5–5.3)
Sodium: 139 mmol/L (ref 135–146)
Total Bilirubin: 0.4 mg/dL (ref 0.2–1.2)
Total Protein: 6.4 g/dL (ref 6.1–8.1)

## 2017-03-23 LAB — LIPID PANEL
Cholesterol: 138 mg/dL (ref <200)
HDL: 25 mg/dL — ABNORMAL LOW (ref >50)
LDL Cholesterol: 86 mg/dL (ref <100)
Total CHOL/HDL Ratio: 5.5 Ratio — ABNORMAL HIGH (ref <5.0)
Triglycerides: 134 mg/dL (ref <150)
VLDL: 27 mg/dL (ref <30)

## 2017-03-23 LAB — HEMOGLOBIN A1C
Hgb A1c MFr Bld: 7.7 % — ABNORMAL HIGH (ref <5.7)
Mean Plasma Glucose: 174 mg/dL

## 2017-03-24 ENCOUNTER — Encounter: Payer: Self-pay | Admitting: *Deleted

## 2017-03-27 ENCOUNTER — Ambulatory Visit (INDEPENDENT_AMBULATORY_CARE_PROVIDER_SITE_OTHER): Payer: Medicare Other | Admitting: Internal Medicine

## 2017-03-27 ENCOUNTER — Telehealth: Payer: Self-pay | Admitting: *Deleted

## 2017-03-27 ENCOUNTER — Encounter: Payer: Self-pay | Admitting: Internal Medicine

## 2017-03-27 VITALS — BP 140/60 | HR 64 | Temp 98.0°F | Wt 142.0 lb

## 2017-03-27 DIAGNOSIS — E1140 Type 2 diabetes mellitus with diabetic neuropathy, unspecified: Secondary | ICD-10-CM

## 2017-03-27 DIAGNOSIS — E1122 Type 2 diabetes mellitus with diabetic chronic kidney disease: Secondary | ICD-10-CM | POA: Diagnosis not present

## 2017-03-27 DIAGNOSIS — N183 Chronic kidney disease, stage 3 unspecified: Secondary | ICD-10-CM

## 2017-03-27 DIAGNOSIS — E039 Hypothyroidism, unspecified: Secondary | ICD-10-CM

## 2017-03-27 DIAGNOSIS — F419 Anxiety disorder, unspecified: Secondary | ICD-10-CM

## 2017-03-27 DIAGNOSIS — N182 Chronic kidney disease, stage 2 (mild): Secondary | ICD-10-CM | POA: Diagnosis not present

## 2017-03-27 DIAGNOSIS — I480 Paroxysmal atrial fibrillation: Secondary | ICD-10-CM | POA: Diagnosis not present

## 2017-03-27 DIAGNOSIS — E1165 Type 2 diabetes mellitus with hyperglycemia: Secondary | ICD-10-CM | POA: Diagnosis not present

## 2017-03-27 DIAGNOSIS — Z794 Long term (current) use of insulin: Secondary | ICD-10-CM

## 2017-03-27 DIAGNOSIS — M170 Bilateral primary osteoarthritis of knee: Secondary | ICD-10-CM | POA: Diagnosis not present

## 2017-03-27 DIAGNOSIS — I872 Venous insufficiency (chronic) (peripheral): Secondary | ICD-10-CM

## 2017-03-27 DIAGNOSIS — IMO0002 Reserved for concepts with insufficient information to code with codable children: Secondary | ICD-10-CM

## 2017-03-27 MED ORDER — DULOXETINE HCL 30 MG PO CPEP: 30.0000 mg | ORAL_CAPSULE | Freq: Every day | ORAL | 3 refills | Status: DC

## 2017-03-27 MED ORDER — DICLOFENAC SODIUM 1 % TD GEL: 4.0000 g | Freq: Four times a day (QID) | TRANSDERMAL | 5 refills | Status: AC

## 2017-03-27 MED ORDER — INSULIN LISPRO 100 UNIT/ML (KWIKPEN): PEN_INJECTOR | SUBCUTANEOUS | 3 refills | Status: DC

## 2017-03-27 MED ORDER — ALPRAZOLAM 0.5 MG PO TABS: 0.5000 mg | ORAL_TABLET | Freq: Two times a day (BID) | ORAL | 0 refills | Status: DC | PRN

## 2017-03-27 NOTE — Patient Instructions (Signed)
Let's try you on voltaren gel for your knees--you may put it on up to 4 times per day for pain.  Also, let's start cymbalta in the morning for your anxiety and chronic pain.  It comes in a few doses so we'll start low and may increase it if it's helping after 30 days.  I gave you 60 xanax also instead of 90.    Increase your lunchtime insulin to 14 units for the evening high sugars.

## 2017-03-27 NOTE — Progress Notes (Signed)
Location:  [facility] clinic Provider:  Ara Grandmaison L. Mariea Clonts, D.O., C.M.D.  Goals of Care:  Advanced Directives 03/27/2017  Does Patient Have a Medical Advance Directive? No  Type of Advance Directive -  Does patient want to make changes to medical advance directive? -  Copy of [place] in Chart? -  Would patient like information on creating a medical advance directive? No - Patient declined   Chief Complaint  Patient presents with  . Medical Management of Chronic Issues    12mth mth follow-up, check left leg    HPI: Patient is a 83 y.o. female seen today for medical management of chronic diseases.    Pt reports left leg is hard as a rock and tight and red.  Had some difficulty with this leg last visit also.  She tried to put on her compression hose.  Took 15 mins to get them on.  Had difficulty getting them off and wound up cutting them off with scissors.    DMII:  hba1c trended up to 7.7 from 6.5.  She is on insulin:  lantus 42 units at hs, humalog 10 before breakfast, 14 before lunch and 14 before supper.  Notes dinner sugars are a little high sometimes 200s. Nighttime is 140-200.      Chronic pain:  Continues on hydrocodone for pain 4 x a day, last filled 03/06/17.  Affects back, knees the most.  Left knee still painful despite gel injections in both knees.  Next f/u is 9/10.  Therapy has helped some.  She made it to church Sunday.  Was stiff at the end.   Afib:  On dig and lopressor for rate control--HR 64 today.  Not on blood thinners with frequent falls, only full dose asa 325mg   Anxiety:  Remains on xanax tid prn.  Not on an ssri or snri.  Says she has not overtaken her xanax, but it is already out--took the last one last night and she had it filled 8/6.  Has been on it for years.    Peripheral neuropathy:  On gabapentin 200mg  at hs "for tremors" but has cramping in toes and   Hypothyroidism:  On levothyroxine 123mcg.  TSH normal in may.    Hyperlipidemia:  Not on  statin at 89 with frailty.     Past Medical History:  Diagnosis Date  . A-fib ([place])   . Abnormal involuntary movements(781.0)   . Anemia   . Anxiety   . Anxiety state, unspecified   . Arthritis    body, neck  . Benign hypertensive heart disease   . Benign hypertensive heart disease with congestive heart failure ([place])   . Bladder disorder    over active  . Cancer ([place])    skin, removed  . Cervicalgia   . CHF (congestive heart failure) ([place])   . Chronic kidney disease, stage III (moderate)   . Congestive heart failure, unspecified   . Contact dermatitis and other eczema, due to unspecified cause   . Corns and callosities   . Diarrhea   . Dyspnea 02/18/2015  . Edema   . Esophageal stricture   . Gastroparesis   . Gastroparesis   . GERD (gastroesophageal reflux disease)   . Hiatal hernia   . Hyperlipidemia   . Inflamed seborrheic keratosis   . Inflammatory disease of breast   . Irregular heartbeat   . Lumbago   . Mixed incontinence urge and stress (female)(female)   . Nausea alone   . Nonspecific (  abnormal) findings on radiological and other examination of skull and head   . Osteoarthrosis, unspecified whether generalized or localized, unspecified site   . Other and unspecified hyperlipidemia   . Other B-complex deficiencies   . Other functional disorder of bladder   . Other malaise and fatigue   . Other specified visual disturbances   . Pain in joint, lower leg   . Palpitations   . Peripheral vascular disease, unspecified ([place])   . Postmenopausal atrophic vaginitis   . Reflux esophagitis   . Seborrheic keratosis   . Sleep apnea   . Spasm of muscle   . Spinal stenosis   . Spinal stenosis, unspecified region other than cervical   . Stricture and stenosis of esophagus   . Stroke ([place])   . TIA (transient ischemic attack) 2012  . Transient ischemic attack (TIA), and cerebral infarction without residual deficits(V12.54)   . Type II or unspecified type diabetes mellitus  with renal manifestations, not stated as uncontrolled(250.40)   . Unspecified constipation   . Unspecified essential hypertension   . Unspecified hereditary and idiopathic peripheral neuropathy   . Unspecified hypothyroidism   . Unspecified pruritic disorder   . Unspecified sleep apnea   . Unspecified vitamin D deficiency   . Urinary tract infection, site not specified   . Vitamin B12 deficiency     Past Surgical History:  Procedure Laterality Date  . ABDOMINAL HYSTERECTOMY    . BACK SURGERY     x 2  . CHOLECYSTECTOMY  1991  . esophageal  2004,2006   dilation, Dr Lyla Son  . [place]   bilateral cataract  . hammer toes    . HERNIA REPAIR  1984aaaaaaaa  . SKIN CANCER DESTRUCTION    . THYROIDECTOMY, PARTIAL  1965    Allergies  Allergen Reactions  . Celebrex [Celecoxib] Rash  . Doxycycline Rash  . Lyrica [Pregabalin] Rash  . Avandia [Rosiglitazone]   . Macrodantin [Nitrofurantoin Macrocrystal]   . Sulfa Antibiotics   . Vioxx [Rofecoxib]   . Silicone Rash    Gets rash from the touch it.    Allergies as of 03/27/2017      Reactions   Celebrex [celecoxib] Rash   Doxycycline Rash   Lyrica [pregabalin] Rash   Avandia [rosiglitazone]    Macrodantin [nitrofurantoin Macrocrystal]    Sulfa Antibiotics    Vioxx [rofecoxib]    Silicone Rash   Gets rash from the touch it.      Medication List       Accurate as of 03/27/17  3:31 PM. Always use your most recent med list.          ALPRAZolam 0.5 MG tablet Commonly known as:  XANAX TAKE ONE TABLET BY MOUTH 3 TIMES DAILY AS NEEDED FOR ANXIETY   aspirin 325 MG EC tablet Take 325 mg by mouth daily.   BENADRYL ITCH STOPPING cream Generic drug:  diphenhydrAMINE-zinc acetate Apply 1 application topically 3 (three) times daily as needed.   clotrimazole-betamethasone cream Commonly known as:  LOTRISONE Apply 1 application topically 2 (two) times daily.   CRANBERRY PO Take 300 mg by mouth daily.     [employer] NATURAL LUTEIN EYE HEALTH PO Take by mouth. One tablet once daily for eyes   digoxin 0.125 MG tablet Commonly known as:  LANOXIN TAKE 1 TABLET BY MOUTH ON MONDAY, WEDNESDAY, FRIDAY AND SATURDAY   furosemide 40 MG tablet Commonly known as:  LASIX One daily to control edema  gabapentin 100 MG capsule Commonly known as:  NEURONTIN Take 2 capsules by mouth at bedtime to help tremors.   HYDROcodone-acetaminophen 5-325 MG tablet Commonly known as:  NORCO/VICODIN TAKE 1 TABLET BY MOUTH 4 TIMES A DAY AS NEEDED FOR PAIN.   insulin lispro 100 UNIT/ML KiwkPen Commonly known as:  HUMALOG KWIKPEN Inject 10 units before breakfast, 12 units before lunch and 14 units before suppler to control sugar. May use up to 3 more units per per meal as needed per sliding scale max units 45   Insulin Pen Needle 31G X 8 MM Misc Commonly known as:  B-D ULTRAFINE III SHORT PEN Use with administering insulin. Dx. E11.29   LANTUS SOLOSTAR 100 UNIT/ML Solostar Pen Generic drug:  Insulin Glargine Inject 42 Units into the skin daily at 10 pm.   levothyroxine 125 MCG tablet Commonly known as:  SYNTHROID, LEVOTHROID TAKE 1 TABLET BY MOUTH EVERY MORNING 30 MINUTES BEFORE BREAKFAST.   metoprolol tartrate 50 MG tablet Commonly known as:  LOPRESSOR TAKE 1 TABLET BY MOUTH TWICE A DAY TO CONTROL HEART RATE   nitroGLYCERIN 0.4 MG SL tablet Commonly known as:  NITROSTAT Place 1 tablet (0.4 mg total) under the tongue every 5 (five) minutes as needed for chest pain.   omeprazole 40 MG capsule Commonly known as:  PRILOSEC TAKE ONE (1) CAPSULE BY MOUTH 2 TIMES DAILY FOR STOMACH   ondansetron 4 MG tablet Commonly known as:  ZOFRAN TAKE 1 TABLET EVERY 4 HOURS AS NEEDED FOR NAUSEA/VOMITING   ONE TOUCH ULTRA TEST test strip Generic drug:  glucose blood Use to test sugar three times daily dx E11.29   PRESERVISION AREDS 2 PO Take by mouth. 1 by mouth two times daily   SYSTANE OP Apply to eye. One drop both  eyes twice daily for dry eyes   trimethoprim 100 MG tablet Commonly known as:  TRIMPEX TAKE ONE TABLET BY MOUTH EVERY NIGHT AT BEDTIME TO PREVENT BLADDER INFECTION   Vitamin D (Ergocalciferol) 50000 units Caps capsule Commonly known as:  DRISDOL TAKE 1 CAPSULE BY MOUTH ON THE SAME DAY EVERY WEEK AS DIRECTED.   VITAMIN E PO Take 400 Units by mouth daily.       Review of Systems:  Review of Systems  Constitutional: Positive for malaise/fatigue. Negative for chills and fever.  HENT: Positive for hearing loss. Negative for congestion.   Eyes: Negative for blurred vision.       Glasses  Respiratory: Negative for cough and shortness of breath.   Cardiovascular: Positive for leg swelling. Negative for chest pain and palpitations.  Genitourinary: Negative for dysuria.  Musculoskeletal: Positive for back pain, joint pain, myalgias and neck pain. Negative for falls.  Skin: Negative for itching and rash.  Neurological: Positive for dizziness, tingling, sensory change and weakness. Negative for loss of consciousness.  Endo/Heme/Allergies: Bruises/bleeds easily.  Psychiatric/Behavioral: Positive for depression. The patient is nervous/anxious.     Health Maintenance  Topic Date Due  . OPHTHALMOLOGY EXAM  09/26/2015  . FOOT EXAM  11/12/2015  . INFLUENZA VACCINE  03/01/2017  . URINE MICROALBUMIN  09/20/2017  . HEMOGLOBIN A1C  09/22/2017  . TETANUS/TDAP  07/21/2023  . DEXA SCAN  Completed  . PNA vac Low Risk Adult  Completed    Physical Exam: Vitals:   03/27/17 1517  BP: 140/60  Pulse: 64  Temp: 98 F (36.7 C)  TempSrc: Oral  SpO2: 98%  Weight: 142 lb (64.4 kg)   Body mass index is 23.63 kg/m.  Physical Exam  Constitutional: She is oriented to person, place, and time. No distress.  HENT:  Head: Normocephalic and atraumatic.  Eyes:  glasses  Cardiovascular:  irreg irreg  Pulmonary/Chest: Effort normal and breath sounds normal. No respiratory distress.  Abdominal: Bowel  sounds are normal.  Musculoskeletal:  Appears very stiff, weak getting up out of chair, uses cane, unsteady  Neurological: She is alert and oriented to person, place, and time. No cranial nerve deficit.  Skin: Skin is warm and dry.  Psychiatric:  Somewhat flat affect    Labs reviewed: Basic Metabolic Panel:  Recent Labs  07/18/16 0907 09/15/16 0901 12/19/16 0900 03/22/17 0857  NA 141 138 141 139  K 4.6 4.8 4.9 5.3  CL 104 100 104 101  CO2 31 29 26 26   GLUCOSE 100* 94 187* 153*  BUN 27* 36* 26* 28*  CREATININE 1.42* 1.78* 1.43* 1.57*  CALCIUM 8.8 9.0 8.8 8.7  TSH 1.46 4.27 1.48  --    Liver Function Tests:  Recent Labs  09/15/16 0901 12/19/16 0900 03/22/17 0857  AST 28 17 20   ALT 28 18 18   ALKPHOS 100 115 118  BILITOT 0.4 0.6 0.4  PROT 6.7 6.5 6.4  ALBUMIN 3.9 4.0 4.1   No results for input(s): LIPASE, AMYLASE in the last 8760 hours. No results for input(s): AMMONIA in the last 8760 hours. CBC:  Recent Labs  05/04/16 2132 01/18/17 1026 03/22/17 0857  WBC  --  10.9* 8.9  NEUTROABS  --  7,630 5,251  HGB 12.6 11.2* 11.5*  HCT 37.0 34.7* 36.1  MCV  --  94.3 93.5  PLT  --  242 242   Lipid Panel:  Recent Labs  05/11/16 0909 09/15/16 0901 03/22/17 0857  CHOL 147 159 138  HDL 27* 27* 25*  LDLCALC 92 95 86  TRIG 140 186* 134  CHOLHDL 5.4* 5.9* 5.5*   Lab Results  Component Value Date   HGBA1C 7.7 (H) 03/22/2017    Assessment/Plan 1. Bilateral primary osteoarthritis of knee - she agrees to try voltaren gel for her knees and cymbalta for arthritis pain and depression (to try to decrease xanax use) - diclofenac sodium (VOLTAREN) 1 % GEL; Apply 4 g topically 4 (four) times daily. To bilateral knees  Dispense: 100 g; Refill: 5 - DULoxetine (CYMBALTA) 30 MG capsule; Take 1 capsule (30 mg total) by mouth daily.  Dispense: 30 capsule; Refill: 3  2. Anxiety - has been out of xanax for a few days and could not get refill yet b/c not due - will decrease  xanax to bid and add cymbalta for depression and anxiety - DULoxetine (CYMBALTA) 30 MG capsule; Take 1 capsule (30 mg total) by mouth daily.  Dispense: 30 capsule; Refill: 3 - ALPRAZolam (XANAX) 0.5 MG tablet; Take 1 tablet (0.5 mg total) by mouth 2 (two) times daily as needed for anxiety or sleep.  Dispense: 60 tablet; Refill: 0--no refills given b/c I'd like to continue to reduce her dose in hopes of helping to prevent falls and help balance  3. Controlled type 2 diabetes with neuropathy (HCC) - neuropathy ongoing, cont low dose gabapentin at hs only  -cont lantus 42 units with humalog meal coverage, but increase dose of lunchtime insulin to 14 units due to evening highs, cont 10 before breakfast an 14 before supper -no difficulty with lows - CBC with Differential/Platelet; Future - Hemoglobin A1c; Future - COMPLETE METABOLIC PANEL WITH GFR; Future  4. Venous insufficiency of both lower  extremities -ongoing, left leg remains larger, tighter, with venous stasis changes -has had multiple venous dopplers negative for dvt or baker's cyst -has significant OA in both knees with left greater than right also likely a contributor -elevate feet at rest -discussed need for more help at home or a move to an assisted living, but she is not ready for that--daughter and son help her, but she lives alone   5. Chronic kidney disease, stage III (moderate) -Avoid nephrotoxic agents like nsaids, dose adjust renally excreted meds, hydrate. - Digoxin level; Future  6. controlled type 2 diabetes mellitus with stage 2 chronic kidney disease, with long-term current use of insulin (HCC) -see above - insulin lispro (HUMALOG KWIKPEN) 100 UNIT/ML KiwkPen; Inject 10 units before breakfast, 14 units before lunch and 14 units before suppler to control sugar. May use up to 3 more units per per meal as needed per sliding scale max units 45  Dispense: 20 pen; Refill: 3 - Hemoglobin A1c; Future  7. Hypothyroidism,  unspecified type -cont current levothyroxine 123mcg daily before breakfast - TSH; Future  8. Paroxysmal A-fib (HCC) -stable, cont lopressor, dig (low dose, check level), full dose aspirin; not on other anticoagulation due to VERY VERY frequent falls which are multifactorial (meds, neuropathy, diabetes, severe arthritis, increasing frailty at 89) - Digoxin level; Future  Labs/tests ordered:   Orders Placed This Encounter  Procedures  . Digoxin level    Standing Status:   Future    Standing Expiration Date:   09/27/2017  . CBC with Differential/Platelet    Standing Status:   Future    Standing Expiration Date:   09/27/2017  . Hemoglobin A1c    Standing Status:   Future    Standing Expiration Date:   09/27/2017  . COMPLETE METABOLIC PANEL WITH GFR    Standing Status:   Future    Standing Expiration Date:   09/27/2017  . TSH    Standing Status:   Future    Standing Expiration Date:   09/27/2017   Next appt:  3 mos with labs before  Daymeon Fischman L. Almina Schul, D.O. [facility] [address], [place] [address] Cell Phone (Mon-Fri 8am-5pm):  [phone] On Call:  [phone] & follow prompts after 5pm & weekends Office Phone:  [phone] Office Fax:  [phone]

## 2017-03-27 NOTE — Telephone Encounter (Signed)
Erin with [facility] called requested orders to follow up with patient due to missed appointment last week. Verbal orders given.

## 2017-03-28 ENCOUNTER — Other Ambulatory Visit: Payer: Self-pay | Admitting: Internal Medicine

## 2017-03-28 DIAGNOSIS — Z794 Long term (current) use of insulin: Secondary | ICD-10-CM | POA: Diagnosis not present

## 2017-03-28 DIAGNOSIS — E1151 Type 2 diabetes mellitus with diabetic peripheral angiopathy without gangrene: Secondary | ICD-10-CM | POA: Diagnosis not present

## 2017-03-28 DIAGNOSIS — E1143 Type 2 diabetes mellitus with diabetic autonomic (poly)neuropathy: Secondary | ICD-10-CM | POA: Diagnosis not present

## 2017-03-28 DIAGNOSIS — I509 Heart failure, unspecified: Secondary | ICD-10-CM | POA: Diagnosis not present

## 2017-03-28 DIAGNOSIS — M170 Bilateral primary osteoarthritis of knee: Secondary | ICD-10-CM | POA: Diagnosis not present

## 2017-03-28 DIAGNOSIS — I110 Hypertensive heart disease with heart failure: Secondary | ICD-10-CM | POA: Diagnosis not present

## 2017-03-28 DIAGNOSIS — Z8673 Personal history of transient ischemic attack (TIA), and cerebral infarction without residual deficits: Secondary | ICD-10-CM | POA: Diagnosis not present

## 2017-03-28 DIAGNOSIS — N183 Chronic kidney disease, stage 3 (moderate): Secondary | ICD-10-CM | POA: Diagnosis not present

## 2017-03-28 DIAGNOSIS — Z9181 History of falling: Secondary | ICD-10-CM | POA: Diagnosis not present

## 2017-03-28 DIAGNOSIS — E039 Hypothyroidism, unspecified: Secondary | ICD-10-CM

## 2017-03-28 DIAGNOSIS — E1122 Type 2 diabetes mellitus with diabetic chronic kidney disease: Secondary | ICD-10-CM | POA: Diagnosis not present

## 2017-03-28 DIAGNOSIS — R296 Repeated falls: Secondary | ICD-10-CM | POA: Diagnosis not present

## 2017-03-28 DIAGNOSIS — M48061 Spinal stenosis, lumbar region without neurogenic claudication: Secondary | ICD-10-CM | POA: Diagnosis not present

## 2017-04-03 DIAGNOSIS — I480 Paroxysmal atrial fibrillation: Secondary | ICD-10-CM | POA: Insufficient documentation

## 2017-04-03 DIAGNOSIS — M170 Bilateral primary osteoarthritis of knee: Secondary | ICD-10-CM | POA: Insufficient documentation

## 2017-04-07 ENCOUNTER — Other Ambulatory Visit: Payer: Self-pay | Admitting: Internal Medicine

## 2017-04-10 DIAGNOSIS — M170 Bilateral primary osteoarthritis of knee: Secondary | ICD-10-CM | POA: Diagnosis not present

## 2017-04-17 ENCOUNTER — Telehealth: Payer: Self-pay | Admitting: *Deleted

## 2017-04-17 ENCOUNTER — Other Ambulatory Visit: Payer: Self-pay | Admitting: Internal Medicine

## 2017-04-17 DIAGNOSIS — F419 Anxiety disorder, unspecified: Secondary | ICD-10-CM

## 2017-04-17 NOTE — Telephone Encounter (Signed)
Ok to go back to old regimen given poor response to dose reduction.

## 2017-04-17 NOTE — Telephone Encounter (Signed)
Son, [name] called and stated that his mother has been with him the last 2 weekends and is all out of sorts, Anxious and nervous. Stated that her Alprazolam was reduced and Cymbalta added. Stated that the Cymbalta is not working and he doesn't understand why it was reduced after being on it for so many years and her being 15 he doesn't think it needs to be changed. I explained to him that it was due prevention for falls and balance. He Stated that all this anxiousness and nervousness is going to cause her to have a heart attack. Son and patient wants to go back on the three times a day Alprazolam and would like a refill called to pharmacy. Please Advise.

## 2017-04-17 NOTE — Telephone Encounter (Signed)
RX called in for three times daily #90, patient's son aware

## 2017-04-17 NOTE — Telephone Encounter (Signed)
Patient son notified, LM on his VM

## 2017-04-19 ENCOUNTER — Other Ambulatory Visit: Payer: Self-pay | Admitting: Internal Medicine

## 2017-04-20 ENCOUNTER — Telehealth: Payer: Self-pay | Admitting: *Deleted

## 2017-04-20 NOTE — Telephone Encounter (Signed)
Suspect these effects are due to coming off of the cymbalta and should resolve over time.

## 2017-04-20 NOTE — Telephone Encounter (Signed)
Patient notified and agreed.  

## 2017-04-20 NOTE — Telephone Encounter (Signed)
Patient called and stated that she was having "jerky" feelings all over her body started last night, stopped this morning and now has started back this evening. Stated that she stopped taking the Cymbalta Tuesday night because it gave her a rash on her arms and nauseated. Stated that she feels her vitals are normal. Please Advise.

## 2017-04-21 ENCOUNTER — Ambulatory Visit: Payer: Self-pay | Admitting: Nurse Practitioner

## 2017-04-21 ENCOUNTER — Telehealth: Payer: Self-pay | Admitting: *Deleted

## 2017-04-21 NOTE — Telephone Encounter (Signed)
Dodd,son called and stated that patient was up all night with Increased anxiety, Jerking, Pain in arm and neck and back and Nervousness. Stated that it is withdrawals from being put on Cymbalta. He stated that patient should have NEVER been placed on this medication with all of its side effects. Son is not happy and going to look for another provider.  I offered an appointment for today and scheduled patient with Janett Billow but son called back and canceled and stated that he is not going to bring patient in just to check "vitals" Stated that he does not have much confidence in our office. Stated that with all the warnings and side effects from this medication a 83 year old should have never been placed on it.

## 2017-04-21 NOTE — Telephone Encounter (Signed)
Patient son notified

## 2017-04-21 NOTE — Telephone Encounter (Signed)
I'm sorry her son feels this way.  The geriatrics guidelines Elliot Cousin' criteria) recommend tapering people gradually off of anxiolytics (benzodiazepines) like alprazolam that she has been taking for many years and using other medications like SSRIs or SNRIs to decrease anxiety attacks and to treat some degree of depression.  Cymbalta also has the advantage of treating chronic pain which Karen [name] has suffered from.  As I explained at the appointment when I made some changes, I did this in her best interest to help prevent the falls she's been having (dangerous and putting her at risk for a fracture and nursing home placement) and to avoid worsening confusion that alprazolam causes in this age group over time (fortunately, she was not having this as far as I was made aware).  My hope was to make her feel better, not worse.  Most psychiatric medications can have these kind of effects when discontinued.

## 2017-04-24 ENCOUNTER — Other Ambulatory Visit: Payer: Self-pay | Admitting: Internal Medicine

## 2017-04-27 DIAGNOSIS — M5136 Other intervertebral disc degeneration, lumbar region: Secondary | ICD-10-CM | POA: Diagnosis not present

## 2017-04-27 DIAGNOSIS — M5442 Lumbago with sciatica, left side: Secondary | ICD-10-CM | POA: Diagnosis not present

## 2017-04-27 DIAGNOSIS — M545 Low back pain: Secondary | ICD-10-CM | POA: Diagnosis not present

## 2017-04-27 DIAGNOSIS — M1712 Unilateral primary osteoarthritis, left knee: Secondary | ICD-10-CM | POA: Diagnosis not present

## 2017-05-05 ENCOUNTER — Telehealth: Payer: Self-pay | Admitting: *Deleted

## 2017-05-05 NOTE — Telephone Encounter (Signed)
Received fax from [facility] [phone] Fax:[phone] for Refill Authorization Form for Humalog. Filled out and placed in Dr. Cyndi Lennert folder to review and sign.

## 2017-05-08 ENCOUNTER — Other Ambulatory Visit: Payer: Self-pay | Admitting: Nurse Practitioner

## 2017-05-22 DIAGNOSIS — M170 Bilateral primary osteoarthritis of knee: Secondary | ICD-10-CM | POA: Diagnosis not present

## 2017-05-23 ENCOUNTER — Other Ambulatory Visit: Payer: Self-pay | Admitting: Internal Medicine

## 2017-05-23 DIAGNOSIS — F419 Anxiety disorder, unspecified: Secondary | ICD-10-CM

## 2017-05-24 NOTE — Telephone Encounter (Signed)
rx called into pharmacy

## 2017-05-29 ENCOUNTER — Other Ambulatory Visit: Payer: Self-pay | Admitting: Internal Medicine

## 2017-05-29 DIAGNOSIS — R000 Tachycardia, unspecified: Secondary | ICD-10-CM

## 2017-05-30 ENCOUNTER — Encounter: Payer: Self-pay | Admitting: Internal Medicine

## 2017-05-30 ENCOUNTER — Ambulatory Visit (INDEPENDENT_AMBULATORY_CARE_PROVIDER_SITE_OTHER): Payer: Medicare Other | Admitting: Internal Medicine

## 2017-05-30 VITALS — BP 116/50 | HR 53 | Temp 98.0°F | Resp 12 | Ht 65.0 in | Wt 140.0 lb

## 2017-05-30 DIAGNOSIS — N3000 Acute cystitis without hematuria: Secondary | ICD-10-CM

## 2017-05-30 DIAGNOSIS — L309 Dermatitis, unspecified: Secondary | ICD-10-CM | POA: Diagnosis not present

## 2017-05-30 DIAGNOSIS — R300 Dysuria: Secondary | ICD-10-CM

## 2017-05-30 LAB — POCT URINALYSIS DIPSTICK
Bilirubin, UA: NEGATIVE
Glucose, UA: NEGATIVE
KETONES UA: NEGATIVE
Nitrite, UA: POSITIVE
PROTEIN UA: NEGATIVE
Spec Grav, UA: 1.02 (ref 1.010–1.025)
UROBILINOGEN UA: 0.2 U/dL
pH, UA: 6 (ref 5.0–8.0)

## 2017-05-30 MED ORDER — LEVOFLOXACIN 250 MG PO TABS: ORAL_TABLET | ORAL | 0 refills | Status: DC

## 2017-05-30 MED ORDER — FLUCONAZOLE 150 MG PO TABS: ORAL_TABLET | ORAL | 0 refills | Status: DC

## 2017-05-30 NOTE — Patient Instructions (Signed)
START LEVAQUIN 250MG  TODAY - take 2 tabs by mouth today with food then 1 tab daily x 6 days for bladder infection  Take diflucan as directed  Take probiotic daily while on antibiotic to keep colon heathy  Will call with urine culture results  Continue other medications as ordered  Follow up as scheduled with Dr Mariea Clonts or sooner if not feeling better

## 2017-05-30 NOTE — Progress Notes (Signed)
Patient ID: Karen [name], female   DOB: January 16, 1924, 83 y.o.   MRN: [id number]   [facility] clinic  Provider: Dr. Eulas Post  Code Status:  Goals of Care:  Advanced Directives 03/27/2017  Does Patient Have a Medical Advance Directive? No  Type of Advance Directive -  Does patient want to make changes to medical advance directive? -  Copy of [place] in Chart? -  Would patient like information on creating a medical advance directive? No - Patient declined     Chief Complaint  Patient presents with  . Acute Visit    Possible UTI- painful urination, chills, slight abdominal pain, urgency, and discomfort x 1 week.  Patient tried AZO and increased water intake   . Medication Refill    No refills needed     HPI: Patient is a 83 y.o. female seen today for an acute visit for due to painful urination x 1 week. She states urgency, pain and itching with urination. She states she was taking AZO to help with symptoms with some improvement but pain increased this morning. She states the pain is an 8/10.   She has a history of frequent UTIs and last urin culture had increased resistance to Augmentin.     Past Medical History:  Diagnosis Date  . A-fib ([place])   . Abnormal involuntary movements(781.0)   . Anemia   . Anxiety   . Anxiety state, unspecified   . Arthritis    body, neck  . Benign hypertensive heart disease   . Benign hypertensive heart disease with congestive heart failure ([place])   . Bladder disorder    over active  . Cancer ([place])    skin, removed  . Cervicalgia   . CHF (congestive heart failure) ([place])   . Chronic kidney disease, stage III (moderate) (HCC)   . Congestive heart failure, unspecified   . Contact dermatitis and other eczema, due to unspecified cause   . Corns and callosities   . Diarrhea   . Dyspnea 02/18/2015  . Edema   . Esophageal stricture   . Gastroparesis   . Gastroparesis   . GERD (gastroesophageal reflux disease)   . Hiatal hernia     . Hyperlipidemia   . Inflamed seborrheic keratosis   . Inflammatory disease of breast   . Irregular heartbeat   . Lumbago   . Mixed incontinence urge and stress (female)(female)   . Nausea alone   . Nonspecific (abnormal) findings on radiological and other examination of skull and head   . Osteoarthrosis, unspecified whether generalized or localized, unspecified site   . Other and unspecified hyperlipidemia   . Other B-complex deficiencies   . Other functional disorder of bladder   . Other malaise and fatigue   . Other specified visual disturbances   . Pain in joint, lower leg   . Palpitations   . Peripheral vascular disease, unspecified ([place])   . Postmenopausal atrophic vaginitis   . Reflux esophagitis   . Seborrheic keratosis   . Sleep apnea   . Spasm of muscle   . Spinal stenosis   . Spinal stenosis, unspecified region other than cervical   . Stricture and stenosis of esophagus   . Stroke ([place])   . TIA (transient ischemic attack) 2012  . Transient ischemic attack (TIA), and cerebral infarction without residual deficits(V12.54)   . Type II or unspecified type diabetes mellitus with renal manifestations, not stated as uncontrolled(250.40)   . Unspecified constipation   . Unspecified  essential hypertension   . Unspecified hereditary and idiopathic peripheral neuropathy   . Unspecified hypothyroidism   . Unspecified pruritic disorder   . Unspecified sleep apnea   . Unspecified vitamin D deficiency   . Urinary tract infection, site not specified   . Vitamin B12 deficiency     Past Surgical History:  Procedure Laterality Date  . ABDOMINAL HYSTERECTOMY    . BACK SURGERY     x 2  . CHOLECYSTECTOMY  1991  . esophageal  2004,2006   dilation, Dr Lyla Son  . [place]   bilateral cataract  . hammer toes    . HERNIA REPAIR  1984aaaaaaaa  . SKIN CANCER DESTRUCTION    . THYROIDECTOMY, PARTIAL  1965    Allergies  Allergen Reactions  . Celebrex [Celecoxib]  Rash  . Cymbalta [Duloxetine Hcl] Rash    Lip numbness,rash, leg and body jerks. Patient states " I though I was having a heart attack or stroke."   . Doxycycline Rash  . Lyrica [Pregabalin] Rash  . Avandia [Rosiglitazone]   . Macrodantin [Nitrofurantoin Macrocrystal]   . Sulfa Antibiotics   . Vioxx [Rofecoxib]   . Silicone Rash    Gets rash from the touch it.    Outpatient Encounter Prescriptions as of 05/30/2017  Medication Sig  . ALPRAZolam (XANAX) 0.5 MG tablet TAKE 1 TABLET BY MOUTH THREE TIMES A DAY  . aspirin 325 MG EC tablet Take 325 mg by mouth daily.  . clotrimazole-betamethasone (LOTRISONE) cream Apply 1 application topically 2 (two) times daily.  Marland Kitchen CRANBERRY PO Take 300 mg by mouth daily.  . [facility] NATURAL LUTEIN EYE HEALTH PO Take by mouth. One tablet once daily for eyes  . diclofenac sodium (VOLTAREN) 1 % GEL Apply 4 g topically 4 (four) times daily. To bilateral knees  . DIGOX 125 MCG tablet TAKE 1 TABLET BY MOUTH ON MONDAY, WEDNESDAY, FRIDAY, AND SATURDAY  . diphenhydrAMINE-zinc acetate (BENADRYL ITCH STOPPING) cream Apply 1 application topically 3 (three) times daily as needed.  . furosemide (LASIX) 40 MG tablet One daily to control edema  . gabapentin (NEURONTIN) 100 MG capsule Take 2 capsules by mouth at bedtime to help tremors.  Marland Kitchen HYDROcodone-acetaminophen (NORCO/VICODIN) 5-325 MG tablet TAKE 1 TABLET BY MOUTH 4 TIMES A DAY AS NEEDED FOR PAIN.  Marland Kitchen insulin lispro (HUMALOG KWIKPEN) 100 UNIT/ML KiwkPen Inject 10 units before breakfast, 14 units before lunch and 14 units before suppler to control sugar. May use up to 3 more units per per meal as needed per sliding scale max units 45  . Insulin Pen Needle (B-D ULTRAFINE III SHORT PEN) 31G X 8 MM MISC Use with administering insulin. Dx. E11.29  . LANTUS SOLOSTAR 100 UNIT/ML Solostar Pen Inject 42 Units into the skin daily at 10 pm.   . levothyroxine (SYNTHROID, LEVOTHROID) 125 MCG tablet TAKE ONE TABLET BY MOUTH EVERY MORNING  30 MINUTES BEFORE BREAKFAST  . metoprolol tartrate (LOPRESSOR) 50 MG tablet TAKE 1 TABLET BY MOUTH TWICE A DAY TO CONTROL HEART RATE.  . Multiple Vitamins-Minerals (PRESERVISION AREDS 2 PO) Take by mouth. 1 by mouth two times daily  . nitroGLYCERIN (NITROSTAT) 0.4 MG SL tablet Place 1 tablet (0.4 mg total) under the tongue every 5 (five) minutes as needed for chest pain.  Marland Kitchen omeprazole (PRILOSEC) 40 MG capsule TAKE ONE (1) CAPSULE BY MOUTH 2 TIMES DAILY FOR STOMACH  . ondansetron (ZOFRAN) 4 MG tablet TAKE ONE TABLET BY MOUTH EVERY 4 HOURS AS NEEDED  FOR NAUSEA/VOMITING  . ONE TOUCH ULTRA TEST test strip Use to test sugar three times daily dx E11.29  . Polyethyl Glycol-Propyl Glycol (SYSTANE OP) Apply to eye. One drop both eyes twice daily for dry eyes  . trimethoprim (TRIMPEX) 100 MG tablet TAKE ONE TABLET BY MOUTH EVERY NIGHT AT BEDTIME TO PREVENT BLADDER INFECTION  . Vitamin D, Ergocalciferol, (DRISDOL) 50000 units CAPS capsule TAKE 1 CAPSULE BY MOUTH ON THE SAME DAY EVERY WEEK AS DIRECTED.  Marland Kitchen VITAMIN E PO Take 400 Units by mouth daily.  . [DISCONTINUED] DULoxetine (CYMBALTA) 30 MG capsule Take 1 capsule (30 mg total) by mouth daily.   No facility-administered encounter medications on file as of 05/30/2017.     Review of Systems:  Review of Systems  Constitutional: Positive for chills. Negative for activity change, appetite change and fever.  HENT: Negative for congestion and sore throat.   Gastrointestinal: Positive for abdominal pain. Negative for abdominal distention, blood in stool, constipation, diarrhea, nausea and vomiting.  Genitourinary: Positive for difficulty urinating, dysuria, frequency, pelvic pain and urgency.  Skin: Positive for rash (groin area).  Psychiatric/Behavioral: Negative for agitation, behavioral problems, confusion and suicidal ideas.    Health Maintenance  Topic Date Due  . OPHTHALMOLOGY EXAM  09/26/2015  . FOOT EXAM  11/12/2015  . URINE MICROALBUMIN   09/20/2017  . HEMOGLOBIN A1C  09/22/2017  . TETANUS/TDAP  07/21/2023  . INFLUENZA VACCINE  Completed  . DEXA SCAN  Completed  . PNA vac Low Risk Adult  Completed    Physical Exam: Vitals:   05/30/17 1312  BP: (!) 116/50  Pulse: (!) 53  Resp: 12  Temp: 98 F (36.7 C)  TempSrc: Oral  SpO2: 93%  Weight: 140 lb (63.5 kg)  Height: 5\' 5"  (1.651 m)   Body mass index is 23.3 kg/m. Physical Exam  Constitutional: She appears well-developed and well-nourished.  Cardiovascular: Normal rate, regular rhythm, normal heart sounds and intact distal pulses.   No murmur heard. Pulmonary/Chest: Breath sounds normal. She is in respiratory distress. She has no wheezes.  Abdominal: Soft. Bowel sounds are normal. She exhibits no distension and no mass. There is tenderness.    Psychiatric: She has a normal mood and affect. Her behavior is normal.    Labs reviewed: Basic Metabolic Panel:  Recent Labs  07/18/16 0907 09/15/16 0901 12/19/16 0900 03/22/17 0857  NA 141 138 141 139  K 4.6 4.8 4.9 5.3  CL 104 100 104 101  CO2 31 29 26 26   GLUCOSE 100* 94 187* 153*  BUN 27* 36* 26* 28*  CREATININE 1.42* 1.78* 1.43* 1.57*  CALCIUM 8.8 9.0 8.8 8.7  TSH 1.46 4.27 1.48  --    Liver Function Tests:  Recent Labs  09/15/16 0901 12/19/16 0900 03/22/17 0857  AST 28 17 20   ALT 28 18 18   ALKPHOS 100 115 118  BILITOT 0.4 0.6 0.4  PROT 6.7 6.5 6.4  ALBUMIN 3.9 4.0 4.1   No results for input(s): LIPASE, AMYLASE in the last 8760 hours. No results for input(s): AMMONIA in the last 8760 hours. CBC:  Recent Labs  01/18/17 1026 03/22/17 0857  WBC 10.9* 8.9  NEUTROABS 7,630 5,251  HGB 11.2* 11.5*  HCT 34.7* 36.1  MCV 94.3 93.5  PLT 242 242   Lipid Panel:  Recent Labs  09/15/16 0901 03/22/17 0857  CHOL 159 138  HDL 27* 25*  LDLCALC 95 86  TRIG 186* 134  CHOLHDL 5.9* 5.5*   Lab Results  Component Value Date   HGBA1C 7.7 (H) 03/22/2017    Procedures since last visit: No  results found.  Assessment/Plan 1. Acute cystitis without hematuria Start levaquin x 7 days for UTI. Start OTC probiotic for GI health. - levofloxacin (LEVAQUIN) 250 MG tablet; Take 2 tabs po on day 1 then take 1 tab po daily x 6 days  Dispense: 8 tablet; Refill: 0  2. Dysuria Send out urine culture to check for sensitivity to antibiotic - POC Urinalysis Dipstick - Urine Culture  Will call with results from UC.   Labs/tests ordered:  Urine Culture Next appt:  07/11/2017

## 2017-05-30 NOTE — Progress Notes (Signed)
Patient ID: Karen [name], female   DOB: 12-28-1923, 83 y.o.   MRN: [id number]    Location:  [facility] Place of Service: OFFICE  Chief Complaint  Patient presents with  . Acute Visit    Possible UTI- painful urination, chills, slight abdominal pain, urgency, and discomfort x 1 week.  Patient tried AZO and increased water intake   . Medication Refill    No refills needed     HPI:  83 yo female seen today for dysuria. She reports 3 day hx worsening dysruia, decreased output, chills. No N/V. Occasional abdominal pain. No fever. Similar sx's with UTI in past. augmentin changed to cipro due to resistance at that time. She c/o rash in groin since taking cymbalta. Rash is itchy  DM - borderline controlled on humalog and lantus. A1c 7.7%    Past Medical History:  Diagnosis Date  . A-fib ([place])   . Abnormal involuntary movements(781.0)   . Anemia   . Anxiety   . Anxiety state, unspecified   . Arthritis    body, neck  . Benign hypertensive heart disease   . Benign hypertensive heart disease with congestive heart failure ([place])   . Bladder disorder    over active  . Cancer ([place])    skin, removed  . Cervicalgia   . CHF (congestive heart failure) ([place])   . Chronic kidney disease, stage III (moderate) (HCC)   . Congestive heart failure, unspecified   . Contact dermatitis and other eczema, due to unspecified cause   . Corns and callosities   . Diarrhea   . Dyspnea 02/18/2015  . Edema   . Esophageal stricture   . Gastroparesis   . Gastroparesis   . GERD (gastroesophageal reflux disease)   . Hiatal hernia   . Hyperlipidemia   . Inflamed seborrheic keratosis   . Inflammatory disease of breast   . Irregular heartbeat   . Lumbago   . Mixed incontinence urge and stress (female)(female)   . Nausea alone   . Nonspecific (abnormal) findings on radiological and other examination of skull and head   . Osteoarthrosis, unspecified whether generalized or localized, unspecified site   . Other  and unspecified hyperlipidemia   . Other B-complex deficiencies   . Other functional disorder of bladder   . Other malaise and fatigue   . Other specified visual disturbances   . Pain in joint, lower leg   . Palpitations   . Peripheral vascular disease, unspecified ([place])   . Postmenopausal atrophic vaginitis   . Reflux esophagitis   . Seborrheic keratosis   . Sleep apnea   . Spasm of muscle   . Spinal stenosis   . Spinal stenosis, unspecified region other than cervical   . Stricture and stenosis of esophagus   . Stroke ([place])   . TIA (transient ischemic attack) 2012  . Transient ischemic attack (TIA), and cerebral infarction without residual deficits(V12.54)   . Type II or unspecified type diabetes mellitus with renal manifestations, not stated as uncontrolled(250.40)   . Unspecified constipation   . Unspecified essential hypertension   . Unspecified hereditary and idiopathic peripheral neuropathy   . Unspecified hypothyroidism   . Unspecified pruritic disorder   . Unspecified sleep apnea   . Unspecified vitamin D deficiency   . Urinary tract infection, site not specified   . Vitamin B12 deficiency     Past Surgical History:  Procedure Laterality Date  . ABDOMINAL HYSTERECTOMY    . BACK SURGERY  x 2  . CHOLECYSTECTOMY  1991  . esophageal  2004,2006   dilation, Dr Victorino Dike  . EYE SURGERY  1994, 1997   bilateral cataract  . hammer toes    . HERNIA REPAIR  1984aaaaaaaa  . SKIN CANCER DESTRUCTION    . THYROIDECTOMY, PARTIAL  1965    Patient Care Team: Kermit Balo, DO as PCP - General (Geriatric Medicine) Jorge Mandril, DPM (Inactive) as Attending Physician (Podiatry) Teryl Lucy, MD as Consulting Physician (Orthopedic Surgery)  Social History   Social History  . Marital status: Widowed    Spouse name: N/A  . Number of children: 3  . Years of education: N/A   Occupational History  . retired    Social History Main Topics  . Smoking status:  Never Smoker  . Smokeless tobacco: Never Used  . Alcohol use No  . Drug use: No  . Sexual activity: No   Other Topics Concern  . Not on file   Social History Narrative  . No narrative on file     reports that she has never smoked. She has never used smokeless tobacco. She reports that she does not drink alcohol or use drugs.  Family History  Problem Relation Age of Onset  . Cancer Mother        ? stomach  . Heart disease Father   . Hyperlipidemia Father   . Hypertension Father   . Heart attack Father   . Heart disease Brother   . Kidney disease Daughter   . Heart disease Brother   . Cancer Son    Family Status  Relation Status  . Mother Deceased  . Father Deceased  . Brother Alive  . Daughter Alive  . Brother Alive  . Son Alive  . Sister Alive  . Son Alive  . Brother Alive     Allergies  Allergen Reactions  . Celebrex [Celecoxib] Rash  . Cymbalta [Duloxetine Hcl] Rash    Lip numbness,rash, leg and body jerks. Patient states " I though I was having a heart attack or stroke."   . Doxycycline Rash  . Lyrica [Pregabalin] Rash  . Avandia [Rosiglitazone]   . Macrodantin [Nitrofurantoin Macrocrystal]   . Sulfa Antibiotics   . Vioxx [Rofecoxib]   . Silicone Rash    Gets rash from the touch it.    Medications: Patient's Medications  New Prescriptions   No medications on file  Previous Medications   ALPRAZOLAM (XANAX) 0.5 MG TABLET    TAKE 1 TABLET BY MOUTH THREE TIMES A DAY   ASPIRIN 325 MG EC TABLET    Take 325 mg by mouth daily.   CLOTRIMAZOLE-BETAMETHASONE (LOTRISONE) CREAM    Apply 1 application topically 2 (two) times daily.   CRANBERRY PO    Take 300 mg by mouth daily.   [facility] NATURAL LUTEIN EYE HEALTH PO    Take by mouth. One tablet once daily for eyes   DICLOFENAC SODIUM (VOLTAREN) 1 % GEL    Apply 4 g topically 4 (four) times daily. To bilateral knees   DIGOX 125 MCG TABLET    TAKE 1 TABLET BY MOUTH ON MONDAY, WEDNESDAY, FRIDAY, AND SATURDAY    DIPHENHYDRAMINE-ZINC ACETATE (BENADRYL ITCH STOPPING) CREAM    Apply 1 application topically 3 (three) times daily as needed.   FUROSEMIDE (LASIX) 40 MG TABLET    One daily to control edema   GABAPENTIN (NEURONTIN) 100 MG CAPSULE    Take 2 capsules by mouth at bedtime  to help tremors.   HYDROCODONE-ACETAMINOPHEN (NORCO/VICODIN) 5-325 MG TABLET    TAKE 1 TABLET BY MOUTH 4 TIMES A DAY AS NEEDED FOR PAIN.   INSULIN LISPRO (HUMALOG KWIKPEN) 100 UNIT/ML KIWKPEN    Inject 10 units before breakfast, 14 units before lunch and 14 units before suppler to control sugar. May use up to 3 more units per per meal as needed per sliding scale max units 45   INSULIN PEN NEEDLE (B-D ULTRAFINE III SHORT PEN) 31G X 8 MM MISC    Use with administering insulin. Dx. E11.29   LANTUS SOLOSTAR 100 UNIT/ML SOLOSTAR PEN    Inject 42 Units into the skin daily at 10 pm.    LEVOTHYROXINE (SYNTHROID, LEVOTHROID) 125 MCG TABLET    TAKE ONE TABLET BY MOUTH EVERY MORNING 30 MINUTES BEFORE BREAKFAST   METOPROLOL TARTRATE (LOPRESSOR) 50 MG TABLET    TAKE 1 TABLET BY MOUTH TWICE A DAY TO CONTROL HEART RATE.   MULTIPLE VITAMINS-MINERALS (PRESERVISION AREDS 2 PO)    Take by mouth. 1 by mouth two times daily   NITROGLYCERIN (NITROSTAT) 0.4 MG SL TABLET    Place 1 tablet (0.4 mg total) under the tongue every 5 (five) minutes as needed for chest pain.   OMEPRAZOLE (PRILOSEC) 40 MG CAPSULE    TAKE ONE (1) CAPSULE BY MOUTH 2 TIMES DAILY FOR STOMACH   ONDANSETRON (ZOFRAN) 4 MG TABLET    TAKE ONE TABLET BY MOUTH EVERY 4 HOURS AS NEEDED FOR NAUSEA/VOMITING   ONE TOUCH ULTRA TEST TEST STRIP    Use to test sugar three times daily dx E11.29   POLYETHYL GLYCOL-PROPYL GLYCOL (SYSTANE OP)    Apply to eye. One drop both eyes twice daily for dry eyes   TRIMETHOPRIM (TRIMPEX) 100 MG TABLET    TAKE ONE TABLET BY MOUTH EVERY NIGHT AT BEDTIME TO PREVENT BLADDER INFECTION   VITAMIN D, ERGOCALCIFEROL, (DRISDOL) 50000 UNITS CAPS CAPSULE    TAKE 1 CAPSULE BY MOUTH  ON THE SAME DAY EVERY WEEK AS DIRECTED.   VITAMIN E PO    Take 400 Units by mouth daily.  Modified Medications   No medications on file  Discontinued Medications   DULOXETINE (CYMBALTA) 30 MG CAPSULE    Take 1 capsule (30 mg total) by mouth daily.    Review of Systems  Constitutional: Positive for appetite change and chills.  Genitourinary: Positive for dysuria and frequency.       Vaginal itching  Musculoskeletal: Positive for arthralgias and gait problem.  Skin: Positive for rash.  All other systems reviewed and are negative.   Vitals:   05/30/17 1312  BP: (!) 116/50  Pulse: (!) 53  Resp: 12  Temp: 98 F (36.7 C)  TempSrc: Oral  SpO2: 93%  Weight: 140 lb (63.5 kg)  Height: 5\' 5"  (1.651 m)   Body mass index is 23.3 kg/m.  Physical Exam  Constitutional: She is oriented to person, place, and time.  Neck: Neck supple.  Cardiovascular: Normal rate, regular rhythm and intact distal pulses.  Exam reveals no gallop and no friction rub.   Murmur (1/6 SEM) heard. +1 pitting LE edema b/l. No calf TTP  Pulmonary/Chest: Effort normal and breath sounds normal. No respiratory distress. She has no wheezes. She has no rales.  Abdominal: Soft. Bowel sounds are normal. She exhibits distension. She exhibits no mass. There is tenderness (epigastric TTP; suprapubic TTP). There is no rebound and no guarding.  No CVAT  Musculoskeletal: She exhibits edema.  Neurological:  She is alert and oriented to person, place, and time.  Skin: Skin is warm and dry. Rash (b/l tinea inguinal area with central clearing; no secondary signs of infection; extends to perinuem and buttocks) noted.  Psychiatric: She has a normal mood and affect. Her behavior is normal. Thought content normal.     Labs reviewed: Office Visit on 05/30/2017  Component Date Value Ref Range Status  . Color, UA 05/30/2017 Light Yellow   Final  . Clarity, UA 05/30/2017 Cloudy   Final  . Glucose, UA 05/30/2017 Neg   Final  .  Bilirubin, UA 05/30/2017 Neg   Final  . Ketones, UA 05/30/2017 Neg   Final  . Spec Grav, UA 05/30/2017 1.020  1.010 - 1.025 Final  . Blood, UA 05/30/2017 Moderate   Final  . pH, UA 05/30/2017 6.0  5.0 - 8.0 Final  . Protein, UA 05/30/2017 Neg   Final  . Urobilinogen, UA 05/30/2017 0.2  0.2 or 1.0 E.U./dL Final  . Nitrite, UA 05/30/2017 Positive   Final  . Leukocytes, UA 05/30/2017 Large (3+)* Negative Final   Sent for culture   Appointment on 03/22/2017  Component Date Value Ref Range Status  . WBC 03/22/2017 8.9  3.8 - 10.8 K/uL Final  . RBC 03/22/2017 3.86  3.80 - 5.10 MIL/uL Final  . Hemoglobin 03/22/2017 11.5* 11.7 - 15.5 g/dL Final  . HCT 03/22/2017 36.1  35.0 - 45.0 % Final  . MCV 03/22/2017 93.5  80.0 - 100.0 fL Final  . MCH 03/22/2017 29.8  27.0 - 33.0 pg Final  . MCHC 03/22/2017 31.9* 32.0 - 36.0 g/dL Final  . RDW 03/22/2017 13.6  11.0 - 15.0 % Final  . Platelets 03/22/2017 242  140 - 400 K/uL Final  . MPV 03/22/2017 10.6  7.5 - 12.5 fL Final  . Neutro Abs 03/22/2017 5251  1,500 - 7,800 cells/uL Final  . Lymphs Abs 03/22/2017 2670  850 - 3,900 cells/uL Final  . Monocytes Absolute 03/22/2017 623  200 - 950 cells/uL Final  . Eosinophils Absolute 03/22/2017 356  15 - 500 cells/uL Final  . Basophils Absolute 03/22/2017 0  0 - 200 cells/uL Final  . Neutrophils Relative % 03/22/2017 59  % Final  . Lymphocytes Relative 03/22/2017 30  % Final  . Monocytes Relative 03/22/2017 7  % Final  . Eosinophils Relative 03/22/2017 4  % Final  . Basophils Relative 03/22/2017 0  % Final  . Smear Review 03/22/2017 Criteria for review not met   Final  . Sodium 03/22/2017 139  135 - 146 mmol/L Final  . Potassium 03/22/2017 5.3  3.5 - 5.3 mmol/L Final  . Chloride 03/22/2017 101  98 - 110 mmol/L Final  . CO2 03/22/2017 26  20 - 32 mmol/L Final   Comment: ** Please note change in reference range(s). **     . Glucose, Bld 03/22/2017 153* 65 - 99 mg/dL Final  . BUN 03/22/2017 28* 7 - 25 mg/dL  Final  . Creat 03/22/2017 1.57* 0.60 - 0.88 mg/dL Final   Comment:   For patients > or = 81 years of age: The upper reference limit for Creatinine is approximately 13% higher for people identified as African-American.     . Total Bilirubin 03/22/2017 0.4  0.2 - 1.2 mg/dL Final  . Alkaline Phosphatase 03/22/2017 118  33 - 130 U/L Final  . AST 03/22/2017 20  10 - 35 U/L Final  . ALT 03/22/2017 18  6 - 29 U/L Final  .  Total Protein 03/22/2017 6.4  6.1 - 8.1 g/dL Final  . Albumin 03/22/2017 4.1  3.6 - 5.1 g/dL Final  . Calcium 03/22/2017 8.7  8.6 - 10.4 mg/dL Final  . GFR, Est African American 03/22/2017 33* >=60 mL/min Final  . GFR, Est Non African American 03/22/2017 29* >=60 mL/min Final  . Cholesterol 03/22/2017 138  <200 mg/dL Final  . Triglycerides 03/22/2017 134  <150 mg/dL Final  . HDL 03/22/2017 25* >50 mg/dL Final  . Total CHOL/HDL Ratio 03/22/2017 5.5* <5.0 Ratio Final  . VLDL 03/22/2017 27  <30 mg/dL Final  . LDL Cholesterol 03/22/2017 86  <100 mg/dL Final  . Hgb A1c MFr Bld 03/22/2017 7.7* <5.7 % Final   Comment:   For someone without known diabetes, a hemoglobin A1c value of 6.5% or greater indicates that they may have diabetes and this should be confirmed with a follow-up test.   For someone with known diabetes, a value <7% indicates that their diabetes is well controlled and a value greater than or equal to 7% indicates suboptimal control. A1c targets should be individualized based on duration of diabetes, age, comorbid conditions, and other considerations.   Currently, no consensus exists for use of hemoglobin A1c for diagnosis of diabetes for children.     . Mean Plasma Glucose 03/22/2017 174  mg/dL Final    No results found.   Assessment/Plan   ICD-10-CM   1. Acute cystitis without hematuria N30.00 levofloxacin (LEVAQUIN) 250 MG tablet  2. Dysuria R30.0 POC Urinalysis Dipstick    Urine Culture  3. Dermatitis L30.9    tinea   START LEVAQUIN '250MG'$   TODAY - take 2 tabs by mouth today with food then 1 tab daily x 6 days for bladder infection  Take diflucan as directed  Take probiotic daily while on antibiotic to keep colon heathy  Will call with urine culture results  Continue other medications as ordered  Follow up as scheduled with Dr Mariea Clonts or sooner if not feeling better  [facility] S. Perlie Gold  [facility] [address] [place], [place] [address] [phone] Cell (Monday-Friday 8 AM - 5 PM) [phone] After 5 PM and follow prompts

## 2017-05-31 ENCOUNTER — Ambulatory Visit: Payer: Self-pay | Admitting: Nurse Practitioner

## 2017-06-01 ENCOUNTER — Telehealth: Payer: Self-pay

## 2017-06-01 DIAGNOSIS — IMO0002 Reserved for concepts with insufficient information to code with codable children: Secondary | ICD-10-CM

## 2017-06-01 DIAGNOSIS — E1165 Type 2 diabetes mellitus with hyperglycemia: Principal | ICD-10-CM

## 2017-06-01 DIAGNOSIS — N182 Chronic kidney disease, stage 2 (mild): Principal | ICD-10-CM

## 2017-06-01 DIAGNOSIS — Z794 Long term (current) use of insulin: Principal | ICD-10-CM

## 2017-06-01 DIAGNOSIS — E1122 Type 2 diabetes mellitus with diabetic chronic kidney disease: Secondary | ICD-10-CM

## 2017-06-01 MED ORDER — LANTUS SOLOSTAR 100 UNIT/ML ~~LOC~~ SOPN: 42.0000 [IU] | PEN_INJECTOR | Freq: Every day | SUBCUTANEOUS | 11 refills | Status: DC

## 2017-06-01 MED ORDER — INSULIN LISPRO 100 UNIT/ML (KWIKPEN): PEN_INJECTOR | SUBCUTANEOUS | 3 refills | Status: DC

## 2017-06-01 NOTE — Telephone Encounter (Signed)
Incoming fax from [employer] Patient Assistance Program states:  Our records indicate that your patient's enrollment in the [facility] Patient Assistance Program is expiring soon: 07/31/17 for Humalog.  You can obtain a Scientist, forensic at [email or web address] or by contacting [employer] at [phone]  Form obtained, RX for Humalog printed, provider portion completed.  Side note: rx for Lanuts Solostar was printed in error and shredded

## 2017-06-02 LAB — URINE CULTURE
MICRO NUMBER: 81216825
SPECIMEN QUALITY: ADEQUATE

## 2017-06-02 NOTE — Telephone Encounter (Signed)
Spoke with patient, patient aware I will mail her the application to complete her portion and to send back to [facility] for further processing (fax to [place])

## 2017-06-08 ENCOUNTER — Other Ambulatory Visit: Payer: Self-pay | Admitting: Internal Medicine

## 2017-06-15 ENCOUNTER — Other Ambulatory Visit: Payer: Self-pay | Admitting: Internal Medicine

## 2017-06-15 DIAGNOSIS — R251 Tremor, unspecified: Secondary | ICD-10-CM

## 2017-06-15 MED ORDER — GABAPENTIN 100 MG PO CAPS: ORAL_CAPSULE | ORAL | 1 refills | Status: DC

## 2017-06-26 ENCOUNTER — Other Ambulatory Visit: Payer: Self-pay | Admitting: Internal Medicine

## 2017-06-26 DIAGNOSIS — F419 Anxiety disorder, unspecified: Secondary | ICD-10-CM

## 2017-07-07 ENCOUNTER — Other Ambulatory Visit: Payer: Self-pay | Admitting: Internal Medicine

## 2017-07-11 ENCOUNTER — Ambulatory Visit: Payer: Self-pay

## 2017-07-11 ENCOUNTER — Other Ambulatory Visit: Payer: Self-pay

## 2017-07-11 ENCOUNTER — Ambulatory Visit (INDEPENDENT_AMBULATORY_CARE_PROVIDER_SITE_OTHER): Payer: Medicare Other

## 2017-07-11 ENCOUNTER — Other Ambulatory Visit: Payer: Medicare Other

## 2017-07-11 VITALS — BP 150/60 | HR 61 | Temp 98.0°F | Ht 65.0 in | Wt 143.2 lb

## 2017-07-11 DIAGNOSIS — E039 Hypothyroidism, unspecified: Secondary | ICD-10-CM | POA: Diagnosis not present

## 2017-07-11 DIAGNOSIS — E1122 Type 2 diabetes mellitus with diabetic chronic kidney disease: Secondary | ICD-10-CM | POA: Diagnosis not present

## 2017-07-11 DIAGNOSIS — E1140 Type 2 diabetes mellitus with diabetic neuropathy, unspecified: Secondary | ICD-10-CM

## 2017-07-11 DIAGNOSIS — N183 Chronic kidney disease, stage 3 unspecified: Secondary | ICD-10-CM

## 2017-07-11 DIAGNOSIS — I480 Paroxysmal atrial fibrillation: Secondary | ICD-10-CM | POA: Diagnosis not present

## 2017-07-11 DIAGNOSIS — N182 Chronic kidney disease, stage 2 (mild): Secondary | ICD-10-CM

## 2017-07-11 DIAGNOSIS — E1165 Type 2 diabetes mellitus with hyperglycemia: Secondary | ICD-10-CM

## 2017-07-11 DIAGNOSIS — Z0000 Encounter for general adult medical examination without abnormal findings: Secondary | ICD-10-CM | POA: Diagnosis not present

## 2017-07-11 DIAGNOSIS — Z794 Long term (current) use of insulin: Secondary | ICD-10-CM | POA: Diagnosis not present

## 2017-07-11 DIAGNOSIS — IMO0002 Reserved for concepts with insufficient information to code with codable children: Secondary | ICD-10-CM

## 2017-07-11 NOTE — Patient Instructions (Signed)
Karen [name] , Thank you for taking time to come for your Medicare Wellness Visit. I appreciate your ongoing commitment to your health goals. Please review the following plan we discussed and let me know if I can assist you in the future.   Screening recommendations/referrals: Colonoscopy excluded, you are over age 81 Mammogram excluded, you are over age 65 Bone Density up to date Recommended yearly ophthalmology/optometry visit for glaucoma screening and checkup Recommended yearly dental visit for hygiene and checkup  Vaccinations: Influenza vaccine up to date. Due 2019 fall season Pneumococcal vaccine up to date Tdap vaccine up to date. Due 07/21/2023 Shingles vaccine due, please check with insurance    Advanced directives: Advance directive discussed with you today. I have provided a copy for you to complete at home and have notarized. Once this is complete please bring a copy in to our office so we can scan it into your chart.  Conditions/risks identified: Rash on knees, arms and thighs  Next appointment: Dr. Eulas Post 07/14/2017 @ 11:30am             Rich Reining, RN 07/12/2018 @ 10:45am   Preventive Care 65 Years and Older, Female Preventive care refers to lifestyle choices and visits with your health care provider that can promote health and wellness. What does preventive care include?  A yearly physical exam. This is also called an annual well check.  Dental exams once or twice a year.  Routine eye exams. Ask your health care provider how often you should have your eyes checked.  Personal lifestyle choices, including:  Daily care of your teeth and gums.  Regular physical activity.  Eating a healthy diet.  Avoiding tobacco and drug use.  Limiting alcohol use.  Practicing safe sex.  Taking low-dose aspirin every day.  Taking vitamin and mineral supplements as recommended by your health care provider. What happens during an annual well check? The services and  screenings done by your health care provider during your annual well check will depend on your age, overall health, lifestyle risk factors, and family history of disease. Counseling  Your health care provider may ask you questions about your:  Alcohol use.  Tobacco use.  Drug use.  Emotional well-being.  Home and relationship well-being.  Sexual activity.  Eating habits.  History of falls.  Memory and ability to understand (cognition).  Work and work Statistician.  Reproductive health. Screening  You may have the following tests or measurements:  Height, weight, and BMI.  Blood pressure.  Lipid and cholesterol levels. These may be checked every 5 years, or more frequently if you are over 48 years old.  Skin check.  Lung cancer screening. You may have this screening every year starting at age 9 if you have a 30-pack-year history of smoking and currently smoke or have quit within the past 15 years.  Fecal occult blood test (FOBT) of the stool. You may have this test every year starting at age 54.  Flexible sigmoidoscopy or colonoscopy. You may have a sigmoidoscopy every 5 years or a colonoscopy every 10 years starting at age 66.  Hepatitis C blood test.  Hepatitis B blood test.  Sexually transmitted disease (STD) testing.  Diabetes screening. This is done by checking your blood sugar (glucose) after you have not eaten for a while (fasting). You may have this done every 1-3 years.  Bone density scan. This is done to screen for osteoporosis. You may have this done starting at age 69.  Mammogram. This may  be done every 1-2 years. Talk to your health care provider about how often you should have regular mammograms. Talk with your health care provider about your test results, treatment options, and if necessary, the need for more tests. Vaccines  Your health care provider may recommend certain vaccines, such as:  Influenza vaccine. This is recommended every  year.  Tetanus, diphtheria, and acellular pertussis (Tdap, Td) vaccine. You may need a Td booster every 10 years.  Zoster vaccine. You may need this after age 31.  Pneumococcal 13-valent conjugate (PCV13) vaccine. One dose is recommended after age 65.  Pneumococcal polysaccharide (PPSV23) vaccine. One dose is recommended after age 3. Talk to your health care provider about which screenings and vaccines you need and how often you need them. This information is not intended to replace advice given to you by your health care provider. Make sure you discuss any questions you have with your health care provider. Document Released: 08/14/2015 Document Revised: 04/06/2016 Document Reviewed: 05/19/2015 Elsevier Interactive Patient Education  2017 [place] Prevention in the Home Falls can cause injuries. They can happen to people of all ages. There are many things you can do to make your home safe and to help prevent falls. What can I do on the outside of my home?  Regularly fix the edges of walkways and driveways and fix any cracks.  Remove anything that might make you trip as you walk through a door, such as a raised step or threshold.  Trim any bushes or trees on the path to your home.  Use bright outdoor lighting.  Clear any walking paths of anything that might make someone trip, such as rocks or tools.  Regularly check to see if handrails are loose or broken. Make sure that both sides of any steps have handrails.  Any raised decks and porches should have guardrails on the edges.  Have any leaves, snow, or ice cleared regularly.  Use sand or salt on walking paths during winter.  Clean up any spills in your garage right away. This includes oil or grease spills. What can I do in the bathroom?  Use night lights.  Install grab bars by the toilet and in the tub and shower. Do not use towel bars as grab bars.  Use non-skid mats or decals in the tub or shower.  If you  need to sit down in the shower, use a plastic, non-slip stool.  Keep the floor dry. Clean up any water that spills on the floor as soon as it happens.  Remove soap buildup in the tub or shower regularly.  Attach bath mats securely with double-sided non-slip rug tape.  Do not have throw rugs and other things on the floor that can make you trip. What can I do in the bedroom?  Use night lights.  Make sure that you have a light by your bed that is easy to reach.  Do not use any sheets or blankets that are too big for your bed. They should not hang down onto the floor.  Have a firm chair that has side arms. You can use this for support while you get dressed.  Do not have throw rugs and other things on the floor that can make you trip. What can I do in the kitchen?  Clean up any spills right away.  Avoid walking on wet floors.  Keep items that you use a lot in easy-to-reach places.  If you need to reach something above you,  use a strong step stool that has a grab bar.  Keep electrical cords out of the way.  Do not use floor polish or wax that makes floors slippery. If you must use wax, use non-skid floor wax.  Do not have throw rugs and other things on the floor that can make you trip. What can I do with my stairs?  Do not leave any items on the stairs.  Make sure that there are handrails on both sides of the stairs and use them. Fix handrails that are broken or loose. Make sure that handrails are as long as the stairways.  Check any carpeting to make sure that it is firmly attached to the stairs. Fix any carpet that is loose or worn.  Avoid having throw rugs at the top or bottom of the stairs. If you do have throw rugs, attach them to the floor with carpet tape.  Make sure that you have a light switch at the top of the stairs and the bottom of the stairs. If you do not have them, ask someone to add them for you. What else can I do to help prevent falls?  Wear shoes  that:  Do not have high heels.  Have rubber bottoms.  Are comfortable and fit you well.  Are closed at the toe. Do not wear sandals.  If you use a stepladder:  Make sure that it is fully opened. Do not climb a closed stepladder.  Make sure that both sides of the stepladder are locked into place.  Ask someone to hold it for you, if possible.  Clearly mark and make sure that you can see:  Any grab bars or handrails.  First and last steps.  Where the edge of each step is.  Use tools that help you move around (mobility aids) if they are needed. These include:  Canes.  Walkers.  Scooters.  Crutches.  Turn on the lights when you go into a dark area. Replace any light bulbs as soon as they burn out.  Set up your furniture so you have a clear path. Avoid moving your furniture around.  If any of your floors are uneven, fix them.  If there are any pets around you, be aware of where they are.  Review your medicines with your doctor. Some medicines can make you feel dizzy. This can increase your chance of falling. Ask your doctor what other things that you can do to help prevent falls. This information is not intended to replace advice given to you by your health care provider. Make sure you discuss any questions you have with your health care provider. Document Released: 05/14/2009 Document Revised: 12/24/2015 Document Reviewed: 08/22/2014 Elsevier Interactive Patient Education  2017 [employer].

## 2017-07-11 NOTE — Progress Notes (Signed)
Subjective:   Karen [name] is a 83 y.o. female who presents for Medicare Annual (Subsequent) preventive examination  Last AWV-05/20/2016    Objective:     Vitals: BP (!) 150/60 (BP Location: Left Arm, Patient Position: Sitting) Comment: hasn't taken BP medication yet  Pulse 61   Temp 98 F (36.7 C) (Oral)   Ht 5\' 5"  (1.651 m)   Wt 143 lb 3.2 oz (65 kg)   SpO2 94%   BMI 23.83 kg/m   Body mass index is 23.83 kg/m.  Advanced Directives 07/11/2017 03/27/2017 01/18/2017 12/22/2016 09/20/2016 09/13/2016 09/08/2016  Does Patient Have a Medical Advance Directive? Yes No Yes Yes Yes Yes Yes  Type of Advance Directive Living will - [place];Living will [place];Living will - Living will [place];Living will  Does patient want to make changes to medical advance directive? No - Patient declined - - - - - -  Copy of Press photographer in Chart? - - No - copy requested No - copy requested No - copy requested - No - copy requested  Would patient like information on creating a medical advance directive? - No - Patient declined - - - - -    Tobacco Social History   Tobacco Use  Smoking Status Never Smoker  Smokeless Tobacco Never Used     Counseling given: Not Answered   Clinical Intake:  Pre-visit preparation completed: No  Pain : 0-10 Pain Score: 9  Pain Type: Chronic pain Pain Location: Knee Pain Orientation: Left Pain Descriptors / Indicators: Aching Pain Onset: More than a month ago Pain Frequency: Intermittent     Diabetes: Yes CBG done?: No Did pt. bring in CBG monitor from home?: No  Activities of Daily Living: Independent Ambulation: Independent with device- listed below Home Assistive Devices/Equipment: Angela Burke (specify Type), Eyeglasses, Dentures (specify type)(Full top denture, partial bottom) Medication Administration: Independent Home Management: Independent  Barriers to Care  Management & Learning: None  Do you feel unsafe in your current relationship?: No Do you feel physically threatened by others?: No Anyone hurting you at home, work, or school?: No Unable to ask?: No Information provided on Community resources: No  How often do you need to have someone help you when you read instructions, pamphlets, or other written materials from your doctor or pharmacy?: 1 - Never What is the last grade level you completed in school?: One year college  Interpreter Needed?: No  Information entered by :: Rich Reining, RN  Past Medical History:  Diagnosis Date  . A-fib ([place])   . Abnormal involuntary movements(781.0)   . Anemia   . Anxiety   . Anxiety state, unspecified   . Arthritis    body, neck  . Benign hypertensive heart disease   . Benign hypertensive heart disease with congestive heart failure ([place])   . Bladder disorder    over active  . Cancer ([place])    skin, removed  . Cervicalgia   . CHF (congestive heart failure) ([place])   . Chronic kidney disease, stage III (moderate) (HCC)   . Congestive heart failure, unspecified   . Contact dermatitis and other eczema, due to unspecified cause   . Corns and callosities   . Diarrhea   . Dyspnea 02/18/2015  . Edema   . Esophageal stricture   . Gastroparesis   . Gastroparesis   . GERD (gastroesophageal reflux disease)   . Hiatal hernia   . Hyperlipidemia   .  Inflamed seborrheic keratosis   . Inflammatory disease of breast   . Irregular heartbeat   . Lumbago   . Mixed incontinence urge and stress (female)(female)   . Nausea alone   . Nonspecific (abnormal) findings on radiological and other examination of skull and head   . Osteoarthrosis, unspecified whether generalized or localized, unspecified site   . Other and unspecified hyperlipidemia   . Other B-complex deficiencies   . Other functional disorder of bladder   . Other malaise and fatigue   . Other specified visual disturbances   . Pain in joint,  lower leg   . Palpitations   . Peripheral vascular disease, unspecified ([place])   . Postmenopausal atrophic vaginitis   . Reflux esophagitis   . Seborrheic keratosis   . Sleep apnea   . Spasm of muscle   . Spinal stenosis   . Spinal stenosis, unspecified region other than cervical   . Stricture and stenosis of esophagus   . Stroke ([place])   . TIA (transient ischemic attack) 2012  . Transient ischemic attack (TIA), and cerebral infarction without residual deficits(V12.54)   . Type II or unspecified type diabetes mellitus with renal manifestations, not stated as uncontrolled(250.40)   . Unspecified constipation   . Unspecified essential hypertension   . Unspecified hereditary and idiopathic peripheral neuropathy   . Unspecified hypothyroidism   . Unspecified pruritic disorder   . Unspecified sleep apnea   . Unspecified vitamin D deficiency   . Urinary tract infection, site not specified   . Vitamin B12 deficiency    Past Surgical History:  Procedure Laterality Date  . ABDOMINAL HYSTERECTOMY    . BACK SURGERY     x 2  . CHOLECYSTECTOMY  1991  . esophageal  2004,2006   dilation, Dr Lyla Son  . [place]   bilateral cataract  . hammer toes    . HERNIA REPAIR  1984aaaaaaaa  . SKIN CANCER DESTRUCTION    . THYROIDECTOMY, PARTIAL  1965   Family History  Problem Relation Age of Onset  . Cancer Mother        ? stomach  . Heart disease Father   . Hyperlipidemia Father   . Hypertension Father   . Heart attack Father   . Heart disease Brother   . Kidney disease Daughter   . Heart disease Brother   . Cancer Son    Social History   Socioeconomic History  . Marital status: Widowed    Spouse name: None  . Number of children: 3  . Years of education: None  . Highest education level: None  Social Needs  . Financial resource strain: Not hard at all  . Food insecurity - worry: Never true  . Food insecurity - inability: Never true  . Transportation needs -  medical: No  . Transportation needs - non-medical: No  Occupational History  . Occupation: retired  Tobacco Use  . Smoking status: Never Smoker  . Smokeless tobacco: Never Used  Substance and Sexual Activity  . Alcohol use: No  . Drug use: No  . Sexual activity: No  Other Topics Concern  . None  Social History Narrative  . None    Outpatient Encounter Medications as of 07/11/2017  Medication Sig  . ALPRAZolam (XANAX) 0.5 MG tablet TAKE 1 TABLET BY MOUTH THREE TIMES A DAY  . aspirin 325 MG EC tablet Take 325 mg by mouth daily.  . clotrimazole-betamethasone (LOTRISONE) cream Apply 1 application topically 2 (two)  times daily.  Marland Kitchen CRANBERRY PO Take 300 mg by mouth daily.  . [employer] NATURAL LUTEIN EYE HEALTH PO Take by mouth. One tablet once daily for eyes  . diclofenac sodium (VOLTAREN) 1 % GEL Apply 4 g topically 4 (four) times daily. To bilateral knees  . DIGOX 125 MCG tablet TAKE 1 TABLET BY MOUTH ON MONDAY, WEDNESDAY, FRIDAY, AND SATURDAY  . diphenhydrAMINE-zinc acetate (BENADRYL ITCH STOPPING) cream Apply 1 application topically 3 (three) times daily as needed.  . furosemide (LASIX) 40 MG tablet One daily to control edema  . gabapentin (NEURONTIN) 100 MG capsule Take 2 capsules by mouth at bedtime to help tremors.  Marland Kitchen HYDROcodone-acetaminophen (NORCO/VICODIN) 5-325 MG tablet TAKE 1 TABLET BY MOUTH 4 TIMES A DAY AS NEEDED FOR PAIN.  Marland Kitchen insulin lispro (HUMALOG KWIKPEN) 100 UNIT/ML KiwkPen Inject 10 units before breakfast, 14 units before lunch and 14 units before suppler to control sugar. May use up to 3 more units per per meal as needed per sliding scale max units 45  . Insulin Pen [name] (B-D ULTRAFINE III SHORT PEN) 31G X 8 MM MISC Use with administering insulin. Dx. E11.29  . LANTUS SOLOSTAR 100 UNIT/ML Solostar Pen Inject 42 Units into the skin daily at 10 pm.  . levofloxacin (LEVAQUIN) 250 MG tablet Take 2 tabs po on day 1 then take 1 tab po daily x 6 days  . levothyroxine (SYNTHROID,  LEVOTHROID) 125 MCG tablet TAKE ONE TABLET BY MOUTH EVERY MORNING 30 MINUTES BEFORE BREAKFAST  . metoprolol tartrate (LOPRESSOR) 50 MG tablet TAKE 1 TABLET BY MOUTH TWICE A DAY TO CONTROL HEART RATE.  . Multiple Vitamins-Minerals (PRESERVISION AREDS 2 PO) Take by mouth. 1 by mouth two times daily  . nitroGLYCERIN (NITROSTAT) 0.4 MG SL tablet Place 1 tablet (0.4 mg total) under the tongue every 5 (five) minutes as needed for chest pain.  Marland Kitchen omeprazole (PRILOSEC) 40 MG capsule TAKE ONE (1) CAPSULE BY MOUTH 2 TIMES DAILY FOR STOMACH  . ondansetron (ZOFRAN) 4 MG tablet TAKE ONE TABLET BY MOUTH EVERY 4 HOURS AS NEEDED FOR NAUSEA/VOMITING  . ONE TOUCH ULTRA TEST test strip Use to test sugar three times daily dx E11.29  . Polyethyl Glycol-Propyl Glycol (SYSTANE OP) Apply to eye. One drop both eyes twice daily for dry eyes  . trimethoprim (TRIMPEX) 100 MG tablet TAKE ONE TABLET BY MOUTH EVERY NIGHT AT BEDTIME TO PREVENT BLADDER INFECTION.  Marland Kitchen Vitamin D, Ergocalciferol, (DRISDOL) 50000 units CAPS capsule TAKE 1 CAPSULE BY MOUTH ON THE SAME DAY EVERY WEEK AS DIRECTED.  Marland Kitchen VITAMIN E PO Take 400 Units by mouth daily.  . [DISCONTINUED] fluconazole (DIFLUCAN) 150 MG tablet Take 1 tab po today and repeat dose in 1 week   No facility-administered encounter medications on file as of 07/11/2017.     Activities of Daily Living In your present state of health, do you have any difficulty performing the following activities: 07/11/2017 07/20/2016  Hearing? N N  Vision? N Y  Comment - pt wears glasses  Difficulty concentrating or making decisions? N Y  Comment - Occasionally has remembering issues  Walking or climbing stairs? Y Y  Dressing or bathing? N N  Doing errands, shopping? N N  Comment - Pt still drives vehicle.   Preparing Food and eating ? N N  Using the Toilet? N N  In the past six months, have you accidently leaked urine? Tempie Donning  Comment wears pads Wears pad daily  Do you have problems with loss  of  bowel control? N N  Managing your Medications? N N  Managing your Finances? N Y  Housekeeping or managing your Housekeeping? N N  Some recent data might be hidden    Timed Get Up and Go performed: 40 seconds. Pt is a fall risk  Patient Care Team: Gildardo Cranker, DO as PCP - General (Internal Medicine) Leia Alf, DPM (Inactive) as Attending Physician (Podiatry) Marchia Bond, MD as Consulting Physician (Orthopedic Surgery)    Assessment:     Exercise Activities and Dietary recommendations Current Exercise Habits: The patient does not participate in regular exercise at present, Exercise limited by: orthopedic condition(s)  Goals    . Maintain Lifestyle     Starting today pt will maintain lifestyle.       Fall Risk Fall Risk  07/11/2017 03/27/2017 01/18/2017 12/22/2016 09/20/2016  Falls in the past year? Yes No Yes Yes Yes  Comment - - - - -  Number falls in past yr: 2 or more - 2 or more 1 2 or more  Comment - - - - -  Injury with Fall? Yes - No No Yes  Comment - - - - -  Risk Factor Category  - - - - -  Risk for fall due to : - - - - -  Follow up - - - - -   Is the patient's home free of loose throw rugs in walkways, pet beds, electrical cords, etc?   yes      Grab bars in the bathroom? yes      Handrails on the stairs?   yes      Adequate lighting?   yes  Depression Screen PHQ 2/9 Scores 07/11/2017 03/27/2017 12/22/2016 07/20/2016  PHQ - 2 Score 0 0 0 0  PHQ- 9 Score - - - -     Cognitive Function MMSE - Mini Mental State Exam 07/11/2017 07/20/2016  Orientation to time 5 5  Orientation to Place 5 5  Registration 3 3  Attention/ Calculation 5 4  Recall 2 3  Language- name 2 objects 2 2  Language- repeat 1 1  Language- follow 3 step command 2 3  Language- read & follow direction 1 1  Write a sentence 1 1  Copy design 0 0  Total score 27 28        Immunization History  Administered Date(s) Administered  . Influenza Split 04/14/2010, 04/20/2011,  05/03/2012  . Influenza Whole 04/21/2016  . Influenza, High Dose Seasonal PF 05/04/2017  . Influenza,inj,Quad PF,6+ Mos 05/15/2013, 06/03/2014  . Influenza-Unspecified 05/15/2015  . Pneumococcal Conjugate-13 09/23/2014  . Pneumococcal Polysaccharide-23 01/27/2016  . Tdap 07/20/2013   Screening Tests Health Maintenance  Topic Date Due  . OPHTHALMOLOGY EXAM  09/26/2015  . FOOT EXAM  11/12/2015  . URINE MICROALBUMIN  09/20/2017  . HEMOGLOBIN A1C  09/22/2017  . TETANUS/TDAP  07/21/2023  . INFLUENZA VACCINE  Completed  . DEXA SCAN  Completed  . PNA vac Low Risk Adult  Completed   Cancer Screenings: Lung:  Low Dose CT Chest recommended if Age 60-80 years, 30 pack-year currently smoking OR have quit w/in 15years. Patient does not qualify. Breast:  Up to date on Mammogram? Yes  Up to date of Bone Density/Dexa? Yes Colorectal: up to date  Additional Screenings:  Hepatitis B/HIV/Syphillis: Not indicated Hepatitis C Screening: Not indicated     Plan:    I have personally reviewed and addressed the Medicare Annual Wellness questionnaire and have noted the following  in the patient's chart:  A. Medical and social history B. Use of alcohol, tobacco or illicit drugs  C. Current medications and supplements D. Functional ability and status E.  Nutritional status F.  Physical activity G. Advance directives H. List of other physicians I.  Hospitalizations, surgeries, and ER visits in previous 12 months J.  [place] to include hearing, vision, cognitive, depression L. Referrals and appointments - none  In addition, I have reviewed and discussed with patient certain preventive protocols, quality metrics, and best practice recommendations. A written personalized care plan for preventive services as well as general preventive health recommendations were provided to patient.  See attached scanned questionnaire for additional information.   Signed,   Rich Reining, RN Nurse  Health Advisor   Quick Notes   Health Maintenance: Pt is going to check on insurance to see if she wants the Shingles vaccine.     Abnormal Screen: MMSE 27/30. Did not pass clock drawing     Patient Concerns: Bilateral rashes on knees, arms and thighs. They are itchy and painful, starting a month ago     Nurse Concerns: None

## 2017-07-12 LAB — COMPLETE METABOLIC PANEL WITH GFR
AG Ratio: 1.6 (calc) (ref 1.0–2.5)
ALT: 15 U/L (ref 6–29)
AST: 18 U/L (ref 10–35)
Albumin: 3.9 g/dL (ref 3.6–5.1)
Alkaline phosphatase (APISO): 96 U/L (ref 33–130)
BUN/Creatinine Ratio: 14 (calc) (ref 6–22)
BUN: 16 mg/dL (ref 7–25)
CO2: 29 mmol/L (ref 20–32)
Calcium: 8.6 mg/dL (ref 8.6–10.4)
Chloride: 102 mmol/L (ref 98–110)
Creat: 1.12 mg/dL — ABNORMAL HIGH (ref 0.60–0.88)
GFR, Est African American: 50 mL/min/{1.73_m2} — ABNORMAL LOW (ref >=60)
GFR, Est Non African American: 43 mL/min/{1.73_m2} — ABNORMAL LOW (ref >=60)
Globulin: 2.4 g/dL (calc) (ref 1.9–3.7)
Glucose, Bld: 212 mg/dL — ABNORMAL HIGH (ref 65–99)
Potassium: 5 mmol/L (ref 3.5–5.3)
Sodium: 138 mmol/L (ref 135–146)
Total Bilirubin: 0.4 mg/dL (ref 0.2–1.2)
Total Protein: 6.3 g/dL (ref 6.1–8.1)

## 2017-07-12 LAB — CBC WITH DIFFERENTIAL/PLATELET
Basophils Absolute: 19 cells/uL (ref 0–200)
Basophils Relative: 0.2 %
Eosinophils Absolute: 221 cells/uL (ref 15–500)
Eosinophils Relative: 2.3 %
HCT: 33.2 % — ABNORMAL LOW (ref 35.0–45.0)
Hemoglobin: 11 g/dL — ABNORMAL LOW (ref 11.7–15.5)
Lymphs Abs: 1910 cells/uL (ref 850–3900)
MCH: 30.8 pg (ref 27.0–33.0)
MCHC: 33.1 g/dL (ref 32.0–36.0)
MCV: 93 fL (ref 80.0–100.0)
MPV: 10.4 fL (ref 7.5–12.5)
Monocytes Relative: 8.7 %
Neutro Abs: 6614 cells/uL (ref 1500–7800)
Neutrophils Relative %: 68.9 %
Platelets: 275 10*3/uL (ref 140–400)
RBC: 3.57 10*6/uL — ABNORMAL LOW (ref 3.80–5.10)
RDW: 12.5 % (ref 11.0–15.0)
Total Lymphocyte: 19.9 %
WBC mixed population: 835 cells/uL (ref 200–950)
WBC: 9.6 10*3/uL (ref 3.8–10.8)

## 2017-07-12 LAB — DIGOXIN LEVEL: Digoxin Level: <0.5 mcg/L — ABNORMAL LOW (ref 0.8–2.0)

## 2017-07-12 LAB — HEMOGLOBIN A1C
Hgb A1c MFr Bld: 6.7 % of total Hgb — ABNORMAL HIGH (ref <5.7)
Mean Plasma Glucose: 146 (calc)
eAG (mmol/L): 8.1 (calc)

## 2017-07-12 LAB — TSH: TSH: 3.44 mIU/L (ref 0.40–4.50)

## 2017-07-13 ENCOUNTER — Ambulatory Visit: Payer: Self-pay | Admitting: Internal Medicine

## 2017-07-14 ENCOUNTER — Ambulatory Visit: Payer: Medicare Other | Admitting: Internal Medicine

## 2017-07-14 ENCOUNTER — Encounter: Payer: Self-pay | Admitting: Internal Medicine

## 2017-07-14 VITALS — BP 140/70 | HR 58 | Temp 97.7°F | Wt 143.0 lb

## 2017-07-14 DIAGNOSIS — I480 Paroxysmal atrial fibrillation: Secondary | ICD-10-CM

## 2017-07-14 DIAGNOSIS — E034 Atrophy of thyroid (acquired): Secondary | ICD-10-CM | POA: Diagnosis not present

## 2017-07-14 DIAGNOSIS — I10 Essential (primary) hypertension: Secondary | ICD-10-CM

## 2017-07-14 DIAGNOSIS — N183 Chronic kidney disease, stage 3 unspecified: Secondary | ICD-10-CM

## 2017-07-14 DIAGNOSIS — B372 Candidiasis of skin and nail: Secondary | ICD-10-CM | POA: Insufficient documentation

## 2017-07-14 DIAGNOSIS — M170 Bilateral primary osteoarthritis of knee: Secondary | ICD-10-CM | POA: Diagnosis not present

## 2017-07-14 DIAGNOSIS — E1140 Type 2 diabetes mellitus with diabetic neuropathy, unspecified: Secondary | ICD-10-CM | POA: Diagnosis not present

## 2017-07-14 MED ORDER — FLUCONAZOLE 50 MG PO TABS: ORAL_TABLET | ORAL | 0 refills | Status: DC

## 2017-07-14 NOTE — Patient Instructions (Signed)
START FLUCONAZOLE FOR FUNGAL RASH - take 2 tabs on day 1 then take 1 tab daily x 2 weeks  Continue other medications as ordered  Follow up with specialists as scheduled  Will call with referral dermatology  Follow up in 1 month for rash

## 2017-07-14 NOTE — Progress Notes (Signed)
Patient ID: Karen [name], female   DOB: Oct 02, 1923, 83 y.o.   MRN: [id number]   Location:  [facility] OFFICE  Provider: DR Arletha Grippe   Goals of Care:  Advanced Directives 07/11/2017  Does Patient Have a Medical Advance Directive? Yes  Type of Advance Directive Living will  Does patient want to make changes to medical advance directive? No - Patient declined  Copy of [place] in Chart? -  Would patient like information on creating a medical advance directive? -     Chief Complaint  Patient presents with  . Medical Management of Chronic Issues    44mth follow-up, rash x44mth    HPI: Patient is a 83 y.o. female seen today for medical management of chronic diseases.    She is c/a generalized pruritic angry appearing rash x several weeks but has worsened over the last month and appears to be spreading. No lip swelling. Occasional dysphagia 2/2 acid reflux. No CP/SOB. No relief with topical betamethasone/clotrimazole cream. She would like to see dermatology.  DM - controlled. A1c 6.7%. BS fluctuating (as low as 70 -->200). She takes humalog and lantus. Neuropathy improved on gabapentin  HTN/CHF - BP stable on metoprolol. She takes prn SLNTG. Edema stable on lasix daily. She takes ASA daily  PAF - rate controlled on metoprolol, digoxin and prn cardizem. She takes ASA daily for antiplatelet  Hypothyroidism - stable on levothyroxine. TSH 3.44  Chronic pain syndrome/OA/lumbar spinal stenosis - she has joint stiffness. She uses topical diclofenac and takes norco  Recurrent UTI - stable on maintenance trimethoprim  CKD - stage 3. Cr 1.12  Anemia of chronic disease - stable. Hgb 11  Past Medical History:  Diagnosis Date  . A-fib ([place])   . Abnormal involuntary movements(781.0)   . Anemia   . Anxiety   . Anxiety state, unspecified   . Arthritis    body, neck  . Benign hypertensive heart disease   . Benign hypertensive heart disease with congestive heart  failure ([place])   . Bladder disorder    over active  . Cancer ([place])    skin, removed  . Cervicalgia   . CHF (congestive heart failure) ([place])   . Chronic kidney disease, stage III (moderate) (HCC)   . Congestive heart failure, unspecified   . Contact dermatitis and other eczema, due to unspecified cause   . Corns and callosities   . Diarrhea   . Dyspnea 02/18/2015  . Edema   . Esophageal stricture   . Gastroparesis   . Gastroparesis   . GERD (gastroesophageal reflux disease)   . Hiatal hernia   . Hyperlipidemia   . Inflamed seborrheic keratosis   . Inflammatory disease of breast   . Irregular heartbeat   . Lumbago   . Mixed incontinence urge and stress (female)(female)   . Nausea alone   . Nonspecific (abnormal) findings on radiological and other examination of skull and head   . Osteoarthrosis, unspecified whether generalized or localized, unspecified site   . Other and unspecified hyperlipidemia   . Other B-complex deficiencies   . Other functional disorder of bladder   . Other malaise and fatigue   . Other specified visual disturbances   . Pain in joint, lower leg   . Palpitations   . Peripheral vascular disease, unspecified ([place])   . Postmenopausal atrophic vaginitis   . Reflux esophagitis   . Seborrheic keratosis   . Sleep apnea   . Spasm of muscle   .  Spinal stenosis   . Spinal stenosis, unspecified region other than cervical   . Stricture and stenosis of esophagus   . Stroke ([place])   . TIA (transient ischemic attack) 2012  . Transient ischemic attack (TIA), and cerebral infarction without residual deficits(V12.54)   . Type II or unspecified type diabetes mellitus with renal manifestations, not stated as uncontrolled(250.40)   . Unspecified constipation   . Unspecified essential hypertension   . Unspecified hereditary and idiopathic peripheral neuropathy   . Unspecified hypothyroidism   . Unspecified pruritic disorder   . Unspecified sleep apnea   . Unspecified  vitamin D deficiency   . Urinary tract infection, site not specified   . Vitamin B12 deficiency     Past Surgical History:  Procedure Laterality Date  . ABDOMINAL HYSTERECTOMY    . BACK SURGERY     x 2  . CHOLECYSTECTOMY  1991  . esophageal  2004,2006   dilation, Dr Lyla Son  . [place]   bilateral cataract  . hammer toes    . HERNIA REPAIR  1984aaaaaaaa  . SKIN CANCER DESTRUCTION    . THYROIDECTOMY, PARTIAL  1965     reports that  has never smoked. she has never used smokeless tobacco. She reports that she does not drink alcohol or use drugs. Social History   Socioeconomic History  . Marital status: Widowed    Spouse name: Not on file  . Number of children: 3  . Years of education: Not on file  . Highest education level: Not on file  Social Needs  . Financial resource strain: Not hard at all  . Food insecurity - worry: Never true  . Food insecurity - inability: Never true  . Transportation needs - medical: No  . Transportation needs - non-medical: No  Occupational History  . Occupation: retired  Tobacco Use  . Smoking status: Never Smoker  . Smokeless tobacco: Never Used  Substance and Sexual Activity  . Alcohol use: No  . Drug use: No  . Sexual activity: No  Other Topics Concern  . Not on file  Social History Narrative  . Not on file    Family History  Problem Relation Age of Onset  . Cancer Mother        ? stomach  . Heart disease Father   . Hyperlipidemia Father   . Hypertension Father   . Heart attack Father   . Heart disease Brother   . Kidney disease Daughter   . Heart disease Brother   . Cancer Son     Allergies  Allergen Reactions  . Celebrex [Celecoxib] Rash  . Cymbalta [Duloxetine Hcl] Rash    Lip numbness,rash, leg and body jerks. Patient states " I though I was having a heart attack or stroke."   . Doxycycline Rash  . Lyrica [Pregabalin] Rash  . Avandia [Rosiglitazone]   . Macrodantin [Nitrofurantoin Macrocrystal]    . Sulfa Antibiotics   . Vioxx [Rofecoxib]   . Silicone Rash    Gets rash from the touch it.    Outpatient Encounter Medications as of 07/14/2017  Medication Sig  . ALPRAZolam (XANAX) 0.5 MG tablet TAKE 1 TABLET BY MOUTH THREE TIMES A DAY  . aspirin 325 MG EC tablet Take 325 mg by mouth daily.  Marland Kitchen CRANBERRY PO Take 300 mg by mouth daily.  . [employer] NATURAL LUTEIN EYE HEALTH PO Take by mouth. One tablet once daily for eyes  . diclofenac sodium (VOLTAREN) 1 %  GEL Apply 4 g topically 4 (four) times daily. To bilateral knees  . DIGOX 125 MCG tablet TAKE 1 TABLET BY MOUTH ON MONDAY, WEDNESDAY, FRIDAY, AND SATURDAY  . furosemide (LASIX) 40 MG tablet One daily to control edema  . gabapentin (NEURONTIN) 100 MG capsule Take 2 capsules by mouth at bedtime to help tremors.  Marland Kitchen HYDROcodone-acetaminophen (NORCO/VICODIN) 5-325 MG tablet TAKE 1 TABLET BY MOUTH 4 TIMES A DAY AS NEEDED FOR PAIN.  Marland Kitchen insulin lispro (HUMALOG KWIKPEN) 100 UNIT/ML KiwkPen Inject 10 units before breakfast, 14 units before lunch and 14 units before suppler to control sugar. May use up to 3 more units per per meal as needed per sliding scale max units 45  . Insulin Pen Needle (B-D ULTRAFINE III SHORT PEN) 31G X 8 MM MISC Use with administering insulin. Dx. E11.29  . LANTUS SOLOSTAR 100 UNIT/ML Solostar Pen Inject 42 Units into the skin daily at 10 pm.  . levothyroxine (SYNTHROID, LEVOTHROID) 125 MCG tablet TAKE ONE TABLET BY MOUTH EVERY MORNING 30 MINUTES BEFORE BREAKFAST  . metoprolol tartrate (LOPRESSOR) 50 MG tablet TAKE 1 TABLET BY MOUTH TWICE A DAY TO CONTROL HEART RATE.  . Multiple Vitamins-Minerals (PRESERVISION AREDS 2 PO) Take by mouth. 1 by mouth two times daily  . nitroGLYCERIN (NITROSTAT) 0.4 MG SL tablet Place 1 tablet (0.4 mg total) under the tongue every 5 (five) minutes as needed for chest pain.  Marland Kitchen omeprazole (PRILOSEC) 40 MG capsule TAKE ONE (1) CAPSULE BY MOUTH 2 TIMES DAILY FOR STOMACH  . ondansetron (ZOFRAN) 4 MG  tablet TAKE ONE TABLET BY MOUTH EVERY 4 HOURS AS NEEDED FOR NAUSEA/VOMITING  . ONE TOUCH ULTRA TEST test strip Use to test sugar three times daily dx E11.29  . Polyethyl Glycol-Propyl Glycol (SYSTANE OP) Apply to eye. One drop both eyes twice daily for dry eyes  . trimethoprim (TRIMPEX) 100 MG tablet TAKE ONE TABLET BY MOUTH EVERY NIGHT AT BEDTIME TO PREVENT BLADDER INFECTION.  Marland Kitchen Vitamin D, Ergocalciferol, (DRISDOL) 50000 units CAPS capsule TAKE 1 CAPSULE BY MOUTH ON THE SAME DAY EVERY WEEK AS DIRECTED.  Marland Kitchen VITAMIN E PO Take 400 Units by mouth daily.  . [DISCONTINUED] clotrimazole-betamethasone (LOTRISONE) cream Apply 1 application topically 2 (two) times daily.  . [DISCONTINUED] diphenhydrAMINE-zinc acetate (BENADRYL ITCH STOPPING) cream Apply 1 application topically 3 (three) times daily as needed.  . [DISCONTINUED] levofloxacin (LEVAQUIN) 250 MG tablet Take 2 tabs po on day 1 then take 1 tab po daily x 6 days   No facility-administered encounter medications on file as of 07/14/2017.     Review of Systems:  Review of Systems  Constitutional: Positive for fatigue.  HENT: Positive for trouble swallowing.   Musculoskeletal: Positive for arthralgias, back pain, gait problem and neck stiffness.  Skin: Positive for rash.  All other systems reviewed and are negative.   Health Maintenance  Topic Date Due  . OPHTHALMOLOGY EXAM  09/26/2015  . FOOT EXAM  11/12/2015  . URINE MICROALBUMIN  09/20/2017  . HEMOGLOBIN A1C  01/09/2018  . TETANUS/TDAP  07/21/2023  . INFLUENZA VACCINE  Completed  . DEXA SCAN  Completed  . PNA vac Low Risk Adult  Completed    Physical Exam: Vitals:   07/14/17 1132  BP: 140/70  Pulse: (!) 58  Temp: 97.7 F (36.5 C)  TempSrc: Oral  SpO2: 96%  Weight: 143 lb (64.9 kg)   Body mass index is 23.8 kg/m. Physical Exam  Constitutional: She is oriented to person, place, and time.  She appears well-developed and well-nourished.  HENT:  Mouth/Throat: Oropharynx is  clear and moist. No oropharyngeal exudate.  MMM; no oral thrush  Eyes: Pupils are equal, round, and reactive to light. No scleral icterus.  Neck: Neck supple. Carotid bruit is not present. No tracheal deviation present. No thyromegaly present.  Cardiovascular: Normal rate and intact distal pulses. An irregularly irregular rhythm present.  No extrasystoles are present. Exam reveals no gallop and no friction rub.  Murmur heard.  Systolic murmur is present with a grade of 1/6. Trace  BLE edema b/l. no calf TTP.   Pulmonary/Chest: Effort normal and breath sounds normal. No stridor. No respiratory distress. She has no wheezes. She has no rales.  Abdominal: Soft. Normal appearance and bowel sounds are normal. She exhibits no distension and no mass. There is no hepatomegaly. There is no tenderness. There is no rigidity, no rebound and no guarding. No hernia.  Musculoskeletal: She exhibits edema.       Lumbar back: She exhibits decreased range of motion, tenderness and spasm.       Back:  Lymphadenopathy:    She has no cervical adenopathy.  Neurological: She is alert and oriented to person, place, and time.  Skin: Skin is warm and dry. Rash (generalized angry appearing red rash, blancheable; NT. no vesicular formation. no secondary signs of infection) noted.  Psychiatric: She has a normal mood and affect. Her behavior is normal. Judgment and thought content normal.    Labs reviewed: Basic Metabolic Panel: Recent Labs    09/15/16 0901 12/19/16 0900 03/22/17 0857 07/11/17 1106  NA 138 141 139 138  K 4.8 4.9 5.3 5.0  CL 100 104 101 102  CO2 29 26 26 29   GLUCOSE 94 187* 153* 212*  BUN 36* 26* 28* 16  CREATININE 1.78* 1.43* 1.57* 1.12*  CALCIUM 9.0 8.8 8.7 8.6  TSH 4.27 1.48  --  3.44   Liver Function Tests: Recent Labs    09/15/16 0901 12/19/16 0900 03/22/17 0857 07/11/17 1106  AST 28 17 20 18   ALT 28 18 18 15   ALKPHOS 100 115 118  --   BILITOT 0.4 0.6 0.4 0.4  PROT 6.7 6.5 6.4  6.3  ALBUMIN 3.9 4.0 4.1  --    No results for input(s): LIPASE, AMYLASE in the last 8760 hours. No results for input(s): AMMONIA in the last 8760 hours. CBC: Recent Labs    01/18/17 1026 03/22/17 0857 07/11/17 1106  WBC 10.9* 8.9 9.6  NEUTROABS 7,630 5,251 6,614  HGB 11.2* 11.5* 11.0*  HCT 34.7* 36.1 33.2*  MCV 94.3 93.5 93.0  PLT 242 242 275   Lipid Panel: Recent Labs    09/15/16 0901 03/22/17 0857  CHOL 159 138  HDL 27* 25*  LDLCALC 95 86  TRIG 186* 134  CHOLHDL 5.9* 5.5*   Lab Results  Component Value Date   HGBA1C 6.7 (H) 07/11/2017    Procedures since last visit: No results found.  Assessment/Plan   ICD-10-CM   1. Candidal dermatitis B37.2 Ambulatory referral to Dermatology    fluconazole (DIFLUCAN) 50 MG tablet  2. Controlled type 2 diabetes with neuropathy (HCC) E11.40   3. Hypothyroidism due to acquired atrophy of thyroid E03.4   4. Paroxysmal A-fib (HCC) I48.0   5. Chronic kidney disease, stage III (moderate) (HCC) N18.3   6. Essential hypertension I10   7. Bilateral primary osteoarthritis of knee M17.0    START FLUCONAZOLE FOR FUNGAL RASH - take 2 tabs on day 1  then take 1 tab daily x 2 weeks  Continue other medications as ordered  Follow up with specialists as scheduled  Will call with referral dermatology  Follow up in 1 month for rash    Aniella Wandrey S. Perlie Gold  [facility] [address] [place], [place] [address] [phone] Cell (Monday-Friday 8 AM - 5 PM) [phone] After 5 PM and follow prompts

## 2017-07-21 DIAGNOSIS — L309 Dermatitis, unspecified: Secondary | ICD-10-CM | POA: Diagnosis not present

## 2017-07-24 ENCOUNTER — Other Ambulatory Visit: Payer: Self-pay | Admitting: Internal Medicine

## 2017-07-24 DIAGNOSIS — F419 Anxiety disorder, unspecified: Secondary | ICD-10-CM

## 2017-08-07 ENCOUNTER — Other Ambulatory Visit: Payer: Self-pay | Admitting: Internal Medicine

## 2017-08-07 NOTE — Telephone Encounter (Signed)
A refill request was received for hydrocodone/Apap 5-325 mg tablets. Rx was pended to provider after verifying last fill date, provider, and quantity on PMP [place].

## 2017-08-16 ENCOUNTER — Encounter: Payer: Self-pay | Admitting: Internal Medicine

## 2017-08-16 ENCOUNTER — Ambulatory Visit: Payer: Medicare Other | Admitting: Internal Medicine

## 2017-08-16 VITALS — BP 144/58 | HR 69 | Temp 97.8°F | Ht 65.0 in | Wt 146.0 lb

## 2017-08-16 DIAGNOSIS — B372 Candidiasis of skin and nail: Secondary | ICD-10-CM

## 2017-08-16 DIAGNOSIS — E1140 Type 2 diabetes mellitus with diabetic neuropathy, unspecified: Secondary | ICD-10-CM

## 2017-08-16 DIAGNOSIS — M170 Bilateral primary osteoarthritis of knee: Secondary | ICD-10-CM | POA: Diagnosis not present

## 2017-08-16 NOTE — Progress Notes (Signed)
Patient ID: KERRIANN KAMPHUIS, female   DOB: 09-01-1923, 84 y.o.   MRN: [id number]   Location:  [facility] OFFICE  Provider: DR Arletha Grippe  Code Status:  Goals of Care:  Advanced Directives 07/11/2017  Does Patient Have a Medical Advance Directive? Yes  Type of Advance Directive Living will  Does patient want to make changes to medical advance directive? No - Patient declined  Copy of [place] in Chart? -  Would patient like information on creating a medical advance directive? -     Chief Complaint  Patient presents with  . Follow-up    1 month follow-up on rash, rash improved. Patient seen dermatologist- Dr.Laura Lomax, rx'ed triamcinolone and CeraVe cream   . Medication Refill    No refills needed   . Health Maintenance    DM Foot Exam due, will get eye exam in March (Dr.Digby)     HPI: Patient is a 84 y.o. female seen today for f/u of rash.  She c/o b/l knee pain. She has appt to see Ortho Dr Mardelle Matte after visit here. Her rash has improved on combination CeraVe (moisturizing cream) and Triamcinolone cream. No f/c. She is followed by dermatology  She has DM but it is well controlled. Last A1c 6.7%. No low BS reactions.  Past Medical History:  Diagnosis Date  . A-fib ([employer])   . Abnormal involuntary movements(781.0)   . Anemia   . Anxiety   . Anxiety state, unspecified   . Arthritis    body, neck  . Benign hypertensive heart disease   . Benign hypertensive heart disease with congestive heart failure ([place])   . Bladder disorder    over active  . Cancer ([place])    skin, removed  . Cervicalgia   . CHF (congestive heart failure) ([place])   . Chronic kidney disease, stage III (moderate) (HCC)   . Congestive heart failure, unspecified   . Contact dermatitis and other eczema, due to unspecified cause   . Corns and callosities   . Diarrhea   . Dyspnea 02/18/2015  . Edema   . Esophageal stricture   . Gastroparesis   . Gastroparesis   . GERD  (gastroesophageal reflux disease)   . Hiatal hernia   . Hyperlipidemia   . Inflamed seborrheic keratosis   . Inflammatory disease of breast   . Irregular heartbeat   . Lumbago   . Mixed incontinence urge and stress (female)(female)   . Nausea alone   . Nonspecific (abnormal) findings on radiological and other examination of skull and head   . Osteoarthrosis, unspecified whether generalized or localized, unspecified site   . Other and unspecified hyperlipidemia   . Other B-complex deficiencies   . Other functional disorder of bladder   . Other malaise and fatigue   . Other specified visual disturbances   . Pain in joint, lower leg   . Palpitations   . Peripheral vascular disease, unspecified ([place])   . Postmenopausal atrophic vaginitis   . Reflux esophagitis   . Seborrheic keratosis   . Sleep apnea   . Spasm of muscle   . Spinal stenosis   . Spinal stenosis, unspecified region other than cervical   . Stricture and stenosis of esophagus   . Stroke ([place])   . TIA (transient ischemic attack) 2012  . Transient ischemic attack (TIA), and cerebral infarction without residual deficits(V12.54)   . Type II or unspecified type diabetes mellitus with renal manifestations, not stated as uncontrolled(250.40)   .  Unspecified constipation   . Unspecified essential hypertension   . Unspecified hereditary and idiopathic peripheral neuropathy   . Unspecified hypothyroidism   . Unspecified pruritic disorder   . Unspecified sleep apnea   . Unspecified vitamin D deficiency   . Urinary tract infection, site not specified   . Vitamin B12 deficiency     Past Surgical History:  Procedure Laterality Date  . ABDOMINAL HYSTERECTOMY    . BACK SURGERY     x 2  . CHOLECYSTECTOMY  1991  . esophageal  2004,2006   dilation, Dr Lyla Son  . [place]   bilateral cataract  . hammer toes    . HERNIA REPAIR  1984aaaaaaaa  . SKIN CANCER DESTRUCTION    . THYROIDECTOMY, PARTIAL  1965      reports that  has never smoked. she has never used smokeless tobacco. She reports that she does not drink alcohol or use drugs. Social History   Socioeconomic History  . Marital status: Widowed    Spouse name: Not on file  . Number of children: 3  . Years of education: Not on file  . Highest education level: Not on file  Social Needs  . Financial resource strain: Not hard at all  . Food insecurity - worry: Never true  . Food insecurity - inability: Never true  . Transportation needs - medical: No  . Transportation needs - non-medical: No  Occupational History  . Occupation: retired  Tobacco Use  . Smoking status: Never Smoker  . Smokeless tobacco: Never Used  Substance and Sexual Activity  . Alcohol use: No  . Drug use: No  . Sexual activity: No  Other Topics Concern  . Not on file  Social History Narrative  . Not on file    Family History  Problem Relation Age of Onset  . Cancer Mother        ? stomach  . Heart disease Father   . Hyperlipidemia Father   . Hypertension Father   . Heart attack Father   . Heart disease Brother   . Kidney disease Daughter   . Heart disease Brother   . Cancer Son     Allergies  Allergen Reactions  . Celebrex [Celecoxib] Rash  . Cymbalta [Duloxetine Hcl] Rash    Lip numbness,rash, leg and body jerks. Patient states " I though I was having a heart attack or stroke."   . Doxycycline Rash  . Lyrica [Pregabalin] Rash  . Avandia [Rosiglitazone]   . Macrodantin [Nitrofurantoin Macrocrystal]   . Sulfa Antibiotics   . Vioxx [Rofecoxib]   . Silicone Rash    Gets rash from the touch it.    Outpatient Encounter Medications as of 08/16/2017  Medication Sig  . ALPRAZolam (XANAX) 0.5 MG tablet TAKE 1 TABLET BY MOUTH THREE TIMES A DAY  . aspirin 325 MG EC tablet Take 325 mg by mouth daily.  Marland Kitchen CRANBERRY PO Take 300 mg by mouth daily.  . [facility] NATURAL LUTEIN EYE HEALTH PO Take by mouth. One tablet once daily for eyes  . diclofenac sodium  (VOLTAREN) 1 % GEL Apply 4 g topically 4 (four) times daily. To bilateral knees  . DIGOX 125 MCG tablet TAKE 1 TABLET BY MOUTH ON MONDAY, WEDNESDAY, FRIDAY, AND SATURDAY  . furosemide (LASIX) 40 MG tablet One daily to control edema  . gabapentin (NEURONTIN) 100 MG capsule Take 2 capsules by mouth at bedtime to help tremors.  Marland Kitchen HYDROcodone-acetaminophen (NORCO/VICODIN) 5-325 MG tablet  TAKE 1 TABLET BY MOUTH 4 TIMES A DAY AS NEEDED FOR PAIN.  Marland Kitchen insulin lispro (HUMALOG KWIKPEN) 100 UNIT/ML KiwkPen Inject 10 units before breakfast, 14 units before lunch and 14 units before suppler to control sugar. May use up to 3 more units per per meal as needed per sliding scale max units 45  . Insulin Pen Needle (B-D ULTRAFINE III SHORT PEN) 31G X 8 MM MISC Use with administering insulin. Dx. E11.29  . LANTUS SOLOSTAR 100 UNIT/ML Solostar Pen Inject 42 Units into the skin daily at 10 pm.  . levothyroxine (SYNTHROID, LEVOTHROID) 125 MCG tablet TAKE ONE TABLET BY MOUTH EVERY MORNING 30 MINUTES BEFORE BREAKFAST  . metoprolol tartrate (LOPRESSOR) 50 MG tablet TAKE 1 TABLET BY MOUTH TWICE A DAY TO CONTROL HEART RATE.  . Multiple Vitamins-Minerals (PRESERVISION AREDS 2 PO) Take by mouth. 1 by mouth two times daily  . nitroGLYCERIN (NITROSTAT) 0.4 MG SL tablet Place 1 tablet (0.4 mg total) under the tongue every 5 (five) minutes as needed for chest pain.  Marland Kitchen omeprazole (PRILOSEC) 40 MG capsule TAKE ONE (1) CAPSULE BY MOUTH 2 TIMES DAILY FOR STOMACH  . ondansetron (ZOFRAN) 4 MG tablet TAKE ONE TABLET BY MOUTH EVERY 4 HOURS AS NEEDED FOR NAUSEA/VOMITING  . ONE TOUCH ULTRA TEST test strip Use to test sugar three times daily dx E11.29  . Polyethyl Glycol-Propyl Glycol (SYSTANE OP) Apply to eye. One drop both eyes twice daily for dry eyes  . triamcinolone cream (KENALOG) 0.1 % Mix with CeraVe cream apply twice daily as directed  . trimethoprim (TRIMPEX) 100 MG tablet TAKE ONE TABLET BY MOUTH EVERY NIGHT AT BEDTIME TO PREVENT  BLADDER INFECTION.  Marland Kitchen Vitamin D, Ergocalciferol, (DRISDOL) 50000 units CAPS capsule TAKE 1 CAPSULE BY MOUTH ON THE SAME DAY EVERY WEEK AS DIRECTED.  Marland Kitchen VITAMIN E PO Take 400 Units by mouth daily.  . [DISCONTINUED] fluconazole (DIFLUCAN) 50 MG tablet Take 2 tabs po on day 1 then take 1 tab po daily x 13 days for fungal rash   No facility-administered encounter medications on file as of 08/16/2017.     Review of Systems:  Review of Systems  Musculoskeletal: Positive for arthralgias, gait problem and joint swelling.  Skin: Positive for rash.  Neurological: Positive for numbness.  All other systems reviewed and are negative.   Health Maintenance  Topic Date Due  . FOOT EXAM  11/12/2015  . OPHTHALMOLOGY EXAM  10/29/2017 (Originally 09/26/2015)  . URINE MICROALBUMIN  09/20/2017  . HEMOGLOBIN A1C  01/09/2018  . TETANUS/TDAP  07/21/2023  . INFLUENZA VACCINE  Completed  . DEXA SCAN  Completed  . PNA vac Low Risk Adult  Completed    Physical Exam: Vitals:   08/16/17 0924  BP: (!) 144/58  Pulse: 69  Temp: 97.8 F (36.6 C)  TempSrc: Oral  SpO2: 93%  Weight: 146 lb (66.2 kg)  Height: 5\' 5"  (1.651 m)   Body mass index is 24.3 kg/m. Physical Exam  Constitutional: She is oriented to person, place, and time. She appears well-developed and well-nourished.  Looks well in NAD  Cardiovascular:  Chronic venous stasis changes b/l LE with +1 pitting LE edema. No calf TTP  Musculoskeletal: She exhibits edema (b/l knee with reduced ROM), tenderness (medial left posterior knee) and deformity (small and large joints).  Neurological: She is alert and oriented to person, place, and time.  Skin: Skin is warm and dry. Rash (drying up - small patches noted left anterior knee) noted.  Psychiatric:  She has a normal mood and affect. Her behavior is normal. Judgment and thought content normal.   Diabetic Foot Exam - Simple   Simple Foot Form Diabetic Foot exam was performed with the following findings:   Yes 08/16/2017  3:21 PM  Visual Inspection See comments:  Yes Sensation Testing See comments:  Yes Pulse Check Posterior Tibialis and Dorsalis pulse intact bilaterally:  Yes Comments B/l hammer toes and bunions; no calluses or ulcerations; reduced monofilament testing b/l      Labs reviewed: Basic Metabolic Panel: Recent Labs    09/15/16 0901 12/19/16 0900 03/22/17 0857 07/11/17 1106  NA 138 141 139 138  K 4.8 4.9 5.3 5.0  CL 100 104 101 102  CO2 29 26 26 29   GLUCOSE 94 187* 153* 212*  BUN 36* 26* 28* 16  CREATININE 1.78* 1.43* 1.57* 1.12*  CALCIUM 9.0 8.8 8.7 8.6  TSH 4.27 1.48  --  3.44   Liver Function Tests: Recent Labs    09/15/16 0901 12/19/16 0900 03/22/17 0857 07/11/17 1106  AST 28 17 20 18   ALT 28 18 18 15   ALKPHOS 100 115 118  --   BILITOT 0.4 0.6 0.4 0.4  PROT 6.7 6.5 6.4 6.3  ALBUMIN 3.9 4.0 4.1  --    No results for input(s): LIPASE, AMYLASE in the last 8760 hours. No results for input(s): AMMONIA in the last 8760 hours. CBC: Recent Labs    01/18/17 1026 03/22/17 0857 07/11/17 1106  WBC 10.9* 8.9 9.6  NEUTROABS 7,630 5,251 6,614  HGB 11.2* 11.5* 11.0*  HCT 34.7* 36.1 33.2*  MCV 94.3 93.5 93.0  PLT 242 242 275   Lipid Panel: Recent Labs    09/15/16 0901 03/22/17 0857  CHOL 159 138  HDL 27* 25*  LDLCALC 95 86  TRIG 186* 134  CHOLHDL 5.9* 5.5*   Lab Results  Component Value Date   HGBA1C 6.7 (H) 07/11/2017    Procedures since last visit: No results found.  Assessment/Plan   ICD-10-CM   1. Candidal dermatitis - much improved B37.2   2. Controlled type 2 diabetes with neuropathy (HCC) E11.40     Continue current medications as ordered  Follow up with specialists as scheduled  Follow up in 3 mos for DM, CHF, chronic pain, hyperlipidemia. Will check fasting labs at appt (bmp, alt, lipid panel, a1c)     Eria Lozoya S. Perlie Gold  [facility] [address] [place],  [place] [address] [phone] Cell (Monday-Friday 8 AM - 5 PM) [phone] After 5 PM and follow prompts

## 2017-08-16 NOTE — Patient Instructions (Addendum)
Continue current medications as ordered  Follow up with specialists as scheduled  Follow up in 3 mos for DM, CHF, chronic pain, hyperlipidemia.

## 2017-08-23 ENCOUNTER — Other Ambulatory Visit: Payer: Self-pay | Admitting: Internal Medicine

## 2017-08-23 DIAGNOSIS — F419 Anxiety disorder, unspecified: Secondary | ICD-10-CM

## 2017-08-24 ENCOUNTER — Other Ambulatory Visit: Payer: Self-pay | Admitting: Internal Medicine

## 2017-08-24 DIAGNOSIS — F419 Anxiety disorder, unspecified: Secondary | ICD-10-CM

## 2017-08-24 NOTE — Telephone Encounter (Signed)
Patient requested stated that [facility] is closing at 6:00 today forever and she wanted to get her Rx one last time before they closed.  [facility] bought out.

## 2017-08-31 DIAGNOSIS — L308 Other specified dermatitis: Secondary | ICD-10-CM | POA: Diagnosis not present

## 2017-08-31 DIAGNOSIS — L853 Xerosis cutis: Secondary | ICD-10-CM | POA: Diagnosis not present

## 2017-08-31 DIAGNOSIS — D692 Other nonthrombocytopenic purpura: Secondary | ICD-10-CM | POA: Diagnosis not present

## 2017-08-31 DIAGNOSIS — L814 Other melanin hyperpigmentation: Secondary | ICD-10-CM | POA: Diagnosis not present

## 2017-09-07 ENCOUNTER — Other Ambulatory Visit: Payer: Self-pay | Admitting: Internal Medicine

## 2017-09-07 ENCOUNTER — Other Ambulatory Visit: Payer: Self-pay | Admitting: *Deleted

## 2017-09-07 MED ORDER — HYDROCODONE-ACETAMINOPHEN 5-325 MG PO TABS: ORAL_TABLET | ORAL | 0 refills | Status: DC

## 2017-09-07 NOTE — Telephone Encounter (Signed)
Patient requested  [place] Confirmed Pended Rx and sent to Dr. Eulas Post for approval.

## 2017-09-14 ENCOUNTER — Telehealth: Payer: Self-pay

## 2017-09-14 NOTE — Telephone Encounter (Signed)
Incoming fax received from [facility] orders for Right/left Elbow Orthosis.  I called patient to confirm that she requested this equipment. Patient denies requesting this equipment.  I sent fax back with the indication patient is not interested and did not request, please remove from soliciting list.

## 2017-09-25 ENCOUNTER — Other Ambulatory Visit: Payer: Self-pay | Admitting: Internal Medicine

## 2017-09-25 DIAGNOSIS — F419 Anxiety disorder, unspecified: Secondary | ICD-10-CM

## 2017-09-25 DIAGNOSIS — E039 Hypothyroidism, unspecified: Secondary | ICD-10-CM

## 2017-09-25 NOTE — Telephone Encounter (Signed)
A medication refill was received from pharmacy for alprazolam  0.5 mg. Rx was called in to pharmacy after verifying last fill date, provider, and quantity on PMP [place] database.   

## 2017-09-29 DIAGNOSIS — M2352 Chronic instability of knee, left knee: Secondary | ICD-10-CM | POA: Diagnosis not present

## 2017-09-29 DIAGNOSIS — M4692 Unspecified inflammatory spondylopathy, cervical region: Secondary | ICD-10-CM | POA: Diagnosis not present

## 2017-09-29 DIAGNOSIS — M1712 Unilateral primary osteoarthritis, left knee: Secondary | ICD-10-CM | POA: Diagnosis not present

## 2017-09-29 DIAGNOSIS — M25522 Pain in left elbow: Secondary | ICD-10-CM | POA: Diagnosis not present

## 2017-09-29 DIAGNOSIS — M25521 Pain in right elbow: Secondary | ICD-10-CM | POA: Diagnosis not present

## 2017-10-04 ENCOUNTER — Other Ambulatory Visit: Payer: Self-pay | Admitting: Internal Medicine

## 2017-10-09 ENCOUNTER — Other Ambulatory Visit: Payer: Self-pay | Admitting: *Deleted

## 2017-10-09 MED ORDER — HYDROCODONE-ACETAMINOPHEN 5-325 MG PO TABS: ORAL_TABLET | ORAL | 0 refills | Status: DC

## 2017-10-09 NOTE — Telephone Encounter (Signed)
Patient requested refill [place] Verified LR 09/07/17 Pharmacy Confirmed Pended Rx and sent to Dr. Eulas Post for approval.

## 2017-10-12 ENCOUNTER — Telehealth: Payer: Self-pay | Admitting: *Deleted

## 2017-10-12 NOTE — Telephone Encounter (Signed)
Received prior Authorization Request for Diclofenac Sodium 1% Gel. Initiated Prior authorization through [facility]. Went into determination. 48-72 hour turn around. Awaiting determination Key:[id number] PA Case [id number]

## 2017-10-13 NOTE — Telephone Encounter (Signed)
Received fax from [place] and Diclofenac Gel is APPROVED through 07/31/18

## 2017-10-23 ENCOUNTER — Other Ambulatory Visit: Payer: Self-pay | Admitting: Internal Medicine

## 2017-10-23 DIAGNOSIS — F419 Anxiety disorder, unspecified: Secondary | ICD-10-CM

## 2017-10-23 NOTE — Telephone Encounter (Signed)
A medication refill was received from pharmacy for alprazolam  0.5 mg. Rx was called in to pharmacy after verifying last fill date, provider, and quantity on PMP [place] database.   

## 2017-11-10 ENCOUNTER — Other Ambulatory Visit: Payer: Self-pay | Admitting: *Deleted

## 2017-11-10 MED ORDER — HYDROCODONE-ACETAMINOPHEN 5-325 MG PO TABS: ORAL_TABLET | ORAL | 0 refills | Status: DC

## 2017-11-10 NOTE — Telephone Encounter (Signed)
Last filled 10/09/2017, Database checked and verified

## 2017-11-15 DIAGNOSIS — M170 Bilateral primary osteoarthritis of knee: Secondary | ICD-10-CM | POA: Diagnosis not present

## 2017-11-20 ENCOUNTER — Other Ambulatory Visit: Payer: Self-pay | Admitting: Internal Medicine

## 2017-11-20 DIAGNOSIS — F419 Anxiety disorder, unspecified: Secondary | ICD-10-CM

## 2017-11-21 ENCOUNTER — Ambulatory Visit (INDEPENDENT_AMBULATORY_CARE_PROVIDER_SITE_OTHER): Payer: Medicare Other | Admitting: Internal Medicine

## 2017-11-21 ENCOUNTER — Encounter: Payer: Self-pay | Admitting: Internal Medicine

## 2017-11-21 VITALS — BP 124/70 | HR 56 | Temp 97.8°F | Ht 65.0 in | Wt 144.6 lb

## 2017-11-21 DIAGNOSIS — K219 Gastro-esophageal reflux disease without esophagitis: Secondary | ICD-10-CM | POA: Diagnosis not present

## 2017-11-21 DIAGNOSIS — M170 Bilateral primary osteoarthritis of knee: Secondary | ICD-10-CM

## 2017-11-21 DIAGNOSIS — E034 Atrophy of thyroid (acquired): Secondary | ICD-10-CM

## 2017-11-21 DIAGNOSIS — F419 Anxiety disorder, unspecified: Secondary | ICD-10-CM

## 2017-11-21 DIAGNOSIS — Z79899 Other long term (current) drug therapy: Secondary | ICD-10-CM

## 2017-11-21 DIAGNOSIS — I480 Paroxysmal atrial fibrillation: Secondary | ICD-10-CM

## 2017-11-21 DIAGNOSIS — E1140 Type 2 diabetes mellitus with diabetic neuropathy, unspecified: Secondary | ICD-10-CM

## 2017-11-21 LAB — CBC WITH DIFFERENTIAL/PLATELET
BASOS ABS: 21 {cells}/uL (ref 0–200)
Basophils Relative: 0.2 %
EOS ABS: 200 {cells}/uL (ref 15–500)
Eosinophils Relative: 1.9 %
HCT: 35 % (ref 35.0–45.0)
HEMOGLOBIN: 11.9 g/dL (ref 11.7–15.5)
Lymphs Abs: 2520 cells/uL (ref 850–3900)
MCH: 31.1 pg (ref 27.0–33.0)
MCHC: 34 g/dL (ref 32.0–36.0)
MCV: 91.4 fL (ref 80.0–100.0)
MONOS PCT: 8.7 %
MPV: 11.4 fL (ref 7.5–12.5)
Neutro Abs: 6846 cells/uL (ref 1500–7800)
Neutrophils Relative %: 65.2 %
Platelets: 233 10*3/uL (ref 140–400)
RBC: 3.83 10*6/uL (ref 3.80–5.10)
RDW: 13 % (ref 11.0–15.0)
Total Lymphocyte: 24 %
WBC mixed population: 914 cells/uL (ref 200–950)
WBC: 10.5 10*3/uL (ref 3.8–10.8)

## 2017-11-21 LAB — LIPID PANEL
CHOL/HDL RATIO: 5.6 (calc) — AB (ref <5.0)
CHOLESTEROL: 152 mg/dL (ref <200)
HDL: 27 mg/dL — ABNORMAL LOW (ref >50)
LDL Cholesterol (Calc): 88 mg/dL (calc)
NON-HDL CHOLESTEROL (CALC): 125 mg/dL (ref <130)
Triglycerides: 307 mg/dL — ABNORMAL HIGH (ref <150)

## 2017-11-21 LAB — COMPLETE METABOLIC PANEL WITH GFR
AG Ratio: 1.7 (calc) (ref 1.0–2.5)
ALT: 28 U/L (ref 6–29)
AST: 26 U/L (ref 10–35)
Albumin: 4 g/dL (ref 3.6–5.1)
Alkaline phosphatase (APISO): 120 U/L (ref 33–130)
BUN/Creatinine Ratio: 18 (calc) (ref 6–22)
BUN: 32 mg/dL — ABNORMAL HIGH (ref 7–25)
CALCIUM: 8.6 mg/dL (ref 8.6–10.4)
CO2: 30 mmol/L (ref 20–32)
Chloride: 106 mmol/L (ref 98–110)
Creat: 1.74 mg/dL — ABNORMAL HIGH (ref 0.60–0.88)
GFR, EST AFRICAN AMERICAN: 29 mL/min/{1.73_m2} — AB (ref >=60)
GFR, EST NON AFRICAN AMERICAN: 25 mL/min/{1.73_m2} — AB (ref >=60)
GLOBULIN: 2.4 g/dL (ref 1.9–3.7)
Glucose, Bld: 78 mg/dL (ref 65–139)
Potassium: 4.7 mmol/L (ref 3.5–5.3)
SODIUM: 142 mmol/L (ref 135–146)
TOTAL PROTEIN: 6.4 g/dL (ref 6.1–8.1)
Total Bilirubin: 0.4 mg/dL (ref 0.2–1.2)

## 2017-11-21 LAB — TSH: TSH: 2.67 m[IU]/L (ref 0.40–4.50)

## 2017-11-21 LAB — T4, FREE: FREE T4: 1.2 ng/dL (ref 0.8–1.8)

## 2017-11-21 MED ORDER — OMEPRAZOLE 40 MG PO CPDR: DELAYED_RELEASE_CAPSULE | ORAL | 1 refills | Status: DC

## 2017-11-21 MED ORDER — ALPRAZOLAM 0.5 MG PO TABS: 0.5000 mg | ORAL_TABLET | Freq: Three times a day (TID) | ORAL | 0 refills | Status: DC | PRN

## 2017-11-21 NOTE — Progress Notes (Signed)
Patient ID: Karen FREESE, female   DOB: [birth year], 84 y.o.   MRN: [id number]   Location:  [facility] OFFICE  Provider: DR Arletha Grippe  Code Status:  Goals of Care:  Advanced Directives 07/11/2017  Does Patient Have a Medical Advance Directive? Yes  Type of Advance Directive Living will  Does patient want to make changes to medical advance directive? No - Patient declined  Copy of [place] in Chart? -  Would patient like information on creating a medical advance directive? -     Chief Complaint  Patient presents with  . Medical Management of Chronic Issues    3 months; Pt c/o of swelling L leg had cortisone shots last Wed but pain is same before Cortisone shot was given   . Medication Refill    Prilosec and Xanax ([place] datatbase confirmed)     HPI: Patient is a 84 y.o. female seen today for medical management of chronic diseases.  She c/o pain in legs. She rec'd cortisol injection last week that is ineffective overall. She has a baker's cyst. Norco does help with pain. Xanax helps anxiety.  DM - controlled. A1c 6.7%. BS fluctuating (as low as 70 -->200). She takes humalog and lantus. Neuropathy improved on gabapentin  HTN/CHF - BP stable on metoprolol. She takes prn SLNTG. Edema stable on lasix daily. She takes ASA daily  PAF - rate controlled on metoprolol, digoxin and prn cardizem. She takes ASA daily for antiplatelet  Hypothyroidism - stable on levothyroxine. TSH 3.44  Chronic pain syndrome/OA/lumbar spinal stenosis - she has joint stiffness. She uses topical diclofenac and takes norco  Recurrent UTI - stable on maintenance trimethoprim  CKD - stage 3. Cr 1.12  Anemia of chronic disease - stable. Hgb 11     Past Medical History:  Diagnosis Date  . A-fib ([place])   . Abnormal involuntary movements(781.0)   . Anemia   . Anxiety   . Anxiety state, unspecified   . Arthritis    body, neck  . Benign hypertensive heart disease   . Benign  hypertensive heart disease with congestive heart failure ([place])   . Bladder disorder    over active  . Cancer ([place])    skin, removed  . Cervicalgia   . CHF (congestive heart failure) ([place])   . Chronic kidney disease, stage III (moderate) (HCC)   . Congestive heart failure, unspecified   . Contact dermatitis and other eczema, due to unspecified cause   . Corns and callosities   . Diarrhea   . Dyspnea 02/18/2015  . Edema   . Esophageal stricture   . Gastroparesis   . Gastroparesis   . GERD (gastroesophageal reflux disease)   . Hiatal hernia   . Hyperlipidemia   . Inflamed seborrheic keratosis   . Inflammatory disease of breast   . Irregular heartbeat   . Lumbago   . Mixed incontinence urge and stress (female)(female)   . Nausea alone   . Nonspecific (abnormal) findings on radiological and other examination of skull and head   . Osteoarthrosis, unspecified whether generalized or localized, unspecified site   . Other and unspecified hyperlipidemia   . Other B-complex deficiencies   . Other functional disorder of bladder   . Other malaise and fatigue   . Other specified visual disturbances   . Pain in joint, lower leg   . Palpitations   . Peripheral vascular disease, unspecified ([place])   . Postmenopausal atrophic vaginitis   . Reflux  esophagitis   . Seborrheic keratosis   . Sleep apnea   . Spasm of muscle   . Spinal stenosis   . Spinal stenosis, unspecified region other than cervical   . Stricture and stenosis of esophagus   . Stroke ([place])   . TIA (transient ischemic attack) 2012  . Transient ischemic attack (TIA), and cerebral infarction without residual deficits(V12.54)   . Type II or unspecified type diabetes mellitus with renal manifestations, not stated as uncontrolled(250.40)   . Unspecified constipation   . Unspecified essential hypertension   . Unspecified hereditary and idiopathic peripheral neuropathy   . Unspecified hypothyroidism   . Unspecified pruritic disorder    . Unspecified sleep apnea   . Unspecified vitamin D deficiency   . Urinary tract infection, site not specified   . Vitamin B12 deficiency     Past Surgical History:  Procedure Laterality Date  . ABDOMINAL HYSTERECTOMY    . BACK SURGERY     x 2  . CHOLECYSTECTOMY  1991  . esophageal  2004,2006   dilation, Dr Lyla Son  . [place]   bilateral cataract  . hammer toes    . HERNIA REPAIR  1984aaaaaaaa  . SKIN CANCER DESTRUCTION    . THYROIDECTOMY, PARTIAL  1965     reports that she has never smoked. She has never used smokeless tobacco. She reports that she does not drink alcohol or use drugs. Social History   Socioeconomic History  . Marital status: Widowed    Spouse name: Not on file  . Number of children: 3  . Years of education: Not on file  . Highest education level: Not on file  Occupational History  . Occupation: retired  Scientific laboratory technician  . Financial resource strain: Not hard at all  . Food insecurity:    Worry: Never true    Inability: Never true  . Transportation needs:    Medical: No    Non-medical: No  Tobacco Use  . Smoking status: Never Smoker  . Smokeless tobacco: Never Used  Substance and Sexual Activity  . Alcohol use: No  . Drug use: No  . Sexual activity: Never  Lifestyle  . Physical activity:    Days per week: 0 days    Minutes per session: 0 min  . Stress: Rather much  Relationships  . Social connections:    Talks on phone: More than three times a week    Gets together: More than three times a week    Attends religious service: More than 4 times per year    Active member of club or organization: Yes    Attends meetings of clubs or organizations: Never    Relationship status: Widowed  . Intimate partner violence:    Fear of current or ex partner: No    Emotionally abused: No    Physically abused: No    Forced sexual activity: No  Other Topics Concern  . Not on file  Social History Narrative  . Not on file    Family  History  Problem Relation Age of Onset  . Cancer Mother        ? stomach  . Heart disease Father   . Hyperlipidemia Father   . Hypertension Father   . Heart attack Father   . Heart disease Brother   . Kidney disease Daughter   . Heart disease Brother   . Cancer Son     Allergies  Allergen Reactions  . Celebrex [Celecoxib]  Rash  . Cymbalta [Duloxetine Hcl] Rash    Lip numbness,rash, leg and body jerks. Patient states " I though I was having a heart attack or stroke."   . Doxycycline Rash  . Lyrica [Pregabalin] Rash  . Avandia [Rosiglitazone]   . Macrodantin [Nitrofurantoin Macrocrystal]   . Sulfa Antibiotics   . Vioxx [Rofecoxib]   . Silicone Rash    Gets rash from the touch it.    Outpatient Encounter Medications as of 11/21/2017  Medication Sig  . ALPRAZolam (XANAX) 0.5 MG tablet TAKE 1 TABLET BY MOUTH 3 TIMES DAILY AS NEEDED  . aspirin 325 MG EC tablet Take 325 mg by mouth daily.  Marland Kitchen CRANBERRY PO Take 300 mg by mouth daily.  . [facility] NATURAL LUTEIN EYE HEALTH PO Take by mouth. One tablet once daily for eyes  . diclofenac sodium (VOLTAREN) 1 % GEL Apply 4 g topically 4 (four) times daily. To bilateral knees  . DIGOX 125 MCG tablet TAKE 1 TABLET BY MOUTH ON MONDAY, WEDNESDAY, FRIDAY, AND SATURDAY  . furosemide (LASIX) 40 MG tablet One daily to control edema  . gabapentin (NEURONTIN) 100 MG capsule Take 2 capsules by mouth at bedtime to help tremors.  Marland Kitchen HYDROcodone-acetaminophen (NORCO/VICODIN) 5-325 MG tablet Take one tablet by mouth four times daily as needed for pain  . insulin lispro (HUMALOG KWIKPEN) 100 UNIT/ML KiwkPen Inject 10 units before breakfast, 14 units before lunch and 14 units before suppler to control sugar. May use up to 3 more units per per meal as needed per sliding scale max units 45  . Insulin Pen Needle (B-D ULTRAFINE III SHORT PEN) 31G X 8 MM MISC Use with administering insulin. Dx. E11.29  . LANTUS SOLOSTAR 100 UNIT/ML Solostar Pen INJECT 42 UNITS INTO THE  SKIN AT BEDTIME  . levothyroxine (SYNTHROID, LEVOTHROID) 125 MCG tablet TAKE ONE TABLET BY MOUTH EVERY MORNING 30 MINUTES BEFORE BREAKFAST  . metoprolol tartrate (LOPRESSOR) 50 MG tablet TAKE 1 TABLET BY MOUTH TWICE A DAY TO CONTROL HEART RATE.  . Multiple Vitamins-Minerals (PRESERVISION AREDS 2 PO) Take by mouth. 1 by mouth two times daily  . nitroGLYCERIN (NITROSTAT) 0.4 MG SL tablet Place 1 tablet (0.4 mg total) under the tongue every 5 (five) minutes as needed for chest pain.  Marland Kitchen omeprazole (PRILOSEC) 40 MG capsule TAKE ONE (1) CAPSULE BY MOUTH 2 TIMES DAILY FOR STOMACH  . ondansetron (ZOFRAN) 4 MG tablet TAKE ONE TABLET BY MOUTH EVERY 4 HOURS AS NEEDED FOR NAUSEA/VOMITING  . ONE TOUCH ULTRA TEST test strip Use to test sugar three times daily dx E11.29  . Polyethyl Glycol-Propyl Glycol (SYSTANE OP) Apply to eye. One drop both eyes twice daily for dry eyes  . triamcinolone cream (KENALOG) 0.1 % Mix with CeraVe cream apply twice daily as directed  . trimethoprim (TRIMPEX) 100 MG tablet TAKE ONE TABLET BY MOUTH EVERY NIGHT AT BEDTIME TO PREVENT BLADDER INFECTION.  Marland Kitchen Vitamin D, Ergocalciferol, (DRISDOL) 50000 units CAPS capsule TAKE 1 CAPSULE BY MOUTH ON THE SAME DAY EVERY WEEK AS DIRECTED.  Marland Kitchen VITAMIN E PO Take 400 Units by mouth daily.   No facility-administered encounter medications on file as of 11/21/2017.     Review of Systems:  Review of Systems  Health Maintenance  Topic Date Due  . OPHTHALMOLOGY EXAM  09/26/2015  . URINE MICROALBUMIN  09/20/2017  . HEMOGLOBIN A1C  01/09/2018  . INFLUENZA VACCINE  03/01/2018  . FOOT EXAM  08/16/2018  . TETANUS/TDAP  07/21/2023  . DEXA  SCAN  Completed  . PNA vac Low Risk Adult  Completed    Physical Exam: Vitals:   11/21/17 1354  BP: 124/70  Pulse: (!) 56  Temp: 97.8 F (36.6 C)  TempSrc: Oral  SpO2: 92%  Weight: 144 lb 9.6 oz (65.6 kg)  Height: _0  (1.651 m)   Body mass index is 24.06 kg/m. Physical Exam  Constitutional: She is  oriented to person, place, and time. She appears well-developed and well-nourished.  HENT:  Mouth/Throat: Oropharynx is clear and moist. No oropharyngeal exudate.  Eyes: Pupils are equal, round, and reactive to light. No scleral icterus.  Neck: Neck supple. Muscular tenderness present. No spinous process tenderness present. Carotid bruit is not present. Decreased range of motion present. No tracheal deviation present. No thyromegaly present.    Cardiovascular: Normal rate and intact distal pulses. An irregularly irregular rhythm present. Exam reveals no gallop and no friction rub.  Murmur (1/6 SEM) heard. +1 pitting LE edema b/l; no calf TTP; chronic venous stasis changes  Pulmonary/Chest: Effort normal and breath sounds normal. No stridor. No respiratory distress. She has no wheezes. She has no rales.  Abdominal: Soft. Bowel sounds are normal. She exhibits no distension and no mass. There is no hepatomegaly. There is no tenderness. There is no rebound and no guarding.  Musculoskeletal: She exhibits edema and tenderness.       Lumbar back: She exhibits decreased range of motion, tenderness, pain and spasm.       Back:  Lymphadenopathy:    She has no cervical adenopathy.  Neurological: She is alert and oriented to person, place, and time. She has normal reflexes.  Skin: Skin is warm and dry. No rash noted.  Psychiatric: She has a normal mood and affect. Her behavior is normal. Judgment and thought content normal.    Labs reviewed: Basic Metabolic Panel: Recent Labs    12/19/16 0900 03/22/17 0857 07/11/17 1106  NA 141 139 138  K 4.9 5.3 5.0  CL 104 101 102  CO2 _1 GLUCOSE 187* 153* 212*  BUN 26* 28* 16  CREATININE 1.43* 1.57* 1.12*  CALCIUM 8.8 8.7 8.6  TSH 1.48  --  3.44   Liver Function Tests: Recent Labs    12/19/16 0900 03/22/17 0857 07/11/17 1106  AST _2 ALT _3 ALKPHOS 115 118  --   BILITOT 0.6 0.4 0.4  PROT 6.5 6.4 6.3  ALBUMIN 4.0 4.1  --      No results for input(s): LIPASE, AMYLASE in the last 8760 hours. No results for input(s): AMMONIA in the last 8760 hours. CBC: Recent Labs    01/18/17 1026 03/22/17 0857 07/11/17 1106  WBC 10.9* 8.9 9.6  NEUTROABS 7,630 5,251 6,614  HGB 11.2* 11.5* 11.0*  HCT 34.7* 36.1 33.2*  MCV 94.3 93.5 93.0  PLT 242 242 275   Lipid Panel: Recent Labs    03/22/17 0857  CHOL 138  HDL 25*  LDLCALC 86  TRIG 134  CHOLHDL 5.5*   Lab Results  Component Value Date   HGBA1C 6.7 (H) 07/11/2017    Procedures since last visit: No results found.  Assessment/Plan     ICD-10-CM   1. Bilateral primary osteoarthritis of knee M17.0   2. Anxiety F41.9 ALPRAZolam (XANAX) 0.5 MG tablet  3. Hypothyroidism due to acquired atrophy of thyroid E03.4 TSH    T4, Free  4. Controlled type 2 diabetes with neuropathy (HCC) E11.40 Lipid Panel  Hemoglobin A1c    Microalbumin/Creatinine Ratio, Urine  5. Paroxysmal A-fib (HCC) I48.0   6. Gastroesophageal reflux disease, esophagitis presence not specified K21.9 omeprazole (PRILOSEC) 40 MG capsule  7. High risk medication use Z79.899 CMP with eGFR([facility])    CBC with Differential/Platelets   Will call with lab results  Continue current medications as ordered  Follow up with Ortho as scheduled for knee pain  Follow up in 2 mos for chronic pain, anxiety, thyroid, DM, HTN    Tenicia Gural S. Perlie Gold  [facility] [address] [place], [place] [address] [phone] Cell (Monday-Friday 8 AM - 5 PM) [phone] After 5 PM and follow prompts

## 2017-11-21 NOTE — Patient Instructions (Addendum)
Will call with lab results  Continue current medications as ordered  Follow up with Ortho as scheduled for knee pain  Follow up in 2 mos for chronic pain, anxiety, thyroid, DM, HTN

## 2017-11-22 LAB — HEMOGLOBIN A1C
Hgb A1c MFr Bld: 7.1 % of total Hgb — ABNORMAL HIGH (ref <5.7)
Mean Plasma Glucose: 157 (calc)
eAG (mmol/L): 8.7 (calc)

## 2017-11-22 LAB — MICROALBUMIN / CREATININE URINE RATIO
CREATININE, URINE: 168 mg/dL (ref 20–275)
MICROALB UR: 3.3 mg/dL
MICROALB/CREAT RATIO: 20 ug/mg{creat} (ref <30)

## 2017-11-24 ENCOUNTER — Encounter: Payer: Self-pay | Admitting: Internal Medicine

## 2017-11-28 ENCOUNTER — Other Ambulatory Visit: Payer: Self-pay | Admitting: Internal Medicine

## 2017-11-28 DIAGNOSIS — R000 Tachycardia, unspecified: Secondary | ICD-10-CM

## 2017-12-11 ENCOUNTER — Other Ambulatory Visit: Payer: Self-pay | Admitting: *Deleted

## 2017-12-11 MED ORDER — HYDROCODONE-ACETAMINOPHEN 5-325 MG PO TABS: ORAL_TABLET | ORAL | 0 refills | Status: DC

## 2017-12-11 NOTE — Telephone Encounter (Signed)
Patient called requesting Refill [place] Verified LR: 11/10/2017 Pharmacy confirmed Pended Rx and sent to Dr. Eulas Post for approval.

## 2017-12-13 ENCOUNTER — Other Ambulatory Visit: Payer: Self-pay | Admitting: Internal Medicine

## 2017-12-13 ENCOUNTER — Other Ambulatory Visit: Payer: Self-pay | Admitting: *Deleted

## 2017-12-13 MED ORDER — INSULIN PEN NEEDLE 31G X 8 MM MISC: 11 refills | Status: DC

## 2017-12-13 NOTE — Telephone Encounter (Signed)
Patient requested 

## 2017-12-21 DIAGNOSIS — H353132 Nonexudative age-related macular degeneration, bilateral, intermediate dry stage: Secondary | ICD-10-CM | POA: Diagnosis not present

## 2017-12-21 DIAGNOSIS — H04123 Dry eye syndrome of bilateral lacrimal glands: Secondary | ICD-10-CM | POA: Diagnosis not present

## 2017-12-21 DIAGNOSIS — H524 Presbyopia: Secondary | ICD-10-CM | POA: Diagnosis not present

## 2017-12-21 DIAGNOSIS — H26492 Other secondary cataract, left eye: Secondary | ICD-10-CM | POA: Diagnosis not present

## 2017-12-21 DIAGNOSIS — E119 Type 2 diabetes mellitus without complications: Secondary | ICD-10-CM | POA: Diagnosis not present

## 2017-12-22 ENCOUNTER — Other Ambulatory Visit: Payer: Self-pay | Admitting: Internal Medicine

## 2017-12-22 DIAGNOSIS — F419 Anxiety disorder, unspecified: Secondary | ICD-10-CM

## 2017-12-22 LAB — HM DIABETES EYE EXAM

## 2017-12-22 NOTE — Telephone Encounter (Signed)
[place] Database verified and compliance confirmed   

## 2017-12-22 NOTE — Telephone Encounter (Signed)
Patient called to request a refill on alprazolam 0.5 mg. Rx was pended to provider for approval  after verifying last fill date, provider, and quantity on PMP [place]

## 2017-12-24 ENCOUNTER — Other Ambulatory Visit: Payer: Self-pay | Admitting: Internal Medicine

## 2017-12-24 DIAGNOSIS — E1122 Type 2 diabetes mellitus with diabetic chronic kidney disease: Secondary | ICD-10-CM

## 2017-12-24 DIAGNOSIS — N182 Chronic kidney disease, stage 2 (mild): Principal | ICD-10-CM

## 2017-12-24 DIAGNOSIS — Z794 Long term (current) use of insulin: Principal | ICD-10-CM

## 2017-12-24 DIAGNOSIS — IMO0002 Reserved for concepts with insufficient information to code with codable children: Secondary | ICD-10-CM

## 2017-12-24 DIAGNOSIS — E1165 Type 2 diabetes mellitus with hyperglycemia: Principal | ICD-10-CM

## 2017-12-31 ENCOUNTER — Other Ambulatory Visit: Payer: Self-pay | Admitting: Internal Medicine

## 2018-01-08 ENCOUNTER — Other Ambulatory Visit: Payer: Self-pay | Admitting: *Deleted

## 2018-01-08 DIAGNOSIS — Z794 Long term (current) use of insulin: Principal | ICD-10-CM

## 2018-01-08 DIAGNOSIS — N182 Chronic kidney disease, stage 2 (mild): Principal | ICD-10-CM

## 2018-01-08 DIAGNOSIS — E1165 Type 2 diabetes mellitus with hyperglycemia: Principal | ICD-10-CM

## 2018-01-08 DIAGNOSIS — IMO0002 Reserved for concepts with insufficient information to code with codable children: Secondary | ICD-10-CM

## 2018-01-08 DIAGNOSIS — E1122 Type 2 diabetes mellitus with diabetic chronic kidney disease: Secondary | ICD-10-CM

## 2018-01-08 MED ORDER — INSULIN LISPRO 100 UNIT/ML (KWIKPEN): PEN_INJECTOR | SUBCUTANEOUS | 3 refills | Status: DC

## 2018-01-08 NOTE — Telephone Encounter (Signed)
Patient requested Medication be faxed with directions to pharmacy.

## 2018-01-13 ENCOUNTER — Other Ambulatory Visit: Payer: Self-pay | Admitting: Internal Medicine

## 2018-01-13 DIAGNOSIS — E039 Hypothyroidism, unspecified: Secondary | ICD-10-CM

## 2018-01-17 ENCOUNTER — Other Ambulatory Visit: Payer: Self-pay | Admitting: *Deleted

## 2018-01-17 MED ORDER — HYDROCODONE-ACETAMINOPHEN 5-325 MG PO TABS: ORAL_TABLET | ORAL | 0 refills | Status: DC

## 2018-01-17 NOTE — Telephone Encounter (Signed)
Patient requested [place] Verified LR: 12/11/17 Pended Rx and sent to Dr. Eulas Post for approval.

## 2018-01-22 ENCOUNTER — Other Ambulatory Visit: Payer: Self-pay | Admitting: Internal Medicine

## 2018-01-22 DIAGNOSIS — F419 Anxiety disorder, unspecified: Secondary | ICD-10-CM

## 2018-01-22 NOTE — Telephone Encounter (Signed)
A medication refill was received from pharmacy for alprazolam  0.5 mg. Rx was called in to pharmacy after verifying last fill date, provider, and quantity on PMP [place] database.   

## 2018-01-23 ENCOUNTER — Ambulatory Visit: Payer: Medicare Other | Admitting: Internal Medicine

## 2018-01-23 ENCOUNTER — Telehealth: Payer: Self-pay | Admitting: *Deleted

## 2018-01-23 NOTE — Telephone Encounter (Signed)
Fax for DM testing supplies in your IN box for completion. Thanks!

## 2018-01-31 ENCOUNTER — Ambulatory Visit: Payer: Medicare Other | Admitting: Internal Medicine

## 2018-01-31 ENCOUNTER — Encounter: Payer: Self-pay | Admitting: Internal Medicine

## 2018-01-31 VITALS — BP 132/70 | HR 57 | Temp 97.6°F | Ht 65.0 in | Wt 146.0 lb

## 2018-01-31 DIAGNOSIS — R1310 Dysphagia, unspecified: Secondary | ICD-10-CM

## 2018-01-31 DIAGNOSIS — L309 Dermatitis, unspecified: Secondary | ICD-10-CM

## 2018-01-31 DIAGNOSIS — K219 Gastro-esophageal reflux disease without esophagitis: Secondary | ICD-10-CM

## 2018-01-31 DIAGNOSIS — E034 Atrophy of thyroid (acquired): Secondary | ICD-10-CM | POA: Diagnosis not present

## 2018-01-31 DIAGNOSIS — N184 Chronic kidney disease, stage 4 (severe): Secondary | ICD-10-CM

## 2018-01-31 DIAGNOSIS — E1140 Type 2 diabetes mellitus with diabetic neuropathy, unspecified: Secondary | ICD-10-CM | POA: Diagnosis not present

## 2018-01-31 DIAGNOSIS — I480 Paroxysmal atrial fibrillation: Secondary | ICD-10-CM | POA: Diagnosis not present

## 2018-01-31 DIAGNOSIS — Z79899 Other long term (current) drug therapy: Secondary | ICD-10-CM

## 2018-01-31 DIAGNOSIS — I10 Essential (primary) hypertension: Secondary | ICD-10-CM

## 2018-01-31 NOTE — Progress Notes (Signed)
Patient ID: Karen [name], female   DOB: 08-Nov-1923, 84 y.o.   MRN: [id number]   Location:  [facility] OFFICE  Provider: DR Arletha Grippe  Code Status:  Goals of Care:  Advanced Directives 07/11/2017  Does Patient Have a Medical Advance Directive? Yes  Type of Advance Directive Living will  Does patient want to make changes to medical advance directive? No - Patient declined  Copy of [place] in Chart? -  Would patient like information on creating a medical advance directive? -     Chief Complaint  Patient presents with  . Medical Management of Chronic Issues    2 month follow-up for chronic pain, anxiety, thyroid, DM and HTN. Patient c/o ongoing swallowing issues   . Knee Problem    Left knee is worse  . Rash    Rash x 1 month in different locations on body   . Form Completion    Patient assistance forms, patient is almost out of medication and not able to wait until she is established with new provider    HPI: Patient is a 84 y.o. female seen today for medical management of chronic diseases.  She has severe pain in b/l knees and is followed by Ortho. She gets steroid injections prn. She has other joint pain as well. BS stable overall but tend to increase post steroid joint injection. He pain is overall uncontrolled and she does not want to increase gabapentin 2/2 c/a ADRs. She c/o dysphagia to solids and liquids. Last EGD in 2014 with dilation 2/2 stricture. She was told she could not have additional dilations. She takes small bites and small sips of liquids and chews food carefully.  Son present  She plans to change PCP to one closer to her home. 1st Visit scheduled next month  DM - controlled. A1c 7.1%. BS fluctuating (as low as 70 -->200). She takes humalog and lantus. Neuropathy improved on gabapentin. LDL 88; urine microalbumin/Cr ratio 20  HTN/CHF - BP controlled on metoprolol. She takes prn SLNTG. Edema stable on lasix daily. She takes ASA  daily  PAF - rate controlled on metoprolol, digoxin and prn cardizem. She takes ASA daily for antiplatelet. No palpitations  Hypothyroidism - stable on levothyroxine. TSH 2.67; T4 free 1.2  Chronic pain syndrome/OA/lumbar spinal stenosis - she has joint stiffness. She uses topical diclofenac and takes norco  Recurrent UTI - stable on maintenance trimethoprim  CKD - currently stage 4. Cr 1.74 (prev 1.12)  Anemia of chronic disease - stable. Hgb 11.9  Candidal dermatitis - stable on topical CeraVe cream and triamcinolone cream    Past Medical History:  Diagnosis Date  . A-fib ([place])   . Abnormal involuntary movements(781.0)   . Anemia   . Anxiety   . Anxiety state, unspecified   . Arthritis    body, neck  . Benign hypertensive heart disease   . Benign hypertensive heart disease with congestive heart failure ([place])   . Bladder disorder    over active  . Cancer ([place])    skin, removed  . Cervicalgia   . CHF (congestive heart failure) ([place])   . Chronic kidney disease, stage III (moderate) (HCC)   . Congestive heart failure, unspecified   . Contact dermatitis and other eczema, due to unspecified cause   . Corns and callosities   . Diarrhea   . Dyspnea 02/18/2015  . Edema   . Esophageal stricture   . Gastroparesis   . Gastroparesis   .  GERD (gastroesophageal reflux disease)   . Hiatal hernia   . Hyperlipidemia   . Inflamed seborrheic keratosis   . Inflammatory disease of breast   . Irregular heartbeat   . Lumbago   . Mixed incontinence urge and stress (female)(female)   . Nausea alone   . Nonspecific (abnormal) findings on radiological and other examination of skull and head   . Osteoarthrosis, unspecified whether generalized or localized, unspecified site   . Other and unspecified hyperlipidemia   . Other B-complex deficiencies   . Other functional disorder of bladder   . Other malaise and fatigue   . Other specified visual disturbances   . Pain in joint, lower leg    . Palpitations   . Peripheral vascular disease, unspecified ([place])   . Postmenopausal atrophic vaginitis   . Reflux esophagitis   . Seborrheic keratosis   . Sleep apnea   . Spasm of muscle   . Spinal stenosis   . Spinal stenosis, unspecified region other than cervical   . Stricture and stenosis of esophagus   . Stroke ([place])   . TIA (transient ischemic attack) 2012  . Transient ischemic attack (TIA), and cerebral infarction without residual deficits(V12.54)   . Type II or unspecified type diabetes mellitus with renal manifestations, not stated as uncontrolled(250.40)   . Unspecified constipation   . Unspecified essential hypertension   . Unspecified hereditary and idiopathic peripheral neuropathy   . Unspecified hypothyroidism   . Unspecified pruritic disorder   . Unspecified sleep apnea   . Unspecified vitamin D deficiency   . Urinary tract infection, site not specified   . Vitamin B12 deficiency     Past Surgical History:  Procedure Laterality Date  . ABDOMINAL HYSTERECTOMY    . BACK SURGERY     x 2  . CHOLECYSTECTOMY  1991  . esophageal  2004,2006   dilation, Dr Lyla Son  . [place]   bilateral cataract  . hammer toes    . HERNIA REPAIR  1984aaaaaaaa  . SKIN CANCER DESTRUCTION    . THYROIDECTOMY, PARTIAL  1965     reports that she has never smoked. She has never used smokeless tobacco. She reports that she does not drink alcohol or use drugs. Social History   Socioeconomic History  . Marital status: Widowed    Spouse name: Not on file  . Number of children: 3  . Years of education: Not on file  . Highest education level: Not on file  Occupational History  . Occupation: retired  Scientific laboratory technician  . Financial resource strain: Not hard at all  . Food insecurity:    Worry: Never true    Inability: Never true  . Transportation needs:    Medical: No    Non-medical: No  Tobacco Use  . Smoking status: Never Smoker  . Smokeless tobacco: Never Used   Substance and Sexual Activity  . Alcohol use: No  . Drug use: No  . Sexual activity: Never  Lifestyle  . Physical activity:    Days per week: 0 days    Minutes per session: 0 min  . Stress: Rather much  Relationships  . Social connections:    Talks on phone: More than three times a week    Gets together: More than three times a week    Attends religious service: More than 4 times per year    Active member of club or organization: Yes    Attends meetings of clubs or  organizations: Never    Relationship status: Widowed  . Intimate partner violence:    Fear of current or ex partner: No    Emotionally abused: No    Physically abused: No    Forced sexual activity: No  Other Topics Concern  . Not on file  Social History Narrative  . Not on file    Family History  Problem Relation Age of Onset  . Cancer Mother        ? stomach  . Heart disease Father   . Hyperlipidemia Father   . Hypertension Father   . Heart attack Father   . Heart disease Brother   . Kidney disease Daughter   . Heart disease Brother   . Cancer Son     Allergies  Allergen Reactions  . Celebrex [Celecoxib] Rash  . Cymbalta [Duloxetine Hcl] Rash    Lip numbness,rash, leg and body jerks. Patient states " I though I was having a heart attack or stroke."   . Doxycycline Rash  . Lyrica [Pregabalin] Rash  . Avandia [Rosiglitazone]   . Macrodantin [Nitrofurantoin Macrocrystal]   . Sulfa Antibiotics   . Vioxx [Rofecoxib]   . Silicone Rash    Gets rash from the touch it.    Outpatient Encounter Medications as of 01/31/2018  Medication Sig  . ALPRAZolam (XANAX) 0.5 MG tablet TAKE 1 TABLET (0.5 MG TOTAL) BY MOUTH 3 (THREE) TIMES DAILY AS NEEDED.  Marland Kitchen aspirin 325 MG EC tablet Take 325 mg by mouth daily.  Marland Kitchen CRANBERRY PO Take 300 mg by mouth daily.  . [facility] NATURAL LUTEIN EYE HEALTH PO Take by mouth. One tablet once daily for eyes  . diclofenac sodium (VOLTAREN) 1 % GEL Apply 4 g topically 4 (four) times daily.  To bilateral knees  . DIGOX 125 MCG tablet TAKE 1 TABLET BY MOUTH ON MONDAY, WEDNESDAY, FRIDAY, AND SATURDAY  . furosemide (LASIX) 40 MG tablet One daily to control edema  . gabapentin (NEURONTIN) 100 MG capsule TAKE TWO CAPSULES BY MOUTH AT BEDTIME TO HELP WITH TREMORS  . HYDROcodone-acetaminophen (NORCO/VICODIN) 5-325 MG tablet Take one tablet by mouth four times daily as needed for pain  . insulin lispro (HUMALOG KWIKPEN) 100 UNIT/ML KiwkPen Inj 10u before breakfast, 14u before lunch, 14u before supper. May use up to 3 more units/meal prn per sliding scale max units 45. Dx:E11.29  . Insulin Pen Needle (B-D ULTRAFINE III SHORT PEN) 31G X 8 MM MISC Use with administering insulin. Dx. E11.29  . LANTUS SOLOSTAR 100 UNIT/ML Solostar Pen INJECT 42 UNITS INTO THE SKIN AT BEDTIME  . levothyroxine (SYNTHROID, LEVOTHROID) 125 MCG tablet TAKE ONE TABLET BY MOUTH EVERY MORNING 30 MINUTES BEFORE BREAKFAST  . metoprolol tartrate (LOPRESSOR) 50 MG tablet TAKE 1 TABLET BY MOUTH TWICE A DAY TO CONTROL HEART RATE  . Multiple Vitamins-Minerals (PRESERVISION AREDS 2 PO) Take by mouth. 1 by mouth two times daily  . nitroGLYCERIN (NITROSTAT) 0.4 MG SL tablet Place 1 tablet (0.4 mg total) under the tongue every 5 (five) minutes as needed for chest pain.  Marland Kitchen omeprazole (PRILOSEC) 40 MG capsule TAKE ONE (1) CAPSULE BY MOUTH 2 TIMES DAILY FOR STOMACH  . ondansetron (ZOFRAN) 4 MG tablet TAKE ONE TABLET BY MOUTH EVERY 4 HOURS AS NEEDED FOR NAUSEA/VOMITING  . ONE TOUCH ULTRA TEST test strip Use to test sugar three times daily dx E11.29  . Polyethyl Glycol-Propyl Glycol (SYSTANE OP) Apply to eye. One drop both eyes twice daily for dry eyes  .  triamcinolone cream (KENALOG) 0.1 % Mix with CeraVe cream apply twice daily as directed  . trimethoprim (TRIMPEX) 100 MG tablet TAKE ONE TABLET BY MOUTH EVERY NIGHT AT BEDTIME TO PREVENT BLADDER INFECTION.  Marland Kitchen Vitamin D, Ergocalciferol, (DRISDOL) 50000 units CAPS capsule TAKE 1 CAPSULE BY  MOUTH ON THE SAME DAY EVERY WEEK AS DIRECTED.  Marland Kitchen VITAMIN E PO Take 400 Units by mouth daily.   No facility-administered encounter medications on file as of 01/31/2018.     Review of Systems:  Review of Systems  HENT: Positive for trouble swallowing.   Musculoskeletal: Positive for arthralgias, back pain, gait problem and joint swelling.  Skin: Positive for rash.  All other systems reviewed and are negative.   Health Maintenance  Topic Date Due  . OPHTHALMOLOGY EXAM  09/26/2015  . INFLUENZA VACCINE  03/01/2018  . HEMOGLOBIN A1C  05/23/2018  . FOOT EXAM  08/16/2018  . URINE MICROALBUMIN  11/22/2018  . TETANUS/TDAP  07/21/2023  . DEXA SCAN  Completed  . PNA vac Low Risk Adult  Completed    Physical Exam: Vitals:   01/31/18 0901  BP: 132/70  Pulse: (!) 57  Temp: 97.6 F (36.4 C)  TempSrc: Oral  SpO2: 91%  Weight: 146 lb (66.2 kg)  Height: 5\' 5"  (1.651 m)   Body mass index is 24.3 kg/m. Physical Exam  Constitutional: She is oriented to person, place, and time. She appears well-developed and well-nourished.  HENT:  Mouth/Throat: Oropharynx is clear and moist. No oropharyngeal exudate.  MMM; no oral thrush  Eyes: Pupils are equal, round, and reactive to light. No scleral icterus.  Neck: Neck supple. Carotid bruit is not present. Decreased range of motion present. No tracheal deviation present. No thyromegaly present.    Cardiovascular: Normal rate, regular rhythm and intact distal pulses. Exam reveals no gallop and no friction rub.  Murmur (2/6 SEM) heard. No LE edema b/l. no calf TTP.   Pulmonary/Chest: Effort normal and breath sounds normal. No stridor. No respiratory distress. She has no wheezes. She has no rales.  Abdominal: Soft. Normal appearance and bowel sounds are normal. She exhibits no distension and no mass. There is no hepatomegaly. There is tenderness (epigastric). There is no rigidity, no rebound and no guarding. No hernia.  Musculoskeletal: She exhibits  edema (small, intermediate and large), tenderness and deformity (joints).       Lumbar back: She exhibits decreased range of motion, tenderness and spasm.       Back:  R>L knee swelling  Lymphadenopathy:    She has no cervical adenopathy.  Neurological: She is alert and oriented to person, place, and time. She has normal reflexes. Gait (antalgiac; unsteady; uses rolling walker) abnormal.  Skin: Skin is warm and dry. Rash (eczematous) noted.  Psychiatric: She has a normal mood and affect. Her behavior is normal. Judgment and thought content normal.    Labs reviewed: Basic Metabolic Panel: Recent Labs    03/22/17 0857 07/11/17 1106 11/21/17 1500  NA 139 138 142  K 5.3 5.0 4.7  CL 101 102 106  CO2 26 29 30   GLUCOSE 153* 212* 78  BUN 28* 16 32*  CREATININE 1.57* 1.12* 1.74*  CALCIUM 8.7 8.6 8.6  TSH  --  3.44 2.67   Liver Function Tests: Recent Labs    03/22/17 0857 07/11/17 1106 11/21/17 1500  AST 20 18 26   ALT 18 15 28   ALKPHOS 118  --   --   BILITOT 0.4 0.4 0.4  PROT 6.4  6.3 6.4  ALBUMIN 4.1  --   --    No results for input(s): LIPASE, AMYLASE in the last 8760 hours. No results for input(s): AMMONIA in the last 8760 hours. CBC: Recent Labs    03/22/17 0857 07/11/17 1106 11/21/17 1500  WBC 8.9 9.6 10.5  NEUTROABS 5,251 6,614 6,846  HGB 11.5* 11.0* 11.9  HCT 36.1 33.2* 35.0  MCV 93.5 93.0 91.4  PLT 242 275 233   Lipid Panel: Recent Labs    03/22/17 0857 11/21/17 1500  CHOL 138 152  HDL 25* 27*  LDLCALC 86 88  TRIG 134 307*  CHOLHDL 5.5* 5.6*   Lab Results  Component Value Date   HGBA1C 7.1 (H) 11/21/2017    Procedures since last visit: No results found.  Assessment/Plan   ICD-10-CM   1. Dysphagia, unspecified type R13.10   2. Gastroesophageal reflux disease, esophagitis presence not specified K21.9   3. Controlled type 2 diabetes with neuropathy (HCC) E11.40   4. Hypothyroidism due to acquired atrophy of thyroid E03.4   5. Paroxysmal  A-fib (HCC) I48.0   6. High risk medication use Z79.899   7. Essential hypertension I10   8. Dermatitis L30.9   9. CKD (chronic kidney disease) stage 4, GFR 15-29 ml/min (HCC) N18.4     Forms to be completed and signed  RECOMMEND PUREED FOODS DUE TO WORSENING SWALLOWING CONCERNS - she is not a candidate for repeat esophageal dilation per GI  INCREASE HYDROCODONE 7.5/325 WITH NEXT SCRIPT  Continue other medications as ordered  Follow up with specialists as scheduled  Follow up with new PCP next month as scheduled. Will need fasting labs (A1c, TSH, CMP, lipid panel, digoxin level)  Rikia Sukhu S. Perlie Gold  [facility] [address] [place], [place] [address] [phone] Cell (Monday-Friday 8 AM - 5 PM) [phone] After 5 PM and follow prompts

## 2018-01-31 NOTE — Patient Instructions (Addendum)
RECOMMEND PUREED FOODS DUE TO WORSENING SWALLOWING CONCERNS  INCREASE HYDROCODONE 7.5/325 WITH NEXT SCRIPT  Continue other medications as ordered  Follow up with specialists as scheduled  Follow up with new PCP next month as scheduled. Will need fasting labs (A1c, TSH, CMP, lipid panel, digoxin level)

## 2018-02-06 ENCOUNTER — Telehealth: Payer: Self-pay

## 2018-02-06 NOTE — Telephone Encounter (Signed)
Patient's son called to find out why we have not sent in the [facility] patient assistance paperwork. I called [employer] at [phone] and was told that the paperwork was received on 02-02-18 but it has not been processed yet.   It will not be processed until patient provides a print out from her pharmacy showing how much she has spent on medications since January 2019. If patient has not met her $1100 deductible, then patient will have to write a letter explaining why she cannot afford to pay for her medications.   I left a message asking that patient's son call the office.

## 2018-02-06 NOTE — Telephone Encounter (Signed)
A fax was received from [facility] stating that patient has been approved to receive Lantus through their patient assistance program. Patient is eligible to receive shipments to this office until 07/31/18.   I left a message explaining this response on patient's son's voicemail.

## 2018-02-08 ENCOUNTER — Telehealth: Payer: Self-pay

## 2018-02-08 NOTE — Telephone Encounter (Signed)
I spoke with patient to let her know that she received a delivery of lantus from [facility] today. Patient received 3 boxes. The medication was placed in the fridge in the medication room.   Patient stated that it may be Monday before she was able to have it picked up.

## 2018-02-08 NOTE — Telephone Encounter (Signed)
Mr. Karen [name] called back to say that the pharmacy print out and a copy of pt financial statement was included in original paperwork packet that was left with the office. I informed Mr. Karen [name] that the paperwork has been sent to our scanning center and I would not have access to it until it was scanned into patient's chart. He stated that patient's caregiver has already requested a new copy of print out from the pharmacy and a page that provider was supposed to sign but didn't. I let Mr. Karen [name] know that I would check each day to see if documents have been scanned in and once they are, I will re-fax to [facility]. He verbalized understanding.

## 2018-02-15 ENCOUNTER — Other Ambulatory Visit: Payer: Self-pay | Admitting: *Deleted

## 2018-02-15 MED ORDER — HYDROCODONE-ACETAMINOPHEN 7.5-325 MG PO TABS
1.0000 | ORAL_TABLET | Freq: Four times a day (QID) | ORAL | 0 refills | Status: DC
Start: 2018-02-15 — End: 2018-03-19

## 2018-02-15 NOTE — Telephone Encounter (Signed)
Patient requested Refill. Reviewed last OV note and pain medication increased.  [place] Verified LR: 01/17/18 Pharmacy Confirmed Pended Rx and sent to Dr. Eulas Post for approval.

## 2018-02-19 DIAGNOSIS — M170 Bilateral primary osteoarthritis of knee: Secondary | ICD-10-CM | POA: Diagnosis not present

## 2018-02-20 ENCOUNTER — Other Ambulatory Visit: Payer: Self-pay | Admitting: Internal Medicine

## 2018-02-20 DIAGNOSIS — F419 Anxiety disorder, unspecified: Secondary | ICD-10-CM

## 2018-02-20 NOTE — Telephone Encounter (Signed)
A medication refill was received from pharmacy for alprazolam 0.5 mg and trimethoprim 100 mg. Rx was called in to pharmacy for a 30 day supply after verifying last fill date, provider, and quantity on PMP [place] database. Marland Kitchen

## 2018-03-05 ENCOUNTER — Other Ambulatory Visit: Payer: Self-pay | Admitting: Internal Medicine

## 2018-03-05 DIAGNOSIS — M170 Bilateral primary osteoarthritis of knee: Secondary | ICD-10-CM | POA: Diagnosis not present

## 2018-03-12 DIAGNOSIS — M170 Bilateral primary osteoarthritis of knee: Secondary | ICD-10-CM | POA: Diagnosis not present

## 2018-03-14 ENCOUNTER — Encounter: Payer: Self-pay | Admitting: Podiatry

## 2018-03-14 ENCOUNTER — Ambulatory Visit (INDEPENDENT_AMBULATORY_CARE_PROVIDER_SITE_OTHER): Payer: Medicare Other

## 2018-03-14 ENCOUNTER — Other Ambulatory Visit: Payer: Self-pay

## 2018-03-14 ENCOUNTER — Ambulatory Visit: Payer: Medicare Other | Admitting: Podiatry

## 2018-03-14 ENCOUNTER — Other Ambulatory Visit: Payer: Self-pay | Admitting: Podiatry

## 2018-03-14 DIAGNOSIS — M79671 Pain in right foot: Secondary | ICD-10-CM

## 2018-03-14 DIAGNOSIS — R600 Localized edema: Secondary | ICD-10-CM | POA: Diagnosis not present

## 2018-03-14 DIAGNOSIS — M79672 Pain in left foot: Secondary | ICD-10-CM

## 2018-03-14 DIAGNOSIS — M779 Enthesopathy, unspecified: Secondary | ICD-10-CM

## 2018-03-14 MED ORDER — FUROSEMIDE 40 MG PO TABS: ORAL_TABLET | ORAL | 0 refills | Status: DC

## 2018-03-14 MED ORDER — TRIAMCINOLONE ACETONIDE 10 MG/ML IJ SUSP
10.0000 mg | Freq: Once | INTRAMUSCULAR | Status: AC
  Administered 2018-03-14: 10 mg

## 2018-03-14 NOTE — Telephone Encounter (Signed)
A [facility] was received from [facility] requesting a refill on furosemide 40 mg tablets. Rx was sent to pharmacy electronically.

## 2018-03-14 NOTE — Progress Notes (Signed)
Subjective:   Patient ID: Karen [name], female   DOB: 84 y.o.   MRN: [id number]   HPI Patient presents in very poor health with caregiver with chronic pain of both feet but the acute intensification of discomfort left forefoot stating its been about a month it is gotten worse.  Patient does not smoke and is not active   Review of Systems  All other systems reviewed and are negative.       Objective:  Physical Exam  Constitutional: She appears well-developed and well-nourished.  Cardiovascular: Intact distal pulses.  Pulmonary/Chest: Effort normal.  Musculoskeletal: Normal range of motion.  Neurological: She is alert.  Skin: Skin is warm.  Nursing note and vitals reviewed.   Neurovascular status moderately diminished bilateral diminished and weakened DP PT pulses.  Patient is found to have varicosities in the ankle region bilateral with +1 pitting edema negative Homans sign noted and is found to have inflammation of the forefoot left over right with inflammation around the second and third metatarsal phalangeal joint and mild discomfort around the fifth digit and fifth MPJ.  Is good digital perfusion well oriented x3     Assessment:  Inflammatory capsulitis of the lesser MPJs left with moderate discomfort around the fifth MPJ     Plan:  H&P conditions reviewed and will get a try to do the best we can to help this patient.  I injected the second and third MPJs separately 3 mg Dexasone Kenalog 5 mg Xylocaine and advised on reduced activity and reappoint to recheck  X-rays indicate significant osteoporosis bilateral with multiple signs of arthritis also noted

## 2018-03-19 ENCOUNTER — Other Ambulatory Visit: Payer: Self-pay | Admitting: Internal Medicine

## 2018-03-19 DIAGNOSIS — E559 Vitamin D deficiency, unspecified: Secondary | ICD-10-CM

## 2018-03-19 DIAGNOSIS — M170 Bilateral primary osteoarthritis of knee: Secondary | ICD-10-CM | POA: Diagnosis not present

## 2018-03-19 DIAGNOSIS — F419 Anxiety disorder, unspecified: Secondary | ICD-10-CM

## 2018-03-19 MED ORDER — ALPRAZOLAM 0.5 MG PO TABS: 0.5000 mg | ORAL_TABLET | Freq: Three times a day (TID) | ORAL | 0 refills | Status: DC | PRN

## 2018-03-19 MED ORDER — HYDROCODONE-ACETAMINOPHEN 7.5-325 MG PO TABS: 1.0000 | ORAL_TABLET | Freq: Four times a day (QID) | ORAL | 0 refills | Status: DC

## 2018-03-19 NOTE — Telephone Encounter (Signed)
Patient requested refill [place] Verified LR: 02/15/2018 Pharmacy Confirmed Pended and sent to Dr. Eulas Post for approval.

## 2018-03-20 ENCOUNTER — Other Ambulatory Visit: Payer: Self-pay | Admitting: Internal Medicine

## 2018-03-21 ENCOUNTER — Encounter: Payer: Self-pay | Admitting: Internal Medicine

## 2018-03-22 ENCOUNTER — Telehealth: Payer: Self-pay | Admitting: Family Medicine

## 2018-03-22 ENCOUNTER — Encounter

## 2018-03-22 ENCOUNTER — Encounter: Payer: Self-pay | Admitting: Family Medicine

## 2018-03-22 ENCOUNTER — Ambulatory Visit: Payer: Medicare Other | Admitting: Family Medicine

## 2018-03-22 VITALS — BP 160/63 | HR 44 | Temp 97.6°F | Ht 63.0 in | Wt 137.8 lb

## 2018-03-22 DIAGNOSIS — E034 Atrophy of thyroid (acquired): Secondary | ICD-10-CM

## 2018-03-22 DIAGNOSIS — G894 Chronic pain syndrome: Secondary | ICD-10-CM

## 2018-03-22 DIAGNOSIS — E1340 Other specified diabetes mellitus with diabetic neuropathy, unspecified: Secondary | ICD-10-CM | POA: Insufficient documentation

## 2018-03-22 DIAGNOSIS — R1310 Dysphagia, unspecified: Secondary | ICD-10-CM

## 2018-03-22 DIAGNOSIS — E785 Hyperlipidemia, unspecified: Secondary | ICD-10-CM

## 2018-03-22 DIAGNOSIS — I480 Paroxysmal atrial fibrillation: Secondary | ICD-10-CM

## 2018-03-22 DIAGNOSIS — E1140 Type 2 diabetes mellitus with diabetic neuropathy, unspecified: Secondary | ICD-10-CM

## 2018-03-22 DIAGNOSIS — M170 Bilateral primary osteoarthritis of knee: Secondary | ICD-10-CM

## 2018-03-22 DIAGNOSIS — G4733 Obstructive sleep apnea (adult) (pediatric): Secondary | ICD-10-CM

## 2018-03-22 DIAGNOSIS — K219 Gastro-esophageal reflux disease without esophagitis: Secondary | ICD-10-CM

## 2018-03-22 DIAGNOSIS — F411 Generalized anxiety disorder: Secondary | ICD-10-CM

## 2018-03-22 DIAGNOSIS — I10 Essential (primary) hypertension: Secondary | ICD-10-CM

## 2018-03-22 DIAGNOSIS — N183 Chronic kidney disease, stage 3 unspecified: Secondary | ICD-10-CM

## 2018-03-22 DIAGNOSIS — L299 Pruritus, unspecified: Secondary | ICD-10-CM

## 2018-03-22 DIAGNOSIS — Z8673 Personal history of transient ischemic attack (TIA), and cerebral infarction without residual deficits: Secondary | ICD-10-CM | POA: Insufficient documentation

## 2018-03-22 LAB — COMPREHENSIVE METABOLIC PANEL
ALBUMIN: 4.5 g/dL (ref 3.5–5.2)
ALK PHOS: 118 U/L — AB (ref 39–117)
ALT: 25 U/L (ref 0–35)
AST: 29 U/L (ref 0–37)
BUN: 42 mg/dL — AB (ref 6–23)
CO2: 31 mEq/L (ref 19–32)
CREATININE: 2.23 mg/dL — AB (ref 0.40–1.20)
Calcium: 9.2 mg/dL (ref 8.4–10.5)
Chloride: 102 mEq/L (ref 96–112)
GFR: 21.85 mL/min — ABNORMAL LOW (ref >60.00)
Glucose, Bld: 40 mg/dL — CL (ref 70–99)
POTASSIUM: 5.3 meq/L — AB (ref 3.5–5.1)
SODIUM: 139 meq/L (ref 135–145)
TOTAL PROTEIN: 7.6 g/dL (ref 6.0–8.3)
Total Bilirubin: 0.5 mg/dL (ref 0.2–1.2)

## 2018-03-22 LAB — LDL CHOLESTEROL, DIRECT: LDL DIRECT: 122 mg/dL

## 2018-03-22 LAB — LIPID PANEL
CHOLESTEROL: 169 mg/dL (ref 0–200)
HDL: 30.7 mg/dL — ABNORMAL LOW (ref >39.00)
NonHDL: 138.58
TRIGLYCERIDES: 234 mg/dL — AB (ref 0.0–149.0)
Total CHOL/HDL Ratio: 6
VLDL: 46.8 mg/dL — ABNORMAL HIGH (ref 0.0–40.0)

## 2018-03-22 LAB — T4, FREE: Free T4: 0.91 ng/dL (ref 0.60–1.60)

## 2018-03-22 LAB — T3, FREE: T3, Free: 3 pg/mL (ref 2.3–4.2)

## 2018-03-22 LAB — HEMOGLOBIN A1C: HEMOGLOBIN A1C: 8 % — AB (ref 4.6–6.5)

## 2018-03-22 LAB — TSH: TSH: 3.56 u[IU]/mL (ref 0.35–4.50)

## 2018-03-22 NOTE — Assessment & Plan Note (Signed)
Has used CPAP in past, but did not like. She stopped use.  She  Feels well rested in AM, no MA headahce.

## 2018-03-22 NOTE — Assessment & Plan Note (Signed)
HX of stricture. Dr. Henrene Pastor.   Chronic dysphagia now. Tolerating well.. Not a candidate to repeat.

## 2018-03-22 NOTE — Telephone Encounter (Signed)
[facility] lab called with critical results. Glucose 40 Results given verbally to Dr.Bedsole at 4:50pm.

## 2018-03-22 NOTE — Patient Instructions (Signed)
Please stop at the lab to have labs drawn.  

## 2018-03-22 NOTE — Assessment & Plan Note (Signed)
At goal < 100 last check,  o n no medicaiton.

## 2018-03-22 NOTE — Assessment & Plan Note (Signed)
2012 On aspirin for anticoagulation

## 2018-03-22 NOTE — Assessment & Plan Note (Signed)
Well controlled. Continue current medication.  

## 2018-03-22 NOTE — Assessment & Plan Note (Signed)
Secondary to diabetes. 

## 2018-03-22 NOTE — Assessment & Plan Note (Signed)
Using 3 tabs each day as needed for anxiety.Marland Kitchen Has been on this for years. Has tried to stop in past but unable to come.  Had trial of cymbalta but had a lot of SE.

## 2018-03-22 NOTE — Assessment & Plan Note (Signed)
Stable last check on levothyroxine.

## 2018-03-22 NOTE — Telephone Encounter (Addendum)
Ms. Karen [name] notified as instructed by telephone.  She states she did eat breakfast this morning but her blood sugar was low when she woke up this morning.  She states her and her son had lunch after they left the office and she currently feels fine.  She will call us if she finds that her blood sugars continue to run low for medication adjustment.

## 2018-03-22 NOTE — Assessment & Plan Note (Signed)
Chronic pain management.. On chronic hydrocodone regularly.

## 2018-03-22 NOTE — Assessment & Plan Note (Signed)
Managed moderately with gabapentin.

## 2018-03-22 NOTE — Assessment & Plan Note (Signed)
Well controlled on prilosec daily.

## 2018-03-22 NOTE — Assessment & Plan Note (Addendum)
Dr. Mardelle Matte.Marland Kitchen Recent gel injections x 3 .  Moderate control.  Using  Diclofenac gel as needed.

## 2018-03-22 NOTE — Assessment & Plan Note (Signed)
Controlled on humalog  Before meals 3 x times a day. Lantus 42 units at bedtime.

## 2018-03-22 NOTE — Telephone Encounter (Signed)
Please call pt and son ASAP. Glucose was 40.  She had not eaten and it was prior to lunch, but please make sure she is okay and has eaten. Alos make sure she does not skip meals and if having frequent low.. Call for med adjustment.

## 2018-03-22 NOTE — Assessment & Plan Note (Signed)
Skin feels like it is chronically itchy, intermittantly. Using CeraVe and triamcinolone cream to treat.

## 2018-03-22 NOTE — Progress Notes (Signed)
Subjective:    Patient ID: Karen [name], female    DOB: 08-24-1923, 84 y.o.   MRN: [id number]  HPI  84 year old female pt presents to establish care.  Son is here with her.   PCP Dr. Carlota Raspberry,  Dr. Eulas [facility].  Last OV  Month for dysphagia, chornic  DM - controlled. A1c 7.1%. BS fluctuating (as low as 70 -->200). She takes humalog and lantus. FBS 78 this morning.  Neuropathy improved on gabapentin.    HTN - BP controlled me at home on metoprolol.  She is nervous at appt today. She takes prn SLNTG. Edema stable on lasix daily. She takes ASA daily  Had TIA in 2012  PAF - rate controlled on metoprolol, digoxin and prn cardizem. She takes ASA daily for antiplatelet. No palpitations  Hypothyroidism - stable on levothyroxine at last check in 10/2017  Chronic pain syndrome/OA/lumbar spinal stenosis - she has joint stiffness. She uses topical diclofenac and takes hydrocodone 4 times a day.. Recent increase in dose to 7.5 /325 mg daily.  Recurrent UTI - stable on maintenance trimethoprim  CKD - currently stage 4. Cr 1.74 (prev 1.12)  Anemia of chronic disease - stable. Hgb 11.9    Hyperlipidemia  At goal < 100 last check Lab Results  Component Value Date   CHOL 152 11/21/2017   HDL 27 (L) 11/21/2017   LDLCALC 88 11/21/2017   TRIG 307 (H) 11/21/2017   CHOLHDL 5.6 (H) 11/21/2017    Dr. Paulla Dolly  Podiatry. Arthritis feet.    [place]  Widowed, son helps her  A lot.  Drives her.     Blood pressure (!) 160/63, pulse (!) 44, temperature 97.6 F (36.4 C), temperature source Oral, height 5\' 3"  (1.6 m), weight 137 lb 12 oz (62.5 kg).   Review of Systems  Constitutional: Positive for fatigue. Negative for fever.  HENT: Negative for congestion.   Eyes: Negative for pain.  Respiratory: Negative for cough and shortness of breath.   Cardiovascular: Positive for leg swelling. Negative for chest pain.  Gastrointestinal:  Negative for abdominal pain.  Genitourinary: Negative for dysuria and vaginal bleeding.  Musculoskeletal: Positive for back pain.  Neurological: Negative for syncope, light-headedness and headaches.  Psychiatric/Behavioral: Negative for dysphoric mood. The patient is nervous/anxious.        Objective:   Physical Exam  Constitutional: Vital signs are normal. She appears well-developed and well-nourished. She is cooperative.  Non-toxic appearance. She does not appear ill. No distress.  elderly female in NAD  HENT:  Head: Normocephalic.  Right Ear: Hearing, tympanic membrane, external ear and ear canal normal.  Left Ear: Hearing, tympanic membrane, external ear and ear canal normal.  Nose: Nose normal.  Eyes: Pupils are equal, round, and reactive to light. Conjunctivae, EOM and lids are normal. Lids are everted and swept, no foreign bodies found.  Neck: Trachea normal and normal range of motion. Neck supple. Carotid bruit is not present. No thyroid mass and no thyromegaly present.  Cardiovascular: Normal rate, S1 normal, S2 normal, normal heart sounds and intact distal pulses. An irregularly irregular rhythm present. Exam reveals no gallop.  No murmur heard.  Bilateral peripheral edema  Pulmonary/Chest: Effort normal and breath sounds normal. No respiratory distress. She has no wheezes. She has no rhonchi. She has no rales. No breast tenderness, discharge or bleeding.  Abdominal: Soft. Normal appearance and bowel sounds are normal. She exhibits no distension, no fluid  wave, no abdominal bruit and no mass. There is no hepatosplenomegaly. There is no tenderness. There is no rebound, no guarding and no CVA tenderness. No hernia.  Genitourinary: Vagina normal and uterus normal. Pelvic exam was performed with patient supine. There is no rash, tenderness or lesion on the right labia. There is no rash, tenderness or lesion on the left labia. Uterus is not enlarged and not tender. Cervix exhibits no  motion tenderness, no discharge and no friability. Right adnexum displays no mass, no tenderness and no fullness. Left adnexum displays no mass, no tenderness and no fullness.  Musculoskeletal:       Thoracic back: She exhibits decreased range of motion and tenderness.       Lumbar back: She exhibits decreased range of motion and tenderness.  Lymphadenopathy:    She has no cervical adenopathy.    She has no axillary adenopathy.  Neurological: She is alert. She displays atrophy. No cranial nerve deficit or sensory deficit. She exhibits abnormal muscle tone. Coordination and gait abnormal.  Skin: Skin is warm, dry and intact. No rash noted.  Psychiatric: Her speech is normal and behavior is normal. Judgment normal. Her mood appears not anxious. Cognition and memory are normal. She does not exhibit a depressed mood.          Assessment & Plan:

## 2018-03-22 NOTE — Assessment & Plan Note (Addendum)
Rate controlled on metoprolol, digoxin and prn cardizem. She takes ASA daily for antiplatelet.

## 2018-03-26 ENCOUNTER — Other Ambulatory Visit: Payer: Self-pay | Admitting: Family Medicine

## 2018-03-26 DIAGNOSIS — N183 Chronic kidney disease, stage 3 unspecified: Secondary | ICD-10-CM

## 2018-03-26 DIAGNOSIS — E1140 Type 2 diabetes mellitus with diabetic neuropathy, unspecified: Secondary | ICD-10-CM

## 2018-04-03 ENCOUNTER — Other Ambulatory Visit (INDEPENDENT_AMBULATORY_CARE_PROVIDER_SITE_OTHER): Payer: Medicare Other

## 2018-04-03 ENCOUNTER — Telehealth: Payer: Self-pay | Admitting: Radiology

## 2018-04-03 DIAGNOSIS — N183 Chronic kidney disease, stage 3 unspecified: Secondary | ICD-10-CM

## 2018-04-03 DIAGNOSIS — G894 Chronic pain syndrome: Secondary | ICD-10-CM | POA: Diagnosis not present

## 2018-04-03 DIAGNOSIS — E1140 Type 2 diabetes mellitus with diabetic neuropathy, unspecified: Secondary | ICD-10-CM

## 2018-04-03 LAB — BASIC METABOLIC PANEL
BUN: 32 mg/dL — AB (ref 6–23)
CALCIUM: 9 mg/dL (ref 8.4–10.5)
CO2: 28 mEq/L (ref 19–32)
Chloride: 99 mEq/L (ref 96–112)
Creatinine, Ser: 1.56 mg/dL — ABNORMAL HIGH (ref 0.40–1.20)
GFR: 32.99 mL/min — ABNORMAL LOW (ref >60.00)
GLUCOSE: 129 mg/dL — AB (ref 70–99)
POTASSIUM: 4.8 meq/L (ref 3.5–5.1)
SODIUM: 135 meq/L (ref 135–145)

## 2018-04-03 NOTE — Telephone Encounter (Signed)
Patient is having trouble getting a UDS sample x 2. Please advise

## 2018-04-03 NOTE — Telephone Encounter (Signed)
Patient was now able to leave a sample

## 2018-04-03 NOTE — Addendum Note (Signed)
Addended by: Ellamae Sia on: 04/03/2018 10:08 AM   Modules accepted: Orders

## 2018-04-04 ENCOUNTER — Other Ambulatory Visit: Payer: Self-pay | Admitting: Family Medicine

## 2018-04-04 NOTE — Telephone Encounter (Signed)
Last office visit 03/26/2018.  Last refilled 03/20/2018 for #30 with no refills by Gildardo Cranker, DO.  Ok to refill?

## 2018-04-06 LAB — PAIN MGMT, PROFILE 8 W/CONF, U
6 ACETYLMORPHINE: NEGATIVE ng/mL (ref <10)
ALPHAHYDROXYALPRAZOLAM: 83 ng/mL — AB (ref <25)
ALPHAHYDROXYTRIAZOLAM: NEGATIVE ng/mL (ref <50)
Alcohol Metabolites: NEGATIVE ng/mL (ref <500)
Alphahydroxymidazolam: NEGATIVE ng/mL (ref <50)
Aminoclonazepam: NEGATIVE ng/mL (ref <25)
Amphetamines: NEGATIVE ng/mL (ref <500)
BUPRENORPHINE, URINE: NEGATIVE ng/mL (ref <5)
Benzodiazepines: POSITIVE ng/mL — AB (ref <100)
CODEINE: NEGATIVE ng/mL (ref <50)
Cocaine Metabolite: NEGATIVE ng/mL (ref <150)
Creatinine: 24 mg/dL
HYDROXYETHYLFLURAZEPAM: NEGATIVE ng/mL (ref <50)
Hydrocodone: 457 ng/mL — ABNORMAL HIGH (ref <50)
Hydromorphone: 151 ng/mL — ABNORMAL HIGH (ref <50)
Lorazepam: NEGATIVE ng/mL (ref <50)
MARIJUANA METABOLITE: NEGATIVE ng/mL (ref <20)
MDMA: NEGATIVE ng/mL (ref <500)
Morphine: NEGATIVE ng/mL (ref <50)
NORHYDROCODONE: 515 ng/mL — AB (ref <50)
Nordiazepam: NEGATIVE ng/mL (ref <50)
OPIATES: POSITIVE ng/mL — AB (ref <100)
OXAZEPAM: NEGATIVE ng/mL (ref <50)
OXYCODONE: NEGATIVE ng/mL (ref <100)
Oxidant: NEGATIVE ug/mL (ref <200)
Temazepam: NEGATIVE ng/mL (ref <50)
pH: 6 (ref 4.5–9.0)

## 2018-04-07 ENCOUNTER — Other Ambulatory Visit: Payer: Self-pay | Admitting: Internal Medicine

## 2018-04-09 ENCOUNTER — Other Ambulatory Visit: Payer: Self-pay | Admitting: *Deleted

## 2018-04-09 NOTE — Telephone Encounter (Signed)
Last office 03/22/2018.  Last refilled 03/14/2018 by Gildardo Cranker, DO.  Ok to refill?

## 2018-04-10 MED ORDER — FUROSEMIDE 40 MG PO TABS: ORAL_TABLET | ORAL | 0 refills | Status: DC

## 2018-04-13 ENCOUNTER — Other Ambulatory Visit: Payer: Self-pay | Admitting: Internal Medicine

## 2018-04-13 DIAGNOSIS — E039 Hypothyroidism, unspecified: Secondary | ICD-10-CM

## 2018-04-14 DIAGNOSIS — G894 Chronic pain syndrome: Secondary | ICD-10-CM | POA: Insufficient documentation

## 2018-04-16 ENCOUNTER — Other Ambulatory Visit: Payer: Self-pay | Admitting: Family Medicine

## 2018-04-16 ENCOUNTER — Other Ambulatory Visit: Payer: Self-pay | Admitting: Internal Medicine

## 2018-04-16 DIAGNOSIS — E559 Vitamin D deficiency, unspecified: Secondary | ICD-10-CM

## 2018-04-19 ENCOUNTER — Other Ambulatory Visit: Payer: Self-pay

## 2018-04-19 ENCOUNTER — Other Ambulatory Visit: Payer: Self-pay | Admitting: Family Medicine

## 2018-04-19 DIAGNOSIS — F419 Anxiety disorder, unspecified: Secondary | ICD-10-CM

## 2018-04-19 DIAGNOSIS — G894 Chronic pain syndrome: Secondary | ICD-10-CM

## 2018-04-19 MED ORDER — HYDROCODONE-ACETAMINOPHEN 7.5-325 MG PO TABS: 1.0000 | ORAL_TABLET | Freq: Four times a day (QID) | ORAL | 0 refills | Status: DC

## 2018-04-19 NOTE — Telephone Encounter (Signed)
Please put in 3 months worth of refills to last to next OV.

## 2018-04-19 NOTE — Telephone Encounter (Signed)
Name of Medication: Hydrocodone apap 7.5-325 mg Name of [facility]acy: [facility] or Written Date and Quantity:# 120 on 03/19/18  Last Office Visit and Type: 03/22/18 Est care Next Office Visit and Type: 07/06/18 CPX Last Controlled Substance Agreement Date: 03/23/18  Last UDS:04/03/18

## 2018-04-19 NOTE — Telephone Encounter (Signed)
Copied from [employer] [id number]. Topic: General - Other >> Apr 19, 2018 10:03 AM Carolyn Stare wrote: Pt req refill   HYDROcodone-acetaminophen (NORCO) 7.5-325 MG tablet     Pharmacy   [facility]

## 2018-04-19 NOTE — Telephone Encounter (Signed)
Last office visit 03/22/2018 to establish care.  CPE schedule for 07/06/2018.  Last refilled 03/19/2018 for #90 with no refills by Gildardo Cranker, DO.  UDS done 04/03/2018.  Contract signed 03/22/2018.  Ok to refill?

## 2018-04-30 ENCOUNTER — Telehealth: Payer: Self-pay

## 2018-04-30 NOTE — Telephone Encounter (Signed)
A delivery was received from [facility] for lantus solostar pens. Patient was notified and medication was placed in clinical fridge. Pt will have someone pick it up.

## 2018-05-08 ENCOUNTER — Other Ambulatory Visit: Payer: Self-pay | Admitting: Internal Medicine

## 2018-05-08 DIAGNOSIS — E039 Hypothyroidism, unspecified: Secondary | ICD-10-CM

## 2018-05-18 ENCOUNTER — Other Ambulatory Visit: Payer: Self-pay | Admitting: Family Medicine

## 2018-05-18 DIAGNOSIS — F419 Anxiety disorder, unspecified: Secondary | ICD-10-CM

## 2018-05-18 NOTE — Telephone Encounter (Signed)
Last office visit 03/22/2018 to Establish Care. CPE scheduled for 12//12/2017.  Last refilled 04/19/2018 for #90 with no refills.  Ok to refill?

## 2018-05-22 ENCOUNTER — Other Ambulatory Visit: Payer: Self-pay | Admitting: Internal Medicine

## 2018-05-22 ENCOUNTER — Other Ambulatory Visit: Payer: Self-pay | Admitting: Family Medicine

## 2018-05-22 DIAGNOSIS — R000 Tachycardia, unspecified: Secondary | ICD-10-CM

## 2018-05-30 ENCOUNTER — Ambulatory Visit (INDEPENDENT_AMBULATORY_CARE_PROVIDER_SITE_OTHER)
Admission: RE | Admit: 2018-05-30 | Discharge: 2018-05-30 | Disposition: A | Discharge status: Home / Self Care | Payer: Medicare Other | Source: Ambulatory Visit | Attending: Primary Care | Admitting: Primary Care

## 2018-05-30 ENCOUNTER — Telehealth: Payer: Self-pay

## 2018-05-30 ENCOUNTER — Ambulatory Visit (INDEPENDENT_AMBULATORY_CARE_PROVIDER_SITE_OTHER): Payer: Medicare Other | Admitting: Primary Care

## 2018-05-30 ENCOUNTER — Encounter: Payer: Self-pay | Admitting: Primary Care

## 2018-05-30 VITALS — BP 146/72 | HR 63 | Temp 97.5°F | Ht 63.0 in | Wt 137.5 lb

## 2018-05-30 DIAGNOSIS — R0789 Other chest pain: Secondary | ICD-10-CM

## 2018-05-30 DIAGNOSIS — M79631 Pain in right forearm: Secondary | ICD-10-CM | POA: Diagnosis not present

## 2018-05-30 DIAGNOSIS — M25562 Pain in left knee: Secondary | ICD-10-CM

## 2018-05-30 DIAGNOSIS — M79609 Pain in unspecified limb: Secondary | ICD-10-CM

## 2018-05-30 DIAGNOSIS — S2232XA Fracture of one rib, left side, initial encounter for closed fracture: Secondary | ICD-10-CM | POA: Diagnosis not present

## 2018-05-30 DIAGNOSIS — R296 Repeated falls: Secondary | ICD-10-CM | POA: Diagnosis not present

## 2018-05-30 DIAGNOSIS — M1711 Unilateral primary osteoarthritis, right knee: Secondary | ICD-10-CM | POA: Diagnosis not present

## 2018-05-30 NOTE — Telephone Encounter (Signed)
Pt fell last night in the kitchen; pt was using walker but lt leg gave out and pt fell and hit shoulder on dinette hutch. Caregiver is there now and can have pt at [facility] at 2:40 today with Gentry Fitz NP.

## 2018-05-30 NOTE — Patient Instructions (Signed)
Complete xray(s) prior to leaving today. I will notify you of your results once received.  Continue your currently prescribed medications for pain.   Remember to apply ice/heat to the areas for inflammation/pain. Be sure to stretch and move your joints to avoid further stiffness.  It was a pleasure meeting you!

## 2018-05-30 NOTE — Progress Notes (Signed)
Subjective:    Patient ID: Karen [name], female    DOB: 11-20-1923, 84 y.o.   MRN: [id number]  HPI  Ms. Hallinan is a 84 year old female with a history of hypertension, paroxysmal atrial fibrillation, hypothyroidism, diabetes, spinal stenosis of lumbar spine, tremor who presents today with a chief complaint of fall.  She got up from the dinner tablet in her home yesterday afternoon, was walking with her walker, made three steps when her left leg suddenly "gave out". She fell back against the hutch onto her left side hitting her left upper extremity and left lateral side, she tried to hold onto her walker and twisted her right arm.Marland Kitchen Her head "brushed" the hutch, she did not hit the floor with her head. She sat on the kitchen  floor for several hours until her son came home from work.   She reports pain to her left mid to lower back, left calf, left knee, right forearm. She's been taking her hydrocodone as prescribed. She's applying ice and heat to her left lower extremity. Her caregiver is with her today who reports that this is her second fall within one week.   Review of Systems  Musculoskeletal: Positive for arthralgias and back pain.  Skin: Positive for wound.  Neurological: Negative for dizziness and headaches.       Past Medical History:  Diagnosis Date  . A-fib ([place])   . Abnormal involuntary movements(781.0)   . Anemia   . Anxiety   . Anxiety state, unspecified   . Arthritis    body, neck  . Benign hypertensive heart disease   . Benign hypertensive heart disease with congestive heart failure ([place])   . Bladder disorder    over active  . Cancer ([place])    skin, removed  . Cervicalgia   . CHF (congestive heart failure) ([place])   . Chronic kidney disease, stage III (moderate) (HCC)   . Congestive heart failure, unspecified   . Contact dermatitis and other eczema, due to unspecified cause   . Corns and callosities   . Diabetes mellitus without complication ([place])   .  Diarrhea   . Dyspnea 02/18/2015  . Edema   . Esophageal stricture   . Gastroparesis   . Gastroparesis   . GERD (gastroesophageal reflux disease)   . Hiatal hernia   . Hyperlipidemia   . Inflamed seborrheic keratosis   . Inflammatory disease of breast   . Irregular heartbeat   . Lumbago   . Mixed incontinence urge and stress (female)(female)   . Nausea alone   . Nonspecific (abnormal) findings on radiological and other examination of skull and head   . Osteoarthrosis, unspecified whether generalized or localized, unspecified site   . Other and unspecified hyperlipidemia   . Other B-complex deficiencies   . Other functional disorder of bladder   . Other malaise and fatigue   . Other specified visual disturbances   . Pain in joint, lower leg   . Palpitations   . Peripheral vascular disease, unspecified ([place])   . Postmenopausal atrophic vaginitis   . Reflux esophagitis   . Seborrheic keratosis   . Sleep apnea   . Spasm of muscle   . Spinal stenosis   . Spinal stenosis, unspecified region other than cervical   . Stricture and stenosis of esophagus   . Stroke ([place])   . TIA (transient ischemic attack) 2012  . Transient ischemic attack (TIA), and cerebral infarction without residual deficits(V12.54)   . Type II  or unspecified type diabetes mellitus with renal manifestations, not stated as uncontrolled(250.40)   . Unspecified constipation   . Unspecified essential hypertension   . Unspecified hereditary and idiopathic peripheral neuropathy   . Unspecified hypothyroidism   . Unspecified pruritic disorder   . Unspecified sleep apnea   . Unspecified vitamin D deficiency   . Urinary tract infection, site not specified   . Vitamin B12 deficiency      Social History   Socioeconomic History  . Marital status: Widowed    Spouse name: Not on file  . Number of children: 3  . Years of education: Not on file  . Highest education level: Not on file  Occupational History  . Occupation:  retired  Scientific laboratory technician  . Financial resource strain: Not hard at all  . Food insecurity:    Worry: Never true    Inability: Never true  . Transportation needs:    Medical: No    Non-medical: No  Tobacco Use  . Smoking status: Never Smoker  . Smokeless tobacco: Never Used  Substance and Sexual Activity  . Alcohol use: No  . Drug use: No  . Sexual activity: Never  Lifestyle  . Physical activity:    Days per week: 0 days    Minutes per session: 0 min  . Stress: Rather much  Relationships  . Social connections:    Talks on phone: More than three times a week    Gets together: More than three times a week    Attends religious service: More than 4 times per year    Active member of club or organization: Yes    Attends meetings of clubs or organizations: Never    Relationship status: Widowed  . Intimate partner violence:    Fear of current or ex partner: No    Emotionally abused: No    Physically abused: No    Forced sexual activity: No  Other Topics Concern  . Not on file  Social History Narrative  . Not on file    Past Surgical History:  Procedure Laterality Date  . ABDOMINAL HYSTERECTOMY    . APPENDECTOMY    . BACK SURGERY     x 2  . CESAREAN SECTION    . CHOLECYSTECTOMY  1991  . esophageal  2004,2006   dilation, Dr Lyla Son  . [place]   bilateral cataract  . hammer toes    . HERNIA REPAIR  1984aaaaaaaa  . SKIN CANCER DESTRUCTION    . THYROIDECTOMY, PARTIAL  1965    Family History  Problem Relation Age of Onset  . Cancer Mother        ? stomach  . Heart disease Father   . Hyperlipidemia Father   . Hypertension Father   . Heart attack Father   . Heart disease Brother   . Kidney disease Daughter   . Heart disease Brother   . Cancer Son     Allergies  Allergen Reactions  . Celebrex [Celecoxib] Rash  . Cymbalta [Duloxetine Hcl] Rash    Lip numbness,rash, leg and body jerks. Patient states " I though I was having a heart attack or  stroke."   . Doxycycline Rash  . Lyrica [Pregabalin] Rash  . Avandia [Rosiglitazone]   . Macrodantin [Nitrofurantoin Macrocrystal]   . Sulfa Antibiotics   . Vioxx [Rofecoxib]   . Silicone Rash    Gets rash from the touch it.    Current Outpatient Medications on File Prior  to Visit  Medication Sig Dispense Refill  . ALPRAZolam (XANAX) 0.5 MG tablet TAKE 1 TABLET (0.5 MG TOTAL) BY MOUTH 3 (THREE) TIMES DAILY AS NEEDED. 90 tablet 0  . aspirin 325 MG EC tablet Take 325 mg by mouth daily.    Marland Kitchen CRANBERRY PO Take 300 mg by mouth daily.    . [facility] NATURAL LUTEIN EYE HEALTH PO Take by mouth. One tablet once daily for eyes    . diclofenac sodium (VOLTAREN) 1 % GEL Apply 4 g topically 4 (four) times daily. To bilateral knees 100 g 5  . digoxin (LANOXIN) 0.125 MG tablet TAKE ONE TABLET BY MOUTH ON MONDAY, WEDNESDAY, FRIDAY, AND SATURDAY. 16 tablet 13  . furosemide (LASIX) 40 MG tablet One daily to control edema 90 tablet 0  . gabapentin (NEURONTIN) 100 MG capsule TAKE TWO CAPSULES BY MOUTH AT BEDTIME TO HELP WITH TREMORS 60 capsule 0  . HYDROcodone-acetaminophen (NORCO) 7.5-325 MG tablet Take 1 tablet by mouth 4 (four) times daily. 120 tablet 0  . HYDROcodone-acetaminophen (NORCO) 7.5-325 MG tablet Take 1 tablet by mouth 4 (four) times daily. DO NOT FILL UNTIL 05/19/2018 120 tablet 0  . HYDROcodone-acetaminophen (NORCO) 7.5-325 MG tablet Take 1 tablet by mouth 4 (four) times daily. 120 tablet 0  . insulin lispro (HUMALOG KWIKPEN) 100 UNIT/ML KiwkPen Inj 10u before breakfast, 14u before lunch, 14u before supper. May use up to 3 more units/meal prn per sliding scale max units 45. Dx:E11.29 15 pen 3  . Insulin Pen Needle (B-D ULTRAFINE III SHORT PEN) 31G X 8 MM MISC Use with administering insulin. Dx. E11.29 100 each 11  . LANTUS SOLOSTAR 100 UNIT/ML Solostar Pen INJECT 42 UNITS INTO THE SKIN AT BEDTIME 14 pen 1  . levothyroxine (SYNTHROID, LEVOTHROID) 125 MCG tablet TAKE ONE TABLET BY MOUTH EVERY MORNING  30 MINUTES BEFORE BREAKFAST 30 tablet 3  . metoprolol tartrate (LOPRESSOR) 50 MG tablet TAKE 1 TABLET BY MOUTH TWICE A DAY TO CONTROL HEART RATE 180 tablet 1  . Multiple Vitamins-Minerals (PRESERVISION AREDS 2 PO) Take by mouth. 1 by mouth two times daily    . nitroGLYCERIN (NITROSTAT) 0.4 MG SL tablet Place 1 tablet (0.4 mg total) under the tongue every 5 (five) minutes as needed for chest pain. 25 tablet 12  . omeprazole (PRILOSEC) 40 MG capsule TAKE ONE (1) CAPSULE BY MOUTH 2 TIMES DAILY FOR STOMACH 180 capsule 1  . ondansetron (ZOFRAN) 4 MG tablet TAKE ONE TABLET BY MOUTH EVERY 4 HOURS AS NEEDED FOR NAUSEA/VOMITING 30 tablet 0  . ONE TOUCH ULTRA TEST test strip Use to test sugar three times daily dx E11.29    . Polyethyl Glycol-Propyl Glycol (SYSTANE OP) Apply to eye. One drop both eyes twice daily for dry eyes    . triamcinolone cream (KENALOG) 0.1 % Mix with CeraVe cream apply twice daily as directed    . trimethoprim (TRIMPEX) 100 MG tablet TAKE ONE TABLET BY MOUTH EVERY NIGHT AT BEDTIME TO PREVENT BLADDER INFECTION. 30 tablet 11  . Vitamin D, Ergocalciferol, (DRISDOL) 50000 units CAPS capsule TAKE ONE CAPSULE BY MOUTH ON THE SAME DAY EVERY WEEK AS DIRECTED 12 capsule 1  . VITAMIN E PO Take 400 Units by mouth daily.     No current facility-administered medications on file prior to visit.     BP (!) 146/72   Pulse 63   Temp (!) 97.5 F (36.4 C) (Oral)   Ht 5\' 3"  (1.6 m)   Wt 137 lb 8 oz (  62.4 kg)   SpO2 92%   BMI 24.36 kg/m    Objective:   Physical Exam  Constitutional: She is oriented to person, place, and time. She appears well-nourished.  Cardiovascular: Normal rate and regular rhythm.  Respiratory: Effort normal and breath sounds normal.    Tenderness to left lateral chest wall  Musculoskeletal:       Left elbow: She exhibits normal range of motion and no swelling. Tenderness found. Radial head tenderness noted.       Left knee: She exhibits decreased range of motion,  swelling and bony tenderness. She exhibits no erythema. Tenderness found.       Thoracic back: She exhibits tenderness and pain. She exhibits no swelling.       Back:       Right forearm: She exhibits tenderness, bony tenderness and swelling. She exhibits no deformity.       Arms: Ambulatory with walker  Neurological: She is alert and oriented to person, place, and time.  Skin: Skin is warm and dry.  Abrasion to left flank, left anterior knee, left anterior lower extremity           Assessment & Plan:  Fall:  Onto hutch landing on left side. Exam today with moderate tenderness to right forearm, left knee, and left lateral ribs. These seem to be the sites of most concern. Check plain films of all sites mentioned above. She is ambulatory with walker but does seem unsteady. Will send note to PCP, patient may benefit from home PT Continue current medications, discussed heat/ice, stretching.  Pleas Koch, NP

## 2018-05-30 NOTE — Telephone Encounter (Signed)
Patient evaluated. She did not show up until 2:49 pm and did not get back to exam room until 2:55 pm.

## 2018-05-31 ENCOUNTER — Telehealth: Payer: Self-pay | Admitting: Primary Care

## 2018-05-31 NOTE — Telephone Encounter (Signed)
Tried to call patient regarding x-ray results. Tried the cell number and it stated that the number is not available. Tried the home and it kept ringing.

## 2018-05-31 NOTE — Telephone Encounter (Signed)
Noted  

## 2018-05-31 NOTE — Telephone Encounter (Signed)
Pt returning your call. Please call pt.

## 2018-06-01 ENCOUNTER — Other Ambulatory Visit: Payer: Self-pay | Admitting: Primary Care

## 2018-06-01 ENCOUNTER — Ambulatory Visit (INDEPENDENT_AMBULATORY_CARE_PROVIDER_SITE_OTHER)
Admission: RE | Admit: 2018-06-01 | Discharge: 2018-06-01 | Disposition: A | Discharge status: Home / Self Care | Payer: Medicare Other | Source: Ambulatory Visit | Attending: Primary Care | Admitting: Primary Care

## 2018-06-01 DIAGNOSIS — R911 Solitary pulmonary nodule: Secondary | ICD-10-CM

## 2018-06-01 DIAGNOSIS — R296 Repeated falls: Secondary | ICD-10-CM | POA: Diagnosis not present

## 2018-06-01 NOTE — Telephone Encounter (Signed)
Spoken and notified patient of Kate Clark's comments. Patient verbalized understanding.  

## 2018-06-18 ENCOUNTER — Other Ambulatory Visit: Payer: Self-pay | Admitting: Family Medicine

## 2018-06-18 DIAGNOSIS — M170 Bilateral primary osteoarthritis of knee: Secondary | ICD-10-CM | POA: Diagnosis not present

## 2018-06-18 DIAGNOSIS — F419 Anxiety disorder, unspecified: Secondary | ICD-10-CM

## 2018-06-18 NOTE — Telephone Encounter (Signed)
Electronic refill request Alprazolam Last office visit 05/30/18 Last refill 05/18/18

## 2018-06-19 ENCOUNTER — Telehealth: Payer: Self-pay | Admitting: *Deleted

## 2018-06-19 ENCOUNTER — Other Ambulatory Visit: Payer: Self-pay | Admitting: Family Medicine

## 2018-06-19 DIAGNOSIS — F419 Anxiety disorder, unspecified: Secondary | ICD-10-CM

## 2018-06-19 MED ORDER — ALPRAZOLAM 0.25 MG PO TABS: 0.2500 mg | ORAL_TABLET | Freq: Three times a day (TID) | ORAL | 0 refills | Status: DC | PRN

## 2018-06-19 NOTE — Telephone Encounter (Signed)
[facility] Alp[facility]olam 0.5 mg is on backorder.  They are requesting a new Rx for either 0.25 mg or 1 mg.

## 2018-06-19 NOTE — Telephone Encounter (Signed)
New rx sent in

## 2018-06-23 DIAGNOSIS — E1140 Type 2 diabetes mellitus with diabetic neuropathy, unspecified: Secondary | ICD-10-CM | POA: Diagnosis not present

## 2018-06-23 DIAGNOSIS — E039 Hypothyroidism, unspecified: Secondary | ICD-10-CM | POA: Diagnosis not present

## 2018-06-23 DIAGNOSIS — E1122 Type 2 diabetes mellitus with diabetic chronic kidney disease: Secondary | ICD-10-CM | POA: Diagnosis not present

## 2018-06-23 DIAGNOSIS — G894 Chronic pain syndrome: Secondary | ICD-10-CM | POA: Diagnosis not present

## 2018-06-23 DIAGNOSIS — Z794 Long term (current) use of insulin: Secondary | ICD-10-CM | POA: Diagnosis not present

## 2018-06-23 DIAGNOSIS — Z9181 History of falling: Secondary | ICD-10-CM | POA: Diagnosis not present

## 2018-06-23 DIAGNOSIS — N183 Chronic kidney disease, stage 3 (moderate): Secondary | ICD-10-CM | POA: Diagnosis not present

## 2018-06-23 DIAGNOSIS — K219 Gastro-esophageal reflux disease without esophagitis: Secondary | ICD-10-CM | POA: Diagnosis not present

## 2018-06-23 DIAGNOSIS — I2510 Atherosclerotic heart disease of native coronary artery without angina pectoris: Secondary | ICD-10-CM | POA: Diagnosis not present

## 2018-06-23 DIAGNOSIS — Z7982 Long term (current) use of aspirin: Secondary | ICD-10-CM | POA: Diagnosis not present

## 2018-06-23 DIAGNOSIS — G4730 Sleep apnea, unspecified: Secondary | ICD-10-CM | POA: Diagnosis not present

## 2018-06-23 DIAGNOSIS — M170 Bilateral primary osteoarthritis of knee: Secondary | ICD-10-CM | POA: Diagnosis not present

## 2018-06-23 DIAGNOSIS — I129 Hypertensive chronic kidney disease with stage 1 through stage 4 chronic kidney disease, or unspecified chronic kidney disease: Secondary | ICD-10-CM | POA: Diagnosis not present

## 2018-06-23 DIAGNOSIS — I4891 Unspecified atrial fibrillation: Secondary | ICD-10-CM | POA: Diagnosis not present

## 2018-06-26 ENCOUNTER — Emergency Department (HOSPITAL_COMMUNITY): Payer: Medicare Other

## 2018-06-26 ENCOUNTER — Encounter (HOSPITAL_COMMUNITY): Payer: Self-pay | Admitting: Emergency Medicine

## 2018-06-26 ENCOUNTER — Ambulatory Visit: Payer: Self-pay

## 2018-06-26 ENCOUNTER — Other Ambulatory Visit: Payer: Self-pay | Admitting: Family Medicine

## 2018-06-26 ENCOUNTER — Telehealth: Payer: Self-pay | Admitting: Family Medicine

## 2018-06-26 ENCOUNTER — Other Ambulatory Visit: Payer: Self-pay

## 2018-06-26 ENCOUNTER — Emergency Department (HOSPITAL_COMMUNITY)
Admission: EM | Admit: 2018-06-26 | Discharge: 2018-06-27 | Disposition: A | Discharge status: Other Acute Inpatient Hospital | Payer: Medicare Other | Attending: Emergency Medicine | Admitting: Emergency Medicine

## 2018-06-26 DIAGNOSIS — E1140 Type 2 diabetes mellitus with diabetic neuropathy, unspecified: Secondary | ICD-10-CM

## 2018-06-26 DIAGNOSIS — S79912A Unspecified injury of left hip, initial encounter: Secondary | ICD-10-CM | POA: Diagnosis not present

## 2018-06-26 DIAGNOSIS — R52 Pain, unspecified: Secondary | ICD-10-CM | POA: Diagnosis not present

## 2018-06-26 DIAGNOSIS — R531 Weakness: Secondary | ICD-10-CM | POA: Insufficient documentation

## 2018-06-26 DIAGNOSIS — Z794 Long term (current) use of insulin: Secondary | ICD-10-CM | POA: Insufficient documentation

## 2018-06-26 DIAGNOSIS — Z85828 Personal history of other malignant neoplasm of skin: Secondary | ICD-10-CM | POA: Insufficient documentation

## 2018-06-26 DIAGNOSIS — E860 Dehydration: Secondary | ICD-10-CM | POA: Insufficient documentation

## 2018-06-26 DIAGNOSIS — N183 Chronic kidney disease, stage 3 (moderate): Secondary | ICD-10-CM | POA: Diagnosis not present

## 2018-06-26 DIAGNOSIS — M25562 Pain in left knee: Secondary | ICD-10-CM | POA: Diagnosis not present

## 2018-06-26 DIAGNOSIS — E039 Hypothyroidism, unspecified: Secondary | ICD-10-CM | POA: Insufficient documentation

## 2018-06-26 DIAGNOSIS — I509 Heart failure, unspecified: Secondary | ICD-10-CM | POA: Diagnosis not present

## 2018-06-26 DIAGNOSIS — Z79899 Other long term (current) drug therapy: Secondary | ICD-10-CM | POA: Diagnosis not present

## 2018-06-26 DIAGNOSIS — W19XXXA Unspecified fall, initial encounter: Secondary | ICD-10-CM

## 2018-06-26 DIAGNOSIS — E1165 Type 2 diabetes mellitus with hyperglycemia: Secondary | ICD-10-CM | POA: Diagnosis not present

## 2018-06-26 DIAGNOSIS — Z8673 Personal history of transient ischemic attack (TIA), and cerebral infarction without residual deficits: Secondary | ICD-10-CM | POA: Insufficient documentation

## 2018-06-26 DIAGNOSIS — Z7982 Long term (current) use of aspirin: Secondary | ICD-10-CM | POA: Diagnosis not present

## 2018-06-26 DIAGNOSIS — S0990XA Unspecified injury of head, initial encounter: Secondary | ICD-10-CM | POA: Diagnosis not present

## 2018-06-26 DIAGNOSIS — S3992XA Unspecified injury of lower back, initial encounter: Secondary | ICD-10-CM | POA: Diagnosis not present

## 2018-06-26 DIAGNOSIS — I10 Essential (primary) hypertension: Secondary | ICD-10-CM | POA: Diagnosis not present

## 2018-06-26 DIAGNOSIS — I130 Hypertensive heart and chronic kidney disease with heart failure and stage 1 through stage 4 chronic kidney disease, or unspecified chronic kidney disease: Secondary | ICD-10-CM | POA: Insufficient documentation

## 2018-06-26 DIAGNOSIS — S8992XA Unspecified injury of left lower leg, initial encounter: Secondary | ICD-10-CM | POA: Diagnosis not present

## 2018-06-26 DIAGNOSIS — S199XXA Unspecified injury of neck, initial encounter: Secondary | ICD-10-CM | POA: Diagnosis not present

## 2018-06-26 DIAGNOSIS — R001 Bradycardia, unspecified: Secondary | ICD-10-CM | POA: Diagnosis not present

## 2018-06-26 DIAGNOSIS — E1122 Type 2 diabetes mellitus with diabetic chronic kidney disease: Secondary | ICD-10-CM | POA: Insufficient documentation

## 2018-06-26 DIAGNOSIS — S299XXA Unspecified injury of thorax, initial encounter: Secondary | ICD-10-CM | POA: Diagnosis not present

## 2018-06-26 DIAGNOSIS — I959 Hypotension, unspecified: Secondary | ICD-10-CM | POA: Diagnosis not present

## 2018-06-26 DIAGNOSIS — Z743 Need for continuous supervision: Secondary | ICD-10-CM | POA: Diagnosis not present

## 2018-06-26 DIAGNOSIS — M25552 Pain in left hip: Secondary | ICD-10-CM | POA: Diagnosis not present

## 2018-06-26 DIAGNOSIS — M549 Dorsalgia, unspecified: Secondary | ICD-10-CM | POA: Diagnosis not present

## 2018-06-26 LAB — COMPREHENSIVE METABOLIC PANEL
ALT: 23 U/L (ref 0–44)
ANION GAP: 7 (ref 5–15)
AST: 28 U/L (ref 15–41)
Albumin: 3.5 g/dL (ref 3.5–5.0)
Alkaline Phosphatase: 98 U/L (ref 38–126)
BUN: 36 mg/dL — ABNORMAL HIGH (ref 8–23)
CHLORIDE: 103 mmol/L (ref 98–111)
CO2: 29 mmol/L (ref 22–32)
Calcium: 8.5 mg/dL — ABNORMAL LOW (ref 8.9–10.3)
Creatinine, Ser: 1.82 mg/dL — ABNORMAL HIGH (ref 0.44–1.00)
GFR calc non Af Amer: 24 mL/min — ABNORMAL LOW (ref >60)
GFR, EST AFRICAN AMERICAN: 27 mL/min — AB (ref >60)
GLUCOSE: 282 mg/dL — AB (ref 70–99)
Potassium: 5.2 mmol/L — ABNORMAL HIGH (ref 3.5–5.1)
SODIUM: 139 mmol/L (ref 135–145)
Total Bilirubin: 0.5 mg/dL (ref 0.3–1.2)
Total Protein: 6.2 g/dL — ABNORMAL LOW (ref 6.5–8.1)

## 2018-06-26 LAB — URINALYSIS, ROUTINE W REFLEX MICROSCOPIC
BILIRUBIN URINE: NEGATIVE
Glucose, UA: 50 mg/dL — AB
HGB URINE DIPSTICK: NEGATIVE
Ketones, ur: NEGATIVE mg/dL
NITRITE: NEGATIVE
PROTEIN: NEGATIVE mg/dL
SPECIFIC GRAVITY, URINE: 1.009 (ref 1.005–1.030)
pH: 5 (ref 5.0–8.0)

## 2018-06-26 LAB — CBC WITH DIFFERENTIAL/PLATELET
ABS IMMATURE GRANULOCYTES: 0.04 10*3/uL (ref 0.00–0.07)
BASOS ABS: 0 10*3/uL (ref 0.0–0.1)
Basophils Relative: 0 %
Eosinophils Absolute: 0.3 10*3/uL (ref 0.0–0.5)
Eosinophils Relative: 3 %
HEMATOCRIT: 37.2 % (ref 36.0–46.0)
HEMOGLOBIN: 10.8 g/dL — AB (ref 12.0–15.0)
IMMATURE GRANULOCYTES: 0 %
LYMPHS ABS: 2 10*3/uL (ref 0.7–4.0)
LYMPHS PCT: 20 %
MCH: 30.2 pg (ref 26.0–34.0)
MCHC: 29 g/dL — ABNORMAL LOW (ref 30.0–36.0)
MCV: 103.9 fL — ABNORMAL HIGH (ref 80.0–100.0)
MONOS PCT: 9 %
Monocytes Absolute: 0.9 10*3/uL (ref 0.1–1.0)
NEUTROS PCT: 68 %
NRBC: 0 % (ref 0.0–0.2)
Neutro Abs: 6.9 10*3/uL (ref 1.7–7.7)
Platelets: 209 10*3/uL (ref 150–400)
RBC: 3.58 MIL/uL — ABNORMAL LOW (ref 3.87–5.11)
RDW: 12.5 % (ref 11.5–15.5)
WBC: 10.2 10*3/uL (ref 4.0–10.5)

## 2018-06-26 LAB — I-STAT TROPONIN, ED: TROPONIN I, POC: 0 ng/mL (ref 0.00–0.08)

## 2018-06-26 LAB — POC OCCULT BLOOD, ED: FECAL OCCULT BLD: NEGATIVE

## 2018-06-26 LAB — DIGOXIN LEVEL: Digoxin Level: 0.6 ng/mL — ABNORMAL LOW (ref 0.8–2.0)

## 2018-06-26 MED ORDER — LUTEIN 20 MG PO CAPS: ORAL_CAPSULE | Freq: Every day | ORAL | Status: DC

## 2018-06-26 MED ORDER — TRIMETHOPRIM 100 MG PO TABS
100.0000 mg | ORAL_TABLET | Freq: Every day | ORAL | Status: DC
  Administered 2018-06-26: 100 mg via ORAL
  Filled 2018-06-26 (×2): qty 1

## 2018-06-26 MED ORDER — LEVOTHYROXINE SODIUM 125 MCG PO TABS
125.0000 ug | ORAL_TABLET | Freq: Every day | ORAL | Status: DC
  Administered 2018-06-27: 125 ug via ORAL
  Filled 2018-06-26: qty 1

## 2018-06-26 MED ORDER — PANTOPRAZOLE SODIUM 40 MG PO TBEC
40.0000 mg | DELAYED_RELEASE_TABLET | Freq: Every day | ORAL | Status: DC
  Administered 2018-06-26 – 2018-06-27 (×2): 40 mg via ORAL
  Filled 2018-06-26 (×2): qty 1

## 2018-06-26 MED ORDER — INSULIN ASPART 100 UNIT/ML ~~LOC~~ SOLN: 10.0000 [IU] | Freq: Every day | SUBCUTANEOUS | Status: DC

## 2018-06-26 MED ORDER — INSULIN GLARGINE 100 UNIT/ML ~~LOC~~ SOLN
42.0000 [IU] | Freq: Every day | SUBCUTANEOUS | Status: DC
  Administered 2018-06-26: 42 [IU] via SUBCUTANEOUS
  Filled 2018-06-26 (×2): qty 0.42

## 2018-06-26 MED ORDER — VITAMIN D (ERGOCALCIFEROL) 1.25 MG (50000 UNIT) PO CAPS: 50000.0000 [IU] | ORAL_CAPSULE | ORAL | Status: DC

## 2018-06-26 MED ORDER — PRESERVISION AREDS 2 PO CAPS: ORAL_CAPSULE | Freq: Two times a day (BID) | ORAL | Status: DC

## 2018-06-26 MED ORDER — GABAPENTIN 100 MG PO CAPS
200.0000 mg | ORAL_CAPSULE | Freq: Every day | ORAL | Status: DC
  Administered 2018-06-26: 200 mg via ORAL
  Filled 2018-06-26: qty 2

## 2018-06-26 MED ORDER — ASPIRIN EC 325 MG PO TBEC
325.0000 mg | DELAYED_RELEASE_TABLET | Freq: Every day | ORAL | Status: DC
  Administered 2018-06-26 – 2018-06-27 (×2): 325 mg via ORAL
  Filled 2018-06-26 (×2): qty 1

## 2018-06-26 MED ORDER — DIGOXIN 125 MCG PO TABS: 0.1250 mg | ORAL_TABLET | ORAL | Status: DC

## 2018-06-26 MED ORDER — INSULIN ASPART 100 UNIT/ML ~~LOC~~ SOLN: 14.0000 [IU] | Freq: Two times a day (BID) | SUBCUTANEOUS | Status: DC

## 2018-06-26 MED ORDER — INSULIN GLARGINE 100 UNIT/ML SOLOSTAR PEN: 42.0000 [IU] | PEN_INJECTOR | Freq: Every day | SUBCUTANEOUS | Status: DC

## 2018-06-26 MED ORDER — OCUVITE-LUTEIN PO CAPS
1.0000 | ORAL_CAPSULE | Freq: Every day | ORAL | Status: DC
  Filled 2018-06-26: qty 1

## 2018-06-26 MED ORDER — ALPRAZOLAM 0.5 MG PO TABS
0.5000 mg | ORAL_TABLET | Freq: Three times a day (TID) | ORAL | Status: DC | PRN
  Administered 2018-06-26 – 2018-06-27 (×2): 0.5 mg via ORAL
  Filled 2018-06-26 (×2): qty 1

## 2018-06-26 MED ORDER — ARTIFICIAL TEARS OPHTHALMIC OINT
TOPICAL_OINTMENT | Freq: Two times a day (BID) | OPHTHALMIC | Status: DC
  Administered 2018-06-26 – 2018-06-27 (×2): via OPHTHALMIC
  Filled 2018-06-26: qty 3.5

## 2018-06-26 MED ORDER — SODIUM CHLORIDE 0.9 % IV BOLUS
500.0000 mL | Freq: Once | INTRAVENOUS | Status: AC
  Administered 2018-06-26: 500 mL via INTRAVENOUS

## 2018-06-26 MED ORDER — INSULIN LISPRO 100 UNIT/ML (KWIKPEN): 10.0000 [IU] | PEN_INJECTOR | SUBCUTANEOUS | Status: DC

## 2018-06-26 MED ORDER — POLYETHYL GLYCOL-PROPYL GLYCOL 0.4-0.3 % OP GEL: Freq: Two times a day (BID) | OPHTHALMIC | Status: DC

## 2018-06-26 MED ORDER — FUROSEMIDE 20 MG PO TABS
40.0000 mg | ORAL_TABLET | Freq: Every day | ORAL | Status: DC
  Administered 2018-06-27: 40 mg via ORAL
  Filled 2018-06-26 (×2): qty 2

## 2018-06-26 MED ORDER — VITAMIN E 180 MG (400 UNIT) PO CAPS
400.0000 [IU] | ORAL_CAPSULE | Freq: Every day | ORAL | Status: DC
  Administered 2018-06-27: 400 [IU] via ORAL
  Filled 2018-06-26 (×2): qty 1

## 2018-06-26 MED ORDER — METOPROLOL TARTRATE 25 MG PO TABS
50.0000 mg | ORAL_TABLET | Freq: Two times a day (BID) | ORAL | Status: DC
  Administered 2018-06-26: 50 mg via ORAL
  Filled 2018-06-26 (×2): qty 2

## 2018-06-26 MED ORDER — DICLOFENAC SODIUM 1 % TD GEL
4.0000 g | Freq: Four times a day (QID) | TRANSDERMAL | Status: DC
  Administered 2018-06-26 – 2018-06-27 (×3): 4 g via TOPICAL
  Filled 2018-06-26: qty 100

## 2018-06-26 MED ORDER — HYDROCODONE-ACETAMINOPHEN 7.5-325 MG PO TABS
1.0000 | ORAL_TABLET | Freq: Four times a day (QID) | ORAL | Status: DC
  Administered 2018-06-26 – 2018-06-27 (×3): 1 via ORAL
  Filled 2018-06-26 (×4): qty 1

## 2018-06-26 MED ORDER — CRANBERRY 300 MG PO TABS: 300.0000 mg | ORAL_TABLET | Freq: Every day | ORAL | Status: DC

## 2018-06-26 NOTE — ED Notes (Signed)
Urine culture sent with UA 

## 2018-06-26 NOTE — ED Triage Notes (Signed)
PER [facility], pt picked up from up due to generalized weakness x2weeks, hx of frequent falls, and low heart rate.  Denies any LOC.  Hx of a-fib.

## 2018-06-26 NOTE — Progress Notes (Addendum)
3:48pm-CSW spoke with [name] from [facility] and was informed that they do have short term rehab bed. CSW informed [name] that CSW has sent over information via hub to see if she can take pt. CSW awaiting PT note to send further information over to began authorization with [facility].   CSW has updated MD, RN, family and pt at bedside. At this time authorzation has not been started as PT note is needed. Evening ED CSW to follow up with [name] at [place] once all information has been submitted to [place] to start authorization.  CSW consulted as pt is in need of SNF placement. CSW spoke with pt and pt's family at bedside. CSW was informed by pt that pt is from home alone. Pt reports that pt is wanting to go back home and get services this way. Daughter at  Bedside spoke with pt to try and convince pt to go to a facility and pt is still addiment about returning home.   CSW spoke with pt and family about the needs for Medicaid as from report pt is custodial care at this time. From CSW's knowledge [employer] will not pay for this. CSW has spoken with family about possible out of pocket as well as going ahead and applying for medicaid. CSW to follow up with pt once PT has seen and assessed pt for further needs.    Virgie Dad Airi Copado, MSW, [facility] Clinical Social Worker [phone]

## 2018-06-26 NOTE — Discharge Planning (Signed)
[facility] notes consult for SNF placement; referred to EDSW and ordered PT.

## 2018-06-26 NOTE — ED Notes (Signed)
Physical therapy bedside.

## 2018-06-26 NOTE — ED Notes (Signed)
Ordered food tray

## 2018-06-26 NOTE — ED Provider Notes (Signed)
[facility] EMERGENCY DEPARTMENT Provider Note   CSN: [id number] Arrival date & time: 06/26/18  5027     History   Chief Complaint Chief Complaint  Patient presents with  . Weakness  . Bradycardia    HPI Karen [name] is a 84 y.o. female.  84 year old female brought in by EMS from home for progressively worsening generalized weakness.  Patient lives at home alone, she reports decline in her ability to take care of herself over the past year, daughter at bedside states that patient has needed significantly more help from her neighbors lately with preparing meals and ambulate in patient.  Patient's daughter arrived at the house today with plan to take her to schedule PCP appointment, daughter had bought a wheelchair from the church in order to be able to transport the patient however the patient was too weak and 911 was called.  Patient reports foul odor to her urine, reports dark appearing stools, headache and diffuse body aches.  Patient has had several falls, her last fall was approximately 2 weeks ago when she fell into the dresser injuring her right lower ribs.  Patient reports pain in her right hip.  Patient has a history of insulin-dependent diabetes, has not taken her medications today as she has no appetite and has not been eating well, blood sugar in 300s with EMS.  Also reports taking digoxin, and history of A. fib, not on a blood thinner, no history of prior GI bleed.  Denies chest pain or difficulty breathing, changes in speech or vision.  No other complaints or concerns.     Past Medical History:  Diagnosis Date  . A-fib ([place])   . Abnormal involuntary movements(781.0)   . Anemia   . Anxiety   . Anxiety state, unspecified   . Arthritis    body, neck  . Benign hypertensive heart disease   . Benign hypertensive heart disease with congestive heart failure ([place])   . Bladder disorder    over active  . Cancer ([place])    skin, removed  . Cervicalgia    . CHF (congestive heart failure) ([place])   . Chronic kidney disease, stage III (moderate) (HCC)   . Congestive heart failure, unspecified   . Contact dermatitis and other eczema, due to unspecified cause   . Corns and callosities   . Diabetes mellitus without complication ([place])   . Diarrhea   . Dyspnea 02/18/2015  . Edema   . Esophageal stricture   . Gastroparesis   . Gastroparesis   . GERD (gastroesophageal reflux disease)   . Hiatal hernia   . Hyperlipidemia   . Inflamed seborrheic keratosis   . Inflammatory disease of breast   . Irregular heartbeat   . Lumbago   . Mixed incontinence urge and stress (female)(female)   . Nausea alone   . Nonspecific (abnormal) findings on radiological and other examination of skull and head   . Osteoarthrosis, unspecified whether generalized or localized, unspecified site   . Other and unspecified hyperlipidemia   . Other B-complex deficiencies   . Other functional disorder of bladder   . Other malaise and fatigue   . Other specified visual disturbances   . Pain in joint, lower leg   . Palpitations   . Peripheral vascular disease, unspecified ([place])   . Postmenopausal atrophic vaginitis   . Reflux esophagitis   . Seborrheic keratosis   . Sleep apnea   . Spasm of muscle   . Spinal stenosis   .  Spinal stenosis, unspecified region other than cervical   . Stricture and stenosis of esophagus   . Stroke ([place])   . TIA (transient ischemic attack) 2012  . Transient ischemic attack (TIA), and cerebral infarction without residual deficits(V12.54)   . Type II or unspecified type diabetes mellitus with renal manifestations, not stated as uncontrolled(250.40)   . Unspecified constipation   . Unspecified essential hypertension   . Unspecified hereditary and idiopathic peripheral neuropathy   . Unspecified hypothyroidism   . Unspecified pruritic disorder   . Unspecified sleep apnea   . Unspecified vitamin D deficiency   . Urinary tract infection, site  not specified   . Vitamin B12 deficiency     Patient Active Problem List   Diagnosis Date Noted  . Chronic pain syndrome 04/14/2018  . Neuropathy due to secondary diabetes ([place]) 03/22/2018  . History of TIA (transient ischemic attack) 03/22/2018  . Paroxysmal A-fib ([place]) 04/03/2017  . Bilateral primary osteoarthritis of knee 04/03/2017  . Venous insufficiency of both lower extremities 03/06/2017  . Controlled type 2 diabetes with neuropathy ([place]) 03/06/2017  . Hyperlipidemia   . Tachycardia 12/24/2014  . Obstructive sleep apnea 05/20/2014  . Chronic kidney disease, stage III (moderate) ([place]) 05/15/2013  . Osteoarthritis of both knees 05/15/2013  . Tremor 05/15/2013  . Varicose veins of lower extremities with other complications 17/61/6073  . Essential hypertension 12/17/2012  . Pruritic disorder 12/17/2012  . Spinal stenosis of lumbar region 12/17/2012  . Acid reflux   . GAD (generalized anxiety disorder)   . Hypothyroidism   . Dysphagia 10/09/2012    Past Surgical History:  Procedure Laterality Date  . ABDOMINAL HYSTERECTOMY    . APPENDECTOMY    . BACK SURGERY     x 2  . CESAREAN SECTION    . CHOLECYSTECTOMY  1991  . esophageal  2004,2006   dilation, Dr Lyla Son  . [place]eds   bilateral cataract  . hammer toes    . HERNIA REPAIR  1984aaaaaaaa  . SKIN CANCER DESTRUCTION    . THYROIDECTOMY, PARTIAL  1965     OB History   None      Home Medications    Prior to Admission medications   Medication Sig Start Date End Date Taking? Authorizing Provider  ALPRAZolam Duanne Moron) 1 MG tablet Take 0.5 tablets (0.5 mg total) by mouth 3 (three) times daily as needed for anxiety. Patient taking differently: Take 0.5 mg by mouth 3 (three) times daily.  06/19/18  Yes Bedsole, Amy E, MD  aspirin 325 MG EC tablet Take 325 mg by mouth daily.   Yes [provider]  CRANBERRY PO Take 300 mg by mouth daily.   Yes [provider]  [facility] NATURAL LUTEIN  EYE HEALTH PO Take 1 tablet by mouth daily.    Yes [provider]  diclofenac sodium (VOLTAREN) 1 % GEL Apply 4 g topically 4 (four) times daily. To bilateral knees 03/27/17  Yes Reed, Tiffany L, DO  digoxin (LANOXIN) 0.125 MG tablet TAKE ONE TABLET BY MOUTH ON MONDAY, WEDNESDAY, FRIDAY, AND SATURDAY. Patient taking differently: TAKE ONE TABLET BY MOUTH ON MONDAY, WEDNESDAY, FRIDAY, AND SATURDAY. 04/16/18  Yes Gildardo Cranker, DO  furosemide (LASIX) 40 MG tablet One daily to control edema Patient taking differently: Take 40 mg by mouth daily. One daily to control edema 04/10/18  Yes Bedsole, Amy E, MD  gabapentin (NEURONTIN) 100 MG capsule TAKE TWO CAPSULES BY MOUTH AT BEDTIME TO HELP WITH  TREMORS Patient taking differently: Take 200 mg by mouth at bedtime.  03/05/18  Yes Gildardo Cranker, DO  HYDROcodone-acetaminophen (NORCO) 7.5-325 MG tablet Take 1 tablet by mouth 4 (four) times daily. 04/19/18  Yes Bedsole, Amy E, MD  insulin lispro (HUMALOG KWIKPEN) 100 UNIT/ML KiwkPen Inj 10u before breakfast, 14u before lunch, 14u before supper. May use up to 3 more units/meal prn per sliding scale max units 45. Dx:E11.29 Patient taking differently: Inject 10-14 Units into the skin See admin instructions. Inj 10u before breakfast, 14u before lunch, 14u before supper. May use up to 3 more units/meal prn per sliding scale max units 45. Dx:E11.29 01/08/18  Yes Carter, Monica, DO  LANTUS SOLOSTAR 100 UNIT/ML Solostar Pen INJECT 42 UNITS INTO THE SKIN AT BEDTIME Patient taking differently: Inject 42 Units into the skin at bedtime.  01/01/18  Yes Eulas Post, Brayton Layman, DO  levothyroxine (SYNTHROID, LEVOTHROID) 125 MCG tablet TAKE ONE TABLET BY MOUTH EVERY MORNING 30 MINUTES BEFORE BREAKFAST Patient taking differently: Take 125 mcg by mouth daily before breakfast.  01/15/18  Yes Eulas Post, Monica, DO  metoprolol tartrate (LOPRESSOR) 50 MG tablet TAKE 1 TABLET BY MOUTH TWICE A DAY TO CONTROL HEART RATE Patient taking differently:  Take 50 mg by mouth 2 (two) times daily.  05/22/18  Yes Bedsole, Amy E, MD  Multiple Vitamins-Minerals (PRESERVISION AREDS 2 PO) Take 1 tablet by mouth 2 (two) times daily.    Yes [provider]  nitroGLYCERIN (NITROSTAT) 0.4 MG SL tablet Place 1 tablet (0.4 mg total) under the tongue every 5 (five) minutes as needed for chest pain. 09/20/16  Yes Estill Dooms, MD  omeprazole (PRILOSEC) 40 MG capsule TAKE ONE (1) CAPSULE BY MOUTH 2 TIMES DAILY FOR STOMACH Patient taking differently: Take 40 mg by mouth 2 (two) times daily.  11/21/17  Yes Carter, Monica, DO  ondansetron (ZOFRAN) 4 MG tablet TAKE ONE TABLET BY MOUTH EVERY 4 HOURS AS NEEDED FOR NAUSEA/VOMITING Patient taking differently: Take 4 mg by mouth every 8 (eight) hours as needed.  04/19/17  Yes Reed, Tiffany L, DO  Polyethyl Glycol-Propyl Glycol (SYSTANE OP) Place 1 drop into both eyes 2 (two) times daily.    Yes [provider]  triamcinolone cream (KENALOG) 0.1 % Apply 1 application topically as needed. Mix with CeraVe cream apply twice daily for dry skin 07/21/17  Yes [provider]  trimethoprim (TRIMPEX) 100 MG tablet TAKE ONE TABLET BY MOUTH EVERY NIGHT AT BEDTIME TO PREVENT BLADDER INFECTION. Patient taking differently: Take 100 mg by mouth at bedtime.  04/05/18  Yes Bedsole, Amy E, MD  Vitamin D, Ergocalciferol, (DRISDOL) 50000 units CAPS capsule TAKE ONE CAPSULE BY MOUTH ON THE SAME DAY EVERY WEEK AS DIRECTED Patient taking differently: Take 50,000 Units by mouth every 7 (seven) days.  03/19/18  Yes Gildardo Cranker, DO  VITAMIN E PO Take 400 Units by mouth daily.   Yes [provider]  HYDROcodone-acetaminophen (NORCO) 7.5-325 MG tablet Take 1 tablet by mouth 4 (four) times daily. DO NOT FILL UNTIL 05/19/2018 Patient not taking: Reported on 06/26/2018 04/19/18   Jinny Sanders, MD  HYDROcodone-acetaminophen (NORCO) 7.5-325 MG tablet Take 1 tablet by mouth 4 (four) times daily. Patient not taking:  Reported on 06/26/2018 04/19/18   Jinny Sanders, MD  Insulin Pen Needle (B-D ULTRAFINE III SHORT PEN) 31G X 8 MM MISC Use with administering insulin. Dx. E11.29 12/13/17   Gildardo Cranker, DO  ONE TOUCH ULTRA TEST test strip Use to test sugar  three times daily dx E11.29 08/09/16   [provider]    Family History Family History  Problem Relation Age of Onset  . Cancer Mother        ? stomach  . Heart disease Father   . Hyperlipidemia Father   . Hypertension Father   . Heart attack Father   . Heart disease Brother   . Kidney disease Daughter   . Heart disease Brother   . Cancer Son     Social History Social History   Tobacco Use  . Smoking status: Never Smoker  . Smokeless tobacco: Never Used  Substance Use Topics  . Alcohol use: No  . Drug use: No     Allergies   Celebrex [celecoxib]; Cymbalta [duloxetine hcl]; Doxycycline; Lyrica [pregabalin]; Avandia [rosiglitazone]; Macrodantin [nitrofurantoin macrocrystal]; Sulfa antibiotics; Vioxx [rofecoxib]; and Silicone   Review of Systems Review of Systems  Constitutional: Positive for activity change, appetite change and fatigue. Negative for fever.  Eyes: Negative for visual disturbance.  Respiratory: Negative for shortness of breath.   Cardiovascular: Negative for chest pain.  Gastrointestinal: Positive for constipation. Negative for abdominal distention, abdominal pain, diarrhea, nausea and vomiting.  Genitourinary: Negative for dysuria and frequency.  Musculoskeletal: Positive for arthralgias, back pain, gait problem and myalgias. Negative for joint swelling and neck pain.  Skin: Negative for rash and wound.  Allergic/Immunologic: Positive for immunocompromised state.  Neurological: Positive for weakness and headaches. Negative for speech difficulty and numbness.  Hematological: Does not bruise/bleed easily.  Psychiatric/Behavioral: Negative for confusion.  All other systems reviewed and are  negative.    Physical Exam Updated Vital Signs BP (!) 159/53   Pulse (!) 52   Temp (!) 97.4 F (36.3 C) (Oral)   Resp 13   Ht 5\' 5"  (1.651 m)   Wt 61.2 kg   SpO2 96%   BMI 22.47 kg/m   Physical Exam  Constitutional: She is oriented to person, place, and time. No distress.  Pale, frail, appears weak/tired  HENT:  Head: Normocephalic and atraumatic.  Nose: Nose normal.  Mouth/Throat: Mucous membranes are dry. No oropharyngeal exudate.  Eyes: Pupils are equal, round, and reactive to light. EOM are normal.  Neck: Neck supple.  Cardiovascular: Normal heart sounds. An irregular rhythm present. Bradycardia present.  No murmur heard. Pulmonary/Chest: Effort normal and breath sounds normal. No respiratory distress.  Abdominal: Soft. She exhibits no distension. There is no tenderness.  Genitourinary:  Genitourinary Comments: RN present for exam, stool soft and brown  Musculoskeletal: She exhibits tenderness. She exhibits no deformity.       Right hip: She exhibits tenderness.       Thoracic back: She exhibits tenderness.       Lumbar back: She exhibits tenderness.       Legs: Generalized tenderness to back, no stepoffs or crepitus   Neurological: She is alert and oriented to person, place, and time. No cranial nerve deficit or sensory deficit. GCS eye subscore is 4. GCS verbal subscore is 5. GCS motor subscore is 6.  Strength in arms and legs symmetric, weak symmetrically, no pronator drift   Skin: Skin is warm and dry. She is not diaphoretic.  Psychiatric: She has a normal mood and affect. Her behavior is normal.  Nursing note and vitals reviewed.    ED Treatments / Results  Labs (all labs ordered are listed, but only abnormal results are displayed) Labs Reviewed  URINALYSIS, ROUTINE W REFLEX MICROSCOPIC - Abnormal; Notable for the following components:  Result Value   Color, Urine STRAW (*)    Glucose, UA 50 (*)    Leukocytes, UA SMALL (*)    Bacteria, UA RARE (*)     All other components within normal limits  COMPREHENSIVE METABOLIC PANEL - Abnormal; Notable for the following components:   Potassium 5.2 (*)    Glucose, Bld 282 (*)    BUN 36 (*)    Creatinine, Ser 1.82 (*)    Calcium 8.5 (*)    Total Protein 6.2 (*)    GFR calc non Af Amer 24 (*)    GFR calc Af Amer 27 (*)    All other components within normal limits  CBC WITH DIFFERENTIAL/PLATELET - Abnormal; Notable for the following components:   RBC 3.58 (*)    Hemoglobin 10.8 (*)    MCV 103.9 (*)    MCHC 29.0 (*)    All other components within normal limits  DIGOXIN LEVEL - Abnormal; Notable for the following components:   Digoxin Level 0.6 (*)    All other components within normal limits  I-STAT TROPONIN, ED  POC OCCULT BLOOD, ED    EKG EKG Interpretation  Date/Time:  Tuesday June 26 2018 09:22:54 EST Ventricular Rate:  54 PR Interval:    QRS Duration: 104 QT Interval:  423 QTC Calculation: 401 R Axis:   45 Text Interpretation:  Sinus rhythm Anterior infarct, old Baseline wander in lead(s) V3 new q 3 otherwise similar to prior 10/17 Confirmed by Aletta Edouard [phone]) on 06/26/2018 10:03:11 AM Also confirmed by Aletta Edouard [phone]), editor Philomena Doheny [phone])  on 06/26/2018 10:28:47 AM   Radiology Dg Chest 1 View  Result Date: 06/26/2018 CLINICAL DATA:  Fall. EXAM: CHEST  1 VIEW COMPARISON:  Chest x-ray dated June 01, 2018. FINDINGS: Stable cardiomegaly. Normal pulmonary vascularity. No focal consolidation, pleural effusion, or pneumothorax. No acute osseous abnormality. IMPRESSION: No active disease. Electronically Signed   By: Titus Dubin M.D.   On: 06/26/2018 10:56   Dg Thoracic Spine W/swimmers  Result Date: 06/26/2018 CLINICAL DATA:  Back pain after fall. EXAM: THORACIC SPINE - 3 VIEWS COMPARISON:  Chest x-ray dated June 01, 2018. FINDINGS: Twelve rib-bearing thoracic vertebral bodies. No acute fracture or subluxation. Vertebral body heights are  preserved. Alignment is normal. Diffuse idiopathic skeletal hyperostosis. Intervertebral disc spaces are maintained. Osteopenia. IMPRESSION: 1.  No acute osseous abnormality. Electronically Signed   By: Titus Dubin M.D.   On: 06/26/2018 10:58   Dg Lumbar Spine Complete  Result Date: 06/26/2018 CLINICAL DATA:  Fall. EXAM: LUMBAR SPINE - COMPLETE 4+ VIEW COMPARISON:  CT 01/20/2012. FINDINGS: Surgical clips right upper quadrant. Diffuse osteopenia. L3-L4 posterior interbody fusion. Hardware intact. Anatomic alignment. Diffuse multilevel severe degenerative change. Diffuse ankylosis consistent ankylosing spondylitis noted. No acute bony abnormality. Aortoiliac and visceral atherosclerotic vascular disease. IMPRESSION: 1. L3-L4 posterior and interbody fusion. Hardware intact. Anatomic alignment. 2. Diffuse osteopenia. Diffuse severe lumbar spine degenerative change. Diffuse thoracolumbar spine ankylosis consistent ankylosing spondylitis. Electronically Signed   By: Marcello Moores  Register   On: 06/26/2018 10:53   Ct Head Wo Contrast  Result Date: 06/26/2018 CLINICAL DATA:  Recent falls with head and neck injury, initial encounter EXAM: CT HEAD WITHOUT CONTRAST CT CERVICAL SPINE WITHOUT CONTRAST TECHNIQUE: Multidetector CT imaging of the head and cervical spine was performed following the standard protocol without intravenous contrast. Multiplanar CT image reconstructions of the cervical spine were also generated. COMPARISON:  12/26/2010 FINDINGS: CT HEAD FINDINGS Brain: Mild atrophic changes are noted commensurate  with the patient's given age. No findings to suggest acute hemorrhage, acute infarction or space-occupying mass lesion are noted. Basal ganglia calcifications are seen. Vascular: No hyperdense vessel or unexpected calcification. Skull: Normal. Negative for fracture or focal lesion. Sinuses/Orbits: No acute finding. Postsurgical changes are noted in the globes bilaterally. Other: None. CT CERVICAL SPINE  FINDINGS Alignment: Within normal limits. Skull base and vertebrae: 7 cervical segments are well visualized. Diffuse anterior flowing osteophytes are noted consistent with DISH. Some diffuse calcification of the posterior longitudinal ligament is noted as well. No evidence of acute fracture or acute facet abnormality is seen. Mild facet hypertrophic changes are noted. Multilevel neural foraminal narrowing and diffuse central canal stenosis is noted. Soft tissues and spinal canal: Surrounding soft tissues are within normal limits. Upper chest: Within normal limits. Other: None IMPRESSION: CT of the head: Chronic atrophic changes without acute intracranial abnormality. CT of the cervical spine: Changes consistent with DISH as well as confluent ossification of the posterior longitudinal ligament. No acute fracture or acute facet abnormality is noted. Electronically Signed   By: Inez Catalina M.D.   On: 06/26/2018 10:37   Ct Cervical Spine Wo Contrast  Result Date: 06/26/2018 CLINICAL DATA:  Recent falls with head and neck injury, initial encounter EXAM: CT HEAD WITHOUT CONTRAST CT CERVICAL SPINE WITHOUT CONTRAST TECHNIQUE: Multidetector CT imaging of the head and cervical spine was performed following the standard protocol without intravenous contrast. Multiplanar CT image reconstructions of the cervical spine were also generated. COMPARISON:  12/26/2010 FINDINGS: CT HEAD FINDINGS Brain: Mild atrophic changes are noted commensurate with the patient's given age. No findings to suggest acute hemorrhage, acute infarction or space-occupying mass lesion are noted. Basal ganglia calcifications are seen. Vascular: No hyperdense vessel or unexpected calcification. Skull: Normal. Negative for fracture or focal lesion. Sinuses/Orbits: No acute finding. Postsurgical changes are noted in the globes bilaterally. Other: None. CT CERVICAL SPINE FINDINGS Alignment: Within normal limits. Skull base and vertebrae: 7 cervical  segments are well visualized. Diffuse anterior flowing osteophytes are noted consistent with DISH. Some diffuse calcification of the posterior longitudinal ligament is noted as well. No evidence of acute fracture or acute facet abnormality is seen. Mild facet hypertrophic changes are noted. Multilevel neural foraminal narrowing and diffuse central canal stenosis is noted. Soft tissues and spinal canal: Surrounding soft tissues are within normal limits. Upper chest: Within normal limits. Other: None IMPRESSION: CT of the head: Chronic atrophic changes without acute intracranial abnormality. CT of the cervical spine: Changes consistent with DISH as well as confluent ossification of the posterior longitudinal ligament. No acute fracture or acute facet abnormality is noted. Electronically Signed   By: Inez Catalina M.D.   On: 06/26/2018 10:37   Dg Knee Complete 4 Views Left  Result Date: 06/26/2018 CLINICAL DATA:  Fall with knee pain.  Initial encounter. EXAM: LEFT KNEE - COMPLETE 4+ VIEW COMPARISON:  None. FINDINGS: No evidence of fracture, dislocation, or joint effusion. Prominent osteopenia. There is degenerative joint spurring and medial compartment narrowing. IMPRESSION: 1. No acute osseous finding. 2. Osteoarthritis, joint narrowing, and prominent osteopenia. Electronically Signed   By: Monte Fantasia M.D.   On: 06/26/2018 11:06   Dg Hip Unilat With Pelvis 2-3 Views Left  Result Date: 06/26/2018 CLINICAL DATA:  Left hip pain after fall. EXAM: DG HIP (WITH OR WITHOUT PELVIS) 2-3V LEFT COMPARISON:  Bilateral hip x-rays dated May 04, 2016. FINDINGS: No acute fracture or dislocation. Pubic symphysis and sacroiliac joints are intact. Mild bilateral hip  osteoarthritis is similar to prior study. Osteopenia. Soft tissues are unremarkable. IMPRESSION: No acute osseous abnormality. Electronically Signed   By: Titus Dubin M.D.   On: 06/26/2018 10:53    Procedures Procedures (including critical care  time)  Medications Ordered in ED Medications  sodium chloride 0.9 % bolus 500 mL (0 mLs Intravenous Stopped 06/26/18 1355)     Initial Impression / Assessment and Plan / ED Course  I have reviewed the triage vital signs and the nursing notes.  Pertinent labs & imaging results that were available during my care of the patient were reviewed by me and considered in my medical decision making (see chart for details).  Clinical Course as of Jun 26 1550  Tue Nov 26, 10730  8940 84 year old female who is usually fairly independent who is had multiple falls over the last month or 2 and 2 over the last week.  Daughter has noticed that she is sleeping more and possibly not taking her medicines and now is so unsteady that she does need to allow her to use the walker.  Patient is getting CAT scan and x-rays of various sore areas along with some screening labs urinalysis.   [MB]  2542 Reviewed results with patient and family member including her daughter, friend, son with patient's permission.  CT scan of the head and neck are without significant findings, x-rays completed are unremarkable.  When patient was in radiology she reported she did not have right hip pain in fact she had left hip pain and the x-ray order was changed at that time.  Patient's digoxin level is 0.6, she states she takes her this medication on Monday, Wednesday, Friday did take her medication yesterday.  CBC with mild anemia hemoglobin 10.8, Hemoccult negative, troponin negative, urinalysis negative for urinary tract infection, CMP with slightly elevated potassium at 5.2, glucose 282 entheses possibly related to recent steroid injection), slight increase in creatinine at currently 1.82, patient given IV fluids.  Patient is too weak to go home where she lives alone and be able to care for herself safely.  I discussed this with the patient and her family, patient is agreeable to either admission to the hospital or placement at rehab facility  with ultimate goal of getting her strength back and going back home, advised this would be determined at a later date.  Case management paged for consult.   [LM]  1512 Awaiting PT eval to see if patient can be dc to skilled nursing facility, if unable to go to SNF, patient will need admission for generalized weakness as she cannot go home safely.   [LM]    Clinical Course User Index [LM] Tacy Learn, PA-C [MB] Hayden Rasmussen, MD   Final Clinical Impressions(s) / ED Diagnoses   Final diagnoses:  Generalized weakness  Dehydration    ED Discharge Orders    None       Roque Lias 06/26/18 1551    Hayden Rasmussen, MD 06/26/18 Karl Bales

## 2018-06-26 NOTE — ED Provider Notes (Signed)
.  Patient taken in sign out from [place]. - Patient is weak. PT eval recommends  SNF placement. Awaiting authorization from her Insurance.   Margarita Mail, PA-C 06/26/18 2238    Lajean Saver, MD 06/27/18 [phone]

## 2018-06-26 NOTE — Discharge Planning (Signed)
EDCM spoke with Janae Sauce of [facility] regarding [place] services.  Should pt go home, please notify [facility] so they can set up home visit.

## 2018-06-26 NOTE — NC FL2 (Signed)
[place] LEVEL OF CARE SCREENING TOOL     IDENTIFICATION  Patient Name: Karen [name] Birthdate: 12-Jan-1924 Sex: female Admission Date (Current Location): 06/26/2018  [facility] and [place] Number:  Herbalist and Address:  [facility] [place], [place] [address] [place], [place] [address]      Provider Number: [id number]  Attending Physician Name and Address:  Hayden Rasmussen, MD  Relative Name and Phone Number:       Current Level of Care: Hospital Recommended Level of Care: [place] Prior Approval Number:    Date Approved/Denied:   PASRR Number:   [id number]   Discharge Plan: SNF    Current Diagnoses: Patient Active Problem List   Diagnosis Date Noted  . Chronic pain syndrome 04/14/2018  . Neuropathy due to secondary diabetes ([place]) 03/22/2018  . History of TIA (transient ischemic attack) 03/22/2018  . Paroxysmal A-fib ([place]) 04/03/2017  . Bilateral primary osteoarthritis of knee 04/03/2017  . Venous insufficiency of both lower extremities 03/06/2017  . Controlled type 2 diabetes with neuropathy ([place]) 03/06/2017  . Hyperlipidemia   . Tachycardia 12/24/2014  . Obstructive sleep apnea 05/20/2014  . Chronic kidney disease, stage III (moderate) ([place]) 05/15/2013  . Osteoarthritis of both knees 05/15/2013  . Tremor 05/15/2013  . Varicose veins of lower extremities with other complications 00/17/4944  . Essential hypertension 12/17/2012  . Pruritic disorder 12/17/2012  . Spinal stenosis of lumbar region 12/17/2012  . Acid reflux   . GAD (generalized anxiety disorder)   . Hypothyroidism   . Dysphagia 10/09/2012    Orientation RESPIRATION BLADDER Height & Weight     Self, Time, Situation, Place  Normal Continent Weight: 61.2 kg Height:  5\' 5"  (165.1 cm)  BEHAVIORAL SYMPTOMS/MOOD NEUROLOGICAL BOWEL NUTRITION STATUS      Continent Diet(please see AVS)  AMBULATORY STATUS COMMUNICATION OF NEEDS Skin    Extensive Assist Verbally Normal                       Personal Care Assistance Level of Assistance  Bathing, Feeding, Dressing Bathing Assistance: Maximum assistance Feeding assistance: Limited assistance Dressing Assistance: Maximum assistance     Functional Limitations Info  Sight, Hearing, Speech Sight Info: Adequate Hearing Info: Adequate Speech Info: Adequate    SPECIAL CARE FACTORS FREQUENCY  PT (By licensed PT), OT (By licensed OT)     PT Frequency: 5 times a week  OT Frequency: 5 times a week             Contractures Contractures Info: Not present    Additional Factors Info  Code Status, Allergies Code Status Info: not on file  Allergies Info: Celebrex Celecoxib, Cymbalta Duloxetine Hcl, Doxycycline, Lyrica Pregabalin, Avandia Rosiglitazone, Macrodantin Nitrofurantoin Macrocrystal, Sulfa Antibiotics, Vioxx Rofecoxib, Silicone           Current Medications (06/26/2018):  This is the current hospital active medication list No current facility-administered medications for this encounter.    Current Outpatient Medications  Medication Sig Dispense Refill  . ALPRAZolam (XANAX) 1 MG tablet Take 0.5 tablets (0.5 mg total) by mouth 3 (three) times daily as needed for anxiety. (Patient taking differently: Take 0.5 mg by mouth 3 (three) times daily. ) 45 tablet 0  . aspirin 325 MG EC tablet Take 325 mg by mouth daily.    Marland Kitchen CRANBERRY PO Take 300 mg by mouth daily.    . [facility]S NATURAL LUTEIN EYE HEALTH PO Take 1 tablet  by mouth daily.     . diclofenac sodium (VOLTAREN) 1 % GEL Apply 4 g topically 4 (four) times daily. To bilateral knees 100 g 5  . digoxin (LANOXIN) 0.125 MG tablet TAKE ONE TABLET BY MOUTH ON MONDAY, WEDNESDAY, FRIDAY, AND SATURDAY. (Patient taking differently: TAKE ONE TABLET BY MOUTH ON MONDAY, WEDNESDAY, FRIDAY, AND SATURDAY.) 16 tablet 13  . furosemide (LASIX) 40 MG tablet One daily to control edema (Patient taking differently: Take 40 mg by mouth  daily. One daily to control edema) 90 tablet 0  . gabapentin (NEURONTIN) 100 MG capsule TAKE TWO CAPSULES BY MOUTH AT BEDTIME TO HELP WITH TREMORS (Patient taking differently: Take 200 mg by mouth at bedtime. ) 60 capsule 0  . HYDROcodone-acetaminophen (NORCO) 7.5-325 MG tablet Take 1 tablet by mouth 4 (four) times daily. 120 tablet 0  . insulin lispro (HUMALOG KWIKPEN) 100 UNIT/ML KiwkPen Inj 10u before breakfast, 14u before lunch, 14u before supper. May use up to 3 more units/meal prn per sliding scale max units 45. Dx:E11.29 (Patient taking differently: Inject 10-14 Units into the skin See admin instructions. Inj 10u before breakfast, 14u before lunch, 14u before supper. May use up to 3 more units/meal prn per sliding scale max units 45. Dx:E11.29) 15 pen 3  . LANTUS SOLOSTAR 100 UNIT/ML Solostar Pen INJECT 42 UNITS INTO THE SKIN AT BEDTIME (Patient taking differently: Inject 42 Units into the skin at bedtime. ) 14 pen 1  . levothyroxine (SYNTHROID, LEVOTHROID) 125 MCG tablet TAKE ONE TABLET BY MOUTH EVERY MORNING 30 MINUTES BEFORE BREAKFAST (Patient taking differently: Take 125 mcg by mouth daily before breakfast. ) 30 tablet 3  . metoprolol tartrate (LOPRESSOR) 50 MG tablet TAKE 1 TABLET BY MOUTH TWICE A DAY TO CONTROL HEART RATE (Patient taking differently: Take 50 mg by mouth 2 (two) times daily. ) 180 tablet 1  . Multiple Vitamins-Minerals (PRESERVISION AREDS 2 PO) Take 1 tablet by mouth 2 (two) times daily.     . nitroGLYCERIN (NITROSTAT) 0.4 MG SL tablet Place 1 tablet (0.4 mg total) under the tongue every 5 (five) minutes as needed for chest pain. 25 tablet 12  . omeprazole (PRILOSEC) 40 MG capsule TAKE ONE (1) CAPSULE BY MOUTH 2 TIMES DAILY FOR STOMACH (Patient taking differently: Take 40 mg by mouth 2 (two) times daily. ) 180 capsule 1  . ondansetron (ZOFRAN) 4 MG tablet TAKE ONE TABLET BY MOUTH EVERY 4 HOURS AS NEEDED FOR NAUSEA/VOMITING (Patient taking differently: Take 4 mg by mouth every 8  (eight) hours as needed. ) 30 tablet 0  . Polyethyl Glycol-Propyl Glycol (SYSTANE OP) Place 1 drop into both eyes 2 (two) times daily.     Marland Kitchen triamcinolone cream (KENALOG) 0.1 % Apply 1 application topically as needed. Mix with CeraVe cream apply twice daily for dry skin    . trimethoprim (TRIMPEX) 100 MG tablet TAKE ONE TABLET BY MOUTH EVERY NIGHT AT BEDTIME TO PREVENT BLADDER INFECTION. (Patient taking differently: Take 100 mg by mouth at bedtime. ) 30 tablet 11  . Vitamin D, Ergocalciferol, (DRISDOL) 50000 units CAPS capsule TAKE ONE CAPSULE BY MOUTH ON THE SAME DAY EVERY WEEK AS DIRECTED (Patient taking differently: Take 50,000 Units by mouth every 7 (seven) days. ) 12 capsule 1  . VITAMIN E PO Take 400 Units by mouth daily.    Marland Kitchen HYDROcodone-acetaminophen (NORCO) 7.5-325 MG tablet Take 1 tablet by mouth 4 (four) times daily. DO NOT FILL UNTIL 05/19/2018 (Patient not taking: Reported on 06/26/2018) 120 tablet  0  . HYDROcodone-acetaminophen (NORCO) 7.5-325 MG tablet Take 1 tablet by mouth 4 (four) times daily. (Patient not taking: Reported on 06/26/2018) 120 tablet 0  . Insulin Pen Needle (B-D ULTRAFINE III SHORT PEN) 31G X 8 MM MISC Use with administering insulin. Dx. E11.29 100 each 11  . ONE TOUCH ULTRA TEST test strip Use to test sugar three times daily dx E11.29       Discharge Medications: Please see discharge summary for a list of discharge medications.  Relevant Imaging Results:  Relevant Lab Results:   Additional Information [id number].   Wetzel Bjornstad, LCSWA

## 2018-06-26 NOTE — ED Notes (Signed)
Pt placed on purewick 

## 2018-06-26 NOTE — Evaluation (Signed)
Physical Therapy Evaluation Patient Details Name: Karen [name] MRN: [id number] DOB: Jul 20, 1924 Today's Date: 06/26/2018   History of Present Illness  Pt is a 84 y/o female presenting to the ED with multiple falls and generalized weakness. CT of head negative for acute abnormality. PMH includes a fib.   Clinical Impression  Pt admitted secondary to problem above with deficits below. Pt presenting with weakness and decreased balance. Pt requiring mod A to stand and perform side steps at EOB. Per pt's family, pt has had multiple falls at home. Was using rollator for ambulation, however, pt's daughter reports pt has required WC since this past Sunday. Per family, pt lives alone and does not have 24/7 assist. Feel pt would benefit from SNF level therapies to increase independence and safety with functional mobility. Will continue to follow acutely to maximize functional mobility independence and safety.     Follow Up Recommendations SNF;Supervision/Assistance - 24 hour    Equipment Recommendations  None recommended by PT    Recommendations for Other Services       Precautions / Restrictions Precautions Precautions: Fall Precaution Comments: Reports multiple falls within the past couple of weeks.  Restrictions Weight Bearing Restrictions: No      Mobility  Bed Mobility Overal bed mobility: Needs Assistance Bed Mobility: Supine to Sit;Sit to Supine     Supine to sit: Mod assist Sit to supine: Max assist   General bed mobility comments: Mod A for LE assist and trunk elevation to come to EOB. Max A for LE assist and trunk assist to return to supine.   Transfers Overall transfer level: Needs assistance Equipment used: 1 person hand held assist Transfers: Sit to/from Stand Sit to Stand: Mod assist         General transfer comment: Mod A for lift assist and steadying to stand. PT stood in front of pt and had pt hold onto PT arms.   Ambulation/Gait Ambulation/Gait  assistance: Mod assist   Assistive device: 1 person hand held assist   Gait velocity: Decreased    General Gait Details: Performed side steps at EOB. Mod A required for steadying. Pt with limited weightshift to LLE secondary to pain in L knee. Further mobility limited secondary to pain.   Stairs            Wheelchair Mobility    Modified Rankin (Stroke Patients Only)       Balance Overall balance assessment: Needs assistance Sitting-balance support: No upper extremity supported;Feet supported Sitting balance-Leahy Scale: Fair     Standing balance support: Bilateral upper extremity supported;During functional activity Standing balance-Leahy Scale: Poor Standing balance comment: Reliant on BUE and external support.                              Pertinent Vitals/Pain Pain Assessment: Faces Faces Pain Scale: Hurts little more Pain Location: L knee  Pain Descriptors / Indicators: Aching;Grimacing;Guarding Pain Intervention(s): Limited activity within patient's tolerance;Monitored during session;Repositioned    Home Living Family/patient expects to be discharged to:: Private residence Living Arrangements: Alone Available Help at Discharge: Family;Available PRN/intermittently Type of Home: House Home Access: Ramped entrance     Home Layout: One level Home Equipment: [place] - 4 wheels;Wheelchair - manual      Prior Function Level of Independence: Needs assistance   Gait / Transfers Assistance Needed: Per family, pt was ambulating with a rollator about a week ago and since Sunday has been having to  use WC for mobility. Needed assist with transfers.   ADL's / Homemaking Assistance Needed: Per family, pt had not been able to get into shower over the past week, however, previously was able to stand in shower to bathe.         Hand Dominance        Extremity/Trunk Assessment   Upper Extremity Assessment Upper Extremity Assessment: Generalized  weakness    Lower Extremity Assessment Lower Extremity Assessment: LLE deficits/detail;Generalized weakness LLE Deficits / Details: Limited ROM in knee secondary to pain.     Cervical / Trunk Assessment Cervical / Trunk Assessment: Kyphotic  Communication   Communication: No difficulties  Cognition Arousal/Alertness: Awake/alert Behavior During Therapy: WFL for tasks assessed/performed Overall Cognitive Status: Within Functional Limits for tasks assessed                                        General Comments General comments (skin integrity, edema, etc.): Pt's daughter and son in law present, along with her friend.     Exercises     Assessment/Plan    PT Assessment Patient needs continued PT services  PT Problem List Decreased strength;Decreased activity tolerance;Decreased balance;Decreased mobility;Decreased knowledge of use of DME;Decreased knowledge of precautions;Pain       PT Treatment Interventions DME instruction;Gait training;Functional mobility training;Therapeutic activities;Therapeutic exercise;Balance training;Patient/family education    PT Goals (Current goals can be found in the Care Plan section)  Acute Rehab PT Goals Patient Stated Goal: to go home  PT Goal Formulation: With patient Time For Goal Achievement: 07/10/18 Potential to Achieve Goals: Fair    Frequency Min 2X/week   Barriers to discharge Decreased caregiver support      Co-evaluation               AM-PAC PT "6 Clicks" Mobility  Outcome Measure Help needed turning from your back to your side while in a flat bed without using bedrails?: A Lot Help needed moving from lying on your back to sitting on the side of a flat bed without using bedrails?: A Lot Help needed moving to and from a bed to a chair (including a wheelchair)?: A Lot Help needed standing up from a chair using your arms (e.g., wheelchair or bedside chair)?: A Lot Help needed to walk in hospital room?:  A Lot Help needed climbing 3-5 steps with a railing? : Total 6 Click Score: 11    End of Session Equipment Utilized During Treatment: Gait belt Activity Tolerance: Patient limited by pain Patient left: in bed;with call bell/phone within reach;with family/visitor present Nurse Communication: Mobility status PT Visit Diagnosis: Unsteadiness on feet (R26.81);Repeated falls (R29.6);Muscle weakness (generalized) (M62.81);History of falling (Z91.81)    Time: 2536-6440 PT Time Calculation (min) (ACUTE ONLY): 23 min   Charges:   PT Evaluation $PT Eval Moderate Complexity: 1 Mod PT Treatments $Therapeutic Activity: 8-22 mins        Leighton Ruff, PT, DPT  Acute Rehabilitation Services  Pager: [phone] Office: [phone]   Rudean Hitt 06/26/2018, 3:46 PM

## 2018-06-26 NOTE — ED Notes (Signed)
Physical therapy states pt is not cleared and is recommending placement in SNF, which pt refuses to go to. Case management recommending PT, nursing, and social work follow-up in the home.

## 2018-06-26 NOTE — Consult Note (Signed)
   [facility] Inpatient Consult   06/26/2018  Karen [name] [birth year] [id number]   [facility] Care Management referral received. However, patient is currently in [facility] ED. Will follow up with patient if admitted. Otherwise, will refer back to [place] Management office.   Marthenia Rolling, MSN-Ed, RN,BSN [facility] Liaison [phone]

## 2018-06-26 NOTE — Telephone Encounter (Signed)
-----   Message from Eustace Pen, LPN sent at 97/84/7841  2:00 PM EST ----- Regarding: Labs 11/26 Lab orders needed. Thank you.  Insurance:  [facility] Medicare

## 2018-06-26 NOTE — Progress Notes (Addendum)
CSW faxed pt's Physical Therapy note to [facility].   Social work continuing to follow for SNF placement. SNF placement at [facility] is pending authorization from insurance.   Wendelyn Breslow, [facility]  [phone]

## 2018-06-26 NOTE — ED Notes (Signed)
Phone call complete to [phone],  Made aware that pt's discharge is pending PT.

## 2018-06-26 NOTE — ED Notes (Signed)
Social work bedside discussing plan of care.

## 2018-06-27 ENCOUNTER — Other Ambulatory Visit: Payer: Self-pay | Admitting: Internal Medicine

## 2018-06-27 DIAGNOSIS — N390 Urinary tract infection, site not specified: Secondary | ICD-10-CM | POA: Diagnosis not present

## 2018-06-27 DIAGNOSIS — E860 Dehydration: Secondary | ICD-10-CM | POA: Diagnosis not present

## 2018-06-27 DIAGNOSIS — E119 Type 2 diabetes mellitus without complications: Secondary | ICD-10-CM | POA: Diagnosis not present

## 2018-06-27 DIAGNOSIS — M6281 Muscle weakness (generalized): Secondary | ICD-10-CM | POA: Diagnosis not present

## 2018-06-27 DIAGNOSIS — R531 Weakness: Secondary | ICD-10-CM | POA: Diagnosis not present

## 2018-06-27 DIAGNOSIS — R296 Repeated falls: Secondary | ICD-10-CM | POA: Diagnosis not present

## 2018-06-27 DIAGNOSIS — R41841 Cognitive communication deficit: Secondary | ICD-10-CM | POA: Diagnosis not present

## 2018-06-27 DIAGNOSIS — I130 Hypertensive heart and chronic kidney disease with heart failure and stage 1 through stage 4 chronic kidney disease, or unspecified chronic kidney disease: Secondary | ICD-10-CM | POA: Diagnosis not present

## 2018-06-27 DIAGNOSIS — Z85828 Personal history of other malignant neoplasm of skin: Secondary | ICD-10-CM | POA: Diagnosis not present

## 2018-06-27 DIAGNOSIS — Z7982 Long term (current) use of aspirin: Secondary | ICD-10-CM | POA: Diagnosis not present

## 2018-06-27 DIAGNOSIS — E039 Hypothyroidism, unspecified: Secondary | ICD-10-CM | POA: Diagnosis not present

## 2018-06-27 DIAGNOSIS — I509 Heart failure, unspecified: Secondary | ICD-10-CM | POA: Diagnosis not present

## 2018-06-27 DIAGNOSIS — E1140 Type 2 diabetes mellitus with diabetic neuropathy, unspecified: Secondary | ICD-10-CM | POA: Diagnosis not present

## 2018-06-27 DIAGNOSIS — Z8673 Personal history of transient ischemic attack (TIA), and cerebral infarction without residual deficits: Secondary | ICD-10-CM | POA: Diagnosis not present

## 2018-06-27 DIAGNOSIS — R2689 Other abnormalities of gait and mobility: Secondary | ICD-10-CM | POA: Diagnosis not present

## 2018-06-27 DIAGNOSIS — R001 Bradycardia, unspecified: Secondary | ICD-10-CM | POA: Diagnosis not present

## 2018-06-27 DIAGNOSIS — Z794 Long term (current) use of insulin: Secondary | ICD-10-CM | POA: Diagnosis not present

## 2018-06-27 DIAGNOSIS — G252 Other specified forms of tremor: Secondary | ICD-10-CM | POA: Diagnosis not present

## 2018-06-27 DIAGNOSIS — H3500 Unspecified background retinopathy: Secondary | ICD-10-CM | POA: Diagnosis not present

## 2018-06-27 DIAGNOSIS — M1712 Unilateral primary osteoarthritis, left knee: Secondary | ICD-10-CM | POA: Diagnosis not present

## 2018-06-27 DIAGNOSIS — N184 Chronic kidney disease, stage 4 (severe): Secondary | ICD-10-CM | POA: Diagnosis not present

## 2018-06-27 DIAGNOSIS — E1122 Type 2 diabetes mellitus with diabetic chronic kidney disease: Secondary | ICD-10-CM | POA: Diagnosis not present

## 2018-06-27 DIAGNOSIS — F419 Anxiety disorder, unspecified: Secondary | ICD-10-CM | POA: Diagnosis not present

## 2018-06-27 DIAGNOSIS — M179 Osteoarthritis of knee, unspecified: Secondary | ICD-10-CM | POA: Diagnosis not present

## 2018-06-27 DIAGNOSIS — Z79899 Other long term (current) drug therapy: Secondary | ICD-10-CM | POA: Diagnosis not present

## 2018-06-27 DIAGNOSIS — N183 Chronic kidney disease, stage 3 (moderate): Secondary | ICD-10-CM | POA: Diagnosis not present

## 2018-06-27 DIAGNOSIS — R1314 Dysphagia, pharyngoesophageal phase: Secondary | ICD-10-CM | POA: Diagnosis not present

## 2018-06-27 DIAGNOSIS — I480 Paroxysmal atrial fibrillation: Secondary | ICD-10-CM | POA: Diagnosis not present

## 2018-06-27 LAB — CBG MONITORING, ED
GLUCOSE-CAPILLARY: 157 mg/dL — AB (ref 70–99)
GLUCOSE-CAPILLARY: 182 mg/dL — AB (ref 70–99)

## 2018-06-27 MED ORDER — PROSIGHT PO TABS
1.0000 | ORAL_TABLET | Freq: Every day | ORAL | Status: DC
  Administered 2018-06-27: 1 via ORAL
  Filled 2018-06-27: qty 1

## 2018-06-27 NOTE — Progress Notes (Addendum)
12:41pm-CSW asked to speak with family at beside to give update. CSW updated family that at this time Karen [name] has submitted for authorization from [name] with no approval or denial at this time. CSW explained that with it being the holidays CSW is unsure if [name] is open to accept authorization but was made sure that [name] from [employer] had started authorization.   CSW spoke with [name] and she will call family to give update at this time.   11:38am-CW informed by [name] with [place] [facility] that Karen [name] was submitted on yesterday however it never went through therefor [name] had to restart auth this morning at 9:45am. [name] to call CSW once Karen [name] has been received.   8:17am-CSW spoke with [name] from [name] and was informed that authorization was started on yesterday.  [name] to call CSW once authorization has been received from [name].    CSW followed up with Karen [name] to check status of authorization from [name] at this time. CSW awaits response.   [name], MSW, [place] [facility] Clinical Social Worker [phone]

## 2018-06-27 NOTE — Progress Notes (Addendum)
CSW received word from [place], admissions with Isaias Cowman, that pt's authorization from insurance has come through. CSW faxed AVS to [facility]. Pt updated. Pt reaching out to granddaughter to transport her to [facility].   CSW updated RN and EDP.   Pt's room [phone] Number for Report [phone]  [facility]  [phone]

## 2018-06-27 NOTE — ED Notes (Signed)
Report given to Timmothy Sours at [facility]

## 2018-06-27 NOTE — ED Notes (Signed)
Pt repositioned in chair

## 2018-07-03 DIAGNOSIS — R531 Weakness: Secondary | ICD-10-CM | POA: Diagnosis not present

## 2018-07-03 DIAGNOSIS — F419 Anxiety disorder, unspecified: Secondary | ICD-10-CM | POA: Diagnosis not present

## 2018-07-03 DIAGNOSIS — R296 Repeated falls: Secondary | ICD-10-CM | POA: Diagnosis not present

## 2018-07-03 DIAGNOSIS — G252 Other specified forms of tremor: Secondary | ICD-10-CM | POA: Diagnosis not present

## 2018-07-06 ENCOUNTER — Telehealth: Payer: Self-pay

## 2018-07-06 ENCOUNTER — Encounter: Payer: Self-pay | Admitting: Family Medicine

## 2018-07-06 ENCOUNTER — Encounter: Payer: Self-pay | Admitting: *Deleted

## 2018-07-06 ENCOUNTER — Other Ambulatory Visit: Payer: Self-pay | Admitting: *Deleted

## 2018-07-06 NOTE — Patient Outreach (Signed)
[place] [facility]) Care Management [place] Telephone [facility] Coordination   07/06/2018  Karen [name] 1923/08/11 [id number]  Successful telephone outreach to Karen [name], 84 y/o female referred to [facility] by ED RN CM after recent ED visit 06/26/18 for generalized weakness, dehydration, and fall.  Apparently, patient was discharged from ED to SNF for rehabilitation; this information was not provided at time of referral receipt.  Patient has history including, but not limited to, HTN/ HLD; CKD- stage III; OSA; GERD; recent falls; DM- type II with neuropathy; pA-Fib; previous TIA/ CVA; and anxiety.  HIPAA/ identity verified with patient during brief phone call today.  Today, patient reports that she is "doing well;" she informs me today that she has been at [facility] for rehabilitation "since ED visit at end of November."  Discussed [employer] services with patient, and she remembers [place] assistance she received last year; discussed with patient that I would place [facility] CSW referral for ongoing follow up while she resides at [facility], patient is agreeable to this.  Patient states that she may be discharging from [facility] "sometime in December, maybe on December 16;" stating she is "not sure about" exact discharge plans at this time.  Plan:  Will place [facility] CSW referral to follow up on patient's discharge disposition while at SNF  Oneta Rack, RN, BSN, [place] Care Management  [phone]

## 2018-07-06 NOTE — Telephone Encounter (Signed)
[facility] faxed note pt missed a call from [facility].

## 2018-07-08 ENCOUNTER — Other Ambulatory Visit: Payer: Self-pay | Admitting: Family Medicine

## 2018-07-09 ENCOUNTER — Ambulatory Visit: Payer: Self-pay | Admitting: *Deleted

## 2018-07-09 ENCOUNTER — Other Ambulatory Visit: Payer: Self-pay | Admitting: *Deleted

## 2018-07-09 NOTE — Telephone Encounter (Signed)
I have not called patient.  She is past due for her [place] which she has cancelled appointments for on 06/26/2018 with [facility].  07/06/2018 & 07/10/2018 with Dr. Diona Browner.  She was also scheduled with [facility] 07/12/2018 which was also cancelled .

## 2018-07-09 NOTE — Patient Outreach (Signed)
[place] [facility]) Care Management  07/09/2018  KENZIE THORESON [birth year] [id number]   Post Acute Care Coordination  Phone call to [facility] voicemail message left for the discharge planner requesting a return call to discuss patient's progress in rehab and anticipated discharge plan.   Plan: This social will plan to visit patient at the facility within 1 week for an update on patient's progress in therapy and anticipated discahrge plan.   Sheralyn Boatman [facility] Care Management [phone]

## 2018-07-10 ENCOUNTER — Other Ambulatory Visit: Payer: Self-pay | Admitting: *Deleted

## 2018-07-10 ENCOUNTER — Ambulatory Visit: Payer: Self-pay | Admitting: Family Medicine

## 2018-07-10 DIAGNOSIS — M1712 Unilateral primary osteoarthritis, left knee: Secondary | ICD-10-CM | POA: Diagnosis not present

## 2018-07-10 DIAGNOSIS — E119 Type 2 diabetes mellitus without complications: Secondary | ICD-10-CM | POA: Diagnosis not present

## 2018-07-10 DIAGNOSIS — Z794 Long term (current) use of insulin: Secondary | ICD-10-CM | POA: Diagnosis not present

## 2018-07-10 DIAGNOSIS — F419 Anxiety disorder, unspecified: Secondary | ICD-10-CM | POA: Diagnosis not present

## 2018-07-10 NOTE — Patient Outreach (Signed)
[place] [facility]) Care Management  07/10/2018  Karen [name] 01/13/1924 [id number]   Post Acute Care Call/Visit  Phone call to the discharge planner Dawn at [facility] to follow up on patient's progress in therapy and to discuss any barriers to discharge.Per Dawn, patient's anticipated discharge is scheduled for 07/17/18 with HH. Patient will discharge with a hospital bed and wheelchair. Patient lives alone but has a son that is actively  involved in her care who will be arranging for Meals on Wheels and who will be working to increase patient's in home care through Aid and Attendance. At this time, patient receives 4 hours of in home care.  Patient has a life alert system and uses senior wheels for transportation to medical appointments.   Patient currently working on strength and balance while in therapy.   Plan: This social worker to continue to follow patient's progress while in rehab.   Sheralyn Boatman [facility] Care Management [phone]

## 2018-07-12 ENCOUNTER — Other Ambulatory Visit: Payer: Self-pay | Admitting: Internal Medicine

## 2018-07-12 ENCOUNTER — Ambulatory Visit: Payer: Self-pay

## 2018-07-12 DIAGNOSIS — E039 Hypothyroidism, unspecified: Secondary | ICD-10-CM

## 2018-07-16 ENCOUNTER — Other Ambulatory Visit: Payer: Self-pay | Admitting: Family Medicine

## 2018-07-16 ENCOUNTER — Other Ambulatory Visit: Payer: Self-pay | Admitting: Internal Medicine

## 2018-07-16 DIAGNOSIS — Z794 Long term (current) use of insulin: Secondary | ICD-10-CM | POA: Diagnosis not present

## 2018-07-16 DIAGNOSIS — E039 Hypothyroidism, unspecified: Secondary | ICD-10-CM | POA: Diagnosis not present

## 2018-07-16 DIAGNOSIS — N184 Chronic kidney disease, stage 4 (severe): Secondary | ICD-10-CM | POA: Diagnosis not present

## 2018-07-16 DIAGNOSIS — E119 Type 2 diabetes mellitus without complications: Secondary | ICD-10-CM | POA: Diagnosis not present

## 2018-07-18 ENCOUNTER — Other Ambulatory Visit: Payer: Self-pay | Admitting: Family Medicine

## 2018-07-19 ENCOUNTER — Ambulatory Visit: Payer: Self-pay | Admitting: *Deleted

## 2018-07-19 ENCOUNTER — Other Ambulatory Visit: Payer: Self-pay | Admitting: *Deleted

## 2018-07-19 NOTE — Patient Outreach (Signed)
[place] [facility]) Care Management  07/19/2018  JEANEE FABRE [birth year] [id number]   This social worker arrived to [employer] to visit patient, however learned that she had had discharged. Per patient ping, patient discharged on 07/16/18.   Plan: RNCM Reginia Naas informed of patient's discharge for transition of care.   Sheralyn Boatman [facility] Care Management [phone]

## 2018-07-20 ENCOUNTER — Telehealth: Payer: Self-pay | Admitting: *Deleted

## 2018-07-20 ENCOUNTER — Encounter: Payer: Self-pay | Admitting: *Deleted

## 2018-07-20 ENCOUNTER — Other Ambulatory Visit: Payer: Self-pay | Admitting: *Deleted

## 2018-07-20 ENCOUNTER — Other Ambulatory Visit: Payer: Self-pay | Admitting: Family Medicine

## 2018-07-20 DIAGNOSIS — F419 Anxiety disorder, unspecified: Secondary | ICD-10-CM

## 2018-07-20 NOTE — Telephone Encounter (Signed)
Verbal orders given to [facility]

## 2018-07-20 NOTE — Telephone Encounter (Signed)
Last filled 06-19-18 #45 Last OV Acute 05-30-18 Next OV 08-03-18 [name]

## 2018-07-20 NOTE — Telephone Encounter (Signed)
Spoke to [place] with [facility] is requesting to resume to nursing, OT and PT post discharge from [facility]. pls advise

## 2018-07-20 NOTE — Patient Outreach (Signed)
[name]) Care Management [name], Transition of Care day # 1 Post- SNF discharge day # 4 07/20/2018  Karen [name] January 31, 1924 [id number]  Successful telephone outreach to Karen [name], 84 y/o female referred to [place] by ED RN CM after recent ED visit 06/26/18 for generalized weakness, dehydration, and fall.  Apparently, patient was discharged from ED to SNF for rehabilitation; this information was not provided at time of referral receipt.  Patient has history including, but not limited to, HTN/ HLD; CKD- stage III; OSA; GERD; recent falls; DM- type II with neuropathy; pA-Fib; previous TIA/ CVA; and anxiety.    Received notification yesterday from [place] that patient had been discharged home from SNF/ rehabilitation facility on Monday July 16, 2018.  HIPAA/ identity verified with patient during phone call today.  Today, patient reports that she is "doing real good," post- SNF discharge and she denies pain today, although she acknowledges that she continues to have intermittent chronic pain in knees.  States "feeling pretty good," today.  Denies new/ recent falls post- SNF discharge.  Patient sounds to be in no distress throughout phone call today.  Patient further reports:  Medications: -- Has all medicationsand takes as prescribed;denies questions/ concerns around current medications.  -- self-manage medications using weekly pill planner box, family picks up prescriptions for patient once they are ready at her outpatient pharmacy -- denies issues with swallowing medications -- patient declines medication review today, stating that she has just put her medications away and is resting; discussed with patient value of prompt medication review post- SNF discharge; patient agrees to medication review at time of next telephone outreach, and assures me several times that she has and is talking all medications  as prescribed post- SNF discharge  Home health [facility]) services: -- reports [place] services for PT, OT, RN "are supposed to be" in place through [facility] agency post- SNF discharge; however, patient reports that she has not yet heard from [place] team  10:20 am:  [place] placed to [facility]; spoke with clinical supervisor "[name];" [name] confirms that she received referral for patient on July 12, 2018 from SNF ([place]), however, confirms that she did not receive notification of patient's discharge form SNF staff; [name] agrees today to contact PCP office to confirm order, and provided myself and patient contact information for [facility] agency; patient was encouraged to actively participate in all [facility] disciplines as ordered.  [name] confirmed that order for RN, PT, OT, and CSW had been placed; DME was discussed during call today, and patient confirms that she has received new hospital bed and wheelchair as ordered.  Provider appointments: -- All upcoming provider appointments were reviewed with patient today; patient provides accurate reporting of scheduled provider appointments and states that she plans to attend all  Safety/ Mobility/ Falls: -- denies new/ recent falls post- SNF discharge -- assistive devices: has new wheelchair and uses walker "all the time" -- general fall risks/ prevention education discussed with patient today, patient was encouraged to consistently use assistive devices and to be conservative and deliberate in her ambulation and activity at home; patient also noted to have [place] that she wears "all the time"  [place] needs: -- currently denies community resource needs, stating supportive family members that assist with care needs as indicated; has local son and daughter that visit her regularly and have been staying with her temporarily at night  post- SNF discharge -- family provides transportation for patient to all  provider appointments, errands, etc  Advanced Directive (AD) Planning:   --reports does have have exisisting AD in place for HCPOA and living will; declines desire to make changes to either today  Patient denies further issues, concerns, or problems today.  I provided and confirmed that patient has my direct phone number, the main [facility] CM office phone number, and the [facility] CM 24-hour nurse advice phone number should issues arise prior to next scheduled [name] outreach.  Encouraged patient to contact me directly if needs, questions, issues, or concerns arise prior to next scheduled outreach; patient agreed to do so.  Plan:  Patient will take medications as prescribed and will attend all scheduled provider appointments  Patient will promptly notify care providers for any new concerns/ issues/ problems that arise  Patient will actively participate in home health services as ordered post-hospital discharge  Patient will continue using assistive devices for ambulation   I will make patient's PCP aware of [place] RN CM involvement in patient's care-- will send barriers letter  [place] outreach to continue with scheduled phone call next week   [facility] CM Care Plan Problem One     Most Recent Value  Care Plan Problem One  High fall risk, as evidenced by patient reporting and recent ED visit and subsequent SNF rehabilitation visit with multiple falls reported by patient in 2019  Role Documenting the Problem One  Care Management [place] for Problem One  Active  THN Long Term Goal   Over the next 31 days, patient will not experience hospital readmission, as evidenced by patient reporting and review of EMR during [place] outreach  [facility] Long Term Goal Start Date  07/20/18  Interventions for Problem One Long Term Goal  Discussed with patient recent falls and subsequent ED and SNF visits,  discussed with patient fall prevention strategies and using teachback  method, provided and discussed fall risks and prevention strategies,  confirmed that patient has no concerns aorund current medications post- SNF discharge, and that she endorses taking all medications as prescribed.  Confirmed that patient is consistently using assistive devices for ambulation at home  [facility] CM Short Term Goal #1   Over the next 30 days, patient will actively participate in home health sevrices as ordered post- SNF, as evidenced by patient reporting and collaboration withhome health team as indicated during [place] outreach  [facility] CM Short Term Goal #1 Start Date  07/20/18  Interventions for Short Term Goal #1  Discussed home health services with patient,  placed care coordination outreach to home health agency/ team and faciliated initiation of home health services,  encouraged patient's ongoing active participation in home health services and discussed difference between home health services and [facility] CM services  THN CM Short Term Goal #2   Over the next 30 days, patient will attend all scheduled provider appointments as evidenced by patient reporting/ provider collaboration as indicated, and review of EMR during [place] outreach  [facility] CM Short Term Goal #2 Start Date  07/20/18  Interventions for Short Term Goal #2  Reviewed upcoming provider appointments with patient and confirmed that she has reliable transportation to all scheduled provider appointmnets,  encouraged patient to promptly notify care providers for any new concerns, issues or problems that arise    [facility] CM Care Plan Problem Two     Most Recent Value  Care Plan Problem Two  High falls Risk as evidenced by multiple recent falls reported by patient with subsequent ED visits/ SNF admission for rehabilitation  Role Documenting the Problem Two  Care Management [place] for Problem Two  Active  Interventions for Problem Two Long Term Goal   Using teach back method, discussed with patient value of consistent use  of assistive devices now that she is back at home,  confirmed that she has received DME that was ordered post- SNF discharge,  encouraged patient to be conservative in her activity and to move slowly and not rush  THN Long Term Goal  Over the next 45 days patient will use assistive devices for ambulation, as evidenced by patient reporting during [facility] RN CCM outreach  [facility] Long Term Goal Start Date  07/20/18     I appreciate the opportunity to participate in Odalys's care,  Oneta Rack, RN, BSN, [employer] Coordinator [facility] Care Management  [phone]

## 2018-07-20 NOTE — Telephone Encounter (Signed)
Okay to give verbal order.. let me know if something else is required.

## 2018-07-22 DIAGNOSIS — E1151 Type 2 diabetes mellitus with diabetic peripheral angiopathy without gangrene: Secondary | ICD-10-CM | POA: Diagnosis not present

## 2018-07-22 DIAGNOSIS — I130 Hypertensive heart and chronic kidney disease with heart failure and stage 1 through stage 4 chronic kidney disease, or unspecified chronic kidney disease: Secondary | ICD-10-CM | POA: Diagnosis not present

## 2018-07-22 DIAGNOSIS — G894 Chronic pain syndrome: Secondary | ICD-10-CM | POA: Diagnosis not present

## 2018-07-22 DIAGNOSIS — I509 Heart failure, unspecified: Secondary | ICD-10-CM | POA: Diagnosis not present

## 2018-07-22 DIAGNOSIS — Z794 Long term (current) use of insulin: Secondary | ICD-10-CM | POA: Diagnosis not present

## 2018-07-22 DIAGNOSIS — Z9181 History of falling: Secondary | ICD-10-CM | POA: Diagnosis not present

## 2018-07-22 DIAGNOSIS — E1142 Type 2 diabetes mellitus with diabetic polyneuropathy: Secondary | ICD-10-CM | POA: Diagnosis not present

## 2018-07-22 DIAGNOSIS — E039 Hypothyroidism, unspecified: Secondary | ICD-10-CM | POA: Diagnosis not present

## 2018-07-22 DIAGNOSIS — N184 Chronic kidney disease, stage 4 (severe): Secondary | ICD-10-CM | POA: Diagnosis not present

## 2018-07-22 DIAGNOSIS — G4730 Sleep apnea, unspecified: Secondary | ICD-10-CM | POA: Diagnosis not present

## 2018-07-22 DIAGNOSIS — I480 Paroxysmal atrial fibrillation: Secondary | ICD-10-CM | POA: Diagnosis not present

## 2018-07-22 DIAGNOSIS — K219 Gastro-esophageal reflux disease without esophagitis: Secondary | ICD-10-CM | POA: Diagnosis not present

## 2018-07-22 DIAGNOSIS — Z8673 Personal history of transient ischemic attack (TIA), and cerebral infarction without residual deficits: Secondary | ICD-10-CM | POA: Diagnosis not present

## 2018-07-22 DIAGNOSIS — I2510 Atherosclerotic heart disease of native coronary artery without angina pectoris: Secondary | ICD-10-CM | POA: Diagnosis not present

## 2018-07-22 DIAGNOSIS — M170 Bilateral primary osteoarthritis of knee: Secondary | ICD-10-CM | POA: Diagnosis not present

## 2018-07-22 DIAGNOSIS — E1122 Type 2 diabetes mellitus with diabetic chronic kidney disease: Secondary | ICD-10-CM | POA: Diagnosis not present

## 2018-07-22 DIAGNOSIS — M4800 Spinal stenosis, site unspecified: Secondary | ICD-10-CM | POA: Diagnosis not present

## 2018-07-23 ENCOUNTER — Telehealth: Payer: Self-pay | Admitting: *Deleted

## 2018-07-23 NOTE — Telephone Encounter (Signed)
Spoke to [place] with [facility] who is requesting skilled nursing 2x/wk for 3wks and 2prns for afib, chronic kidney disease and medication management. She also reports that the pt keeps having falls, but has started using her wheelchair and walker. pls advise

## 2018-07-23 NOTE — Telephone Encounter (Signed)
Okay to given verbal order as requested.

## 2018-07-23 NOTE — Telephone Encounter (Signed)
Verbal order given to [facility] at [place] as instructed and verbalized understanding.

## 2018-07-26 DIAGNOSIS — I2510 Atherosclerotic heart disease of native coronary artery without angina pectoris: Secondary | ICD-10-CM | POA: Diagnosis not present

## 2018-07-26 DIAGNOSIS — Z9181 History of falling: Secondary | ICD-10-CM | POA: Diagnosis not present

## 2018-07-26 DIAGNOSIS — Z8673 Personal history of transient ischemic attack (TIA), and cerebral infarction without residual deficits: Secondary | ICD-10-CM | POA: Diagnosis not present

## 2018-07-26 DIAGNOSIS — G894 Chronic pain syndrome: Secondary | ICD-10-CM | POA: Diagnosis not present

## 2018-07-26 DIAGNOSIS — I509 Heart failure, unspecified: Secondary | ICD-10-CM | POA: Diagnosis not present

## 2018-07-26 DIAGNOSIS — E1142 Type 2 diabetes mellitus with diabetic polyneuropathy: Secondary | ICD-10-CM | POA: Diagnosis not present

## 2018-07-26 DIAGNOSIS — M170 Bilateral primary osteoarthritis of knee: Secondary | ICD-10-CM | POA: Diagnosis not present

## 2018-07-26 DIAGNOSIS — E1122 Type 2 diabetes mellitus with diabetic chronic kidney disease: Secondary | ICD-10-CM | POA: Diagnosis not present

## 2018-07-26 DIAGNOSIS — G4730 Sleep apnea, unspecified: Secondary | ICD-10-CM | POA: Diagnosis not present

## 2018-07-26 DIAGNOSIS — I130 Hypertensive heart and chronic kidney disease with heart failure and stage 1 through stage 4 chronic kidney disease, or unspecified chronic kidney disease: Secondary | ICD-10-CM | POA: Diagnosis not present

## 2018-07-26 DIAGNOSIS — I480 Paroxysmal atrial fibrillation: Secondary | ICD-10-CM | POA: Diagnosis not present

## 2018-07-26 DIAGNOSIS — E039 Hypothyroidism, unspecified: Secondary | ICD-10-CM | POA: Diagnosis not present

## 2018-07-26 DIAGNOSIS — M4800 Spinal stenosis, site unspecified: Secondary | ICD-10-CM | POA: Diagnosis not present

## 2018-07-26 DIAGNOSIS — E1151 Type 2 diabetes mellitus with diabetic peripheral angiopathy without gangrene: Secondary | ICD-10-CM | POA: Diagnosis not present

## 2018-07-26 DIAGNOSIS — N184 Chronic kidney disease, stage 4 (severe): Secondary | ICD-10-CM | POA: Diagnosis not present

## 2018-07-26 DIAGNOSIS — Z794 Long term (current) use of insulin: Secondary | ICD-10-CM | POA: Diagnosis not present

## 2018-07-26 DIAGNOSIS — K219 Gastro-esophageal reflux disease without esophagitis: Secondary | ICD-10-CM | POA: Diagnosis not present

## 2018-07-27 ENCOUNTER — Telehealth: Payer: Self-pay

## 2018-07-27 ENCOUNTER — Encounter: Payer: Self-pay | Admitting: *Deleted

## 2018-07-27 ENCOUNTER — Other Ambulatory Visit: Payer: Self-pay | Admitting: *Deleted

## 2018-07-27 ENCOUNTER — Telehealth: Payer: Self-pay | Admitting: *Deleted

## 2018-07-27 DIAGNOSIS — Z794 Long term (current) use of insulin: Principal | ICD-10-CM

## 2018-07-27 DIAGNOSIS — E1122 Type 2 diabetes mellitus with diabetic chronic kidney disease: Secondary | ICD-10-CM

## 2018-07-27 DIAGNOSIS — IMO0002 Reserved for concepts with insufficient information to code with codable children: Secondary | ICD-10-CM

## 2018-07-27 DIAGNOSIS — N182 Chronic kidney disease, stage 2 (mild): Principal | ICD-10-CM

## 2018-07-27 DIAGNOSIS — E1165 Type 2 diabetes mellitus with hyperglycemia: Principal | ICD-10-CM

## 2018-07-27 MED ORDER — INSULIN LISPRO (1 UNIT DIAL) 100 UNIT/ML (KWIKPEN): PEN_INJECTOR | SUBCUTANEOUS | 0 refills | Status: DC

## 2018-07-27 NOTE — Telephone Encounter (Signed)
She gets patient assistance. Waiting for that to come in. [facility] Left message for son.

## 2018-07-27 NOTE — Telephone Encounter (Signed)
Okay to refill humalog as requested See if there is an issue with the product per pharmacist

## 2018-07-27 NOTE — Telephone Encounter (Signed)
Spoke to Karen [name] at [place] at [facility] who is requesting verbal orders for OT 1/wk for 1wk, 2x/wk for 3wks and 1x/wk for 1wk. She is also requesting orders for social work for [employer]. pls advise

## 2018-07-27 NOTE — Patient Outreach (Signed)
[place] [facility]) Care Management [place], Transition of Care day 8 Post- SNF discharge day # 11 07/27/2018  Karen [name] 1924/01/16 [id number]  Successful telephone outreachtoKatherine Alroy [name], 84 y/o female referred to [place] by ED RN CM after recent ED visit 06/26/18 for generalized weakness, dehydration, and fall. Apparently, patient was discharged from ED to SNF for rehabilitation; this information was not provided at time of referral receipt.Patient has history including, but not limited to, HTN/ HLD; CKD- stage III; OSA; GERD; recent falls; DM- type II with neuropathy; pA-Fib; previous TIA/ CVA; and anxiety.  HIPAA/ identity verified with patient during phone call today.  Today, patient reports that she is "doing fine," she reports "normal/ usual" pain in her knee today.  States "things going good," today.  Denies new/ recent falls post- SNF discharge, and reports she continues using cane/ walker and wheelchair "all the time."  Patient sounds to be in no distress throughout phone call today.  Patient further reports:  Medications: -- Has all medicationsand takes as prescribed;denies questions/ concerns around current medications- reports that she had recently run out of her insulin, but that "everything is straightened out now;" states that her son contacted pharmacy and PCP and "handled the issue."  Reports that her son will pick up the insulin today from her outpatient pharmacy, and reports she has received notification that the insulin is ready for pick up.  -- continues self-managing medications using weekly pill planner box, with assistance form family as needed -- Patient was recently discharged from SNF and all medications were reviewed with patient during phone call today.  Home health [facility]) services: -- reports [place] services for PT, OT, RN, CSW through [facility] has now started; recent visits at  home were reviewed with patient and she confirms that she has the contact information for home health team; again encouraged patient to actively participate in a [facility] disciplines as ordered post SNF- discharge  Provider appointments: -- All upcoming provider appointments were reviewed with patient today; patient provides accurate reporting of scheduled provider appointments and states that she plans to attend all; states family will continue to transport and attend with patient; scheduled PCP office visit noted 08/03/18  Social/ Community Resource needs: -- previously denied community resource needs, stating supportive family members that assist with care needs as indicated; however today, she reports that the home health CSW is helping her with resources around in-home care; states that she has talked with home health CSW, who "filled out an application" for in-home personal care assistance, although patient is unsure exactly which agency this application was completed for-- patient reports that she "guesses" the home health CSW will follow up with her around this application, although she is "not sure when." -- states today that she would like to have meals on wheels; I offered to complete [facility] CSW to discuss this resource option with patient and she is agreeable to my placing [facility] CSW referral -- continues to report supportive and actively involved family network; local son and daughter that visit her regularly and provide transportation for patient to all provider appointments, errands, etc  Patient denies further issues, concerns, or problems today. I confirmed that patient hasmy direct phone number, the main [facility] CM office phone number, and the [facility] CM 24-hour nurse advice phone number should issues arise prior to next scheduled [place] outreach.  Encouraged patient to contact me directly if needs, questions, issues, or concerns arise  prior to next scheduled outreach; patient agreed to do  so.  Plan:  Patient will take medications as prescribed and will attend all scheduled provider appointments  Patient will promptly notify care providers for any new concerns/ issues/ problems that arise  Patient will actively participate in home health services as ordered post-hospital discharge  Patient will continue using assistive devices for ambulation   I will make [place] referral for community resources around nutrition/ meals on wheels/ possible need for assistance around in home personal care assistance resources  [place] outreach to continue with scheduled phone call next week  [facility] CM Care Plan Problem One     Most Recent Value  Care Plan Problem One  High risk for hospital readmission, as evidenced by patient reporting and recent ED visit and subsequent SNF rehabilitation visit with multiple falls reported by patient in 2019 [Problem re-stated today]  Role Documenting the Problem One  Care Management Coordinator  Care Plan for Problem One  Active  THN Long Term Goal   Over the next 31 days, patient will not experience hospital readmission, as evidenced by patient reporting and review of EMR during [place] outreach  [facility] Long Term Goal Start Date  07/20/18  Interventions for Problem One Long Term Goal  Discussed with patient current cloinical condition and confirmed that she has no concerns around her clinical condition  THN CM Short Term Goal #1   Over the next 30 days, patient will actively participate in home health services as ordered post- SNF, as evidenced by patient reporting and collaboration with home health team as indicated during [place] CCM outreach  [facility] CM Short Term Goal #1 Start Date  07/20/18  Interventions for Short Term Goal #1  Confirmed that patient is now established with home health team and is actively participating with all disciplines,  encouraged patient's ongoing active participation in [facility] services,  confirmed that  patient has contact information for [facility] team and reviewed recent [facility] visits with patient  THN CM Short Term Goal #2   Over the next 30 days, patient will attend all scheduled provider appointments as evidenced by patient reporting/ provider collaboration as indicated, and review of EMR during [facility] RN CCM outreach  [facility] CM Short Term Goal #2 Start Date  07/20/18  Interventions for Short Term Goal #2  Reviewed upcoming provider appointmnets with patient and confirmed that she continues to have reliable transportation to all provider appointments through family members    [facility] CM Care Plan Problem Two     Most Recent Value  Care Plan Problem Two  High fall risk as evidenced by multiple recent falls reported by patient with subsequent ED visits/ SNF admission for rehabilitation  Role Documenting the Problem Two  Care Management Coordinator  Care Plan for Problem Two  Active  Interventions for Problem Two Long Term Goal   Confirmed that patient has had not recent falls post-SNF discharge and that she continues using assistive devices as ordered / instructed,  fall risks and prevention education reiterated with patient using teach back method  [facility] Long Term Goal  Over the next 45 days patient will use assistive devices for ambulation, as evidenced by patient reporting during [facility] RN CCM outreach  [facility] Long Term Goal Start Date  07/20/18     Oneta Rack, RN, BSN, [employer] Coordinator [facility] Care Management  [phone]

## 2018-07-27 NOTE — Patient Outreach (Signed)
[place] [facility]) Care Management  07/27/2018  EIMY PLAZA 11-10-1923 [id number]   Patient referred to this social worker to follow up on in home care for patient and to complete a referral for meals on wheels. Per patient, she had an aid coming in for 4 hours daily, however the aid does not feel comfortable driving patient in her personal car. The aid was able to drive patient's cafre, however patient's granddaughter now drives the car. Per patient, she thinks that the aid will be able come to complete light housekeeping only but she will not be able to confirm this until next Thursday. Per patient, she may need a new aid but would not be able to pay more that $10.00 per hour.  Patient also would like a referral for Meals on Wheels. Per patient, her neighbor will sometimes provide meals for her and her children also make meals for her as well but she would like something more consistent. Patient agrees to referral to Meals on Wheels.   Plan: This social will refer patient to Meals on Wheels This social worker will follow up within 2 weeks regarding in home care resources.  Sheralyn Boatman [facility] Care Management [phone]

## 2018-07-27 NOTE — Telephone Encounter (Signed)
Pts son said pt is out of humalog insulin; waiting on humalog to come from manufacturer due to pt being in donut hole. pts son request [facility] until get med from manufacturer. Pt established 03/22/18.When tried to send in refill there was this pop up message; Humalog [employer] no longer available for ordering. Please advise and send in humalog pen to [place]. The original instructions on rx are correct per pts son.Dr Diona Browner out of office and pt is out of humalog insulin. Last BS was last night at 250. Pt has not checked FBS this morning because just getting out of bed.

## 2018-07-30 NOTE — Telephone Encounter (Signed)
Left message for [name] with [facility] giving verbal orders for OT 1/wk for 1wk, 2x/wk for 3wks and 1x/wk for 1wk as well as orders for Education officer, museum for community resources

## 2018-07-31 DIAGNOSIS — I130 Hypertensive heart and chronic kidney disease with heart failure and stage 1 through stage 4 chronic kidney disease, or unspecified chronic kidney disease: Secondary | ICD-10-CM | POA: Diagnosis not present

## 2018-07-31 DIAGNOSIS — M4800 Spinal stenosis, site unspecified: Secondary | ICD-10-CM | POA: Diagnosis not present

## 2018-07-31 DIAGNOSIS — Z794 Long term (current) use of insulin: Secondary | ICD-10-CM | POA: Diagnosis not present

## 2018-07-31 DIAGNOSIS — I480 Paroxysmal atrial fibrillation: Secondary | ICD-10-CM | POA: Diagnosis not present

## 2018-07-31 DIAGNOSIS — E1122 Type 2 diabetes mellitus with diabetic chronic kidney disease: Secondary | ICD-10-CM | POA: Diagnosis not present

## 2018-07-31 DIAGNOSIS — G4730 Sleep apnea, unspecified: Secondary | ICD-10-CM | POA: Diagnosis not present

## 2018-07-31 DIAGNOSIS — Z8673 Personal history of transient ischemic attack (TIA), and cerebral infarction without residual deficits: Secondary | ICD-10-CM | POA: Diagnosis not present

## 2018-07-31 DIAGNOSIS — I2510 Atherosclerotic heart disease of native coronary artery without angina pectoris: Secondary | ICD-10-CM | POA: Diagnosis not present

## 2018-07-31 DIAGNOSIS — I509 Heart failure, unspecified: Secondary | ICD-10-CM | POA: Diagnosis not present

## 2018-07-31 DIAGNOSIS — M170 Bilateral primary osteoarthritis of knee: Secondary | ICD-10-CM | POA: Diagnosis not present

## 2018-07-31 DIAGNOSIS — G894 Chronic pain syndrome: Secondary | ICD-10-CM | POA: Diagnosis not present

## 2018-07-31 DIAGNOSIS — E039 Hypothyroidism, unspecified: Secondary | ICD-10-CM | POA: Diagnosis not present

## 2018-07-31 DIAGNOSIS — K219 Gastro-esophageal reflux disease without esophagitis: Secondary | ICD-10-CM | POA: Diagnosis not present

## 2018-07-31 DIAGNOSIS — E1142 Type 2 diabetes mellitus with diabetic polyneuropathy: Secondary | ICD-10-CM | POA: Diagnosis not present

## 2018-07-31 DIAGNOSIS — E1151 Type 2 diabetes mellitus with diabetic peripheral angiopathy without gangrene: Secondary | ICD-10-CM | POA: Diagnosis not present

## 2018-07-31 DIAGNOSIS — N184 Chronic kidney disease, stage 4 (severe): Secondary | ICD-10-CM | POA: Diagnosis not present

## 2018-07-31 DIAGNOSIS — Z9181 History of falling: Secondary | ICD-10-CM | POA: Diagnosis not present

## 2018-08-02 DIAGNOSIS — E1151 Type 2 diabetes mellitus with diabetic peripheral angiopathy without gangrene: Secondary | ICD-10-CM | POA: Diagnosis not present

## 2018-08-02 DIAGNOSIS — G4730 Sleep apnea, unspecified: Secondary | ICD-10-CM | POA: Diagnosis not present

## 2018-08-02 DIAGNOSIS — M4800 Spinal stenosis, site unspecified: Secondary | ICD-10-CM | POA: Diagnosis not present

## 2018-08-02 DIAGNOSIS — I509 Heart failure, unspecified: Secondary | ICD-10-CM | POA: Diagnosis not present

## 2018-08-02 DIAGNOSIS — I129 Hypertensive chronic kidney disease with stage 1 through stage 4 chronic kidney disease, or unspecified chronic kidney disease: Secondary | ICD-10-CM | POA: Diagnosis not present

## 2018-08-02 DIAGNOSIS — I130 Hypertensive heart and chronic kidney disease with heart failure and stage 1 through stage 4 chronic kidney disease, or unspecified chronic kidney disease: Secondary | ICD-10-CM | POA: Diagnosis not present

## 2018-08-02 DIAGNOSIS — M170 Bilateral primary osteoarthritis of knee: Secondary | ICD-10-CM | POA: Diagnosis not present

## 2018-08-02 DIAGNOSIS — I480 Paroxysmal atrial fibrillation: Secondary | ICD-10-CM | POA: Diagnosis not present

## 2018-08-02 DIAGNOSIS — Z9181 History of falling: Secondary | ICD-10-CM | POA: Diagnosis not present

## 2018-08-02 DIAGNOSIS — E1140 Type 2 diabetes mellitus with diabetic neuropathy, unspecified: Secondary | ICD-10-CM | POA: Diagnosis not present

## 2018-08-02 DIAGNOSIS — Z794 Long term (current) use of insulin: Secondary | ICD-10-CM | POA: Diagnosis not present

## 2018-08-02 DIAGNOSIS — N183 Chronic kidney disease, stage 3 (moderate): Secondary | ICD-10-CM | POA: Diagnosis not present

## 2018-08-02 DIAGNOSIS — Z7982 Long term (current) use of aspirin: Secondary | ICD-10-CM | POA: Diagnosis not present

## 2018-08-02 DIAGNOSIS — K219 Gastro-esophageal reflux disease without esophagitis: Secondary | ICD-10-CM | POA: Diagnosis not present

## 2018-08-02 DIAGNOSIS — I4891 Unspecified atrial fibrillation: Secondary | ICD-10-CM | POA: Diagnosis not present

## 2018-08-02 DIAGNOSIS — Z8673 Personal history of transient ischemic attack (TIA), and cerebral infarction without residual deficits: Secondary | ICD-10-CM | POA: Diagnosis not present

## 2018-08-02 DIAGNOSIS — E039 Hypothyroidism, unspecified: Secondary | ICD-10-CM | POA: Diagnosis not present

## 2018-08-02 DIAGNOSIS — E1122 Type 2 diabetes mellitus with diabetic chronic kidney disease: Secondary | ICD-10-CM | POA: Diagnosis not present

## 2018-08-02 DIAGNOSIS — G894 Chronic pain syndrome: Secondary | ICD-10-CM | POA: Diagnosis not present

## 2018-08-02 DIAGNOSIS — I2510 Atherosclerotic heart disease of native coronary artery without angina pectoris: Secondary | ICD-10-CM | POA: Diagnosis not present

## 2018-08-02 DIAGNOSIS — N184 Chronic kidney disease, stage 4 (severe): Secondary | ICD-10-CM | POA: Diagnosis not present

## 2018-08-02 DIAGNOSIS — E1142 Type 2 diabetes mellitus with diabetic polyneuropathy: Secondary | ICD-10-CM | POA: Diagnosis not present

## 2018-08-03 ENCOUNTER — Ambulatory Visit (INDEPENDENT_AMBULATORY_CARE_PROVIDER_SITE_OTHER): Payer: Medicare Other | Admitting: Family Medicine

## 2018-08-03 ENCOUNTER — Other Ambulatory Visit: Payer: Self-pay | Admitting: *Deleted

## 2018-08-03 ENCOUNTER — Encounter: Payer: Self-pay | Admitting: Family Medicine

## 2018-08-03 VITALS — BP 140/60 | HR 59 | Temp 97.8°F | Ht 63.0 in | Wt 141.5 lb

## 2018-08-03 DIAGNOSIS — IMO0002 Reserved for concepts with insufficient information to code with codable children: Secondary | ICD-10-CM

## 2018-08-03 DIAGNOSIS — E1165 Type 2 diabetes mellitus with hyperglycemia: Secondary | ICD-10-CM | POA: Diagnosis not present

## 2018-08-03 DIAGNOSIS — I130 Hypertensive heart and chronic kidney disease with heart failure and stage 1 through stage 4 chronic kidney disease, or unspecified chronic kidney disease: Secondary | ICD-10-CM | POA: Diagnosis not present

## 2018-08-03 DIAGNOSIS — I10 Essential (primary) hypertension: Secondary | ICD-10-CM

## 2018-08-03 DIAGNOSIS — I509 Heart failure, unspecified: Secondary | ICD-10-CM | POA: Diagnosis not present

## 2018-08-03 DIAGNOSIS — E1122 Type 2 diabetes mellitus with diabetic chronic kidney disease: Secondary | ICD-10-CM | POA: Diagnosis not present

## 2018-08-03 DIAGNOSIS — M170 Bilateral primary osteoarthritis of knee: Secondary | ICD-10-CM | POA: Diagnosis not present

## 2018-08-03 DIAGNOSIS — E1142 Type 2 diabetes mellitus with diabetic polyneuropathy: Secondary | ICD-10-CM | POA: Diagnosis not present

## 2018-08-03 DIAGNOSIS — N183 Chronic kidney disease, stage 3 unspecified: Secondary | ICD-10-CM

## 2018-08-03 DIAGNOSIS — I480 Paroxysmal atrial fibrillation: Secondary | ICD-10-CM | POA: Diagnosis not present

## 2018-08-03 DIAGNOSIS — F411 Generalized anxiety disorder: Secondary | ICD-10-CM

## 2018-08-03 DIAGNOSIS — Z9181 History of falling: Secondary | ICD-10-CM | POA: Diagnosis not present

## 2018-08-03 DIAGNOSIS — Z794 Long term (current) use of insulin: Secondary | ICD-10-CM | POA: Diagnosis not present

## 2018-08-03 DIAGNOSIS — G894 Chronic pain syndrome: Secondary | ICD-10-CM | POA: Diagnosis not present

## 2018-08-03 DIAGNOSIS — Z8673 Personal history of transient ischemic attack (TIA), and cerebral infarction without residual deficits: Secondary | ICD-10-CM | POA: Diagnosis not present

## 2018-08-03 DIAGNOSIS — E039 Hypothyroidism, unspecified: Secondary | ICD-10-CM | POA: Diagnosis not present

## 2018-08-03 DIAGNOSIS — E875 Hyperkalemia: Secondary | ICD-10-CM

## 2018-08-03 DIAGNOSIS — N184 Chronic kidney disease, stage 4 (severe): Secondary | ICD-10-CM | POA: Diagnosis not present

## 2018-08-03 DIAGNOSIS — G4730 Sleep apnea, unspecified: Secondary | ICD-10-CM | POA: Diagnosis not present

## 2018-08-03 DIAGNOSIS — M4800 Spinal stenosis, site unspecified: Secondary | ICD-10-CM | POA: Diagnosis not present

## 2018-08-03 DIAGNOSIS — F419 Anxiety disorder, unspecified: Secondary | ICD-10-CM

## 2018-08-03 DIAGNOSIS — K219 Gastro-esophageal reflux disease without esophagitis: Secondary | ICD-10-CM | POA: Diagnosis not present

## 2018-08-03 DIAGNOSIS — N182 Chronic kidney disease, stage 2 (mild): Secondary | ICD-10-CM | POA: Diagnosis not present

## 2018-08-03 DIAGNOSIS — E1151 Type 2 diabetes mellitus with diabetic peripheral angiopathy without gangrene: Secondary | ICD-10-CM | POA: Diagnosis not present

## 2018-08-03 DIAGNOSIS — I2510 Atherosclerotic heart disease of native coronary artery without angina pectoris: Secondary | ICD-10-CM | POA: Diagnosis not present

## 2018-08-03 DIAGNOSIS — E1143 Type 2 diabetes mellitus with diabetic autonomic (poly)neuropathy: Secondary | ICD-10-CM

## 2018-08-03 MED ORDER — ALPRAZOLAM 1 MG PO TABS: ORAL_TABLET | ORAL | 0 refills | Status: DC

## 2018-08-03 NOTE — Assessment & Plan Note (Signed)
Worsened control at admission.Marland Kitchen re-eval today.

## 2018-08-03 NOTE — Patient Instructions (Addendum)
Please stop at the lab to have labs drawn. Please stop at the front desk to set up referral.

## 2018-08-03 NOTE — Assessment & Plan Note (Signed)
Worsened control per pt report.  Eval with A1C.  On insulin.  Refer to ENDO given hyperglycemia as well as month CBGs< 60.

## 2018-08-03 NOTE — Assessment & Plan Note (Signed)
Stable control on alprazolam, pt unable and unwilling to wean of. Has been on it for years.

## 2018-08-03 NOTE — Assessment & Plan Note (Signed)
Due for re-eval. 

## 2018-08-03 NOTE — Progress Notes (Signed)
   Subjective:    Patient ID: Karen [name], female    DOB: October 29, 1923, 85 y.o.   MRN: [id number]  HPI   85 year old female pt presents for hospital follow up.  She was living at home and became unable to care for herself.  She was seen  In ER on 11/26, she was then admitted to [facility] place.  Having foul smelling urine and pain in right hip after a fall. Home in last 2 weeks.   Labs showed mild anemia at 10.8  UA negative Cr 1.8 Potassium 5.2 CT head: neg  glucose 282   Given weakness and dehabilitation... recommended SNF admission.  Getting OT, PT 2 times a week  Using a walker.  Unstable on feet.  Has hospital bed and a wheel chair to use.   On insulin lantus 42 units daily Humalog: Inj 10u before breakfast, 14u before lunch, 14u before supper. May use up to 3 more units/meal prn per sliding scale max units 45.  She reports since she has been home before dinner 250-260.  FBS 100-125  Last night she had sugar last night 40s ( occurring 1 once a month  Ensure for breakfast.  Blood pressure 140/60, pulse (!) 59, temperature 97.8 F (36.6 C), temperature source Oral, height 5\' 3"  (1.6 m), weight 141 lb 8 oz (64.2 kg).  Social History /Family History/Past Medical History reviewed in detail and updated in EMR if needed.   . Review of Systems  Constitutional: Positive for fatigue. Negative for fever.  HENT: Negative for ear pain.   Eyes: Negative for pain.  Respiratory: Negative for cough and shortness of breath.   Cardiovascular: Positive for leg swelling. Negative for chest pain.  Gastrointestinal: Negative for abdominal pain.  Genitourinary: Negative for dysuria.  Musculoskeletal: Positive for back pain.       Objective:   Physical Exam Constitutional:      Appearance: She is normal weight.  HENT:     Right Ear: Tympanic membrane normal.     Left Ear: Tympanic membrane normal.     Nose: Nose normal.     Mouth/Throat:     Mouth: Mucous membranes are  moist.  Neck:     Musculoskeletal: Normal range of motion.  Cardiovascular:     Rate and Rhythm: Normal rate.     Pulses: Normal pulses.  Pulmonary:     Effort: Pulmonary effort is normal.     Breath sounds: Normal breath sounds. No rhonchi.  Abdominal:     General: Bowel sounds are normal. There is no distension.     Palpations: Abdomen is soft.     Tenderness: There is no abdominal tenderness.  Musculoskeletal:        General: Tenderness present.     Right lower leg: Edema present.     Left lower leg: Edema present.  Skin:    General: Skin is warm and dry.     Coloration: Skin is not pale.     Findings: No rash.  Neurological:     General: No focal deficit present.     Mental Status: She is alert and oriented to person, place, and time.           Assessment & Plan:

## 2018-08-03 NOTE — Assessment & Plan Note (Signed)
Well controlled. Continue current medication.  

## 2018-08-03 NOTE — Patient Outreach (Signed)
[facility]) Care Management [place], Transition of Care day 15 Post- SNF discharge day # 18  08/03/2018  Karen [name] [birth year] [id number]  [place], 85 y/o female referred to [place] by ED RN CM after recent ED visit 06/26/18 for generalized weakness, dehydration, and fall. Apparently, patient was discharged from ED to SNF for rehabilitation; this information was not provided at time of referral receipt.Patient has history including, but not limited to, HTN/ HLD; CKD- stage III; OSA; GERD; recent falls; DM- type II with neuropathy; pA-Fib; previous TIA/ CVA; and anxiety.HIPAA/ identity verified with patient during phone call today.  Today, patient reports that she is "doing fair; about the same," and she denies new/ recent falls post- SNF discharge; reports she continues using cane/ walker and wheelchair "all the time." Patient sounds to be in no distress throughout phone call today.  Patient further reports:  Medications: -- Has all medicationsand takes as prescribed;denies questions/ concernsor recent changes aroundcurrent medications -- continues self-managing medications using weekly pill planner box, with assistance form family as needed  Home health [facility]) services: --[place] services for PT, OT, RN, [facility]; recent visits at home were reviewed with patient and she confirms that she is expecting a visit form the home health team this morning; again encouraged patient to actively participate in a [facility] disciplines as ordered post SNF- discharge  Provider appointments: -- All upcoming provider appointments were reviewed with patient today; patient confirms that she has scheduled post- SNF discharge office visit with PCP this afternoon and reports her family will provide transportation and attend appointment with her  [place] needs: -- confirms that she has spoken to [place] around her request for Meals on Wheels; encouraged her to maintain contact with [facility] CSW -- again reports that the home health CSW is helping her with resources around in-home care; states she has not yet heard back form home health CSW, but expects/ hopes to talk to CSW again "soon."  Encouraged patient to discuss when she can expect to hear back from home health CSW with home health team; patient stated she would do -- continues to report supportive and actively involved family network; local son and daughter continue visiting her regularly and provide transportation for patient to all provider appointments, errands, etc  Patient denies further issues, concerns, or problems today. Iconfirmed that patient hasmy direct phone number, the main [facility] CM office phone number, and the [facility] CM 24-hour nurse advice phone number should issues arise prior to next scheduled [place] outreach. Encouraged patient to contact me directly if needs, questions, issues, or concerns arise prior to next scheduled outreach; patient agreed to do so.  Plan:  Patient will take medications as prescribed and will attend all scheduled provider appointments  Patient will promptly notify care providers for any new concerns/ issues/ problems that arise  Patient will actively participate in home health services as ordered post-hospital discharge  Patient will continueusing assistive devices for ambulation  Patient will maintain communication with [place] CSW for community resources around nutrition/ meals on wheels/ possible need for assistance around in home personal care assistance resources  [place] outreach for ongoing transition of care to continue with scheduled phone call next week  [facility] CM Care Plan Problem One     Most Recent Value  Care Plan Problem One  High risk for hospital readmission, as evidenced by patient  reporting  and recent ED visit and subsequent SNF rehabilitation visit with multiple falls reported by patient in 2019   Role Documenting the Problem One  Care Management [place] for Problem One  Active  THN Long Term Goal   Over the next 31 days, patient will not experience hospital readmission, as evidenced by patient reporting and review of EMR during [place] CM outreach  [facility] Long Term Goal Start Date  07/20/18  Interventions for Problem One Long Term Goal  Discussed with patient current clinical condition and confirmed that patient does not have current concerns aorund her condition  [facility] CM Short Term Goal #1   Over the next 30 days, patient will actively participate in home health services as ordered post- SNF, as evidenced by patient reporting and collaboration with home health team as indicated during [place] CCM outreach  [facility] CM Short Term Goal #1 Start Date  07/20/18  Interventions for Short Term Goal #1  Confirmed that home health team continues following patient,  reviewed recent and upcoming home health visits,  confirmed that patient continues actively participating in home health services and expects a visit this morning form home health team  [facility] CM Short Term Goal #2   Over the next 30 days, patient will attend all scheduled provider appointments as evidenced by patient reporting/ provider collaboration as indicated, and review of EMR during [facility] RN CCM outreach  [facility] CM Short Term Goal #2 Start Date  07/20/18  Interventions for Short Term Goal #2  Confirmed that patient is aware of and plans to attend today's scheduled PCP office visit,  confirmed that family will provide transportation and attend appointment with patient    [facility] CM Care Plan Problem Two     Most Recent Value  Care Plan Problem Two  High fall risk as evidenced by multiple recent falls reported by patient with subsequent ED visits/ SNF admission for rehabilitation  Role Documenting the Problem Two   Care Management Coordinator  Care Plan for Problem Two  Active  Interventions for Problem Two Long Term Goal   Confirmed that patient continues using assistive devices and that she has experienced no new/ recent falls post- SNF discharge,  fall risks/ prevention education reiterated with patient today  THN Long Term Goal  Over the next 45 days patient will use assistive devices for ambulation, as evidenced by patient reporting during [facility] RN CCM outreach  [facility] Long Term Goal Start Date  07/20/18     Oneta Rack, RN, BSN, [employer] Coordinator [facility] Care Management  [phone]

## 2018-08-04 LAB — CBC WITH DIFFERENTIAL/PLATELET
Absolute Monocytes: 1012 cells/uL — ABNORMAL HIGH (ref 200–950)
BASOS ABS: 44 {cells}/uL (ref 0–200)
BASOS PCT: 0.4 %
EOS ABS: 209 {cells}/uL (ref 15–500)
EOS PCT: 1.9 %
HCT: 36 % (ref 35.0–45.0)
Hemoglobin: 11.7 g/dL (ref 11.7–15.5)
Lymphs Abs: 2585 cells/uL (ref 850–3900)
MCH: 30.9 pg (ref 27.0–33.0)
MCHC: 32.5 g/dL (ref 32.0–36.0)
MCV: 95 fL (ref 80.0–100.0)
MONOS PCT: 9.2 %
MPV: 11.1 fL (ref 7.5–12.5)
Neutro Abs: 7150 cells/uL (ref 1500–7800)
Neutrophils Relative %: 65 %
Platelets: 288 10*3/uL (ref 140–400)
RBC: 3.79 10*6/uL — ABNORMAL LOW (ref 3.80–5.10)
RDW: 11.8 % (ref 11.0–15.0)
Total Lymphocyte: 23.5 %
WBC: 11 10*3/uL — ABNORMAL HIGH (ref 3.8–10.8)

## 2018-08-04 LAB — COMPREHENSIVE METABOLIC PANEL
AG RATIO: 1.7 (calc) (ref 1.0–2.5)
ALT: 15 U/L (ref 6–29)
AST: 19 U/L (ref 10–35)
Albumin: 4.1 g/dL (ref 3.6–5.1)
Alkaline phosphatase (APISO): 121 U/L (ref 33–130)
BUN / CREAT RATIO: 21 (calc) (ref 6–22)
BUN: 45 mg/dL — ABNORMAL HIGH (ref 7–25)
CALCIUM: 8.5 mg/dL — AB (ref 8.6–10.4)
CO2: 30 mmol/L (ref 20–32)
CREATININE: 2.12 mg/dL — AB (ref 0.60–0.88)
Chloride: 102 mmol/L (ref 98–110)
GLOBULIN: 2.4 g/dL (ref 1.9–3.7)
GLUCOSE: 65 mg/dL (ref 65–99)
Potassium: 4.7 mmol/L (ref 3.5–5.3)
Sodium: 141 mmol/L (ref 135–146)
Total Bilirubin: 0.4 mg/dL (ref 0.2–1.2)
Total Protein: 6.5 g/dL (ref 6.1–8.1)

## 2018-08-04 LAB — HEMOGLOBIN A1C
Hgb A1c MFr Bld: 6.6 % of total Hgb — ABNORMAL HIGH (ref <5.7)
Mean Plasma Glucose: 143 (calc)
eAG (mmol/L): 7.9 (calc)

## 2018-08-06 ENCOUNTER — Other Ambulatory Visit: Payer: Self-pay | Admitting: *Deleted

## 2018-08-06 DIAGNOSIS — N184 Chronic kidney disease, stage 4 (severe): Secondary | ICD-10-CM | POA: Diagnosis not present

## 2018-08-06 DIAGNOSIS — Z9181 History of falling: Secondary | ICD-10-CM | POA: Diagnosis not present

## 2018-08-06 DIAGNOSIS — K219 Gastro-esophageal reflux disease without esophagitis: Secondary | ICD-10-CM | POA: Diagnosis not present

## 2018-08-06 DIAGNOSIS — M4800 Spinal stenosis, site unspecified: Secondary | ICD-10-CM | POA: Diagnosis not present

## 2018-08-06 DIAGNOSIS — E039 Hypothyroidism, unspecified: Secondary | ICD-10-CM | POA: Diagnosis not present

## 2018-08-06 DIAGNOSIS — G4730 Sleep apnea, unspecified: Secondary | ICD-10-CM | POA: Diagnosis not present

## 2018-08-06 DIAGNOSIS — I2510 Atherosclerotic heart disease of native coronary artery without angina pectoris: Secondary | ICD-10-CM | POA: Diagnosis not present

## 2018-08-06 DIAGNOSIS — E1122 Type 2 diabetes mellitus with diabetic chronic kidney disease: Secondary | ICD-10-CM | POA: Diagnosis not present

## 2018-08-06 DIAGNOSIS — Z8673 Personal history of transient ischemic attack (TIA), and cerebral infarction without residual deficits: Secondary | ICD-10-CM | POA: Diagnosis not present

## 2018-08-06 DIAGNOSIS — I130 Hypertensive heart and chronic kidney disease with heart failure and stage 1 through stage 4 chronic kidney disease, or unspecified chronic kidney disease: Secondary | ICD-10-CM | POA: Diagnosis not present

## 2018-08-06 DIAGNOSIS — Z794 Long term (current) use of insulin: Secondary | ICD-10-CM | POA: Diagnosis not present

## 2018-08-06 DIAGNOSIS — I509 Heart failure, unspecified: Secondary | ICD-10-CM | POA: Diagnosis not present

## 2018-08-06 DIAGNOSIS — E1142 Type 2 diabetes mellitus with diabetic polyneuropathy: Secondary | ICD-10-CM | POA: Diagnosis not present

## 2018-08-06 DIAGNOSIS — I480 Paroxysmal atrial fibrillation: Secondary | ICD-10-CM | POA: Diagnosis not present

## 2018-08-06 DIAGNOSIS — E1151 Type 2 diabetes mellitus with diabetic peripheral angiopathy without gangrene: Secondary | ICD-10-CM | POA: Diagnosis not present

## 2018-08-06 DIAGNOSIS — G894 Chronic pain syndrome: Secondary | ICD-10-CM | POA: Diagnosis not present

## 2018-08-06 DIAGNOSIS — M170 Bilateral primary osteoarthritis of knee: Secondary | ICD-10-CM | POA: Diagnosis not present

## 2018-08-06 MED ORDER — INSULIN PEN NEEDLE 31G X 8 MM MISC: 11 refills | Status: AC

## 2018-08-08 ENCOUNTER — Encounter: Payer: Self-pay | Admitting: *Deleted

## 2018-08-08 ENCOUNTER — Other Ambulatory Visit: Payer: Self-pay | Admitting: *Deleted

## 2018-08-08 DIAGNOSIS — M170 Bilateral primary osteoarthritis of knee: Secondary | ICD-10-CM | POA: Diagnosis not present

## 2018-08-08 DIAGNOSIS — E1122 Type 2 diabetes mellitus with diabetic chronic kidney disease: Secondary | ICD-10-CM | POA: Diagnosis not present

## 2018-08-08 DIAGNOSIS — N184 Chronic kidney disease, stage 4 (severe): Secondary | ICD-10-CM | POA: Diagnosis not present

## 2018-08-08 DIAGNOSIS — K219 Gastro-esophageal reflux disease without esophagitis: Secondary | ICD-10-CM | POA: Diagnosis not present

## 2018-08-08 DIAGNOSIS — E1142 Type 2 diabetes mellitus with diabetic polyneuropathy: Secondary | ICD-10-CM | POA: Diagnosis not present

## 2018-08-08 DIAGNOSIS — G894 Chronic pain syndrome: Secondary | ICD-10-CM | POA: Diagnosis not present

## 2018-08-08 DIAGNOSIS — I509 Heart failure, unspecified: Secondary | ICD-10-CM | POA: Diagnosis not present

## 2018-08-08 DIAGNOSIS — Z9181 History of falling: Secondary | ICD-10-CM | POA: Diagnosis not present

## 2018-08-08 DIAGNOSIS — I480 Paroxysmal atrial fibrillation: Secondary | ICD-10-CM | POA: Diagnosis not present

## 2018-08-08 DIAGNOSIS — E039 Hypothyroidism, unspecified: Secondary | ICD-10-CM | POA: Diagnosis not present

## 2018-08-08 DIAGNOSIS — Z794 Long term (current) use of insulin: Secondary | ICD-10-CM | POA: Diagnosis not present

## 2018-08-08 DIAGNOSIS — I130 Hypertensive heart and chronic kidney disease with heart failure and stage 1 through stage 4 chronic kidney disease, or unspecified chronic kidney disease: Secondary | ICD-10-CM | POA: Diagnosis not present

## 2018-08-08 DIAGNOSIS — I2510 Atherosclerotic heart disease of native coronary artery without angina pectoris: Secondary | ICD-10-CM | POA: Diagnosis not present

## 2018-08-08 DIAGNOSIS — G4730 Sleep apnea, unspecified: Secondary | ICD-10-CM | POA: Diagnosis not present

## 2018-08-08 DIAGNOSIS — M4800 Spinal stenosis, site unspecified: Secondary | ICD-10-CM | POA: Diagnosis not present

## 2018-08-08 DIAGNOSIS — E1151 Type 2 diabetes mellitus with diabetic peripheral angiopathy without gangrene: Secondary | ICD-10-CM | POA: Diagnosis not present

## 2018-08-08 DIAGNOSIS — Z8673 Personal history of transient ischemic attack (TIA), and cerebral infarction without residual deficits: Secondary | ICD-10-CM | POA: Diagnosis not present

## 2018-08-08 NOTE — Patient Outreach (Signed)
[facility]) Care Management [place], Transition of Care day 20 Post SNF discharge day # 23  08/08/2018  Karen [name] November 27, 1923 [id number]  10:45 am: Successfultelephone outreachtoKatherine Alroy [name], 85 y/o female referred to [place] by ED RN CM after recent ED visit 06/26/18 for generalized weakness, dehydration, and fall. Apparently, patient was discharged from ED to SNF for rehabilitation; this information was not provided at time of referral receipt.Patient has history including, but not limited to, HTN/ HLD; CKD- stage III; OSA; GERD; recent falls; DM- type II with neuropathy; pA-Fib; previous TIA/ CVA; and anxiety.HIPAA/ identity verified with patient during phone call today; patient reports that home health OT is currently at her home; requests call back in 30 minutes; agreed to re-attempt call to patient as requested.  11:35 am:  Returned patient's call as she requested earlier; HIPAA/ identity verified.  Today, patient reports that she is "doingfair; about the same." States home health OT visit this morning "went fine;" and she reports that home health PT [name] be visiting her at home tomorrow.  Patient reports ongoing chronic pain "without changes, same as always."  Unfortunately, patient reports new fall on Sunday 08/05/2018 at home- states that she awoke from sleep in the early morning hours and "had an unusually full bladder;" reports that she was incontinent at home, and upon attempting to get out of bed, "slowly slid down on the floor," as her foot "slipped on the floor that had a puddle of urine on it."  Patient denies injury from this fall, however, states that she used her life-alert to contact EMS, and once EMS team arrived, "they had to break the window on the door to get in."  Reports that her son also arrived moments later, and that she "was fine" after EMS/ family assisted her off floor. Patient stated that at  time of fall, EMS team informed her that her blood sugar "was low- it was 69;" and states that she promptly ate something "which helped it come up."  Given patient's newly reported fall, we again discussed fall risks/ prevention; patient endorses consistent use of assistive devices and constant wearing of life-alert system; states she "couldn't help it" that her "bladder was so full," and that she "didn't mean to fall."  Discussed options for patient's living situation to ensure her safety, including possibility of assisted living facility resources; patient remains adamant today that this would be a "last resort" and that again confirms that she believes she is safe at her home, living alone, staying by herself overnight.  We thoroughly discussed how frequent falls can cause serious injury, and that being alone contributes to increased fall risks.  Continues to report supportive and actively involved family network;local son and daughter continue visiting and assisting her care needs regularly.  Patient sounds to be in no distress throughout phone call today.  Patient further reports:  Medications: -- Has all medicationsand takes as prescribed;denies questions/ concernsor recent changes aroundcurrent medications post-recent PCP appointment --continuesself-managingmedications using weekly pill planner box, with assistance formfamilyas needed  [facility]) services: --reportsongoing active participation [facility], [facility], [facility]; recent visits at home were reviewed with patient; encouraged her ongoing participation in all disciplines -- discussed with patient whether she has yet heard back from home health CSW, as she had previously reported that the [facility] CSW was assisting her around resource options available for in-home care assistance; patient again reports that  she has not yet heard back from [facility] CSW; I discussed with patient that [facility] CSW was  currently working on her request for resource options around nutrition (Meals on Wheels), and encouraged patient to confer with [place] team to obtain update on [facility] CSW efforts around in-home care assistance- patient reports that she expects home visit from [facility] PT/ RN later this week, and [name] follow up; encouraged patient to obtain name and contact information for [facility] CSW, and explained that [place] team would assist as needed in contacting [facility] CSW for update if patient was unsuccessful in obtaining follow up herself.  Patient verbalized that she preferred to follow up herself with [facility] team, but agreed to [place] assistance if she was unsuccessful.   Provider appointments: -- All recent and upcoming provider appointments were reviewed with patient today; patient confirms that she is aware of recent referral to endocrinology provider post- PCP appointment on 08/03/2018, and states that she does not know the endocrinology providers name, and is waiting for them to contact her for an appointment.  Patient declines my assistance in facilitating this, stating that she "feels sure" she [name] be contacted soon.  Shared with patient that I amble to facilitate this if needed.  Continues to report that her family [name] provide transportation and attend all provider appointments with her  Self-health management of chronic disease state of DM with recently reported episodes of hypoglycemia: -- as noted above, patient reports today that her blood sugars have been "running low," with several days of fasting blood sugars < 90; reported that at time of her recent fall, EMS team checked her blood sugar and it "was 69;" reports several days that her fasting blood sugars have been < 90; denies symptoms around these episodes; states that she "tries to eat a snack late at night," and to "eat as soon as possible after getting up in the morning."  Endorses "tries to eat healthy," but denies that she follows a strict diet for DM -- checks blood  sugars "at least twice a day" and reports fasting ranges between 88-110, with fasting blood sugar today of "99;" -- verbalizes accurate understand of current insulin dosing and scheduling -- verbalizes good understanding of significance of/ personal value of A1-C; states she was just contacted this morning with her A1-C result from PCP office visit 08/03/2018, of "6.6" -- using teach back method, provided education and discussed with patient signs/ symptoms hypoglycemia; discussed pathophysiology around low blood sugar and why low blood sugar [name] increase her risk of falling.  Discussed action plan for episodes of low blood sugar with patient and encouraged her to promptly review printed EMMI education material, which I [name] place in mail to her.  Encouraged patient to promptly notify care providers for ongoing low blood sugars, and she agrees to do so  Patient denies further issues, concerns, or problems today. Iconfirmed that patient hasmy direct phone number, the main [facility]N CM office phone number, and the [facility] CM 24-hour nurse advice phone number should issues arise prior to next scheduled [place] outreach next week for ongoing transition of care. Encouraged patient to contact me directly if needs, questions, issues, or concerns arise prior to next scheduled outreach; patient agreed to do so.  Plan:  Patient [name] take medications as prescribed and [name] attend all scheduled provider appointments  Patient [name] promptly notify care providers for any new concerns/ issues/ problems that arise  Patient [name] actively participate in home health services as ordered  post-hospital discharge  Patient [name] continueusing assistive devices for ambulation  Patient [name] maintain communication with [place] CSW for community resources around nutrition/ meals on wheels/ possible need for assistance around in home personal care assistance resources  I [name] send printed educational material  to patient regarding fall prevention and low blood sugar in setting of DM, and patient [name] promptly review  I [name] send today's [place] CM note and care plan to patient's PCP as initial assessment  [place] outreach for ongoing transition of care to continue with scheduled phone call next week  [facility] CM Care Plan Problem One     Most Recent Value  Care Plan Problem One  High risk for hospital readmission, as evidenced by patient reporting and recent ED visit and subsequent SNF rehabilitation visit with multiple falls reported by patient in 2019  Role Documenting the Problem One  Care Management Coordinator  Care Plan for Problem One  Active  THN Long Term Goal   Over the next 31 days, patient [name] not experience hospital readmission, as evidenced by patient reporting and review of EMR during [place] outreach  [facility] Long Term Goal Start Date  07/20/18  Interventions for Problem One Long Term Goal  Discussed with patient current clinical condition, and concerned that she has no current clinical concerns at time of call,  encouraged patient to promptly report any new issues, concerns, or problems to care providers  [facility] CM Short Term Goal #1   Over the next 30 days, patient [name] actively participate in home health services as ordered post- SNF, as evidenced by patient reporting and collaboration with home health team as indicated during [place] CCM outreach  [facility] CM Short Term Goal #1 Start Date  07/20/18  Interventions for Short Term Goal #1  Discussed recent and upcoming home health visits at patient's home,  encouraged patient to promptly initiate follow up conversation with home health team regarding home health CSW follow up around in-home care assistance,  confirmed that patient continues actively participating in home health services and has contact information for home health team  [facility] CM Short Term Goal #2   Over the next 30 days, patient [name] attend all scheduled provider  appointments as evidenced by patient reporting/ provider collaboration as indicated, and review of EMR during [facility] RN CCM outreach  [facility] CM Short Term Goal #2 Start Date  07/20/18  Interventions for Short Term Goal #2  Reviewed recent PCP office visit post-visit instructions with patient and confirmed that she understands post-office visit instructions,  confirmed that family [name] continue providing transportation to all scheduled provider office visits and [name] attend visits with patient,  confirmed that patient is plannning to attend endocrinology appointment and is waiting for appointment to be scheduled    [facility] CM Care Plan Problem Two     Most Recent Value  Care Plan Problem Two  High fall risk as evidenced by multiple recent falls reported by patient with subsequent ED visits/ SNF admission for rehabilitation  Role Documenting the Problem Two  Care Management Coordinator  Care Plan for Problem Two  Active  Interventions for Problem Two Long Term Goal   Discussed with patient her recent fall, reported today,  reiterated previously provided education around fall risks/ prevention,  placed printed EMMI educational material in mail to patient and discussed with patient action plan for falls,  confirmed that patient continues wearing and using life alert system and assistive devices,  discussed  with patient possibilities around assisted living options for her safety   THN Long Term Goal  Over the next 45 days patient [name] use assistive devices for ambulation, as evidenced by patient reporting during [facility] RN CCM outreach  [place] Term Goal Start Date  07/20/18    [facility] CM Care Plan Problem Three     Most Recent Value  Care Plan Problem Three  Self-health management of chronic disease state of DM, as evidenced by patient reporting  Role Documenting the Problem Three  Care Management Coordinator  Care Plan for Problem Three  Active  THN Long Term Goal   Over the next 45 days, patient [name] verbalize 3  signs/ symptoms of hypoglycemia along with action plan for same, as evidenced by patient reporting during [place] outreach  [place] Term Goal Start Date  08/08/18  Interventions for Problem Three Long Term Goal  Discussed with patient her recently reported episodes of hypoglycemia at home,  discussed how low blood sugar can lead to increased fall risks,  using teach back method, provided education around signs/ symptoms low sugar, along with action plan for same,  encouraged patient to review printed EMMI educational material placed in mail to her  [facility] CM Short Term Goal #1   Over the next 30 days, patient [name] schedule and attend recently referred endocrinology provider appointment, as evidenced by patient reporting, review of EMR, and collaboration with care providers as indicated during [place] outreach   [facility] CM Short Term Goal #1 Start Date  08/08/18  Interventions for Short Term Goal #1  Confirmed that patient is aware of and plans to attend recently referred endocrinology appointment,  confirmed that patient is waiting to hear from office staff to schedule office visit,  encouraged patient to promptly schedule this appointment, and encouraged her to contact me should she need assistance around this     Oneta Rack, RN, BSN, [employer] Coordinator [facility] Care Management  [phone]

## 2018-08-09 DIAGNOSIS — I480 Paroxysmal atrial fibrillation: Secondary | ICD-10-CM | POA: Diagnosis not present

## 2018-08-09 DIAGNOSIS — I509 Heart failure, unspecified: Secondary | ICD-10-CM | POA: Diagnosis not present

## 2018-08-09 DIAGNOSIS — K219 Gastro-esophageal reflux disease without esophagitis: Secondary | ICD-10-CM | POA: Diagnosis not present

## 2018-08-09 DIAGNOSIS — Z8673 Personal history of transient ischemic attack (TIA), and cerebral infarction without residual deficits: Secondary | ICD-10-CM | POA: Diagnosis not present

## 2018-08-09 DIAGNOSIS — I2510 Atherosclerotic heart disease of native coronary artery without angina pectoris: Secondary | ICD-10-CM | POA: Diagnosis not present

## 2018-08-09 DIAGNOSIS — Z9181 History of falling: Secondary | ICD-10-CM | POA: Diagnosis not present

## 2018-08-09 DIAGNOSIS — E039 Hypothyroidism, unspecified: Secondary | ICD-10-CM | POA: Diagnosis not present

## 2018-08-09 DIAGNOSIS — E1122 Type 2 diabetes mellitus with diabetic chronic kidney disease: Secondary | ICD-10-CM | POA: Diagnosis not present

## 2018-08-09 DIAGNOSIS — M170 Bilateral primary osteoarthritis of knee: Secondary | ICD-10-CM | POA: Diagnosis not present

## 2018-08-09 DIAGNOSIS — G4730 Sleep apnea, unspecified: Secondary | ICD-10-CM | POA: Diagnosis not present

## 2018-08-09 DIAGNOSIS — G894 Chronic pain syndrome: Secondary | ICD-10-CM | POA: Diagnosis not present

## 2018-08-09 DIAGNOSIS — M4800 Spinal stenosis, site unspecified: Secondary | ICD-10-CM | POA: Diagnosis not present

## 2018-08-09 DIAGNOSIS — Z794 Long term (current) use of insulin: Secondary | ICD-10-CM | POA: Diagnosis not present

## 2018-08-09 DIAGNOSIS — I130 Hypertensive heart and chronic kidney disease with heart failure and stage 1 through stage 4 chronic kidney disease, or unspecified chronic kidney disease: Secondary | ICD-10-CM | POA: Diagnosis not present

## 2018-08-09 DIAGNOSIS — E1142 Type 2 diabetes mellitus with diabetic polyneuropathy: Secondary | ICD-10-CM | POA: Diagnosis not present

## 2018-08-09 DIAGNOSIS — E1151 Type 2 diabetes mellitus with diabetic peripheral angiopathy without gangrene: Secondary | ICD-10-CM | POA: Diagnosis not present

## 2018-08-09 DIAGNOSIS — N184 Chronic kidney disease, stage 4 (severe): Secondary | ICD-10-CM | POA: Diagnosis not present

## 2018-08-10 NOTE — Progress Notes (Signed)
[name] call pt.. if she is continuing to have lows < 70 prior to getting in to see ENDO.Marland Kitchen Have her decrease Lantus to 35 Units daily  CC: Karen [name]

## 2018-08-13 ENCOUNTER — Other Ambulatory Visit: Payer: Self-pay | Admitting: *Deleted

## 2018-08-13 DIAGNOSIS — E1142 Type 2 diabetes mellitus with diabetic polyneuropathy: Secondary | ICD-10-CM | POA: Diagnosis not present

## 2018-08-13 DIAGNOSIS — Z8673 Personal history of transient ischemic attack (TIA), and cerebral infarction without residual deficits: Secondary | ICD-10-CM | POA: Diagnosis not present

## 2018-08-13 DIAGNOSIS — E1151 Type 2 diabetes mellitus with diabetic peripheral angiopathy without gangrene: Secondary | ICD-10-CM | POA: Diagnosis not present

## 2018-08-13 DIAGNOSIS — Z9181 History of falling: Secondary | ICD-10-CM | POA: Diagnosis not present

## 2018-08-13 DIAGNOSIS — M4800 Spinal stenosis, site unspecified: Secondary | ICD-10-CM | POA: Diagnosis not present

## 2018-08-13 DIAGNOSIS — I509 Heart failure, unspecified: Secondary | ICD-10-CM | POA: Diagnosis not present

## 2018-08-13 DIAGNOSIS — I480 Paroxysmal atrial fibrillation: Secondary | ICD-10-CM | POA: Diagnosis not present

## 2018-08-13 DIAGNOSIS — G894 Chronic pain syndrome: Secondary | ICD-10-CM | POA: Diagnosis not present

## 2018-08-13 DIAGNOSIS — I2510 Atherosclerotic heart disease of native coronary artery without angina pectoris: Secondary | ICD-10-CM | POA: Diagnosis not present

## 2018-08-13 DIAGNOSIS — I130 Hypertensive heart and chronic kidney disease with heart failure and stage 1 through stage 4 chronic kidney disease, or unspecified chronic kidney disease: Secondary | ICD-10-CM | POA: Diagnosis not present

## 2018-08-13 DIAGNOSIS — K219 Gastro-esophageal reflux disease without esophagitis: Secondary | ICD-10-CM | POA: Diagnosis not present

## 2018-08-13 DIAGNOSIS — M170 Bilateral primary osteoarthritis of knee: Secondary | ICD-10-CM | POA: Diagnosis not present

## 2018-08-13 DIAGNOSIS — G4730 Sleep apnea, unspecified: Secondary | ICD-10-CM | POA: Diagnosis not present

## 2018-08-13 DIAGNOSIS — N184 Chronic kidney disease, stage 4 (severe): Secondary | ICD-10-CM | POA: Diagnosis not present

## 2018-08-13 DIAGNOSIS — Z794 Long term (current) use of insulin: Secondary | ICD-10-CM | POA: Diagnosis not present

## 2018-08-13 DIAGNOSIS — E039 Hypothyroidism, unspecified: Secondary | ICD-10-CM | POA: Diagnosis not present

## 2018-08-13 DIAGNOSIS — E1122 Type 2 diabetes mellitus with diabetic chronic kidney disease: Secondary | ICD-10-CM | POA: Diagnosis not present

## 2018-08-13 NOTE — Patient Outreach (Signed)
[facility]) Care Management  08/13/2018  ZYLPHA POYNOR 1923-10-05 [id number]   It was confirmed by the [facility] referral site that patient has already been added to the waiting list for Meals on Wheels and will be notified once a space becomes available.  Phone call to patient to inform her of this.  Patient states that she slipped  and fell in the kitchen today. Per patient, her daughter in law who is a nurse helped her up. There was water on the floor of which she was not aware.  Per patient, she had her walker beside of her but a chair got in the way. Per patient, she has moved the chair. Patient reports no injury, however this social worker encouraged her to call her doctor if she starts to feel any increased pain. This Education officer, museum also encouraged her to use her walker when ambulating.  Patient denies having any additional community resource needs at this time. Her family and neighbor to continue to assist with meals until a space comes up for Meals on Wheels.  This Education officer, museum will sign off at this time.   Sheralyn Boatman [facility] Care Management [phone]

## 2018-08-14 ENCOUNTER — Other Ambulatory Visit: Payer: Self-pay | Admitting: *Deleted

## 2018-08-14 ENCOUNTER — Telehealth: Payer: Self-pay | Admitting: Family Medicine

## 2018-08-14 ENCOUNTER — Encounter: Payer: Self-pay | Admitting: *Deleted

## 2018-08-14 DIAGNOSIS — I130 Hypertensive heart and chronic kidney disease with heart failure and stage 1 through stage 4 chronic kidney disease, or unspecified chronic kidney disease: Secondary | ICD-10-CM | POA: Diagnosis not present

## 2018-08-14 DIAGNOSIS — M170 Bilateral primary osteoarthritis of knee: Secondary | ICD-10-CM | POA: Diagnosis not present

## 2018-08-14 DIAGNOSIS — I480 Paroxysmal atrial fibrillation: Secondary | ICD-10-CM | POA: Diagnosis not present

## 2018-08-14 DIAGNOSIS — Z9181 History of falling: Secondary | ICD-10-CM | POA: Diagnosis not present

## 2018-08-14 DIAGNOSIS — I2510 Atherosclerotic heart disease of native coronary artery without angina pectoris: Secondary | ICD-10-CM | POA: Diagnosis not present

## 2018-08-14 DIAGNOSIS — N184 Chronic kidney disease, stage 4 (severe): Secondary | ICD-10-CM | POA: Diagnosis not present

## 2018-08-14 DIAGNOSIS — I509 Heart failure, unspecified: Secondary | ICD-10-CM | POA: Diagnosis not present

## 2018-08-14 DIAGNOSIS — E039 Hypothyroidism, unspecified: Secondary | ICD-10-CM | POA: Diagnosis not present

## 2018-08-14 DIAGNOSIS — E1151 Type 2 diabetes mellitus with diabetic peripheral angiopathy without gangrene: Secondary | ICD-10-CM | POA: Diagnosis not present

## 2018-08-14 DIAGNOSIS — G4730 Sleep apnea, unspecified: Secondary | ICD-10-CM | POA: Diagnosis not present

## 2018-08-14 DIAGNOSIS — M4800 Spinal stenosis, site unspecified: Secondary | ICD-10-CM | POA: Diagnosis not present

## 2018-08-14 DIAGNOSIS — G894 Chronic pain syndrome: Secondary | ICD-10-CM | POA: Diagnosis not present

## 2018-08-14 DIAGNOSIS — Z794 Long term (current) use of insulin: Secondary | ICD-10-CM | POA: Diagnosis not present

## 2018-08-14 DIAGNOSIS — Z8673 Personal history of transient ischemic attack (TIA), and cerebral infarction without residual deficits: Secondary | ICD-10-CM | POA: Diagnosis not present

## 2018-08-14 DIAGNOSIS — E1142 Type 2 diabetes mellitus with diabetic polyneuropathy: Secondary | ICD-10-CM | POA: Diagnosis not present

## 2018-08-14 DIAGNOSIS — K219 Gastro-esophageal reflux disease without esophagitis: Secondary | ICD-10-CM | POA: Diagnosis not present

## 2018-08-14 DIAGNOSIS — E1122 Type 2 diabetes mellitus with diabetic chronic kidney disease: Secondary | ICD-10-CM | POA: Diagnosis not present

## 2018-08-14 NOTE — Telephone Encounter (Signed)
[place].. what is the status of this pt's ENDO referral?   [name]: please call pt and son... please make sure they have decreased Lantus  To 35 UNITS daily... have them call before the end of the week with fasting blood glucose and 2 hours after meals glucose levels on the 35 Units. Please also ask pt or son when her low blood sugars are occuring.. if she has skipped a meal, bedtime, morning ETC..  Thank you!

## 2018-08-14 NOTE — Patient Outreach (Signed)
[facility]) Care Management [place] Telephone Outreach, Transition of Care day 26 Post-SNF discharge day # 29 08/14/2018  Karen [name] September 19, 1923 [id number]  [place], 85 y/o female referred to [place] by ED RN CM after recent ED visit 06/26/18 for generalized weakness, dehydration, and fall. Patient was discharged from ED to SNF for rehabilitation. Patient has history including, but not limited to, HTN/ HLD; CKD- stage III; OSA; GERD; recent falls; DM- type II with neuropathy; pA-Fib; previous TIA/ CVA; and anxiety.HIPAA/ identity verified with patient during phone call today; patient reports that her son/ primary caregiver is present at her home at time of call, and she requests that I speak with him after she and I complete our conversation.  Today, patient reports that she is "doingabout the same."Patient again reports another fall "yesterday" due to "floor being wet from refrigerator."  Patient stated that she was in process of making lunch and did not realize the refrigerator had leaked out water on linoleum floor.  Denies injury with fall, stated that her daughter came and helped her up.  Patient sounds to be in no apparent distress throughout entirety of phone cal today.   Patient states today that she has continued to have "several low blood sugars during the day, some under 70;" reports general ranges during day time as "69-90," with nighttime rangeses reported as "150-200;" discussed with patient recommendations made by PCP after last [place] outreach- patient states she has not heard from any one at PCP office; I shared with her the recommendation made that patient decrease Lantus insulin to 35 U q HS (from 42 U q HS) for consistent blood sugars below 70.  Patient verbalizes understanding and is able to re-verbalize these instructions prior to hanging up.  Denies specific signs/ symptoms  hypoglycemia.  I then spoke with patient's caregiver son [name], whom patient provided request/ consent for me to speak with; HIPAA/ identity verified.  Discussed with caregiver today: -- multiple falls at home post- recent SNF discharge: discussed risk factors for falls, including new use of wheelchair in elderly patient that lives alone and manages all assistive devices without assistance; recent low blood sugars; ongoing use of high-risk medications, including xanax and narcotics -- home health services: provided number to home health agency, as caregiver reports he does not have; discussed possibility and process to have these services extended; encouraged caregiver to contact and communicate with home health team, as he reports he "has never spoken to any of them" -- need for caregivers to be contacted to schedule patient's appointments and to obtain changes to overall plan of care: patient stated that he plans to go to patient's PCP office and make this request -- blood sugar management at home: encouraged son to regularly review patient's blood sugars readings at home and shared with him new/ recent recommendations made by PCP after last [facility] CCM outreach that patient decrease Lantus insulin to 35 U q HS (from 42 U q HS) for consistent blood sugars below 70; discussed with caregiver signa/ symptoms and basic pathophysiology of low blood sugars, and how this can potentially increase patient's fall risk/ safety concerns at home living alone with long periods of no supervision -- discussed level of care needs in elderly patient who lives alone with high fall risk/ considerations around ALF/ SNF options; caregiver reports that he will begin considering, but knows that patient prefers to live in her own home by herself; discussed safety as a  priority and encouraged patient to consider all options.  Patient and caregiver denies further issues, concerns, or problems today. Iprovided/ confirmed that patient  (and now caregiver) both havemy direct phone number, the main [place] CM office phone number, and the [facility] CM 24-hour nurse advice phone number should issues arise prior to next scheduled [place] outreach next week for ongoing transition of care. Discussed with caregiver that I would place call to him next week as well, after I contact patient; he is agreeable to this.  Encouraged patient or caregiver to contact me directly if needs, questions, issues, or concerns arise prior to next scheduled outreach; both agreed to do so.  Plan:  Patient will take medications as prescribed and will attend all scheduled provider appointments  Patient will promptly notify care providers for any new concerns/ issues/ problems that arise  Patient will actively participate in home health services as ordered post-hospital discharge  Patient will continueusing assistive devices for ambulation  I will send printed educational material to patient regarding fall prevention and low blood sugar in setting of DM, and patient/ caregiver will promptly review  I will send today's [place] CM note and care plan to patient's PCP to update that patient has experienced another fall at home, as well as recently reported blood sugars  [facility] CM outreachfor ongoing transition of careto continue with scheduled phone call to patient and to caregiver next week  [facility] CM Care Plan Problem One     Most Recent Value  Care Plan Problem One  High risk for hospital readmission, as evidenced by patient reporting and recent ED visit and subsequent SNF rehabilitation visit with multiple falls reported by patient in 2019  Role Documenting the Problem One  Care Management Coordinator  Care Plan for Problem One  Active  [facility] Long Term Goal   Over the next 31 days, patient will not experience hospital readmission, as evidenced by patient reporting and review of EMR during [place] outreach  [facility] Long Term  Goal Start Date  07/20/18  Interventions for Problem One Long Term Goal  Discussed with patient and her caregiver/ son patient's current clinical condition,  discussed recent falls post- SNF discharge and safety concerns around multiple falls and episodes of low blood sugars post- SNF discharge,  reveiewed recent blood sugars with patient and sent update to patient's PCP  [facility] CM Short Term Goal #1   Over the next 30 days, patient will actively participate in home health services as ordered post- SNF, as evidenced by patient reporting and collaboration with home health team as indicated during [place] outreach  [facility] CM Short Term Goal #1 Start Date  07/20/18  Interventions for Short Term Goal #1  Confirmed that home health services remain active in patient's care at home post- SNF discharge,  provided telephone number for home health agency to patient's son/ caregiver,  discussed with caregiver process to have home health services extended  [facility] CM Short Term Goal #2   Over the next 30 days, patient will attend all scheduled provider appointments as evidenced by patient reporting/ provider collaboration as indicated, and review of EMR during [facility] RN CCM outreach  [facility] CM Short Term Goal #2 Start Date  07/20/18  Interventions for Short Term Goal #2  Discussed with patient/ caregiver recent referral to endocrinologist and confirmed that caregiver will follow up on referral to facilitate obtaining appointment    [facility] CM Care Plan Problem Two  Most Recent Value  Care Plan Problem Two  High fall risk as evidenced by multiple recent falls reported by patient with subsequent ED visits/ SNF admission for rehabilitation  Role Documenting the Problem Two  Care Management [place] for Problem Two  Active  Interventions for Problem Two Long Term Goal   Discussed safety concerns with patient and caregiver, along with reasons for patient having another recent fall at home post- SNF discharge,   discussed safe use of wheelchair and all assistive devices with caregiver and patient  [facility] Long Term Goal  Over the next 45 days patient will use assistive devices for ambulation, as evidenced by patient reporting during [facility] RN CCM outreach  [facility] Long Term Goal Start Date  07/20/18    [facility] CM Care Plan Problem Three     Most Recent Value  Care Plan Problem Three  Self-health management of chronic disease state of DM, as evidenced by patient reporting  Role Documenting the Problem Three  Care Management Coordinator  Care Plan for Problem Three  Active  [facility] Long Term Goal   Over the next 45 days, patient will verbalize 3 signs/ symptoms of hypoglycemia along with action plan for same, as evidenced by patient reporting during [place] outreach  [facility] Long Term Goal Start Date  08/08/18  Interventions for Problem Three Long Term Goal  Reviewed recent home blood sugars with patient and caregiver,  discussed pathophysiology of low blood sugar and it's potential role in increasing fall risks,  discussed recent recommendations from PCP around long-acting insulin for consistent blood sugars < 70 at home  [facility] CM Short Term Goal #1   Over the next 30 days, patient will schedule and attend recently referred endocrinology provider appointment, as evidenced by patient reporting, review of EMR, and collaboration with care providers as indicated during [facility] RN CCM outreach   [facility] CM Short Term Goal #1 Start Date  08/08/18  Interventions for Short Term Goal #1  Discussed with patient and caregiver recent referral and confirmed that patient's caregiver has name and phone number of endocrinology provider,  confirmed that caregiver will follow up on referral     Oneta Rack, RN, BSN, [place] Coordinator [facility] Care Management  [phone]

## 2018-08-14 NOTE — Telephone Encounter (Signed)
Karen [name]: can you up date me on what the status is of her ENDO referral? Thanks!

## 2018-08-14 NOTE — Telephone Encounter (Signed)
Spoke with Ms. Karen [name] and [name] (son).  They are now aware since speaking with [facility] that she is to decrease her Lantus dose down to 35 units.  They will check her FBS and 2HR PP and call us with numbers by the end of the week.  [name] states there is no particular time her low blood.   sugars occurs.  She states they just come out of no where.  [name] though the low blood sugars occur more in the morning.  He states sometime it is hard to get valid information out of his mom but he will help her set up new routine with insulin and checking her blood sugars.

## 2018-08-15 ENCOUNTER — Other Ambulatory Visit: Payer: Self-pay

## 2018-08-15 DIAGNOSIS — Z8673 Personal history of transient ischemic attack (TIA), and cerebral infarction without residual deficits: Secondary | ICD-10-CM | POA: Diagnosis not present

## 2018-08-15 DIAGNOSIS — M170 Bilateral primary osteoarthritis of knee: Secondary | ICD-10-CM | POA: Diagnosis not present

## 2018-08-15 DIAGNOSIS — Z9181 History of falling: Secondary | ICD-10-CM | POA: Diagnosis not present

## 2018-08-15 DIAGNOSIS — M4800 Spinal stenosis, site unspecified: Secondary | ICD-10-CM | POA: Diagnosis not present

## 2018-08-15 DIAGNOSIS — E1122 Type 2 diabetes mellitus with diabetic chronic kidney disease: Secondary | ICD-10-CM | POA: Diagnosis not present

## 2018-08-15 DIAGNOSIS — G894 Chronic pain syndrome: Secondary | ICD-10-CM

## 2018-08-15 DIAGNOSIS — I509 Heart failure, unspecified: Secondary | ICD-10-CM | POA: Diagnosis not present

## 2018-08-15 DIAGNOSIS — I480 Paroxysmal atrial fibrillation: Secondary | ICD-10-CM | POA: Diagnosis not present

## 2018-08-15 DIAGNOSIS — Z794 Long term (current) use of insulin: Secondary | ICD-10-CM | POA: Diagnosis not present

## 2018-08-15 DIAGNOSIS — E1151 Type 2 diabetes mellitus with diabetic peripheral angiopathy without gangrene: Secondary | ICD-10-CM | POA: Diagnosis not present

## 2018-08-15 DIAGNOSIS — I2510 Atherosclerotic heart disease of native coronary artery without angina pectoris: Secondary | ICD-10-CM | POA: Diagnosis not present

## 2018-08-15 DIAGNOSIS — E039 Hypothyroidism, unspecified: Secondary | ICD-10-CM | POA: Diagnosis not present

## 2018-08-15 DIAGNOSIS — G4730 Sleep apnea, unspecified: Secondary | ICD-10-CM | POA: Diagnosis not present

## 2018-08-15 DIAGNOSIS — E1142 Type 2 diabetes mellitus with diabetic polyneuropathy: Secondary | ICD-10-CM | POA: Diagnosis not present

## 2018-08-15 DIAGNOSIS — I130 Hypertensive heart and chronic kidney disease with heart failure and stage 1 through stage 4 chronic kidney disease, or unspecified chronic kidney disease: Secondary | ICD-10-CM | POA: Diagnosis not present

## 2018-08-15 DIAGNOSIS — N184 Chronic kidney disease, stage 4 (severe): Secondary | ICD-10-CM | POA: Diagnosis not present

## 2018-08-15 DIAGNOSIS — K219 Gastro-esophageal reflux disease without esophagitis: Secondary | ICD-10-CM | POA: Diagnosis not present

## 2018-08-15 NOTE — Telephone Encounter (Signed)
Patient apologizes for the late notice, she didn't realize we ask for 48-72 hours notice to fill R/X's.  She is out of medication and hopes this can be filled as soon as possible.   She does ask if [name] CMA can call her and let her know when this has been approved and sent in.  I will forward to [name] so that she can watch out for this.  Patient will give more notice in the future.

## 2018-08-15 NOTE — Telephone Encounter (Signed)
The Referral was just sent to Endo yesterday.They have to call the patient directly to schedule, they wont schedule the appointment with me over the phone.

## 2018-08-15 NOTE — Telephone Encounter (Signed)
Name of Medication: Hydrocodone apap 7.5-325 Name of Pharmacy: [facility] or Written Date and Quantity:# 120 x 2 on 04/19/18  Last Office Visit and Type:08/03/2018  Next Office Visit and Type: 11/02/18 for 3 mth FU Last Controlled Substance Agreement Date: 03/23/18 Last UDS:04/13/18

## 2018-08-16 DIAGNOSIS — G894 Chronic pain syndrome: Secondary | ICD-10-CM | POA: Diagnosis not present

## 2018-08-16 DIAGNOSIS — M179 Osteoarthritis of knee, unspecified: Secondary | ICD-10-CM | POA: Diagnosis not present

## 2018-08-16 DIAGNOSIS — K219 Gastro-esophageal reflux disease without esophagitis: Secondary | ICD-10-CM | POA: Diagnosis not present

## 2018-08-16 DIAGNOSIS — R2689 Other abnormalities of gait and mobility: Secondary | ICD-10-CM | POA: Diagnosis not present

## 2018-08-16 MED ORDER — HYDROCODONE-ACETAMINOPHEN 7.5-325 MG PO TABS: 1.0000 | ORAL_TABLET | Freq: Four times a day (QID) | ORAL | 0 refills | Status: DC

## 2018-08-16 MED ORDER — ONETOUCH ULTRA BLUE VI STRP: ORAL_STRIP | 6 refills | Status: AC

## 2018-08-16 NOTE — Telephone Encounter (Signed)
Notified patient that rx has been sent in.  She asks that I also order more Onetouch ultra test strips for her.  Testing strips ordered for patient per protocol.

## 2018-08-16 NOTE — Telephone Encounter (Signed)
Ok

## 2018-08-16 NOTE — Addendum Note (Signed)
Addended by: Magdalen Spatz C on: 08/16/2018 01:44 PM   Modules accepted: Orders

## 2018-08-17 DIAGNOSIS — Z794 Long term (current) use of insulin: Secondary | ICD-10-CM | POA: Diagnosis not present

## 2018-08-17 DIAGNOSIS — G894 Chronic pain syndrome: Secondary | ICD-10-CM | POA: Diagnosis not present

## 2018-08-17 DIAGNOSIS — N184 Chronic kidney disease, stage 4 (severe): Secondary | ICD-10-CM | POA: Diagnosis not present

## 2018-08-17 DIAGNOSIS — M170 Bilateral primary osteoarthritis of knee: Secondary | ICD-10-CM | POA: Diagnosis not present

## 2018-08-17 DIAGNOSIS — E1151 Type 2 diabetes mellitus with diabetic peripheral angiopathy without gangrene: Secondary | ICD-10-CM | POA: Diagnosis not present

## 2018-08-17 DIAGNOSIS — E1142 Type 2 diabetes mellitus with diabetic polyneuropathy: Secondary | ICD-10-CM | POA: Diagnosis not present

## 2018-08-17 DIAGNOSIS — I2510 Atherosclerotic heart disease of native coronary artery without angina pectoris: Secondary | ICD-10-CM | POA: Diagnosis not present

## 2018-08-17 DIAGNOSIS — I480 Paroxysmal atrial fibrillation: Secondary | ICD-10-CM | POA: Diagnosis not present

## 2018-08-17 DIAGNOSIS — G4730 Sleep apnea, unspecified: Secondary | ICD-10-CM | POA: Diagnosis not present

## 2018-08-17 DIAGNOSIS — Z8673 Personal history of transient ischemic attack (TIA), and cerebral infarction without residual deficits: Secondary | ICD-10-CM | POA: Diagnosis not present

## 2018-08-17 DIAGNOSIS — Z9181 History of falling: Secondary | ICD-10-CM | POA: Diagnosis not present

## 2018-08-17 DIAGNOSIS — K219 Gastro-esophageal reflux disease without esophagitis: Secondary | ICD-10-CM | POA: Diagnosis not present

## 2018-08-17 DIAGNOSIS — I509 Heart failure, unspecified: Secondary | ICD-10-CM | POA: Diagnosis not present

## 2018-08-17 DIAGNOSIS — E1122 Type 2 diabetes mellitus with diabetic chronic kidney disease: Secondary | ICD-10-CM | POA: Diagnosis not present

## 2018-08-17 DIAGNOSIS — M4800 Spinal stenosis, site unspecified: Secondary | ICD-10-CM | POA: Diagnosis not present

## 2018-08-17 DIAGNOSIS — I130 Hypertensive heart and chronic kidney disease with heart failure and stage 1 through stage 4 chronic kidney disease, or unspecified chronic kidney disease: Secondary | ICD-10-CM | POA: Diagnosis not present

## 2018-08-17 DIAGNOSIS — E039 Hypothyroidism, unspecified: Secondary | ICD-10-CM | POA: Diagnosis not present

## 2018-08-19 DIAGNOSIS — I2510 Atherosclerotic heart disease of native coronary artery without angina pectoris: Secondary | ICD-10-CM | POA: Diagnosis not present

## 2018-08-19 DIAGNOSIS — G4730 Sleep apnea, unspecified: Secondary | ICD-10-CM | POA: Diagnosis not present

## 2018-08-19 DIAGNOSIS — K219 Gastro-esophageal reflux disease without esophagitis: Secondary | ICD-10-CM | POA: Diagnosis not present

## 2018-08-19 DIAGNOSIS — M4800 Spinal stenosis, site unspecified: Secondary | ICD-10-CM | POA: Diagnosis not present

## 2018-08-19 DIAGNOSIS — G894 Chronic pain syndrome: Secondary | ICD-10-CM | POA: Diagnosis not present

## 2018-08-19 DIAGNOSIS — I509 Heart failure, unspecified: Secondary | ICD-10-CM | POA: Diagnosis not present

## 2018-08-19 DIAGNOSIS — N184 Chronic kidney disease, stage 4 (severe): Secondary | ICD-10-CM | POA: Diagnosis not present

## 2018-08-19 DIAGNOSIS — E039 Hypothyroidism, unspecified: Secondary | ICD-10-CM | POA: Diagnosis not present

## 2018-08-19 DIAGNOSIS — Z794 Long term (current) use of insulin: Secondary | ICD-10-CM | POA: Diagnosis not present

## 2018-08-19 DIAGNOSIS — E1122 Type 2 diabetes mellitus with diabetic chronic kidney disease: Secondary | ICD-10-CM | POA: Diagnosis not present

## 2018-08-19 DIAGNOSIS — I130 Hypertensive heart and chronic kidney disease with heart failure and stage 1 through stage 4 chronic kidney disease, or unspecified chronic kidney disease: Secondary | ICD-10-CM | POA: Diagnosis not present

## 2018-08-19 DIAGNOSIS — Z9181 History of falling: Secondary | ICD-10-CM | POA: Diagnosis not present

## 2018-08-19 DIAGNOSIS — M170 Bilateral primary osteoarthritis of knee: Secondary | ICD-10-CM | POA: Diagnosis not present

## 2018-08-19 DIAGNOSIS — E1151 Type 2 diabetes mellitus with diabetic peripheral angiopathy without gangrene: Secondary | ICD-10-CM | POA: Diagnosis not present

## 2018-08-19 DIAGNOSIS — E1142 Type 2 diabetes mellitus with diabetic polyneuropathy: Secondary | ICD-10-CM | POA: Diagnosis not present

## 2018-08-19 DIAGNOSIS — Z8673 Personal history of transient ischemic attack (TIA), and cerebral infarction without residual deficits: Secondary | ICD-10-CM | POA: Diagnosis not present

## 2018-08-19 DIAGNOSIS — I480 Paroxysmal atrial fibrillation: Secondary | ICD-10-CM | POA: Diagnosis not present

## 2018-08-20 ENCOUNTER — Encounter: Payer: Self-pay | Admitting: *Deleted

## 2018-08-20 ENCOUNTER — Other Ambulatory Visit: Payer: Self-pay | Admitting: *Deleted

## 2018-08-20 ENCOUNTER — Telehealth: Payer: Self-pay | Admitting: Family Medicine

## 2018-08-20 DIAGNOSIS — G4730 Sleep apnea, unspecified: Secondary | ICD-10-CM | POA: Diagnosis not present

## 2018-08-20 DIAGNOSIS — E1122 Type 2 diabetes mellitus with diabetic chronic kidney disease: Secondary | ICD-10-CM | POA: Diagnosis not present

## 2018-08-20 DIAGNOSIS — M170 Bilateral primary osteoarthritis of knee: Secondary | ICD-10-CM | POA: Diagnosis not present

## 2018-08-20 DIAGNOSIS — Z9181 History of falling: Secondary | ICD-10-CM | POA: Diagnosis not present

## 2018-08-20 DIAGNOSIS — N184 Chronic kidney disease, stage 4 (severe): Secondary | ICD-10-CM | POA: Diagnosis not present

## 2018-08-20 DIAGNOSIS — E1142 Type 2 diabetes mellitus with diabetic polyneuropathy: Secondary | ICD-10-CM | POA: Diagnosis not present

## 2018-08-20 DIAGNOSIS — E039 Hypothyroidism, unspecified: Secondary | ICD-10-CM | POA: Diagnosis not present

## 2018-08-20 DIAGNOSIS — G894 Chronic pain syndrome: Secondary | ICD-10-CM | POA: Diagnosis not present

## 2018-08-20 DIAGNOSIS — M4800 Spinal stenosis, site unspecified: Secondary | ICD-10-CM | POA: Diagnosis not present

## 2018-08-20 DIAGNOSIS — I2510 Atherosclerotic heart disease of native coronary artery without angina pectoris: Secondary | ICD-10-CM | POA: Diagnosis not present

## 2018-08-20 DIAGNOSIS — Z8673 Personal history of transient ischemic attack (TIA), and cerebral infarction without residual deficits: Secondary | ICD-10-CM | POA: Diagnosis not present

## 2018-08-20 DIAGNOSIS — I509 Heart failure, unspecified: Secondary | ICD-10-CM | POA: Diagnosis not present

## 2018-08-20 DIAGNOSIS — I480 Paroxysmal atrial fibrillation: Secondary | ICD-10-CM | POA: Diagnosis not present

## 2018-08-20 DIAGNOSIS — I130 Hypertensive heart and chronic kidney disease with heart failure and stage 1 through stage 4 chronic kidney disease, or unspecified chronic kidney disease: Secondary | ICD-10-CM | POA: Diagnosis not present

## 2018-08-20 DIAGNOSIS — E1151 Type 2 diabetes mellitus with diabetic peripheral angiopathy without gangrene: Secondary | ICD-10-CM | POA: Diagnosis not present

## 2018-08-20 DIAGNOSIS — K219 Gastro-esophageal reflux disease without esophagitis: Secondary | ICD-10-CM | POA: Diagnosis not present

## 2018-08-20 DIAGNOSIS — Z794 Long term (current) use of insulin: Secondary | ICD-10-CM | POA: Diagnosis not present

## 2018-08-20 NOTE — Patient Outreach (Signed)
[place] [facility]) Care Management [place], Transition of Care day 32 Post- SNF discharge day # 35  08/20/2018  Karen [name] Oct 01, 1923 [id number]  [place], 84 y/o female referred to [place] by ED RN CM after recent ED visit 06/26/18 for generalized weakness, dehydration, and fall. Patient was discharged from ED to SNF for rehabilitation. Patient has history including, but not limited to, HTN/ HLD; CKD- stage III; OSA; GERD; recent falls; DM- type II with neuropathy; pA-Fib; previous TIA/ CVA; and anxiety.HIPAA/ identity verified with patient during phone call today; patient reports that home health OT is present at her home at time of our call, and that she is being helped get a bath.  Patient denies new/ recent falls post- last [place] outreach last week; patient also reports that she has started taking HS insulin as previously directed by PCP, and is able to accurately verbalize taking "35 units at night now- no longer taking 42 units."  Reports that her blood sugars "seem to be doing better," and she reports only "one time did it fall below 70 since I started taking this way."  Patient denies having had symptoms with this isolated low blood sugar reading.  Discussed with patient that I had planned to call her son, as we had agreed last week; she denies needing call back today, and I confirmed that patient has my direct phone number, the main [facility] CM office phone number, and the [facility] CM 24-hour nurse advice phone number should issues arise prior to next scheduled [place] outreach.  Encouraged patient to contact me directly if needs, questions, issues, or concerns arise prior to next scheduled outreach within 2 weeks, by phone; patient agreed to do so.   I then contacted patient's son/ caregiver [name], whom patient provided previous verbal consent/ request to speak with; HIPAA/  identity verified with patient's son.  Today caregiver reports: -- he was able to successfully communicate with home health team [place] outreach; reports that home health services have been extended by patient's PCP for 4 additional weeks -- he communicated with PCP office regarding status of endocrinology appointment: confirms that patient's daughter (his sister) will providing transportation to scheduled endocrinology appointment tomorrow, as he will be out of town -- he and patient's daughter have begun regularly monitoring of patient's recorded blood sugars at home; reports "looking better with less low readings post-recent changes made to patient's HS insulin by PCP last week prior to endocrinology office visit; reports family is trying to assist patient in better understanding the importance of following diabetic diet; reports that his wife is a Equities trader and is helping manage patient's understanding of good dietary choices in setting of DM -- family continues regularly visiting patient to monitor her mobility/ fall risks; states that he believes that patient's fall risk should improve with recent conversations family has had with patient; confirms that patient continues wearing life alert system at all times -- maintains that patient continues to adamantly verbalize desire to remain at home, although he acknowledges that patient may need additional assistance in home for ADL's; states family is considering options  Caregiver denies further issues, concerns, or problems today. Iprovided/ confirmed that he hasmy direct phone number, the main [facility] CM office phone number, and the [facility] CM 24-hour nurse advice phone number should issues arise prior to next scheduled [place] outreach by telephone in 2 weeks. Encouraged caregiver to contact  me directly if needs, questions, issues, or concerns arise prior to next scheduled outreach; he agreed to do  so.  Plan:  Patient will take medications as prescribed and will attend all scheduled provider appointments  Patient will promptly notify care providers for any new concerns/ issues/ problems that arise  Patient will actively participate in home health services as ordered post-hospital discharge  Patient will continueusing assistive devices for ambulation  [facility] CM outreachto continue with scheduled phone call to patient and to caregiver in 2 weeks  [facility] CM Care Plan Problem One     Most Recent Value  Care Plan Problem One  High risk for hospital readmission, as evidenced by patient reporting and recent ED visit and subsequent SNF rehabilitation visit with multiple falls reported by patient in 2019  Role Documenting the Problem One  Care Management [place] for Problem One  Not Active  [facility]N Long Term Goal   Over the next 31 days, patient will not experience hospital readmission, as evidenced by patient reporting and review of EMR during [place] outreach  [facility] Long Term Goal Start Date  07/20/18  [facility] Long Term Goal Met Date  08/20/18 [facility] Met]  Interventions for Problem One Long Term Goal  Confirmed that patient has not experienced hospital readmission since SNF discharge  [facility] CM Short Term Goal #1   Over the next 30 days, patient will actively participate in home health services as ordered post- SNF, as evidenced by patient reporting and collaboration with home health team as indicated during [place] outreach  [facility] CM Short Term Goal #1 Start Date  07/20/18  [facility] CM Short Term Goal #1 Met Date  08/20/18 [Goal Met]  Interventions for Short Term Goal #1  Confirmed that patient continues working with home health team and reviewed recent visits with patient,  encouraged patient's ongoing active participation with home health services,  discussed with caregiver process to have home health services extended  [facility] CM Short Term Goal #2   Over the next 30  days, patient will attend all scheduled provider appointments as evidenced by patient reporting/ provider collaboration as indicated, and review of EMR during [facility] RN CCM outreach  [facility] CM Short Term Goal #2 Start Date  07/20/18  [facility] CM Short Term Goal #2 Met Date  08/20/18 [Goal Met]  Interventions for Short Term Goal #2  Confirmed that patient has attended all recent provider appointments,  reviewed upcoming provider appointments and confirmed that patient has reliable transportation to appointments    [facility] CM Care Plan Problem Two     Most Recent Value  Care Plan Problem Two  High fall risk as evidenced by multiple recent falls reported by patient with subsequent ED visits/ SNF admission for rehabilitation  Role Documenting the Problem Two  Care Management Coordinator  Care Plan for Problem Two  Active  Interventions for Problem Two Long Term Goal   Confirmed that patient has continued using assistive devices for ambulation and that she has not experienced new falls since last [place] outreach,  placed care coordination call to son/ caregiver and discussed overall plan for fall prevention at patient's home  [facility] Long Term Goal  Over the next 45 days patient will use assistive devices for ambulation, as evidenced by patient reporting during [facility] RN CCM outreach  [facility] Long Term Goal Start Date  07/20/18    [facility] CM Care Plan Problem Three     Most  Recent Value  Care Plan Problem Three  Self-health management of chronic disease state of DM, as evidenced by patient reporting  Role Documenting the Problem Three  Care Management Coordinator  Care Plan for Problem Three  Active  [facility] Long Term Goal   Over the next 45 days, patient will verbalize 3 signs/ symptoms of hypoglycemia along with action plan for same, as evidenced by patient reporting during [place] outreach  [place] Term Goal Start Date  08/08/18  Interventions for Problem Three Long Term Goal  Discussed with patient and caregiver  signs/ symptoms low blood sugar,  confirmed that patient is able to verbalize accurately new dose of HS insulin,  confirmed that patient believes her blood sugars have improved,  encouraged patient to continue monitoring blood sugars and caregiver to continue reviewing on a regular basis  [facility] CM Short Term Goal #1   Over the next 30 days, patient will schedule and attend recently referred endocrinology provider appointment, as evidenced by patient reporting, review of EMR, and collaboration with care providers as indicated during [facility] RN CCM outreach   [facility] CM Short Term Goal #1 Start Date  08/08/18  Interventions for Short Term Goal #1  Confirmed that patient has scheduled endocrinology provider appointment for tomorrow and that she has reliable transportation and plans to attend appointment     Oneta Rack, RN, BSN, [place] Coordinator [facility] Care Management  [phone]

## 2018-08-20 NOTE — Telephone Encounter (Signed)
Tiffany/PT w/ [facility] called requesting verbal orders for home health PT for 2 times a week for 4 weeks. Best cb [phone]

## 2018-08-20 NOTE — Telephone Encounter (Signed)
Tiffany given verbal orders for home health PT for 2 times a week x 4 weeks.

## 2018-08-21 ENCOUNTER — Telehealth: Payer: Self-pay | Admitting: *Deleted

## 2018-08-21 ENCOUNTER — Encounter: Payer: Self-pay | Admitting: Internal Medicine

## 2018-08-21 ENCOUNTER — Ambulatory Visit: Payer: Medicare Other | Admitting: Internal Medicine

## 2018-08-21 VITALS — BP 136/60 | HR 62 | Ht 63.0 in | Wt 137.0 lb

## 2018-08-21 DIAGNOSIS — E1165 Type 2 diabetes mellitus with hyperglycemia: Secondary | ICD-10-CM | POA: Diagnosis not present

## 2018-08-21 DIAGNOSIS — E1142 Type 2 diabetes mellitus with diabetic polyneuropathy: Secondary | ICD-10-CM

## 2018-08-21 MED ORDER — GLUCAGON (RDNA) 1 MG IJ KIT: 1.0000 mg | PACK | Freq: Once | INTRAMUSCULAR | 12 refills | Status: AC | PRN

## 2018-08-21 NOTE — Telephone Encounter (Signed)
Marlowe Kays, OT with [facility], left vm at triage requesting verbal orders for PT for once this week and 2x/wk for 3wks. pls advise

## 2018-08-21 NOTE — Patient Instructions (Addendum)
Please continue: - Lantus 35 units at bedtime   Please change: - Humalog 05-17-15 units before each meal  Please return in 3 months with your sugar log.   PATIENT INSTRUCTIONS FOR TYPE 2 DIABETES:  **Please join MyChart!** - see attached instructions about how to join if you have not done so already.  DIET AND EXERCISE Diet and exercise is an important part of diabetic treatment.  We recommended aerobic exercise in the form of brisk walking (working between 40-60% of maximal aerobic capacity, similar to brisk walking) for 150 minutes per week (such as 30 minutes five days per week) along with 3 times per week performing 'resistance' training (using various gauge rubber tubes with handles) 5-10 exercises involving the major muscle groups (upper body, lower body and core) performing 10-15 repetitions (or near fatigue) each exercise. Start at half the above goal but build slowly to reach the above goals. If limited by weight, joint pain, or disability, we recommend daily walking in a swimming pool with water up to waist to reduce pressure from joints while allow for adequate exercise.    BLOOD GLUCOSES Monitoring your blood glucoses is important for continued management of your diabetes. Please check your blood glucoses 2-4 times a day: fasting, before meals and at bedtime (you can rotate these measurements - e.g. one day check before the 3 meals, the next day check before 2 of the meals and before bedtime, etc.).   HYPOGLYCEMIA (low blood sugar) Hypoglycemia is usually a reaction to not eating, exercising, or taking too much insulin/ other diabetes drugs.  Symptoms include tremors, sweating, hunger, confusion, headache, etc. Treat IMMEDIATELY with 15 grams of Carbs: . 4 glucose tablets .  cup regular juice/soda . 2 tablespoons raisins . 4 teaspoons sugar . 1 tablespoon honey Recheck blood glucose in 15 mins and repeat above if still symptomatic/blood glucose <100.  RECOMMENDATIONS TO  REDUCE YOUR RISK OF DIABETIC COMPLICATIONS: * Take your prescribed MEDICATION(S) * Follow a DIABETIC diet: Complex carbs, fiber rich foods, (monounsaturated and polyunsaturated) fats * AVOID saturated/trans fats, high fat foods, >2,300 mg salt per day. * EXERCISE at least 5 times a week for 30 minutes or preferably daily.  * DO NOT SMOKE OR DRINK more than 1 drink a day. * Check your FEET every day. Do not wear tightfitting shoes. Contact us if you develop an ulcer * See your EYE doctor once a year or more if needed * Get a FLU shot once a year * Get a PNEUMONIA vaccine once before and once after age 38 years  GOALS:  * Your Hemoglobin A1c of <7%  * fasting sugars need to be <130 * after meals sugars need to be <180 (2h after you start eating) * Your Systolic BP should be 754 or lower  * Your Diastolic BP should be 80 or lower  * Your HDL (Good Cholesterol) should be 40 or higher  * Your LDL (Bad Cholesterol) should be 100 or lower. * Your Triglycerides should be 150 or lower  * Your Urine microalbumin (kidney function) should be <30 * Your Body Mass Index should be 25 or lower    Please consider the following ways to cut down carbs and fat and increase fiber and micronutrients in your diet: - substitute whole grain for white bread or pasta - substitute brown rice for white rice - substitute 90-calorie flat bread pieces for slices of bread when possible - substitute sweet potatoes or yams for white potatoes - substitute humus  for margarine - substitute tofu for cheese when possible - substitute almond or rice milk for regular milk (would not drink soy milk daily due to concern for soy estrogen influence on breast cancer risk) - substitute dark chocolate for other sweets when possible - substitute water - can add lemon or orange slices for taste - for diet sodas (artificial sweeteners will trick your body that you can eat sweets without getting calories and will lead you to overeating  and weight gain in the long run) - do not skip breakfast or other meals (this will slow down the metabolism and will result in more weight gain over time)  - can try smoothies made from fruit and almond/rice milk in am instead of regular breakfast - can also try old-fashioned (not instant) oatmeal made with almond/rice milk in am - order the dressing on the side when eating salad at a restaurant (pour less than half of the dressing on the salad) - eat as little meat as possible - can try juicing, but should not forget that juicing will get rid of the fiber, so would alternate with eating raw veg./fruits or drinking smoothies - use as little oil as possible, even when using olive oil - can dress a salad with a mix of balsamic vinegar and lemon juice, for e.g. - use agave nectar, stevia sugar, or regular sugar rather than artificial sweateners - steam or broil/roast veggies  - snack on veggies/fruit/nuts (unsalted, preferably) when possible, rather than processed foods - reduce or eliminate aspartame in diet (it is in diet sodas, chewing gum, etc) Read the labels!  Try to read Dr. Janene Harvey book: "Program for Reversing Diabetes" for other ideas for healthy eating.

## 2018-08-21 NOTE — Telephone Encounter (Signed)
Verbal orders given to [facility] with [facility] for PT once this week, then 2 x week for 3 weeks per Dr. Diona Browner.

## 2018-08-21 NOTE — Progress Notes (Signed)
Patient ID: Karen [name], female   DOB: 1924-01-28, 85 y.o.   MRN: [id number]   HPI: Karen [name] is a 85 y.o.-year-old female, referred by her PCP, Dr. Diona [name], for management of DM2, dx in 1984, insulin-dependent since ~2000, uncontrolled, with complications (CKD, PN, macular degeneration). She is here with her daughter who offers part of the history especially related to her meals and past medical history.  The daughter lives an hour away from the patient.  Patient also has a [name] that lives close to her.  Reviewed latest HbA1c level: Lab Results  Component Value Date   HGBA1C 6.6 (H) 08/03/2018   HGBA1C 8.0 (H) 03/22/2018   HGBA1C 7.1 (H) 11/21/2017   HGBA1C 6.7 (H) 07/11/2017   HGBA1C 7.7 (H) 03/22/2017   HGBA1C 6.5 (H) 12/19/2016   HGBA1C 6.6 (H) 09/15/2016   HGBA1C 5.8 (H) 07/18/2016   HGBA1C 6.2 (H) 05/11/2016   HGBA1C 8.0 (H) 01/18/2016   Pt is on a regimen of: - Lantus 42 >> 35 units at bedtime (changed last week b/c hypoglycemia at night) - Humalog 05-15-15 (19 if has dessert) units before each meal  Pt checks her sugars 3x a day and they are: - am: 60-150 (after Lantus dose decrease: 99-140) - 2h after b'fast: n/c - before lunch: 100-150 - 2h after lunch: n/c - before dinner: 140-200 - 2h after dinner: 190-220 - bedtime: n/c - nighttime: n/c Lowest sugar was 37 - mid-day - took Humalog but did not eat enough for b'fast; she has hypoglycemia awareness at 60.  Highest sugar was 600 - steroid inj. she does not have a glucagon kit at home.  She lives by herself.  Glucometer: One Touch ultra  Pt's meals are: - Breakfast: cereal (Rice crispies) + 2% mild + toast + apple sauce + diet pepsi - Lunch: cheese or ham sandwich with tomatoes + diet pepsi  - Dinner: meat + 2 veggies + sometimes dessert + diet pepsi - Snacks: rice crispies + icecream bedtime - every other night; cheese puffs + drinks Propel with meals and inbetween meals  -+ CKD, last  BUN/creatinine:  Lab Results  Component Value Date   BUN 45 (H) 08/03/2018   BUN 36 (H) 06/26/2018   CREATININE 2.12 (H) 08/03/2018   CREATININE 1.82 (H) 06/26/2018   -+ HL; last set of lipids: Lab Results  Component Value Date   CHOL 169 03/22/2018   HDL 30.70 (L) 03/22/2018   LDLCALC 88 11/21/2017   LDLDIRECT 122.0 03/22/2018   TRIG 234.0 (H) 03/22/2018   CHOLHDL 6 03/22/2018   - last eye exam was in 11/2017. No DR. + macular degeneration - [facility]. Dr. Bing [name].  - + numbness and tingling, and pain in her feet - needs to put ice on her feet at night.  On Neurontin 200 mg daily (to help with tremors, however).  On ASA 325.  She also has a history of postsurgical hypothyroidism and is on levothyroxine 125 mcg daily.  [name] has a history of thyroid cancer.  ROS: Constitutional: + weight gain, + fatigue, + hot flushes, + poor sleep, + nocturia Eyes: + blurry vision, no xerophthalmia ENT: + sore throat, no nodules palpated in neck, no dysphagia/odynophagia, + hoarseness, + hypoacusis Cardiovascular: no CP/+ SOB/+ palpitations/+ leg swelling Respiratory: no cough/+ SOB Gastrointestinal: + N/+ V/+ D/+ C, + heartburn Musculoskeletal: + muscle aches/+ joint aches Skin: + rash, + itching, + easy bruising, + hair loss Neurological: + tremors/numbness/tingling/dizziness, + HA Psychiatric: +  anxiety  Past Medical History:  Diagnosis Date  . A-fib ([place])   . Abnormal involuntary movements(781.0)   . Anemia   . Anxiety   . Anxiety state, unspecified   . Arthritis    body, neck  . Benign hypertensive heart disease   . Benign hypertensive heart disease with congestive heart failure ([place])   . Bladder disorder    over active  . Cancer ([place])    skin, removed  . Cervicalgia   . CHF (congestive heart failure) ([place])   . Chronic kidney disease, stage III (moderate) (H[facility])   . Congestive heart failure, unspecified   . Contact dermatitis and other eczema, due to unspecified cause   . Corns  and callosities   . Diabetes mellitus without complication ([place])   . Diarrhea   . Dyspnea 02/18/2015  . Edema   . Esophageal stricture   . Gastroparesis   . Gastroparesis   . GERD (gastroesophageal reflux disease)   . Hiatal hernia   . Hyperlipidemia   . Inflamed seborrheic keratosis   . Inflammatory disease of breast   . Irregular heartbeat   . Lumbago   . Mixed incontinence urge and stress (female)(female)   . Nausea alone   . Nonspecific (abnormal) findings on radiological and other examination of skull and head   . Osteoarthrosis, unspecified whether generalized or localized, unspecified site   . Other and unspecified hyperlipidemia   . Other B-complex deficiencies   . Other functional disorder of bladder   . Other malaise and fatigue   . Other specified visual disturbances   . Pain in joint, lower leg   . Palpitations   . Peripheral vascular disease, unspecified ([place])   . Postmenopausal atrophic vaginitis   . Reflux esophagitis   . Seborrheic keratosis   . Sleep apnea   . Spasm of muscle   . Spinal stenosis   . Spinal stenosis, unspecified region other than cervical   . Stricture and stenosis of esophagus   . Stroke ([place])   . TIA (transient ischemic attack) 2012  . Transient ischemic attack (TIA), and cerebral infarction without residual deficits(V12.54)   . Type II or unspecified type diabetes mellitus with renal manifestations, not stated as uncontrolled(250.40)   . Unspecified constipation   . Unspecified essential hypertension   . Unspecified hereditary and idiopathic peripheral neuropathy   . Unspecified hypothyroidism   . Unspecified pruritic disorder   . Unspecified sleep apnea   . Unspecified vitamin D deficiency   . Urinary tract infection, site not specified   . Vitamin B12 deficiency    Past Surgical History:  Procedure Laterality Date  . ABDOMINAL HYSTERECTOMY    . APPENDECTOMY    . BACK SURGERY     x 2  . CESAREAN SECTION    . CHOLECYSTECTOMY   1991  . esophageal  2004,2006   dilation, Dr Karen [name]  . [place]   bilateral cataract  . hammer toes    . HERNIA REPAIR  1984aaaaaaaa  . SKIN CANCER DESTRUCTION    . THYROIDECTOMY, PARTIAL  1965   Social History   Socioeconomic History  . Marital status: Widowed    Spouse name: Not on file  . Number of children: 3  . Years of education: Not on file  . Highest education level: Not on file  Occupational History  . Occupation: retired  Scientific laboratory technician  . Financial resource strain: Not hard at all  . Food insecurity:  Worry: Never true    Inability: Never true  . Transportation needs:    Medical: No    Non-medical: No  Tobacco Use  . Smoking status: Never Smoker  . Smokeless tobacco: Never Used  Substance and Sexual Activity  . Alcohol use: No  . Drug use: No  . Sexual activity: Never  Lifestyle  . Physical activity:    Days per week: 0 days    Minutes per session: 0 min  . Stress: Rather much  Relationships  . Social connections:    Talks on phone: More than three times a week    Gets together: More than three times a week    Attends religious service: More than 4 times per year    Active member of club or organization: Yes    Attends meetings of clubs or organizations: Never    Relationship status: Widowed  . Intimate partner violence:    Fear of current or ex partner: No    Emotionally abused: No    Physically abused: No    Forced sexual activity: No  Other Topics Concern  . Not on file  Social History Narrative  . Not on file   Current Outpatient Medications on File Prior to Visit  Medication Sig Dispense Refill  . ALPRAZolam (XANAX) 1 MG tablet 0.5 mg tabs three times daily and 1  tab at bedtime 120 tablet 0  . aspirin 325 MG EC tablet Take 325 mg by mouth daily.    Marland Kitchen CRANBERRY PO Take 300 mg by mouth daily.    . [employer] NATURAL LUTEIN EYE HEALTH PO Take 1 tablet by mouth daily.     . diclofenac sodium (VOLTAREN) 1 % GEL Apply 4 g  topically 4 (four) times daily. To bilateral knees 100 g 5  . digoxin (LANOXIN) 0.125 MG tablet TAKE ONE TABLET BY MOUTH ON MONDAY, WEDNESDAY, FRIDAY, AND SATURDAY. (Patient taking differently: TAKE ONE TABLET BY MOUTH ON MONDAY, WEDNESDAY, FRIDAY, AND SATURDAY.) 16 tablet 13  . furosemide (LASIX) 40 MG tablet TAKE 1 TABLET BY MOUTH DAILY TO CONTROL EDEMA 90 tablet 0  . gabapentin (NEURONTIN) 100 MG capsule TAKE TWO CAPSULES BY MOUTH AT BEDTIME TO HELP WITH TREMORS 180 capsule 1  . HYDROcodone-acetaminophen (NORCO) 7.5-325 MG tablet Take 1 tablet by mouth 4 (four) times daily. 120 tablet 0  . HYDROcodone-acetaminophen (NORCO) 7.5-325 MG tablet Take 1 tablet by mouth 4 (four) times daily. DO NOT FILL UNTIL 09/19/2018 120 tablet 0  . HYDROcodone-acetaminophen (NORCO) 7.5-325 MG tablet Take 1 tablet by mouth 4 (four) times daily. 120 tablet 0  . insulin lispro (HUMALOG KWIKPEN) 100 UNIT/ML KwikPen Inj 10u before breakfast, 14u before lunch, 14u before supper. May use up to 3 more units/meal prn per sliding scale max units 45. Dx:E11.29, Normal 15 mL 0  . Insulin Pen Needle (B-D ULTRAFINE III SHORT PEN) 31G X 8 MM MISC Use with administering insulin. Dx. E11.29 100 each 11  . LANTUS SOLOSTAR 100 UNIT/ML Solostar Pen INJECT 42 UNITS INTO THE SKIN AT BEDTIME (Patient taking differently: Inject 42 Units into the skin at bedtime. ) 14 pen 1  . levothyroxine (SYNTHROID, LEVOTHROID) 125 MCG tablet TAKE ONE TABLET BY MOUTH EVERY MORNING 30 MINUTES BEFORE BREAKFAST 30 tablet 5  . metoprolol tartrate (LOPRESSOR) 50 MG tablet TAKE 1 TABLET BY MOUTH TWICE A DAY TO CONTROL HEART RATE (Patient taking differently: Take 50 mg by mouth 2 (two) times daily. ) 180 tablet 1  . Multiple Vitamins-Minerals (  PRESERVISION AREDS 2 PO) Take 1 tablet by mouth 2 (two) times daily.     . nitroGLYCERIN (NITROSTAT) 0.4 MG SL tablet Place 1 tablet (0.4 mg total) under the tongue every 5 (five) minutes as needed for chest pain. 25 tablet  12  . omeprazole (PRILOSEC) 40 MG capsule TAKE ONE (1) CAPSULE BY MOUTH 2 TIMES DAILY FOR STOMACH (Patient taking differently: Take 40 mg by mouth 2 (two) times daily. ) 180 capsule 1  . ondansetron (ZOFRAN) 4 MG tablet TAKE ONE TABLET BY MOUTH EVERY 4 HOURS AS NEEDED FOR NAUSEA/VOMITING (Patient taking differently: Take 4 mg by mouth every 8 (eight) hours as needed. ) 30 tablet 0  . ONE TOUCH ULTRA TEST test strip Use to test sugar three times daily dx E11.29 100 each 6  . Polyethyl Glycol-Propyl Glycol (SYSTANE OP) Place 1 drop into both eyes 2 (two) times daily.     Marland Kitchen triamcinolone cream (KENALOG) 0.1 % Apply 1 application topically as needed. Mix with CeraVe cream apply twice daily for dry skin    . trimethoprim (TRIMPEX) 100 MG tablet TAKE ONE TABLET BY MOUTH EVERY NIGHT AT BEDTIME TO PREVENT BLADDER INFECTION. (Patient taking differently: Take 100 mg by mouth at bedtime. ) 30 tablet 11  . Vitamin D, Ergocalciferol, (DRISDOL) 50000 units CAPS capsule TAKE ONE CAPSULE BY MOUTH ON THE SAME DAY EVERY WEEK AS DIRECTED (Patient taking differently: Take 50,000 Units by mouth every 7 (seven) days. ) 12 capsule 1  . VITAMIN E PO Take 400 Units by mouth daily.     No current facility-administered medications on file prior to visit.    Allergies  Allergen Reactions  . Celebrex [Celecoxib] Rash  . Cymbalta [Duloxetine Hcl] Rash    Lip numbness,rash, leg and body jerks. Patient states " I though I was having a heart attack or stroke."   . Doxycycline Rash  . Lyrica [Pregabalin] Rash  . Avandia [Rosiglitazone]   . Macrodantin [Nitrofurantoin Macrocrystal]   . Sulfa Antibiotics   . Vioxx [Rofecoxib]   . Silicone Rash    Gets rash from the touch it.   Family History  Problem Relation Age of Onset  . Cancer Mother        ? stomach  . Heart disease Father   . Hyperlipidemia Father   . Hypertension Father   . Heart attack Father   . Heart disease Brother   . Kidney disease Daughter   . Heart  disease Brother   . Cancer [name]   Daughter has a history of skin cancer while [name] has a history of thyroid cancer.  PE: BP 136/60   Pulse 62   Ht '5\' 3"'$  (1.6 m) Comment: measured  Wt 137 lb (62.1 kg)   SpO2 92%   BMI 24.27 kg/m  Wt Readings from Last 3 Encounters:  08/21/18 137 lb (62.1 kg)  08/03/18 141 lb 8 oz (64.2 kg)  06/26/18 135 lb (61.2 kg)   Constitutional: normal weight, in NAD Eyes: PERRLA, EOMI, no exophthalmos ENT: moist mucous membranes, no thyromegaly, no cervical lymphadenopathy Cardiovascular: RRR, No MRG Respiratory: CTA B Gastrointestinal: abdomen soft, NT, ND, BS+ Musculoskeletal: no deformities, strength intact in all 4 Skin: moist, warm, no rashes Neurological: ++ tremor with outstretched hands, DTR normal in all 4  ASSESSMENT: 1. DM2, insulin-dependent, uncontrolled, with long-term complications - CKD - PN - macular degeneration  PLAN:  1. Patient with long-standing, uncontrolled diabetes, on oral antidiabetic regimen, with history of low blood sugar  episodes in a.m. when she was on a higher dose of Lantus.  This was decreased last week and she did not have low blood sugar since.  In fact, her a.m. sugars are now at goal.  Before lunch, she is at or close to goal blood sugars increase significantly before dinner and they remain high even after dinner. -She is having a high carb breakfast but she appears to be covering it well with Humalog.  However, his drinking diet sodas with every meal and we discussed about ideally stopping these.  Also, she has rice crispies with his ice cream after dinner which is an impediment in getting her diabetes under better control.  We discussed about possibly stopping these and replacing them with healthier snacks. -For now, I advised her to continue Lantus at the same dose and we increased her Humalog with lunch slightly. - I suggested to:  Patient Instructions  Please continue: - Lantus 35 units at bedtime   Please  change: - Humalog 05-17-15 units before each meal  Please return in 3 months with your sugar log.   -Continue checking sugars at different times of the day - check 3x a day, rotating checks - discussed about CBG targets for treatment: 90-150 mg/dL before meals and <200 mg/dL after meals; target HbA1c <7.5% -due to age. - given sugar log and advised how to fill it and to bring it at next appt  - given foot care handout and explained the principles  - given instructions for hypoglycemia management "15-15 rule"  - advised for yearly eye exams  - Return to clinic in 3 mo with sugar log   Philemon Kingdom, MD PhD [facility] Endocrinology

## 2018-08-21 NOTE — Telephone Encounter (Signed)
Okay to give verbal order as requested.

## 2018-08-22 DIAGNOSIS — Z9181 History of falling: Secondary | ICD-10-CM | POA: Diagnosis not present

## 2018-08-22 DIAGNOSIS — E1142 Type 2 diabetes mellitus with diabetic polyneuropathy: Secondary | ICD-10-CM | POA: Diagnosis not present

## 2018-08-22 DIAGNOSIS — Z7982 Long term (current) use of aspirin: Secondary | ICD-10-CM | POA: Diagnosis not present

## 2018-08-22 DIAGNOSIS — G4730 Sleep apnea, unspecified: Secondary | ICD-10-CM | POA: Diagnosis not present

## 2018-08-22 DIAGNOSIS — M4800 Spinal stenosis, site unspecified: Secondary | ICD-10-CM | POA: Diagnosis not present

## 2018-08-22 DIAGNOSIS — I2510 Atherosclerotic heart disease of native coronary artery without angina pectoris: Secondary | ICD-10-CM | POA: Diagnosis not present

## 2018-08-22 DIAGNOSIS — E039 Hypothyroidism, unspecified: Secondary | ICD-10-CM | POA: Diagnosis not present

## 2018-08-22 DIAGNOSIS — I130 Hypertensive heart and chronic kidney disease with heart failure and stage 1 through stage 4 chronic kidney disease, or unspecified chronic kidney disease: Secondary | ICD-10-CM | POA: Diagnosis not present

## 2018-08-22 DIAGNOSIS — G894 Chronic pain syndrome: Secondary | ICD-10-CM | POA: Diagnosis not present

## 2018-08-22 DIAGNOSIS — N184 Chronic kidney disease, stage 4 (severe): Secondary | ICD-10-CM | POA: Diagnosis not present

## 2018-08-22 DIAGNOSIS — M170 Bilateral primary osteoarthritis of knee: Secondary | ICD-10-CM | POA: Diagnosis not present

## 2018-08-22 DIAGNOSIS — I509 Heart failure, unspecified: Secondary | ICD-10-CM | POA: Diagnosis not present

## 2018-08-22 DIAGNOSIS — K219 Gastro-esophageal reflux disease without esophagitis: Secondary | ICD-10-CM | POA: Diagnosis not present

## 2018-08-22 DIAGNOSIS — Z794 Long term (current) use of insulin: Secondary | ICD-10-CM | POA: Diagnosis not present

## 2018-08-22 DIAGNOSIS — Z8673 Personal history of transient ischemic attack (TIA), and cerebral infarction without residual deficits: Secondary | ICD-10-CM | POA: Diagnosis not present

## 2018-08-22 DIAGNOSIS — I480 Paroxysmal atrial fibrillation: Secondary | ICD-10-CM | POA: Diagnosis not present

## 2018-08-22 DIAGNOSIS — E1151 Type 2 diabetes mellitus with diabetic peripheral angiopathy without gangrene: Secondary | ICD-10-CM | POA: Diagnosis not present

## 2018-08-22 DIAGNOSIS — E1122 Type 2 diabetes mellitus with diabetic chronic kidney disease: Secondary | ICD-10-CM | POA: Diagnosis not present

## 2018-08-23 ENCOUNTER — Other Ambulatory Visit: Payer: Self-pay | Admitting: Family Medicine

## 2018-08-23 DIAGNOSIS — F411 Generalized anxiety disorder: Secondary | ICD-10-CM

## 2018-08-23 DIAGNOSIS — N184 Chronic kidney disease, stage 4 (severe): Secondary | ICD-10-CM

## 2018-08-23 DIAGNOSIS — Z794 Long term (current) use of insulin: Secondary | ICD-10-CM

## 2018-08-23 DIAGNOSIS — E1142 Type 2 diabetes mellitus with diabetic polyneuropathy: Secondary | ICD-10-CM

## 2018-08-23 DIAGNOSIS — E1151 Type 2 diabetes mellitus with diabetic peripheral angiopathy without gangrene: Secondary | ICD-10-CM

## 2018-08-23 DIAGNOSIS — G894 Chronic pain syndrome: Secondary | ICD-10-CM | POA: Diagnosis not present

## 2018-08-23 DIAGNOSIS — E039 Hypothyroidism, unspecified: Secondary | ICD-10-CM

## 2018-08-23 DIAGNOSIS — I2510 Atherosclerotic heart disease of native coronary artery without angina pectoris: Secondary | ICD-10-CM

## 2018-08-23 DIAGNOSIS — M4800 Spinal stenosis, site unspecified: Secondary | ICD-10-CM

## 2018-08-23 DIAGNOSIS — G4730 Sleep apnea, unspecified: Secondary | ICD-10-CM

## 2018-08-23 DIAGNOSIS — I130 Hypertensive heart and chronic kidney disease with heart failure and stage 1 through stage 4 chronic kidney disease, or unspecified chronic kidney disease: Secondary | ICD-10-CM | POA: Diagnosis not present

## 2018-08-23 DIAGNOSIS — Z8673 Personal history of transient ischemic attack (TIA), and cerebral infarction without residual deficits: Secondary | ICD-10-CM

## 2018-08-23 DIAGNOSIS — M170 Bilateral primary osteoarthritis of knee: Secondary | ICD-10-CM | POA: Diagnosis not present

## 2018-08-23 DIAGNOSIS — E1122 Type 2 diabetes mellitus with diabetic chronic kidney disease: Secondary | ICD-10-CM

## 2018-08-23 DIAGNOSIS — I480 Paroxysmal atrial fibrillation: Secondary | ICD-10-CM

## 2018-08-23 DIAGNOSIS — K219 Gastro-esophageal reflux disease without esophagitis: Secondary | ICD-10-CM

## 2018-08-23 DIAGNOSIS — I509 Heart failure, unspecified: Secondary | ICD-10-CM | POA: Diagnosis not present

## 2018-08-23 DIAGNOSIS — Z9181 History of falling: Secondary | ICD-10-CM

## 2018-08-23 DIAGNOSIS — Z7982 Long term (current) use of aspirin: Secondary | ICD-10-CM

## 2018-08-24 DIAGNOSIS — E1142 Type 2 diabetes mellitus with diabetic polyneuropathy: Secondary | ICD-10-CM | POA: Diagnosis not present

## 2018-08-24 DIAGNOSIS — E1151 Type 2 diabetes mellitus with diabetic peripheral angiopathy without gangrene: Secondary | ICD-10-CM | POA: Diagnosis not present

## 2018-08-24 DIAGNOSIS — M4800 Spinal stenosis, site unspecified: Secondary | ICD-10-CM | POA: Diagnosis not present

## 2018-08-24 DIAGNOSIS — I509 Heart failure, unspecified: Secondary | ICD-10-CM | POA: Diagnosis not present

## 2018-08-24 DIAGNOSIS — G4730 Sleep apnea, unspecified: Secondary | ICD-10-CM | POA: Diagnosis not present

## 2018-08-24 DIAGNOSIS — Z8673 Personal history of transient ischemic attack (TIA), and cerebral infarction without residual deficits: Secondary | ICD-10-CM | POA: Diagnosis not present

## 2018-08-24 DIAGNOSIS — Z794 Long term (current) use of insulin: Secondary | ICD-10-CM | POA: Diagnosis not present

## 2018-08-24 DIAGNOSIS — I130 Hypertensive heart and chronic kidney disease with heart failure and stage 1 through stage 4 chronic kidney disease, or unspecified chronic kidney disease: Secondary | ICD-10-CM | POA: Diagnosis not present

## 2018-08-24 DIAGNOSIS — I2510 Atherosclerotic heart disease of native coronary artery without angina pectoris: Secondary | ICD-10-CM | POA: Diagnosis not present

## 2018-08-24 DIAGNOSIS — Z9181 History of falling: Secondary | ICD-10-CM | POA: Diagnosis not present

## 2018-08-24 DIAGNOSIS — Z7982 Long term (current) use of aspirin: Secondary | ICD-10-CM | POA: Diagnosis not present

## 2018-08-24 DIAGNOSIS — I480 Paroxysmal atrial fibrillation: Secondary | ICD-10-CM | POA: Diagnosis not present

## 2018-08-24 DIAGNOSIS — E039 Hypothyroidism, unspecified: Secondary | ICD-10-CM | POA: Diagnosis not present

## 2018-08-24 DIAGNOSIS — K219 Gastro-esophageal reflux disease without esophagitis: Secondary | ICD-10-CM | POA: Diagnosis not present

## 2018-08-24 DIAGNOSIS — N184 Chronic kidney disease, stage 4 (severe): Secondary | ICD-10-CM | POA: Diagnosis not present

## 2018-08-24 DIAGNOSIS — G894 Chronic pain syndrome: Secondary | ICD-10-CM | POA: Diagnosis not present

## 2018-08-24 DIAGNOSIS — M170 Bilateral primary osteoarthritis of knee: Secondary | ICD-10-CM | POA: Diagnosis not present

## 2018-08-24 DIAGNOSIS — E1122 Type 2 diabetes mellitus with diabetic chronic kidney disease: Secondary | ICD-10-CM | POA: Diagnosis not present

## 2018-08-27 DIAGNOSIS — N184 Chronic kidney disease, stage 4 (severe): Secondary | ICD-10-CM | POA: Diagnosis not present

## 2018-08-27 DIAGNOSIS — Z8673 Personal history of transient ischemic attack (TIA), and cerebral infarction without residual deficits: Secondary | ICD-10-CM | POA: Diagnosis not present

## 2018-08-27 DIAGNOSIS — E039 Hypothyroidism, unspecified: Secondary | ICD-10-CM | POA: Diagnosis not present

## 2018-08-27 DIAGNOSIS — I509 Heart failure, unspecified: Secondary | ICD-10-CM | POA: Diagnosis not present

## 2018-08-27 DIAGNOSIS — E1142 Type 2 diabetes mellitus with diabetic polyneuropathy: Secondary | ICD-10-CM | POA: Diagnosis not present

## 2018-08-27 DIAGNOSIS — I480 Paroxysmal atrial fibrillation: Secondary | ICD-10-CM | POA: Diagnosis not present

## 2018-08-27 DIAGNOSIS — M170 Bilateral primary osteoarthritis of knee: Secondary | ICD-10-CM | POA: Diagnosis not present

## 2018-08-27 DIAGNOSIS — Z7982 Long term (current) use of aspirin: Secondary | ICD-10-CM | POA: Diagnosis not present

## 2018-08-27 DIAGNOSIS — Z9181 History of falling: Secondary | ICD-10-CM | POA: Diagnosis not present

## 2018-08-27 DIAGNOSIS — I2510 Atherosclerotic heart disease of native coronary artery without angina pectoris: Secondary | ICD-10-CM | POA: Diagnosis not present

## 2018-08-27 DIAGNOSIS — G4730 Sleep apnea, unspecified: Secondary | ICD-10-CM | POA: Diagnosis not present

## 2018-08-27 DIAGNOSIS — M4800 Spinal stenosis, site unspecified: Secondary | ICD-10-CM | POA: Diagnosis not present

## 2018-08-27 DIAGNOSIS — E1151 Type 2 diabetes mellitus with diabetic peripheral angiopathy without gangrene: Secondary | ICD-10-CM | POA: Diagnosis not present

## 2018-08-27 DIAGNOSIS — I130 Hypertensive heart and chronic kidney disease with heart failure and stage 1 through stage 4 chronic kidney disease, or unspecified chronic kidney disease: Secondary | ICD-10-CM | POA: Diagnosis not present

## 2018-08-27 DIAGNOSIS — Z794 Long term (current) use of insulin: Secondary | ICD-10-CM | POA: Diagnosis not present

## 2018-08-27 DIAGNOSIS — G894 Chronic pain syndrome: Secondary | ICD-10-CM | POA: Diagnosis not present

## 2018-08-27 DIAGNOSIS — K219 Gastro-esophageal reflux disease without esophagitis: Secondary | ICD-10-CM | POA: Diagnosis not present

## 2018-08-27 DIAGNOSIS — E1122 Type 2 diabetes mellitus with diabetic chronic kidney disease: Secondary | ICD-10-CM | POA: Diagnosis not present

## 2018-08-28 ENCOUNTER — Telehealth: Payer: Self-pay

## 2018-08-28 DIAGNOSIS — E1142 Type 2 diabetes mellitus with diabetic polyneuropathy: Secondary | ICD-10-CM | POA: Diagnosis not present

## 2018-08-28 DIAGNOSIS — N184 Chronic kidney disease, stage 4 (severe): Secondary | ICD-10-CM | POA: Diagnosis not present

## 2018-08-28 DIAGNOSIS — Z9181 History of falling: Secondary | ICD-10-CM | POA: Diagnosis not present

## 2018-08-28 DIAGNOSIS — G4730 Sleep apnea, unspecified: Secondary | ICD-10-CM | POA: Diagnosis not present

## 2018-08-28 DIAGNOSIS — I130 Hypertensive heart and chronic kidney disease with heart failure and stage 1 through stage 4 chronic kidney disease, or unspecified chronic kidney disease: Secondary | ICD-10-CM | POA: Diagnosis not present

## 2018-08-28 DIAGNOSIS — Z794 Long term (current) use of insulin: Secondary | ICD-10-CM | POA: Diagnosis not present

## 2018-08-28 DIAGNOSIS — M170 Bilateral primary osteoarthritis of knee: Secondary | ICD-10-CM | POA: Diagnosis not present

## 2018-08-28 DIAGNOSIS — E1122 Type 2 diabetes mellitus with diabetic chronic kidney disease: Secondary | ICD-10-CM | POA: Diagnosis not present

## 2018-08-28 DIAGNOSIS — E039 Hypothyroidism, unspecified: Secondary | ICD-10-CM | POA: Diagnosis not present

## 2018-08-28 DIAGNOSIS — I480 Paroxysmal atrial fibrillation: Secondary | ICD-10-CM | POA: Diagnosis not present

## 2018-08-28 DIAGNOSIS — K219 Gastro-esophageal reflux disease without esophagitis: Secondary | ICD-10-CM | POA: Diagnosis not present

## 2018-08-28 DIAGNOSIS — E1151 Type 2 diabetes mellitus with diabetic peripheral angiopathy without gangrene: Secondary | ICD-10-CM | POA: Diagnosis not present

## 2018-08-28 DIAGNOSIS — Z8673 Personal history of transient ischemic attack (TIA), and cerebral infarction without residual deficits: Secondary | ICD-10-CM | POA: Diagnosis not present

## 2018-08-28 DIAGNOSIS — I2510 Atherosclerotic heart disease of native coronary artery without angina pectoris: Secondary | ICD-10-CM | POA: Diagnosis not present

## 2018-08-28 DIAGNOSIS — Z7982 Long term (current) use of aspirin: Secondary | ICD-10-CM | POA: Diagnosis not present

## 2018-08-28 DIAGNOSIS — G894 Chronic pain syndrome: Secondary | ICD-10-CM | POA: Diagnosis not present

## 2018-08-28 DIAGNOSIS — M4800 Spinal stenosis, site unspecified: Secondary | ICD-10-CM | POA: Diagnosis not present

## 2018-08-28 DIAGNOSIS — I509 Heart failure, unspecified: Secondary | ICD-10-CM | POA: Diagnosis not present

## 2018-08-28 NOTE — Telephone Encounter (Signed)
Agree..  needs urgent opthamology eval.

## 2018-08-28 NOTE — Telephone Encounter (Signed)
Dodd left v/m (DPR signed) said pt used eye gtts had 4 -6 months and lt eye started bleeding. I spoke with [name] and pt did not use eye drops but used the onetouch ultra control solution which looks red and that is why pt thought her eye was bleeding. Blurry vision; and lt eye has pain now. [name] was talking with Dr Bing Plume the opthomologist office and is waiting on cb from that office with advise what to do. Advised if pts vision is blurry and having pain in the lt eye should go to UC for evaluation. Dodd voiced understanding. FYI to Dr Diona Browner.

## 2018-08-29 ENCOUNTER — Telehealth: Payer: Self-pay | Admitting: *Deleted

## 2018-08-29 DIAGNOSIS — E1122 Type 2 diabetes mellitus with diabetic chronic kidney disease: Secondary | ICD-10-CM | POA: Diagnosis not present

## 2018-08-29 DIAGNOSIS — Z7982 Long term (current) use of aspirin: Secondary | ICD-10-CM | POA: Diagnosis not present

## 2018-08-29 DIAGNOSIS — M170 Bilateral primary osteoarthritis of knee: Secondary | ICD-10-CM | POA: Diagnosis not present

## 2018-08-29 DIAGNOSIS — E1142 Type 2 diabetes mellitus with diabetic polyneuropathy: Secondary | ICD-10-CM | POA: Diagnosis not present

## 2018-08-29 DIAGNOSIS — E039 Hypothyroidism, unspecified: Secondary | ICD-10-CM | POA: Diagnosis not present

## 2018-08-29 DIAGNOSIS — Z794 Long term (current) use of insulin: Secondary | ICD-10-CM | POA: Diagnosis not present

## 2018-08-29 DIAGNOSIS — Z8673 Personal history of transient ischemic attack (TIA), and cerebral infarction without residual deficits: Secondary | ICD-10-CM | POA: Diagnosis not present

## 2018-08-29 DIAGNOSIS — N184 Chronic kidney disease, stage 4 (severe): Secondary | ICD-10-CM | POA: Diagnosis not present

## 2018-08-29 DIAGNOSIS — G4730 Sleep apnea, unspecified: Secondary | ICD-10-CM | POA: Diagnosis not present

## 2018-08-29 DIAGNOSIS — G894 Chronic pain syndrome: Secondary | ICD-10-CM | POA: Diagnosis not present

## 2018-08-29 DIAGNOSIS — I2510 Atherosclerotic heart disease of native coronary artery without angina pectoris: Secondary | ICD-10-CM | POA: Diagnosis not present

## 2018-08-29 DIAGNOSIS — E1151 Type 2 diabetes mellitus with diabetic peripheral angiopathy without gangrene: Secondary | ICD-10-CM | POA: Diagnosis not present

## 2018-08-29 DIAGNOSIS — M4800 Spinal stenosis, site unspecified: Secondary | ICD-10-CM | POA: Diagnosis not present

## 2018-08-29 DIAGNOSIS — I480 Paroxysmal atrial fibrillation: Secondary | ICD-10-CM | POA: Diagnosis not present

## 2018-08-29 DIAGNOSIS — Z9181 History of falling: Secondary | ICD-10-CM | POA: Diagnosis not present

## 2018-08-29 DIAGNOSIS — I509 Heart failure, unspecified: Secondary | ICD-10-CM | POA: Diagnosis not present

## 2018-08-29 DIAGNOSIS — K219 Gastro-esophageal reflux disease without esophagitis: Secondary | ICD-10-CM | POA: Diagnosis not present

## 2018-08-29 DIAGNOSIS — I130 Hypertensive heart and chronic kidney disease with heart failure and stage 1 through stage 4 chronic kidney disease, or unspecified chronic kidney disease: Secondary | ICD-10-CM | POA: Diagnosis not present

## 2018-08-29 NOTE — Telephone Encounter (Signed)
Received fax from [facility] on 08/27/2018 reporting patient' BP was 160/80 and pulse 88.  Per Dr. Diona Browner, patient's BPs at recent office visits were normal at 136/60 and 140/60.  [facility] was notified via fax to continue following BPs at home and if persistently up,  Dr. Diona Browner will change medication.

## 2018-08-30 ENCOUNTER — Telehealth: Payer: Self-pay | Admitting: Family Medicine

## 2018-08-30 DIAGNOSIS — N184 Chronic kidney disease, stage 4 (severe): Secondary | ICD-10-CM | POA: Diagnosis not present

## 2018-08-30 DIAGNOSIS — K219 Gastro-esophageal reflux disease without esophagitis: Secondary | ICD-10-CM | POA: Diagnosis not present

## 2018-08-30 DIAGNOSIS — I130 Hypertensive heart and chronic kidney disease with heart failure and stage 1 through stage 4 chronic kidney disease, or unspecified chronic kidney disease: Secondary | ICD-10-CM | POA: Diagnosis not present

## 2018-08-30 DIAGNOSIS — E1142 Type 2 diabetes mellitus with diabetic polyneuropathy: Secondary | ICD-10-CM | POA: Diagnosis not present

## 2018-08-30 DIAGNOSIS — I480 Paroxysmal atrial fibrillation: Secondary | ICD-10-CM | POA: Diagnosis not present

## 2018-08-30 DIAGNOSIS — Z794 Long term (current) use of insulin: Secondary | ICD-10-CM | POA: Diagnosis not present

## 2018-08-30 DIAGNOSIS — E1151 Type 2 diabetes mellitus with diabetic peripheral angiopathy without gangrene: Secondary | ICD-10-CM | POA: Diagnosis not present

## 2018-08-30 DIAGNOSIS — M170 Bilateral primary osteoarthritis of knee: Secondary | ICD-10-CM | POA: Diagnosis not present

## 2018-08-30 DIAGNOSIS — E1122 Type 2 diabetes mellitus with diabetic chronic kidney disease: Secondary | ICD-10-CM | POA: Diagnosis not present

## 2018-08-30 DIAGNOSIS — Z8673 Personal history of transient ischemic attack (TIA), and cerebral infarction without residual deficits: Secondary | ICD-10-CM | POA: Diagnosis not present

## 2018-08-30 DIAGNOSIS — E039 Hypothyroidism, unspecified: Secondary | ICD-10-CM | POA: Diagnosis not present

## 2018-08-30 DIAGNOSIS — Z9181 History of falling: Secondary | ICD-10-CM | POA: Diagnosis not present

## 2018-08-30 DIAGNOSIS — I2510 Atherosclerotic heart disease of native coronary artery without angina pectoris: Secondary | ICD-10-CM | POA: Diagnosis not present

## 2018-08-30 DIAGNOSIS — Z7982 Long term (current) use of aspirin: Secondary | ICD-10-CM | POA: Diagnosis not present

## 2018-08-30 DIAGNOSIS — G894 Chronic pain syndrome: Secondary | ICD-10-CM | POA: Diagnosis not present

## 2018-08-30 DIAGNOSIS — G4730 Sleep apnea, unspecified: Secondary | ICD-10-CM | POA: Diagnosis not present

## 2018-08-30 DIAGNOSIS — M4800 Spinal stenosis, site unspecified: Secondary | ICD-10-CM | POA: Diagnosis not present

## 2018-08-30 DIAGNOSIS — I509 Heart failure, unspecified: Secondary | ICD-10-CM | POA: Diagnosis not present

## 2018-08-30 NOTE — Telephone Encounter (Signed)
Left message for Karen [name] giving verbal okay for an additional social work visit in February.

## 2018-08-30 NOTE — Telephone Encounter (Signed)
Best number [phone] [place] [facility] called needing  verbal orders  Additional social work visit for month of feb Referring  pt for meals on wheels And pt wants ann ann miare to come back out with son present to review information about [employer] benefits and in home help

## 2018-08-31 DIAGNOSIS — H04123 Dry eye syndrome of bilateral lacrimal glands: Secondary | ICD-10-CM | POA: Diagnosis not present

## 2018-08-31 DIAGNOSIS — E113293 Type 2 diabetes mellitus with mild nonproliferative diabetic retinopathy without macular edema, bilateral: Secondary | ICD-10-CM | POA: Diagnosis not present

## 2018-08-31 DIAGNOSIS — H16143 Punctate keratitis, bilateral: Secondary | ICD-10-CM | POA: Diagnosis not present

## 2018-08-31 DIAGNOSIS — H353132 Nonexudative age-related macular degeneration, bilateral, intermediate dry stage: Secondary | ICD-10-CM | POA: Diagnosis not present

## 2018-09-03 ENCOUNTER — Encounter: Payer: Self-pay | Admitting: *Deleted

## 2018-09-03 ENCOUNTER — Other Ambulatory Visit: Payer: Self-pay | Admitting: *Deleted

## 2018-09-03 DIAGNOSIS — G4730 Sleep apnea, unspecified: Secondary | ICD-10-CM | POA: Diagnosis not present

## 2018-09-03 DIAGNOSIS — M170 Bilateral primary osteoarthritis of knee: Secondary | ICD-10-CM | POA: Diagnosis not present

## 2018-09-03 DIAGNOSIS — Z794 Long term (current) use of insulin: Secondary | ICD-10-CM | POA: Diagnosis not present

## 2018-09-03 DIAGNOSIS — K219 Gastro-esophageal reflux disease without esophagitis: Secondary | ICD-10-CM | POA: Diagnosis not present

## 2018-09-03 DIAGNOSIS — I480 Paroxysmal atrial fibrillation: Secondary | ICD-10-CM | POA: Diagnosis not present

## 2018-09-03 DIAGNOSIS — I509 Heart failure, unspecified: Secondary | ICD-10-CM | POA: Diagnosis not present

## 2018-09-03 DIAGNOSIS — I130 Hypertensive heart and chronic kidney disease with heart failure and stage 1 through stage 4 chronic kidney disease, or unspecified chronic kidney disease: Secondary | ICD-10-CM | POA: Diagnosis not present

## 2018-09-03 DIAGNOSIS — M4800 Spinal stenosis, site unspecified: Secondary | ICD-10-CM | POA: Diagnosis not present

## 2018-09-03 DIAGNOSIS — E039 Hypothyroidism, unspecified: Secondary | ICD-10-CM | POA: Diagnosis not present

## 2018-09-03 DIAGNOSIS — G894 Chronic pain syndrome: Secondary | ICD-10-CM | POA: Diagnosis not present

## 2018-09-03 DIAGNOSIS — Z9181 History of falling: Secondary | ICD-10-CM | POA: Diagnosis not present

## 2018-09-03 DIAGNOSIS — I2510 Atherosclerotic heart disease of native coronary artery without angina pectoris: Secondary | ICD-10-CM | POA: Diagnosis not present

## 2018-09-03 DIAGNOSIS — Z8673 Personal history of transient ischemic attack (TIA), and cerebral infarction without residual deficits: Secondary | ICD-10-CM | POA: Diagnosis not present

## 2018-09-03 DIAGNOSIS — E1122 Type 2 diabetes mellitus with diabetic chronic kidney disease: Secondary | ICD-10-CM | POA: Diagnosis not present

## 2018-09-03 DIAGNOSIS — N184 Chronic kidney disease, stage 4 (severe): Secondary | ICD-10-CM | POA: Diagnosis not present

## 2018-09-03 DIAGNOSIS — E1151 Type 2 diabetes mellitus with diabetic peripheral angiopathy without gangrene: Secondary | ICD-10-CM | POA: Diagnosis not present

## 2018-09-03 DIAGNOSIS — E1142 Type 2 diabetes mellitus with diabetic polyneuropathy: Secondary | ICD-10-CM | POA: Diagnosis not present

## 2018-09-03 DIAGNOSIS — Z7982 Long term (current) use of aspirin: Secondary | ICD-10-CM | POA: Diagnosis not present

## 2018-09-03 NOTE — Patient Outreach (Signed)
[facility]) Care Management [place]- SNF discharge day # 7 [place] Telephone Outreach Care Coordination - call to caregiver 09/03/2018  Karen [name] [birth year] [id number]  [place], 85 y/o female referred to [place] by ED RN CM after recent ED visit 06/26/18 for generalized weakness, dehydration, and fall. Patient was discharged from ED to SNF for rehabilitation.Patient has history including, but not limited to, HTN/ HLD; CKD- stage III; OSA; GERD; recent falls; DM- type II with neuropathy; pA-Fib; previous TIA/ CVA; and anxiety.HIPAA/ identity verified with patient during phone call today; patient reports that she is doing "better," and reports that she had a recent "issue with taking the wrong eye drops;" reports that she mistakenly put blood sugar test strip solution in her eye, instead of her regularly prescribed eye drops.  Reports that her son had to call the doctor and take her urgently to an eye specialist.  Reports "much better now."  Patient denies pain and new/ recent falls today; states home health team "just left."  Endorses ongoing active participation in home health services.  Patient stated that she attended recent endocrinology provider appointment and is able to verbalize dosing changes to overall insulin administration.  Patient denies recent blood sugars < 70, although she does report a "few that have been in the 80's."  Reports fasting ranges over last week 90-117 and post-prandial/ night time ranges 100-200; reports fasting blood sugar this morning of "98," and "111 before bed last night."  Patient denies having recent signs/ symptoms low blood sugars, and using teach back method, discussed all signs/ symptoms along with action plan with patient, reiterating previously provided printed educational material mailed to patient.  Confirmed that patient  continues monitoring and recording blood sugars 2-3 times per day; encouraged her to continue and to take all recorded blood sugars with her to provider appointments.   I then contacted patient's son/ caregiver [name]; HIPAA/ identity verified with patient's son.  Today caregiver reports that "things seem to be going better" around patient's general condition; reports he will provide transportation to follow up eye doctor appointment tomorrow and he confirms that patient has not had any new/ recent falls.  States he and his sister and his wife continue reviewing patient's daily recorded blood sugars at home, and have continued working with patient to ensure she is eating a healthy diet.  Patient and caregiverboth deny further issues, concerns, or problems today. Iprovided/confirmed that both havemy direct phone number, the main [facility] CM office phone number, and the [facility] CM 24-hour nurse advice phone number should issues arise prior to next scheduled [place] outreach by telephone. Encouraged both patient and caregiverto contact me directly if needs, questions, issues, or concerns arise prior to next scheduled outreach;theyagreed to do so.  Plan:  Patient will take medications as prescribed and will attend all scheduled provider appointments  Patient will promptly notify care providers for any new concerns/ issues/ problems that arise  Patient will actively participate in home health services as ordered post-hospital discharge  Patient will continueusing assistive devices for ambulation  [facility] CM outreachto continue with scheduled phone callto patient and to caregiverin 3 weeks  [facility] CM Care Plan Problem Two     Most Recent Value  Care Plan Problem Two  High fall risk as evidenced by multiple recent falls reported by patient with subsequent ED visits/ SNF admission for rehabilitation  Role Documenting the Problem Two  Care Management Coordinator  Care Plan  for Problem Two  Active  Interventions for Problem Two Long Term Goal   Discussed with patient and caregiver patient's current use of assistive devices,  provided positive reinforcement that patient has not had any new/ recent falls and continues working with home health PT,  fall prevention again discussed with patient and caregiver  [facility] Long Term Goal  Over the next 45 days patient will use assistive devices for ambulation, as evidenced by patient reporting during [facility] RN CCM outreach  [facility] Long Term Goal Start Date  07/20/18  [facility] Long Term Goal Met Date  09/03/18  [facility] CM Short Term Goal #1   Over the next 22 days, patient will continue actively participating in home health PT services as evidenced by patient/ caregiver reporting and collaboration with home health team as indicated during [facility] RN CCm outreach   [facility] CM Short Term Goal #1 Start Date  09/03/18  Interventions for Short Term Goal #2   Discussed with patient and caregiver current home health services in place- confirmed that patient continues actively participating in home health services for RN/ PT/ bath aide- encouraged patient's ongoing active participation in all disciplines    [facility] CM Care Plan Problem Three     Most Recent Value  Care Plan Problem Three  Self-health management of chronic disease state of DM, as evidenced by patient reporting  Role Documenting the Problem Three  Care Management Coordinator  Care Plan for Problem Three  Active  [facility] Long Term Goal   Over the next 45 days, patient will verbalize 3 signs/ symptoms of hypoglycemia along with action plan for same, as evidenced by patient reporting during [place] outreach  [facility] Long Term Goal Start Date  08/08/18  Interventions for Problem Three Long Term Goal  Confirmed that patient received previously mailed printed EMMI education around low blood sugar,  using teach back method, reviewed with patient signs/ symptoms of hypoglycemia along with corresponding action plan,   encouraged patient's caregiver to review thoroughly as well,  discussed use of prn glucagon, prescribed at time of recent endocrinology provider appointment.  reviewed with patient recent blood sugar ranges and encouraged caregiver and patient both to report any consistently low blood sugars to endocrinology provider  [facility] CM Short Term Goal #1   Over the next 30 days, patient will schedule and attend recently referred endocrinology provider appointment, as evidenced by patient reporting, review of EMR, and collaboration with care providers as indicated during [place] outreach   [facility] CM Short Term Goal #1 Start Date  08/08/18  [facility] CM Short Term Goal #1 Met Date  09/03/18 [facility] Met]  Interventions for Short Term Goal #1  reviewed recent endocrinology office visit with patient and caregiver and confirmed that patient is able to verbalize new insulin dosing guidelines and has obtained newly ordered glucagon post-office visit     Oneta Rack, RN, BSN, [employer] Coordinator [place] Management  [phone]

## 2018-09-04 DIAGNOSIS — Z8673 Personal history of transient ischemic attack (TIA), and cerebral infarction without residual deficits: Secondary | ICD-10-CM | POA: Diagnosis not present

## 2018-09-04 DIAGNOSIS — E113293 Type 2 diabetes mellitus with mild nonproliferative diabetic retinopathy without macular edema, bilateral: Secondary | ICD-10-CM | POA: Diagnosis not present

## 2018-09-04 DIAGNOSIS — G4730 Sleep apnea, unspecified: Secondary | ICD-10-CM | POA: Diagnosis not present

## 2018-09-04 DIAGNOSIS — N184 Chronic kidney disease, stage 4 (severe): Secondary | ICD-10-CM | POA: Diagnosis not present

## 2018-09-04 DIAGNOSIS — I2510 Atherosclerotic heart disease of native coronary artery without angina pectoris: Secondary | ICD-10-CM | POA: Diagnosis not present

## 2018-09-04 DIAGNOSIS — I480 Paroxysmal atrial fibrillation: Secondary | ICD-10-CM | POA: Diagnosis not present

## 2018-09-04 DIAGNOSIS — M170 Bilateral primary osteoarthritis of knee: Secondary | ICD-10-CM | POA: Diagnosis not present

## 2018-09-04 DIAGNOSIS — I509 Heart failure, unspecified: Secondary | ICD-10-CM | POA: Diagnosis not present

## 2018-09-04 DIAGNOSIS — E1142 Type 2 diabetes mellitus with diabetic polyneuropathy: Secondary | ICD-10-CM | POA: Diagnosis not present

## 2018-09-04 DIAGNOSIS — H353132 Nonexudative age-related macular degeneration, bilateral, intermediate dry stage: Secondary | ICD-10-CM | POA: Diagnosis not present

## 2018-09-04 DIAGNOSIS — H16143 Punctate keratitis, bilateral: Secondary | ICD-10-CM | POA: Diagnosis not present

## 2018-09-04 DIAGNOSIS — I130 Hypertensive heart and chronic kidney disease with heart failure and stage 1 through stage 4 chronic kidney disease, or unspecified chronic kidney disease: Secondary | ICD-10-CM | POA: Diagnosis not present

## 2018-09-04 DIAGNOSIS — E039 Hypothyroidism, unspecified: Secondary | ICD-10-CM | POA: Diagnosis not present

## 2018-09-04 DIAGNOSIS — E1122 Type 2 diabetes mellitus with diabetic chronic kidney disease: Secondary | ICD-10-CM | POA: Diagnosis not present

## 2018-09-04 DIAGNOSIS — M4800 Spinal stenosis, site unspecified: Secondary | ICD-10-CM | POA: Diagnosis not present

## 2018-09-04 DIAGNOSIS — Z9181 History of falling: Secondary | ICD-10-CM | POA: Diagnosis not present

## 2018-09-04 DIAGNOSIS — Z794 Long term (current) use of insulin: Secondary | ICD-10-CM | POA: Diagnosis not present

## 2018-09-04 DIAGNOSIS — K219 Gastro-esophageal reflux disease without esophagitis: Secondary | ICD-10-CM | POA: Diagnosis not present

## 2018-09-04 DIAGNOSIS — G894 Chronic pain syndrome: Secondary | ICD-10-CM | POA: Diagnosis not present

## 2018-09-04 DIAGNOSIS — E1151 Type 2 diabetes mellitus with diabetic peripheral angiopathy without gangrene: Secondary | ICD-10-CM | POA: Diagnosis not present

## 2018-09-04 DIAGNOSIS — Z7982 Long term (current) use of aspirin: Secondary | ICD-10-CM | POA: Diagnosis not present

## 2018-09-04 DIAGNOSIS — H04123 Dry eye syndrome of bilateral lacrimal glands: Secondary | ICD-10-CM | POA: Diagnosis not present

## 2018-09-05 DIAGNOSIS — E1142 Type 2 diabetes mellitus with diabetic polyneuropathy: Secondary | ICD-10-CM | POA: Diagnosis not present

## 2018-09-05 DIAGNOSIS — G894 Chronic pain syndrome: Secondary | ICD-10-CM | POA: Diagnosis not present

## 2018-09-05 DIAGNOSIS — G4730 Sleep apnea, unspecified: Secondary | ICD-10-CM | POA: Diagnosis not present

## 2018-09-05 DIAGNOSIS — M170 Bilateral primary osteoarthritis of knee: Secondary | ICD-10-CM | POA: Diagnosis not present

## 2018-09-05 DIAGNOSIS — Z7982 Long term (current) use of aspirin: Secondary | ICD-10-CM | POA: Diagnosis not present

## 2018-09-05 DIAGNOSIS — Z8673 Personal history of transient ischemic attack (TIA), and cerebral infarction without residual deficits: Secondary | ICD-10-CM | POA: Diagnosis not present

## 2018-09-05 DIAGNOSIS — N184 Chronic kidney disease, stage 4 (severe): Secondary | ICD-10-CM | POA: Diagnosis not present

## 2018-09-05 DIAGNOSIS — I2510 Atherosclerotic heart disease of native coronary artery without angina pectoris: Secondary | ICD-10-CM | POA: Diagnosis not present

## 2018-09-05 DIAGNOSIS — I509 Heart failure, unspecified: Secondary | ICD-10-CM | POA: Diagnosis not present

## 2018-09-05 DIAGNOSIS — E039 Hypothyroidism, unspecified: Secondary | ICD-10-CM | POA: Diagnosis not present

## 2018-09-05 DIAGNOSIS — I480 Paroxysmal atrial fibrillation: Secondary | ICD-10-CM | POA: Diagnosis not present

## 2018-09-05 DIAGNOSIS — K219 Gastro-esophageal reflux disease without esophagitis: Secondary | ICD-10-CM | POA: Diagnosis not present

## 2018-09-05 DIAGNOSIS — I130 Hypertensive heart and chronic kidney disease with heart failure and stage 1 through stage 4 chronic kidney disease, or unspecified chronic kidney disease: Secondary | ICD-10-CM | POA: Diagnosis not present

## 2018-09-05 DIAGNOSIS — M4800 Spinal stenosis, site unspecified: Secondary | ICD-10-CM | POA: Diagnosis not present

## 2018-09-05 DIAGNOSIS — E1151 Type 2 diabetes mellitus with diabetic peripheral angiopathy without gangrene: Secondary | ICD-10-CM | POA: Diagnosis not present

## 2018-09-05 DIAGNOSIS — Z9181 History of falling: Secondary | ICD-10-CM | POA: Diagnosis not present

## 2018-09-05 DIAGNOSIS — E1122 Type 2 diabetes mellitus with diabetic chronic kidney disease: Secondary | ICD-10-CM | POA: Diagnosis not present

## 2018-09-05 DIAGNOSIS — Z794 Long term (current) use of insulin: Secondary | ICD-10-CM | POA: Diagnosis not present

## 2018-09-07 ENCOUNTER — Other Ambulatory Visit: Payer: Self-pay | Admitting: Family Medicine

## 2018-09-07 DIAGNOSIS — I480 Paroxysmal atrial fibrillation: Secondary | ICD-10-CM | POA: Diagnosis not present

## 2018-09-07 DIAGNOSIS — K219 Gastro-esophageal reflux disease without esophagitis: Secondary | ICD-10-CM | POA: Diagnosis not present

## 2018-09-07 DIAGNOSIS — I2510 Atherosclerotic heart disease of native coronary artery without angina pectoris: Secondary | ICD-10-CM | POA: Diagnosis not present

## 2018-09-07 DIAGNOSIS — I130 Hypertensive heart and chronic kidney disease with heart failure and stage 1 through stage 4 chronic kidney disease, or unspecified chronic kidney disease: Secondary | ICD-10-CM | POA: Diagnosis not present

## 2018-09-07 DIAGNOSIS — G894 Chronic pain syndrome: Secondary | ICD-10-CM | POA: Diagnosis not present

## 2018-09-07 DIAGNOSIS — E1142 Type 2 diabetes mellitus with diabetic polyneuropathy: Secondary | ICD-10-CM | POA: Diagnosis not present

## 2018-09-07 DIAGNOSIS — Z794 Long term (current) use of insulin: Secondary | ICD-10-CM | POA: Diagnosis not present

## 2018-09-07 DIAGNOSIS — M4800 Spinal stenosis, site unspecified: Secondary | ICD-10-CM | POA: Diagnosis not present

## 2018-09-07 DIAGNOSIS — N184 Chronic kidney disease, stage 4 (severe): Secondary | ICD-10-CM | POA: Diagnosis not present

## 2018-09-07 DIAGNOSIS — M170 Bilateral primary osteoarthritis of knee: Secondary | ICD-10-CM | POA: Diagnosis not present

## 2018-09-07 DIAGNOSIS — G4730 Sleep apnea, unspecified: Secondary | ICD-10-CM | POA: Diagnosis not present

## 2018-09-07 DIAGNOSIS — E039 Hypothyroidism, unspecified: Secondary | ICD-10-CM | POA: Diagnosis not present

## 2018-09-07 DIAGNOSIS — I509 Heart failure, unspecified: Secondary | ICD-10-CM | POA: Diagnosis not present

## 2018-09-07 DIAGNOSIS — Z8673 Personal history of transient ischemic attack (TIA), and cerebral infarction without residual deficits: Secondary | ICD-10-CM | POA: Diagnosis not present

## 2018-09-07 DIAGNOSIS — Z9181 History of falling: Secondary | ICD-10-CM | POA: Diagnosis not present

## 2018-09-07 DIAGNOSIS — Z7982 Long term (current) use of aspirin: Secondary | ICD-10-CM | POA: Diagnosis not present

## 2018-09-07 DIAGNOSIS — E1122 Type 2 diabetes mellitus with diabetic chronic kidney disease: Secondary | ICD-10-CM | POA: Diagnosis not present

## 2018-09-07 DIAGNOSIS — E1151 Type 2 diabetes mellitus with diabetic peripheral angiopathy without gangrene: Secondary | ICD-10-CM | POA: Diagnosis not present

## 2018-09-10 DIAGNOSIS — M4800 Spinal stenosis, site unspecified: Secondary | ICD-10-CM | POA: Diagnosis not present

## 2018-09-10 DIAGNOSIS — E1151 Type 2 diabetes mellitus with diabetic peripheral angiopathy without gangrene: Secondary | ICD-10-CM | POA: Diagnosis not present

## 2018-09-10 DIAGNOSIS — I2510 Atherosclerotic heart disease of native coronary artery without angina pectoris: Secondary | ICD-10-CM | POA: Diagnosis not present

## 2018-09-10 DIAGNOSIS — G894 Chronic pain syndrome: Secondary | ICD-10-CM | POA: Diagnosis not present

## 2018-09-10 DIAGNOSIS — N184 Chronic kidney disease, stage 4 (severe): Secondary | ICD-10-CM | POA: Diagnosis not present

## 2018-09-10 DIAGNOSIS — E1142 Type 2 diabetes mellitus with diabetic polyneuropathy: Secondary | ICD-10-CM | POA: Diagnosis not present

## 2018-09-10 DIAGNOSIS — Z8673 Personal history of transient ischemic attack (TIA), and cerebral infarction without residual deficits: Secondary | ICD-10-CM | POA: Diagnosis not present

## 2018-09-10 DIAGNOSIS — E039 Hypothyroidism, unspecified: Secondary | ICD-10-CM | POA: Diagnosis not present

## 2018-09-10 DIAGNOSIS — M170 Bilateral primary osteoarthritis of knee: Secondary | ICD-10-CM | POA: Diagnosis not present

## 2018-09-10 DIAGNOSIS — G4730 Sleep apnea, unspecified: Secondary | ICD-10-CM | POA: Diagnosis not present

## 2018-09-10 DIAGNOSIS — Z9181 History of falling: Secondary | ICD-10-CM | POA: Diagnosis not present

## 2018-09-10 DIAGNOSIS — Z794 Long term (current) use of insulin: Secondary | ICD-10-CM | POA: Diagnosis not present

## 2018-09-10 DIAGNOSIS — I509 Heart failure, unspecified: Secondary | ICD-10-CM | POA: Diagnosis not present

## 2018-09-10 DIAGNOSIS — Z7982 Long term (current) use of aspirin: Secondary | ICD-10-CM | POA: Diagnosis not present

## 2018-09-10 DIAGNOSIS — E1122 Type 2 diabetes mellitus with diabetic chronic kidney disease: Secondary | ICD-10-CM | POA: Diagnosis not present

## 2018-09-10 DIAGNOSIS — K219 Gastro-esophageal reflux disease without esophagitis: Secondary | ICD-10-CM | POA: Diagnosis not present

## 2018-09-10 DIAGNOSIS — I480 Paroxysmal atrial fibrillation: Secondary | ICD-10-CM | POA: Diagnosis not present

## 2018-09-10 DIAGNOSIS — I130 Hypertensive heart and chronic kidney disease with heart failure and stage 1 through stage 4 chronic kidney disease, or unspecified chronic kidney disease: Secondary | ICD-10-CM | POA: Diagnosis not present

## 2018-09-11 DIAGNOSIS — E039 Hypothyroidism, unspecified: Secondary | ICD-10-CM | POA: Diagnosis not present

## 2018-09-11 DIAGNOSIS — I2510 Atherosclerotic heart disease of native coronary artery without angina pectoris: Secondary | ICD-10-CM | POA: Diagnosis not present

## 2018-09-11 DIAGNOSIS — E1122 Type 2 diabetes mellitus with diabetic chronic kidney disease: Secondary | ICD-10-CM | POA: Diagnosis not present

## 2018-09-11 DIAGNOSIS — N184 Chronic kidney disease, stage 4 (severe): Secondary | ICD-10-CM | POA: Diagnosis not present

## 2018-09-11 DIAGNOSIS — K219 Gastro-esophageal reflux disease without esophagitis: Secondary | ICD-10-CM | POA: Diagnosis not present

## 2018-09-11 DIAGNOSIS — E1151 Type 2 diabetes mellitus with diabetic peripheral angiopathy without gangrene: Secondary | ICD-10-CM | POA: Diagnosis not present

## 2018-09-11 DIAGNOSIS — Z9181 History of falling: Secondary | ICD-10-CM | POA: Diagnosis not present

## 2018-09-11 DIAGNOSIS — M4800 Spinal stenosis, site unspecified: Secondary | ICD-10-CM | POA: Diagnosis not present

## 2018-09-11 DIAGNOSIS — Z7982 Long term (current) use of aspirin: Secondary | ICD-10-CM | POA: Diagnosis not present

## 2018-09-11 DIAGNOSIS — I480 Paroxysmal atrial fibrillation: Secondary | ICD-10-CM | POA: Diagnosis not present

## 2018-09-11 DIAGNOSIS — Z8673 Personal history of transient ischemic attack (TIA), and cerebral infarction without residual deficits: Secondary | ICD-10-CM | POA: Diagnosis not present

## 2018-09-11 DIAGNOSIS — G894 Chronic pain syndrome: Secondary | ICD-10-CM | POA: Diagnosis not present

## 2018-09-11 DIAGNOSIS — I130 Hypertensive heart and chronic kidney disease with heart failure and stage 1 through stage 4 chronic kidney disease, or unspecified chronic kidney disease: Secondary | ICD-10-CM | POA: Diagnosis not present

## 2018-09-11 DIAGNOSIS — E1142 Type 2 diabetes mellitus with diabetic polyneuropathy: Secondary | ICD-10-CM | POA: Diagnosis not present

## 2018-09-11 DIAGNOSIS — G4730 Sleep apnea, unspecified: Secondary | ICD-10-CM | POA: Diagnosis not present

## 2018-09-11 DIAGNOSIS — M170 Bilateral primary osteoarthritis of knee: Secondary | ICD-10-CM | POA: Diagnosis not present

## 2018-09-11 DIAGNOSIS — I509 Heart failure, unspecified: Secondary | ICD-10-CM | POA: Diagnosis not present

## 2018-09-11 DIAGNOSIS — Z794 Long term (current) use of insulin: Secondary | ICD-10-CM | POA: Diagnosis not present

## 2018-09-12 DIAGNOSIS — Z9181 History of falling: Secondary | ICD-10-CM | POA: Diagnosis not present

## 2018-09-12 DIAGNOSIS — I2510 Atherosclerotic heart disease of native coronary artery without angina pectoris: Secondary | ICD-10-CM | POA: Diagnosis not present

## 2018-09-12 DIAGNOSIS — E1142 Type 2 diabetes mellitus with diabetic polyneuropathy: Secondary | ICD-10-CM | POA: Diagnosis not present

## 2018-09-12 DIAGNOSIS — M4800 Spinal stenosis, site unspecified: Secondary | ICD-10-CM | POA: Diagnosis not present

## 2018-09-12 DIAGNOSIS — Z8673 Personal history of transient ischemic attack (TIA), and cerebral infarction without residual deficits: Secondary | ICD-10-CM | POA: Diagnosis not present

## 2018-09-12 DIAGNOSIS — Z7982 Long term (current) use of aspirin: Secondary | ICD-10-CM | POA: Diagnosis not present

## 2018-09-12 DIAGNOSIS — I509 Heart failure, unspecified: Secondary | ICD-10-CM | POA: Diagnosis not present

## 2018-09-12 DIAGNOSIS — M170 Bilateral primary osteoarthritis of knee: Secondary | ICD-10-CM | POA: Diagnosis not present

## 2018-09-12 DIAGNOSIS — E039 Hypothyroidism, unspecified: Secondary | ICD-10-CM | POA: Diagnosis not present

## 2018-09-12 DIAGNOSIS — G894 Chronic pain syndrome: Secondary | ICD-10-CM | POA: Diagnosis not present

## 2018-09-12 DIAGNOSIS — G4730 Sleep apnea, unspecified: Secondary | ICD-10-CM | POA: Diagnosis not present

## 2018-09-12 DIAGNOSIS — E1151 Type 2 diabetes mellitus with diabetic peripheral angiopathy without gangrene: Secondary | ICD-10-CM | POA: Diagnosis not present

## 2018-09-12 DIAGNOSIS — I480 Paroxysmal atrial fibrillation: Secondary | ICD-10-CM | POA: Diagnosis not present

## 2018-09-12 DIAGNOSIS — E1122 Type 2 diabetes mellitus with diabetic chronic kidney disease: Secondary | ICD-10-CM | POA: Diagnosis not present

## 2018-09-12 DIAGNOSIS — N184 Chronic kidney disease, stage 4 (severe): Secondary | ICD-10-CM | POA: Diagnosis not present

## 2018-09-12 DIAGNOSIS — I130 Hypertensive heart and chronic kidney disease with heart failure and stage 1 through stage 4 chronic kidney disease, or unspecified chronic kidney disease: Secondary | ICD-10-CM | POA: Diagnosis not present

## 2018-09-12 DIAGNOSIS — Z794 Long term (current) use of insulin: Secondary | ICD-10-CM | POA: Diagnosis not present

## 2018-09-12 DIAGNOSIS — K219 Gastro-esophageal reflux disease without esophagitis: Secondary | ICD-10-CM | POA: Diagnosis not present

## 2018-09-13 ENCOUNTER — Other Ambulatory Visit: Payer: Self-pay | Admitting: Family Medicine

## 2018-09-13 DIAGNOSIS — I509 Heart failure, unspecified: Secondary | ICD-10-CM | POA: Diagnosis not present

## 2018-09-13 DIAGNOSIS — I480 Paroxysmal atrial fibrillation: Secondary | ICD-10-CM | POA: Diagnosis not present

## 2018-09-13 DIAGNOSIS — E039 Hypothyroidism, unspecified: Secondary | ICD-10-CM | POA: Diagnosis not present

## 2018-09-13 DIAGNOSIS — Z794 Long term (current) use of insulin: Secondary | ICD-10-CM | POA: Diagnosis not present

## 2018-09-13 DIAGNOSIS — I2510 Atherosclerotic heart disease of native coronary artery without angina pectoris: Secondary | ICD-10-CM | POA: Diagnosis not present

## 2018-09-13 DIAGNOSIS — N184 Chronic kidney disease, stage 4 (severe): Secondary | ICD-10-CM | POA: Diagnosis not present

## 2018-09-13 DIAGNOSIS — G894 Chronic pain syndrome: Secondary | ICD-10-CM | POA: Diagnosis not present

## 2018-09-13 DIAGNOSIS — E1142 Type 2 diabetes mellitus with diabetic polyneuropathy: Secondary | ICD-10-CM | POA: Diagnosis not present

## 2018-09-13 DIAGNOSIS — I130 Hypertensive heart and chronic kidney disease with heart failure and stage 1 through stage 4 chronic kidney disease, or unspecified chronic kidney disease: Secondary | ICD-10-CM | POA: Diagnosis not present

## 2018-09-13 DIAGNOSIS — F419 Anxiety disorder, unspecified: Secondary | ICD-10-CM

## 2018-09-13 DIAGNOSIS — M170 Bilateral primary osteoarthritis of knee: Secondary | ICD-10-CM | POA: Diagnosis not present

## 2018-09-13 DIAGNOSIS — K219 Gastro-esophageal reflux disease without esophagitis: Secondary | ICD-10-CM | POA: Diagnosis not present

## 2018-09-13 DIAGNOSIS — Z7982 Long term (current) use of aspirin: Secondary | ICD-10-CM | POA: Diagnosis not present

## 2018-09-13 DIAGNOSIS — Z9181 History of falling: Secondary | ICD-10-CM | POA: Diagnosis not present

## 2018-09-13 DIAGNOSIS — E1122 Type 2 diabetes mellitus with diabetic chronic kidney disease: Secondary | ICD-10-CM | POA: Diagnosis not present

## 2018-09-13 DIAGNOSIS — Z8673 Personal history of transient ischemic attack (TIA), and cerebral infarction without residual deficits: Secondary | ICD-10-CM | POA: Diagnosis not present

## 2018-09-13 DIAGNOSIS — E1151 Type 2 diabetes mellitus with diabetic peripheral angiopathy without gangrene: Secondary | ICD-10-CM | POA: Diagnosis not present

## 2018-09-13 DIAGNOSIS — M4800 Spinal stenosis, site unspecified: Secondary | ICD-10-CM | POA: Diagnosis not present

## 2018-09-13 DIAGNOSIS — G4730 Sleep apnea, unspecified: Secondary | ICD-10-CM | POA: Diagnosis not present

## 2018-09-13 NOTE — Telephone Encounter (Signed)
Please advise on xanax refill  Last filled-08/03/18 qty 120 tablets Last OV-08/03/18, pending ov 11/02/18

## 2018-09-16 DIAGNOSIS — R2689 Other abnormalities of gait and mobility: Secondary | ICD-10-CM | POA: Diagnosis not present

## 2018-09-16 DIAGNOSIS — M179 Osteoarthritis of knee, unspecified: Secondary | ICD-10-CM | POA: Diagnosis not present

## 2018-09-16 DIAGNOSIS — G894 Chronic pain syndrome: Secondary | ICD-10-CM | POA: Diagnosis not present

## 2018-09-16 DIAGNOSIS — K219 Gastro-esophageal reflux disease without esophagitis: Secondary | ICD-10-CM | POA: Diagnosis not present

## 2018-09-19 DIAGNOSIS — M170 Bilateral primary osteoarthritis of knee: Secondary | ICD-10-CM | POA: Diagnosis not present

## 2018-09-24 ENCOUNTER — Encounter: Payer: Self-pay | Admitting: *Deleted

## 2018-09-24 ENCOUNTER — Other Ambulatory Visit: Payer: Self-pay | Admitting: *Deleted

## 2018-09-24 NOTE — Patient Outreach (Signed)
Karen [name]) Care Management [place]- SNF discharge day # 84 [place] Coordination x 1- call to caregiver/ son   09/24/2018  Karen [name] [birth year] [id number]  [place], 85 y/o female referred to [place] by ED RN CM after recent ED visit 06/26/18 for generalized weakness, dehydration, and fall. Patient was discharged from ED to SNF for rehabilitation.Patient has history including, but not limited to, HTN/ HLD; CKD- stage III; OSA; GERD; recent falls; DM- type II with neuropathy; pA-Fib; previous TIA/ CVA; and anxiety.  HIPAA/ identity verified with patient during phone call today; patient again reports that she is doing "much better." Patient denies new/ recent falls and states that her ongoing chronic pain is "about the same as always."  Reports pain managed well by current medications.  Patient sounds to be in no obvious distress throughout phone call today.  Patient further reports:  -- home health services "now over;" states that she has not had home health team visit "for about 2 weeks;" states that she "felt ready" for home health discharge and confirms that she tries to continue doing exercises as taught by home health PT team -- continues using wheelchair/ walker for all ambulation around home; as noted above, denies falls since last [place] outreach 3 weeks ago-- positive reinforcement provided, and previously provided education around fall risks/ prevention re-iterated with patient today, who verbalizes a good understanding of same -- previously reported issue with eye "now better;" patient had previously reported that she mistakenly put CBG test meter solution in her eye, thinking it was an eye drop; states that this is better now and confirms that she has a scheduled follow up appointment with eye doctor on October 03, 2018-- reports that she  plans on taking "bus" to eye doctor appointment, but can not remember the name of the transportation service she plans on using -- denies recent episodes hypoglycemia, and is able to accurately verbalize signs/ symptoms of same.  We  Reviewed recently recorded blood sugars at home, and patient reports "most fasting blood sugars between 100-150;" reports occasional fasting blood sugars "between 89-100;" but denies that this occurs very often.  Denies that she has had any recent fasting blood sugars < 89. Reports post-prandial blood sugars "mainly between 200-250;" confirms that she continues checking blood sugar readings 2-4 times per day.  Patient remains able to verbalize accurate report of insulin dosing instructions and verbalizes adherence to following instructions around dosing.  I then contacted patient's son/ caregiver Karen [name] whom patient provided verbal consent to speak with; HIPAA/ identity verified with patient's son.  Today caregiver also again reports that "things are going much better" and he confirms that patient has had no recent concerns/ issues/ problems/ falls.  Reports that he and his sister have been working through patient's veterans administration benefits around "aid and attendance" benefit available to her, and also reports that they have contacted "Aging and Adult services" through [place] around in-home care assistance available to her.  Son/ caregiver also confirms that patient is now on waiting list for Meals on Wheels through [employer] [place], and confirms that patient is now established with [employer] through that organization.  Caregiver again confirms that family members continue reviewing patient's daily recorded blood sugars at home, and have continued working with patient to ensure she is eating a healthy diet.  Patient and caregiverboth deny further issues, concerns, or problems  today. Iconfirmed thatboth havemy direct phone  number, the main [facility] CM office phone number, and the [facility] CM 24-hour nurse advice phone number should issues arise prior to next scheduled [place] outreach by telephone. Discussed with both that patient has thus far made very progress in meeting her previously established [place] CM goals, and discussed possibility of [facility] CCM case closure at time of next outreach in 3 weeks; both are agreeable to this.  Encouraged both patient and caregiverto contact me directly if needs, questions, issues, or concerns arise prior to next scheduled outreach;theyagreed to do so.  Plan:  Patient will take medications as prescribed and will attend all scheduled provider appointments  Patient will promptly notify care providers for any new concerns/ issues/ problems that arise  Patient will continueusing assistive devices for ambulation  [facility] CM outreachto continue with scheduled phone callto patient and to caregiverin 3weeks, possibly for case clsoure  [facility] CM Care Plan Problem Two     Most Recent Value  Care Plan Problem Two  High fall risk as evidenced by multiple recent falls reported by patient with subsequent ED visits/ SNF admission for rehabilitation  Role Documenting the Problem Two  Care Management Coordinator  Care Plan for Problem Two  Not Active  [facility] Long Term Goal  Over the next 45 days patient will use assistive devices for ambulation, as evidenced by patient reporting during [facility] RN CCM outreach  [facility] Long Term Goal Start Date  07/20/18  [facility] Long Term Goal Met Date  09/03/18 - Goal Met  [facility] CM Short Term Goal #1   Over the next 22 days, patient will continue actively participating in home health PT services as evidenced by patient/ caregiver reporting and collaboration with home health team as indicated during [facility] RN CCm outreach   [facility] CM Short Term Goal #1 Start Date  09/03/18  [facility] CM Short Term Goal #1 Met Date   09/24/18 [Goal Met]  Interventions for Short Term  Goal #2   Confirmed with patient that home health services have now ended and that patient has not experienced any further falls in home,  previously provided fall risks and prevention education was reitarated with patient and caregiver today    [facility] CM Care Plan Problem Three     Most Recent Value  Care Plan Problem Three  Self-health management of chronic disease state of DM, as evidenced by patient reporting  Role Documenting the Problem Three  Care Management Coordinator  Care Plan for Problem Three  Active  [facility] Long Term Goal   Over the next 45 days, patient will verbalize 3 signs/ symptoms of hypoglycemia along with action plan for same, as evidenced by patient reporting during [facility] RN CCM outreach  [facility] Long Term Goal Start Date  08/08/18  [facility] Long Term Goal Met Date  09/24/18 [facility] Met]  Interventions for Problem Three Long Term Goal  Confirmed that patient is able to verbalize signs/ symptoms hypoglycemia and that she denies having recent signs/ symptoms of low blood sugar,  reviewed recent blood sugars at home with patient and confirmed that patient remains able to accurately verbalize insulin dosing instructions and reports adherent to current insulin dosing instructions  [facility] CM Short Term Goal #1   Over the next 22 days, patient/ caregiver will notify care providers for any new concerns, issues, or problems that arise, as evidenced by patient and caregiver reporting and collaboration with care providers as indicated, during [facility] RN CCM outreach  [facility] CM Short Term Goal #1 Start Date  09/24/18  Interventions for Short Term Goal #1  Reviewed and reiterated with patient and caregiver previously provided education around signs/ symptoms hypoglycemia and MI/ CVA,  fall prevention,  encouraged patient and caregiver to promptly notify care providers for any new concerns and confirmed that both have contact information for care providers      Oneta Rack, RN, BSN, [employer] [facility] Care Management  [phone]

## 2018-09-30 ENCOUNTER — Emergency Department (HOSPITAL_COMMUNITY): Payer: Medicare Other

## 2018-09-30 ENCOUNTER — Inpatient Hospital Stay (HOSPITAL_COMMUNITY)
Admission: EM | Admit: 2018-09-30 | Discharge: 2018-10-08 | DRG: 280 | Disposition: A | Discharge status: Skilled Nursing Facility | Payer: Medicare Other | Attending: Internal Medicine | Admitting: Internal Medicine

## 2018-09-30 DIAGNOSIS — I214 Non-ST elevation (NSTEMI) myocardial infarction: Secondary | ICD-10-CM | POA: Diagnosis not present

## 2018-09-30 DIAGNOSIS — K3184 Gastroparesis: Secondary | ICD-10-CM | POA: Diagnosis not present

## 2018-09-30 DIAGNOSIS — Z66 Do not resuscitate: Secondary | ICD-10-CM | POA: Diagnosis not present

## 2018-09-30 DIAGNOSIS — M4800 Spinal stenosis, site unspecified: Secondary | ICD-10-CM | POA: Diagnosis present

## 2018-09-30 DIAGNOSIS — R10819 Abdominal tenderness, unspecified site: Secondary | ICD-10-CM | POA: Diagnosis not present

## 2018-09-30 DIAGNOSIS — R0789 Other chest pain: Secondary | ICD-10-CM

## 2018-09-30 DIAGNOSIS — R001 Bradycardia, unspecified: Secondary | ICD-10-CM | POA: Diagnosis not present

## 2018-09-30 DIAGNOSIS — K5900 Constipation, unspecified: Secondary | ICD-10-CM | POA: Diagnosis present

## 2018-09-30 DIAGNOSIS — Z85828 Personal history of other malignant neoplasm of skin: Secondary | ICD-10-CM

## 2018-09-30 DIAGNOSIS — Z8673 Personal history of transient ischemic attack (TIA), and cerebral infarction without residual deficits: Secondary | ICD-10-CM

## 2018-09-30 DIAGNOSIS — E1151 Type 2 diabetes mellitus with diabetic peripheral angiopathy without gangrene: Secondary | ICD-10-CM | POA: Diagnosis not present

## 2018-09-30 DIAGNOSIS — I340 Nonrheumatic mitral (valve) insufficiency: Secondary | ICD-10-CM | POA: Diagnosis not present

## 2018-09-30 DIAGNOSIS — I5031 Acute diastolic (congestive) heart failure: Secondary | ICD-10-CM | POA: Diagnosis present

## 2018-09-30 DIAGNOSIS — E039 Hypothyroidism, unspecified: Secondary | ICD-10-CM | POA: Diagnosis present

## 2018-09-30 DIAGNOSIS — R739 Hyperglycemia, unspecified: Secondary | ICD-10-CM

## 2018-09-30 DIAGNOSIS — Z794 Long term (current) use of insulin: Secondary | ICD-10-CM

## 2018-09-30 DIAGNOSIS — E1122 Type 2 diabetes mellitus with diabetic chronic kidney disease: Secondary | ICD-10-CM | POA: Diagnosis not present

## 2018-09-30 DIAGNOSIS — F329 Major depressive disorder, single episode, unspecified: Secondary | ICD-10-CM | POA: Diagnosis present

## 2018-09-30 DIAGNOSIS — I130 Hypertensive heart and chronic kidney disease with heart failure and stage 1 through stage 4 chronic kidney disease, or unspecified chronic kidney disease: Secondary | ICD-10-CM | POA: Diagnosis not present

## 2018-09-30 DIAGNOSIS — I160 Hypertensive urgency: Secondary | ICD-10-CM | POA: Diagnosis not present

## 2018-09-30 DIAGNOSIS — E875 Hyperkalemia: Secondary | ICD-10-CM | POA: Diagnosis not present

## 2018-09-30 DIAGNOSIS — K210 Gastro-esophageal reflux disease with esophagitis: Secondary | ICD-10-CM | POA: Diagnosis present

## 2018-09-30 DIAGNOSIS — R300 Dysuria: Secondary | ICD-10-CM

## 2018-09-30 DIAGNOSIS — Z90710 Acquired absence of both cervix and uterus: Secondary | ICD-10-CM

## 2018-09-30 DIAGNOSIS — R079 Chest pain, unspecified: Secondary | ICD-10-CM

## 2018-09-30 DIAGNOSIS — E785 Hyperlipidemia, unspecified: Secondary | ICD-10-CM | POA: Diagnosis not present

## 2018-09-30 DIAGNOSIS — Z8249 Family history of ischemic heart disease and other diseases of the circulatory system: Secondary | ICD-10-CM

## 2018-09-30 DIAGNOSIS — K222 Esophageal obstruction: Secondary | ICD-10-CM | POA: Diagnosis present

## 2018-09-30 DIAGNOSIS — I259 Chronic ischemic heart disease, unspecified: Secondary | ICD-10-CM | POA: Diagnosis not present

## 2018-09-30 DIAGNOSIS — Z7189 Other specified counseling: Secondary | ICD-10-CM | POA: Diagnosis not present

## 2018-09-30 DIAGNOSIS — Z515 Encounter for palliative care: Secondary | ICD-10-CM | POA: Diagnosis not present

## 2018-09-30 DIAGNOSIS — F411 Generalized anxiety disorder: Secondary | ICD-10-CM | POA: Diagnosis present

## 2018-09-30 DIAGNOSIS — R918 Other nonspecific abnormal finding of lung field: Secondary | ICD-10-CM | POA: Diagnosis not present

## 2018-09-30 DIAGNOSIS — I25118 Atherosclerotic heart disease of native coronary artery with other forms of angina pectoris: Secondary | ICD-10-CM | POA: Diagnosis not present

## 2018-09-30 DIAGNOSIS — K5750 Diverticulosis of both small and large intestine without perforation or abscess without bleeding: Secondary | ICD-10-CM | POA: Diagnosis present

## 2018-09-30 DIAGNOSIS — I252 Old myocardial infarction: Secondary | ICD-10-CM

## 2018-09-30 DIAGNOSIS — M542 Cervicalgia: Secondary | ICD-10-CM | POA: Diagnosis present

## 2018-09-30 DIAGNOSIS — Z8349 Family history of other endocrine, nutritional and metabolic diseases: Secondary | ICD-10-CM

## 2018-09-30 DIAGNOSIS — N184 Chronic kidney disease, stage 4 (severe): Secondary | ICD-10-CM | POA: Diagnosis present

## 2018-09-30 DIAGNOSIS — I480 Paroxysmal atrial fibrillation: Secondary | ICD-10-CM | POA: Diagnosis present

## 2018-09-30 DIAGNOSIS — E559 Vitamin D deficiency, unspecified: Secondary | ICD-10-CM | POA: Diagnosis present

## 2018-09-30 DIAGNOSIS — Z809 Family history of malignant neoplasm, unspecified: Secondary | ICD-10-CM

## 2018-09-30 DIAGNOSIS — Z79891 Long term (current) use of opiate analgesic: Secondary | ICD-10-CM

## 2018-09-30 DIAGNOSIS — G894 Chronic pain syndrome: Secondary | ICD-10-CM

## 2018-09-30 DIAGNOSIS — M545 Low back pain: Secondary | ICD-10-CM | POA: Diagnosis present

## 2018-09-30 DIAGNOSIS — E1165 Type 2 diabetes mellitus with hyperglycemia: Secondary | ICD-10-CM | POA: Diagnosis not present

## 2018-09-30 DIAGNOSIS — Z9181 History of falling: Secondary | ICD-10-CM

## 2018-09-30 DIAGNOSIS — Z79899 Other long term (current) drug therapy: Secondary | ICD-10-CM

## 2018-09-30 DIAGNOSIS — E538 Deficiency of other specified B group vitamins: Secondary | ICD-10-CM | POA: Diagnosis present

## 2018-09-30 DIAGNOSIS — R54 Age-related physical debility: Secondary | ICD-10-CM | POA: Diagnosis present

## 2018-09-30 DIAGNOSIS — E1143 Type 2 diabetes mellitus with diabetic autonomic (poly)neuropathy: Secondary | ICD-10-CM | POA: Diagnosis not present

## 2018-09-30 DIAGNOSIS — F1320 Sedative, hypnotic or anxiolytic dependence, uncomplicated: Secondary | ICD-10-CM | POA: Diagnosis present

## 2018-09-30 DIAGNOSIS — Z9049 Acquired absence of other specified parts of digestive tract: Secondary | ICD-10-CM

## 2018-09-30 DIAGNOSIS — R0989 Other specified symptoms and signs involving the circulatory and respiratory systems: Secondary | ICD-10-CM

## 2018-09-30 DIAGNOSIS — K5730 Diverticulosis of large intestine without perforation or abscess without bleeding: Secondary | ICD-10-CM | POA: Diagnosis not present

## 2018-09-30 DIAGNOSIS — Z7982 Long term (current) use of aspirin: Secondary | ICD-10-CM

## 2018-09-30 DIAGNOSIS — Z79890 Hormone replacement therapy (postmenopausal): Secondary | ICD-10-CM

## 2018-09-30 DIAGNOSIS — D649 Anemia, unspecified: Secondary | ICD-10-CM | POA: Diagnosis present

## 2018-09-30 DIAGNOSIS — H269 Unspecified cataract: Secondary | ICD-10-CM | POA: Diagnosis present

## 2018-09-30 DIAGNOSIS — I10 Essential (primary) hypertension: Secondary | ICD-10-CM | POA: Diagnosis not present

## 2018-09-30 DIAGNOSIS — G4733 Obstructive sleep apnea (adult) (pediatric): Secondary | ICD-10-CM | POA: Diagnosis present

## 2018-09-30 DIAGNOSIS — I2111 ST elevation (STEMI) myocardial infarction involving right coronary artery: Principal | ICD-10-CM

## 2018-09-30 LAB — CBC WITH DIFFERENTIAL/PLATELET
Abs Immature Granulocytes: 0.03 10*3/uL (ref 0.00–0.07)
BASOS ABS: 0.1 10*3/uL (ref 0.0–0.1)
Basophils Relative: 0 %
Eosinophils Absolute: 0.2 10*3/uL (ref 0.0–0.5)
Eosinophils Relative: 2 %
HCT: 41.8 % (ref 36.0–46.0)
Hemoglobin: 12.9 g/dL (ref 12.0–15.0)
IMMATURE GRANULOCYTES: 0 %
Lymphocytes Relative: 17 %
Lymphs Abs: 2 10*3/uL (ref 0.7–4.0)
MCH: 30.6 pg (ref 26.0–34.0)
MCHC: 30.9 g/dL (ref 30.0–36.0)
MCV: 99.1 fL (ref 80.0–100.0)
Monocytes Absolute: 0.9 10*3/uL (ref 0.1–1.0)
Monocytes Relative: 8 %
NRBC: 0 % (ref 0.0–0.2)
Neutro Abs: 8.3 10*3/uL — ABNORMAL HIGH (ref 1.7–7.7)
Neutrophils Relative %: 73 %
PLATELETS: 257 10*3/uL (ref 150–400)
RBC: 4.22 MIL/uL (ref 3.87–5.11)
RDW: 12.4 % (ref 11.5–15.5)
WBC: 11.5 10*3/uL — ABNORMAL HIGH (ref 4.0–10.5)

## 2018-09-30 LAB — COMPREHENSIVE METABOLIC PANEL WITH GFR
ALT: 24 U/L (ref 0–44)
AST: 26 U/L (ref 15–41)
Albumin: 4.3 g/dL (ref 3.5–5.0)
Alkaline Phosphatase: 131 U/L — ABNORMAL HIGH (ref 38–126)
Anion gap: 13 (ref 5–15)
BUN: 51 mg/dL — ABNORMAL HIGH (ref 8–23)
CO2: 23 mmol/L (ref 22–32)
Calcium: 8.2 mg/dL — ABNORMAL LOW (ref 8.9–10.3)
Chloride: 93 mmol/L — ABNORMAL LOW (ref 98–111)
Creatinine, Ser: 2.16 mg/dL — ABNORMAL HIGH (ref 0.44–1.00)
GFR calc Af Amer: 22 mL/min — ABNORMAL LOW (ref >60)
GFR calc non Af Amer: 19 mL/min — ABNORMAL LOW (ref >60)
Glucose, Bld: 328 mg/dL — ABNORMAL HIGH (ref 70–99)
Potassium: 5.4 mmol/L — ABNORMAL HIGH (ref 3.5–5.1)
Sodium: 129 mmol/L — ABNORMAL LOW (ref 135–145)
Total Bilirubin: 0.3 mg/dL (ref 0.3–1.2)
Total Protein: 7.3 g/dL (ref 6.5–8.1)

## 2018-09-30 LAB — LIPASE, BLOOD: LIPASE: 20 U/L (ref 11–51)

## 2018-09-30 LAB — PROTIME-INR
INR: 1 (ref 0.8–1.2)
Prothrombin Time: 13.3 s (ref 11.4–15.2)

## 2018-09-30 LAB — TROPONIN I: Troponin I: 0.06 ng/mL (ref <0.03)

## 2018-09-30 MED ORDER — SODIUM CHLORIDE 0.9 % IV SOLN
500.0000 mg | Freq: Once | INTRAVENOUS | Status: AC
  Administered 2018-10-01: 500 mg via INTRAVENOUS
  Filled 2018-09-30: qty 500

## 2018-09-30 MED ORDER — MORPHINE SULFATE (PF) 2 MG/ML IV SOLN
2.0000 mg | Freq: Once | INTRAVENOUS | Status: AC
  Administered 2018-09-30: 2 mg via INTRAVENOUS
  Filled 2018-09-30: qty 1

## 2018-09-30 MED ORDER — SODIUM CHLORIDE 0.9 % IV BOLUS
500.0000 mL | Freq: Once | INTRAVENOUS | Status: AC
  Administered 2018-09-30: 500 mL via INTRAVENOUS

## 2018-09-30 MED ORDER — FENTANYL CITRATE (PF) 100 MCG/2ML IJ SOLN
25.0000 ug | Freq: Once | INTRAMUSCULAR | Status: AC
  Administered 2018-09-30: 25 ug via INTRAVENOUS
  Filled 2018-09-30: qty 2

## 2018-09-30 MED ORDER — SODIUM CHLORIDE 0.9 % IV SOLN
1.0000 g | Freq: Once | INTRAVENOUS | Status: AC
  Administered 2018-09-30: 1 g via INTRAVENOUS
  Filled 2018-09-30: qty 10

## 2018-09-30 MED ORDER — NITROGLYCERIN 0.4 MG SL SUBL
0.4000 mg | SUBLINGUAL_TABLET | SUBLINGUAL | Status: DC | PRN
  Administered 2018-09-30 – 2018-10-01 (×3): 0.4 mg via SUBLINGUAL
  Filled 2018-09-30: qty 1

## 2018-09-30 NOTE — ED Triage Notes (Signed)
Patient c/o since yesterday @ 05:00a.m. Has taken multiple nitro's with intermittent relief. Also c/o SOB and diaphoresis.

## 2018-09-30 NOTE — ED Notes (Signed)
Critical Trop .06, MD notified

## 2018-09-30 NOTE — ED Provider Notes (Signed)
[facility] EMERGENCY DEPARTMENT Provider Note   CSN: [id number] Arrival date & time: 09/30/18  1859    History   Chief Complaint Chief Complaint  Patient presents with  . Chest Pain    HPI Karen [name] is a 85 y.o. female.     HPI  85 yo female with presents with complaints of chest pain that began 36 hours pte.  She reports sscp awoke her from sleep yesterday with associated dyspnea and cough.  Cough is nonproductive.  She has taken nitro intermittently with some relief. Patient lives independently and walks with a cane. Additional ho from son and daughter in law.   Past Medical History:  Diagnosis Date  . A-fib ([place])   . Abnormal involuntary movements(781.0)   . Anemia   . Anxiety   . Anxiety state, unspecified   . Arthritis    body, neck  . Benign hypertensive heart disease   . Benign hypertensive heart disease with congestive heart failure ([place])   . Bladder disorder    over active  . Cancer ([place])    skin, removed  . Cervicalgia   . CHF (congestive heart failure) ([place])   . Chronic kidney disease, stage III (moderate) (HCC)   . Congestive heart failure, unspecified   . Contact dermatitis and other eczema, due to unspecified cause   . Corns and callosities   . Diabetes mellitus without complication ([place])   . Diarrhea   . Dyspnea 02/18/2015  . Edema   . Esophageal stricture   . Gastroparesis   . Gastroparesis   . GERD (gastroesophageal reflux disease)   . Hiatal hernia   . Hyperlipidemia   . Inflamed seborrheic keratosis   . Inflammatory disease of breast   . Irregular heartbeat   . Lumbago   . Mixed incontinence urge and stress (female)(female)   . Nausea alone   . Nonspecific (abnormal) findings on radiological and other examination of skull and head   . Osteoarthrosis, unspecified whether generalized or localized, unspecified site   . Other and unspecified hyperlipidemia   . Other B-complex deficiencies   . Other functional  disorder of bladder   . Other malaise and fatigue   . Other specified visual disturbances   . Pain in joint, lower leg   . Palpitations   . Peripheral vascular disease, unspecified ([place])   . Postmenopausal atrophic vaginitis   . Reflux esophagitis   . Seborrheic keratosis   . Sleep apnea   . Spasm of muscle   . Spinal stenosis   . Spinal stenosis, unspecified region other than cervical   . Stricture and stenosis of esophagus   . Stroke ([place])   . TIA (transient ischemic attack) 2012  . Transient ischemic attack (TIA), and cerebral infarction without residual deficits(V12.54)   . Type II or unspecified type diabetes mellitus with renal manifestations, not stated as uncontrolled(250.40)   . Unspecified constipation   . Unspecified essential hypertension   . Unspecified hereditary and idiopathic peripheral neuropathy   . Unspecified hypothyroidism   . Unspecified pruritic disorder   . Unspecified sleep apnea   . Unspecified vitamin D deficiency   . Urinary tract infection, site not specified   . Vitamin B12 deficiency     Patient Active Problem List   Diagnosis Date Noted  . Hyperkalemia 08/03/2018  . Chronic pain syndrome 04/14/2018  . Neuropathy due to secondary diabetes ([place]) 03/22/2018  . History of TIA (transient ischemic attack) 03/22/2018  . Paroxysmal A-fib (  [place]) 04/03/2017  . Bilateral primary osteoarthritis of knee 04/03/2017  . Venous insufficiency of both lower extremities 03/06/2017  . Poorly controlled type 2 diabetes mellitus with autonomic neuropathy ([place]) 03/06/2017  . Hyperlipidemia   . Tachycardia 12/24/2014  . Obstructive sleep apnea 05/20/2014  . Chronic kidney disease, stage III (moderate) ([place]) 05/15/2013  . Osteoarthritis of both knees 05/15/2013  . Tremor 05/15/2013  . Varicose veins of lower extremities with other complications 37/62/8315  . Essential hypertension 12/17/2012  . Pruritic disorder 12/17/2012  . Spinal stenosis of lumbar region  12/17/2012  . Acid reflux   . GAD (generalized anxiety disorder)   . Hypothyroidism   . Dysphagia 10/09/2012    Past Surgical History:  Procedure Laterality Date  . ABDOMINAL HYSTERECTOMY    . APPENDECTOMY    . BACK SURGERY     x 2  . CESAREAN SECTION    . CHOLECYSTECTOMY  1991  . esophageal  2004,2006   dilation, Dr Lyla Son  . [place]   bilateral cataract  . hammer toes    . HERNIA REPAIR  1984aaaaaaaa  . SKIN CANCER DESTRUCTION    . THYROIDECTOMY, PARTIAL  1965     OB History   No obstetric history on file.      Home Medications    Prior to Admission medications   Medication Sig Start Date End Date Taking? Authorizing Provider  ALPRAZolam (XANAX) 1 MG tablet TAKE ONE-HALF TABLET BY MOUTH THREE TIMES A DAY AND TAKE ONE TABLET AT BEDTIME 09/13/18   Bedsole, Amy E, MD  aspirin 325 MG EC tablet Take 325 mg by mouth daily.    [provider]  CRANBERRY PO Take 300 mg by mouth daily.    [provider]  [facility] LUTEIN [facility] PO Take 1 tablet by mouth daily.     [provider]  diclofenac sodium (VOLTAREN) 1 % GEL Apply 4 g topically 4 (four) times daily. To bilateral knees 03/27/17   Reed, Tiffany L, DO  digoxin (LANOXIN) 0.125 MG tablet TAKE ONE TABLET BY MOUTH ON MONDAY, WEDNESDAY, FRIDAY, AND SATURDAY. Patient taking differently: TAKE ONE TABLET BY MOUTH ON MONDAY, WEDNESDAY, FRIDAY, AND SATURDAY. 04/16/18   Gildardo Cranker, DO  furosemide (LASIX) 40 MG tablet TAKE 1 TABLET BY MOUTH DAILY TO CONTROL EDEMA 07/09/18   Bedsole, Amy E, MD  gabapentin (NEURONTIN) 100 MG capsule TAKE TWO CAPSULES BY MOUTH AT BEDTIME TO HELP WITH TREMORS 07/17/18   Reed, Tiffany L, DO  glucagon (GLUCAGON EMERGENCY) 1 MG injection Inject 1 mg into the muscle once as needed for up to 1 dose. 08/21/18   Philemon Kingdom, MD  HYDROcodone-acetaminophen (NORCO) 7.5-325 MG tablet Take 1 tablet by mouth 4 (four) times daily. 08/16/18   Bedsole, Amy E, MD    HYDROcodone-acetaminophen (NORCO) 7.5-325 MG tablet Take 1 tablet by mouth 4 (four) times daily. DO NOT FILL UNTIL 09/19/2018 08/16/18   Bedsole, Amy E, MD  HYDROcodone-acetaminophen (NORCO) 7.5-325 MG tablet Take 1 tablet by mouth 4 (four) times daily. 08/16/18   Bedsole, Amy E, MD  insulin lispro (HUMALOG) 100 UNIT/ML KwikPen Inject under skin up to 50 units a day as advised. Dx:E11.29, Normal 08/23/18   Philemon Kingdom, MD  Insulin Pen Needle (B-D ULTRAFINE III SHORT PEN) 31G X 8 MM MISC Use with administering insulin. Dx. E11.29 08/06/18   Bedsole, Amy E, MD  LANTUS SOLOSTAR 100 UNIT/ML Solostar Pen INJECT 42 UNITS INTO THE SKIN AT  BEDTIME Patient taking differently: Inject 42 Units into the skin at bedtime.  01/01/18   Gildardo Cranker, DO  levothyroxine (SYNTHROID, LEVOTHROID) 125 MCG tablet TAKE ONE TABLET BY MOUTH EVERY MORNING 30 MINUTES BEFORE BREAKFAST 07/12/18   Reed, Tiffany L, DO  metoprolol tartrate (LOPRESSOR) 50 MG tablet TAKE 1 TABLET BY MOUTH TWICE A DAY TO CONTROL HEART RATE Patient taking differently: Take 50 mg by mouth 2 (two) times daily.  05/22/18   Bedsole, Amy E, MD  Multiple Vitamins-Minerals (PRESERVISION AREDS 2 PO) Take 1 tablet by mouth 2 (two) times daily.     [provider]  nitroGLYCERIN (NITROSTAT) 0.4 MG SL tablet Place 1 tablet (0.4 mg total) under the tongue every 5 (five) minutes as needed for chest pain. 09/20/16   Estill Dooms, MD  omeprazole (PRILOSEC) 40 MG capsule TAKE ONE (1) CAPSULE BY MOUTH 2 TIMES DAILY FOR STOMACH 09/07/18   Bedsole, Amy E, MD  ondansetron (ZOFRAN) 4 MG tablet TAKE ONE TABLET BY MOUTH EVERY 4 HOURS AS NEEDED FOR NAUSEA/VOMITING Patient taking differently: Take 4 mg by mouth every 8 (eight) hours as needed.  04/19/17   Reed, Tiffany L, DO  ONE TOUCH ULTRA TEST test strip Use to test sugar three times daily dx E11.29 08/16/18   Jinny Sanders, MD  Polyethyl Glycol-Propyl Glycol (SYSTANE OP) Place 1 drop into both eyes 2 (two) times  daily.     [provider]  triamcinolone cream (KENALOG) 0.1 % Apply 1 application topically as needed. Mix with CeraVe cream apply twice daily for dry skin 07/21/17   [provider]  trimethoprim (TRIMPEX) 100 MG tablet TAKE ONE TABLET BY MOUTH EVERY NIGHT AT BEDTIME TO PREVENT BLADDER INFECTION. Patient taking differently: Take 100 mg by mouth at bedtime.  04/05/18   Bedsole, Amy E, MD  Vitamin D, Ergocalciferol, (DRISDOL) 50000 units CAPS capsule TAKE ONE CAPSULE BY MOUTH ON THE SAME DAY EVERY WEEK AS DIRECTED Patient taking differently: Take 50,000 Units by mouth every 7 (seven) days.  03/19/18   Gildardo Cranker, DO  VITAMIN E PO Take 400 Units by mouth daily.    [provider]    Family History Family History  Problem Relation Age of Onset  . Cancer Mother        ? stomach  . Heart disease Father   . Hyperlipidemia Father   . Hypertension Father   . Heart attack Father   . Heart disease Brother   . Kidney disease Daughter   . Heart disease Brother   . Cancer Son     Social History Social History   Tobacco Use  . Smoking status: Never Smoker  . Smokeless tobacco: Never Used  Substance Use Topics  . Alcohol use: No  . Drug use: No     Allergies   Celebrex [celecoxib]; Cymbalta [duloxetine hcl]; Doxycycline; Lyrica [pregabalin]; Avandia [rosiglitazone]; Macrodantin [nitrofurantoin macrocrystal]; Sulfa antibiotics; Vioxx [rofecoxib]; and Silicone   Review of Systems Review of Systems  All other systems reviewed and are negative.    Physical Exam Updated Vital Signs BP (!) 163/49   Pulse 63   Temp 97.9 F (36.6 C) (Oral)   Resp (!) 24   Wt 61.7 kg   SpO2 92%   BMI 24.09 kg/m   Physical Exam Vitals signs and nursing note reviewed.  HENT:     Head: Normocephalic.  Eyes:     Pupils: Pupils are equal, round, and reactive to light.  Neck:  Musculoskeletal: Normal range of motion.  Cardiovascular:     Rate and Rhythm: Normal  rate.     Heart sounds: Normal heart sounds.  Pulmonary:     Effort: Pulmonary effort is normal.     Breath sounds: Normal breath sounds.  Abdominal:     General: Bowel sounds are normal.     Palpations: Abdomen is soft.  Musculoskeletal: Normal range of motion.  Skin:    General: Skin is warm.     Capillary Refill: Capillary refill takes less than 2 seconds.  Neurological:     General: No focal deficit present.     Mental Status: She is alert.      ED Treatments / Results  Labs (all labs ordered are listed, but only abnormal results are displayed) Labs Reviewed  CBC WITH DIFFERENTIAL/PLATELET - Abnormal; Notable for the following components:      Result Value   WBC 11.5 (*)    Neutro Abs 8.3 (*)    All other components within normal limits  COMPREHENSIVE METABOLIC PANEL - Abnormal; Notable for the following components:   Sodium 129 (*)    Potassium 5.4 (*)    Chloride 93 (*)    Glucose, Bld 328 (*)    BUN 51 (*)    Creatinine, Ser 2.16 (*)    Calcium 8.2 (*)    Alkaline Phosphatase 131 (*)    GFR calc non Af Amer 19 (*)    GFR calc Af Amer 22 (*)    All other components within normal limits  TROPONIN I - Abnormal; Notable for the following components:   Troponin I 0.06 (*)    All other components within normal limits  CULTURE, BLOOD (ROUTINE X 2)  CULTURE, BLOOD (ROUTINE X 2)  LIPASE, BLOOD  PROTIME-INR    EKG EKG Interpretation  Date/Time:  Sunday September 30 2018 19:11:22 EST Ventricular Rate:  63 PR Interval:    QRS Duration: 108 QT Interval:  385 QTC Calculation: 395 R Axis:   31 Text Interpretation:  Sinus rhythm Anterior infarct, old Confirmed by Pattricia Boss [id number]) on 09/30/2018 7:31:58 PM   Radiology Ct Abdomen Pelvis Wo Contrast  Result Date: 09/30/2018 CLINICAL DATA:  Chest pain since yesterday. Intermittent relief with nitroglycerin. Shortness of breath and diaphoresis. EXAM: CT CHEST, ABDOMEN AND PELVIS WITHOUT CONTRAST TECHNIQUE:  Multidetector CT imaging of the chest, abdomen and pelvis was performed following the standard protocol without IV contrast. COMPARISON:  Chest x-Kemontae Dunklee September 30, 2018 FINDINGS: CT CHEST FINDINGS Cardiovascular: The thoracic aorta demonstrates atherosclerotic change without aneurysm. The fat around the thoracic aorta is normal. No evidence of leak. Dissection can not be assessed without contrast. The central pulmonary arteries are normal in caliber. Coronary artery calcifications involve the LAD and circumflex artery primarily. The heart is unremarkable. Mediastinum/Nodes: The patient has a pectus deformity. No adenopathy. No effusion. Lungs/Pleura: Central airways are normal. No pneumothorax. Scattered nodularity in the lungs, right greater than left, particularly in the right middle lobe and posteriorly in the right lower lobe. A representative nodule in the right middle lobe measures 7.5 mm. No myalgias is. No other infiltrates. Musculoskeletal: See below. CT ABDOMEN PELVIS FINDINGS Hepatobiliary: No focal liver abnormality is seen. Status post cholecystectomy. No biliary dilatation. Pancreas: Unremarkable. No pancreatic ductal dilatation or surrounding inflammatory changes. Spleen: Normal in size without focal abnormality. Adrenals/Urinary Tract: Adrenal glands are unremarkable. Kidneys are normal, without renal calculi, focal lesion, or hydronephrosis. Bladder is unremarkable. Stomach/Bowel: A duodenal diverticulum is identified without  evidence of inflammation. The stomach and small bowel are otherwise normal. Colonic diverticulosis is identified without convincing evidence of diverticulitis. The remainder of the colon is unremarkable. Status post appendectomy. Vascular/Lymphatic: Dense atherosclerotic change in the nonaneurysmal abdominal aorta. Dense atherosclerotic change at the origin of the left renal artery. No adenopathy. Reproductive: The patient may be status post hysterectomy. Recommend clinical  correlation. Other: No abdominal wall hernia or abnormality. No abdominopelvic ascites. Musculoskeletal: Degenerative changes in the hips. Degenerative changes throughout the spine. Pedicle rods and screws seen at L3 and L4, in good position. No acute spinal fracture noted. IMPRESSION: 1. Nodularity in the right lower lobe and right middle lobe primarily with a few nodules on the left. I suspect an infectious process such as MAI. The findings however are not definitive. A representative nodule in the right middle lobe measures 8 mm. Non-contrast chest CT at 3-6 months is recommended. If the nodules are stable at time of repeat CT, then future CT at 18-24 months (from today's scan) is considered optional for low-risk patients, but is recommended for high-risk patients. This recommendation follows the consensus statement: Guidelines for Management of Incidental Pulmonary Nodules Detected on CT Images: From the Fleischner Society 2017; Radiology 2017; 284:228-243. 2. Degenerative changes in the spine. No other cause for the patient's symptoms identified. 3. Atherosclerotic changes in the thoracic and abdominal aorta without aneurysm. 4. Coronary artery calcifications. Electronically Signed   By: Dorise Bullion III M.D   On: 09/30/2018 22:32   Ct Chest Wo Contrast  Result Date: 09/30/2018 CLINICAL DATA:  Chest pain since yesterday. Intermittent relief with nitroglycerin. Shortness of breath and diaphoresis. EXAM: CT CHEST, ABDOMEN AND PELVIS WITHOUT CONTRAST TECHNIQUE: Multidetector CT imaging of the chest, abdomen and pelvis was performed following the standard protocol without IV contrast. COMPARISON:  Chest x-Ferlando Lia September 30, 2018 FINDINGS: CT CHEST FINDINGS Cardiovascular: The thoracic aorta demonstrates atherosclerotic change without aneurysm. The fat around the thoracic aorta is normal. No evidence of leak. Dissection can not be assessed without contrast. The central pulmonary arteries are normal in caliber.  Coronary artery calcifications involve the LAD and circumflex artery primarily. The heart is unremarkable. Mediastinum/Nodes: The patient has a pectus deformity. No adenopathy. No effusion. Lungs/Pleura: Central airways are normal. No pneumothorax. Scattered nodularity in the lungs, right greater than left, particularly in the right middle lobe and posteriorly in the right lower lobe. A representative nodule in the right middle lobe measures 7.5 mm. No myalgias is. No other infiltrates. Musculoskeletal: See below. CT ABDOMEN PELVIS FINDINGS Hepatobiliary: No focal liver abnormality is seen. Status post cholecystectomy. No biliary dilatation. Pancreas: Unremarkable. No pancreatic ductal dilatation or surrounding inflammatory changes. Spleen: Normal in size without focal abnormality. Adrenals/Urinary Tract: Adrenal glands are unremarkable. Kidneys are normal, without renal calculi, focal lesion, or hydronephrosis. Bladder is unremarkable. Stomach/Bowel: A duodenal diverticulum is identified without evidence of inflammation. The stomach and small bowel are otherwise normal. Colonic diverticulosis is identified without convincing evidence of diverticulitis. The remainder of the colon is unremarkable. Status post appendectomy. Vascular/Lymphatic: Dense atherosclerotic change in the nonaneurysmal abdominal aorta. Dense atherosclerotic change at the origin of the left renal artery. No adenopathy. Reproductive: The patient may be status post hysterectomy. Recommend clinical correlation. Other: No abdominal wall hernia or abnormality. No abdominopelvic ascites. Musculoskeletal: Degenerative changes in the hips. Degenerative changes throughout the spine. Pedicle rods and screws seen at L3 and L4, in good position. No acute spinal fracture noted. IMPRESSION: 1. Nodularity in the right lower lobe and  right middle lobe primarily with a few nodules on the left. I suspect an infectious process such as MAI. The findings however are  not definitive. A representative nodule in the right middle lobe measures 8 mm. Non-contrast chest CT at 3-6 months is recommended. If the nodules are stable at time of repeat CT, then future CT at 18-24 months (from today's scan) is considered optional for low-risk patients, but is recommended for high-risk patients. This recommendation follows the consensus statement: Guidelines for Management of Incidental Pulmonary Nodules Detected on CT Images: From the Fleischner Society 2017; Radiology 2017; 284:228-243. 2. Degenerative changes in the spine. No other cause for the patient's symptoms identified. 3. Atherosclerotic changes in the thoracic and abdominal aorta without aneurysm. 4. Coronary artery calcifications. Electronically Signed   By: Dorise Bullion III M.D   On: 09/30/2018 22:32   Dg Chest Port 1 View  Result Date: 09/30/2018 CLINICAL DATA:  Chest pain since yesterday. EXAM: PORTABLE CHEST 1 VIEW COMPARISON:  June 26, 2017 FINDINGS: The heart size and mediastinal contours are within normal limits. Both lungs are clear. The visualized skeletal structures are unremarkable. IMPRESSION: No active disease. Electronically Signed   By: Dorise Bullion III M.D   On: 09/30/2018 20:22    Procedures .Critical Care Performed by: Pattricia Boss, MD Authorized by: Pattricia Boss, MD   Critical care provider statement:    Critical care time (minutes):  45   Critical care end time:  10/01/2018 12:16 AM   Critical care was time spent personally by me on the following activities:  Discussions with consultants, evaluation of patient's response to treatment, examination of patient, ordering and performing treatments and interventions, ordering and review of laboratory studies, ordering and review of radiographic studies, pulse oximetry, re-evaluation of patient's condition, obtaining history from patient or surrogate and review of old charts   (including critical care time)  Medications Ordered in  ED Medications  nitroGLYCERIN (NITROSTAT) SL tablet 0.4 mg (has no administration in time range)  sodium chloride 0.9 % bolus 500 mL (has no administration in time range)  fentaNYL (SUBLIMAZE) injection 25 mcg (25 mcg Intravenous Given 09/30/18 2047)     Initial Impression / Assessment and Plan / ED Course  I have reviewed the triage vital signs and the nursing notes.  Pertinent labs & imaging results that were available during my care of the patient were reviewed by me and considered in my medical decision making (see chart for details).      85 yo female presents today with 36 hours of chest pain 1-chest pain- no acute changes on ekg Initial trop elevated at 0.06 Sl nitro being given Cardiology consult being obtained 2- ct chest with nodule rll and rml concerning for mai- being treated here wit rocephin and zithromax-will need id consult No ho hiv or exposures to mai 3- hyperglycemia- patient did not take evening insulin and does not appear in dka.  Iv fluids being given 4- stable esrd 5--hyperkalemia- iv fluids ensuing on monitor, will need to recheck after fluids  Discussed with DR. Rathore and will see for admission Discussed with DR. Juliane Lack and will see for cardiology consult      Final Clinical Impressions(s) / ED Diagnoses   Final diagnoses:  Chest pain, unspecified type    ED Discharge Orders    None       Pattricia Boss, MD 10/01/18 [phone]

## 2018-09-30 NOTE — ED Notes (Signed)
Assisted pt to bathroom with use of wheelchair and one person assist. Pt has very unsteady gait.

## 2018-09-30 NOTE — Consult Note (Signed)
Referring Physician:  Primary Physician: Primary Cardiologist: Reason for Consultation: Chest pain with marginal trop elevation.   HPI: 85 yo female with presents with complaints of chest pain that began 36 hours..  She reports that chest pain woke her from sleep yesterday.  She has taken nitro intermittently with some relief. Patient lives independently and walks with a cane. Patient has history including, but not limited to, HTN/ HLD; CKD- stage III; OSA; GERD; recent falls; DM- type II with neuropathy; pA-Fib; previous TIA/ CVA; and anxiety. She has h/o esophageal stricture s/p dilation and hiatal hernia on PPI. States that pain in retrosternal radiating to the Rt arm and abdomen. The fentanyl relieved the pain but mild relief  From NTG as well. She has son who is POA and is not keen for invasive treatment. Her EKG did not show acute changes and Trop was marginally elevated.  Review of Systems:     Cardiac Review of Systems: {Y] = yes [ ]  = no  Chest Pain [ Y   ]  Resting SOB [   ] Exertional SOB  [  ]  Orthopnea [  ]   Pedal Edema [   ]    Palpitations [  ] Syncope  [  ]   Presyncope [   ]  General Review of Systems: [Y] = yes [  ]=no Constitional: recent weight change [  ]; anorexia [  ]; fatigue [ Y ]; nausea [  ]; night sweats [  ]; fever [  ]; or chills [  ];                                                                      Eyes : blurred vision [  ]; diplopia [   ]; vision changes [  ];  Amaurosis fugax[  ]; Resp: cough [  ];  wheezing[  ];  hemoptysis[  ];  PND [  ];  GI:  gallstones[  ], vomiting[  ];  dysphagia[  ]; melena[  ];  hematochezia [  ]; heartburn[Y  ];   GU: kidney stones [  ]; hematuria[  ];   dysuria [  ];  nocturia[  ]; incontinence [  ];             Skin: rash, swelling[  ];, hair loss[  ];  peripheral edema[  ];  or itching[  ]; Musculosketetal: myalgias[  ];  joint swelling[  ];  joint erythema[  ];  joint pain[  ];  back pain[  ];  Heme/Lymph: bruising[   ];  bleeding[  ];  anemia[  ];  Neuro: TIA[  ];  headaches[  ];  stroke[  ];  vertigo[  ];  seizures[  ];   paresthesias[  ];  difficulty walking[  ];  Psych:depression[  ]; anxiety[  ];  Endocrine: diabetes[  ];  thyroid dysfunction[  ];  Other:  Past Medical History:  Diagnosis Date  . A-fib ([place])   . Abnormal involuntary movements(781.0)   . Anemia   . Anxiety   . Anxiety state, unspecified   . Arthritis    body, neck  . Benign hypertensive heart disease   . Benign hypertensive heart disease with  congestive heart failure ([place])   . Bladder disorder    over active  . Cancer ([place])    skin, removed  . Cervicalgia   . CHF (congestive heart failure) ([place])   . Chronic kidney disease, stage III (moderate) (HCC)   . Congestive heart failure, unspecified   . Contact dermatitis and other eczema, due to unspecified cause   . Corns and callosities   . Diabetes mellitus without complication ([place])   . Diarrhea   . Dyspnea 02/18/2015  . Edema   . Esophageal stricture   . Gastroparesis   . Gastroparesis   . GERD (gastroesophageal reflux disease)   . Hiatal hernia   . Hyperlipidemia   . Inflamed seborrheic keratosis   . Inflammatory disease of breast   . Irregular heartbeat   . Lumbago   . Mixed incontinence urge and stress (female)(female)   . Nausea alone   . Nonspecific (abnormal) findings on radiological and other examination of skull and head   . Osteoarthrosis, unspecified whether generalized or localized, unspecified site   . Other and unspecified hyperlipidemia   . Other B-complex deficiencies   . Other functional disorder of bladder   . Other malaise and fatigue   . Other specified visual disturbances   . Pain in joint, lower leg   . Palpitations   . Peripheral vascular disease, unspecified ([place])   . Postmenopausal atrophic vaginitis   . Reflux esophagitis   . Seborrheic keratosis   . Sleep apnea   . Spasm of muscle   . Spinal stenosis   . Spinal stenosis,  unspecified region other than cervical   . Stricture and stenosis of esophagus   . Stroke ([place])   . TIA (transient ischemic attack) 2012  . Transient ischemic attack (TIA), and cerebral infarction without residual deficits(V12.54)   . Type II or unspecified type diabetes mellitus with renal manifestations, not stated as uncontrolled(250.40)   . Unspecified constipation   . Unspecified essential hypertension   . Unspecified hereditary and idiopathic peripheral neuropathy   . Unspecified hypothyroidism   . Unspecified pruritic disorder   . Unspecified sleep apnea   . Unspecified vitamin D deficiency   . Urinary tract infection, site not specified   . Vitamin B12 deficiency     (Not in a hospital admission)    .  morphine injection  2 mg Intravenous Once    Infusions:   Allergies  Allergen Reactions  . Celebrex [Celecoxib] Rash  . Cymbalta [Duloxetine Hcl] Rash    Lip numbness,rash, leg and body jerks. Patient states " I though I was having a heart attack or stroke."   . Doxycycline Rash  . Lyrica [Pregabalin] Rash  . Avandia [Rosiglitazone]   . Macrodantin [Nitrofurantoin Macrocrystal]   . Sulfa Antibiotics   . Vioxx [Rofecoxib]   . Silicone Rash    Gets rash from the touch it.    Social History   Socioeconomic History  . Marital status: Widowed    Spouse name: Not on file  . Number of children: 3  . Years of education: Not on file  . Highest education level: Not on file  Occupational History  . Occupation: retired  Scientific laboratory technician  . Financial resource strain: Not hard at all  . Food insecurity:    Worry: Never true    Inability: Never true  . Transportation needs:    Medical: No    Non-medical: No  Tobacco Use  . Smoking status: Never Smoker  . Smokeless  tobacco: Never Used  Substance and Sexual Activity  . Alcohol use: No  . Drug use: No  . Sexual activity: Never  Lifestyle  . Physical activity:    Days per week: 0 days    Minutes per session: 0 min   . Stress: Rather much  Relationships  . Social connections:    Talks on phone: More than three times a week    Gets together: More than three times a week    Attends religious service: More than 4 times per year    Active member of club or organization: Yes    Attends meetings of clubs or organizations: Never    Relationship status: Widowed  . Intimate partner violence:    Fear of current or ex partner: No    Emotionally abused: No    Physically abused: No    Forced sexual activity: No  Other Topics Concern  . Not on file  Social History Narrative  . Not on file    Family History  Problem Relation Age of Onset  . Cancer Mother        ? stomach  . Heart disease Father   . Hyperlipidemia Father   . Hypertension Father   . Heart attack Father   . Heart disease Brother   . Kidney disease Daughter   . Heart disease Brother   . Cancer Son     PHYSICAL EXAM: Vitals:   09/30/18 2130 09/30/18 2300  BP: (!) 164/48 (!) 163/49  Pulse: 62 63  Resp: 15 (!) 24  Temp:    SpO2: 96% 92%    No intake or output data in the 24 hours ending 09/30/18 2337  General:  Well appearing. No respiratory difficulty HEENT: normal Neck: supple. no JVD. Carotids 2+ bilat; no bruits. No lymphadenopathy or thryomegaly appreciated. Cor: PMI nondisplaced. Regular rate & rhythm. No rubs, gallops or murmurs. TENDERNESS ON THE rT cc AREA. Lungs: clear Abdomen: soft, with EPIGASTRIC TENDERNESS,nondistended. No hepatosplenomegaly. No bruits or masses. Good bowel sounds. Extremities: no cyanosis, clubbing, rash, edema Neuro: alert & oriented x 3, cranial nerves grossly intact. moves all 4 extremities w/o difficulty. Affect pleasant.  ECG: Sinus with old AWMI.  Results for orders placed or performed during the hospital encounter of 09/30/18 (from the past 24 hour(s))  CBC with Differential/Platelet     Status: Abnormal   Collection Time: 09/30/18  7:28 PM  Result Value Ref Range   WBC 11.5 (H) 4.0  - 10.5 K/uL   RBC 4.22 3.87 - 5.11 MIL/uL   Hemoglobin 12.9 12.0 - 15.0 g/dL   HCT 41.8 36.0 - 46.0 %   MCV 99.1 80.0 - 100.0 fL   MCH 30.6 26.0 - 34.0 pg   MCHC 30.9 30.0 - 36.0 g/dL   RDW 12.4 11.5 - 15.5 %   Platelets 257 150 - 400 K/uL   nRBC 0.0 0.0 - 0.2 %   Neutrophils Relative % 73 %   Neutro Abs 8.3 (H) 1.7 - 7.7 K/uL   Lymphocytes Relative 17 %   Lymphs Abs 2.0 0.7 - 4.0 K/uL   Monocytes Relative 8 %   Monocytes Absolute 0.9 0.1 - 1.0 K/uL   Eosinophils Relative 2 %   Eosinophils Absolute 0.2 0.0 - 0.5 K/uL   Basophils Relative 0 %   Basophils Absolute 0.1 0.0 - 0.1 K/uL   Immature Granulocytes 0 %   Abs Immature Granulocytes 0.03 0.00 - 0.07 K/uL  Comprehensive metabolic panel  Status: Abnormal   Collection Time: 09/30/18  7:28 PM  Result Value Ref Range   Sodium 129 (L) 135 - 145 mmol/L   Potassium 5.4 (H) 3.5 - 5.1 mmol/L   Chloride 93 (L) 98 - 111 mmol/L   CO2 23 22 - 32 mmol/L   Glucose, Bld 328 (H) 70 - 99 mg/dL   BUN 51 (H) 8 - 23 mg/dL   Creatinine, Ser 2.16 (H) 0.44 - 1.00 mg/dL   Calcium 8.2 (L) 8.9 - 10.3 mg/dL   Total Protein 7.3 6.5 - 8.1 g/dL   Albumin 4.3 3.5 - 5.0 g/dL   AST 26 15 - 41 U/L   ALT 24 0 - 44 U/L   Alkaline Phosphatase 131 (H) 38 - 126 U/L   Total Bilirubin 0.3 0.3 - 1.2 mg/dL   GFR calc non Af Amer 19 (L) >60 mL/min   GFR calc Af Amer 22 (L) >60 mL/min   Anion gap 13 5 - 15  Lipase, blood     Status: None   Collection Time: 09/30/18  7:28 PM  Result Value Ref Range   Lipase 20 11 - 51 U/L  Protime-INR     Status: None   Collection Time: 09/30/18  7:28 PM  Result Value Ref Range   Prothrombin Time 13.3 11.4 - 15.2 seconds   INR 1.0 0.8 - 1.2  Troponin I - ONCE - STAT     Status: Abnormal   Collection Time: 09/30/18  7:28 PM  Result Value Ref Range   Troponin I 0.06 (HH) <0.03 ng/mL   Ct Abdomen Pelvis Wo Contrast  Result Date: 09/30/2018 CLINICAL DATA:  Chest pain since yesterday. Intermittent relief with  nitroglycerin. Shortness of breath and diaphoresis. EXAM: CT CHEST, ABDOMEN AND PELVIS WITHOUT CONTRAST TECHNIQUE: Multidetector CT imaging of the chest, abdomen and pelvis was performed following the standard protocol without IV contrast. COMPARISON:  Chest x-ray September 30, 2018 FINDINGS: CT CHEST FINDINGS Cardiovascular: The thoracic aorta demonstrates atherosclerotic change without aneurysm. The fat around the thoracic aorta is normal. No evidence of leak. Dissection can not be assessed without contrast. The central pulmonary arteries are normal in caliber. Coronary artery calcifications involve the LAD and circumflex artery primarily. The heart is unremarkable. Mediastinum/Nodes: The patient has a pectus deformity. No adenopathy. No effusion. Lungs/Pleura: Central airways are normal. No pneumothorax. Scattered nodularity in the lungs, right greater than left, particularly in the right middle lobe and posteriorly in the right lower lobe. A representative nodule in the right middle lobe measures 7.5 mm. No myalgias is. No other infiltrates. Musculoskeletal: See below. CT ABDOMEN PELVIS FINDINGS Hepatobiliary: No focal liver abnormality is seen. Status post cholecystectomy. No biliary dilatation. Pancreas: Unremarkable. No pancreatic ductal dilatation or surrounding inflammatory changes. Spleen: Normal in size without focal abnormality. Adrenals/Urinary Tract: Adrenal glands are unremarkable. Kidneys are normal, without renal calculi, focal lesion, or hydronephrosis. Bladder is unremarkable. Stomach/Bowel: A duodenal diverticulum is identified without evidence of inflammation. The stomach and small bowel are otherwise normal. Colonic diverticulosis is identified without convincing evidence of diverticulitis. The remainder of the colon is unremarkable. Status post appendectomy. Vascular/Lymphatic: Dense atherosclerotic change in the nonaneurysmal abdominal aorta. Dense atherosclerotic change at the origin of the left  renal artery. No adenopathy. Reproductive: The patient may be status post hysterectomy. Recommend clinical correlation. Other: No abdominal wall hernia or abnormality. No abdominopelvic ascites. Musculoskeletal: Degenerative changes in the hips. Degenerative changes throughout the spine. Pedicle rods and screws seen at L3 and L4,  in good position. No acute spinal fracture noted. IMPRESSION: 1. Nodularity in the right lower lobe and right middle lobe primarily with a few nodules on the left. I suspect an infectious process such as MAI. The findings however are not definitive. A representative nodule in the right middle lobe measures 8 mm. Non-contrast chest CT at 3-6 months is recommended. If the nodules are stable at time of repeat CT, then future CT at 18-24 months (from today's scan) is considered optional for low-risk patients, but is recommended for high-risk patients. This recommendation follows the consensus statement: Guidelines for Management of Incidental Pulmonary Nodules Detected on CT Images: From the Fleischner Society 2017; Radiology 2017; 284:228-243. 2. Degenerative changes in the spine. No other cause for the patient's symptoms identified. 3. Atherosclerotic changes in the thoracic and abdominal aorta without aneurysm. 4. Coronary artery calcifications. Electronically Signed   By: Dorise Bullion III M.D   On: 09/30/2018 22:32   Ct Chest Wo Contrast  Result Date: 09/30/2018 CLINICAL DATA:  Chest pain since yesterday. Intermittent relief with nitroglycerin. Shortness of breath and diaphoresis. EXAM: CT CHEST, ABDOMEN AND PELVIS WITHOUT CONTRAST TECHNIQUE: Multidetector CT imaging of the chest, abdomen and pelvis was performed following the standard protocol without IV contrast. COMPARISON:  Chest x-ray September 30, 2018 FINDINGS: CT CHEST FINDINGS Cardiovascular: The thoracic aorta demonstrates atherosclerotic change without aneurysm. The fat around the thoracic aorta is normal. No evidence of leak.  Dissection can not be assessed without contrast. The central pulmonary arteries are normal in caliber. Coronary artery calcifications involve the LAD and circumflex artery primarily. The heart is unremarkable. Mediastinum/Nodes: The patient has a pectus deformity. No adenopathy. No effusion. Lungs/Pleura: Central airways are normal. No pneumothorax. Scattered nodularity in the lungs, right greater than left, particularly in the right middle lobe and posteriorly in the right lower lobe. A representative nodule in the right middle lobe measures 7.5 mm. No myalgias is. No other infiltrates. Musculoskeletal: See below. CT ABDOMEN PELVIS FINDINGS Hepatobiliary: No focal liver abnormality is seen. Status post cholecystectomy. No biliary dilatation. Pancreas: Unremarkable. No pancreatic ductal dilatation or surrounding inflammatory changes. Spleen: Normal in size without focal abnormality. Adrenals/Urinary Tract: Adrenal glands are unremarkable. Kidneys are normal, without renal calculi, focal lesion, or hydronephrosis. Bladder is unremarkable. Stomach/Bowel: A duodenal diverticulum is identified without evidence of inflammation. The stomach and small bowel are otherwise normal. Colonic diverticulosis is identified without convincing evidence of diverticulitis. The remainder of the colon is unremarkable. Status post appendectomy. Vascular/Lymphatic: Dense atherosclerotic change in the nonaneurysmal abdominal aorta. Dense atherosclerotic change at the origin of the left renal artery. No adenopathy. Reproductive: The patient may be status post hysterectomy. Recommend clinical correlation. Other: No abdominal wall hernia or abnormality. No abdominopelvic ascites. Musculoskeletal: Degenerative changes in the hips. Degenerative changes throughout the spine. Pedicle rods and screws seen at L3 and L4, in good position. No acute spinal fracture noted. IMPRESSION: 1. Nodularity in the right lower lobe and right middle lobe  primarily with a few nodules on the left. I suspect an infectious process such as MAI. The findings however are not definitive. A representative nodule in the right middle lobe measures 8 mm. Non-contrast chest CT at 3-6 months is recommended. If the nodules are stable at time of repeat CT, then future CT at 18-24 months (from today's scan) is considered optional for low-risk patients, but is recommended for high-risk patients. This recommendation follows the consensus statement: Guidelines for Management of Incidental Pulmonary Nodules Detected on CT Images: From  the Fleischner Society 2017; Radiology 2017; [phone]. 2. Degenerative changes in the spine. No other cause for the patient's symptoms identified. 3. Atherosclerotic changes in the thoracic and abdominal aorta without aneurysm. 4. Coronary artery calcifications. Electronically Signed   By: Dorise Bullion III M.D   On: 09/30/2018 22:32   Dg Chest Port 1 View  Result Date: 09/30/2018 CLINICAL DATA:  Chest pain since yesterday. EXAM: PORTABLE CHEST 1 VIEW COMPARISON:  June 26, 2017 FINDINGS: The heart size and mediastinal contours are within normal limits. Both lungs are clear. The visualized skeletal structures are unremarkable. IMPRESSION: No active disease. Electronically Signed   By: Dorise Bullion III M.D   On: 09/30/2018 20:22     ASSESSMENT and PLAN: NSTEMI vs type 2 MI: Patient has atypical chest pain with h/o significant GI disorder (Acid reflux, esophageal stricture/spasm). Her chest pain is persistent without waxing or waning. I am not sure that this is an ACS situation. Will get an echo and consider a stress test if troponin does not rise significantly. Patient is not keen on invasive procedures as well. Advised to think about it and will address at AM.  Esophageal stricture/epigastric pain: Consider GI input.

## 2018-10-01 ENCOUNTER — Inpatient Hospital Stay (HOSPITAL_COMMUNITY): Payer: Medicare Other

## 2018-10-01 ENCOUNTER — Other Ambulatory Visit: Payer: Self-pay

## 2018-10-01 ENCOUNTER — Encounter (HOSPITAL_COMMUNITY): Payer: Self-pay | Admitting: *Deleted

## 2018-10-01 DIAGNOSIS — E1165 Type 2 diabetes mellitus with hyperglycemia: Secondary | ICD-10-CM | POA: Diagnosis present

## 2018-10-01 DIAGNOSIS — E1122 Type 2 diabetes mellitus with diabetic chronic kidney disease: Secondary | ICD-10-CM | POA: Diagnosis present

## 2018-10-01 DIAGNOSIS — I214 Non-ST elevation (NSTEMI) myocardial infarction: Secondary | ICD-10-CM | POA: Diagnosis not present

## 2018-10-01 DIAGNOSIS — I5031 Acute diastolic (congestive) heart failure: Secondary | ICD-10-CM | POA: Diagnosis not present

## 2018-10-01 DIAGNOSIS — H3500 Unspecified background retinopathy: Secondary | ICD-10-CM | POA: Diagnosis not present

## 2018-10-01 DIAGNOSIS — I25118 Atherosclerotic heart disease of native coronary artery with other forms of angina pectoris: Secondary | ICD-10-CM | POA: Diagnosis present

## 2018-10-01 DIAGNOSIS — R0989 Other specified symptoms and signs involving the circulatory and respiratory systems: Secondary | ICD-10-CM | POA: Diagnosis not present

## 2018-10-01 DIAGNOSIS — N184 Chronic kidney disease, stage 4 (severe): Secondary | ICD-10-CM | POA: Diagnosis not present

## 2018-10-01 DIAGNOSIS — R300 Dysuria: Secondary | ICD-10-CM

## 2018-10-01 DIAGNOSIS — E039 Hypothyroidism, unspecified: Secondary | ICD-10-CM | POA: Diagnosis present

## 2018-10-01 DIAGNOSIS — Z66 Do not resuscitate: Secondary | ICD-10-CM | POA: Diagnosis present

## 2018-10-01 DIAGNOSIS — F1320 Sedative, hypnotic or anxiolytic dependence, uncomplicated: Secondary | ICD-10-CM | POA: Diagnosis present

## 2018-10-01 DIAGNOSIS — R2689 Other abnormalities of gait and mobility: Secondary | ICD-10-CM | POA: Diagnosis not present

## 2018-10-01 DIAGNOSIS — R739 Hyperglycemia, unspecified: Secondary | ICD-10-CM

## 2018-10-01 DIAGNOSIS — E875 Hyperkalemia: Secondary | ICD-10-CM | POA: Diagnosis present

## 2018-10-01 DIAGNOSIS — I160 Hypertensive urgency: Secondary | ICD-10-CM | POA: Diagnosis not present

## 2018-10-01 DIAGNOSIS — I10 Essential (primary) hypertension: Secondary | ICD-10-CM | POA: Diagnosis not present

## 2018-10-01 DIAGNOSIS — E1129 Type 2 diabetes mellitus with other diabetic kidney complication: Secondary | ICD-10-CM | POA: Diagnosis not present

## 2018-10-01 DIAGNOSIS — I340 Nonrheumatic mitral (valve) insufficiency: Secondary | ICD-10-CM

## 2018-10-01 DIAGNOSIS — K222 Esophageal obstruction: Secondary | ICD-10-CM | POA: Diagnosis present

## 2018-10-01 DIAGNOSIS — I130 Hypertensive heart and chronic kidney disease with heart failure and stage 1 through stage 4 chronic kidney disease, or unspecified chronic kidney disease: Secondary | ICD-10-CM | POA: Diagnosis not present

## 2018-10-01 DIAGNOSIS — I959 Hypotension, unspecified: Secondary | ICD-10-CM | POA: Diagnosis not present

## 2018-10-01 DIAGNOSIS — E785 Hyperlipidemia, unspecified: Secondary | ICD-10-CM | POA: Diagnosis not present

## 2018-10-01 DIAGNOSIS — F329 Major depressive disorder, single episode, unspecified: Secondary | ICD-10-CM | POA: Diagnosis present

## 2018-10-01 DIAGNOSIS — I213 ST elevation (STEMI) myocardial infarction of unspecified site: Secondary | ICD-10-CM | POA: Diagnosis not present

## 2018-10-01 DIAGNOSIS — R54 Age-related physical debility: Secondary | ICD-10-CM | POA: Diagnosis present

## 2018-10-01 DIAGNOSIS — I480 Paroxysmal atrial fibrillation: Secondary | ICD-10-CM | POA: Diagnosis present

## 2018-10-01 DIAGNOSIS — E639 Nutritional deficiency, unspecified: Secondary | ICD-10-CM | POA: Diagnosis not present

## 2018-10-01 DIAGNOSIS — F411 Generalized anxiety disorder: Secondary | ICD-10-CM | POA: Diagnosis present

## 2018-10-01 DIAGNOSIS — I2111 ST elevation (STEMI) myocardial infarction involving right coronary artery: Secondary | ICD-10-CM

## 2018-10-01 DIAGNOSIS — R079 Chest pain, unspecified: Secondary | ICD-10-CM

## 2018-10-01 DIAGNOSIS — G894 Chronic pain syndrome: Secondary | ICD-10-CM | POA: Diagnosis not present

## 2018-10-01 DIAGNOSIS — M179 Osteoarthritis of knee, unspecified: Secondary | ICD-10-CM | POA: Diagnosis not present

## 2018-10-01 DIAGNOSIS — M2550 Pain in unspecified joint: Secondary | ICD-10-CM | POA: Diagnosis not present

## 2018-10-01 DIAGNOSIS — M6281 Muscle weakness (generalized): Secondary | ICD-10-CM | POA: Diagnosis not present

## 2018-10-01 DIAGNOSIS — Z515 Encounter for palliative care: Secondary | ICD-10-CM | POA: Diagnosis not present

## 2018-10-01 DIAGNOSIS — R10819 Abdominal tenderness, unspecified site: Secondary | ICD-10-CM

## 2018-10-01 DIAGNOSIS — R279 Unspecified lack of coordination: Secondary | ICD-10-CM | POA: Diagnosis not present

## 2018-10-01 DIAGNOSIS — E1143 Type 2 diabetes mellitus with diabetic autonomic (poly)neuropathy: Secondary | ICD-10-CM | POA: Diagnosis present

## 2018-10-01 DIAGNOSIS — I252 Old myocardial infarction: Secondary | ICD-10-CM | POA: Diagnosis not present

## 2018-10-01 DIAGNOSIS — I259 Chronic ischemic heart disease, unspecified: Secondary | ICD-10-CM | POA: Diagnosis not present

## 2018-10-01 DIAGNOSIS — E1151 Type 2 diabetes mellitus with diabetic peripheral angiopathy without gangrene: Secondary | ICD-10-CM | POA: Diagnosis present

## 2018-10-01 DIAGNOSIS — K3184 Gastroparesis: Secondary | ICD-10-CM | POA: Diagnosis present

## 2018-10-01 DIAGNOSIS — R001 Bradycardia, unspecified: Secondary | ICD-10-CM | POA: Diagnosis present

## 2018-10-01 DIAGNOSIS — Z7401 Bed confinement status: Secondary | ICD-10-CM | POA: Diagnosis not present

## 2018-10-01 DIAGNOSIS — Z7189 Other specified counseling: Secondary | ICD-10-CM | POA: Diagnosis not present

## 2018-10-01 DIAGNOSIS — E113293 Type 2 diabetes mellitus with mild nonproliferative diabetic retinopathy without macular edema, bilateral: Secondary | ICD-10-CM | POA: Diagnosis not present

## 2018-10-01 HISTORY — DX: Dysuria: R30.0

## 2018-10-01 LAB — CBC
HCT: 36.9 % (ref 36.0–46.0)
Hemoglobin: 11.6 g/dL — ABNORMAL LOW (ref 12.0–15.0)
MCH: 30.4 pg (ref 26.0–34.0)
MCHC: 31.4 g/dL (ref 30.0–36.0)
MCV: 96.9 fL (ref 80.0–100.0)
Platelets: 215 10*3/uL (ref 150–400)
RBC: 3.81 MIL/uL — ABNORMAL LOW (ref 3.87–5.11)
RDW: 12.3 % (ref 11.5–15.5)
WBC: 11.4 10*3/uL — ABNORMAL HIGH (ref 4.0–10.5)
nRBC: 0 % (ref 0.0–0.2)

## 2018-10-01 LAB — BASIC METABOLIC PANEL
Anion gap: 11 (ref 5–15)
BUN: 43 mg/dL — ABNORMAL HIGH (ref 8–23)
CO2: 23 mmol/L (ref 22–32)
Calcium: 8.1 mg/dL — ABNORMAL LOW (ref 8.9–10.3)
Chloride: 105 mmol/L (ref 98–111)
Creatinine, Ser: 1.8 mg/dL — ABNORMAL HIGH (ref 0.44–1.00)
GFR calc Af Amer: 28 mL/min — ABNORMAL LOW (ref >60)
GFR calc non Af Amer: 24 mL/min — ABNORMAL LOW (ref >60)
Glucose, Bld: 163 mg/dL — ABNORMAL HIGH (ref 70–99)
POTASSIUM: 4.8 mmol/L (ref 3.5–5.1)
Sodium: 139 mmol/L (ref 135–145)

## 2018-10-01 LAB — TROPONIN I
TROPONIN I: 0.09 ng/mL — AB (ref <0.03)
Troponin I: 0.18 ng/mL (ref <0.03)
Troponin I: 0.96 ng/mL (ref <0.03)
Troponin I: 3 ng/mL (ref <0.03)

## 2018-10-01 LAB — GLUCOSE, CAPILLARY
GLUCOSE-CAPILLARY: 311 mg/dL — AB (ref 70–99)
Glucose-Capillary: 137 mg/dL — ABNORMAL HIGH (ref 70–99)
Glucose-Capillary: 139 mg/dL — ABNORMAL HIGH (ref 70–99)
Glucose-Capillary: 242 mg/dL — ABNORMAL HIGH (ref 70–99)
Glucose-Capillary: 170 mg/dL — ABNORMAL HIGH (ref 70–99)
Glucose-Capillary: 167 mg/dL — ABNORMAL HIGH (ref 70–99)

## 2018-10-01 LAB — ECHOCARDIOGRAM COMPLETE
Height: 63 in
Weight: 2100.8 oz

## 2018-10-01 LAB — INFLUENZA PANEL BY PCR (TYPE A & B)
Influenza A By PCR: NEGATIVE
Influenza B By PCR: NEGATIVE

## 2018-10-01 LAB — URINALYSIS, ROUTINE W REFLEX MICROSCOPIC
Bilirubin Urine: NEGATIVE
Glucose, UA: NEGATIVE mg/dL
Hgb urine dipstick: NEGATIVE
Ketones, ur: NEGATIVE mg/dL
Leukocytes,Ua: NEGATIVE
Nitrite: NEGATIVE
Protein, ur: NEGATIVE mg/dL
SPECIFIC GRAVITY, URINE: 1.012 (ref 1.005–1.030)
pH: 5 (ref 5.0–8.0)

## 2018-10-01 LAB — HEPARIN LEVEL (UNFRACTIONATED): Heparin Unfractionated: 0.57 IU/mL (ref 0.30–0.70)

## 2018-10-01 MED ORDER — HYDRALAZINE HCL 20 MG/ML IJ SOLN: 5.0000 mg | INTRAMUSCULAR | Status: DC | PRN

## 2018-10-01 MED ORDER — INSULIN ASPART 100 UNIT/ML ~~LOC~~ SOLN
0.0000 [IU] | SUBCUTANEOUS | Status: DC
  Administered 2018-10-01: 2 [IU] via SUBCUTANEOUS
  Administered 2018-10-01: 3 [IU] via SUBCUTANEOUS
  Administered 2018-10-02: 2 [IU] via SUBCUTANEOUS
  Administered 2018-10-02: 1 [IU] via SUBCUTANEOUS

## 2018-10-01 MED ORDER — INSULIN ASPART 100 UNIT/ML ~~LOC~~ SOLN: 16.0000 [IU] | Freq: Two times a day (BID) | SUBCUTANEOUS | Status: DC

## 2018-10-01 MED ORDER — INSULIN ASPART 100 UNIT/ML ~~LOC~~ SOLN: 0.0000 [IU] | Freq: Three times a day (TID) | SUBCUTANEOUS | Status: DC

## 2018-10-01 MED ORDER — ONDANSETRON HCL 4 MG/2ML IJ SOLN: 4.0000 mg | Freq: Four times a day (QID) | INTRAMUSCULAR | Status: DC | PRN

## 2018-10-01 MED ORDER — SODIUM CHLORIDE 0.9 % IV SOLN
INTRAVENOUS | Status: AC
  Administered 2018-10-01: 03:00:00 via INTRAVENOUS

## 2018-10-01 MED ORDER — NITROGLYCERIN 0.4 MG SL SUBL
SUBLINGUAL_TABLET | SUBLINGUAL | Status: AC
  Administered 2018-10-01: 0.4 mg via SUBLINGUAL
  Filled 2018-10-01: qty 1

## 2018-10-01 MED ORDER — ACETAMINOPHEN 325 MG PO TABS
650.0000 mg | ORAL_TABLET | ORAL | Status: DC | PRN
  Administered 2018-10-01: 650 mg via ORAL
  Filled 2018-10-01: qty 2

## 2018-10-01 MED ORDER — INSULIN ASPART 100 UNIT/ML ~~LOC~~ SOLN: 15.0000 [IU] | Freq: Once | SUBCUTANEOUS | Status: DC

## 2018-10-01 MED ORDER — ASPIRIN EC 81 MG PO TBEC
81.0000 mg | DELAYED_RELEASE_TABLET | Freq: Every day | ORAL | Status: DC
  Administered 2018-10-02 – 2018-10-08 (×7): 81 mg via ORAL
  Filled 2018-10-01 (×7): qty 1

## 2018-10-01 MED ORDER — HEPARIN (PORCINE) 25000 UT/250ML-% IV SOLN
800.0000 [IU]/h | INTRAVENOUS | Status: DC
  Administered 2018-10-01 – 2018-10-02 (×2): 750 [IU]/h via INTRAVENOUS
  Administered 2018-10-03: 800 [IU]/h via INTRAVENOUS
  Filled 2018-10-01 (×3): qty 250

## 2018-10-01 MED ORDER — MORPHINE SULFATE (PF) 2 MG/ML IV SOLN
2.0000 mg | INTRAVENOUS | Status: DC | PRN
  Administered 2018-10-01 – 2018-10-02 (×7): 2 mg via INTRAVENOUS
  Filled 2018-10-01 (×7): qty 1

## 2018-10-01 MED ORDER — ASPIRIN 81 MG PO CHEW
324.0000 mg | CHEWABLE_TABLET | ORAL | Status: AC
  Administered 2018-10-01: 324 mg via ORAL
  Filled 2018-10-01: qty 4

## 2018-10-01 MED ORDER — INSULIN ASPART 100 UNIT/ML ~~LOC~~ SOLN: 10.0000 [IU] | Freq: Every day | SUBCUTANEOUS | Status: DC

## 2018-10-01 MED ORDER — INSULIN LISPRO (1 UNIT DIAL) 100 UNIT/ML (KWIKPEN): 10.0000 [IU] | PEN_INJECTOR | SUBCUTANEOUS | Status: DC

## 2018-10-01 MED ORDER — ASPIRIN 325 MG PO TABS: 325.0000 mg | ORAL_TABLET | Freq: Every day | ORAL | Status: DC

## 2018-10-01 MED ORDER — ATORVASTATIN CALCIUM 10 MG PO TABS
10.0000 mg | ORAL_TABLET | Freq: Every day | ORAL | Status: DC
  Administered 2018-10-01 – 2018-10-07 (×7): 10 mg via ORAL
  Filled 2018-10-01 (×7): qty 1

## 2018-10-01 MED ORDER — HEPARIN BOLUS VIA INFUSION
3500.0000 [IU] | Freq: Once | INTRAVENOUS | Status: AC
  Administered 2018-10-01: 3500 [IU] via INTRAVENOUS
  Filled 2018-10-01: qty 3500

## 2018-10-01 MED ORDER — HEPARIN SODIUM (PORCINE) 5000 UNIT/ML IJ SOLN
5000.0000 [IU] | Freq: Three times a day (TID) | INTRAMUSCULAR | Status: DC
  Filled 2018-10-01: qty 1

## 2018-10-01 MED ORDER — INSULIN GLARGINE 100 UNIT/ML ~~LOC~~ SOLN
36.0000 [IU] | Freq: Every day | SUBCUTANEOUS | Status: DC
  Administered 2018-10-01 – 2018-10-07 (×8): 36 [IU] via SUBCUTANEOUS
  Filled 2018-10-01 (×9): qty 0.36

## 2018-10-01 MED ORDER — NITROGLYCERIN IN D5W 200-5 MCG/ML-% IV SOLN
0.0000 ug/min | INTRAVENOUS | Status: DC
  Administered 2018-10-01: 10 ug/min via INTRAVENOUS
  Administered 2018-10-02: 40 ug/min via INTRAVENOUS
  Administered 2018-10-03 – 2018-10-04 (×2): 35 ug/min via INTRAVENOUS
  Filled 2018-10-01 (×4): qty 250

## 2018-10-01 MED ORDER — TRAMADOL HCL 50 MG PO TABS
50.0000 mg | ORAL_TABLET | Freq: Two times a day (BID) | ORAL | Status: DC | PRN
  Administered 2018-10-01 (×2): 50 mg via ORAL
  Filled 2018-10-01 (×3): qty 1

## 2018-10-01 NOTE — Progress Notes (Signed)
ANTICOAGULATION CONSULT NOTE - Initial Consult  Pharmacy Consult for heparin Indication: chest pain/ACS  Allergies  Allergen Reactions  . Celebrex [Celecoxib] Rash  . Cymbalta [Duloxetine Hcl] Rash    Lip numbness,rash, leg and body jerks. Patient states " I though I was having a heart attack or stroke."   . Doxycycline Rash  . Lyrica [Pregabalin] Rash  . Avandia [Rosiglitazone] Rash  . Macrodantin [Nitrofurantoin Macrocrystal] Rash  . Silicone Rash    Gets rash from the touch it.  . Sulfa Antibiotics Rash  . Vioxx [Rofecoxib] Rash    Patient Measurements: Height: 5\' 3"  (160 cm) Weight: 131 lb 4.8 oz (59.6 kg) IBW/kg (Calculated) : 52.4 Heparin Dosing Weight: 60kg  Vital Signs: Temp: 97.9 F (36.6 C) (03/02 0701) Temp Source: Oral (03/02 0701) BP: 156/61 (03/02 1347) Pulse Rate: 62 (03/02 0701)  Labs: Recent Labs    09/30/18 1928 10/01/18 0217 10/01/18 0643 10/01/18 1255 10/01/18 1643  HGB 12.9  --  11.6*  --   --   HCT 41.8  --  36.9  --   --   PLT 257  --  215  --   --   LABPROT 13.3  --   --   --   --   INR 1.0  --   --   --   --   HEPARINUNFRC  --   --   --   --  0.57  CREATININE 2.16*  --  1.80*  --   --   TROPONINI 0.06* 0.09* 0.18* 0.96*  --     Estimated Creatinine Clearance: 16.5 mL/min (A) (by C-G formula based on SCr of 1.8 mg/dL (H)).   Medical History: Past Medical History:  Diagnosis Date  . A-fib ([place])   . Abnormal involuntary movements(781.0)   . Anemia   . Anxiety   . Anxiety state, unspecified   . Arthritis    body, neck  . Benign hypertensive heart disease   . Benign hypertensive heart disease with congestive heart failure ([place])   . Bladder disorder    over active  . Cancer ([place])    skin, removed  . Cervicalgia   . CHF (congestive heart failure) ([place])   . Chronic kidney disease, stage III (moderate) ([facility])   . Congestive heart failure, unspecified   . Contact dermatitis and other eczema, due to unspecified cause   . Corns  and callosities   . Diabetes mellitus without complication ([place])   . Diarrhea   . Dyspnea 02/18/2015  . Edema   . Esophageal stricture   . Gastroparesis   . Gastroparesis   . GERD (gastroesophageal reflux disease)   . Hiatal hernia   . Hyperlipidemia   . Inflamed seborrheic keratosis   . Inflammatory disease of breast   . Irregular heartbeat   . Lumbago   . Mixed incontinence urge and stress (female)(female)   . Nausea alone   . Nonspecific (abnormal) findings on radiological and other examination of skull and head   . Osteoarthrosis, unspecified whether generalized or localized, unspecified site   . Other and unspecified hyperlipidemia   . Other B-complex deficiencies   . Other functional disorder of bladder   . Other malaise and fatigue   . Other specified visual disturbances   . Pain in joint, lower leg   . Palpitations   . Peripheral vascular disease, unspecified ([place])   . Postmenopausal atrophic vaginitis   . Reflux esophagitis   . Seborrheic keratosis   . Sleep  apnea   . Spasm of muscle   . Spinal stenosis   . Spinal stenosis, unspecified region other than cervical   . Stricture and stenosis of esophagus   . Stroke ([place])   . TIA (transient ischemic attack) 2012  . Transient ischemic attack (TIA), and cerebral infarction without residual deficits(V12.54)   . Type II or unspecified type diabetes mellitus with renal manifestations, not stated as uncontrolled(250.40)   . Unspecified constipation   . Unspecified essential hypertension   . Unspecified hereditary and idiopathic peripheral neuropathy   . Unspecified hypothyroidism   . Unspecified pruritic disorder   . Unspecified sleep apnea   . Unspecified vitamin D deficiency   . Urinary tract infection, site not specified   . Vitamin B12 deficiency    Assessment: 85 year old female with chest pain and cardiac enzymes trending up. Orders to start IV heparin. Patient does not appear to be on anticoagulation prior to  admit. CBC ok.   Initial heparin level therapeutic at 0.57  Goal of Therapy:  Heparin level 0.3-0.7 units/ml Monitor platelets by anticoagulation protocol: Yes   Plan:  Continue heparin gtt at 750 units/hr Daily heparin level, CBC, s/s bleeding  Bertis Ruddy, PharmD Clinical Pharmacist Please check AMION for all [place] numbers 10/01/2018 5:25 PM

## 2018-10-01 NOTE — H&P (Signed)
History and Physical    Karen [name] ERD:[id number] DOB: Aug 03, 1923 DOA: 09/30/2018  PCP: Jinny Sanders, MD Patient coming from: Home  Chief Complaint: Chest pain  HPI: Karen [name] is a 85 y.o. female with medical history significant of hypertension, hyperlipidemia, CKD stage IV, OSA, GERD, type 2 diabetes, paroxysmal atrial fibrillation, prior TIA/CVA, anxiety, history of esophageal stricture status post dilation and hiatal hernia on PPI presenting to the hospital for evaluation of chest pain. Patient states her chest pain started 2/29 at 5 AM and woke her up from her sleep.  She describes it as sharp right-sided chest pain which radiates to her right shoulder and right arm.  Associated with shortness of breath.  No nausea or diaphoresis.  2 hours later she spoke to her son over the phone who advised her to take sublingual nitroglycerin.  Her chest pain improved a little with sublingual nitroglycerin but persisted the entire day.  She took additional nitroglycerin around dinnertime which helped a little but again the chest pain persisted.  The following day she continued to have chest pain and took multiple doses of sublingual nitroglycerin with no improvement.  Patient received another dose of sublingual nitroglycerin in the ED but continues to have chest pain.  She is a never smoker.  Patient thinks her chest pain might be indigestion. Family at bedside states that patient has a history of esophageal stricture and several dilations done in the past.  Unclear why patient has sublingual nitroglycerin at home as family states she does not have any history of coronary artery disease.   Review of Systems: As per HPI otherwise 10 point review of systems negative.  Past Medical History:  Diagnosis Date  . A-fib ([place])   . Abnormal involuntary movements(781.0)   . Anemia   . Anxiety   . Anxiety state, unspecified   . Arthritis    body, neck  . Benign hypertensive heart disease   .  Benign hypertensive heart disease with congestive heart failure ([place])   . Bladder disorder    over active  . Cancer ([place])    skin, removed  . Cervicalgia   . CHF (congestive heart failure) ([place])   . Chronic kidney disease, stage III (moderate) (HCC)   . Congestive heart failure, unspecified   . Contact dermatitis and other eczema, due to unspecified cause   . Corns and callosities   . Diabetes mellitus without complication ([place])   . Diarrhea   . Dyspnea 02/18/2015  . Edema   . Esophageal stricture   . Gastroparesis   . Gastroparesis   . GERD (gastroesophageal reflux disease)   . Hiatal hernia   . Hyperlipidemia   . Inflamed seborrheic keratosis   . Inflammatory disease of breast   . Irregular heartbeat   . Lumbago   . Mixed incontinence urge and stress (female)(female)   . Nausea alone   . Nonspecific (abnormal) findings on radiological and other examination of skull and head   . Osteoarthrosis, unspecified whether generalized or localized, unspecified site   . Other and unspecified hyperlipidemia   . Other B-complex deficiencies   . Other functional disorder of bladder   . Other malaise and fatigue   . Other specified visual disturbances   . Pain in joint, lower leg   . Palpitations   . Peripheral vascular disease, unspecified ([place])   . Postmenopausal atrophic vaginitis   . Reflux esophagitis   . Seborrheic keratosis   . Sleep apnea   .  Spasm of muscle   . Spinal stenosis   . Spinal stenosis, unspecified region other than cervical   . Stricture and stenosis of esophagus   . Stroke ([place])   . TIA (transient ischemic attack) 2012  . Transient ischemic attack (TIA), and cerebral infarction without residual deficits(V12.54)   . Type II or unspecified type diabetes mellitus with renal manifestations, not stated as uncontrolled(250.40)   . Unspecified constipation   . Unspecified essential hypertension   . Unspecified hereditary and idiopathic peripheral neuropathy   .  Unspecified hypothyroidism   . Unspecified pruritic disorder   . Unspecified sleep apnea   . Unspecified vitamin D deficiency   . Urinary tract infection, site not specified   . Vitamin B12 deficiency     Past Surgical History:  Procedure Laterality Date  . ABDOMINAL HYSTERECTOMY    . APPENDECTOMY    . BACK SURGERY     x 2  . CESAREAN SECTION    . CHOLECYSTECTOMY  1991  . esophageal  2004,2006   dilation, Dr Lyla Son  . [place]   bilateral cataract  . hammer toes    . HERNIA REPAIR  1984aaaaaaaa  . SKIN CANCER DESTRUCTION    . THYROIDECTOMY, PARTIAL  1965     reports that she has never smoked. She has never used smokeless tobacco. She reports that she does not drink alcohol or use drugs.  Allergies  Allergen Reactions  . Celebrex [Celecoxib] Rash  . Cymbalta [Duloxetine Hcl] Rash    Lip numbness,rash, leg and body jerks. Patient states " I though I was having a heart attack or stroke."   . Doxycycline Rash  . Lyrica [Pregabalin] Rash  . Avandia [Rosiglitazone]   . Macrodantin [Nitrofurantoin Macrocrystal]   . Sulfa Antibiotics   . Vioxx [Rofecoxib]   . Silicone Rash    Gets rash from the touch it.    Family History  Problem Relation Age of Onset  . Cancer Mother        ? stomach  . Heart disease Father   . Hyperlipidemia Father   . Hypertension Father   . Heart attack Father   . Heart disease Brother   . Kidney disease Daughter   . Heart disease Brother   . Cancer Son     Prior to Admission medications   Medication Sig Start Date End Date Taking? Authorizing Provider  ALPRAZolam (XANAX) 1 MG tablet TAKE ONE-HALF TABLET BY MOUTH THREE TIMES A DAY AND TAKE ONE TABLET AT BEDTIME 09/13/18   Bedsole, Amy E, MD  aspirin 325 MG EC tablet Take 325 mg by mouth daily.    [provider]  CRANBERRY PO Take 300 mg by mouth daily.    [provider]  [facility] PO Take 1 tablet by mouth daily.     [provider]  diclofenac sodium (VOLTAREN) 1 % GEL Apply 4 g topically 4 (four) times daily. To bilateral knees 03/27/17   Reed, Tiffany L, DO  digoxin (LANOXIN) 0.125 MG tablet TAKE ONE TABLET BY MOUTH ON MONDAY, WEDNESDAY, FRIDAY, AND SATURDAY. Patient taking differently: TAKE ONE TABLET BY MOUTH ON MONDAY, WEDNESDAY, FRIDAY, AND SATURDAY. 04/16/18   Gildardo Cranker, DO  furosemide (LASIX) 40 MG tablet TAKE 1 TABLET BY MOUTH DAILY TO CONTROL EDEMA 07/09/18   Bedsole, Amy E, MD  gabapentin (NEURONTIN) 100 MG capsule TAKE TWO CAPSULES BY MOUTH AT BEDTIME TO HELP WITH TREMORS 07/17/18  Reed, Tiffany L, DO  glucagon (GLUCAGON EMERGENCY) 1 MG injection Inject 1 mg into the muscle once as needed for up to 1 dose. 08/21/18   Philemon Kingdom, MD  HYDROcodone-acetaminophen (NORCO) 7.5-325 MG tablet Take 1 tablet by mouth 4 (four) times daily. 08/16/18   Bedsole, Amy E, MD  HYDROcodone-acetaminophen (NORCO) 7.5-325 MG tablet Take 1 tablet by mouth 4 (four) times daily. DO NOT FILL UNTIL 09/19/2018 08/16/18   Bedsole, Amy E, MD  HYDROcodone-acetaminophen (NORCO) 7.5-325 MG tablet Take 1 tablet by mouth 4 (four) times daily. 08/16/18   Bedsole, Amy E, MD  insulin lispro (HUMALOG) 100 UNIT/ML KwikPen Inject under skin up to 50 units a day as advised. Dx:E11.29, Normal 08/23/18   Philemon Kingdom, MD  Insulin Pen Needle (B-D ULTRAFINE III SHORT PEN) 31G X 8 MM MISC Use with administering insulin. Dx. E11.29 08/06/18   Bedsole, Amy E, MD  LANTUS SOLOSTAR 100 UNIT/ML Solostar Pen INJECT 42 UNITS INTO THE SKIN AT BEDTIME Patient taking differently: Inject 42 Units into the skin at bedtime.  01/01/18   Gildardo Cranker, DO  levothyroxine (SYNTHROID, LEVOTHROID) 125 MCG tablet TAKE ONE TABLET BY MOUTH EVERY MORNING 30 MINUTES BEFORE BREAKFAST 07/12/18   Reed, Tiffany L, DO  metoprolol tartrate (LOPRESSOR) 50 MG tablet TAKE 1 TABLET BY MOUTH TWICE A DAY TO CONTROL HEART RATE Patient taking differently: Take 50 mg by mouth 2  (two) times daily.  05/22/18   Bedsole, Amy E, MD  Multiple Vitamins-Minerals (PRESERVISION AREDS 2 PO) Take 1 tablet by mouth 2 (two) times daily.     [provider]  nitroGLYCERIN (NITROSTAT) 0.4 MG SL tablet Place 1 tablet (0.4 mg total) under the tongue every 5 (five) minutes as needed for chest pain. 09/20/16   Estill Dooms, MD  omeprazole (PRILOSEC) 40 MG capsule TAKE ONE (1) CAPSULE BY MOUTH 2 TIMES DAILY FOR STOMACH 09/07/18   Bedsole, Amy E, MD  ondansetron (ZOFRAN) 4 MG tablet TAKE ONE TABLET BY MOUTH EVERY 4 HOURS AS NEEDED FOR NAUSEA/VOMITING Patient taking differently: Take 4 mg by mouth every 8 (eight) hours as needed.  04/19/17   Reed, Tiffany L, DO  ONE TOUCH ULTRA TEST test strip Use to test sugar three times daily dx E11.29 08/16/18   Jinny Sanders, MD  Polyethyl Glycol-Propyl Glycol (SYSTANE OP) Place 1 drop into both eyes 2 (two) times daily.     [provider]  triamcinolone cream (KENALOG) 0.1 % Apply 1 application topically as needed. Mix with CeraVe cream apply twice daily for dry skin 07/21/17   [provider]  trimethoprim (TRIMPEX) 100 MG tablet TAKE ONE TABLET BY MOUTH EVERY NIGHT AT BEDTIME TO PREVENT BLADDER INFECTION. Patient taking differently: Take 100 mg by mouth at bedtime.  04/05/18   Bedsole, Amy E, MD  Vitamin D, Ergocalciferol, (DRISDOL) 50000 units CAPS capsule TAKE ONE CAPSULE BY MOUTH ON THE SAME DAY EVERY WEEK AS DIRECTED Patient taking differently: Take 50,000 Units by mouth every 7 (seven) days.  03/19/18   Gildardo Cranker, DO  VITAMIN E PO Take 400 Units by mouth daily.    [provider]    Physical Exam: Vitals:   09/30/18 2335 09/30/18 2348 10/01/18 0100 10/01/18 0207  BP: (!) 211/70  (!) 161/98 (!) 156/54  Pulse: 62 66 64 66  Resp: (!) 21 (!) 24 (!) 29 (!) 22  Temp:  98.4 F (36.9 C)  97.6 F (36.4 C)  TempSrc:  Rectal  Axillary  SpO2: 93% 92% 94% 96%  Weight:    59.6 kg  Height:    _0  (1.6 m)     Physical Exam  Constitutional: She is oriented to person, place, and time. No distress.  HENT:  Head: Normocephalic.  Mouth/Throat: Oropharynx is clear and moist.  Eyes: Right eye exhibits no discharge. Left eye exhibits no discharge.  Neck: Neck supple.  Cardiovascular: Normal rate, regular rhythm and intact distal pulses.  Pulmonary/Chest: Effort normal and breath sounds normal. No respiratory distress. She has no wheezes. She has no rales.  Abdominal: Soft. Bowel sounds are normal. She exhibits no distension. There is abdominal tenderness. There is no rebound and no guarding.  Lower quadrants tender to palpation  Musculoskeletal:        General: No edema.     Comments: Sternum tender to palpation; chest pain reproducible on exam  Neurological: She is alert and oriented to person, place, and time.  Skin: Skin is warm and dry. She is not diaphoretic.     Labs on Admission: I have personally reviewed following labs and imaging studies  CBC: Recent Labs  Lab 09/30/18 1928  WBC 11.5*  NEUTROABS 8.3*  HGB 12.9  HCT 41.8  MCV 99.1  PLT 147   Basic Metabolic Panel: Recent Labs  Lab 09/30/18 1928  NA 129*  K 5.4*  CL 93*  CO2 23  GLUCOSE 328*  BUN 51*  CREATININE 2.16*  CALCIUM 8.2*   GFR: Estimated Creatinine Clearance: 13.7 mL/min (A) (by C-G formula based on SCr of 2.16 mg/dL (H)). Liver Function Tests: Recent Labs  Lab 09/30/18 1928  AST 26  ALT 24  ALKPHOS 131*  BILITOT 0.3  PROT 7.3  ALBUMIN 4.3   Recent Labs  Lab 09/30/18 1928  LIPASE 20   No results for input(s): AMMONIA in the last 168 hours. Coagulation Profile: Recent Labs  Lab 09/30/18 1928  INR 1.0   Cardiac Enzymes: Recent Labs  Lab 09/30/18 1928  TROPONINI 0.06*   BNP (last 3 results) No results for input(s): PROBNP in the last 8760 hours. HbA1C: No results for input(s): HGBA1C in the last 72 hours. CBG: Recent Labs  Lab 10/01/18 0245  GLUCAP 311*   Lipid  Profile: No results for input(s): CHOL, HDL, LDLCALC, TRIG, CHOLHDL, LDLDIRECT in the last 72 hours. Thyroid Function Tests: No results for input(s): TSH, T4TOTAL, FREET4, T3FREE, THYROIDAB in the last 72 hours. Anemia Panel: No results for input(s): VITAMINB12, FOLATE, FERRITIN, TIBC, IRON, RETICCTPCT in the last 72 hours. Urine analysis:    Component Value Date/Time   COLORURINE STRAW (A) 06/26/2018 1231   APPEARANCEUR CLEAR 06/26/2018 1231   LABSPEC 1.009 06/26/2018 1231   PHURINE 5.0 06/26/2018 1231   GLUCOSEU 50 (A) 06/26/2018 1231   HGBUR NEGATIVE 06/26/2018 1231   BILIRUBINUR NEGATIVE 06/26/2018 1231   BILIRUBINUR Neg 05/30/2017 1329   KETONESUR NEGATIVE 06/26/2018 1231   PROTEINUR NEGATIVE 06/26/2018 1231   UROBILINOGEN 0.2 05/30/2017 1329   UROBILINOGEN 0.2 12/27/2010 0505   NITRITE NEGATIVE 06/26/2018 1231   LEUKOCYTESUR SMALL (A) 06/26/2018 1231    Radiological Exams on Admission: Ct Abdomen Pelvis Wo Contrast  Result Date: 09/30/2018 CLINICAL DATA:  Chest pain since yesterday. Intermittent relief with nitroglycerin. Shortness of breath and diaphoresis. EXAM: CT CHEST, ABDOMEN AND PELVIS WITHOUT CONTRAST TECHNIQUE: Multidetector CT imaging of the chest, abdomen and pelvis was performed following the standard protocol without IV contrast. COMPARISON:  Chest x-ray September 30, 2018 FINDINGS: CT CHEST FINDINGS Cardiovascular:  The thoracic aorta demonstrates atherosclerotic change without aneurysm. The fat around the thoracic aorta is normal. No evidence of leak. Dissection can not be assessed without contrast. The central pulmonary arteries are normal in caliber. Coronary artery calcifications involve the LAD and circumflex artery primarily. The heart is unremarkable. Mediastinum/Nodes: The patient has a pectus deformity. No adenopathy. No effusion. Lungs/Pleura: Central airways are normal. No pneumothorax. Scattered nodularity in the lungs, right greater than left, particularly in the  right middle lobe and posteriorly in the right lower lobe. A representative nodule in the right middle lobe measures 7.5 mm. No myalgias is. No other infiltrates. Musculoskeletal: See below. CT ABDOMEN PELVIS FINDINGS Hepatobiliary: No focal liver abnormality is seen. Status post cholecystectomy. No biliary dilatation. Pancreas: Unremarkable. No pancreatic ductal dilatation or surrounding inflammatory changes. Spleen: Normal in size without focal abnormality. Adrenals/Urinary Tract: Adrenal glands are unremarkable. Kidneys are normal, without renal calculi, focal lesion, or hydronephrosis. Bladder is unremarkable. Stomach/Bowel: A duodenal diverticulum is identified without evidence of inflammation. The stomach and small bowel are otherwise normal. Colonic diverticulosis is identified without convincing evidence of diverticulitis. The remainder of the colon is unremarkable. Status post appendectomy. Vascular/Lymphatic: Dense atherosclerotic change in the nonaneurysmal abdominal aorta. Dense atherosclerotic change at the origin of the left renal artery. No adenopathy. Reproductive: The patient may be status post hysterectomy. Recommend clinical correlation. Other: No abdominal wall hernia or abnormality. No abdominopelvic ascites. Musculoskeletal: Degenerative changes in the hips. Degenerative changes throughout the spine. Pedicle rods and screws seen at L3 and L4, in good position. No acute spinal fracture noted. IMPRESSION: 1. Nodularity in the right lower lobe and right middle lobe primarily with a few nodules on the left. I suspect an infectious process such as MAI. The findings however are not definitive. A representative nodule in the right middle lobe measures 8 mm. Non-contrast chest CT at 3-6 months is recommended. If the nodules are stable at time of repeat CT, then future CT at 18-24 months (from today's scan) is considered optional for low-risk patients, but is recommended for high-risk patients. This  recommendation follows the consensus statement: Guidelines for Management of Incidental Pulmonary Nodules Detected on CT Images: From the Fleischner Society 2017; Radiology 2017; 284:228-243. 2. Degenerative changes in the spine. No other cause for the patient's symptoms identified. 3. Atherosclerotic changes in the thoracic and abdominal aorta without aneurysm. 4. Coronary artery calcifications. Electronically Signed   By: Dorise Bullion III M.D   On: 09/30/2018 22:32   Ct Chest Wo Contrast  Result Date: 09/30/2018 CLINICAL DATA:  Chest pain since yesterday. Intermittent relief with nitroglycerin. Shortness of breath and diaphoresis. EXAM: CT CHEST, ABDOMEN AND PELVIS WITHOUT CONTRAST TECHNIQUE: Multidetector CT imaging of the chest, abdomen and pelvis was performed following the standard protocol without IV contrast. COMPARISON:  Chest x-ray September 30, 2018 FINDINGS: CT CHEST FINDINGS Cardiovascular: The thoracic aorta demonstrates atherosclerotic change without aneurysm. The fat around the thoracic aorta is normal. No evidence of leak. Dissection can not be assessed without contrast. The central pulmonary arteries are normal in caliber. Coronary artery calcifications involve the LAD and circumflex artery primarily. The heart is unremarkable. Mediastinum/Nodes: The patient has a pectus deformity. No adenopathy. No effusion. Lungs/Pleura: Central airways are normal. No pneumothorax. Scattered nodularity in the lungs, right greater than left, particularly in the right middle lobe and posteriorly in the right lower lobe. A representative nodule in the right middle lobe measures 7.5 mm. No myalgias is. No other infiltrates. Musculoskeletal: See below. CT  ABDOMEN PELVIS FINDINGS Hepatobiliary: No focal liver abnormality is seen. Status post cholecystectomy. No biliary dilatation. Pancreas: Unremarkable. No pancreatic ductal dilatation or surrounding inflammatory changes. Spleen: Normal in size without focal  abnormality. Adrenals/Urinary Tract: Adrenal glands are unremarkable. Kidneys are normal, without renal calculi, focal lesion, or hydronephrosis. Bladder is unremarkable. Stomach/Bowel: A duodenal diverticulum is identified without evidence of inflammation. The stomach and small bowel are otherwise normal. Colonic diverticulosis is identified without convincing evidence of diverticulitis. The remainder of the colon is unremarkable. Status post appendectomy. Vascular/Lymphatic: Dense atherosclerotic change in the nonaneurysmal abdominal aorta. Dense atherosclerotic change at the origin of the left renal artery. No adenopathy. Reproductive: The patient may be status post hysterectomy. Recommend clinical correlation. Other: No abdominal wall hernia or abnormality. No abdominopelvic ascites. Musculoskeletal: Degenerative changes in the hips. Degenerative changes throughout the spine. Pedicle rods and screws seen at L3 and L4, in good position. No acute spinal fracture noted. IMPRESSION: 1. Nodularity in the right lower lobe and right middle lobe primarily with a few nodules on the left. I suspect an infectious process such as MAI. The findings however are not definitive. A representative nodule in the right middle lobe measures 8 mm. Non-contrast chest CT at 3-6 months is recommended. If the nodules are stable at time of repeat CT, then future CT at 18-24 months (from today's scan) is considered optional for low-risk patients, but is recommended for high-risk patients. This recommendation follows the consensus statement: Guidelines for Management of Incidental Pulmonary Nodules Detected on CT Images: From the Fleischner Society 2017; Radiology 2017; 284:228-243. 2. Degenerative changes in the spine. No other cause for the patient's symptoms identified. 3. Atherosclerotic changes in the thoracic and abdominal aorta without aneurysm. 4. Coronary artery calcifications. Electronically Signed   By: Dorise Bullion III M.D    On: 09/30/2018 22:32   Dg Chest Port 1 View  Result Date: 09/30/2018 CLINICAL DATA:  Chest pain since yesterday. EXAM: PORTABLE CHEST 1 VIEW COMPARISON:  June 26, 2017 FINDINGS: The heart size and mediastinal contours are within normal limits. Both lungs are clear. The visualized skeletal structures are unremarkable. IMPRESSION: No active disease. Electronically Signed   By: Dorise Bullion III M.D   On: 09/30/2018 20:22    EKG: Independently reviewed.  Sinus rhythm, no acute ischemic changes.  Assessment/Plan Principal Problem:   Chest pain Active Problems:   Hyperkalemia   Hypertensive urgency   Abdominal tenderness   Dysuria   Hyperglycemia   Atypical chest pain -Patient has multiple significant risk factors for coronary artery disease and CT with evidence of coronary calcifications.  However, chest pain has been persistent for over 36 hours despite taking multiple dose of sublingual nitroglycerin. -Troponin 0.06.  EKG not suggestive of ACS. -There could be a possible musculoskeletal component as chest pain reproducible on exam. -Patient was seen by cardiology and her chest pain was thought to be atypical given history of significant GI disorder (GERD, esophageal stricture and spasm).  Chest pain is persistent without waxing or waning.  ACS thought to be less likely.  Recommendation is to get an echo and consider stress test if troponin rises significantly.   -Patient is followed by outpatient GI (Dr. Henrene Pastor).  Last note from July 2016.  She will need follow-up with GI. -Cardiac monitoring -Continue to trend troponin -Echocardiogram -Unable to give NSAIDs for musculoskeletal pain in the setting of CKD stage IV. Will try tramadol PRN, Tylenol PRN. -Continue PPI  Hypertensive urgency Blood pressure significantly elevated on  arrival (205/69).  Troponin mildly elevated, however, EKG without acute changes.  Renal function at baseline.  Blood pressure improved significantly after  receiving 1 dose of sublingual nitroglycerin in the ED.  Most recent reading 156/54. -Continue to monitor -Hydralazine PRN  Abdominal tenderness -Bilateral lower quadrants tender to palpation on exam.  No rebound, guarding, or rigidity.  Lipase normal.  Alk phos mildly elevated, remainder of LFTs normal.  Status post cholecystectomy.  Patient denies having any nausea or vomiting.  CT showing colonic diverticulosis without evidence of diverticulitis.  Status post appendectomy. -Check UA to rule out UTI -Tylenol PRN, tramadol PRN  ?MAI infection -CT chest showing nodularity in the right lower lobe and right middle lobe primarily with a few nodules on the left, suspected to be an infectious process such as MAI, however, findings not definitive.  There is also a nodule in the right middle lobe measuring 8 mm. -Patient denies any fevers, night sweats, weight loss, or malaise. -White count borderline elevated at 11.5.  Patient is afebrile and nontoxic-appearing. -Received ceftriaxone and azithromycin in the ED -Discuss with ID in the morning -Continue to monitor CBC -Patient will need repeat noncontrast CT in 3 months to ensure resolution. -Blood culture x2  Dysuria -Patient reports dysuria.  Afebrile but white count borderline elevated. -Check UA  Mild hyperkalemia Potassium 5.4.  EKG without acute changes. -IV fluid hydration -Repeat BMP in a.m.  Hyperglycemia, history of type 2 diabetes -Blood glucose 328.  Labs not suggestive of DKA (bicarb 23, anion gap 13).  Patient reports missing her evening dose of insulin as she had to come into the ED. -IV fluid hydration -NovoLog 15 units once -Continue home Lantus 36 units at bedtime -Continue home Humalog 10 units with breakfast, 16 units with lunch, 16 units with dinner -Sliding scale insulin and CBG checks  CKD stage IV -Stable.  Creatinine 2.1, similar to labs done a month ago. -Continue monitor renal function  Unable to safely  order home medications at this time as pharmacy medication reconciliation is pending.  DVT prophylaxis: Subcutaneous heparin Code Status: DNR.  Discussed with patient and son at bedside. Family Communication: Son and daughter-in-law at bedside. Disposition Plan: Anticipate discharge after clinical improvement. Consults called: Cardiology  Admission status: Observation   Shela Leff MD [facility] Pager [phone]  If 7PM-7AM, please contact night-coverage [email or web address] Password [email or web address]  10/01/2018, 3:03 AM

## 2018-10-01 NOTE — Progress Notes (Signed)
Progress Note  Patient Name: Karen [name] Date of Encounter: 10/01/2018  Primary Cardiologist: New to [facility]   Subjective   Karen [name] is a 85 year old female with a history of leg edema chronic kidney disease and history of falls. Is admitted to the hospital last night with episodes of chest pain.  Chest pains have been present since Saturday-[place] 48 hours ago. The EKG on admission showed an old anterior wall myocardial infarction.  This morning around 7 the patient had intensification of her chest pain.  EKG at that time revealed acute ST segment elevation in the inferior leads.  The ST segment elevation was also clearly visible on telemetry.  She is the mother in law of a cardiac nurse, Doug Sou ( who now works in Economist at [employer] )   She was given 2 mg of IV morphine.  She was given initially sublingual nitroglycerin and then later on nitroglycerin drip. She is gradually feeling better.  Her pain is down from a 10/10 now down to a 6/10.  The segments are clearly better on telemetry.  Drip is been started.  She is received aspirin.  Inpatient Medications    Scheduled Meds: . [START ON 10/02/2018] aspirin  325 mg Oral Daily  . insulin aspart  0-9 Units Subcutaneous TID WC  . insulin aspart  10 Units Subcutaneous Q breakfast   And  . insulin aspart  16 Units Subcutaneous BID AC  . insulin aspart  15 Units Subcutaneous Once  . insulin glargine  36 Units Subcutaneous QHS  . nitroGLYCERIN       Continuous Infusions: . sodium chloride 100 mL/hr at 10/01/18 0310  . heparin 750 Units/hr (10/01/18 0837)  . nitroGLYCERIN 10 mcg/min (10/01/18 0836)   PRN Meds: acetaminophen, hydrALAZINE, morphine injection, ondansetron (ZOFRAN) IV, traMADol   Vital Signs    Vitals:   09/30/18 2348 10/01/18 0100 10/01/18 0207 10/01/18 0701  BP:  (!) 161/98 (!) 156/54 (!) 152/55  Pulse: 66 64 66 62  Resp: (!) 24 (!) 29 (!) 22 17  Temp: 98.4 F (36.9 C)  97.6 F (36.4 C) 97.9  F (36.6 C)  TempSrc: Rectal  Axillary Oral  SpO2: 92% 94% 96% 99%  Weight:   59.6 kg   Height:   5\' 3"  (1.6 m)     Intake/Output Summary (Last 24 hours) at 10/01/2018 2500 Last data filed at 10/01/2018 0100 Gross per 24 hour  Intake -  Output 400 ml  Net -400 ml   Last 3 Weights 10/01/2018 09/30/2018 08/21/2018  Weight (lbs) 131 lb 4.8 oz 136 lb 137 lb  Weight (kg) 59.557 kg 61.689 kg 62.143 kg      Telemetry    NSR ,  ST elevation inferiorly  - Personally Reviewed  ECG     Acute Inf. MI,  [place] MI  - Personally Reviewed  Physical Exam   GEN:  Elderly, frail, white female, in mild distress..   Neck: No JVD Cardiac:  Rate S1-S2.  She has a 2/6 systolic ejection murmur at the left sternal border.  There is no radiation to the right sternal border and I did not hear any radiation to the axilla. Respiratory: Clear to auscultation bilaterally. GI: Soft, nontender, non-distended  MS: No edema; No deformity. Neuro:  Nonfocal  Psych: Normal affect   Labs    Chemistry Recent Labs  Lab 09/30/18 1928 10/01/18 0643  NA 129* 139  K 5.4* 4.8  CL 93* 105  CO2 23  23  GLUCOSE 328* 163*  BUN 51* 43*  CREATININE 2.16* 1.80*  CALCIUM 8.2* 8.1*  PROT 7.3  --   ALBUMIN 4.3  --   AST 26  --   ALT 24  --   ALKPHOS 131*  --   BILITOT 0.3  --   GFRNONAA 19* 24*  GFRAA 22* 28*  ANIONGAP 13 11     Hematology Recent Labs  Lab 09/30/18 1928 10/01/18 0643  WBC 11.5* 11.4*  RBC 4.22 3.81*  HGB 12.9 11.6*  HCT 41.8 36.9  MCV 99.1 96.9  MCH 30.6 30.4  MCHC 30.9 31.4  RDW 12.4 12.3  PLT 257 215    Cardiac Enzymes Recent Labs  Lab 09/30/18 1928 10/01/18 0217 10/01/18 0643  TROPONINI 0.06* 0.09* 0.18*   No results for input(s): TROPIPOC in the last 168 hours.   BNPNo results for input(s): BNP, PROBNP in the last 168 hours.   DDimer No results for input(s): DDIMER in the last 168 hours.   Radiology    Ct Abdomen Pelvis Wo Contrast  Result Date: 09/30/2018 CLINICAL  DATA:  Chest pain since yesterday. Intermittent relief with nitroglycerin. Shortness of breath and diaphoresis. EXAM: CT CHEST, ABDOMEN AND PELVIS WITHOUT CONTRAST TECHNIQUE: Multidetector CT imaging of the chest, abdomen and pelvis was performed following the standard protocol without IV contrast. COMPARISON:  Chest x-ray September 30, 2018 FINDINGS: CT CHEST FINDINGS Cardiovascular: The thoracic aorta demonstrates atherosclerotic change without aneurysm. The fat around the thoracic aorta is normal. No evidence of leak. Dissection can not be assessed without contrast. The central pulmonary arteries are normal in caliber. Coronary artery calcifications involve the LAD and circumflex artery primarily. The heart is unremarkable. Mediastinum/Nodes: The patient has a pectus deformity. No adenopathy. No effusion. Lungs/Pleura: Central airways are normal. No pneumothorax. Scattered nodularity in the lungs, right greater than left, particularly in the right middle lobe and posteriorly in the right lower lobe. A representative nodule in the right middle lobe measures 7.5 mm. No myalgias is. No other infiltrates. Musculoskeletal: See below. CT ABDOMEN PELVIS FINDINGS Hepatobiliary: No focal liver abnormality is seen. Status post cholecystectomy. No biliary dilatation. Pancreas: Unremarkable. No pancreatic ductal dilatation or surrounding inflammatory changes. Spleen: Normal in size without focal abnormality. Adrenals/Urinary Tract: Adrenal glands are unremarkable. Kidneys are normal, without renal calculi, focal lesion, or hydronephrosis. Bladder is unremarkable. Stomach/Bowel: A duodenal diverticulum is identified without evidence of inflammation. The stomach and small bowel are otherwise normal. Colonic diverticulosis is identified without convincing evidence of diverticulitis. The remainder of the colon is unremarkable. Status post appendectomy. Vascular/Lymphatic: Dense atherosclerotic change in the nonaneurysmal abdominal  aorta. Dense atherosclerotic change at the origin of the left renal artery. No adenopathy. Reproductive: The patient may be status post hysterectomy. Recommend clinical correlation. Other: No abdominal wall hernia or abnormality. No abdominopelvic ascites. Musculoskeletal: Degenerative changes in the hips. Degenerative changes throughout the spine. Pedicle rods and screws seen at L3 and L4, in good position. No acute spinal fracture noted. IMPRESSION: 1. Nodularity in the right lower lobe and right middle lobe primarily with a few nodules on the left. I suspect an infectious process such as MAI. The findings however are not definitive. A representative nodule in the right middle lobe measures 8 mm. Non-contrast chest CT at 3-6 months is recommended. If the nodules are stable at time of repeat CT, then future CT at 18-24 months (from today's scan) is considered optional for low-risk patients, but is recommended for high-risk patients. This recommendation  follows the consensus statement: Guidelines for Management of Incidental Pulmonary Nodules Detected on CT Images: From the Fleischner Society 2017; Radiology 2017; 284:228-243. 2. Degenerative changes in the spine. No other cause for the patient's symptoms identified. 3. Atherosclerotic changes in the thoracic and abdominal aorta without aneurysm. 4. Coronary artery calcifications. Electronically Signed   By: Dorise Bullion III M.D   On: 09/30/2018 22:32   Ct Chest Wo Contrast  Result Date: 09/30/2018 CLINICAL DATA:  Chest pain since yesterday. Intermittent relief with nitroglycerin. Shortness of breath and diaphoresis. EXAM: CT CHEST, ABDOMEN AND PELVIS WITHOUT CONTRAST TECHNIQUE: Multidetector CT imaging of the chest, abdomen and pelvis was performed following the standard protocol without IV contrast. COMPARISON:  Chest x-ray September 30, 2018 FINDINGS: CT CHEST FINDINGS Cardiovascular: The thoracic aorta demonstrates atherosclerotic change without aneurysm. The  fat around the thoracic aorta is normal. No evidence of leak. Dissection can not be assessed without contrast. The central pulmonary arteries are normal in caliber. Coronary artery calcifications involve the LAD and circumflex artery primarily. The heart is unremarkable. Mediastinum/Nodes: The patient has a pectus deformity. No adenopathy. No effusion. Lungs/Pleura: Central airways are normal. No pneumothorax. Scattered nodularity in the lungs, right greater than left, particularly in the right middle lobe and posteriorly in the right lower lobe. A representative nodule in the right middle lobe measures 7.5 mm. No myalgias is. No other infiltrates. Musculoskeletal: See below. CT ABDOMEN PELVIS FINDINGS Hepatobiliary: No focal liver abnormality is seen. Status post cholecystectomy. No biliary dilatation. Pancreas: Unremarkable. No pancreatic ductal dilatation or surrounding inflammatory changes. Spleen: Normal in size without focal abnormality. Adrenals/Urinary Tract: Adrenal glands are unremarkable. Kidneys are normal, without renal calculi, focal lesion, or hydronephrosis. Bladder is unremarkable. Stomach/Bowel: A duodenal diverticulum is identified without evidence of inflammation. The stomach and small bowel are otherwise normal. Colonic diverticulosis is identified without convincing evidence of diverticulitis. The remainder of the colon is unremarkable. Status post appendectomy. Vascular/Lymphatic: Dense atherosclerotic change in the nonaneurysmal abdominal aorta. Dense atherosclerotic change at the origin of the left renal artery. No adenopathy. Reproductive: The patient may be status post hysterectomy. Recommend clinical correlation. Other: No abdominal wall hernia or abnormality. No abdominopelvic ascites. Musculoskeletal: Degenerative changes in the hips. Degenerative changes throughout the spine. Pedicle rods and screws seen at L3 and L4, in good position. No acute spinal fracture noted. IMPRESSION: 1.  Nodularity in the right lower lobe and right middle lobe primarily with a few nodules on the left. I suspect an infectious process such as MAI. The findings however are not definitive. A representative nodule in the right middle lobe measures 8 mm. Non-contrast chest CT at 3-6 months is recommended. If the nodules are stable at time of repeat CT, then future CT at 18-24 months (from today's scan) is considered optional for low-risk patients, but is recommended for high-risk patients. This recommendation follows the consensus statement: Guidelines for Management of Incidental Pulmonary Nodules Detected on CT Images: From the Fleischner Society 2017; Radiology 2017; 284:228-243. 2. Degenerative changes in the spine. No other cause for the patient's symptoms identified. 3. Atherosclerotic changes in the thoracic and abdominal aorta without aneurysm. 4. Coronary artery calcifications. Electronically Signed   By: Dorise Bullion III M.D   On: 09/30/2018 22:32   Dg Chest Port 1 View  Result Date: 09/30/2018 CLINICAL DATA:  Chest pain since yesterday. EXAM: PORTABLE CHEST 1 VIEW COMPARISON:  June 26, 2017 FINDINGS: The heart size and mediastinal contours are within normal limits. Both lungs are clear.  The visualized skeletal structures are unremarkable. IMPRESSION: No active disease. Electronically Signed   By: Dorise Bullion III M.D   On: 09/30/2018 20:22    Cardiac Studies     Patient Profile     85 y.o. female who was admitted with several days of chest discomfort.  Initially her troponin levels were only minimally elevated.  This morning she developed intensification of her pain associated with ST segment elevation in the inferior leads.  Assessment & Plan    1.  Inferior wall ST segment elevation myocardial infarction. Had extensive discussion with her daughter-in-law, Almyra Free, and her son [name].  I think her best option is to proceed with conservative, medical management.  She is quite frail and  has significant renal insufficiency.  I think that her complication rate with urgent heart catheterization would be quite high.  In addition, it appears that she is already had an anterior wall myocardial infarction on top of this new inferior wall myocardial infarction.  I suspect that she will have multivessel disease and that our options for PCI will be very limited.  She is already feeling better with the morphine, nitroglycerin, heparin. She is a DNR. We will do all that we can to keep her comfortable through this myocardial infarction.      For questions or updates, please contact [place] Please consult [email or web address] for contact info under        Signed, Mertie Moores, MD  10/01/2018, 8:42 AM

## 2018-10-01 NOTE — Progress Notes (Addendum)
PROGRESS NOTE    KARINGTON ZARAZUA  IEP:[id number] DOB: 1923/11/15 DOA: 09/30/2018 PCP: Jinny Sanders, MD   Brief Narrative:  Per HPI TAMEYA KUZNIA is Arilyn Brierley 85 y.o. female with medical history significant of hypertension, hyperlipidemia, CKD stage IV, OSA, GERD, type 2 diabetes, paroxysmal atrial fibrillation, prior TIA/CVA, anxiety, history of esophageal stricture status post dilation and hiatal hernia on PPI presenting to the hospital for evaluation of chest pain. Patient states her chest pain started 2/29 at 5 AM and woke her up from her sleep.  She describes it as sharp right-sided chest pain which radiates to her right shoulder and right arm.  Associated with shortness of breath.  No nausea or diaphoresis.  2 hours later she spoke to her son over the phone who advised her to take sublingual nitroglycerin.  Her chest pain improved Osiris Odriscoll little with sublingual nitroglycerin but persisted the entire day.  She took additional nitroglycerin around dinnertime which helped Dajai Wahlert little but again the chest pain persisted.  The following day she continued to have chest pain and took multiple doses of sublingual nitroglycerin with no improvement.  Patient received another dose of sublingual nitroglycerin in the ED but continues to have chest pain.  She is Ervin Hensley never smoker.  Patient thinks her chest pain might be indigestion. Family at bedside states that patient has Aviyanna Colbaugh history of esophageal stricture and several dilations done in the past.  Unclear why patient has sublingual nitroglycerin at home as family states she does not have any history of coronary artery disease.    Assessment & Plan:   Principal Problem:   Chest pain Active Problems:   Hyperkalemia   Hypertensive urgency   Abdominal tenderness   Dysuria   Hyperglycemia   STEMI - AM of 3/2, pt had chest pain and EKG changes with ST elevation in II and aVF with depression in I, aVL. - Troponin uptrending to 0.18 - Aspirin, heparin gtt - nitro  gtt, prn morphine  - hold on beta blocker for now - follow echocardioram - Appreciate cardiology assistance from Dr. Acie Fredrickson.  After discussion of risks/benefits, decision against catheterization.  Plan for medical management.   Hypertensive urgency Blood pressure significantly elevated on arrival (205/69).   Improved today to 150's.   -Continue to monitor -hydralazine prn  Abdominal tenderness - minimal on my exam this AM - CT abdomen/pelvis without acute findings - UA pending, LFT's and lipase wnl on presentation  ?MAI infection - will plan to discuss with ID -CT chest showing nodularity in the right lower lobe and right middle lobe primarily with Aleen Marston few nodules on the left, suspected to be an infectious process such as MAI, however, findings not definitive.  There is also Mahari Strahm nodule in the right middle lobe measuring 8 mm. -Patient denies any fevers, night sweats, weight loss, or malaise to admitting physician  -White count borderline elevated at 11.5.  Patient is afebrile and nontoxic-appearing. -Received ceftriaxone and azithromycin in the ED.  Hold further abx. -Patient will need repeat noncontrast CT in 3 months to ensure resolution. - follow blood cultures  Dysuria -Patient reports dysuria.  Afebrile but white count borderline elevated. -Check UA  Mild hyperkalemia - resolved  Hyperglycemia, history of type 2 diabetes -Improved BG this AM -Continue home Lantus 36 units at bedtime (takes 42 units at home) - hold mealtime insulin while NPO - q4 SSI while npo  CKD stage IV -Stable.  Creatinine 1.8 today. -Continue monitor renal function  Will discuss  med rec with pharmacy to ensure this is complete.   DVT prophylaxis: heparin gtt Code Status: DNR Family Communication: daughter in law/son at bedside Disposition Plan: pending treatment of STEMI, s/o by cardiology 50 min critical care time due to STEMI  Consultants:   Cardiology  Procedures:    none  Antimicrobials:  Anti-infectives (From admission, onward)   Start     Dose/Rate Route Frequency Ordered Stop   09/30/18 2345  cefTRIAXone (ROCEPHIN) 1 g in sodium chloride 0.9 % 100 mL IVPB     1 g 200 mL/hr over 30 Minutes Intravenous  Once 09/30/18 2337 10/01/18 0059   09/30/18 2345  azithromycin (ZITHROMAX) 500 mg in sodium chloride 0.9 % 250 mL IVPB     500 mg 250 mL/hr over 60 Minutes Intravenous  Once 09/30/18 2337 10/01/18 0159     Subjective: C/o chest pain. Improving. Pt has STEMI on EKG. Present during conversation with Dr. Acie Fredrickson, daughter in law, and son.  Objective: Vitals:   09/30/18 2348 10/01/18 0100 10/01/18 0207 10/01/18 0701  BP:  (!) 161/98 (!) 156/54 (!) 152/55  Pulse: 66 64 66 62  Resp: (!) 24 (!) 29 (!) 22 17  Temp: 98.4 F (36.9 C)  97.6 F (36.4 C) 97.9 F (36.6 C)  TempSrc: Rectal  Axillary Oral  SpO2: 92% 94% 96% 99%  Weight:   59.6 kg   Height:   5\' 3"  (1.6 m)     Intake/Output Summary (Last 24 hours) at 10/01/2018 0835 Last data filed at 10/01/2018 0100 Gross per 24 hour  Intake -  Output 400 ml  Net -400 ml   Filed Weights   09/30/18 1913 10/01/18 0207  Weight: 61.7 kg 59.6 kg    Examination:  General exam: Appears calm and comfortable  Respiratory system: Clear to auscultation. Respiratory effort normal. Cardiovascular system: S1 & S2 heard, RRR. Gastrointestinal system: Abdomen is nondistended, soft and nontender. Central nervous system: Alert and oriented. No focal neurological deficits. Extremities: Symmetric 5 x 5 power. Skin: No rashes, lesions or ulcers Psychiatry: Judgement and insight appear normal. Mood & affect appropriate.     Data Reviewed: I have personally reviewed following labs and imaging studies  CBC: Recent Labs  Lab 09/30/18 1928 10/01/18 0643  WBC 11.5* 11.4*  NEUTROABS 8.3*  --   HGB 12.9 11.6*  HCT 41.8 36.9  MCV 99.1 96.9  PLT 257 330   Basic Metabolic Panel: Recent Labs  Lab  09/30/18 1928 10/01/18 0643  NA 129* 139  K 5.4* 4.8  CL 93* 105  CO2 23 23  GLUCOSE 328* 163*  BUN 51* 43*  CREATININE 2.16* 1.80*  CALCIUM 8.2* 8.1*   GFR: Estimated Creatinine Clearance: 16.5 mL/min (Lajarvis Italiano) (by C-G formula based on SCr of 1.8 mg/dL (H)). Liver Function Tests: Recent Labs  Lab 09/30/18 1928  AST 26  ALT 24  ALKPHOS 131*  BILITOT 0.3  PROT 7.3  ALBUMIN 4.3   Recent Labs  Lab 09/30/18 1928  LIPASE 20   No results for input(s): AMMONIA in the last 168 hours. Coagulation Profile: Recent Labs  Lab 09/30/18 1928  INR 1.0   Cardiac Enzymes: Recent Labs  Lab 09/30/18 1928 10/01/18 0217 10/01/18 0643  TROPONINI 0.06* 0.09* 0.18*   BNP (last 3 results) No results for input(s): PROBNP in the last 8760 hours. HbA1C: No results for input(s): HGBA1C in the last 72 hours. CBG: Recent Labs  Lab 10/01/18 0245 10/01/18 0804  GLUCAP 311* 137*  Lipid Profile: No results for input(s): CHOL, HDL, LDLCALC, TRIG, CHOLHDL, LDLDIRECT in the last 72 hours. Thyroid Function Tests: No results for input(s): TSH, T4TOTAL, FREET4, T3FREE, THYROIDAB in the last 72 hours. Anemia Panel: No results for input(s): VITAMINB12, FOLATE, FERRITIN, TIBC, IRON, RETICCTPCT in the last 72 hours. Sepsis Labs: No results for input(s): PROCALCITON, LATICACIDVEN in the last 168 hours.  Recent Results (from the past 240 hour(s))  Blood culture (routine x 2)     Status: None (Preliminary result)   Collection Time: 09/30/18 11:45 PM  Result Value Ref Range Status   Specimen Description BLOOD RIGHT ARM  Final   Special Requests   Final    BOTTLES DRAWN AEROBIC AND ANAEROBIC Blood Culture adequate volume   Culture NO GROWTH < 12 HOURS  Final   Report Status PENDING  Incomplete  Blood culture (routine x 2)     Status: None (Preliminary result)   Collection Time: 09/30/18 11:55 PM  Result Value Ref Range Status   Specimen Description BLOOD LEFT WRIST  Final   Special Requests    Final    BOTTLES DRAWN AEROBIC AND ANAEROBIC Blood Culture adequate volume   Culture NO GROWTH < 12 HOURS  Final   Report Status PENDING  Incomplete         Radiology Studies: Ct Abdomen Pelvis Wo Contrast  Result Date: 09/30/2018 CLINICAL DATA:  Chest pain since yesterday. Intermittent relief with nitroglycerin. Shortness of breath and diaphoresis. EXAM: CT CHEST, ABDOMEN AND PELVIS WITHOUT CONTRAST TECHNIQUE: Multidetector CT imaging of the chest, abdomen and pelvis was performed following the standard protocol without IV contrast. COMPARISON:  Chest x-ray September 30, 2018 FINDINGS: CT CHEST FINDINGS Cardiovascular: The thoracic aorta demonstrates atherosclerotic change without aneurysm. The fat around the thoracic aorta is normal. No evidence of leak. Dissection can not be assessed without contrast. The central pulmonary arteries are normal in caliber. Coronary artery calcifications involve the LAD and circumflex artery primarily. The heart is unremarkable. Mediastinum/Nodes: The patient has Shaylah Mcghie pectus deformity. No adenopathy. No effusion. Lungs/Pleura: Central airways are normal. No pneumothorax. Scattered nodularity in the lungs, right greater than left, particularly in the right middle lobe and posteriorly in the right lower lobe. Jessiah Steinhart representative nodule in the right middle lobe measures 7.5 mm. No myalgias is. No other infiltrates. Musculoskeletal: See below. CT ABDOMEN PELVIS FINDINGS Hepatobiliary: No focal liver abnormality is seen. Status post cholecystectomy. No biliary dilatation. Pancreas: Unremarkable. No pancreatic ductal dilatation or surrounding inflammatory changes. Spleen: Normal in size without focal abnormality. Adrenals/Urinary Tract: Adrenal glands are unremarkable. Kidneys are normal, without renal calculi, focal lesion, or hydronephrosis. Bladder is unremarkable. Stomach/Bowel: Steffen Hase duodenal diverticulum is identified without evidence of inflammation. The stomach and small bowel are  otherwise normal. Colonic diverticulosis is identified without convincing evidence of diverticulitis. The remainder of the colon is unremarkable. Status post appendectomy. Vascular/Lymphatic: Dense atherosclerotic change in the nonaneurysmal abdominal aorta. Dense atherosclerotic change at the origin of the left renal artery. No adenopathy. Reproductive: The patient may be status post hysterectomy. Recommend clinical correlation. Other: No abdominal wall hernia or abnormality. No abdominopelvic ascites. Musculoskeletal: Degenerative changes in the hips. Degenerative changes throughout the spine. Pedicle rods and screws seen at L3 and L4, in good position. No acute spinal fracture noted. IMPRESSION: 1. Nodularity in the right lower lobe and right middle lobe primarily with Gabryella Murfin few nodules on the left. I suspect an infectious process such as MAI. The findings however are not definitive. Maribell Demeo representative nodule in  the right middle lobe measures 8 mm. Non-contrast chest CT at 3-6 months is recommended. If the nodules are stable at time of repeat CT, then future CT at 18-24 months (from today's scan) is considered optional for low-risk patients, but is recommended for high-risk patients. This recommendation follows the consensus statement: Guidelines for Management of Incidental Pulmonary Nodules Detected on CT Images: From the Fleischner Society 2017; Radiology 2017; 284:228-243. 2. Degenerative changes in the spine. No other cause for the patient's symptoms identified. 3. Atherosclerotic changes in the thoracic and abdominal aorta without aneurysm. 4. Coronary artery calcifications. Electronically Signed   By: Dorise Bullion III M.D   On: 09/30/2018 22:32   Ct Chest Wo Contrast  Result Date: 09/30/2018 CLINICAL DATA:  Chest pain since yesterday. Intermittent relief with nitroglycerin. Shortness of breath and diaphoresis. EXAM: CT CHEST, ABDOMEN AND PELVIS WITHOUT CONTRAST TECHNIQUE: Multidetector CT imaging of the  chest, abdomen and pelvis was performed following the standard protocol without IV contrast. COMPARISON:  Chest x-ray September 30, 2018 FINDINGS: CT CHEST FINDINGS Cardiovascular: The thoracic aorta demonstrates atherosclerotic change without aneurysm. The fat around the thoracic aorta is normal. No evidence of leak. Dissection can not be assessed without contrast. The central pulmonary arteries are normal in caliber. Coronary artery calcifications involve the LAD and circumflex artery primarily. The heart is unremarkable. Mediastinum/Nodes: The patient has Kareena Arrambide pectus deformity. No adenopathy. No effusion. Lungs/Pleura: Central airways are normal. No pneumothorax. Scattered nodularity in the lungs, right greater than left, particularly in the right middle lobe and posteriorly in the right lower lobe. Magdiel Bartles representative nodule in the right middle lobe measures 7.5 mm. No myalgias is. No other infiltrates. Musculoskeletal: See below. CT ABDOMEN PELVIS FINDINGS Hepatobiliary: No focal liver abnormality is seen. Status post cholecystectomy. No biliary dilatation. Pancreas: Unremarkable. No pancreatic ductal dilatation or surrounding inflammatory changes. Spleen: Normal in size without focal abnormality. Adrenals/Urinary Tract: Adrenal glands are unremarkable. Kidneys are normal, without renal calculi, focal lesion, or hydronephrosis. Bladder is unremarkable. Stomach/Bowel: Preslee Regas duodenal diverticulum is identified without evidence of inflammation. The stomach and small bowel are otherwise normal. Colonic diverticulosis is identified without convincing evidence of diverticulitis. The remainder of the colon is unremarkable. Status post appendectomy. Vascular/Lymphatic: Dense atherosclerotic change in the nonaneurysmal abdominal aorta. Dense atherosclerotic change at the origin of the left renal artery. No adenopathy. Reproductive: The patient may be status post hysterectomy. Recommend clinical correlation. Other: No abdominal wall  hernia or abnormality. No abdominopelvic ascites. Musculoskeletal: Degenerative changes in the hips. Degenerative changes throughout the spine. Pedicle rods and screws seen at L3 and L4, in good position. No acute spinal fracture noted. IMPRESSION: 1. Nodularity in the right lower lobe and right middle lobe primarily with Shantai Tiedeman few nodules on the left. I suspect an infectious process such as MAI. The findings however are not definitive. Sharon Rubis representative nodule in the right middle lobe measures 8 mm. Non-contrast chest CT at 3-6 months is recommended. If the nodules are stable at time of repeat CT, then future CT at 18-24 months (from today's scan) is considered optional for low-risk patients, but is recommended for high-risk patients. This recommendation follows the consensus statement: Guidelines for Management of Incidental Pulmonary Nodules Detected on CT Images: From the Fleischner Society 2017; Radiology 2017; 284:228-243. 2. Degenerative changes in the spine. No other cause for the patient's symptoms identified. 3. Atherosclerotic changes in the thoracic and abdominal aorta without aneurysm. 4. Coronary artery calcifications. Electronically Signed   By: Dorise Bullion III M.D  On: 09/30/2018 22:32   Dg Chest Port 1 View  Result Date: 09/30/2018 CLINICAL DATA:  Chest pain since yesterday. EXAM: PORTABLE CHEST 1 VIEW COMPARISON:  June 26, 2017 FINDINGS: The heart size and mediastinal contours are within normal limits. Both lungs are clear. The visualized skeletal structures are unremarkable. IMPRESSION: No active disease. Electronically Signed   By: Dorise Bullion III M.D   On: 09/30/2018 20:22        Scheduled Meds: . aspirin  324 mg Oral STAT  . heparin  3,500 Units Intravenous Once  . insulin aspart  0-9 Units Subcutaneous TID WC  . insulin aspart  10 Units Subcutaneous Q breakfast   And  . insulin aspart  16 Units Subcutaneous BID AC  . insulin aspart  15 Units Subcutaneous Once  .  insulin glargine  36 Units Subcutaneous QHS  . nitroGLYCERIN       Continuous Infusions: . sodium chloride 100 mL/hr at 10/01/18 0310  . heparin    . nitroGLYCERIN       LOS: 0 days    Time spent: over 30 min    Fayrene Helper, MD [facility] Pager AMION  If 7PM-7AM, please contact night-coverage [email or web address] Password [facility] 10/01/2018, 8:35 AM

## 2018-10-01 NOTE — Progress Notes (Signed)
ANTICOAGULATION CONSULT NOTE - Initial Consult  Pharmacy Consult for heparin Indication: chest pain/ACS  Allergies  Allergen Reactions  . Celebrex [Celecoxib] Rash  . Cymbalta [Duloxetine Hcl] Rash    Lip numbness,rash, leg and body jerks. Patient states " I though I was having a heart attack or stroke."   . Doxycycline Rash  . Lyrica [Pregabalin] Rash  . Avandia [Rosiglitazone]   . Macrodantin [Nitrofurantoin Macrocrystal]   . Sulfa Antibiotics   . Vioxx [Rofecoxib]   . Silicone Rash    Gets rash from the touch it.    Patient Measurements: Height: 5\' 3"  (160 cm) Weight: 131 lb 4.8 oz (59.6 kg) IBW/kg (Calculated) : 52.4 Heparin Dosing Weight: 60kg  Vital Signs: Temp: 97.9 F (36.6 C) (03/02 0701) Temp Source: Oral (03/02 0701) BP: 152/55 (03/02 0701) Pulse Rate: 62 (03/02 0701)  Labs: Recent Labs    09/30/18 1928 10/01/18 0217 10/01/18 0643  HGB 12.9  --  11.6*  HCT 41.8  --  36.9  PLT 257  --  215  LABPROT 13.3  --   --   INR 1.0  --   --   CREATININE 2.16*  --  1.80*  TROPONINI 0.06* 0.09* 0.18*    Estimated Creatinine Clearance: 16.5 mL/min (A) (by C-G formula based on SCr of 1.8 mg/dL (H)).   Medical History: Past Medical History:  Diagnosis Date  . A-fib ([place])   . Abnormal involuntary movements(781.0)   . Anemia   . Anxiety   . Anxiety state, unspecified   . Arthritis    body, neck  . Benign hypertensive heart disease   . Benign hypertensive heart disease with congestive heart failure ([place])   . Bladder disorder    over active  . Cancer ([place])    skin, removed  . Cervicalgia   . CHF (congestive heart failure) ([place])   . Chronic kidney disease, stage III (moderate) (HCC)   . Congestive heart failure, unspecified   . Contact dermatitis and other eczema, due to unspecified cause   . Corns and callosities   . Diabetes mellitus without complication ([place])   . Diarrhea   . Dyspnea 02/18/2015  . Edema   . Esophageal stricture   . Gastroparesis    . Gastroparesis   . GERD (gastroesophageal reflux disease)   . Hiatal hernia   . Hyperlipidemia   . Inflamed seborrheic keratosis   . Inflammatory disease of breast   . Irregular heartbeat   . Lumbago   . Mixed incontinence urge and stress (female)(female)   . Nausea alone   . Nonspecific (abnormal) findings on radiological and other examination of skull and head   . Osteoarthrosis, unspecified whether generalized or localized, unspecified site   . Other and unspecified hyperlipidemia   . Other B-complex deficiencies   . Other functional disorder of bladder   . Other malaise and fatigue   . Other specified visual disturbances   . Pain in joint, lower leg   . Palpitations   . Peripheral vascular disease, unspecified ([place])   . Postmenopausal atrophic vaginitis   . Reflux esophagitis   . Seborrheic keratosis   . Sleep apnea   . Spasm of muscle   . Spinal stenosis   . Spinal stenosis, unspecified region other than cervical   . Stricture and stenosis of esophagus   . Stroke ([place])   . TIA (transient ischemic attack) 2012  . Transient ischemic attack (TIA), and cerebral infarction without residual deficits(V12.54)   . Type  II or unspecified type diabetes mellitus with renal manifestations, not stated as uncontrolled(250.40)   . Unspecified constipation   . Unspecified essential hypertension   . Unspecified hereditary and idiopathic peripheral neuropathy   . Unspecified hypothyroidism   . Unspecified pruritic disorder   . Unspecified sleep apnea   . Unspecified vitamin D deficiency   . Urinary tract infection, site not specified   . Vitamin B12 deficiency    Assessment: 85 year old female with chest pain and cardiac enzymes trending up. Orders to start IV heparin. Patient does not appear to be on anticoagulation prior to admit. CBC ok.   Goal of Therapy:  Heparin level 0.3-0.7 units/ml Monitor platelets by anticoagulation protocol: Yes   Plan:  Give 3500 units bolus x  1 Start heparin infusion at 750 units/hr Check anti-Xa level in 8 hours and daily while on heparin Continue to monitor H&H and platelets  Erin Hearing PharmD., BCPS Clinical Pharmacist 10/01/2018 8:32 AM

## 2018-10-01 NOTE — Progress Notes (Signed)
Pt c/o chest pain at shift change. 10/10 on pain scale. Pt given 2 ntg SL. BP initially at 182/70, BP decreased to 105/48 after 2 ntg sl. Morphine sulfate 2 mg ordered and given. Pt on 2 LPM o2 per nasal cannula. Dr. Florene Glen came to see pt. Cardiology on the way to see pt.

## 2018-10-01 NOTE — Progress Notes (Signed)
Echocardiogram 2D Echocardiogram has been performed.  10/01/2018 11:33 AM Maudry Mayhew, MHA, RVT, RDCS, RDMS

## 2018-10-01 NOTE — Progress Notes (Addendum)
   Morphine Sulfate 2 mg IV given. C/O SOB and ongoing chest pain.Lung sounds fine crackles on R mid to lower lung. L lung base fine crackles.  Updated Dr. Florene Glen . NS turned off per MD . Will continue to monitor. Pt for PCXray  And result to be called to Dr. Florene Glen.

## 2018-10-02 ENCOUNTER — Other Ambulatory Visit (HOSPITAL_COMMUNITY): Payer: Self-pay

## 2018-10-02 LAB — CBC
HCT: 35.9 % — ABNORMAL LOW (ref 36.0–46.0)
Hemoglobin: 11.1 g/dL — ABNORMAL LOW (ref 12.0–15.0)
MCH: 30.4 pg (ref 26.0–34.0)
MCHC: 30.9 g/dL (ref 30.0–36.0)
MCV: 98.4 fL (ref 80.0–100.0)
PLATELETS: 222 10*3/uL (ref 150–400)
RBC: 3.65 MIL/uL — ABNORMAL LOW (ref 3.87–5.11)
RDW: 12.5 % (ref 11.5–15.5)
WBC: 12.9 10*3/uL — ABNORMAL HIGH (ref 4.0–10.5)
nRBC: 0 % (ref 0.0–0.2)

## 2018-10-02 LAB — BASIC METABOLIC PANEL
Anion gap: 13 (ref 5–15)
BUN: 30 mg/dL — ABNORMAL HIGH (ref 8–23)
CO2: 23 mmol/L (ref 22–32)
Calcium: 8.3 mg/dL — ABNORMAL LOW (ref 8.9–10.3)
Chloride: 104 mmol/L (ref 98–111)
Creatinine, Ser: 1.49 mg/dL — ABNORMAL HIGH (ref 0.44–1.00)
GFR calc Af Amer: 35 mL/min — ABNORMAL LOW (ref >60)
GFR calc non Af Amer: 30 mL/min — ABNORMAL LOW (ref >60)
GLUCOSE: 179 mg/dL — AB (ref 70–99)
Potassium: 4.8 mmol/L (ref 3.5–5.1)
Sodium: 140 mmol/L (ref 135–145)

## 2018-10-02 LAB — GLUCOSE, CAPILLARY
Glucose-Capillary: 175 mg/dL — ABNORMAL HIGH (ref 70–99)
Glucose-Capillary: 133 mg/dL — ABNORMAL HIGH (ref 70–99)
Glucose-Capillary: 193 mg/dL — ABNORMAL HIGH (ref 70–99)
Glucose-Capillary: 256 mg/dL — ABNORMAL HIGH (ref 70–99)
Glucose-Capillary: 286 mg/dL — ABNORMAL HIGH (ref 70–99)

## 2018-10-02 LAB — TROPONIN I
TROPONIN I: 2.7 ng/mL — AB (ref <0.03)
Troponin I: 2.85 ng/mL (ref <0.03)

## 2018-10-02 LAB — HEPARIN LEVEL (UNFRACTIONATED): Heparin Unfractionated: 0.47 IU/mL (ref 0.30–0.70)

## 2018-10-02 MED ORDER — MORPHINE SULFATE (PF) 2 MG/ML IV SOLN: 2.0000 mg | INTRAVENOUS | Status: DC | PRN

## 2018-10-02 MED ORDER — POLYETHYLENE GLYCOL 3350 17 G PO PACK
17.0000 g | PACK | Freq: Every day | ORAL | Status: DC
  Administered 2018-10-02 – 2018-10-08 (×7): 17 g via ORAL
  Filled 2018-10-02 (×7): qty 1

## 2018-10-02 MED ORDER — INSULIN ASPART 100 UNIT/ML ~~LOC~~ SOLN
0.0000 [IU] | Freq: Three times a day (TID) | SUBCUTANEOUS | Status: DC
  Administered 2018-10-02: 2 [IU] via SUBCUTANEOUS
  Administered 2018-10-02: 5 [IU] via SUBCUTANEOUS
  Administered 2018-10-03: 2 [IU] via SUBCUTANEOUS

## 2018-10-02 MED ORDER — INSULIN ASPART 100 UNIT/ML ~~LOC~~ SOLN
0.0000 [IU] | Freq: Every day | SUBCUTANEOUS | Status: DC
  Administered 2018-10-02 – 2018-10-06 (×5): 3 [IU] via SUBCUTANEOUS
  Administered 2018-10-07: 2 [IU] via SUBCUTANEOUS

## 2018-10-02 MED ORDER — MORPHINE SULFATE (PF) 4 MG/ML IV SOLN
4.0000 mg | INTRAVENOUS | Status: DC | PRN
  Administered 2018-10-02 (×3): 2 mg via INTRAVENOUS
  Filled 2018-10-02 (×4): qty 1

## 2018-10-02 MED ORDER — MORPHINE SULFATE (PF) 2 MG/ML IV SOLN
2.0000 mg | INTRAVENOUS | Status: DC | PRN
  Administered 2018-10-03 – 2018-10-07 (×8): 2 mg via INTRAVENOUS
  Filled 2018-10-02 (×10): qty 1

## 2018-10-02 MED ORDER — PANTOPRAZOLE SODIUM 40 MG PO TBEC
40.0000 mg | DELAYED_RELEASE_TABLET | Freq: Every day | ORAL | Status: DC
  Administered 2018-10-02 – 2018-10-08 (×7): 40 mg via ORAL
  Filled 2018-10-02 (×7): qty 1

## 2018-10-02 MED ORDER — ALPRAZOLAM 0.5 MG PO TABS
1.0000 mg | ORAL_TABLET | Freq: Every day | ORAL | Status: DC
  Administered 2018-10-02 – 2018-10-07 (×6): 1 mg via ORAL
  Filled 2018-10-02 (×7): qty 2

## 2018-10-02 MED ORDER — GABAPENTIN 100 MG PO CAPS
200.0000 mg | ORAL_CAPSULE | Freq: Every day | ORAL | Status: DC
  Administered 2018-10-02 – 2018-10-07 (×6): 200 mg via ORAL
  Filled 2018-10-02 (×7): qty 2

## 2018-10-02 MED ORDER — METOPROLOL TARTRATE 5 MG/5ML IV SOLN
5.0000 mg | Freq: Once | INTRAVENOUS | Status: AC
  Administered 2018-10-02: 5 mg via INTRAVENOUS

## 2018-10-02 MED ORDER — METOPROLOL TARTRATE 25 MG PO TABS: 25.0000 mg | ORAL_TABLET | Freq: Two times a day (BID) | ORAL | Status: DC

## 2018-10-02 MED ORDER — METOPROLOL TARTRATE 5 MG/5ML IV SOLN
INTRAVENOUS | Status: AC
  Filled 2018-10-02: qty 5

## 2018-10-02 MED ORDER — METOPROLOL TARTRATE 50 MG PO TABS
50.0000 mg | ORAL_TABLET | Freq: Two times a day (BID) | ORAL | Status: DC
  Administered 2018-10-02 (×2): 50 mg via ORAL
  Filled 2018-10-02 (×3): qty 1

## 2018-10-02 MED ORDER — ALPRAZOLAM 0.5 MG PO TABS
0.5000 mg | ORAL_TABLET | Freq: Three times a day (TID) | ORAL | Status: DC | PRN
  Administered 2018-10-02: 1 mg via ORAL
  Administered 2018-10-02 – 2018-10-08 (×12): 0.5 mg via ORAL
  Filled 2018-10-02 (×12): qty 1

## 2018-10-02 MED ORDER — LEVOTHYROXINE SODIUM 25 MCG PO TABS
125.0000 ug | ORAL_TABLET | Freq: Every day | ORAL | Status: DC
  Administered 2018-10-02 – 2018-10-08 (×7): 125 ug via ORAL
  Filled 2018-10-02 (×7): qty 1

## 2018-10-02 NOTE — Care Management Note (Addendum)
Case Management Note  Patient Details  Name: Karen [name] MRN: [id number] Date of Birth: October 18, 1923  Subjective/Objective:  Pt presented fro Chest Pain. Continues on IV Nitro gtt. Family at the bedside.  Pt has been at [facility] in the past-per family optimal is for patient to return home. Patient has used [facility] in the past. PTA from home alone, has life alert button and family does not live to far away. Family is working with [facility] regarding Meals on Wheels and Aging and Adult services for Personal care services in the home.                 Action/Plan: CM did speak with family about Palliative Care and Goals of Care-Family states MD had discussed. Once patient is able-she will need PT consult for recommendations. CM will continue to monitor.   Expected Discharge Date:                  Expected Discharge Plan:  [facility]  In-House Referral:  [facility]  Discharge planning Services  CM Consult  Post Acute Care Choice:   N/A Choice offered to:   N/A  DME Arranged:   N/A DME Agency:   N/A  HH Arranged:   N/A HH Agency:   N/A  Status of Service:  Completed If discussed at [place] of Stay Meetings, dates discussed:    Additional Comments: 1116 10-04-18 Jacqlyn Krauss, RN, BSN Case Manager [phone] CSW spoke with patient and she has decided to go to SNF. No further needs from CM at this time.  Bethena Roys, RN 10/02/2018, 2:43 PM

## 2018-10-02 NOTE — Progress Notes (Addendum)
Progress Note  Patient Name: Karen [name] Date of Encounter: 10/02/2018  Primary Cardiologist: Mertie Moores, MD   Subjective   Chest pain has returned this morning. No improvement with morphine. SBPs in the 180s. RN just increased dose of IV nitro prior to my exam. Her son and daughter in law are present at bedside. She remains DNR. She has also felt anxious. Family requesting if her home alprazolam can be resumed as well as her home Prilosec.   Inpatient Medications    Scheduled Meds: . aspirin EC  81 mg Oral Daily  . atorvastatin  10 mg Oral q1800  . insulin aspart  0-9 Units Subcutaneous Q4H  . insulin aspart  15 Units Subcutaneous Once  . insulin glargine  36 Units Subcutaneous QHS  . levothyroxine  125 mcg Oral Q0600   Continuous Infusions: . heparin 750 Units/hr (10/01/18 2000)  . nitroGLYCERIN 35 mcg/min (10/02/18 0845)   PRN Meds: acetaminophen, hydrALAZINE, morphine injection, ondansetron (ZOFRAN) IV, traMADol   Vital Signs    Vitals:   10/01/18 1347 10/01/18 2120 10/02/18 0024 10/02/18 0440  BP: (!) 156/61 (!) 163/63 (!) 112/94 139/62  Pulse:  76 70 78  Resp: (!) 21 19 17 18   Temp:  97.6 F (36.4 C) 98.5 F (36.9 C) 98.3 F (36.8 C)  TempSrc:  Oral Oral Oral  SpO2:  100% 99% 99%  Weight:    60.3 kg  Height:        Intake/Output Summary (Last 24 hours) at 10/02/2018 0847 Last data filed at 10/01/2018 2332 Gross per 24 hour  Intake 401.31 ml  Output 300 ml  Net 101.31 ml   Last 3 Weights 10/02/2018 10/01/2018 09/30/2018  Weight (lbs) 133 lb 131 lb 4.8 oz 136 lb  Weight (kg) 60.328 kg 59.557 kg 61.689 kg      Telemetry    NSR 80s - Personally Reviewed  ECG    EKG 10/01/2018 showed acute inferior infarct with inferior ST elevations- Personally Reviewed  Physical Exam   GEN: elderly WF, reports 9/10 CP but does not appear to be in significant distress, respirations normal Neck: No JVD Cardiac: RRR, no murmurs, rubs, or gallops.  Respiratory:  Clear to auscultation bilaterally. GI: Soft, nontender, non-distended  MS: No edema; No deformity. Neuro:  Nonfocal  Psych: Normal affect   Labs    Chemistry Recent Labs  Lab 09/30/18 1928 10/01/18 0643 10/02/18 0035  NA 129* 139 140  K 5.4* 4.8 4.8  CL 93* 105 104  CO2 23 23 23   GLUCOSE 328* 163* 179*  BUN 51* 43* 30*  CREATININE 2.16* 1.80* 1.49*  CALCIUM 8.2* 8.1* 8.3*  PROT 7.3  --   --   ALBUMIN 4.3  --   --   AST 26  --   --   ALT 24  --   --   ALKPHOS 131*  --   --   BILITOT 0.3  --   --   GFRNONAA 19* 24* 30*  GFRAA 22* 28* 35*  ANIONGAP 13 11 13      Hematology Recent Labs  Lab 09/30/18 1928 10/01/18 0643 10/02/18 0035  WBC 11.5* 11.4* 12.9*  RBC 4.22 3.81* 3.65*  HGB 12.9 11.6* 11.1*  HCT 41.8 36.9 35.9*  MCV 99.1 96.9 98.4  MCH 30.6 30.4 30.4  MCHC 30.9 31.4 30.9  RDW 12.4 12.3 12.5  PLT 257 215 222    Cardiac Enzymes Recent Labs  Lab 10/01/18 1255 10/01/18 1902 10/02/18 0035  10/02/18 0720  TROPONINI 0.96* 3.00* 2.85* 2.70*   No results for input(s): TROPIPOC in the last 168 hours.   BNPNo results for input(s): BNP, PROBNP in the last 168 hours.   DDimer No results for input(s): DDIMER in the last 168 hours.   Radiology    Ct Abdomen Pelvis Wo Contrast  Result Date: 09/30/2018 CLINICAL DATA:  Chest pain since yesterday. Intermittent relief with nitroglycerin. Shortness of breath and diaphoresis. EXAM: CT CHEST, ABDOMEN AND PELVIS WITHOUT CONTRAST TECHNIQUE: Multidetector CT imaging of the chest, abdomen and pelvis was performed following the standard protocol without IV contrast. COMPARISON:  Chest x-ray September 30, 2018 FINDINGS: CT CHEST FINDINGS Cardiovascular: The thoracic aorta demonstrates atherosclerotic change without aneurysm. The fat around the thoracic aorta is normal. No evidence of leak. Dissection can not be assessed without contrast. The central pulmonary arteries are normal in caliber. Coronary artery calcifications involve the  LAD and circumflex artery primarily. The heart is unremarkable. Mediastinum/Nodes: The patient has a pectus deformity. No adenopathy. No effusion. Lungs/Pleura: Central airways are normal. No pneumothorax. Scattered nodularity in the lungs, right greater than left, particularly in the right middle lobe and posteriorly in the right lower lobe. A representative nodule in the right middle lobe measures 7.5 mm. No myalgias is. No other infiltrates. Musculoskeletal: See below. CT ABDOMEN PELVIS FINDINGS Hepatobiliary: No focal liver abnormality is seen. Status post cholecystectomy. No biliary dilatation. Pancreas: Unremarkable. No pancreatic ductal dilatation or surrounding inflammatory changes. Spleen: Normal in size without focal abnormality. Adrenals/Urinary Tract: Adrenal glands are unremarkable. Kidneys are normal, without renal calculi, focal lesion, or hydronephrosis. Bladder is unremarkable. Stomach/Bowel: A duodenal diverticulum is identified without evidence of inflammation. The stomach and small bowel are otherwise normal. Colonic diverticulosis is identified without convincing evidence of diverticulitis. The remainder of the colon is unremarkable. Status post appendectomy. Vascular/Lymphatic: Dense atherosclerotic change in the nonaneurysmal abdominal aorta. Dense atherosclerotic change at the origin of the left renal artery. No adenopathy. Reproductive: The patient may be status post hysterectomy. Recommend clinical correlation. Other: No abdominal wall hernia or abnormality. No abdominopelvic ascites. Musculoskeletal: Degenerative changes in the hips. Degenerative changes throughout the spine. Pedicle rods and screws seen at L3 and L4, in good position. No acute spinal fracture noted. IMPRESSION: 1. Nodularity in the right lower lobe and right middle lobe primarily with a few nodules on the left. I suspect an infectious process such as MAI. The findings however are not definitive. A representative nodule in  the right middle lobe measures 8 mm. Non-contrast chest CT at 3-6 months is recommended. If the nodules are stable at time of repeat CT, then future CT at 18-24 months (from today's scan) is considered optional for low-risk patients, but is recommended for high-risk patients. This recommendation follows the consensus statement: Guidelines for Management of Incidental Pulmonary Nodules Detected on CT Images: From the Fleischner Society 2017; Radiology 2017; 284:228-243. 2. Degenerative changes in the spine. No other cause for the patient's symptoms identified. 3. Atherosclerotic changes in the thoracic and abdominal aorta without aneurysm. 4. Coronary artery calcifications. Electronically Signed   By: Dorise Bullion III M.D   On: 09/30/2018 22:32   Ct Chest Wo Contrast  Result Date: 09/30/2018 CLINICAL DATA:  Chest pain since yesterday. Intermittent relief with nitroglycerin. Shortness of breath and diaphoresis. EXAM: CT CHEST, ABDOMEN AND PELVIS WITHOUT CONTRAST TECHNIQUE: Multidetector CT imaging of the chest, abdomen and pelvis was performed following the standard protocol without IV contrast. COMPARISON:  Chest x-ray September 30, 2018 FINDINGS: CT CHEST FINDINGS Cardiovascular: The thoracic aorta demonstrates atherosclerotic change without aneurysm. The fat around the thoracic aorta is normal. No evidence of leak. Dissection can not be assessed without contrast. The central pulmonary arteries are normal in caliber. Coronary artery calcifications involve the LAD and circumflex artery primarily. The heart is unremarkable. Mediastinum/Nodes: The patient has a pectus deformity. No adenopathy. No effusion. Lungs/Pleura: Central airways are normal. No pneumothorax. Scattered nodularity in the lungs, right greater than left, particularly in the right middle lobe and posteriorly in the right lower lobe. A representative nodule in the right middle lobe measures 7.5 mm. No myalgias is. No other infiltrates.  Musculoskeletal: See below. CT ABDOMEN PELVIS FINDINGS Hepatobiliary: No focal liver abnormality is seen. Status post cholecystectomy. No biliary dilatation. Pancreas: Unremarkable. No pancreatic ductal dilatation or surrounding inflammatory changes. Spleen: Normal in size without focal abnormality. Adrenals/Urinary Tract: Adrenal glands are unremarkable. Kidneys are normal, without renal calculi, focal lesion, or hydronephrosis. Bladder is unremarkable. Stomach/Bowel: A duodenal diverticulum is identified without evidence of inflammation. The stomach and small bowel are otherwise normal. Colonic diverticulosis is identified without convincing evidence of diverticulitis. The remainder of the colon is unremarkable. Status post appendectomy. Vascular/Lymphatic: Dense atherosclerotic change in the nonaneurysmal abdominal aorta. Dense atherosclerotic change at the origin of the left renal artery. No adenopathy. Reproductive: The patient may be status post hysterectomy. Recommend clinical correlation. Other: No abdominal wall hernia or abnormality. No abdominopelvic ascites. Musculoskeletal: Degenerative changes in the hips. Degenerative changes throughout the spine. Pedicle rods and screws seen at L3 and L4, in good position. No acute spinal fracture noted. IMPRESSION: 1. Nodularity in the right lower lobe and right middle lobe primarily with a few nodules on the left. I suspect an infectious process such as MAI. The findings however are not definitive. A representative nodule in the right middle lobe measures 8 mm. Non-contrast chest CT at 3-6 months is recommended. If the nodules are stable at time of repeat CT, then future CT at 18-24 months (from today's scan) is considered optional for low-risk patients, but is recommended for high-risk patients. This recommendation follows the consensus statement: Guidelines for Management of Incidental Pulmonary Nodules Detected on CT Images: From the Fleischner Society 2017;  Radiology 2017; 284:228-243. 2. Degenerative changes in the spine. No other cause for the patient's symptoms identified. 3. Atherosclerotic changes in the thoracic and abdominal aorta without aneurysm. 4. Coronary artery calcifications. Electronically Signed   By: Dorise Bullion III M.D   On: 09/30/2018 22:32   Dg Chest Port 1 View  Result Date: 10/01/2018 CLINICAL DATA:  85 year old female with lung crackles on physical exam EXAM: PORTABLE CHEST 1 VIEW COMPARISON:  Prior chest x-ray and CT scan of the chest 09/30/2018 FINDINGS: Stable cardiac and mediastinal contours. Atherosclerotic calcifications again noted in the transverse aorta. No evidence of pulmonary edema, new airspace consolidation, pleural effusion or pneumothorax. Inspiratory volumes are slightly low. No acute osseous abnormality. IMPRESSION: Stable chest x-ray without evidence of new acute cardiopulmonary process. Aortic Atherosclerosis (ICD10-170.0). Electronically Signed   By: Jacqulynn Cadet M.D.   On: 10/01/2018 16:17   Dg Chest Port 1 View  Result Date: 09/30/2018 CLINICAL DATA:  Chest pain since yesterday. EXAM: PORTABLE CHEST 1 VIEW COMPARISON:  June 26, 2017 FINDINGS: The heart size and mediastinal contours are within normal limits. Both lungs are clear. The visualized skeletal structures are unremarkable. IMPRESSION: No active disease. Electronically Signed   By: Dorise Bullion III M.D   On: 09/30/2018  20:22    Cardiac Studies   2D echocardiogram 10/02/2018 1. The left ventricle has normal systolic function with an ejection fraction of 60-65%. The cavity size was normal. Left ventricular diastolic Doppler parameters are consistent with impaired relaxation Elevated left ventricular end-diastolic pressure.  2. The right ventricle has normal systolic function. The cavity was normal. There is no increase in right ventricular wall thickness.  3. The mitral valve is normal in structure.  4. The tricuspid valve is normal in  structure.  5. The aortic valve is tricuspid.  6. The pulmonic valve was normal in structure.  Patient Profile     85 y.o. female who was admitted with several days of chest discomfort.  Initially her troponin levels were only minimally elevated.  on 3/2 she developed intensification of her pain associated with ST segment elevation in the inferior leads.  Assessment & Plan    1.  Inferior wall ST segment elevation myocardial infarction: Troponin peaked at 3.0 and is down trending>>> 2.85>>> 2.70. Echocardiogram was done yesterday and shows preserved left ventricular ejection fraction at 60 to 65%.  No significant valvular abnormalities. Given her advanced age, fragility and renal insufficiency, it has been decided to treat medically.  Patient is also a DNR.  In addition it appears that she has already had an anterior wall myocardial infarction on top of this new inferior wall myocardial infarction.  We suspect that she will likely have multivessel disease and that her options for PCI we will very limited.  Dr. Acie Fredrickson discussed with family members yesterday and all are in agreement to continue medical management.  She is currently being treated with aspirin, statin, IV heparin and IV nitroglycerin. She did fairly well last night but now with recurrent CP, rated 9/10. RN tried morphine w/o much improvement. SBP in the 180s and RN has just increased dose of IV nitro. Continue heparin. Consider restarting home metoprolol. Will continue to monitor and try to keep comfortable. Her daughter in law thinks that her anxiety may be contributing and requesting that her home alprazolam be restarted.   2.  Acute kidney injury: Renal function appears to be gradually improving.  Serum creatinine downtrending over the last 3 days from 2.16>> 1.80>> 1.49.  Continue to monitor and avoid nephrotoxic agents.  3. HTN: SBPs in the 180s. She reports active CP. IV nitro has been titrated monitor BP and symptom response. HR in  the upper 80s on tele. Will resume home  blocker. She was on metoprolol 50 mg BID.   4. DM: on insulin. Management per IM.   For questions or updates, please contact [place] Please consult [email or web address] for contact info under        Signed, Lyda Jester, PA-C  10/02/2018, 8:47 AM     Attending Note:   The patient was seen and examined.  Agree with assessment and plan as noted above.  Changes made to the above note as needed.  Patient seen and independently examined with Lyda Jester, PA .   We discussed all aspects of the encounter. I agree with the assessment and plan as stated above.  1.  Inferior wall myocardial infarction:    We chose to treat the patient medically given her age, chronic renal insufficiency and frail state. Had a little bit more pain this morning but had a restful night. Seems to develop more pain when her heart rate gets up into the 90s and when her blood pressure becomes elevated.   Has been on  metoprolol in the past but this was stopped on admission due to bradycardia.  She is not having any further episodes of bradycardia so we can cautiously add back her beta-blocker.  Had a long discussion with family.  Left ventricular systolic function remains well-preserved.   Not had any significant arrhythmias.  I am optimistic that her short-term prognosis is good.   2.  Chronic kidney disease: Patient's creatinine was 2 yesterday.  Today it is 1.49.  Her baseline is around 2.  This is likely because of some of the additional IV fluids that she is received.  We will continue to follow.   I have spent a total of 40 minutes with patient reviewing hospital  notes , telemetry, EKGs, labs and examining patient as well as establishing an assessment and plan that was discussed with the patient. > 50% of time was spent in direct patient care.  Thayer Headings, Brooke Bonito., MD, [facility] 10/02/2018, 10:02 AM [address]  [place] Pager  [phone]

## 2018-10-02 NOTE — Consult Note (Signed)
   [facility] Inpatient Consult   10/02/2018  Karen [name] [birth year] [id number]  Patient is currently active with [facility] for chronic disease management services. Chart review reveals per MD notes as follows: Karen [name] is a 85 y.o. female with medical history significant of hypertension, hyperlipidemia, CKD stage IV, OSA, GERD, type 2 diabetes, paroxysmal atrial fibrillation, prior TIA/CVA, anxiety, history of esophageal stricture status post dilation and hiatal hernia on PPI presenting to the hospital for evaluation of chest pain.   Patient has been engaged by a [facility] for complex disease management.  Our community based plan of care has focused on disease management and community resource support.  Patient will receive a post hospital call and will be evaluated for assessments and disease process education.   Inpatient Case Manager is aware that [facility] following.  Spoke with [place] Nurse and patient could benefit from a PT eval and Goals of Care. Inpatient RNCM aware and reviewed her notes. Of note, [facility] Care Management services does not replace or interfere with any services that are needed or arranged by inpatient case management or social work.  For additional questions or referrals please contact:   Natividad Brood, RN BSN [facility] Liaison  [phone] business mobile phone Toll free office [phone]

## 2018-10-02 NOTE — Progress Notes (Signed)
[place] for heparin Indication: chest pain/ACS  Allergies  Allergen Reactions  . Celebrex [Celecoxib] Rash  . Cymbalta [Duloxetine Hcl] Rash    Lip numbness,rash, leg and body jerks. Patient states " I though I was having a heart attack or stroke."   . Doxycycline Rash  . Lyrica [Pregabalin] Rash  . Avandia [Rosiglitazone] Rash  . Macrodantin [Nitrofurantoin Macrocrystal] Rash  . Silicone Rash    Gets rash from the touch it.  . Sulfa Antibiotics Rash  . Vioxx [Rofecoxib] Rash    Patient Measurements: Height: 5\' 3"  (160 cm) Weight: 133 lb (60.3 kg) IBW/kg (Calculated) : 52.4 Heparin Dosing Weight: 60kg  Vital Signs: Temp: 98.3 F (36.8 C) (03/03 0440) Temp Source: Oral (03/03 0440) BP: 167/67 (03/03 0955) Pulse Rate: 74 (03/03 0955)  Labs: Recent Labs    09/30/18 1928  10/01/18 1610  10/01/18 1643 10/01/18 1902 10/02/18 0035 10/02/18 0720 10/02/18 0924  HGB 12.9  --  11.6*  --   --   --  11.1*  --   --   HCT 41.8  --  36.9  --   --   --  35.9*  --   --   PLT 257  --  215  --   --   --  222  --   --   LABPROT 13.3  --   --   --   --   --   --   --   --   INR 1.0  --   --   --   --   --   --   --   --   HEPARINUNFRC  --   --   --   --  0.57  --   --   --  0.47  CREATININE 2.16*  --  1.80*  --   --   --  1.49*  --   --   TROPONINI 0.06*   < > 0.18*   < >  --  3.00* 2.85* 2.70*  --    < > = values in this interval not displayed.    Estimated Creatinine Clearance: 19.9 mL/min (A) (by C-G formula based on SCr of 1.49 mg/dL (H)).   Medical History: Past Medical History:  Diagnosis Date  . A-fib ([place])   . Abnormal involuntary movements(781.0)   . Anemia   . Anxiety   . Anxiety state, unspecified   . Arthritis    body, neck  . Benign hypertensive heart disease   . Benign hypertensive heart disease with congestive heart failure ([place])   . Bladder disorder    over active  . Cancer ([place])    skin, removed  . Cervicalgia    . CHF (congestive heart failure) ([place])   . Chronic kidney disease, stage III (moderate) (HCC)   . Congestive heart failure, unspecified   . Contact dermatitis and other eczema, due to unspecified cause   . Corns and callosities   . Diabetes mellitus without complication ([place])   . Diarrhea   . Dyspnea 02/18/2015  . Edema   . Esophageal stricture   . Gastroparesis   . Gastroparesis   . GERD (gastroesophageal reflux disease)   . Hiatal hernia   . Hyperlipidemia   . Inflamed seborrheic keratosis   . Inflammatory disease of breast   . Irregular heartbeat   . Lumbago   . Mixed incontinence urge and stress (female)(female)   . Nausea alone   .  Nonspecific (abnormal) findings on radiological and other examination of skull and head   . Osteoarthrosis, unspecified whether generalized or localized, unspecified site   . Other and unspecified hyperlipidemia   . Other B-complex deficiencies   . Other functional disorder of bladder   . Other malaise and fatigue   . Other specified visual disturbances   . Pain in joint, lower leg   . Palpitations   . Peripheral vascular disease, unspecified ([place])   . Postmenopausal atrophic vaginitis   . Reflux esophagitis   . Seborrheic keratosis   . Sleep apnea   . Spasm of muscle   . Spinal stenosis   . Spinal stenosis, unspecified region other than cervical   . Stricture and stenosis of esophagus   . Stroke ([place])   . TIA (transient ischemic attack) 2012  . Transient ischemic attack (TIA), and cerebral infarction without residual deficits(V12.54)   . Type II or unspecified type diabetes mellitus with renal manifestations, not stated as uncontrolled(250.40)   . Unspecified constipation   . Unspecified essential hypertension   . Unspecified hereditary and idiopathic peripheral neuropathy   . Unspecified hypothyroidism   . Unspecified pruritic disorder   . Unspecified sleep apnea   . Unspecified vitamin D deficiency   . Urinary tract infection, site  not specified   . Vitamin B12 deficiency    Assessment: 85 year old female with chest pain and cardiac enzymes trending up. Orders to start IV heparin. Patient does not appear to be on anticoagulation prior to admit. CBC ok.   Heparin level therapeutic at 0.47, hgb stable 11s. No bleeding noted. Did have continued pain overnight. Scr down, patient declined cath yesterday.   Goal of Therapy:  Heparin level 0.3-0.7 units/ml Monitor platelets by anticoagulation protocol: Yes   Plan:  Continue heparin gtt at 750 units/hr - assume 48 hours of therapy; will follow up in am Daily heparin level, CBC, s/s bleeding  Erin Hearing PharmD., BCPS Clinical Pharmacist 10/02/2018 11:12 AM

## 2018-10-02 NOTE — Progress Notes (Signed)
PROGRESS NOTE    Karen [name]  XBL:[id number] DOB: [birth year] DOA: 09/30/2018 PCP: No primary care provider on file.   Brief Narrative:  Per HPI Karen [name] is a 85 y.o. female with medical history significant of hypertension, hyperlipidemia, CKD stage IV, OSA, GERD, type 2 diabetes, paroxysmal atrial fibrillation, prior TIA/CVA, anxiety, history of esophageal stricture status post dilation and hiatal hernia on PPI presenting to the hospital for evaluation of chest pain. Patient states her chest pain started 2/29 at 5 AM and woke her up from her sleep.  She describes it as sharp right-sided chest pain which radiates to her right shoulder and right arm.  Associated with shortness of breath.  No nausea or diaphoresis.  2 hours later she spoke to her son over the phone who advised her to take sublingual nitroglycerin.  Her chest pain improved a little with sublingual nitroglycerin but persisted the entire day.  She took additional nitroglycerin around dinnertime which helped a little but again the chest pain persisted.  The following day she continued to have chest pain and took multiple doses of sublingual nitroglycerin with no improvement.  Patient received another dose of sublingual nitroglycerin in the ED but continues to have chest pain.  She is a never smoker.  Patient thinks her chest pain might be indigestion. Family at bedside states that patient has a history of esophageal stricture and several dilations done in the past.  Unclear why patient has sublingual nitroglycerin at home as family states she does not have any history of coronary artery disease.    Assessment & Plan:   Principal Problem:   Chest pain Active Problems:   Hyperkalemia   Hypertensive urgency   Abdominal tenderness   Dysuria   Hyperglycemia   ST elevation myocardial infarction involving right coronary artery (HCC)   STEMI - AM of 3/2, pt had chest pain and EKG changes with ST elevation in II and aVF  with depression in I, aVL. - Troponin peaked at 3.0 on 3/2, now downtrending - Aspirin, heparin gtt - nitro gtt, prn morphine (did well overnight, but worsening pain this AM, will have additional 2 mg morphine available for uncontrolled pain, follow closely) - resume home metoprolol - echo with normal EF, impaired relaxation, see report (no reported WMA) - Appreciate cardiology assistance from Dr. Acie Fredrickson.  After discussion of risks/benefits, decision against catheterization.  Plan for medical management.   Hypertensive urgency Blood pressure significantly elevated on arrival (205/69).   Improved today to 150's (elevated this AM) -Continue to monitor -hydralazine prn  Abdominal tenderness - minimal on my exam this AM - CT abdomen/pelvis without acute findings - UA bland, LFT's and lipase wnl on presentation  ?MAI infection - will plan to discuss with ID -CT chest showing nodularity in the right lower lobe and right middle lobe primarily with a few nodules on the left, suspected to be an infectious process such as MAI, however, findings not definitive.  There is also a nodule in the right middle lobe measuring 8 mm. -Patient denies any fevers, night sweats, weight loss, or malaise to admitting physician  -White count borderline elevated at 11.5.  Patient is afebrile and nontoxic-appearing. -Received ceftriaxone and azithromycin in the ED.  Hold further abx. -Patient will need repeat noncontrast CT in 3 months to ensure resolution. - follow blood cultures  Dysuria -Patient reports dysuria.  Afebrile but white count borderline elevated. -Check UA (not suggestive of UTI)  Mild hyperkalemia - resolved  Hyperglycemia, history  of type 2 diabetes - Start regular diet per pt/family request -Continue home Lantus 36 units at bedtime  - holding mealtime insulin for now - ACHS SSI  CKD stage IV -Stable.  Baseline ~1.8. - Cr 1.49 today.  Follow.  -Continue monitor renal  function  DVT prophylaxis: heparin gtt Code Status: DNR Family Communication: daughter in law/son at bedside Disposition Plan: pending treatment of STEMI, s/o by cardiology  Consultants:   Cardiology  Procedures:   none  Antimicrobials:  Anti-infectives (From admission, onward)   Start     Dose/Rate Route Frequency Ordered Stop   09/30/18 2345  cefTRIAXone (ROCEPHIN) 1 g in sodium chloride 0.9 % 100 mL IVPB     1 g 200 mL/hr over 30 Minutes Intravenous  Once 09/30/18 2337 10/01/18 0059   09/30/18 2345  azithromycin (ZITHROMAX) 500 mg in sodium chloride 0.9 % 250 mL IVPB     500 mg 250 mL/hr over 60 Minutes Intravenous  Once 09/30/18 2337 10/01/18 0159     Subjective: Worse CP this AM.  Improved after morphine Daughter in law and son at bedside as well as pastor  Objective: Vitals:   10/02/18 0931 10/02/18 0946 10/02/18 0953 10/02/18 0955  BP: (!) 157/68 (!) 159/66 (!) 153/70 (!) 167/67  Pulse: 89 91 90 74  Resp: 12 13 15 13   Temp:      TempSrc:      SpO2: 97% 99% 98% 100%  Weight:      Height:        Intake/Output Summary (Last 24 hours) at 10/02/2018 1028 Last data filed at 10/01/2018 2332 Gross per 24 hour  Intake 401.31 ml  Output 300 ml  Net 101.31 ml   Filed Weights   09/30/18 1913 10/01/18 0207 10/02/18 0440  Weight: 61.7 kg 59.6 kg 60.3 kg    Examination:  General: No acute distress. Cardiovascular: Heart sounds show a regular rate, and rhythm Lungs: Clear to auscultation bilaterally Abdomen: Soft, nontender, nondistended  Neurological: Alert and oriented 3. Moves all extremities 4. Cranial nerves II through XII grossly intact. Skin: Warm and dry. No rashes or lesions. Extremities: No clubbing or cyanosis. No edema.  Psychiatric: Mood and affect are normal. Insight and judgment are appropriate.   Data Reviewed: I have personally reviewed following labs and imaging studies  CBC: Recent Labs  Lab 09/30/18 1928 10/01/18 0643 10/02/18 0035   WBC 11.5* 11.4* 12.9*  NEUTROABS 8.3*  --   --   HGB 12.9 11.6* 11.1*  HCT 41.8 36.9 35.9*  MCV 99.1 96.9 98.4  PLT 257 215 810   Basic Metabolic Panel: Recent Labs  Lab 09/30/18 1928 10/01/18 0643 10/02/18 0035  NA 129* 139 140  K 5.4* 4.8 4.8  CL 93* 105 104  CO2 23 23 23   GLUCOSE 328* 163* 179*  BUN 51* 43* 30*  CREATININE 2.16* 1.80* 1.49*  CALCIUM 8.2* 8.1* 8.3*   GFR: Estimated Creatinine Clearance: 19.9 mL/min (A) (by C-G formula based on SCr of 1.49 mg/dL (H)). Liver Function Tests: Recent Labs  Lab 09/30/18 1928  AST 26  ALT 24  ALKPHOS 131*  BILITOT 0.3  PROT 7.3  ALBUMIN 4.3   Recent Labs  Lab 09/30/18 1928  LIPASE 20   No results for input(s): AMMONIA in the last 168 hours. Coagulation Profile: Recent Labs  Lab 09/30/18 1928  INR 1.0   Cardiac Enzymes: Recent Labs  Lab 10/01/18 0643 10/01/18 1255 10/01/18 1902 10/02/18 0035 10/02/18 0720  TROPONINI 0.18*  0.96* 3.00* 2.85* 2.70*   BNP (last 3 results) No results for input(s): PROBNP in the last 8760 hours. HbA1C: No results for input(s): HGBA1C in the last 72 hours. CBG: Recent Labs  Lab 10/01/18 1708 10/01/18 2011 10/01/18 2259 10/02/18 0429 10/02/18 0742  GLUCAP 242* 170* 167* 175* 133*   Lipid Profile: No results for input(s): CHOL, HDL, LDLCALC, TRIG, CHOLHDL, LDLDIRECT in the last 72 hours. Thyroid Function Tests: No results for input(s): TSH, T4TOTAL, FREET4, T3FREE, THYROIDAB in the last 72 hours. Anemia Panel: No results for input(s): VITAMINB12, FOLATE, FERRITIN, TIBC, IRON, RETICCTPCT in the last 72 hours. Sepsis Labs: No results for input(s): PROCALCITON, LATICACIDVEN in the last 168 hours.  Recent Results (from the past 240 hour(s))  Blood culture (routine x 2)     Status: None (Preliminary result)   Collection Time: 09/30/18 11:45 PM  Result Value Ref Range Status   Specimen Description BLOOD RIGHT ARM  Final   Special Requests   Final    BOTTLES DRAWN  AEROBIC AND ANAEROBIC Blood Culture adequate volume   Culture   Final    NO GROWTH < 24 HOURS Performed at [facility], [address] [place], [place] [address]    Report Status PENDING  Incomplete  Blood culture (routine x 2)     Status: None (Preliminary result)   Collection Time: 09/30/18 11:55 PM  Result Value Ref Range Status   Specimen Description BLOOD LEFT WRIST  Final   Special Requests   Final    BOTTLES DRAWN AEROBIC AND ANAEROBIC Blood Culture adequate volume   Culture   Final    NO GROWTH < 24 HOURS Performed at [facility], [place] [address], [place], [place] [address]    Report Status PENDING  Incomplete         Radiology Studies: Ct Abdomen Pelvis Wo Contrast  Result Date: 09/30/2018 CLINICAL DATA:  Chest pain since yesterday. Intermittent relief with nitroglycerin. Shortness of breath and diaphoresis. EXAM: CT CHEST, ABDOMEN AND PELVIS WITHOUT CONTRAST TECHNIQUE: Multidetector CT imaging of the chest, abdomen and pelvis was performed following the standard protocol without IV contrast. COMPARISON:  Chest x-ray September 30, 2018 FINDINGS: CT CHEST FINDINGS Cardiovascular: The thoracic aorta demonstrates atherosclerotic change without aneurysm. The fat around the thoracic aorta is normal. No evidence of leak. Dissection can not be assessed without contrast. The central pulmonary arteries are normal in caliber. Coronary artery calcifications involve the LAD and circumflex artery primarily. The heart is unremarkable. Mediastinum/Nodes: The patient has a pectus deformity. No adenopathy. No effusion. Lungs/Pleura: Central airways are normal. No pneumothorax. Scattered nodularity in the lungs, right greater than left, particularly in the right middle lobe and posteriorly in the right lower lobe. A representative nodule in the right middle lobe measures 7.5 mm. No myalgias is. No other infiltrates. Musculoskeletal: See below. CT ABDOMEN PELVIS FINDINGS Hepatobiliary: No  focal liver abnormality is seen. Status post cholecystectomy. No biliary dilatation. Pancreas: Unremarkable. No pancreatic ductal dilatation or surrounding inflammatory changes. Spleen: Normal in size without focal abnormality. Adrenals/Urinary Tract: Adrenal glands are unremarkable. Kidneys are normal, without renal calculi, focal lesion, or hydronephrosis. Bladder is unremarkable. Stomach/Bowel: A duodenal diverticulum is identified without evidence of inflammation. The stomach and small bowel are otherwise normal. Colonic diverticulosis is identified without convincing evidence of diverticulitis. The remainder of the colon is unremarkable. Status post appendectomy. Vascular/Lymphatic: Dense atherosclerotic change in the nonaneurysmal abdominal aorta. Dense atherosclerotic change at the origin of the left  renal artery. No adenopathy. Reproductive: The patient may be status post hysterectomy. Recommend clinical correlation. Other: No abdominal wall hernia or abnormality. No abdominopelvic ascites. Musculoskeletal: Degenerative changes in the hips. Degenerative changes throughout the spine. Pedicle rods and screws seen at L3 and L4, in good position. No acute spinal fracture noted. IMPRESSION: 1. Nodularity in the right lower lobe and right middle lobe primarily with a few nodules on the left. I suspect an infectious process such as MAI. The findings however are not definitive. A representative nodule in the right middle lobe measures 8 mm. Non-contrast chest CT at 3-6 months is recommended. If the nodules are stable at time of repeat CT, then future CT at 18-24 months (from today's scan) is considered optional for low-risk patients, but is recommended for high-risk patients. This recommendation follows the consensus statement: Guidelines for Management of Incidental Pulmonary Nodules Detected on CT Images: From the Fleischner Society 2017; Radiology 2017; 284:228-243. 2. Degenerative changes in the spine. No other  cause for the patient's symptoms identified. 3. Atherosclerotic changes in the thoracic and abdominal aorta without aneurysm. 4. Coronary artery calcifications. Electronically Signed   By: Dorise Bullion III M.D   On: 09/30/2018 22:32   Ct Chest Wo Contrast  Result Date: 09/30/2018 CLINICAL DATA:  Chest pain since yesterday. Intermittent relief with nitroglycerin. Shortness of breath and diaphoresis. EXAM: CT CHEST, ABDOMEN AND PELVIS WITHOUT CONTRAST TECHNIQUE: Multidetector CT imaging of the chest, abdomen and pelvis was performed following the standard protocol without IV contrast. COMPARISON:  Chest x-ray September 30, 2018 FINDINGS: CT CHEST FINDINGS Cardiovascular: The thoracic aorta demonstrates atherosclerotic change without aneurysm. The fat around the thoracic aorta is normal. No evidence of leak. Dissection can not be assessed without contrast. The central pulmonary arteries are normal in caliber. Coronary artery calcifications involve the LAD and circumflex artery primarily. The heart is unremarkable. Mediastinum/Nodes: The patient has a pectus deformity. No adenopathy. No effusion. Lungs/Pleura: Central airways are normal. No pneumothorax. Scattered nodularity in the lungs, right greater than left, particularly in the right middle lobe and posteriorly in the right lower lobe. A representative nodule in the right middle lobe measures 7.5 mm. No myalgias is. No other infiltrates. Musculoskeletal: See below. CT ABDOMEN PELVIS FINDINGS Hepatobiliary: No focal liver abnormality is seen. Status post cholecystectomy. No biliary dilatation. Pancreas: Unremarkable. No pancreatic ductal dilatation or surrounding inflammatory changes. Spleen: Normal in size without focal abnormality. Adrenals/Urinary Tract: Adrenal glands are unremarkable. Kidneys are normal, without renal calculi, focal lesion, or hydronephrosis. Bladder is unremarkable. Stomach/Bowel: A duodenal diverticulum is identified without evidence of  inflammation. The stomach and small bowel are otherwise normal. Colonic diverticulosis is identified without convincing evidence of diverticulitis. The remainder of the colon is unremarkable. Status post appendectomy. Vascular/Lymphatic: Dense atherosclerotic change in the nonaneurysmal abdominal aorta. Dense atherosclerotic change at the origin of the left renal artery. No adenopathy. Reproductive: The patient may be status post hysterectomy. Recommend clinical correlation. Other: No abdominal wall hernia or abnormality. No abdominopelvic ascites. Musculoskeletal: Degenerative changes in the hips. Degenerative changes throughout the spine. Pedicle rods and screws seen at L3 and L4, in good position. No acute spinal fracture noted. IMPRESSION: 1. Nodularity in the right lower lobe and right middle lobe primarily with a few nodules on the left. I suspect an infectious process such as MAI. The findings however are not definitive. A representative nodule in the right middle lobe measures 8 mm. Non-contrast chest CT at 3-6 months is recommended. If the nodules are  stable at time of repeat CT, then future CT at 18-24 months (from today's scan) is considered optional for low-risk patients, but is recommended for high-risk patients. This recommendation follows the consensus statement: Guidelines for Management of Incidental Pulmonary Nodules Detected on CT Images: From the Fleischner Society 2017; Radiology 2017; 284:228-243. 2. Degenerative changes in the spine. No other cause for the patient's symptoms identified. 3. Atherosclerotic changes in the thoracic and abdominal aorta without aneurysm. 4. Coronary artery calcifications. Electronically Signed   By: Dorise Bullion III M.D   On: 09/30/2018 22:32   Dg Chest Port 1 View  Result Date: 10/01/2018 CLINICAL DATA:  85 year old female with lung crackles on physical exam EXAM: PORTABLE CHEST 1 VIEW COMPARISON:  Prior chest x-ray and CT scan of the chest 09/30/2018  FINDINGS: Stable cardiac and mediastinal contours. Atherosclerotic calcifications again noted in the transverse aorta. No evidence of pulmonary edema, new airspace consolidation, pleural effusion or pneumothorax. Inspiratory volumes are slightly low. No acute osseous abnormality. IMPRESSION: Stable chest x-ray without evidence of new acute cardiopulmonary process. Aortic Atherosclerosis (ICD10-170.0). Electronically Signed   By: Jacqulynn Cadet M.D.   On: 10/01/2018 16:17   Dg Chest Port 1 View  Result Date: 09/30/2018 CLINICAL DATA:  Chest pain since yesterday. EXAM: PORTABLE CHEST 1 VIEW COMPARISON:  June 26, 2017 FINDINGS: The heart size and mediastinal contours are within normal limits. Both lungs are clear. The visualized skeletal structures are unremarkable. IMPRESSION: No active disease. Electronically Signed   By: Dorise Bullion III M.D   On: 09/30/2018 20:22        Scheduled Meds: . ALPRAZolam  1 mg Oral QHS  . aspirin EC  81 mg Oral Daily  . atorvastatin  10 mg Oral q1800  . gabapentin  200 mg Oral QHS  . insulin aspart  0-5 Units Subcutaneous QHS  . insulin aspart  0-9 Units Subcutaneous TID WC  . insulin aspart  15 Units Subcutaneous Once  . insulin glargine  36 Units Subcutaneous QHS  . levothyroxine  125 mcg Oral Q0600  . metoprolol tartrate  50 mg Oral BID  . pantoprazole  40 mg Oral Daily   Continuous Infusions: . heparin 750 Units/hr (10/01/18 2000)  . nitroGLYCERIN 45 mcg/min (10/02/18 0941)     LOS: 1 day    Time spent: over 30 min    Fayrene Helper, MD [facility] Pager AMION  If 7PM-7AM, please contact night-coverage [email or web address] Password [facility] 10/02/2018, 10:28 AM

## 2018-10-03 DIAGNOSIS — R739 Hyperglycemia, unspecified: Secondary | ICD-10-CM

## 2018-10-03 DIAGNOSIS — R300 Dysuria: Secondary | ICD-10-CM

## 2018-10-03 DIAGNOSIS — I160 Hypertensive urgency: Secondary | ICD-10-CM

## 2018-10-03 DIAGNOSIS — E875 Hyperkalemia: Secondary | ICD-10-CM

## 2018-10-03 DIAGNOSIS — R10819 Abdominal tenderness, unspecified site: Secondary | ICD-10-CM

## 2018-10-03 LAB — CBC
HEMATOCRIT: 34.1 % — AB (ref 36.0–46.0)
HEMOGLOBIN: 10.4 g/dL — AB (ref 12.0–15.0)
MCH: 30.3 pg (ref 26.0–34.0)
MCHC: 30.5 g/dL (ref 30.0–36.0)
MCV: 99.4 fL (ref 80.0–100.0)
Platelets: 205 10*3/uL (ref 150–400)
RBC: 3.43 MIL/uL — ABNORMAL LOW (ref 3.87–5.11)
RDW: 12.3 % (ref 11.5–15.5)
WBC: 12.6 10*3/uL — ABNORMAL HIGH (ref 4.0–10.5)
nRBC: 0 % (ref 0.0–0.2)

## 2018-10-03 LAB — BASIC METABOLIC PANEL
Anion gap: 8 (ref 5–15)
BUN: 22 mg/dL (ref 8–23)
CO2: 28 mmol/L (ref 22–32)
Calcium: 8.8 mg/dL — ABNORMAL LOW (ref 8.9–10.3)
Chloride: 101 mmol/L (ref 98–111)
Creatinine, Ser: 1.25 mg/dL — ABNORMAL HIGH (ref 0.44–1.00)
GFR calc non Af Amer: 37 mL/min — ABNORMAL LOW (ref >60)
GFR, EST AFRICAN AMERICAN: 43 mL/min — AB (ref >60)
Glucose, Bld: 187 mg/dL — ABNORMAL HIGH (ref 70–99)
Potassium: 5 mmol/L (ref 3.5–5.1)
Sodium: 137 mmol/L (ref 135–145)

## 2018-10-03 LAB — HEPARIN LEVEL (UNFRACTIONATED): Heparin Unfractionated: 0.29 IU/mL — ABNORMAL LOW (ref 0.30–0.70)

## 2018-10-03 LAB — GLUCOSE, CAPILLARY
Glucose-Capillary: 154 mg/dL — ABNORMAL HIGH (ref 70–99)
Glucose-Capillary: 206 mg/dL — ABNORMAL HIGH (ref 70–99)
Glucose-Capillary: 175 mg/dL — ABNORMAL HIGH (ref 70–99)
Glucose-Capillary: 274 mg/dL — ABNORMAL HIGH (ref 70–99)

## 2018-10-03 MED ORDER — INSULIN ASPART 100 UNIT/ML ~~LOC~~ SOLN
0.0000 [IU] | Freq: Three times a day (TID) | SUBCUTANEOUS | Status: DC
  Administered 2018-10-03: 5 [IU] via SUBCUTANEOUS
  Administered 2018-10-03: 3 [IU] via SUBCUTANEOUS
  Administered 2018-10-04 (×2): 5 [IU] via SUBCUTANEOUS
  Administered 2018-10-04: 3 [IU] via SUBCUTANEOUS
  Administered 2018-10-05: 2 [IU] via SUBCUTANEOUS
  Administered 2018-10-05: 5 [IU] via SUBCUTANEOUS
  Administered 2018-10-05: 3 [IU] via SUBCUTANEOUS

## 2018-10-03 MED ORDER — AMLODIPINE BESYLATE 2.5 MG PO TABS
2.5000 mg | ORAL_TABLET | Freq: Every day | ORAL | Status: DC
  Administered 2018-10-03 – 2018-10-08 (×6): 2.5 mg via ORAL
  Filled 2018-10-03 (×6): qty 1

## 2018-10-03 MED ORDER — METOPROLOL TARTRATE 25 MG PO TABS
25.0000 mg | ORAL_TABLET | Freq: Two times a day (BID) | ORAL | Status: DC
  Administered 2018-10-03 – 2018-10-04 (×3): 25 mg via ORAL
  Filled 2018-10-03 (×4): qty 1

## 2018-10-03 NOTE — Progress Notes (Signed)
Progress Note  Patient Name: LIDWINA KANER Date of Encounter: 10/03/2018  Primary Cardiologist: Mertie Moores, MD   Subjective   Chest pain has returned this morning. No improvement with morphine. SBPs in the 180s. RN just increased dose of IV nitro prior to my exam. Her son and daughter in law are present at bedside. She remains DNR. She has also felt anxious. Family requesting if her home alprazolam can be resumed as well as her home Prilosec.   Inpatient Medications    Scheduled Meds: . ALPRAZolam  1 mg Oral QHS  . aspirin EC  81 mg Oral Daily  . atorvastatin  10 mg Oral q1800  . gabapentin  200 mg Oral QHS  . insulin aspart  0-15 Units Subcutaneous TID WC  . insulin aspart  0-5 Units Subcutaneous QHS  . insulin glargine  36 Units Subcutaneous QHS  . levothyroxine  125 mcg Oral Q0600  . metoprolol tartrate  50 mg Oral BID  . pantoprazole  40 mg Oral Daily  . polyethylene glycol  17 g Oral Daily   Continuous Infusions: . heparin 750 Units/hr (10/02/18 1437)  . nitroGLYCERIN 35 mcg/min (10/02/18 1442)   PRN Meds: acetaminophen, ALPRAZolam, hydrALAZINE, morphine injection, ondansetron (ZOFRAN) IV, traMADol   Vital Signs    Vitals:   10/02/18 2058 10/02/18 2215 10/03/18 0521 10/03/18 0817  BP: (!) 122/51 134/64 (!) 141/64 (!) 141/58  Pulse: 60 60 (!) 59 (!) 56  Resp: 19  20 14   Temp: 98.3 F (36.8 C)  98.8 F (37.1 C)   TempSrc: Oral  Oral   SpO2: 100%  99% 99%  Weight:   60.6 kg   Height:        Intake/Output Summary (Last 24 hours) at 10/03/2018 0846 Last data filed at 10/03/2018 0800 Gross per 24 hour  Intake 1044.52 ml  Output 850 ml  Net 194.52 ml   Last 3 Weights 10/03/2018 10/02/2018 10/01/2018  Weight (lbs) 133 lb 9.6 oz 133 lb 131 lb 4.8 oz  Weight (kg) 60.601 kg 60.328 kg 59.557 kg      Telemetry    NSR 80s - Personally Reviewed  ECG    EKG 10/01/2018 showed acute inferior infarct with inferior ST elevations- Personally Reviewed  Physical  Exam   Physical Exam: Blood pressure (!) 141/58, pulse (!) 56, temperature 98.8 F (37.1 C), temperature source Oral, resp. rate 14, height 5\' 3"  (1.6 m), weight 60.6 kg, SpO2 99 %.  GEN:   Elderly female ,    HEENT: Normal NECK: No JVD; No carotid bruits LYMPHATICS: No lymphadenopathy   CARDIAC: RRR,  Soft systolic murmur  RESPIRATORY:  Mild atelectasis,  No rales.  ABDOMEN: Soft, non-tender, non-distended MUSCULOSKELETAL:  No edema; No deformity  SKIN: Warm and dry NEUROLOGIC:  Alert and oriented x 3   Labs    Chemistry Recent Labs  Lab 09/30/18 1928 10/01/18 0643 10/02/18 0035 10/03/18 0327  NA 129* 139 140 137  K 5.4* 4.8 4.8 5.0  CL 93* 105 104 101  CO2 23 23 23 28   GLUCOSE 328* 163* 179* 187*  BUN 51* 43* 30* 22  CREATININE 2.16* 1.80* 1.49* 1.25*  CALCIUM 8.2* 8.1* 8.3* 8.8*  PROT 7.3  --   --   --   ALBUMIN 4.3  --   --   --   AST 26  --   --   --   ALT 24  --   --   --  ALKPHOS 131*  --   --   --   BILITOT 0.3  --   --   --   GFRNONAA 19* 24* 30* 37*  GFRAA 22* 28* 35* 43*  ANIONGAP 13 11 13 8      Hematology Recent Labs  Lab 10/01/18 0643 10/02/18 0035 10/03/18 0327  WBC 11.4* 12.9* 12.6*  RBC 3.81* 3.65* 3.43*  HGB 11.6* 11.1* 10.4*  HCT 36.9 35.9* 34.1*  MCV 96.9 98.4 99.4  MCH 30.4 30.4 30.3  MCHC 31.4 30.9 30.5  RDW 12.3 12.5 12.3  PLT 215 222 205    Cardiac Enzymes Recent Labs  Lab 10/01/18 1255 10/01/18 1902 10/02/18 0035 10/02/18 0720  TROPONINI 0.96* 3.00* 2.85* 2.70*   No results for input(s): TROPIPOC in the last 168 hours.   BNPNo results for input(s): BNP, PROBNP in the last 168 hours.   DDimer No results for input(s): DDIMER in the last 168 hours.   Radiology    Dg Chest Port 1 View  Result Date: 10/01/2018 CLINICAL DATA:  85 year old female with lung crackles on physical exam EXAM: PORTABLE CHEST 1 VIEW COMPARISON:  Prior chest x-ray and CT scan of the chest 09/30/2018 FINDINGS: Stable cardiac and mediastinal  contours. Atherosclerotic calcifications again noted in the transverse aorta. No evidence of pulmonary edema, new airspace consolidation, pleural effusion or pneumothorax. Inspiratory volumes are slightly low. No acute osseous abnormality. IMPRESSION: Stable chest x-ray without evidence of new acute cardiopulmonary process. Aortic Atherosclerosis (ICD10-170.0). Electronically Signed   By: Jacqulynn Cadet M.D.   On: 10/01/2018 16:17    Cardiac Studies   2D echocardiogram 10/02/2018 1. The left ventricle has normal systolic function with an ejection fraction of 60-65%. The cavity size was normal. Left ventricular diastolic Doppler parameters are consistent with impaired relaxation Elevated left ventricular end-diastolic pressure.  2. The right ventricle has normal systolic function. The cavity was normal. There is no increase in right ventricular wall thickness.  3. The mitral valve is normal in structure.  4. The tricuspid valve is normal in structure.  5. The aortic valve is tricuspid.  6. The pulmonic valve was normal in structure.  Patient Profile     85 y.o. female who was admitted with several days of chest discomfort.  Initially her troponin levels were only minimally elevated.  on 3/2 she developed intensification of her pain associated with ST segment elevation in the inferior leads.  Assessment & Plan    1.  Inferior wall ST segment elevation myocardial infarction: Troponin peaked at 3.0 and is down trending>>> 2.85>>> 2.70. Echocardiogram was done yesterday and shows preserved left ventricular ejection fraction at 60 to 65%.  No significant valvular abnormalities. Given her advanced age, fragility and renal insufficiency, it has been decided to treat medically.  Patient is also a DNR.  In addition it appears that she has already had an anterior wall myocardial infarction on top of this new inferior wall myocardial infarction.  We suspect that she will likely have multivessel disease and  that her options for PCI we will very limited.  Dr. Acie Fredrickson discussed with family members yesterday and all are in agreement to continue medical management.  She is currently being treated with aspirin, statin, IV heparin and IV nitroglycerin. She did fairly well last night but now with recurrent CP, rated 9/10. RN tried morphine w/o much improvement. SBP in the 180s and RN has just increased dose of IV nitro. Continue heparin.   Feeling better this am HR is  a bit slow. Will reduce the metoprolol to 25 BID and continue to follow   2.  Acute kidney injury: Renal function  Has improved off her lasix I think she is eating less salt than she does at home   3. HTN:   BP is still slightly elevated.   Add amlodipine 2.5 a day   4. DM:    For questions or updates, please contact [facility] Please consult [email or web address] for contact info under        Signed, Mertie Moores, MD  10/03/2018, 8:46 AM

## 2018-10-03 NOTE — Progress Notes (Signed)
Inpatient Diabetes Program Recommendations  AACE/ADA: New Consensus Statement on Inpatient Glycemic Control (2015)  Target Ranges:  Prepandial:   less than 140 mg/dL      Peak postprandial:   less than 180 mg/dL (1-2 hours)      Critically ill patients:  140 - 180 mg/dL   Results for Karen [name], Karen [name] (MRN [id number]) as of 10/03/2018 11:23  Ref. Range 10/02/2018 07:42 10/02/2018 12:05 10/02/2018 18:05 10/02/2018 20:56 10/03/2018 08:11  Glucose-Capillary Latest Ref Range: 70 - 99 mg/dL 133 (H) 193 (H) 256 (H) 286 (H) 154 (H)    Review of Glycemic Control  Diabetes history: DM 2 Outpatient Diabetes medications: Lantus 36 units, Humalog 10 units breakfast, 16 units lunch, 16 units supper  Current orders for Inpatient glycemic control: Lantus 36 units, Novolog 0-15 units tid, Novolog 0-5 qhs  Inpatient Diabetes Program Recommendations:    Gucose trends increased after meals into the 200 range. Patient takes meal coverage at home. Please consider Novolog 2-3 units tid meal coverage if patient is consuming at least 50% of meals.  Thanks,  Tama Headings RN, MSN, BC-ADM Inpatient Diabetes Coordinator Team Pager [phone] (8a-5p)

## 2018-10-03 NOTE — Progress Notes (Signed)
PROGRESS NOTE    [name]  EHM:[id number] DOB: [birth year] DOA: 09/30/2018 PCP: No primary care provider on file.   Brief Narrative:  HPI on 10/01/2018 by Dr. Shela Leff Karen [name] is a 85 y.o. female with medical history significant of hypertension, hyperlipidemia, CKD stage IV, OSA, GERD, type 2 diabetes, paroxysmal atrial fibrillation, prior TIA/CVA, anxiety, history of esophageal stricture status post dilation and hiatal hernia on PPI presenting to the hospital for evaluation of chest pain. Patient states her chest pain started 2/29 at 5 AM and woke her up from her sleep.  She describes it as sharp right-sided chest pain which radiates to her right shoulder and right arm.  Associated with shortness of breath.  No nausea or diaphoresis.  2 hours later she spoke to her son over the phone who advised her to take sublingual nitroglycerin.  Her chest pain improved a little with sublingual nitroglycerin but persisted the entire day.  She took additional nitroglycerin around dinnertime which helped a little but again the chest pain persisted.  The following day she continued to have chest pain and took multiple doses of sublingual nitroglycerin with no improvement.  Patient received another dose of sublingual nitroglycerin in the ED but continues to have chest pain.  She is a never smoker.  Patient thinks her chest pain might be indigestion. Family at bedside states that patient has a history of esophageal stricture and several dilations done in the past.  Unclear why patient has sublingual nitroglycerin at home as family states she does not have any history of coronary artery disease.   Interim history Patient admitted with STEMI, cardiology consulted and appreciated.  Given patient's age, will treat with medical therapy.  Assessment & Plan   STEMI -Patient presented with chest pain and was noted to have EKG changes with ST elevation in leads II and aVF, depression in I and  aVL -Troponin peaked at 3, and is now downtrending -Cardiology consulted and appreciated, patient was placed on aspirin and heparin gtt -Given patient's age, it was decided against catheterization and to treat with medical management -Echocardiogram EF of 60 to 65%.  LV diastolic parameters are consistent with impaired relaxation elevated left ventricular end-diastolic pressure -Continue metoprolol (patient with mild bradycardia today, will place on reduced dose of metoprolol)  Hypertensive urgency -Upon arrival to the emergency department patient had elevated blood pressure of 205/69, has now improved but continues to be borderline high -Continue hydralazine -Reduce metoprolol due to bradycardia -Discussed with Dr. Acie Fredrickson, will place on low-dose amlodipine  Abdominal tenderness -Unremarkable on exam today -CT abdomen pelvis without acute findings -UA unremarkable, LFTs and lipase within normal limits on presentation  Dysuria -Patient reported dysuria however was afebrile with borderline WBC count -UA unremarkable for infection  ?MAI infection -CT chest showed nodularity in the right lower lobe and right middle lobe primarily with few nodules on the left, suspected infectious process such as MAI however findings not definitive/nodule right middle lobe measuring 8 mm -Patient denies fevers, night sweats, weight loss -WBC 12.6 -Currently afebrile nontoxic-appearing -Was placed on ceftriaxone and azithromycin in the ED however this is currently held -Patient will need repeat noncontrast CT in 3 months to ensure resolution -Blood cultures show no growth to date  Hyperkalemia -Resolved, continue to monitor BMP  Diabetes mellitus, type II with hyperglycemia -Continue Lantus 36 units daily -Will change over to moderate insulin sliding scale with CBG monitoring  Chronic kidney disease, stage IV -Creatinine baseline approximate 1.8 -Creatinine  currently 1.25 -Continue to monitor  BMP  Hypothyroidism -Continue Synthroid  DVT Prophylaxis  heparin  Code Status: DNR  Family Communication: Family at bedside  Disposition Plan: Admitted.   Consultants Cardiology  Procedures  Echocardiogram  Antibiotics   Anti-infectives (From admission, onward)   Start     Dose/Rate Route Frequency Ordered Stop   09/30/18 2345  cefTRIAXone (ROCEPHIN) 1 g in sodium chloride 0.9 % 100 mL IVPB     1 g 200 mL/hr over 30 Minutes Intravenous  Once 09/30/18 2337 10/01/18 0059   09/30/18 2345  azithromycin (ZITHROMAX) 500 mg in sodium chloride 0.9 % 250 mL IVPB     500 mg 250 mL/hr over 60 Minutes Intravenous  Once 09/30/18 2337 10/01/18 0159      Subjective:   Karen [name] seen and examined today.  Patient with no complaints this morning.  Denies current chest pain, shortness breath, abdominal pain, nausea or vomiting, diarrhea constipation, dizziness or headache.  Objective:   Vitals:   10/02/18 2215 10/03/18 0521 10/03/18 0817 10/03/18 1017  BP: 134/64 (!) 141/64 (!) 141/58 (!) 127/49  Pulse: 60 (!) 59 (!) 56 (!) 55  Resp:  20 14   Temp:  98.8 F (37.1 C)    TempSrc:  Oral    SpO2:  99% 99%   Weight:  60.6 kg    Height:        Intake/Output Summary (Last 24 hours) at 10/03/2018 1607 Last data filed at 10/03/2018 1300 Gross per 24 hour  Intake 1032.17 ml  Output 1350 ml  Net -317.83 ml   Filed Weights   10/01/18 0207 10/02/18 0440 10/03/18 0521  Weight: 59.6 kg 60.3 kg 60.6 kg    Exam  General: Well developed, elderly, NAD, younger than stated age  42: NCAT, mucous membranes moist.   Neck: Supple  Cardiovascular: S1 S2 auscultated, soft SEM, RRR  Respiratory: Diminished breath sounds however clear   Abdomen: Soft, nontender, mildly distended, + bowel sounds  Extremities: warm dry without cyanosis clubbing or edema  Neuro: AAOx3, nonfocal  Psych:Appropriate mood and affect, pleasant    Data Reviewed: I have personally reviewed  following labs and imaging studies  CBC: Recent Labs  Lab 09/30/18 1928 10/01/18 0643 10/02/18 0035 10/03/18 0327  WBC 11.5* 11.4* 12.9* 12.6*  NEUTROABS 8.3*  --   --   --   HGB 12.9 11.6* 11.1* 10.4*  HCT 41.8 36.9 35.9* 34.1*  MCV 99.1 96.9 98.4 99.4  PLT 257 215 222 811   Basic Metabolic Panel: Recent Labs  Lab 09/30/18 1928 10/01/18 0643 10/02/18 0035 10/03/18 0327  NA 129* 139 140 137  K 5.4* 4.8 4.8 5.0  CL 93* 105 104 101  CO2 23 23 23 28   GLUCOSE 328* 163* 179* 187*  BUN 51* 43* 30* 22  CREATININE 2.16* 1.80* 1.49* 1.25*  CALCIUM 8.2* 8.1* 8.3* 8.8*   GFR: Estimated Creatinine Clearance: 23.8 mL/min (A) (by C-G formula based on SCr of 1.25 mg/dL (H)). Liver Function Tests: Recent Labs  Lab 09/30/18 1928  AST 26  ALT 24  ALKPHOS 131*  BILITOT 0.3  PROT 7.3  ALBUMIN 4.3   Recent Labs  Lab 09/30/18 1928  LIPASE 20   No results for input(s): AMMONIA in the last 168 hours. Coagulation Profile: Recent Labs  Lab 09/30/18 1928  INR 1.0   Cardiac Enzymes: Recent Labs  Lab 10/01/18 0643 10/01/18 1255 10/01/18 1902 10/02/18 0035 10/02/18 0720  TROPONINI 0.18* 0.96* 3.00* 2.85*  2.70*   BNP (last 3 results) No results for input(s): PROBNP in the last 8760 hours. HbA1C: No results for input(s): HGBA1C in the last 72 hours. CBG: Recent Labs  Lab 10/02/18 1205 10/02/18 1805 10/02/18 2056 10/03/18 0811 10/03/18 1143  GLUCAP 193* 256* 286* 154* 206*   Lipid Profile: No results for input(s): CHOL, HDL, LDLCALC, TRIG, CHOLHDL, LDLDIRECT in the last 72 hours. Thyroid Function Tests: No results for input(s): TSH, T4TOTAL, FREET4, T3FREE, THYROIDAB in the last 72 hours. Anemia Panel: No results for input(s): VITAMINB12, FOLATE, FERRITIN, TIBC, IRON, RETICCTPCT in the last 72 hours. Urine analysis:    Component Value Date/Time   COLORURINE STRAW (A) 10/01/2018 2340   APPEARANCEUR CLEAR 10/01/2018 2340   LABSPEC 1.012 10/01/2018 2340    PHURINE 5.0 10/01/2018 2340   GLUCOSEU NEGATIVE 10/01/2018 2340   HGBUR NEGATIVE 10/01/2018 2340   BILIRUBINUR NEGATIVE 10/01/2018 2340   BILIRUBINUR Neg 05/30/2017 1329   KETONESUR NEGATIVE 10/01/2018 2340   PROTEINUR NEGATIVE 10/01/2018 2340   UROBILINOGEN 0.2 05/30/2017 1329   UROBILINOGEN 0.2 12/27/2010 0505   NITRITE NEGATIVE 10/01/2018 2340   LEUKOCYTESUR NEGATIVE 10/01/2018 2340   Sepsis Labs: @LABRCNTIP (procalcitonin:4,lacticidven:4)  ) Recent Results (from the past 240 hour(s))  Blood culture (routine x 2)     Status: None (Preliminary result)   Collection Time: 09/30/18 11:45 PM  Result Value Ref Range Status   Specimen Description BLOOD RIGHT ARM  Final   Special Requests   Final    BOTTLES DRAWN AEROBIC AND ANAEROBIC Blood Culture adequate volume   Culture   Final    NO GROWTH 2 DAYS Performed at [facility], [place] [address] [place], [place] [address]    Report Status PENDING  Incomplete  Blood culture (routine x 2)     Status: None (Preliminary result)   Collection Time: 09/30/18 11:55 PM  Result Value Ref Range Status   Specimen Description BLOOD LEFT WRIST  Final   Special Requests   Final    BOTTLES DRAWN AEROBIC AND ANAEROBIC Blood Culture adequate volume   Culture   Final    NO GROWTH 2 DAYS Performed at [facility], [place] [address] [place], [place] [address]    Report Status PENDING  Incomplete      Radiology Studies: Dg Chest Port 1 View  Result Date: 10/01/2018 CLINICAL DATA:  85 year old female with lung crackles on physical exam EXAM: PORTABLE CHEST 1 VIEW COMPARISON:  Prior chest x-ray and CT scan of the chest 09/30/2018 FINDINGS: Stable cardiac and mediastinal contours. Atherosclerotic calcifications again noted in the transverse aorta. No evidence of pulmonary edema, new airspace consolidation, pleural effusion or pneumothorax. Inspiratory volumes are slightly low. No acute osseous abnormality. IMPRESSION: Stable chest x-ray without  evidence of new acute cardiopulmonary process. Aortic Atherosclerosis (ICD10-170.0). Electronically Signed   By: Jacqulynn Cadet M.D.   On: 10/01/2018 16:17     Scheduled Meds: . ALPRAZolam  1 mg Oral QHS  . amLODipine  2.5 mg Oral Daily  . aspirin EC  81 mg Oral Daily  . atorvastatin  10 mg Oral q1800  . gabapentin  200 mg Oral QHS  . insulin aspart  0-15 Units Subcutaneous TID WC  . insulin aspart  0-5 Units Subcutaneous QHS  . insulin glargine  36 Units Subcutaneous QHS  . levothyroxine  125 mcg Oral Q0600  . metoprolol tartrate  25 mg Oral BID  . pantoprazole  40 mg Oral Daily  . polyethylene glycol  17 g Oral Daily   Continuous Infusions: . heparin 800 Units/hr (10/03/18 1016)  . nitroGLYCERIN 35 mcg/min (10/03/18 1027)     LOS: 2 days   Time Spent in minutes   45 minutes (greater than 50% of time spent with patient face to face, as well as reviewing old records, calling consults, and formulating a plan)  Cristal Ford D.O. on 10/03/2018 at 4:07 PM  Between 7am to 7pm - Please see pager noted on [email or web address]  After 7pm go to [email or web address]  And look for the night coverage person covering for me after hours  [facility] Office  [phone]

## 2018-10-03 NOTE — Progress Notes (Signed)
[facility] for heparin Indication: chest pain/ACS  Allergies  Allergen Reactions  . Celebrex [Celecoxib] Rash  . Cymbalta [Duloxetine Hcl] Rash    Lip numbness,rash, leg and body jerks. Patient states " I though I was having a heart attack or stroke."   . Doxycycline Rash  . Lyrica [Pregabalin] Rash  . Avandia [Rosiglitazone] Rash  . Macrodantin [Nitrofurantoin Macrocrystal] Rash  . Silicone Rash    Gets rash from the touch it.  . Sulfa Antibiotics Rash  . Vioxx [Rofecoxib] Rash    Patient Measurements: Height: 5\' 3"  (160 cm) Weight: 133 lb 9.6 oz (60.6 kg) IBW/kg (Calculated) : 52.4 Heparin Dosing Weight: 60kg  Vital Signs: Temp: 98.8 F (37.1 C) (03/04 0521) Temp Source: Oral (03/04 0521) BP: 141/64 (03/04 0521) Pulse Rate: 59 (03/04 0521)  Labs: Recent Labs    09/30/18 1928  10/01/18 1245  10/01/18 1643 10/01/18 1902 10/02/18 0035 10/02/18 0720 10/02/18 0924 10/03/18 0327  HGB 12.9  --  11.6*  --   --   --  11.1*  --   --  10.4*  HCT 41.8  --  36.9  --   --   --  35.9*  --   --  34.1*  PLT 257  --  215  --   --   --  222  --   --  205  LABPROT 13.3  --   --   --   --   --   --   --   --   --   INR 1.0  --   --   --   --   --   --   --   --   --   HEPARINUNFRC  --   --   --   --  0.57  --   --   --  0.47 0.29*  CREATININE 2.16*  --  1.80*  --   --   --  1.49*  --   --  1.25*  TROPONINI 0.06*   < > 0.18*   < >  --  3.00* 2.85* 2.70*  --   --    < > = values in this interval not displayed.    Estimated Creatinine Clearance: 23.8 mL/min (A) (by C-G formula based on SCr of 1.25 mg/dL (H)).   Medical History: Past Medical History:  Diagnosis Date  . A-fib ([place])   . Abnormal involuntary movements(781.0)   . Anemia   . Anxiety   . Anxiety state, unspecified   . Arthritis    body, neck  . Benign hypertensive heart disease   . Benign hypertensive heart disease with congestive heart failure ([place])   . Bladder disorder     over active  . Cancer ([place])    skin, removed  . Cervicalgia   . CHF (congestive heart failure) ([place])   . Chronic kidney disease, stage III (moderate) (H[facility])   . Congestive heart failure, unspecified   . Contact dermatitis and other eczema, due to unspecified cause   . Corns and callosities   . Diabetes mellitus without complication ([place])   . Diarrhea   . Dyspnea 02/18/2015  . Edema   . Esophageal stricture   . Gastroparesis   . Gastroparesis   . GERD (gastroesophageal reflux disease)   . Hiatal hernia   . Hyperlipidemia   . Inflamed seborrheic keratosis   . Inflammatory disease of breast   . Irregular heartbeat   .  Lumbago   . Mixed incontinence urge and stress (female)(female)   . Nausea alone   . Nonspecific (abnormal) findings on radiological and other examination of skull and head   . Osteoarthrosis, unspecified whether generalized or localized, unspecified site   . Other and unspecified hyperlipidemia   . Other B-complex deficiencies   . Other functional disorder of bladder   . Other malaise and fatigue   . Other specified visual disturbances   . Pain in joint, lower leg   . Palpitations   . Peripheral vascular disease, unspecified ([place])   . Postmenopausal atrophic vaginitis   . Reflux esophagitis   . Seborrheic keratosis   . Sleep apnea   . Spasm of muscle   . Spinal stenosis   . Spinal stenosis, unspecified region other than cervical   . Stricture and stenosis of esophagus   . Stroke ([place])   . TIA (transient ischemic attack) 2012  . Transient ischemic attack (TIA), and cerebral infarction without residual deficits(V12.54)   . Type II or unspecified type diabetes mellitus with renal manifestations, not stated as uncontrolled(250.40)   . Unspecified constipation   . Unspecified essential hypertension   . Unspecified hereditary and idiopathic peripheral neuropathy   . Unspecified hypothyroidism   . Unspecified pruritic disorder   . Unspecified sleep apnea   .  Unspecified vitamin D deficiency   . Urinary tract infection, site not specified   . Vitamin B12 deficiency    Assessment: 85 year old female with chest pain and cardiac enzymes trending up. Orders to start IV heparin. Patient does not appear to be on anticoagulation prior to admit. CBC ok.   Heparin level below goal at 0.29, hgb trending down at 10.4. No bleeding noted. Scr continues to trend down, patient declined cath on admit.   48 hours of therapy would end this morning, will discuss plans with cardiology but will not make rate adjustments at this time.   Goal of Therapy:  Heparin level 0.3-0.7 units/ml Monitor platelets by anticoagulation protocol: Yes   Plan:  Continue heparin gtt at 750 units/hr - assume 48 hours of therapy; will follow up  Daily heparin level, CBC, s/s bleeding  Erin Hearing PharmD., BCPS Clinical Pharmacist 10/03/2018 8:22 AM

## 2018-10-03 NOTE — Evaluation (Signed)
Physical Therapy Evaluation Patient Details Name: Karen [name] MRN: [id number] DOB: 10/14/1923 Today's Date: 10/03/2018   History of Present Illness  Pt is a 85 y.o. female admitted 09/30/18 with inferior wall STEMI. Plan for medical management given comorbidities. PMH includes HTN, DM, VPD, stroke, afib, CHF, anxiety.    Clinical Impression  Pt presents with an overall decrease in functional mobility secondary to above. PTA, pt mod indep with rollator, lives alone. Today, pt required close min guard to ambulate short distance with RW; limited by generalized weakness and decreased activity tolerance; at high risk for fall. SpO2 >95% on RA. Family unsure if they would be able to provide initial 24/7 support to allow pt to return home safely, therefore recommending SNF-level therapies to maximize functional mobility and independence prior to returning home. Pt motivated to regain independence.    Follow Up Recommendations SNF;Supervision for mobility/OOB (versus HHPT with 24/7 family support)    Equipment Recommendations  Rolling walker with 5" wheels    Recommendations for Other Services       Precautions / Restrictions Precautions Precautions: Fall Restrictions Weight Bearing Restrictions: No      Mobility  Bed Mobility Overal bed mobility: Needs Assistance Bed Mobility: Sit to Supine       Sit to supine: Min assist   General bed mobility comments: MinA to assist BLEs into bed  Transfers Overall transfer level: Needs assistance Equipment used: Rolling walker (2 wheeled) Transfers: Sit to/from Stand Sit to Stand: Min guard         General transfer comment: Able to perform multiple sit<>stands from bed, chair, and BSC with RW and min guard; required 1x cue for correct hand placement, able to carry this over to all other trials  Ambulation/Gait Ambulation/Gait assistance: Min guard Gait Distance (Feet): 60 Feet Assistive device: Rolling walker (2 wheeled) Gait  Pattern/deviations: Step-through pattern;Decreased stride length;Trunk flexed Gait velocity: Decreased Gait velocity interpretation: <1.8 ft/sec, indicate of risk for recurrent falls General Gait Details: Slow, steady gait with RW and min guard for balance; cues for upright posture as pt forward flexed with forward head. Able to additionally stand and take steps bed<>BSC with out UE support and min guard  Stairs            Wheelchair Mobility    Modified Rankin (Stroke Patients Only)       Balance Overall balance assessment: Needs assistance   Sitting balance-Leahy Scale: Good Sitting balance - Comments: Indep with pericare sitting on BSC     Standing balance-Leahy Scale: Fair Standing balance comment: Can static stand without UE support; dynamic stability improved with UE support                             Pertinent Vitals/Pain Pain Assessment: Faces Faces Pain Scale: Hurts a little bit Pain Location: R shoulder/upper chest Pain Descriptors / Indicators: Sore;Discomfort Pain Intervention(s): Monitored during session    Home Living Family/patient expects to be discharged to:: Private residence Living Arrangements: Alone Available Help at Discharge: Family;Available PRN/intermittently Type of Home: House Home Access: Ramped entrance     Home Layout: One level Home Equipment: [place] - 4 wheels;Wheelchair - manual;Cane - single point      Prior Function Level of Independence: Needs assistance   Gait / Transfers Assistance Needed: Ambulatory with rollator; uses w/c at night when tired, able to push with feet/hands. Reports 3 falls in the past 3 months. Family recently took car  away so pt cannot drive  ADL's / Homemaking Assistance Needed: Indep with ADLs, but reports "I need more help because it takes me two hour." Waiting for [employer] to start  Comments: Wears Life Alert button     Hand Dominance        Extremity/Trunk Assessment    Upper Extremity Assessment Upper Extremity Assessment: Overall WFL for tasks assessed    Lower Extremity Assessment Lower Extremity Assessment: Generalized weakness    Cervical / Trunk Assessment Cervical / Trunk Assessment: Kyphotic  Communication   Communication: No difficulties  Cognition Arousal/Alertness: Awake/alert Behavior During Therapy: WFL for tasks assessed/performed Overall Cognitive Status: Within Functional Limits for tasks assessed                                 General Comments: WFL for simple tasks; likely baseline cognition      General Comments General comments (skin integrity, edema, etc.): Daughter-in-law present and supportive    Exercises     Assessment/Plan    PT Assessment Patient needs continued PT services  PT Problem List Decreased strength;Decreased activity tolerance;Decreased balance;Decreased mobility;Decreased knowledge of use of DME;Cardiopulmonary status limiting activity       PT Treatment Interventions DME instruction;Gait training;Stair training;Functional mobility training;Therapeutic activities;Therapeutic exercise;Balance training;Patient/family education    PT Goals (Current goals can be found in the Care Plan section)  Acute Rehab PT Goals Patient Stated Goal: Patient prefers to return home with [facility] instead of PT at SNF PT Goal Formulation: With patient/family Time For Goal Achievement: 10/17/18 Potential to Achieve Goals: Fair    Frequency Min 3X/week   Barriers to discharge Decreased caregiver support      Co-evaluation               AM-PAC PT "6 Clicks" Mobility  Outcome Measure Help needed turning from your back to your side while in a flat bed without using bedrails?: A Little Help needed moving from lying on your back to sitting on the side of a flat bed without using bedrails?: A Little Help needed moving to and from a bed to a chair (including a wheelchair)?: A Little Help needed standing up  from a chair using your arms (e.g., wheelchair or bedside chair)?: A Little Help needed to walk in hospital room?: A Little Help needed climbing 3-5 steps with a railing? : A Little 6 Click Score: 18    End of Session Equipment Utilized During Treatment: Gait belt Activity Tolerance: Patient tolerated treatment well Patient left: in bed;with call bell/phone within reach;with family/visitor present Nurse Communication: Mobility status PT Visit Diagnosis: Other abnormalities of gait and mobility (R26.89);Muscle weakness (generalized) (M62.81);History of falling (Z91.81)    Time: 8657-8469 PT Time Calculation (min) (ACUTE ONLY): 40 min   Charges:   PT Evaluation $PT Eval Moderate Complexity: 1 Mod PT Treatments $Gait Training: 8-22 mins $Self Care/Home Management: 8-22      Mabeline Caras, PT, DPT Acute Rehabilitation Services  Pager [phone] Office [place] 10/03/2018, 3:38 PM

## 2018-10-04 DIAGNOSIS — Z515 Encounter for palliative care: Secondary | ICD-10-CM

## 2018-10-04 DIAGNOSIS — Z7189 Other specified counseling: Secondary | ICD-10-CM

## 2018-10-04 LAB — CBC
HCT: 32.4 % — ABNORMAL LOW (ref 36.0–46.0)
Hemoglobin: 10.2 g/dL — ABNORMAL LOW (ref 12.0–15.0)
MCH: 30.9 pg (ref 26.0–34.0)
MCHC: 31.5 g/dL (ref 30.0–36.0)
MCV: 98.2 fL (ref 80.0–100.0)
Platelets: 182 10*3/uL (ref 150–400)
RBC: 3.3 MIL/uL — ABNORMAL LOW (ref 3.87–5.11)
RDW: 12.2 % (ref 11.5–15.5)
WBC: 12.9 10*3/uL — ABNORMAL HIGH (ref 4.0–10.5)
nRBC: 0 % (ref 0.0–0.2)

## 2018-10-04 LAB — BASIC METABOLIC PANEL
Anion gap: 7 (ref 5–15)
BUN: 25 mg/dL — ABNORMAL HIGH (ref 8–23)
CO2: 27 mmol/L (ref 22–32)
Calcium: 8.7 mg/dL — ABNORMAL LOW (ref 8.9–10.3)
Chloride: 100 mmol/L (ref 98–111)
Creatinine, Ser: 1.45 mg/dL — ABNORMAL HIGH (ref 0.44–1.00)
GFR calc Af Amer: 36 mL/min — ABNORMAL LOW (ref >60)
GFR calc non Af Amer: 31 mL/min — ABNORMAL LOW (ref >60)
Glucose, Bld: 211 mg/dL — ABNORMAL HIGH (ref 70–99)
Potassium: 5.2 mmol/L — ABNORMAL HIGH (ref 3.5–5.1)
Sodium: 134 mmol/L — ABNORMAL LOW (ref 135–145)

## 2018-10-04 LAB — GLUCOSE, CAPILLARY
Glucose-Capillary: 172 mg/dL — ABNORMAL HIGH (ref 70–99)
Glucose-Capillary: 213 mg/dL — ABNORMAL HIGH (ref 70–99)
Glucose-Capillary: 228 mg/dL — ABNORMAL HIGH (ref 70–99)
Glucose-Capillary: 242 mg/dL — ABNORMAL HIGH (ref 70–99)
Glucose-Capillary: 276 mg/dL — ABNORMAL HIGH (ref 70–99)

## 2018-10-04 LAB — HEPARIN LEVEL (UNFRACTIONATED): Heparin Unfractionated: 0.34 IU/mL (ref 0.30–0.70)

## 2018-10-04 MED ORDER — SACCHAROMYCES BOULARDII 250 MG PO CAPS
250.0000 mg | ORAL_CAPSULE | Freq: Two times a day (BID) | ORAL | Status: DC
  Administered 2018-10-04 – 2018-10-08 (×9): 250 mg via ORAL
  Filled 2018-10-04 (×9): qty 1

## 2018-10-04 MED ORDER — ISOSORBIDE MONONITRATE ER 30 MG PO TB24
30.0000 mg | ORAL_TABLET | Freq: Every day | ORAL | Status: DC
  Administered 2018-10-04 – 2018-10-08 (×5): 30 mg via ORAL
  Filled 2018-10-04 (×5): qty 1

## 2018-10-04 MED ORDER — BISACODYL 10 MG RE SUPP
10.0000 mg | Freq: Every day | RECTAL | Status: DC | PRN
  Administered 2018-10-05: 10 mg via RECTAL
  Filled 2018-10-04 (×2): qty 1

## 2018-10-04 MED ORDER — INSULIN ASPART 100 UNIT/ML ~~LOC~~ SOLN
2.0000 [IU] | Freq: Three times a day (TID) | SUBCUTANEOUS | Status: DC
  Administered 2018-10-04 – 2018-10-08 (×12): 2 [IU] via SUBCUTANEOUS

## 2018-10-04 MED ORDER — ALUM & MAG HYDROXIDE-SIMETH 200-200-20 MG/5ML PO SUSP
30.0000 mL | Freq: Four times a day (QID) | ORAL | Status: DC | PRN
  Administered 2018-10-04: 30 mL via ORAL
  Filled 2018-10-04: qty 30

## 2018-10-04 MED ORDER — MORPHINE SULFATE (CONCENTRATE) 10 MG/0.5ML PO SOLN
10.0000 mg | ORAL | Status: DC | PRN
  Administered 2018-10-04 – 2018-10-08 (×5): 10 mg via SUBLINGUAL
  Filled 2018-10-04 (×5): qty 0.5

## 2018-10-04 MED ORDER — SENNOSIDES-DOCUSATE SODIUM 8.6-50 MG PO TABS
2.0000 | ORAL_TABLET | Freq: Once | ORAL | Status: AC
  Administered 2018-10-04: 2 via ORAL
  Filled 2018-10-04: qty 2

## 2018-10-04 MED ORDER — METOPROLOL TARTRATE 12.5 MG HALF TABLET
12.5000 mg | ORAL_TABLET | Freq: Two times a day (BID) | ORAL | Status: DC
  Administered 2018-10-04 – 2018-10-08 (×8): 12.5 mg via ORAL
  Filled 2018-10-04 (×8): qty 1

## 2018-10-04 NOTE — Evaluation (Signed)
Occupational Therapy Evaluation Patient Details Name: Karen [name] MRN: [id number] DOB: [birth year] Today's Date: 10/04/2018    History of Present Illness Pt is a 85 y.o. female admitted 09/30/18 with inferior wall STEMI. Plan for medical management given comorbidities. PMH includes HTN, DM, VPD, stroke, afib, CHF, anxiety.   Clinical Impression   Pt PTA: pt living home alone- family visiting daily and calling daily. Pt performing ADL with independence and wore life alert. Pt currently, limited by chest pain that is believed to have do with anxiety at this time per RN. Pt performing bed mobility with modA overall; transfers with modA overall and ADL functional mobility ~15' x2 with RW with very short steps. Pt would benefit from continued OT skilled services for ADL, mobility and safety in SNF setting with 24/7 care. OT to focus on energy conservation and ADL.  OT to follow acutely.  O2 levels >94% on RA post exertion.    Follow Up Recommendations  SNF;Supervision/Assistance - 24 hour    Equipment Recommendations  None recommended by OT    Recommendations for Other Services       Precautions / Restrictions Precautions Precautions: Fall Restrictions Weight Bearing Restrictions: No      Mobility Bed Mobility Overal bed mobility: Needs Assistance Bed Mobility: Sit to Supine;Supine to Sit     Supine to sit: Mod assist;HOB elevated(for trunk elevation) Sit to supine: Mod assist;HOB elevated      Transfers Overall transfer level: Needs assistance Equipment used: Rolling walker (2 wheeled) Transfers: Sit to/from Stand Sit to Stand: Min assist;From elevated surface         General transfer comment: hand placement cues    Balance Overall balance assessment: Needs assistance   Sitting balance-Leahy Scale: Good       Standing balance-Leahy Scale: Fair                             ADL either performed or assessed with clinical judgement   ADL Overall  ADL's : Needs assistance/impaired Eating/Feeding: Supervision/ safety;Sitting   Grooming: Minimal assistance;Sitting   Upper Body Bathing: Minimal assistance;Sitting   Lower Body Bathing: Moderate assistance;Sitting/lateral leans;Sit to/from stand   Upper Body Dressing : Minimal assistance;Sitting;Standing   Lower Body Dressing: Moderate assistance;Sitting/lateral leans;Sit to/from stand   Toilet Transfer: Moderate assistance;BSC   Toileting- Clothing Manipulation and Hygiene: Moderate assistance;Sitting/lateral lean;Sit to/from stand       Functional mobility during ADLs: Moderate assistance;Rolling walker(R foot weak) General ADL Comments: ModA overall     Vision Baseline Vision/History: Wears glasses Vision Assessment?: No apparent visual deficits     Perception     Praxis      Pertinent Vitals/Pain Pain Assessment: 0-10 Pain Score: 3  Pain Location: R shoulder/upper chest Pain Descriptors / Indicators: Sore;Discomfort Pain Intervention(s): Premedicated before session;Repositioned     Hand Dominance     Extremity/Trunk Assessment Upper Extremity Assessment Upper Extremity Assessment: Generalized weakness   Lower Extremity Assessment Lower Extremity Assessment: Defer to PT evaluation   Cervical / Trunk Assessment Cervical / Trunk Assessment: Kyphotic   Communication Communication Communication: No difficulties   Cognition Arousal/Alertness: Awake/alert Behavior During Therapy: WFL for tasks assessed/performed Overall Cognitive Status: Within Functional Limits for tasks assessed                                     General Comments  daughter  present    Exercises     Shoulder Instructions      Home Living Family/patient expects to be discharged to:: Private residence Living Arrangements: Alone Available Help at Discharge: Family;Available PRN/intermittently Type of Home: House Home Access: Ramped entrance     Home Layout: One  level     Bathroom Shower/Tub: Occupational psychologist: Handicapped height     Home Equipment: Environmental consultant - 4 wheels;Wheelchair - manual;Cane - single point          Prior Functioning/Environment Level of Independence: Needs assistance  Gait / Transfers Assistance Needed: Ambulatory with rollator; uses w/c at night when tired, able to push with feet/hands. Reports 3 falls in the past 3 months. Family recently took car away so pt cannot drive ADL's / Homemaking Assistance Needed: Indep with ADLs, but reports "I need more help because it takes me two hour." Waiting for Meals-on-Wheels to start   Comments: Wears Life Alert button        OT Problem List: Decreased strength;Impaired balance (sitting and/or standing);Decreased activity tolerance;Pain;Decreased safety awareness      OT Treatment/Interventions: Self-care/ADL training;Therapeutic exercise;Neuromuscular education;Energy conservation;DME and/or AE instruction;Therapeutic activities;Balance training;Patient/family education    OT Goals(Current goals can be found in the care plan section) Acute Rehab OT Goals Patient Stated Goal: to feel better at home OT Goal Formulation: With patient Time For Goal Achievement: 10/18/18 Potential to Achieve Goals: Good ADL Goals Pt Will Perform Lower Body Dressing: with set-up;sitting/lateral leans;sit to/from stand;with adaptive equipment Pt Will Transfer to Toilet: with modified independence;bedside commode Pt Will Perform Toileting - Clothing Manipulation and hygiene: with supervision;sitting/lateral leans;sit to/from stand Additional ADL Goal #1: Pt will perform ADL functional mobility and ADL functional transfers with SupervisionA and least restrictive device  OT Frequency: Min 2X/week   Barriers to D/C: Decreased caregiver support          Co-evaluation              AM-PAC OT "6 Clicks" Daily Activity     Outcome Measure Help from another person eating meals?:  None Help from another person taking care of personal grooming?: A Little Help from another person toileting, which includes using toliet, bedpan, or urinal?: A Lot Help from another person bathing (including washing, rinsing, drying)?: A Lot Help from another person to put on and taking off regular upper body clothing?: A Little Help from another person to put on and taking off regular lower body clothing?: A Lot 6 Click Score: 16   End of Session Equipment Utilized During Treatment: Gait belt;Rolling walker Nurse Communication: Mobility status  Activity Tolerance: Patient limited by pain;Patient tolerated treatment well Patient left: in bed;with call bell/phone within reach;with family/visitor present  OT Visit Diagnosis: Unsteadiness on feet (R26.81);Muscle weakness (generalized) (M62.81)                Time: 1062-6948 OT Time Calculation (min): 40 min Charges:  OT General Charges $OT Visit: 1 Visit OT Evaluation $OT Eval Moderate Complexity: 1 Mod OT Treatments $Self Care/Home Management : 8-22 mins $Neuromuscular Re-education: 8-22 mins  Darryl Nestle) Marsa Aris OTR/L Acute Rehabilitation Services Pager: [phone] Office: [phone]  Fredda Hammed 10/04/2018, 5:07 PM

## 2018-10-04 NOTE — Consult Note (Addendum)
Consultation Note Date: 10/04/2018   Patient Name: Karen [name]  DOB: 08-15-1923  MRN: [id number]  Age / Sex: 85 y.o., female  PCP: No primary care provider on file. Referring Physician: Cristal Ford, DO  Reason for Consultation: Establishing goals of care and Psychosocial/spiritual support  HPI/Patient Profile: 85 y.o. female  with past medical history of IDDM, CKD IV, OSA, afib, TIA/CVA, anxiety, esophageal stenosis s/p dilation, and diastolic heart failure who was admitted on 09/30/2018 with STEMI.  The patient had been having right sided chest pain and indigestion for 2 days.    Of note Karen [name] was living at home fairly independently and she is opioid and benzodiazepine dependent.  She recently spent time at [facility] after a fall in November.  Clinical Assessment and Goals of Care:  I have reviewed medical records including EPIC notes, labs and imaging, received report from the care team, assessed the patient and then met at the bedside initially with her DIL Karen [name] (cardiac RN) and subsquently with Karen [name], and Karen [name] in a second meeting  to discuss diagnosis prognosis, [place], EOL wishes, disposition and options.  I introduced Palliative Medicine as specialized medical care for people living with serious illness. It focuses on providing relief from the symptoms and stress of a serious illness. The goal is to improve quality of life for both the patient and the family.  We discussed a brief life review of the patient.  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  She lost one son in December 2018 at age 16 (this was extremely difficult for her).  She is very much a people person and loves her church.  In the last 6 months her son stopped her from driving on her own and she has become more isolated.  Her  family feels this has caused some depression.  As far as functional and nutritional status the patient is able to walk with a rolling walker but is a high fall risk.  She reports her appetite has declined but she still "grazes".  She states her clothes are fitting more loosely these days.  We discussed her current illness and what it means in the larger context of her on-going co-morbidities.  Natural disease trajectory and expectations at EOL were discussed.  Karen [name] understands that she is in the final phase of life.  She states "I'm ready to go at any time".  However she also feels good enough that she wants to continue to return to the hospital for care.  I attempted to elicit values and goals of care important to the patient.  She prioritizes being with people and being social.  When asked about her minimum level of functionality that would be acceptable vs when she would want to stay home and be kept comfortable with Hospice.  The states as long as she can enjoy interactions with her family and her church family she would want to continue to seek  medical care at the hospital.  Advanced directives, concepts specific to code status, artifical feeding and hydration, and rehospitalization were considered and discussed.  She is a DNR/DNI.  She would not want hemodialysis.  She would not want a feeding tube.  At this point she is open to re-hospitalization.  Hospice and Palliative Care services outpatient were explained and offered. We discussed Hospice with Karen [name] and Almyra Free at length. The patient would like for Palliative to follow her at [facility].  Questions and concerns were addressed.  Hard Choices booklet left for review. The family was encouraged to call with questions or concerns.    Primary Decision Maker:  PATIENT  Her HCPOA if needed is her son, Karen [name].    SUMMARY OF RECOMMENDATIONS    1.  Will start probiotic and add senna at family's request. 2.  Continue chronic  opioid and benzodiazepine medications (hydrocodone and alprazolam) as they were PTA. 3.  Would prescribe morphine sublingual as an emergency medication for her anginal equivalent (Mophine SL 10 mg PRN chest pain).  Limited supply with no refills. 4.  DC to [facility] (if possible) for PT / OT rehab. 5.  Please add "Palliative Care to follow at SNF" into discharge instructions on DC summary.  PMT will sign off.  Please call the office if we can be of further assistance.   Code Status/Advance Care Planning:  DNR  Symptom Management:   Morphine SL  Probiotic, senna  Additional Recommendations (Limitations, Scope, Preferences):  No Surgical Procedures.   Treat the treatable with minimally invasive measures.  Palliative Prophylaxis:   Frequent Pain Assessment  Psycho-social/Spiritual:   Desire for further Chaplaincy support:  Patients pastor is present  Prognosis:  Likely less than 6 months given STEMI x 2 this hospitalization with no corrective measures taken - and on-going CP, recent fall, decreased mobility, decreased appetite, stage IV CKD, previous MI, CVA, IDDM, and lung nodules that are suspicious for MAI infection (without other systemic signs of infection).    Discharge Planning: [place] for rehab with Palliative care service follow-up      Primary Diagnoses: Present on Admission: . Chest pain . Hyperkalemia   I have reviewed the medical record, interviewed the patient and family, and examined the patient. The following aspects are pertinent.  Past Medical History:  Diagnosis Date  . A-fib ([place])   . Abnormal involuntary movements(781.0)   . Anemia   . Anxiety   . Anxiety state, unspecified   . Arthritis    body, neck  . Benign hypertensive heart disease   . Benign hypertensive heart disease with congestive heart failure ([place])   . Bladder disorder    over active  . Cancer ([place])    skin, removed  . Cervicalgia   . CHF (congestive  heart failure) ([place])   . Chronic kidney disease, stage III (moderate) (HCC)   . Congestive heart failure, unspecified   . Contact dermatitis and other eczema, due to unspecified cause   . Corns and callosities   . Diabetes mellitus without complication ([place])   . Diarrhea   . Dyspnea 02/18/2015  . Edema   . Esophageal stricture   . Gastroparesis   . Gastroparesis   . GERD (gastroesophageal reflux disease)   . Hiatal hernia   . Hyperlipidemia   . Inflamed seborrheic keratosis   . Inflammatory disease of breast   . Irregular heartbeat   . Lumbago   . Mixed incontinence urge and stress (female)(female)   .  Nausea alone   . Nonspecific (abnormal) findings on radiological and other examination of skull and head   . Osteoarthrosis, unspecified whether generalized or localized, unspecified site   . Other and unspecified hyperlipidemia   . Other B-complex deficiencies   . Other functional disorder of bladder   . Other malaise and fatigue   . Other specified visual disturbances   . Pain in joint, lower leg   . Palpitations   . Peripheral vascular disease, unspecified ([place])   . Postmenopausal atrophic vaginitis   . Reflux esophagitis   . Seborrheic keratosis   . Sleep apnea   . Spasm of muscle   . Spinal stenosis   . Spinal stenosis, unspecified region other than cervical   . Stricture and stenosis of esophagus   . Stroke ([place])   . TIA (transient ischemic attack) 2012  . Transient ischemic attack (TIA), and cerebral infarction without residual deficits(V12.54)   . Type II or unspecified type diabetes mellitus with renal manifestations, not stated as uncontrolled(250.40)   . Unspecified constipation   . Unspecified essential hypertension   . Unspecified hereditary and idiopathic peripheral neuropathy   . Unspecified hypothyroidism   . Unspecified pruritic disorder   . Unspecified sleep apnea   . Unspecified vitamin D deficiency   . Urinary tract infection, site not specified   .  Vitamin B12 deficiency    Social History   Socioeconomic History  . Marital status: Widowed    Spouse name: Not on file  . Number of children: 3  . Years of education: Not on file  . Highest education level: Not on file  Occupational History  . Occupation: retired  Scientific laboratory technician  . Financial resource strain: Not hard at all  . Food insecurity:    Worry: Never true    Inability: Never true  . Transportation needs:    Medical: No    Non-medical: No  Tobacco Use  . Smoking status: Never Smoker  . Smokeless tobacco: Never Used  Substance and Sexual Activity  . Alcohol use: No  . Drug use: No  . Sexual activity: Never  Lifestyle  . Physical activity:    Days per week: 0 days    Minutes per session: 0 min  . Stress: Rather much  Relationships  . Social connections:    Talks on phone: More than three times a week    Gets together: More than three times a week    Attends religious service: More than 4 times per year    Active member of club or organization: Yes    Attends meetings of clubs or organizations: Never    Relationship status: Widowed  Other Topics Concern  . Not on file  Social History Narrative  . Not on file   Family History  Problem Relation Age of Onset  . Cancer Mother        ? stomach  . Heart disease Father   . Hyperlipidemia Father   . Hypertension Father   . Heart attack Father   . Heart disease Brother   . Kidney disease Daughter   . Heart disease Brother   . Cancer Son    Scheduled Meds: . ALPRAZolam  1 mg Oral QHS  . amLODipine  2.5 mg Oral Daily  . aspirin EC  81 mg Oral Daily  . atorvastatin  10 mg Oral q1800  . gabapentin  200 mg Oral QHS  . insulin aspart  0-15 Units Subcutaneous TID WC  . insulin  aspart  0-5 Units Subcutaneous QHS  . insulin aspart  2 Units Subcutaneous TID WC  . insulin glargine  36 Units Subcutaneous QHS  . isosorbide mononitrate  30 mg Oral Daily  . levothyroxine  125 mcg Oral Q0600  . metoprolol tartrate   12.5 mg Oral BID  . pantoprazole  40 mg Oral Daily  . polyethylene glycol  17 g Oral Daily  . saccharomyces boulardii  250 mg Oral BID   Continuous Infusions: PRN Meds:.acetaminophen, ALPRAZolam, bisacodyl, hydrALAZINE, morphine injection, morphine CONCENTRATE, ondansetron (ZOFRAN) IV, traMADol Allergies  Allergen Reactions  . Celebrex [Celecoxib] Rash  . Cymbalta [Duloxetine Hcl] Rash    Lip numbness,rash, leg and body jerks. Patient states " I though I was having a heart attack or stroke."   . Doxycycline Rash  . Lyrica [Pregabalin] Rash  . Avandia [Rosiglitazone] Rash  . Macrodantin [Nitrofurantoin Macrocrystal] Rash  . Silicone Rash    Gets rash from the touch it.  . Sulfa Antibiotics Rash  . Vioxx [Rofecoxib] Rash   Review of Systems complains of right sided heart failure, anxiety, dry eyes, decreased appetite, mild weight loss, loneliness.  Physical Exam  Well developed pleasant 85 yo female, awake alert with clear speech.  Vital Signs: BP (!) 130/53   Pulse 62   Temp 98.3 F (36.8 C) (Oral)   Resp 15   Ht _0  (1.6 m)   Wt 63.7 kg   SpO2 97%   BMI 24.88 kg/m  Pain Scale: 0-10   Pain Score: 2    SpO2: SpO2: 97 % O2 Device:SpO2: 97 % O2 Flow Rate: .O2 Flow Rate (L/min): 1.5 L/min  IO: Intake/output summary:   Intake/Output Summary (Last 24 hours) at 10/04/2018 1432 Last data filed at 10/04/2018 1200 Gross per 24 hour  Intake 1620.81 ml  Output 500 ml  Net 1120.81 ml    LBM: Last BM Date: 10/03/18 Baseline Weight: Weight: 61.7 kg Most recent weight: Weight: 63.7 kg     Palliative Assessment/Data: 40%     Time In: 1:00 Time Out: 3:00 Time Total: 2 hours. Greater than 50%  of this time was spent counseling and coordinating care related to the above assessment and plan.  Signed by: Florentina Jenny, PA-C Palliative Medicine Pager: [phone]  Please contact Palliative Medicine Team phone at [phone] for questions and concerns.  For  individual provider: See Shea Evans

## 2018-10-04 NOTE — Clinical Social Work Note (Signed)
Clinical Social Work Assessment  Patient Details  Name: Karen [name] MRN: [id number] Date of Birth: [birth year]  Date of referral:  10/04/18               Reason for consult:  Facility Placement, Discharge Planning                Permission sought to share information with:  Facility Sport and exercise psychologist, Family Supports Permission granted to share information::  Yes, Verbal Permission Granted  Name::     [name]  Agency::  SNF  Relationship::  son  Contact Information:  [phone]  Housing/Transportation Living arrangements for the past 2 months:  Single Family Home Source of Information:  Patient Patient Interpreter Needed:  None Criminal Activity/Legal Involvement Pertinent to Current Situation/Hospitalization:  No - Comment as needed Significant Relationships:  Adult Children Lives with:  Adult Children Do you feel safe going back to the place where you live?  Yes Need for family participation in patient care:  Yes (Comment)  Care giving concerns: Patient from home with son and daughter-in-law. PT recommending SNF.   Social Worker assessment / plan: CSW met with patient and daughter-in-law at bedside. Patient alert and oriented. CSW introduced self and role and discussed disposition planning - PT recommendation for SNF.   Daughter-in-law and patient were in agreement for rehab at [facility]. DIL indicated they will bring patient home with home health after SNF. Patient was at [facility] a few months ago and would like to return there. CSW sent initial referral, awaiting bed offer.   Patient will need [facility] authorization prior to admitting to the facility. Facility will initiate auth. CSW to follow.  Employment status:  Retired Research officer, political party) PT Recommendations:  [place] / Referral to community resources:  [place]  Patient/Family's Response to care: Patient and family appreciative of  care.  Patient/Family's Understanding of and Emotional Response to Diagnosis, Current Treatment, and Prognosis: Patient and family with good understanding of patient's condition and care needs. They are agreeable to SNF.  Emotional Assessment Appearance:  Appears stated age Attitude/Demeanor/Rapport:  Engaged Affect (typically observed):  Accepting, Calm, Appropriate Orientation:  Oriented to Self, Oriented to Place, Oriented to  Time, Oriented to Situation Alcohol / Substance use:  Not Applicable Psych involvement (Current and /or in the community):  No (Comment)  Discharge Needs  Concerns to be addressed:  Discharge Planning Concerns, Care Coordination Readmission within the last 30 days:  No Current discharge risk:  Physical Impairment Barriers to Discharge:  Continued Medical Work up, [place], LCSW 10/04/2018, 10:53 AM

## 2018-10-04 NOTE — Progress Notes (Addendum)
Progress Note  Patient Name: Karen [name] Date of Encounter: 10/04/2018  Primary Cardiologist: Mertie Moores, MD   Subjective   Chest pain has returned this morning. No improvement with morphine. SBPs in the 180s. RN just increased dose of IV nitro prior to my exam. Her son and daughter in law are present at bedside. She remains DNR. She has also felt anxious. Family requesting if her home alprazolam can be resumed as well as her home Prilosec.   Inpatient Medications    Scheduled Meds: . ALPRAZolam  1 mg Oral QHS  . amLODipine  2.5 mg Oral Daily  . aspirin EC  81 mg Oral Daily  . atorvastatin  10 mg Oral q1800  . gabapentin  200 mg Oral QHS  . insulin aspart  0-15 Units Subcutaneous TID WC  . insulin aspart  0-5 Units Subcutaneous QHS  . insulin aspart  2 Units Subcutaneous TID WC  . insulin glargine  36 Units Subcutaneous QHS  . levothyroxine  125 mcg Oral Q0600  . metoprolol tartrate  25 mg Oral BID  . pantoprazole  40 mg Oral Daily  . polyethylene glycol  17 g Oral Daily   Continuous Infusions: . heparin 800 Units/hr (10/03/18 2140)  . nitroGLYCERIN 35 mcg/min (10/04/18 0806)   PRN Meds: acetaminophen, ALPRAZolam, hydrALAZINE, morphine injection, ondansetron (ZOFRAN) IV, traMADol   Vital Signs    Vitals:   10/03/18 1630 10/03/18 2110 10/04/18 0455 10/04/18 0802  BP: (!) 139/53 (!) 160/62 (!) 147/58 (!) 130/53  Pulse: (!) 58 70 61 62  Resp: 15     Temp: 98.5 F (36.9 C) 97.8 F (36.6 C) 98.3 F (36.8 C)   TempSrc: Oral Oral Oral   SpO2: 99% 100% 100% 97%  Weight:   63.7 kg   Height:        Intake/Output Summary (Last 24 hours) at 10/04/2018 1028 Last data filed at 10/04/2018 0806 Gross per 24 hour  Intake 1620.81 ml  Output 700 ml  Net 920.81 ml   Last 3 Weights 10/04/2018 10/03/2018 10/02/2018  Weight (lbs) 140 lb 6.9 oz 133 lb 9.6 oz 133 lb  Weight (kg) 63.7 kg 60.601 kg 60.328 kg      Telemetry    Sinus brady at 55   ECG    EKG 10/01/2018  showed acute inferior infarct with inferior ST elevations- Personally Reviewed  Physical Exam   Physical Exam: Blood pressure (!) 130/53, pulse 62, temperature 98.3 F (36.8 C), temperature source Oral, resp. rate 15, height 5\' 3"  (1.6 m), weight 63.7 kg, SpO2 97 %.  GEN:   Elderly female,  Appears to be feeling better.  HEENT: Normal NECK: No JVD; No carotid bruits LYMPHATICS: No lymphadenopathy CARDIAC: RRR , 2/6 systlic murmur  RESPIRATORY:  Clear to auscultation without rales, wheezing or rhonchi  ABDOMEN: Soft, non-tender, non-distended MUSCULOSKELETAL:  No edema; No deformity  SKIN: Warm and dry NEUROLOGIC:  Alert and oriented x 3   Labs    Chemistry Recent Labs  Lab 09/30/18 1928  10/02/18 0035 10/03/18 0327 10/04/18 0446  NA 129*   < > 140 137 134*  K 5.4*   < > 4.8 5.0 5.2*  CL 93*   < > 104 101 100  CO2 23   < > 23 28 27   GLUCOSE 328*   < > 179* 187* 211*  BUN 51*   < > 30* 22 25*  CREATININE 2.16*   < > 1.49* 1.25* 1.45*  CALCIUM  8.2*   < > 8.3* 8.8* 8.7*  PROT 7.3  --   --   --   --   ALBUMIN 4.3  --   --   --   --   AST 26  --   --   --   --   ALT 24  --   --   --   --   ALKPHOS 131*  --   --   --   --   BILITOT 0.3  --   --   --   --   GFRNONAA 19*   < > 30* 37* 31*  GFRAA 22*   < > 35* 43* 36*  ANIONGAP 13   < > 13 8 7    < > = values in this interval not displayed.     Hematology Recent Labs  Lab 10/02/18 0035 10/03/18 0327 10/04/18 0446  WBC 12.9* 12.6* 12.9*  RBC 3.65* 3.43* 3.30*  HGB 11.1* 10.4* 10.2*  HCT 35.9* 34.1* 32.4*  MCV 98.4 99.4 98.2  MCH 30.4 30.3 30.9  MCHC 30.9 30.5 31.5  RDW 12.5 12.3 12.2  PLT 222 205 182    Cardiac Enzymes Recent Labs  Lab 10/01/18 1255 10/01/18 1902 10/02/18 0035 10/02/18 0720  TROPONINI 0.96* 3.00* 2.85* 2.70*   No results for input(s): TROPIPOC in the last 168 hours.   BNPNo results for input(s): BNP, PROBNP in the last 168 hours.   DDimer No results for input(s): DDIMER in the last  168 hours.   Radiology    No results found.  Cardiac Studies   2D echocardiogram 10/02/2018 1. The left ventricle has normal systolic function with an ejection fraction of 60-65%. The cavity size was normal. Left ventricular diastolic Doppler parameters are consistent with impaired relaxation Elevated left ventricular end-diastolic pressure.  2. The right ventricle has normal systolic function. The cavity was normal. There is no increase in right ventricular wall thickness.  3. The mitral valve is normal in structure.  4. The tricuspid valve is normal in structure.  5. The aortic valve is tricuspid.  6. The pulmonic valve was normal in structure.  Patient Profile     85 y.o. female who was admitted with several days of chest discomfort.  Initially her troponin levels were only minimally elevated.  on 3/2 she developed intensification of her pain associated with ST segment elevation in the inferior leads.  Assessment & Plan    1.  Inferior wall ST segment elevation myocardial infarction:   She is feeling quite a bit better.  She is not having any significant episodes of angina. Pressure is under control.  Her heart rate has slowed quite a bit. We will discontinue the IV heparin and discontinue the IV nitro.  I will start her on isosorbide mononitrate 30  mg a day.  2.  Acute [name] injury:    Creatinine is 1.45   3. HTN:    BP is well controled.   4. DM:    5.  Acute diastolic CHF:   Pt has acute diastolic CHF Continue supportive care Avoid excess salt.    She seems to be stable   For questions or updates, please contact [place] Please consult [email or web address] for contact info under        Mertie Moores, MD  10/04/2018, 10:28 AM

## 2018-10-04 NOTE — Progress Notes (Signed)
PT Cancellation Note  Patient Details Name: Karen [name] MRN: [id number] DOB: 02-Mar-1924   Cancelled Treatment:    Reason Eval/Treat Not Completed: Medical issues which prohibited therapy(Pt waiting on pain meds and eating lunch. )Will return as able.  Thanks.    Denice Paradise 10/04/2018, 12:22 PM Neythan Kozlov,PT Acute Rehabilitation Services Pager:  [phone]  Office:  [phone]

## 2018-10-04 NOTE — NC FL2 (Signed)
[place] LEVEL OF CARE SCREENING TOOL     IDENTIFICATION  Patient Name: Karen [name] Birthdate: 07-26-1924 Sex: female Admission Date (Current Location): 09/30/2018  [facility] and [place] Number:  Herbalist and Address:  [facility], [place] [address], [place], [place] [address]      Provider Number: [id number]  Attending Physician Name and Address:  Cristal Ford, DO  Relative Name and Phone Number:       Current Level of Care: Hospital Recommended Level of Care: [place] Prior Approval Number:    Date Approved/Denied:   PASRR Number: [id number]  Discharge Plan: SNF    Current Diagnoses: Patient Active Problem List   Diagnosis Date Noted  . Chest pain 10/01/2018  . Hypertensive urgency 10/01/2018  . Abdominal tenderness 10/01/2018  . Dysuria 10/01/2018  . Hyperglycemia 10/01/2018  . ST elevation myocardial infarction involving right coronary artery ([place])   . Hyperkalemia 08/03/2018  . Chronic pain syndrome 04/14/2018  . Neuropathy due to secondary diabetes ([place]) 03/22/2018  . History of TIA (transient ischemic attack) 03/22/2018  . Paroxysmal A-fib ([employer]) 04/03/2017  . Bilateral primary osteoarthritis of knee 04/03/2017  . Venous insufficiency of both lower extremities 03/06/2017  . Poorly controlled type 2 diabetes mellitus with autonomic neuropathy ([place]) 03/06/2017  . Hyperlipidemia   . Tachycardia 12/24/2014  . Obstructive sleep apnea 05/20/2014  . Chronic kidney disease, stage III (moderate) ([place]) 05/15/2013  . Osteoarthritis of both knees 05/15/2013  . Tremor 05/15/2013  . Varicose veins of lower extremities with other complications 56/31/4970  . Essential hypertension 12/17/2012  . Pruritic disorder 12/17/2012  . Spinal stenosis of lumbar region 12/17/2012  . Acid reflux   . GAD (generalized anxiety disorder)   . Hypothyroidism   . Dysphagia 10/09/2012    Orientation RESPIRATION  BLADDER Height & Weight     Self, Time, Situation, Place  O2(nasal cannula 1.5 L) Incontinent Weight: 63.7 kg Height:  5\' 3"  (160 cm)  BEHAVIORAL SYMPTOMS/MOOD NEUROLOGICAL BOWEL NUTRITION STATUS      Continent Diet(please see DC summary)  AMBULATORY STATUS COMMUNICATION OF NEEDS Skin   Limited Assist Verbally Normal                       Personal Care Assistance Level of Assistance  Bathing, Feeding, Dressing Bathing Assistance: Limited assistance Feeding assistance: Independent Dressing Assistance: Limited assistance     Functional Limitations Info  Sight, Hearing, Speech Sight Info: Adequate Hearing Info: Adequate Speech Info: Adequate    SPECIAL CARE FACTORS FREQUENCY  PT (By licensed PT), OT (By licensed OT)     PT Frequency: 5x/week OT Frequency: 5x/week            Contractures Contractures Info: Not present    Additional Factors Info  Code Status, Allergies Code Status Info: DNR Allergies Info: Celebrex (Celecoxib), Cymbalta (Duloxetine Hcl), Doxycycline, Lyrica (Pregabalin), Avandia (Rosiglitazone), Macrodantin (Nitrofurantoin Macrocrystal), Silicone, Sulfa Antibiotics, Vioxx (Rofecoxib)           Current Medications (10/04/2018):  This is the current hospital active medication list Current Facility-Administered Medications  Medication Dose Route Frequency Provider Last Rate Last Dose  . acetaminophen (TYLENOL) tablet 650 mg  650 mg Oral Q4H PRN Shela Leff, MD   650 mg at 10/01/18 2301  . ALPRAZolam Duanne Moron) tablet 0.5 mg  0.5 mg Oral TID PRN Lyda Jester M, PA-C   0.5 mg at 10/03/18 1752  . ALPRAZolam Duanne Moron) tablet  1 mg  1 mg Oral QHS Elodia Florence., MD   1 mg at 10/03/18 2230  . amLODipine (NORVASC) tablet 2.5 mg  2.5 mg Oral Daily Cristal Ford, DO   2.5 mg at 10/04/18 0803  . aspirin EC tablet 81 mg  81 mg Oral Daily Robinette Haines, MD   81 mg at 10/04/18 0240  . atorvastatin (LIPITOR) tablet 10 mg  10 mg Oral q1800  Robinette Haines, MD   10 mg at 10/03/18 1747  . gabapentin (NEURONTIN) capsule 200 mg  200 mg Oral QHS Elodia Florence., MD   200 mg at 10/03/18 2252  . heparin ADULT infusion 100 units/mL (25000 units/254mL sodium chloride 0.45%)  800 Units/hr Intravenous Continuous Lyndee Leo, RPH 8 mL/hr at 10/03/18 2140 800 Units/hr at 10/03/18 2140  . hydrALAZINE (APRESOLINE) injection 5 mg  5 mg Intravenous Q4H PRN Elodia Florence., MD      . insulin aspart (novoLOG) injection 0-15 Units  0-15 Units Subcutaneous TID [facility] Cristal Ford, DO   3 Units at 10/03/18 1747  . insulin aspart (novoLOG) injection 0-5 Units  0-5 Units Subcutaneous QHS Elodia Florence., MD   3 Units at 10/03/18 2229  . insulin aspart (novoLOG) injection 2 Units  2 Units Subcutaneous TID WC Mikhail, Maryann, DO      . insulin glargine (LANTUS) injection 36 Units  36 Units Subcutaneous QHS Shela Leff, MD   36 Units at 10/03/18 2253  . levothyroxine (SYNTHROID, LEVOTHROID) tablet 125 mcg  125 mcg Oral Q0600 Gardiner Barefoot, NP   125 mcg at 10/04/18 9735  . metoprolol tartrate (LOPRESSOR) tablet 25 mg  25 mg Oral BID Cristal Ford, DO   25 mg at 10/04/18 3299  . morphine 2 MG/ML injection 2 mg  2 mg Intravenous Q2H PRN Elodia Florence., MD   2 mg at 10/03/18 2345  . nitroGLYCERIN 50 mg in dextrose 5 % 250 mL (0.2 mg/mL) infusion  0-200 mcg/min Intravenous Titrated Elodia Florence., MD 10.5 mL/hr at 10/04/18 0806 35 mcg/min at 10/04/18 0806  . ondansetron (ZOFRAN) injection 4 mg  4 mg Intravenous Q6H PRN Shela Leff, MD      . pantoprazole (PROTONIX) EC tablet 40 mg  40 mg Oral Daily Lyda Jester M, PA-C   40 mg at 10/04/18 2426  . polyethylene glycol (MIRALAX / GLYCOLAX) packet 17 g  17 g Oral Daily Elodia Florence., MD   17 g at 10/04/18 0804  . traMADol (ULTRAM) tablet 50 mg  50 mg Oral Q12H PRN Shela Leff, MD   50 mg at 10/01/18 1139     Discharge  Medications: Please see discharge summary for a list of discharge medications.  Relevant Imaging Results:  Relevant Lab Results:   Additional Information SSN: [id number]   Estanislado Emms, LCSW

## 2018-10-04 NOTE — Care Management Important Message (Signed)
Important Message  Patient Details  Name: Karen [name] MRN: [id number] Date of Birth: 1923-12-29   Medicare Important Message Given:  Yes    Daren Yeagle P [place] 10/04/2018, 1:28 PM

## 2018-10-04 NOTE — Progress Notes (Signed)
PROGRESS NOTE    Karen [name]  ZOX:[id number] DOB: [birth year] DOA: 09/30/2018 PCP: No primary care provider on file.   Brief Narrative:  HPI on 10/01/2018 by Dr. Shela Leff Karen [name] is a 85 y.o. female with medical history significant of hypertension, hyperlipidemia, CKD stage IV, OSA, GERD, type 2 diabetes, paroxysmal atrial fibrillation, prior TIA/CVA, anxiety, history of esophageal stricture status post dilation and hiatal hernia on PPI presenting to the hospital for evaluation of chest pain. Patient states her chest pain started 2/29 at 5 AM and woke her up from her sleep.  She describes it as sharp right-sided chest pain which radiates to her right shoulder and right arm.  Associated with shortness of breath.  No nausea or diaphoresis.  2 hours later she spoke to her son over the phone who advised her to take sublingual nitroglycerin.  Her chest pain improved a little with sublingual nitroglycerin but persisted the entire day.  She took additional nitroglycerin around dinnertime which helped a little but again the chest pain persisted.  The following day she continued to have chest pain and took multiple doses of sublingual nitroglycerin with no improvement.  Patient received another dose of sublingual nitroglycerin in the ED but continues to have chest pain.  She is a never smoker.  Patient thinks her chest pain might be indigestion. Family at bedside states that patient has a history of esophageal stricture and several dilations done in the past.  Unclear why patient has sublingual nitroglycerin at home as family states she does not have any history of coronary artery disease.   Interim history Patient admitted with STEMI, cardiology consulted and appreciated.  Given patient's age, will treat with medical therapy.  Assessment & Plan   STEMI -Patient presented with chest pain and was noted to have EKG changes with ST elevation in leads II and aVF, depression in I and  aVL -Troponin peaked at 3, and is now downtrending -Cardiology consulted and appreciated, patient was placed on aspirin and heparin gtt -Given patient's age, it was decided against catheterization and to treat with medical management -Echocardiogram EF of 60 to 65%.  LV diastolic parameters are consistent with impaired relaxation elevated left ventricular end-diastolic pressure -Continue metoprolol (patient with mild bradycardia today, reduced dose of metoprolol) -Cardiology started isosorbide mononitrate 30mg  daily   Hypertensive urgency -Upon arrival to the emergency department patient had elevated blood pressure of 205/69, has now improved but continues to be borderline high -Continue hydralazine -Reduced metoprolol due to bradycardia -Discussed with Dr. Acie Fredrickson, amlodipine added -BP better controlled  Abdominal tenderness -Unremarkable on exam today -CT abdomen pelvis without acute findings -UA unremarkable, LFTs and lipase within normal limits on presentation  Dysuria -Patient reported dysuria however was afebrile with borderline WBC count -UA unremarkable for infection  ?MAI infection -CT chest showed nodularity in the right lower lobe and right middle lobe primarily with few nodules on the left, suspected infectious process such as MAI however findings not definitive/nodule right middle lobe measuring 8 mm -Patient denies fevers, night sweats, weight loss -WBC 12.6 -Currently afebrile nontoxic-appearing -Was placed on ceftriaxone and azithromycin in the ED however this is currently held -Patient will need repeat noncontrast CT in 3 months to ensure resolution -Blood cultures show no growth to date  Hyperkalemia -Resolved, continue to monitor BMP  Diabetes mellitus, type II with hyperglycemia -Continue Lantus 36 units daily -Will change over to moderate insulin sliding scale with CBG monitoring  Chronic kidney disease, stage IV -Creatinine  baseline approximate  1.8 -Creatinine currently 1.45 -Continue to monitor BMP  Hypothyroidism -Continue Synthroid  Constipation -continue miralax, suppository added  Goals of care -Currently DNR.  Family requesting palliative care consult for goals of care and planning.  DVT Prophylaxis  heparin  Code Status: DNR  Family Communication: Family at bedside  Disposition Plan: Admitted. Pending SNF  Consultants Cardiology Palliative care  Procedures  Echocardiogram  Antibiotics   Anti-infectives (From admission, onward)   Start     Dose/Rate Route Frequency Ordered Stop   09/30/18 2345  cefTRIAXone (ROCEPHIN) 1 g in sodium chloride 0.9 % 100 mL IVPB     1 g 200 mL/hr over 30 Minutes Intravenous  Once 09/30/18 2337 10/01/18 0059   09/30/18 2345  azithromycin (ZITHROMAX) 500 mg in sodium chloride 0.9 % 250 mL IVPB     500 mg 250 mL/hr over 60 Minutes Intravenous  Once 09/30/18 2337 10/01/18 0159      Subjective:   Karen [name] seen and examined today.  Patient with no complaints today.  Denies current chest pain, shortness breath, abdominal pain, nausea or vomiting, diarrhea, dizziness or headache.  Objective:   Vitals:   10/03/18 1630 10/03/18 2110 10/04/18 0455 10/04/18 0802  BP: (!) 139/53 (!) 160/62 (!) 147/58 (!) 130/53  Pulse: (!) 58 70 61 62  Resp: 15     Temp: 98.5 F (36.9 C) 97.8 F (36.6 C) 98.3 F (36.8 C)   TempSrc: Oral Oral Oral   SpO2: 99% 100% 100% 97%  Weight:   63.7 kg   Height:        Intake/Output Summary (Last 24 hours) at 10/04/2018 1357 Last data filed at 10/04/2018 1200 Gross per 24 hour  Intake 1620.81 ml  Output 500 ml  Net 1120.81 ml   Filed Weights   10/02/18 0440 10/03/18 0521 10/04/18 0455  Weight: 60.3 kg 60.6 kg 63.7 kg   Exam  General: Well developed, elderly, NAD  HEENT: NCAT, mucous membranes moist.   Neck: Supple  Cardiovascular: S1 S2 auscultated, soft SEM, RRR  Respiratory: Diminished breath sounds, however  clear  Abdomen: Soft, nontender, nondistended, + bowel sounds  Extremities: warm dry without cyanosis clubbing or edema  Neuro: AAOx3, nonfocal  Psych: Appropriate mood and affect, pleasant   Data Reviewed: I have personally reviewed following labs and imaging studies  CBC: Recent Labs  Lab 09/30/18 1928 10/01/18 0643 10/02/18 0035 10/03/18 0327 10/04/18 0446  WBC 11.5* 11.4* 12.9* 12.6* 12.9*  NEUTROABS 8.3*  --   --   --   --   HGB 12.9 11.6* 11.1* 10.4* 10.2*  HCT 41.8 36.9 35.9* 34.1* 32.4*  MCV 99.1 96.9 98.4 99.4 98.2  PLT 257 215 222 205 408   Basic Metabolic Panel: Recent Labs  Lab 09/30/18 1928 10/01/18 0643 10/02/18 0035 10/03/18 0327 10/04/18 0446  NA 129* 139 140 137 134*  K 5.4* 4.8 4.8 5.0 5.2*  CL 93* 105 104 101 100  CO2 23 23 23 28 27   GLUCOSE 328* 163* 179* 187* 211*  BUN 51* 43* 30* 22 25*  CREATININE 2.16* 1.80* 1.49* 1.25* 1.45*  CALCIUM 8.2* 8.1* 8.3* 8.8* 8.7*   GFR: Estimated Creatinine Clearance: 22.2 mL/min (A) (by C-G formula based on SCr of 1.45 mg/dL (H)). Liver Function Tests: Recent Labs  Lab 09/30/18 1928  AST 26  ALT 24  ALKPHOS 131*  BILITOT 0.3  PROT 7.3  ALBUMIN 4.3   Recent Labs  Lab 09/30/18 1928  LIPASE 20  No results for input(s): AMMONIA in the last 168 hours. Coagulation Profile: Recent Labs  Lab 09/30/18 1928  INR 1.0   Cardiac Enzymes: Recent Labs  Lab 10/01/18 0643 10/01/18 1255 10/01/18 1902 10/02/18 0035 10/02/18 0720  TROPONINI 0.18* 0.96* 3.00* 2.85* 2.70*   BNP (last 3 results) No results for input(s): PROBNP in the last 8760 hours. HbA1C: No results for input(s): HGBA1C in the last 72 hours. CBG: Recent Labs  Lab 10/03/18 1143 10/03/18 1632 10/03/18 2112 10/04/18 0724 10/04/18 1138  GLUCAP 206* 175* 274* 172* 228*   Lipid Profile: No results for input(s): CHOL, HDL, LDLCALC, TRIG, CHOLHDL, LDLDIRECT in the last 72 hours. Thyroid Function Tests: No results for input(s):  TSH, T4TOTAL, FREET4, T3FREE, THYROIDAB in the last 72 hours. Anemia Panel: No results for input(s): VITAMINB12, FOLATE, FERRITIN, TIBC, IRON, RETICCTPCT in the last 72 hours. Urine analysis:    Component Value Date/Time   COLORURINE STRAW (A) 10/01/2018 2340   APPEARANCEUR CLEAR 10/01/2018 2340   LABSPEC 1.012 10/01/2018 2340   PHURINE 5.0 10/01/2018 2340   GLUCOSEU NEGATIVE 10/01/2018 2340   HGBUR NEGATIVE 10/01/2018 2340   BILIRUBINUR NEGATIVE 10/01/2018 2340   BILIRUBINUR Neg 05/30/2017 1329   KETONESUR NEGATIVE 10/01/2018 2340   PROTEINUR NEGATIVE 10/01/2018 2340   UROBILINOGEN 0.2 05/30/2017 1329   UROBILINOGEN 0.2 12/27/2010 0505   NITRITE NEGATIVE 10/01/2018 2340   LEUKOCYTESUR NEGATIVE 10/01/2018 2340   Sepsis Labs: @LABRCNTIP (procalcitonin:4,lacticidven:4)  ) Recent Results (from the past 240 hour(s))  Blood culture (routine x 2)     Status: None (Preliminary result)   Collection Time: 09/30/18 11:45 PM  Result Value Ref Range Status   Specimen Description BLOOD RIGHT ARM  Final   Special Requests   Final    BOTTLES DRAWN AEROBIC AND ANAEROBIC Blood Culture adequate volume   Culture   Final    NO GROWTH 3 DAYS Performed at [facility], [place] [address], [place], [place] [address]    Report Status PENDING  Incomplete  Blood culture (routine x 2)     Status: None (Preliminary result)   Collection Time: 09/30/18 11:55 PM  Result Value Ref Range Status   Specimen Description BLOOD LEFT WRIST  Final   Special Requests   Final    BOTTLES DRAWN AEROBIC AND ANAEROBIC Blood Culture adequate volume   Culture   Final    NO GROWTH 3 DAYS Performed at [facility], [place] [address], [place], [place] [address]    Report Status PENDING  Incomplete      Radiology Studies: No results found.   Scheduled Meds: . ALPRAZolam  1 mg Oral QHS  . amLODipine  2.5 mg Oral Daily  . aspirin EC  81 mg Oral Daily  . atorvastatin  10 mg Oral q1800  . gabapentin  200 mg  Oral QHS  . insulin aspart  0-15 Units Subcutaneous TID WC  . insulin aspart  0-5 Units Subcutaneous QHS  . insulin aspart  2 Units Subcutaneous TID WC  . insulin glargine  36 Units Subcutaneous QHS  . isosorbide mononitrate  30 mg Oral Daily  . levothyroxine  125 mcg Oral Q0600  . metoprolol tartrate  12.5 mg Oral BID  . pantoprazole  40 mg Oral Daily  . polyethylene glycol  17 g Oral Daily  . saccharomyces boulardii  250 mg Oral BID   Continuous Infusions:    LOS: 3 days   Time Spent in minutes   30 minutes  Alok Minshall D.O. on 10/04/2018 at 1:57 PM  Between 7am to 7pm - Please see pager noted on [email or web address]  After 7pm go to [email or web address]  And look for the night coverage person covering for me after hours  [facility] Office  [phone]

## 2018-10-04 NOTE — Social Work (Signed)
[facility] accepted referral and started [facility] authorization. Will need auth before patient can admit to the facility.  CSW updated patient and son at bedside.  Estanislado Emms, LCSW [phone]

## 2018-10-05 ENCOUNTER — Other Ambulatory Visit (HOSPITAL_COMMUNITY): Payer: Self-pay

## 2018-10-05 DIAGNOSIS — I214 Non-ST elevation (NSTEMI) myocardial infarction: Secondary | ICD-10-CM

## 2018-10-05 DIAGNOSIS — I259 Chronic ischemic heart disease, unspecified: Secondary | ICD-10-CM

## 2018-10-05 LAB — POTASSIUM: POTASSIUM: 5.4 mmol/L — AB (ref 3.5–5.1)

## 2018-10-05 LAB — CBC
HCT: 34.1 % — ABNORMAL LOW (ref 36.0–46.0)
Hemoglobin: 10.9 g/dL — ABNORMAL LOW (ref 12.0–15.0)
MCH: 31.1 pg (ref 26.0–34.0)
MCHC: 32 g/dL (ref 30.0–36.0)
MCV: 97.2 fL (ref 80.0–100.0)
Platelets: 180 10*3/uL (ref 150–400)
RBC: 3.51 MIL/uL — ABNORMAL LOW (ref 3.87–5.11)
RDW: 12.2 % (ref 11.5–15.5)
WBC: 13.9 10*3/uL — ABNORMAL HIGH (ref 4.0–10.5)
nRBC: 0 % (ref 0.0–0.2)

## 2018-10-05 LAB — BASIC METABOLIC PANEL
ANION GAP: 8 (ref 5–15)
BUN: 25 mg/dL — ABNORMAL HIGH (ref 8–23)
CO2: 27 mmol/L (ref 22–32)
Calcium: 8.8 mg/dL — ABNORMAL LOW (ref 8.9–10.3)
Chloride: 99 mmol/L (ref 98–111)
Creatinine, Ser: 1.42 mg/dL — ABNORMAL HIGH (ref 0.44–1.00)
GFR calc Af Amer: 37 mL/min — ABNORMAL LOW (ref >60)
GFR calc non Af Amer: 32 mL/min — ABNORMAL LOW (ref >60)
Glucose, Bld: 201 mg/dL — ABNORMAL HIGH (ref 70–99)
Potassium: 5.4 mmol/L — ABNORMAL HIGH (ref 3.5–5.1)
Sodium: 134 mmol/L — ABNORMAL LOW (ref 135–145)

## 2018-10-05 LAB — GLUCOSE, CAPILLARY
GLUCOSE-CAPILLARY: 216 mg/dL — AB (ref 70–99)
Glucose-Capillary: 195 mg/dL — ABNORMAL HIGH (ref 70–99)
Glucose-Capillary: 148 mg/dL — ABNORMAL HIGH (ref 70–99)
Glucose-Capillary: 253 mg/dL — ABNORMAL HIGH (ref 70–99)
Glucose-Capillary: 291 mg/dL — ABNORMAL HIGH (ref 70–99)

## 2018-10-05 MED ORDER — ISOSORBIDE MONONITRATE ER 30 MG PO TB24: 30.0000 mg | ORAL_TABLET | Freq: Every day | ORAL | Status: DC

## 2018-10-05 MED ORDER — MORPHINE SULFATE (CONCENTRATE) 10 MG/0.5ML PO SOLN: 10.0000 mg | ORAL | 0 refills | Status: AC | PRN

## 2018-10-05 MED ORDER — ASPIRIN 81 MG PO TBEC: 81.0000 mg | DELAYED_RELEASE_TABLET | Freq: Every day | ORAL | Status: AC

## 2018-10-05 MED ORDER — LACTULOSE 10 GM/15ML PO SOLN: 20.0000 g | Freq: Once | ORAL | Status: DC

## 2018-10-05 MED ORDER — HYDROCODONE-ACETAMINOPHEN 7.5-325 MG PO TABS: 1.0000 | ORAL_TABLET | Freq: Four times a day (QID) | ORAL | 0 refills | Status: DC | PRN

## 2018-10-05 MED ORDER — AMLODIPINE BESYLATE 2.5 MG PO TABS: 2.5000 mg | ORAL_TABLET | Freq: Every day | ORAL | Status: DC

## 2018-10-05 MED ORDER — ALPRAZOLAM 0.5 MG PO TABS: 0.5000 mg | ORAL_TABLET | Freq: Three times a day (TID) | ORAL | 0 refills | Status: DC | PRN

## 2018-10-05 MED ORDER — ATORVASTATIN CALCIUM 10 MG PO TABS: 10.0000 mg | ORAL_TABLET | Freq: Every day | ORAL | Status: DC

## 2018-10-05 MED ORDER — METOPROLOL TARTRATE 25 MG PO TABS: 12.5000 mg | ORAL_TABLET | Freq: Two times a day (BID) | ORAL | Status: DC

## 2018-10-05 NOTE — Discharge Summary (Signed)
Physician Discharge Summary  Karen [name] LGX:[id number] DOB: 1923-10-08 DOA: 09/30/2018  PCP: Karen Sanders, MD  Admit date: 09/30/2018 Discharge date: 10/08/2018  Time spent: 45 minutes  Recommendations for Outpatient Follow-up:  Patient will be discharged to skilled nursing facility, continue physical and occupational therapy as tolerated.  Patient will need to follow up with primary care provider within one week of discharge, repeat CBC and BMP.  Follow up with cardiology as needed. Patient should continue medications as prescribed.  Patient should follow a regular diet.   Discharge Diagnoses:  Principal Problem: STEMI Hypertensive urgency Abdominal tenderness Dysuria ?MAI infection Hyperkalemia Diabetes mellitus, type II with hyperglycemia Chronic kidney disease, stage IV Hypothyroidism Constipation Goals of care  Discharge Condition: Stable  Diet recommendation: regular  Filed Weights   10/06/18 0548 10/07/18 0436 10/08/18 0500  Weight: 61.4 kg 61.6 kg 60.5 kg    History of present illness:  on 10/01/2018 by Dr. Hedwig Morton [name] a 85 y.o.femalewith medical history significant ofhypertension, hyperlipidemia, CKD stageIV, OSA, GERD, type 2 diabetes, paroxysmal atrial fibrillation, prior TIA/CVA, anxiety,history of esophageal stricture status post dilation and hiatal hernia on PPI presenting to the hospital for evaluation of chest pain.Patient states her chest pain started 2/29 at 5 AM and woke her up from her sleep. She describes it as sharp right-sided chest pain which radiates to her right shoulder and right arm. Associated with shortness of breath. No nauseaor diaphoresis. 2 hours later she spoke to her son over the phone who advised her to take sublingual nitroglycerin. Her chest pain improved a little with sublingual nitroglycerin but persisted the entire day. She took additional nitroglycerin around dinnertime which helped a little  but again the chest pain persisted. The following day she continued to have chest pain and took multiple doses of sublingual nitroglycerin with no improvement. Patient received another dose of sublingual nitroglycerin in the ED but continues to have chest pain. She is a never smoker.Patient thinks her chest pain might be indigestion. Family at bedside states that patient has a history of esophageal stricture and several dilations done in the past. Unclear why patient has sublingual nitroglycerin at home as family states she does not have any history of coronary artery disease.   Hospital Course:  STEMI -Patient presented with chest pain and was noted to have EKG changes with ST elevation in leads II and aVF, depression in I and aVL -Troponin peaked at 3, and is now downtrending -Cardiology consulted and appreciated, patient was placed on aspirin and heparin gtt -Given patient's age, it was decided against catheterization and to treat with medical management -Echocardiogram EF of 60 to 65%.  LV diastolic parameters are consistent with impaired relaxation elevated left ventricular end-diastolic pressure -Continue metoprolol (patient with mild bradycardia today, reduced dose of metoprolol) -Continue isosorbide mononitrate 30mg  daily, ASA 81mg  daily, metoprolol 12.5mg  BID  Hypertensive urgency -Upon arrival to the emergency department patient had elevated blood pressure of 205/69, has now improved but continues to be borderline highe -Reduced metoprolol due to bradycardia -Continue metoprolol and amlodipine  -BP better controlled and stable  Abdominal tenderness -Unremarkable on exam today -CT abdomen pelvis without acute findings -UA unremarkable, LFTs and lipase within normal limits on presentation  Dysuria -Patient reported dysuria however was afebrile with borderline WBC count -UA unremarkable for infection  ?MAI infection -CT chest showed nodularity in the right lower lobe and  right middle lobe primarily with few nodules on the left, suspected infectious process such as  MAI however findings not definitive/nodule right middle lobe measuring 8 mm -Patient denies fevers, night sweats, weight loss -Currently afebrile nontoxic-appearing -Was placed on ceftriaxone and azithromycin in the ED however this is currently held -Patient will need repeat noncontrast CT in 3 months to ensure resolution -Blood cultures show no growth to date  Leukocytosis -possible reactive -repeat CBC in one week  Hyperkalemia -Resolved  Diabetes mellitus, type II with hyperglycemia -Continue home regimen on discharge  Chronic kidney disease, stage IV -Creatinine baseline approximate 1.8 -Creatinine currently 1.7  Hypothyroidism -Continue Synthroid  Constipation -Resolved  Goals of care -Currently DNR.  Family requesting palliative care consult for goals of care and planning. -Palliative care consulted and appreciated.   Consultants Cardiology Palliative care  Procedures  Echocardiogram  Discharge Exam: Vitals:   10/08/18 0017 10/08/18 0500  BP: (!) 113/48 (!) 122/51  Pulse: 69 70  Resp: 20 (!) 22  Temp: 98.2 F (36.8 C) 98.5 F (36.9 C)  SpO2: 97% 100%     General: Well developed, chronically ill appearing, frail, NAD  HEENT: NCAT, mucous membranes moist.  Cardiovascular: S1 S2 auscultated, soft SEM, RRR  Respiratory: Clear to auscultation bilaterally with equal chest rise  Abdomen: Soft, nontender, nondistended, + bowel sounds  Extremities: warm dry without cyanosis clubbing or edema  Neuro: AAOx3, nonfocal  Psych: Anxious, however appropriate  Discharge Instructions Discharge Instructions    Discharge instructions   Complete by:  As directed    Patient will be discharged to skilled nursing facility, continue physical and occupational therapy as tolerated.  Patient will need to follow up with primary care provider within one week of  discharge, repeat CBC and BMP.  Follow up with cardiology as needed. Patient should continue medications as prescribed.  Patient should follow a regular diet.     Allergies as of 10/08/2018      Reactions   Celebrex [celecoxib] Rash   Cymbalta [duloxetine Hcl] Rash   Lip numbness,rash, leg and body jerks. Patient states " I though I was having a heart attack or stroke."    Doxycycline Rash   Lyrica [pregabalin] Rash   Avandia [rosiglitazone] Rash   Macrodantin [nitrofurantoin Macrocrystal] Rash   Silicone Rash   Gets rash from the touch it.   Sulfa Antibiotics Rash   Vioxx [rofecoxib] Rash      Medication List    STOP taking these medications   digoxin 0.125 MG tablet Commonly known as:  LANOXIN     TAKE these medications   ALPRAZolam 0.5 MG tablet Commonly known as:  XANAX Take 1 tablet (0.5 mg total) by mouth 3 (three) times daily as needed for anxiety. What changed:    medication strength  how much to take  how to take this  when to take this  reasons to take this  additional instructions   amLODipine 2.5 MG tablet Commonly known as:  NORVASC Take 1 tablet (2.5 mg total) by mouth daily.   aspirin 81 MG EC tablet Take 1 tablet (81 mg total) by mouth daily. What changed:    medication strength  how much to take   atorvastatin 10 MG tablet Commonly known as:  LIPITOR Take 1 tablet (10 mg total) by mouth daily at 6 PM.   CRANBERRY PO Take 300 mg by mouth daily.   [employer] NATURAL LUTEIN EYE HEALTH PO Take 1 tablet by mouth daily.   diclofenac sodium 1 % Gel Commonly known as:  VOLTAREN Apply 4 g topically 4 (four) times  daily. To bilateral knees   furosemide 40 MG tablet Commonly known as:  LASIX TAKE 1 TABLET BY MOUTH DAILY TO CONTROL EDEMA What changed:    how much to take  how to take this  when to take this  additional instructions   gabapentin 100 MG capsule Commonly known as:  NEURONTIN TAKE TWO CAPSULES BY MOUTH AT BEDTIME TO HELP  WITH TREMORS What changed:  See the new instructions.   glucagon 1 MG injection Commonly known as:  Glucagon Emergency Inject 1 mg into the muscle once as needed for up to 1 dose.   HYDROcodone-acetaminophen 7.5-325 MG tablet Commonly known as:  NORCO Take 1 tablet by mouth every 6 (six) hours as needed for moderate pain. What changed:    when to take this  reasons to take this  Another medication with the same name was removed. Continue taking this medication, and follow the directions you see here.   insulin lispro 100 UNIT/ML KwikPen Commonly known as:  HUMALOG Inject under skin up to 50 units a day as advised. Dx:E11.29, Normal What changed:    how much to take  how to take this  when to take this  additional instructions   Insulin Pen Needle 31G X 8 MM Misc Commonly known as:  B-D ULTRAFINE III SHORT PEN Use with administering insulin. Dx. E11.29   isosorbide mononitrate 30 MG 24 hr tablet Commonly known as:  IMDUR Take 1 tablet (30 mg total) by mouth daily.   Lantus SoloStar 100 UNIT/ML Solostar Pen Generic drug:  Insulin Glargine INJECT 42 UNITS INTO THE SKIN AT BEDTIME What changed:  See the new instructions.   levothyroxine 125 MCG tablet Commonly known as:  SYNTHROID, LEVOTHROID TAKE ONE TABLET BY MOUTH EVERY MORNING 30 MINUTES BEFORE BREAKFAST What changed:  See the new instructions.   metoprolol tartrate 25 MG tablet Commonly known as:  LOPRESSOR Take 0.5 tablets (12.5 mg total) by mouth 2 (two) times daily. What changed:    medication strength  See the new instructions.   morphine CONCENTRATE 10 MG/0.5ML Soln concentrated solution Place 0.5 mLs (10 mg total) under the tongue every 4 (four) hours as needed for severe pain (Chest pain or anginal equivalent).   nitroGLYCERIN 0.4 MG SL tablet Commonly known as:  Nitrostat Place 1 tablet (0.4 mg total) under the tongue every 5 (five) minutes as needed for chest pain.   omeprazole 40 MG  capsule Commonly known as:  PRILOSEC TAKE ONE (1) CAPSULE BY MOUTH 2 TIMES DAILY FOR STOMACH What changed:  See the new instructions.   ondansetron 4 MG tablet Commonly known as:  ZOFRAN TAKE ONE TABLET BY MOUTH EVERY 4 HOURS AS NEEDED FOR NAUSEA/VOMITING What changed:  See the new instructions.   ONE TOUCH ULTRA TEST test strip Generic drug:  glucose blood Use to test sugar three times daily dx E11.29   PRESERVISION AREDS 2 PO Take 1 tablet by mouth 2 (two) times daily.   SYSTANE OP Place 1 drop into both eyes 2 (two) times daily.   triamcinolone cream 0.1 % Commonly known as:  KENALOG Apply 1 application topically as needed. Mix with CeraVe cream apply twice daily for dry skin   trimethoprim 100 MG tablet Commonly known as:  TRIMPEX TAKE ONE TABLET BY MOUTH EVERY NIGHT AT BEDTIME TO PREVENT BLADDER INFECTION. What changed:  See the new instructions.   Vitamin D (Ergocalciferol) 1.25 MG (50000 UT) Caps capsule Commonly known as:  DRISDOL TAKE ONE CAPSULE  BY MOUTH ON THE SAME DAY EVERY WEEK AS DIRECTED What changed:  See the new instructions.   VITAMIN E PO Take 400 Units by mouth daily.      Allergies  Allergen Reactions  . Celebrex [Celecoxib] Rash  . Cymbalta [Duloxetine Hcl] Rash    Lip numbness,rash, leg and body jerks. Patient states " I though I was having a heart attack or stroke."   . Doxycycline Rash  . Lyrica [Pregabalin] Rash  . Avandia [Rosiglitazone] Rash  . Macrodantin [Nitrofurantoin Macrocrystal] Rash  . Silicone Rash    Gets rash from the touch it.  . Sulfa Antibiotics Rash  . Vioxx [Rofecoxib] Rash    Contact information for follow-up providers    Nahser, Wonda Cheng, MD. Schedule an appointment as soon as possible for a visit.   Specialty:  Cardiology Why:  As needed Contact information: [place] [address] [phone]            Contact information for after-discharge care    Destination     [facility] Preferred [facility]F .   Service:  Skilled Nursing Contact information: [address] [place] [phone]                   The results of significant diagnostics from this hospitalization (including imaging, microbiology, ancillary and laboratory) are listed below for reference.    Significant Diagnostic Studies: Ct Abdomen Pelvis Wo Contrast  Result Date: 09/30/2018 CLINICAL DATA:  Chest pain since yesterday. Intermittent relief with nitroglycerin. Shortness of breath and diaphoresis. EXAM: CT CHEST, ABDOMEN AND PELVIS WITHOUT CONTRAST TECHNIQUE: Multidetector CT imaging of the chest, abdomen and pelvis was performed following the standard protocol without IV contrast. COMPARISON:  Chest x-ray September 30, 2018 FINDINGS: CT CHEST FINDINGS Cardiovascular: The thoracic aorta demonstrates atherosclerotic change without aneurysm. The fat around the thoracic aorta is normal. No evidence of leak. Dissection can not be assessed without contrast. The central pulmonary arteries are normal in caliber. Coronary artery calcifications involve the LAD and circumflex artery primarily. The heart is unremarkable. Mediastinum/Nodes: The patient has a pectus deformity. No adenopathy. No effusion. Lungs/Pleura: Central airways are normal. No pneumothorax. Scattered nodularity in the lungs, right greater than left, particularly in the right middle lobe and posteriorly in the right lower lobe. A representative nodule in the right middle lobe measures 7.5 mm. No myalgias is. No other infiltrates. Musculoskeletal: See below. CT ABDOMEN PELVIS FINDINGS Hepatobiliary: No focal liver abnormality is seen. Status post cholecystectomy. No biliary dilatation. Pancreas: Unremarkable. No pancreatic ductal dilatation or surrounding inflammatory changes. Spleen: Normal in size without focal abnormality. Adrenals/Urinary Tract: Adrenal glands are unremarkable. Kidneys are normal, without  renal calculi, focal lesion, or hydronephrosis. Bladder is unremarkable. Stomach/Bowel: A duodenal diverticulum is identified without evidence of inflammation. The stomach and small bowel are otherwise normal. Colonic diverticulosis is identified without convincing evidence of diverticulitis. The remainder of the colon is unremarkable. Status post appendectomy. Vascular/Lymphatic: Dense atherosclerotic change in the nonaneurysmal abdominal aorta. Dense atherosclerotic change at the origin of the left renal artery. No adenopathy. Reproductive: The patient may be status post hysterectomy. Recommend clinical correlation. Other: No abdominal wall hernia or abnormality. No abdominopelvic ascites. Musculoskeletal: Degenerative changes in the hips. Degenerative changes throughout the spine. Pedicle rods and screws seen at L3 and L4, in good position. No acute spinal fracture noted. IMPRESSION: 1. Nodularity in the right lower lobe and right middle lobe primarily with a few  nodules on the left. I suspect an infectious process such as MAI. The findings however are not definitive. A representative nodule in the right middle lobe measures 8 mm. Non-contrast chest CT at 3-6 months is recommended. If the nodules are stable at time of repeat CT, then future CT at 18-24 months (from today's scan) is considered optional for low-risk patients, but is recommended for high-risk patients. This recommendation follows the consensus statement: Guidelines for Management of Incidental Pulmonary Nodules Detected on CT Images: From the Fleischner Society 2017; Radiology 2017; 284:228-243. 2. Degenerative changes in the spine. No other cause for the patient's symptoms identified. 3. Atherosclerotic changes in the thoracic and abdominal aorta without aneurysm. 4. Coronary artery calcifications. Electronically Signed   By: Dorise Bullion III M.D   On: 09/30/2018 22:32   Ct Chest Wo Contrast  Result Date: 09/30/2018 CLINICAL DATA:  Chest pain  since yesterday. Intermittent relief with nitroglycerin. Shortness of breath and diaphoresis. EXAM: CT CHEST, ABDOMEN AND PELVIS WITHOUT CONTRAST TECHNIQUE: Multidetector CT imaging of the chest, abdomen and pelvis was performed following the standard protocol without IV contrast. COMPARISON:  Chest x-ray September 30, 2018 FINDINGS: CT CHEST FINDINGS Cardiovascular: The thoracic aorta demonstrates atherosclerotic change without aneurysm. The fat around the thoracic aorta is normal. No evidence of leak. Dissection can not be assessed without contrast. The central pulmonary arteries are normal in caliber. Coronary artery calcifications involve the LAD and circumflex artery primarily. The heart is unremarkable. Mediastinum/Nodes: The patient has a pectus deformity. No adenopathy. No effusion. Lungs/Pleura: Central airways are normal. No pneumothorax. Scattered nodularity in the lungs, right greater than left, particularly in the right middle lobe and posteriorly in the right lower lobe. A representative nodule in the right middle lobe measures 7.5 mm. No myalgias is. No other infiltrates. Musculoskeletal: See below. CT ABDOMEN PELVIS FINDINGS Hepatobiliary: No focal liver abnormality is seen. Status post cholecystectomy. No biliary dilatation. Pancreas: Unremarkable. No pancreatic ductal dilatation or surrounding inflammatory changes. Spleen: Normal in size without focal abnormality. Adrenals/Urinary Tract: Adrenal glands are unremarkable. Kidneys are normal, without renal calculi, focal lesion, or hydronephrosis. Bladder is unremarkable. Stomach/Bowel: A duodenal diverticulum is identified without evidence of inflammation. The stomach and small bowel are otherwise normal. Colonic diverticulosis is identified without convincing evidence of diverticulitis. The remainder of the colon is unremarkable. Status post appendectomy. Vascular/Lymphatic: Dense atherosclerotic change in the nonaneurysmal abdominal aorta. Dense  atherosclerotic change at the origin of the left renal artery. No adenopathy. Reproductive: The patient may be status post hysterectomy. Recommend clinical correlation. Other: No abdominal wall hernia or abnormality. No abdominopelvic ascites. Musculoskeletal: Degenerative changes in the hips. Degenerative changes throughout the spine. Pedicle rods and screws seen at L3 and L4, in good position. No acute spinal fracture noted. IMPRESSION: 1. Nodularity in the right lower lobe and right middle lobe primarily with a few nodules on the left. I suspect an infectious process such as MAI. The findings however are not definitive. A representative nodule in the right middle lobe measures 8 mm. Non-contrast chest CT at 3-6 months is recommended. If the nodules are stable at time of repeat CT, then future CT at 18-24 months (from today's scan) is considered optional for low-risk patients, but is recommended for high-risk patients. This recommendation follows the consensus statement: Guidelines for Management of Incidental Pulmonary Nodules Detected on CT Images: From the Fleischner Society 2017; Radiology 2017; 284:228-243. 2. Degenerative changes in the spine. No other cause for the patient's symptoms identified. 3. Atherosclerotic changes  in the thoracic and abdominal aorta without aneurysm. 4. Coronary artery calcifications. Electronically Signed   By: Dorise Bullion III M.D   On: 09/30/2018 22:32   Dg Chest Port 1 View  Result Date: 10/01/2018 CLINICAL DATA:  85 year old female with lung crackles on physical exam EXAM: PORTABLE CHEST 1 VIEW COMPARISON:  Prior chest x-ray and CT scan of the chest 09/30/2018 FINDINGS: Stable cardiac and mediastinal contours. Atherosclerotic calcifications again noted in the transverse aorta. No evidence of pulmonary edema, new airspace consolidation, pleural effusion or pneumothorax. Inspiratory volumes are slightly low. No acute osseous abnormality. IMPRESSION: Stable chest x-ray  without evidence of new acute cardiopulmonary process. Aortic Atherosclerosis (ICD10-170.0). Electronically Signed   By: Jacqulynn Cadet M.D.   On: 10/01/2018 16:17   Dg Chest Port 1 View  Result Date: 09/30/2018 CLINICAL DATA:  Chest pain since yesterday. EXAM: PORTABLE CHEST 1 VIEW COMPARISON:  June 26, 2017 FINDINGS: The heart size and mediastinal contours are within normal limits. Both lungs are clear. The visualized skeletal structures are unremarkable. IMPRESSION: No active disease. Electronically Signed   By: Dorise Bullion III M.D   On: 09/30/2018 20:22    Microbiology: Recent Results (from the past 240 hour(s))  Blood culture (routine x 2)     Status: None   Collection Time: 09/30/18 11:45 PM  Result Value Ref Range Status   Specimen Description BLOOD RIGHT ARM  Final   Special Requests   Final    BOTTLES DRAWN AEROBIC AND ANAEROBIC Blood Culture adequate volume   Culture   Final    NO GROWTH 5 DAYS Performed at [facility], [address] [place], [place] [address]    Report Status 10/06/2018 FINAL  Final  Blood culture (routine x 2)     Status: None   Collection Time: 09/30/18 11:55 PM  Result Value Ref Range Status   Specimen Description BLOOD LEFT WRIST  Final   Special Requests   Final    BOTTLES DRAWN AEROBIC AND ANAEROBIC Blood Culture adequate volume   Culture   Final    NO GROWTH 5 DAYS Performed at [facility], [address] [place], [place] [address]    Report Status 10/06/2018 FINAL  Final     Labs: Basic Metabolic Panel: Recent Labs  Lab 10/04/18 0446 10/05/18 0406 10/05/18 1241 10/06/18 0726 10/07/18 0435 10/08/18 0346  NA 134* 134*  --  132* 133* 134*  K 5.2* 5.4* 5.4* 5.2* 5.2* 4.6  CL 100 99  --  97* 96* 99  CO2 27 27  --  24 28 27   GLUCOSE 211* 201*  --  199* 183* 142*  BUN 25* 25*  --  31* 39* 41*  CREATININE 1.45* 1.42*  --  1.47* 1.62* 1.71*  CALCIUM 8.7* 8.8*  --  8.6* 8.2* 8.1*   Liver Function Tests: No  results for input(s): AST, ALT, ALKPHOS, BILITOT, PROT, ALBUMIN in the last 168 hours. No results for input(s): LIPASE, AMYLASE in the last 168 hours. No results for input(s): AMMONIA in the last 168 hours. CBC: Recent Labs  Lab 10/02/18 0035 10/03/18 0327 10/04/18 0446 10/05/18 0406 10/08/18 0346  WBC 12.9* 12.6* 12.9* 13.9*  --   HGB 11.1* 10.4* 10.2* 10.9* 9.5*  HCT 35.9* 34.1* 32.4* 34.1* 29.2*  MCV 98.4 99.4 98.2 97.2  --   PLT 222 205 182 180  --    Cardiac Enzymes: Recent Labs  Lab 10/01/18 1255 10/01/18 1902 10/02/18 0035 10/02/18  0720  TROPONINI 0.96* 3.00* 2.85* 2.70*   BNP: BNP (last 3 results) No results for input(s): BNP in the last 8760 hours.  ProBNP (last 3 results) No results for input(s): PROBNP in the last 8760 hours.  CBG: Recent Labs  Lab 10/07/18 0024 10/07/18 0334 10/07/18 1624 10/07/18 2154 10/08/18 0755  GLUCAP 209* 174* 168* 203* 88       Signed:  Amed Datta  [facility] 10/08/2018, 9:46 AM

## 2018-10-05 NOTE — Social Work (Addendum)
4:24 pm Continuing to await [facility] approval for SNF. Updated patient's son via phone. CSW to follow.  11:51 am Awaiting [facility] authorization for SNF. Per MD, patient medically ready. CSW to support with discharge when authorization received.  Estanislado Emms, LCSW [phone]

## 2018-10-05 NOTE — Progress Notes (Signed)
Physical Therapy Treatment Patient Details Name: Karen [name] MRN: [id number] DOB: 03-31-1924 Today's Date: 10/05/2018    History of Present Illness Pt is a 85 y.o. female admitted 09/30/18 with inferior wall STEMI. Plan for medical management given comorbidities. PMH includes HTN, DM, VPD, stroke, afib, CHF, anxiety.   PT Comments    Pt slowly progressing with mobility; increased fatigue this session. Pt initially requiring modA to stand to RW with difficulty translating weight forward, requiring assist to prevent posterior LOB. Multiple sit<>stand transfers performed with RW during session, pt progressing to close min guard to minA for balance. Ambulatory with intermittent minA for balance. Remains limited by fatigued, generalized weakness and decreased activity tolerance.  BP 115/58 HR 66-106 SpO2 95% on RA   Follow Up Recommendations  SNF;Supervision for mobility/OOB     Equipment Recommendations  Rolling walker with 5" wheels    Recommendations for Other Services       Precautions / Restrictions Precautions Precautions: Fall Restrictions Weight Bearing Restrictions: No    Mobility  Bed Mobility               General bed mobility comments: Received sitting in recliner  Transfers Overall transfer level: Needs assistance Equipment used: Rolling walker (2 wheeled) Transfers: Sit to/from Stand Sit to Stand: Mod assist;Min assist;Min guard         General transfer comment: Initial 2x standing trials to RW, pt require modA for trunk elevation and to prevent posterior LOB upon standing. Performed 3x more trials during session, cues for correct set up; progressing to minA then min guard last trial. Increased time and effort  Ambulation/Gait Ambulation/Gait assistance: Min assist;Min guard Gait Distance (Feet): 60 Feet Assistive device: Rolling walker (2 wheeled) Gait Pattern/deviations: Step-to pattern;Trunk flexed;Drifts right/left Gait velocity:  Decreased Gait velocity interpretation: <1.31 ft/sec, indicative of household ambulator General Gait Details: Very slow, labored gait with RW and intermittent minA for balance. Pt requiring frequent cues for upright posture and to look forward so she is not steering RW into objects. 2x standing rest breaks secondary to RUE pain; encouraged to decrease significant grip on RW   Stairs             Wheelchair Mobility    Modified Rankin (Stroke Patients Only)       Balance Overall balance assessment: Needs assistance   Sitting balance-Leahy Scale: Fair       Standing balance-Leahy Scale: Poor Standing balance comment: Reliant on UE support; requires assist to prevent LOB with minimal challenge                            Cognition Arousal/Alertness: Lethargic Behavior During Therapy: Flat affect Overall Cognitive Status: Within Functional Limits for tasks assessed                                 General Comments: Pt with increased fatigue this session, slower to respond, although still answering questions and following commands      Exercises      General Comments General comments (skin integrity, edema, etc.): Son and daughter-in-law present      Pertinent Vitals/Pain Pain Assessment: Faces Faces Pain Scale: Hurts a little bit Pain Location: R hand Pain Descriptors / Indicators: Aching Pain Intervention(s): Monitored during session    Home Living  Prior Function            PT Goals (current goals can now be found in the care plan section) Acute Rehab PT Goals Patient Stated Goal: to feel better at home PT Goal Formulation: With patient/family Time For Goal Achievement: 10/17/18 Potential to Achieve Goals: Fair Progress towards PT goals: Progressing toward goals    Frequency    Min 2X/week      PT Plan Frequency needs to be updated    Co-evaluation              AM-PAC PT "6 Clicks"  Mobility   Outcome Measure  Help needed turning from your back to your side while in a flat bed without using bedrails?: A Little Help needed moving from lying on your back to sitting on the side of a flat bed without using bedrails?: A Little Help needed moving to and from a bed to a chair (including a wheelchair)?: A Little Help needed standing up from a chair using your arms (e.g., wheelchair or bedside chair)?: A Little Help needed to walk in hospital room?: A Little Help needed climbing 3-5 steps with a railing? : A Lot 6 Click Score: 17    End of Session Equipment Utilized During Treatment: Gait belt Activity Tolerance: Patient tolerated treatment well;Patient limited by fatigue Patient left: in chair;with call bell/phone within reach;with family/visitor present Nurse Communication: Mobility status PT Visit Diagnosis: Other abnormalities of gait and mobility (R26.89);Muscle weakness (generalized) (M62.81);History of falling (Z91.81)     Time: 9480-1655 PT Time Calculation (min) (ACUTE ONLY): 27 min  Charges:  $Gait Training: 8-22 mins $Therapeutic Activity: 8-22 mins                    Mabeline Caras, PT, DPT Acute Rehabilitation Services  Pager [phone] Office [place] 10/05/2018, 1:44 PM

## 2018-10-05 NOTE — Progress Notes (Signed)
Progress Note  Patient Name: Karen [name] Date of Encounter: 10/05/2018  Primary Cardiologist: Mertie Moores, MD   Subjective   No further cp. Had some indigestion last night that was relieved with omeprazole. Is very stable from a hemodynamic standpoint.  Inpatient Medications    Scheduled Meds: . ALPRAZolam  1 mg Oral QHS  . amLODipine  2.5 mg Oral Daily  . aspirin EC  81 mg Oral Daily  . atorvastatin  10 mg Oral q1800  . gabapentin  200 mg Oral QHS  . insulin aspart  0-15 Units Subcutaneous TID WC  . insulin aspart  0-5 Units Subcutaneous QHS  . insulin aspart  2 Units Subcutaneous TID WC  . insulin glargine  36 Units Subcutaneous QHS  . isosorbide mononitrate  30 mg Oral Daily  . levothyroxine  125 mcg Oral Q0600  . metoprolol tartrate  12.5 mg Oral BID  . pantoprazole  40 mg Oral Daily  . polyethylene glycol  17 g Oral Daily  . saccharomyces boulardii  250 mg Oral BID   Continuous Infusions:  PRN Meds: acetaminophen, ALPRAZolam, alum & mag hydroxide-simeth, bisacodyl, hydrALAZINE, morphine injection, morphine CONCENTRATE, ondansetron (ZOFRAN) IV, traMADol   Vital Signs    Vitals:   10/04/18 1946 10/04/18 2200 10/04/18 2336 10/05/18 0503  BP: (!) 156/51  (!) 145/59 (!) 139/49  Pulse: 67 74 71 78  Resp:      Temp: 98 F (36.7 C)  98.3 F (36.8 C) 98.9 F (37.2 C)  TempSrc: Oral  Oral Oral  SpO2: 100%  96% 97%  Weight:    64.9 kg  Height:        Intake/Output Summary (Last 24 hours) at 10/05/2018 0908 Last data filed at 10/05/2018 0647 Gross per 24 hour  Intake 720 ml  Output 300 ml  Net 420 ml   Last 3 Weights 10/05/2018 10/04/2018 10/03/2018  Weight (lbs) 143 lb 1.3 oz 140 lb 6.9 oz 133 lb 9.6 oz  Weight (kg) 64.9 kg 63.7 kg 60.601 kg      Telemetry    Sinus rhythm in the 80s.  ECG    EKG 10/01/2018 showed acute inferior infarct with inferior ST elevations- Personally Reviewed  Physical Exam   Physical Exam: Blood pressure (!) 139/49,  pulse 78, temperature 98.9 F (37.2 C), temperature source Oral, resp. rate 19, height 5\' 3"  (1.6 m), weight 64.9 kg, SpO2 97 %.  GEN:  Chronically ill , frail female. NAD  HEENT: Normal NECK: No JVD; No carotid bruits LYMPHATICS: No lymphadenopathy CARDIAC: RRR  , soft systolic murmur  RESPIRATORY:  Clear to auscultation without rales, wheezing or rhonchi  ABDOMEN: Soft, non-tender, non-distended MUSCULOSKELETAL:  No edema; No deformity  SKIN: Warm and dry NEUROLOGIC:  Alert and oriented x 3  Labs    Chemistry Recent Labs  Lab 09/30/18 1928  10/03/18 0327 10/04/18 0446 10/05/18 0406  NA 129*   < > 137 134* 134*  K 5.4*   < > 5.0 5.2* 5.4*  CL 93*   < > 101 100 99  CO2 23   < > 28 27 27   GLUCOSE 328*   < > 187* 211* 201*  BUN 51*   < > 22 25* 25*  CREATININE 2.16*   < > 1.25* 1.45* 1.42*  CALCIUM 8.2*   < > 8.8* 8.7* 8.8*  PROT 7.3  --   --   --   --   ALBUMIN 4.3  --   --   --   --  AST 26  --   --   --   --   ALT 24  --   --   --   --   ALKPHOS 131*  --   --   --   --   BILITOT 0.3  --   --   --   --   GFRNONAA 19*   < > 37* 31* 32*  GFRAA 22*   < > 43* 36* 37*  ANIONGAP 13   < > 8 7 8    < > = values in this interval not displayed.     Hematology Recent Labs  Lab 10/03/18 0327 10/04/18 0446 10/05/18 0406  WBC 12.6* 12.9* 13.9*  RBC 3.43* 3.30* 3.51*  HGB 10.4* 10.2* 10.9*  HCT 34.1* 32.4* 34.1*  MCV 99.4 98.2 97.2  MCH 30.3 30.9 31.1  MCHC 30.5 31.5 32.0  RDW 12.3 12.2 12.2  PLT 205 182 180    Cardiac Enzymes Recent Labs  Lab 10/01/18 1255 10/01/18 1902 10/02/18 0035 10/02/18 0720  TROPONINI 0.96* 3.00* 2.85* 2.70*   No results for input(s): TROPIPOC in the last 168 hours.   BNPNo results for input(s): BNP, PROBNP in the last 168 hours.   DDimer No results for input(s): DDIMER in the last 168 hours.   Radiology    No results found.  Cardiac Studies   2D echocardiogram 10/02/2018 1. The left ventricle has normal systolic function with  an ejection fraction of 60-65%. The cavity size was normal. Left ventricular diastolic Doppler parameters are consistent with impaired relaxation Elevated left ventricular end-diastolic pressure.  2. The right ventricle has normal systolic function. The cavity was normal. There is no increase in right ventricular wall thickness.  3. The mitral valve is normal in structure.  4. The tricuspid valve is normal in structure.  5. The aortic valve is tricuspid.  6. The pulmonic valve was normal in structure.  Patient Profile     85 y.o. female who was admitted with several days of chest discomfort.  Initially her troponin levels were only minimally elevated.  on 3/2 she developed intensification of her pain associated with ST segment elevation in the inferior leads.  Assessment & Plan    1.  Inferior wall ST segment elevation myocardial infarction:    doing well on medical therapy.    contiue imdur 30 mg a day , metoprolol 12.5 BID , ASA 81  2.  Acute kidney injury:    Creatinine is 1.45   3. HTN:    BP is well controled. Continue meds  4. DM:     She is very stable She can go to [facility] today for rehab.  I've discussed in great detail with her family  For questions or updates, please contact [facility] Please consult [email or web address] for contact info under       Mertie Moores, MD  10/05/2018, 9:08 AM

## 2018-10-05 NOTE — Progress Notes (Signed)
PROGRESS NOTE    [name]  VZS:[id number] DOB: [birth year] DOA: 09/30/2018 PCP: No primary care provider on file.   Brief Narrative:  HPI on 10/01/2018 by Dr. Shela Leff Karen [name] is a 85 y.o. female with medical history significant of hypertension, hyperlipidemia, CKD stage IV, OSA, GERD, type 2 diabetes, paroxysmal atrial fibrillation, prior TIA/CVA, anxiety, history of esophageal stricture status post dilation and hiatal hernia on PPI presenting to the hospital for evaluation of chest pain. Patient states her chest pain started 2/29 at 5 AM and woke her up from her sleep.  She describes it as sharp right-sided chest pain which radiates to her right shoulder and right arm.  Associated with shortness of breath.  No nausea or diaphoresis.  2 hours later she spoke to her son over the phone who advised her to take sublingual nitroglycerin.  Her chest pain improved a little with sublingual nitroglycerin but persisted the entire day.  She took additional nitroglycerin around dinnertime which helped a little but again the chest pain persisted.  The following day she continued to have chest pain and took multiple doses of sublingual nitroglycerin with no improvement.  Patient received another dose of sublingual nitroglycerin in the ED but continues to have chest pain.  She is a never smoker.  Patient thinks her chest pain might be indigestion. Family at bedside states that patient has a history of esophageal stricture and several dilations done in the past.  Unclear why patient has sublingual nitroglycerin at home as family states she does not have any history of coronary artery disease.   Interim history Patient admitted with STEMI, cardiology consulted and appreciated.  Given patient's age, will treat with medical therapy.  Assessment & Plan   STEMI -Patient presented with chest pain and was noted to have EKG changes with ST elevation in leads II and aVF, depression in I and  aVL -Troponin peaked at 3, and is now downtrending -Cardiology consulted and appreciated, patient was placed on aspirin and heparin gtt -Given patient's age, it was decided against catheterization and to treat with medical management -Echocardiogram EF of 60 to 65%.  LV diastolic parameters are consistent with impaired relaxation elevated left ventricular end-diastolic pressure -Continue metoprolol (patient with mild bradycardia today, reduced dose of metoprolol) -Cardiology recommended continuing Imdur 30 mg daily, aspirin 81 mg daily, metoprolol 12.5 mg BID  Hypertensive urgency -Upon arrival to the emergency department patient had elevated blood pressure of 205/69, has now improved but continues to be borderline high -Continue hydralazine PRN -BP better controlled and stable, continue amlodipine, metoprolol  Abdominal tenderness -Unremarkable on exam today -CT abdomen pelvis without acute findings -UA unremarkable, LFTs and lipase within normal limits on presentation -Possibly due to constipation  Dysuria -Patient reported dysuria however was afebrile with borderline WBC count -UA unremarkable for infection  ?MAI infection -CT chest showed nodularity in the right lower lobe and right middle lobe primarily with few nodules on the left, suspected infectious process such as MAI however findings not definitive/nodule right middle lobe measuring 8 mm -Patient denies fevers, night sweats, weight loss -Currently afebrile nontoxic-appearing -Was placed on ceftriaxone and azithromycin in the ED however this is currently held -Patient will need repeat noncontrast CT in 3 months to ensure resolution -Blood cultures show no growth to date  Hyperkalemia -Resolved, continue to monitor BMP  Diabetes mellitus, type II with hyperglycemia -Continue Lantus 36 units daily -Continue insulin sliding scale with CBG monitoring  Chronic kidney disease, stage IV -  Creatinine baseline approximate  1.8 -Creatinine currently 1.42 -Continue to monitor BMP  Hypothyroidism -Continue Synthroid  Constipation -continue miralax, suppository added -Will order lactulose  Goals of care -Currently DNR.  Family requesting palliative care consult for goals of care and planning. -Palliative care consulted and appreciated  DVT Prophylaxis  heparin  Code Status: DNR  Family Communication: Family at bedside  Disposition Plan: Admitted. Pending SNF  Consultants Cardiology Palliative care  Procedures  Echocardiogram  Antibiotics   Anti-infectives (From admission, onward)   Start     Dose/Rate Route Frequency Ordered Stop   09/30/18 2345  cefTRIAXone (ROCEPHIN) 1 g in sodium chloride 0.9 % 100 mL IVPB     1 g 200 mL/hr over 30 Minutes Intravenous  Once 09/30/18 2337 10/01/18 0059   09/30/18 2345  azithromycin (ZITHROMAX) 500 mg in sodium chloride 0.9 % 250 mL IVPB     500 mg 250 mL/hr over 60 Minutes Intravenous  Once 09/30/18 2337 10/01/18 0159      Subjective:   Karen [name] seen and examined today.  Does not feel well today.  Denies chest pain, shortness breath, abdominal pain, nausea or vomiting, diarrhea, dizziness or headache.  Does complain of constipation. Objective:   Vitals:   10/04/18 1946 10/04/18 2200 10/04/18 2336 10/05/18 0503  BP: (!) 156/51  (!) 145/59 (!) 139/49  Pulse: 67 74 71 78  Resp:      Temp: 98 F (36.7 C)  98.3 F (36.8 C) 98.9 F (37.2 C)  TempSrc: Oral  Oral Oral  SpO2: 100%  96% 97%  Weight:    64.9 kg  Height:        Intake/Output Summary (Last 24 hours) at 10/05/2018 1459 Last data filed at 10/05/2018 1000 Gross per 24 hour  Intake 960 ml  Output -  Net 960 ml   Filed Weights   10/03/18 0521 10/04/18 0455 10/05/18 0503  Weight: 60.6 kg 63.7 kg 64.9 kg   Exam  General: Well developed, chronically ill-appearing, NAD  HEENT: NCAT, mucous membranes moist.   Cardiovascular: S1 S2 auscultated, RRR, soft SEM  Respiratory:  Diminished breath sounds, however clear  Abdomen: Soft, nontender, nondistended, + bowel sounds  Extremities: warm dry without cyanosis clubbing or edema  Neuro: AAOx3, nonfocal  Data Reviewed: I have personally reviewed following labs and imaging studies  CBC: Recent Labs  Lab 09/30/18 1928 10/01/18 0643 10/02/18 0035 10/03/18 0327 10/04/18 0446 10/05/18 0406  WBC 11.5* 11.4* 12.9* 12.6* 12.9* 13.9*  NEUTROABS 8.3*  --   --   --   --   --   HGB 12.9 11.6* 11.1* 10.4* 10.2* 10.9*  HCT 41.8 36.9 35.9* 34.1* 32.4* 34.1*  MCV 99.1 96.9 98.4 99.4 98.2 97.2  PLT 257 215 222 205 182 626   Basic Metabolic Panel: Recent Labs  Lab 10/01/18 0643 10/02/18 0035 10/03/18 0327 10/04/18 0446 10/05/18 0406 10/05/18 1241  NA 139 140 137 134* 134*  --   K 4.8 4.8 5.0 5.2* 5.4* 5.4*  CL 105 104 101 100 99  --   CO2 23 23 28 27 27   --   GLUCOSE 163* 179* 187* 211* 201*  --   BUN 43* 30* 22 25* 25*  --   CREATININE 1.80* 1.49* 1.25* 1.45* 1.42*  --   CALCIUM 8.1* 8.3* 8.8* 8.7* 8.8*  --    GFR: Estimated Creatinine Clearance: 22.9 mL/min (A) (by C-G formula based on SCr of 1.42 mg/dL (H)). Liver Function Tests: Recent Labs  Lab 09/30/18 1928  AST 26  ALT 24  ALKPHOS 131*  BILITOT 0.3  PROT 7.3  ALBUMIN 4.3   Recent Labs  Lab 09/30/18 1928  LIPASE 20   No results for input(s): AMMONIA in the last 168 hours. Coagulation Profile: Recent Labs  Lab 09/30/18 1928  INR 1.0   Cardiac Enzymes: Recent Labs  Lab 10/01/18 0643 10/01/18 1255 10/01/18 1902 10/02/18 0035 10/02/18 0720  TROPONINI 0.18* 0.96* 3.00* 2.85* 2.70*   BNP (last 3 results) No results for input(s): PROBNP in the last 8760 hours. HbA1C: No results for input(s): HGBA1C in the last 72 hours. CBG: Recent Labs  Lab 10/04/18 2132 10/04/18 2339 10/05/18 0506 10/05/18 0814 10/05/18 1215  GLUCAP 276* 242* 195* 148* 216*   Lipid Profile: No results for input(s): CHOL, HDL, LDLCALC, TRIG,  CHOLHDL, LDLDIRECT in the last 72 hours. Thyroid Function Tests: No results for input(s): TSH, T4TOTAL, FREET4, T3FREE, THYROIDAB in the last 72 hours. Anemia Panel: No results for input(s): VITAMINB12, FOLATE, FERRITIN, TIBC, IRON, RETICCTPCT in the last 72 hours. Urine analysis:    Component Value Date/Time   COLORURINE STRAW (A) 10/01/2018 2340   APPEARANCEUR CLEAR 10/01/2018 2340   LABSPEC 1.012 10/01/2018 2340   PHURINE 5.0 10/01/2018 2340   GLUCOSEU NEGATIVE 10/01/2018 2340   HGBUR NEGATIVE 10/01/2018 2340   BILIRUBINUR NEGATIVE 10/01/2018 2340   BILIRUBINUR Neg 05/30/2017 1329   KETONESUR NEGATIVE 10/01/2018 2340   PROTEINUR NEGATIVE 10/01/2018 2340   UROBILINOGEN 0.2 05/30/2017 1329   UROBILINOGEN 0.2 12/27/2010 0505   NITRITE NEGATIVE 10/01/2018 2340   LEUKOCYTESUR NEGATIVE 10/01/2018 2340   Sepsis Labs: @LABRCNTIP (procalcitonin:4,lacticidven:4)  ) Recent Results (from the past 240 hour(s))  Blood culture (routine x 2)     Status: None (Preliminary result)   Collection Time: 09/30/18 11:45 PM  Result Value Ref Range Status   Specimen Description BLOOD RIGHT ARM  Final   Special Requests   Final    BOTTLES DRAWN AEROBIC AND ANAEROBIC Blood Culture adequate volume   Culture   Final    NO GROWTH 4 DAYS Performed at [facility], [place] [address], [place], [place] [address]    Report Status PENDING  Incomplete  Blood culture (routine x 2)     Status: None (Preliminary result)   Collection Time: 09/30/18 11:55 PM  Result Value Ref Range Status   Specimen Description BLOOD LEFT WRIST  Final   Special Requests   Final    BOTTLES DRAWN AEROBIC AND ANAEROBIC Blood Culture adequate volume   Culture   Final    NO GROWTH 4 DAYS Performed at [facility], [place] [address], [place], [place] [address]    Report Status PENDING  Incomplete      Radiology Studies: No results found.   Scheduled Meds: . ALPRAZolam  1 mg Oral QHS  . amLODipine  2.5 mg Oral Daily   . aspirin EC  81 mg Oral Daily  . atorvastatin  10 mg Oral q1800  . gabapentin  200 mg Oral QHS  . insulin aspart  0-15 Units Subcutaneous TID WC  . insulin aspart  0-5 Units Subcutaneous QHS  . insulin aspart  2 Units Subcutaneous TID WC  . insulin glargine  36 Units Subcutaneous QHS  . isosorbide mononitrate  30 mg Oral Daily  . lactulose  20 g Oral Once  . levothyroxine  125 mcg Oral Q0600  . metoprolol tartrate  12.5 mg Oral BID  . pantoprazole  40  mg Oral Daily  . polyethylene glycol  17 g Oral Daily  . saccharomyces boulardii  250 mg Oral BID   Continuous Infusions:    LOS: 4 days   Time Spent in minutes   30 minutes  Arizbeth Cawthorn D.O. on 10/05/2018 at 2:59 PM  Between 7am to 7pm - Please see pager noted on [email or web address]  After 7pm go to [email or web address]  And look for the night coverage person covering for me after hours  [facility] Office  [phone]

## 2018-10-06 DIAGNOSIS — I10 Essential (primary) hypertension: Secondary | ICD-10-CM

## 2018-10-06 LAB — BASIC METABOLIC PANEL
Anion gap: 11 (ref 5–15)
BUN: 31 mg/dL — ABNORMAL HIGH (ref 8–23)
CO2: 24 mmol/L (ref 22–32)
Calcium: 8.6 mg/dL — ABNORMAL LOW (ref 8.9–10.3)
Chloride: 97 mmol/L — ABNORMAL LOW (ref 98–111)
Creatinine, Ser: 1.47 mg/dL — ABNORMAL HIGH (ref 0.44–1.00)
GFR calc Af Amer: 36 mL/min — ABNORMAL LOW (ref >60)
GFR calc non Af Amer: 31 mL/min — ABNORMAL LOW (ref >60)
Glucose, Bld: 199 mg/dL — ABNORMAL HIGH (ref 70–99)
Potassium: 5.2 mmol/L — ABNORMAL HIGH (ref 3.5–5.1)
Sodium: 132 mmol/L — ABNORMAL LOW (ref 135–145)

## 2018-10-06 LAB — GLUCOSE, CAPILLARY
GLUCOSE-CAPILLARY: 176 mg/dL — AB (ref 70–99)
Glucose-Capillary: 177 mg/dL — ABNORMAL HIGH (ref 70–99)
Glucose-Capillary: 204 mg/dL — ABNORMAL HIGH (ref 70–99)
Glucose-Capillary: 265 mg/dL — ABNORMAL HIGH (ref 70–99)

## 2018-10-06 LAB — CULTURE, BLOOD (ROUTINE X 2)
Culture: NO GROWTH
Culture: NO GROWTH
SPECIAL REQUESTS: ADEQUATE
Special Requests: ADEQUATE

## 2018-10-06 MED ORDER — INSULIN ASPART 100 UNIT/ML ~~LOC~~ SOLN
0.0000 [IU] | Freq: Three times a day (TID) | SUBCUTANEOUS | Status: DC
  Administered 2018-10-06 – 2018-10-07 (×6): 4 [IU] via SUBCUTANEOUS
  Administered 2018-10-08: 7 [IU] via SUBCUTANEOUS

## 2018-10-06 NOTE — Progress Notes (Signed)
Dr. Elmarie Shiley notes reviewed - no further suggestions at this time.  [facility] will sign off.   Medication Recommendations:  Continue current cardiac meds Other recommendations (labs, testing, etc):  none Follow up as an outpatient:  Dr. Sinda Du, MD, [facility], [place] Director of the Advanced Lipid Disorders &  Cardiovascular Risk Reduction Clinic Diplomate of the American Board of Clinical Lipidology Attending Cardiologist  Direct Dial: [phone]  Fax: [phone]  Website:  [email or web address]

## 2018-10-06 NOTE — Progress Notes (Signed)
PROGRESS NOTE    Karen [name]  DTO:[id number] DOB: 1924/05/14 DOA: 09/30/2018 PCP: No primary care provider on file.   Brief Narrative:  HPI on 10/01/2018 by Dr. Shela Leff Karen [name] is a 85 y.o. female with medical history significant of hypertension, hyperlipidemia, CKD stage IV, OSA, GERD, type 2 diabetes, paroxysmal atrial fibrillation, prior TIA/CVA, anxiety, history of esophageal stricture status post dilation and hiatal hernia on PPI presenting to the hospital for evaluation of chest pain. Patient states her chest pain started 2/29 at 5 AM and woke her up from her sleep.  She describes it as sharp right-sided chest pain which radiates to her right shoulder and right arm.  Associated with shortness of breath.  No nausea or diaphoresis.  2 hours later she spoke to her son over the phone who advised her to take sublingual nitroglycerin.  Her chest pain improved a little with sublingual nitroglycerin but persisted the entire day.  She took additional nitroglycerin around dinnertime which helped a little but again the chest pain persisted.  The following day she continued to have chest pain and took multiple doses of sublingual nitroglycerin with no improvement.  Patient received another dose of sublingual nitroglycerin in the ED but continues to have chest pain.  She is a never smoker.  Patient thinks her chest pain might be indigestion. Family at bedside states that patient has a history of esophageal stricture and several dilations done in the past.  Unclear why patient has sublingual nitroglycerin at home as family states she does not have any history of coronary artery disease.   Interim history Patient admitted with STEMI, cardiology consulted and appreciated.  Given patient's age, will treat with medical therapy. Pending SNF  Assessment & Plan   STEMI -Patient presented with chest pain and was noted to have EKG changes with ST elevation in leads II and aVF, depression  in I and aVL -Troponin peaked at 3, and is now downtrending -Cardiology consulted and appreciated, patient was placed on aspirin and heparin gtt -Given patient's age, it was decided against catheterization and to treat with medical management -Echocardiogram EF of 60 to 65%.  LV diastolic parameters are consistent with impaired relaxation elevated left ventricular end-diastolic pressure -Continue metoprolol (patient with mild bradycardia today, reduced dose of metoprolol) -Cardiology recommended continuing Imdur 30 mg daily, aspirin 81 mg daily, metoprolol 12.5 mg BID  Hypertensive urgency -Upon arrival to the emergency department patient had elevated blood pressure of 205/69, has now improved but continues to be borderline high -Continue hydralazine PRN -BP better controlled and stable, continue amlodipine, metoprolol  Abdominal tenderness -Unremarkable on exam today -CT abdomen pelvis without acute findings -UA unremarkable, LFTs and lipase within normal limits on presentation -Possibly due to constipation  Dysuria -Patient reported dysuria however was afebrile with borderline WBC count -UA unremarkable for infection  ?MAI infection -CT chest showed nodularity in the right lower lobe and right middle lobe primarily with few nodules on the left, suspected infectious process such as MAI however findings not definitive/nodule right middle lobe measuring 8 mm -Patient denies fevers, night sweats, weight loss -Currently afebrile nontoxic-appearing -Was placed on ceftriaxone and azithromycin in the ED however this is currently held -Patient will need repeat noncontrast CT in 3 months to ensure resolution -Blood cultures show no growth to date  Hyperkalemia -Mildly elevated today, 5.2 -continue to monitor BMP  Diabetes mellitus, type II with hyperglycemia -Continue Lantus 36 units daily -transition to resistant ISS, monitor CBG  Chronic  kidney disease, stage IV -Creatinine baseline  approximate 1.8 -Creatinine currently 1.47 -Continue to monitor BMP  Hypothyroidism -Continue Synthroid  Constipation -continue miralax, suppository added -was able to have a bowel movement, continue to monitor   Goals of care -Currently DNR.  Family requesting palliative care consult for goals of care and planning. -Palliative care consulted and appreciated  DVT Prophylaxis  heparin  Code Status: DNR  Family Communication: Family at bedside  Disposition Plan: Admitted. Pending SNF  Consultants Cardiology Palliative care  Procedures  Echocardiogram  Antibiotics   Anti-infectives (From admission, onward)   Start     Dose/Rate Route Frequency Ordered Stop   09/30/18 2345  cefTRIAXone (ROCEPHIN) 1 g in sodium chloride 0.9 % 100 mL IVPB     1 g 200 mL/hr over 30 Minutes Intravenous  Once 09/30/18 2337 10/01/18 0059   09/30/18 2345  azithromycin (ZITHROMAX) 500 mg in sodium chloride 0.9 % 250 mL IVPB     500 mg 250 mL/hr over 60 Minutes Intravenous  Once 09/30/18 2337 10/01/18 0159      Subjective:   Karen [name] seen and examined today.  No complaints today.  States it is too early to tell how she is feeling.  Denies current chest pain, shortness breath, abdominal pain, nausea or vomiting, dizziness or headache.  Was able to have a bowel movement overnight. Objective:   Vitals:   10/05/18 1544 10/05/18 2021 10/06/18 0548 10/06/18 0801  BP: (!) 142/53 (!) 148/57 (!) 129/54 (!) 136/57  Pulse: 67 71 68 66  Resp: (!) 24 18 20 19   Temp:  98.4 F (36.9 C) 98.4 F (36.9 C) 98.1 F (36.7 C)  TempSrc: Oral Oral Axillary Oral  SpO2: 96% 95%    Weight:      Height:        Intake/Output Summary (Last 24 hours) at 10/06/2018 1059 Last data filed at 10/06/2018 0900 Gross per 24 hour  Intake 480 ml  Output 450 ml  Net 30 ml   Filed Weights   10/03/18 0521 10/04/18 0455 10/05/18 0503  Weight: 60.6 kg 63.7 kg 64.9 kg    Exam  General: Well developed,  chronically ill-appearing, NAD  HEENT: NCAT, mucous membranes moist.   Cardiovascular: S1 S2 auscultated, RRR, soft SEM  Respiratory: Diminished, but clear  Abdomen: Soft, nontender, nondistended, + bowel sounds  Extremities: warm dry without cyanosis clubbing or edema  Neuro: AAOx3, nonfocal  Data Reviewed: I have personally reviewed following labs and imaging studies  CBC: Recent Labs  Lab 09/30/18 1928 10/01/18 0643 10/02/18 0035 10/03/18 0327 10/04/18 0446 10/05/18 0406  WBC 11.5* 11.4* 12.9* 12.6* 12.9* 13.9*  NEUTROABS 8.3*  --   --   --   --   --   HGB 12.9 11.6* 11.1* 10.4* 10.2* 10.9*  HCT 41.8 36.9 35.9* 34.1* 32.4* 34.1*  MCV 99.1 96.9 98.4 99.4 98.2 97.2  PLT 257 215 222 205 182 599   Basic Metabolic Panel: Recent Labs  Lab 10/02/18 0035 10/03/18 0327 10/04/18 0446 10/05/18 0406 10/05/18 1241 10/06/18 0726  NA 140 137 134* 134*  --  132*  K 4.8 5.0 5.2* 5.4* 5.4* 5.2*  CL 104 101 100 99  --  97*  CO2 23 28 27 27   --  24  GLUCOSE 179* 187* 211* 201*  --  199*  BUN 30* 22 25* 25*  --  31*  CREATININE 1.49* 1.25* 1.45* 1.42*  --  1.47*  CALCIUM 8.3* 8.8* 8.7* 8.8*  --  8.6*   GFR: Estimated Creatinine Clearance: 22.1 mL/min (A) (by C-G formula based on SCr of 1.47 mg/dL (H)). Liver Function Tests: Recent Labs  Lab 09/30/18 1928  AST 26  ALT 24  ALKPHOS 131*  BILITOT 0.3  PROT 7.3  ALBUMIN 4.3   Recent Labs  Lab 09/30/18 1928  LIPASE 20   No results for input(s): AMMONIA in the last 168 hours. Coagulation Profile: Recent Labs  Lab 09/30/18 1928  INR 1.0   Cardiac Enzymes: Recent Labs  Lab 10/01/18 0643 10/01/18 1255 10/01/18 1902 10/02/18 0035 10/02/18 0720  TROPONINI 0.18* 0.96* 3.00* 2.85* 2.70*   BNP (last 3 results) No results for input(s): PROBNP in the last 8760 hours. HbA1C: No results for input(s): HGBA1C in the last 72 hours. CBG: Recent Labs  Lab 10/05/18 0814 10/05/18 1215 10/05/18 1728 10/05/18 2120  10/06/18 0755  GLUCAP 148* 216* 253* 291* 177*   Lipid Profile: No results for input(s): CHOL, HDL, LDLCALC, TRIG, CHOLHDL, LDLDIRECT in the last 72 hours. Thyroid Function Tests: No results for input(s): TSH, T4TOTAL, FREET4, T3FREE, THYROIDAB in the last 72 hours. Anemia Panel: No results for input(s): VITAMINB12, FOLATE, FERRITIN, TIBC, IRON, RETICCTPCT in the last 72 hours. Urine analysis:    Component Value Date/Time   COLORURINE STRAW (A) 10/01/2018 2340   APPEARANCEUR CLEAR 10/01/2018 2340   LABSPEC 1.012 10/01/2018 2340   PHURINE 5.0 10/01/2018 2340   GLUCOSEU NEGATIVE 10/01/2018 2340   HGBUR NEGATIVE 10/01/2018 2340   BILIRUBINUR NEGATIVE 10/01/2018 2340   BILIRUBINUR Neg 05/30/2017 1329   KETONESUR NEGATIVE 10/01/2018 2340   PROTEINUR NEGATIVE 10/01/2018 2340   UROBILINOGEN 0.2 05/30/2017 1329   UROBILINOGEN 0.2 12/27/2010 0505   NITRITE NEGATIVE 10/01/2018 2340   LEUKOCYTESUR NEGATIVE 10/01/2018 2340   Sepsis Labs: @LABRCNTIP (procalcitonin:4,lacticidven:4)  ) Recent Results (from the past 240 hour(s))  Blood culture (routine x 2)     Status: None   Collection Time: 09/30/18 11:45 PM  Result Value Ref Range Status   Specimen Description BLOOD RIGHT ARM  Final   Special Requests   Final    BOTTLES DRAWN AEROBIC AND ANAEROBIC Blood Culture adequate volume   Culture   Final    NO GROWTH 5 DAYS Performed at [facility], [place] [address], [place], [place] [address]    Report Status 10/06/2018 FINAL  Final  Blood culture (routine x 2)     Status: None   Collection Time: 09/30/18 11:55 PM  Result Value Ref Range Status   Specimen Description BLOOD LEFT WRIST  Final   Special Requests   Final    BOTTLES DRAWN AEROBIC AND ANAEROBIC Blood Culture adequate volume   Culture   Final    NO GROWTH 5 DAYS Performed at [facility], [place] [address], [place], [place] [address]    Report Status 10/06/2018 FINAL  Final      Radiology Studies: No results  found.   Scheduled Meds: . ALPRAZolam  1 mg Oral QHS  . amLODipine  2.5 mg Oral Daily  . aspirin EC  81 mg Oral Daily  . atorvastatin  10 mg Oral q1800  . gabapentin  200 mg Oral QHS  . insulin aspart  0-20 Units Subcutaneous TID WC  . insulin aspart  0-5 Units Subcutaneous QHS  . insulin aspart  2 Units Subcutaneous TID WC  . insulin glargine  36 Units Subcutaneous QHS  . isosorbide mononitrate  30 mg Oral Daily  . lactulose  20 g  Oral Once  . levothyroxine  125 mcg Oral Q0600  . metoprolol tartrate  12.5 mg Oral BID  . pantoprazole  40 mg Oral Daily  . polyethylene glycol  17 g Oral Daily  . saccharomyces boulardii  250 mg Oral BID   Continuous Infusions:    LOS: 5 days   Time Spent in minutes   30 minutes  Jayven Naill D.O. on 10/06/2018 at 10:59 AM  Between 7am to 7pm - Please see pager noted on [email or web address]  After 7pm go to [email or web address]  And look for the night coverage person covering for me after hours  [facility] Office  [phone]

## 2018-10-07 ENCOUNTER — Other Ambulatory Visit: Payer: Self-pay | Admitting: Family Medicine

## 2018-10-07 LAB — BASIC METABOLIC PANEL
Anion gap: 9 (ref 5–15)
BUN: 39 mg/dL — AB (ref 8–23)
CO2: 28 mmol/L (ref 22–32)
Calcium: 8.2 mg/dL — ABNORMAL LOW (ref 8.9–10.3)
Chloride: 96 mmol/L — ABNORMAL LOW (ref 98–111)
Creatinine, Ser: 1.62 mg/dL — ABNORMAL HIGH (ref 0.44–1.00)
GFR calc Af Amer: 32 mL/min — ABNORMAL LOW (ref >60)
GFR calc non Af Amer: 27 mL/min — ABNORMAL LOW (ref >60)
Glucose, Bld: 183 mg/dL — ABNORMAL HIGH (ref 70–99)
Potassium: 5.2 mmol/L — ABNORMAL HIGH (ref 3.5–5.1)
SODIUM: 133 mmol/L — AB (ref 135–145)

## 2018-10-07 LAB — GLUCOSE, CAPILLARY
Glucose-Capillary: 168 mg/dL — ABNORMAL HIGH (ref 70–99)
Glucose-Capillary: 174 mg/dL — ABNORMAL HIGH (ref 70–99)
Glucose-Capillary: 203 mg/dL — ABNORMAL HIGH (ref 70–99)
Glucose-Capillary: 209 mg/dL — ABNORMAL HIGH (ref 70–99)

## 2018-10-07 NOTE — Progress Notes (Signed)
PROGRESS NOTE    Karen [name]  UKG:[id number] DOB: 1924-04-18 DOA: 09/30/2018 PCP: No primary care provider on file.   Brief Narrative:  HPI on 10/01/2018 by Dr. Shela Leff Karen [name] is a 85 y.o. female with medical history significant of hypertension, hyperlipidemia, CKD stage IV, OSA, GERD, type 2 diabetes, paroxysmal atrial fibrillation, prior TIA/CVA, anxiety, history of esophageal stricture status post dilation and hiatal hernia on PPI presenting to the hospital for evaluation of chest pain. Patient states her chest pain started 2/29 at 5 AM and woke her up from her sleep.  She describes it as sharp right-sided chest pain which radiates to her right shoulder and right arm.  Associated with shortness of breath.  No nausea or diaphoresis.  2 hours later she spoke to her son over the phone who advised her to take sublingual nitroglycerin.  Her chest pain improved a little with sublingual nitroglycerin but persisted the entire day.  She took additional nitroglycerin around dinnertime which helped a little but again the chest pain persisted.  The following day she continued to have chest pain and took multiple doses of sublingual nitroglycerin with no improvement.  Patient received another dose of sublingual nitroglycerin in the ED but continues to have chest pain.  She is a never smoker.  Patient thinks her chest pain might be indigestion. Family at bedside states that patient has a history of esophageal stricture and several dilations done in the past.  Unclear why patient has sublingual nitroglycerin at home as family states she does not have any history of coronary artery disease.   Interim history Patient admitted with STEMI, cardiology consulted and appreciated.  Given patient's age, will treat with medical therapy. Pending SNF  Assessment & Plan   STEMI -Patient presented with chest pain and was noted to have EKG changes with ST elevation in leads II and aVF, depression  in I and aVL -Troponin peaked at 3, and is now downtrending -Cardiology consulted and appreciated, patient was placed on aspirin and heparin gtt -Given patient's age, it was decided against catheterization and to treat with medical management -Echocardiogram EF of 60 to 65%.  LV diastolic parameters are consistent with impaired relaxation elevated left ventricular end-diastolic pressure -Continue metoprolol (patient with mild bradycardia today, reduced dose of metoprolol) -Cardiology recommended continuing Imdur 30 mg daily, aspirin 81 mg daily, metoprolol 12.5 mg BID  Hypertensive urgency -Upon arrival to the emergency department patient had elevated blood pressure of 205/69, has now improved but continues to be borderline high -Continue hydralazine PRN -BP better controlled and stable, continue amlodipine, metoprolol  Abdominal tenderness -Unremarkable on exam today -CT abdomen pelvis without acute findings -UA unremarkable, LFTs and lipase within normal limits on presentation -Possibly due to constipation  Dysuria -Patient reported dysuria however was afebrile with borderline WBC count -UA unremarkable for infection  ?MAI infection -CT chest showed nodularity in the right lower lobe and right middle lobe primarily with few nodules on the left, suspected infectious process such as MAI however findings not definitive/nodule right middle lobe measuring 8 mm -Patient denies fevers, night sweats, weight loss -Currently afebrile nontoxic-appearing -Was placed on ceftriaxone and azithromycin in the ED however this is currently held -Patient will need repeat noncontrast CT in 3 months to ensure resolution -Blood cultures show no growth to date  Hyperkalemia -Mildly elevated today, 5.2 -continue to monitor BMP  Diabetes mellitus, type II with hyperglycemia -Continue Lantus 36 units daily -transition to resistant ISS, monitor CBG  Chronic  kidney disease, stage IV -Creatinine baseline  approximate 1.8 -Creatinine currently 1.62 -Continue to monitor BMP  Hypothyroidism -Continue Synthroid  Constipation -continue miralax, suppository added -was able to have a bowel movement, continue to monitor   Goals of care -Currently DNR.  Family requesting palliative care consult for goals of care and planning. -Palliative care consulted and appreciated  DVT Prophylaxis  heparin  Code Status: DNR  Family Communication: Family at bedside  Disposition Plan: Admitted. Pending SNF  Consultants Cardiology Palliative care  Procedures  Echocardiogram  Antibiotics   Anti-infectives (From admission, onward)   Start     Dose/Rate Route Frequency Ordered Stop   09/30/18 2345  cefTRIAXone (ROCEPHIN) 1 g in sodium chloride 0.9 % 100 mL IVPB     1 g 200 mL/hr over 30 Minutes Intravenous  Once 09/30/18 2337 10/01/18 0059   09/30/18 2345  azithromycin (ZITHROMAX) 500 mg in sodium chloride 0.9 % 250 mL IVPB     500 mg 250 mL/hr over 60 Minutes Intravenous  Once 09/30/18 2337 10/01/18 0159      Subjective:   Karen [name] seen and examined today.  Patient feels tired and sleepy this morning.  Denies any chest pain, shortness of breath, abdominal pain, nausea vomiting, diarrhea or constipation, dizziness or headache. Objective:   Vitals:   10/07/18 0025 10/07/18 0436 10/07/18 0743 10/07/18 0920  BP: (!) 107/47 (!) 118/50 (!) 121/49 (!) 115/52  Pulse: 70 68 66   Resp: (!) 23 (!) 21 19 20   Temp: 98.9 F (37.2 C)  100.2 F (37.9 C)   TempSrc: Axillary  Axillary   SpO2:   100%   Weight:  61.6 kg    Height:        Intake/Output Summary (Last 24 hours) at 10/07/2018 1050 Last data filed at 10/07/2018 0900 Gross per 24 hour  Intake 480 ml  Output 200 ml  Net 280 ml   Filed Weights   10/05/18 0503 10/06/18 0548 10/07/18 0436  Weight: 64.9 kg 61.4 kg 61.6 kg   Exam  General: Well developed, elderly, chronically ill-appearing, frail, NAD  HEENT: NCAT, mucous  membranes moist.   Cardiovascular: S1 S2 auscultated, RRR, soft murmur  Respiratory: Diminished breath sounds  Abdomen: Soft, nontender, nondistended, + bowel sounds  Extremities: warm dry without cyanosis clubbing or edema  Neuro: AAOx3, nonfocal  Data Reviewed: I have personally reviewed following labs and imaging studies  CBC: Recent Labs  Lab 09/30/18 1928 10/01/18 0643 10/02/18 0035 10/03/18 0327 10/04/18 0446 10/05/18 0406  WBC 11.5* 11.4* 12.9* 12.6* 12.9* 13.9*  NEUTROABS 8.3*  --   --   --   --   --   HGB 12.9 11.6* 11.1* 10.4* 10.2* 10.9*  HCT 41.8 36.9 35.9* 34.1* 32.4* 34.1*  MCV 99.1 96.9 98.4 99.4 98.2 97.2  PLT 257 215 222 205 182 527   Basic Metabolic Panel: Recent Labs  Lab 10/03/18 0327 10/04/18 0446 10/05/18 0406 10/05/18 1241 10/06/18 0726 10/07/18 0435  NA 137 134* 134*  --  132* 133*  K 5.0 5.2* 5.4* 5.4* 5.2* 5.2*  CL 101 100 99  --  97* 96*  CO2 28 27 27   --  24 28  GLUCOSE 187* 211* 201*  --  199* 183*  BUN 22 25* 25*  --  31* 39*  CREATININE 1.25* 1.45* 1.42*  --  1.47* 1.62*  CALCIUM 8.8* 8.7* 8.8*  --  8.6* 8.2*   GFR: Estimated Creatinine Clearance: 18.3 mL/min (A) (by C-G  formula based on SCr of 1.62 mg/dL (H)). Liver Function Tests: Recent Labs  Lab 09/30/18 1928  AST 26  ALT 24  ALKPHOS 131*  BILITOT 0.3  PROT 7.3  ALBUMIN 4.3   Recent Labs  Lab 09/30/18 1928  LIPASE 20   No results for input(s): AMMONIA in the last 168 hours. Coagulation Profile: Recent Labs  Lab 09/30/18 1928  INR 1.0   Cardiac Enzymes: Recent Labs  Lab 10/01/18 0643 10/01/18 1255 10/01/18 1902 10/02/18 0035 10/02/18 0720  TROPONINI 0.18* 0.96* 3.00* 2.85* 2.70*   BNP (last 3 results) No results for input(s): PROBNP in the last 8760 hours. HbA1C: No results for input(s): HGBA1C in the last 72 hours. CBG: Recent Labs  Lab 10/06/18 1108 10/06/18 1556 10/06/18 2001 10/07/18 0024 10/07/18 0334  GLUCAP 176* 204* 265* 209* 174*     Lipid Profile: No results for input(s): CHOL, HDL, LDLCALC, TRIG, CHOLHDL, LDLDIRECT in the last 72 hours. Thyroid Function Tests: No results for input(s): TSH, T4TOTAL, FREET4, T3FREE, THYROIDAB in the last 72 hours. Anemia Panel: No results for input(s): VITAMINB12, FOLATE, FERRITIN, TIBC, IRON, RETICCTPCT in the last 72 hours. Urine analysis:    Component Value Date/Time   COLORURINE STRAW (A) 10/01/2018 2340   APPEARANCEUR CLEAR 10/01/2018 2340   LABSPEC 1.012 10/01/2018 2340   PHURINE 5.0 10/01/2018 2340   GLUCOSEU NEGATIVE 10/01/2018 2340   HGBUR NEGATIVE 10/01/2018 2340   BILIRUBINUR NEGATIVE 10/01/2018 2340   BILIRUBINUR Neg 05/30/2017 1329   KETONESUR NEGATIVE 10/01/2018 2340   PROTEINUR NEGATIVE 10/01/2018 2340   UROBILINOGEN 0.2 05/30/2017 1329   UROBILINOGEN 0.2 12/27/2010 0505   NITRITE NEGATIVE 10/01/2018 2340   LEUKOCYTESUR NEGATIVE 10/01/2018 2340   Sepsis Labs: @LABRCNTIP (procalcitonin:4,lacticidven:4)  ) Recent Results (from the past 240 hour(s))  Blood culture (routine x 2)     Status: None   Collection Time: 09/30/18 11:45 PM  Result Value Ref Range Status   Specimen Description BLOOD RIGHT ARM  Final   Special Requests   Final    BOTTLES DRAWN AEROBIC AND ANAEROBIC Blood Culture adequate volume   Culture   Final    NO GROWTH 5 DAYS Performed at [facility], [place] [address] [place], [place] [address]    Report Status 10/06/2018 FINAL  Final  Blood culture (routine x 2)     Status: None   Collection Time: 09/30/18 11:55 PM  Result Value Ref Range Status   Specimen Description BLOOD LEFT WRIST  Final   Special Requests   Final    BOTTLES DRAWN AEROBIC AND ANAEROBIC Blood Culture adequate volume   Culture   Final    NO GROWTH 5 DAYS Performed at [facility], [place] [address], [place], [place] [address]    Report Status 10/06/2018 FINAL  Final      Radiology Studies: No results found.   Scheduled Meds: . ALPRAZolam  1 mg Oral  QHS  . amLODipine  2.5 mg Oral Daily  . aspirin EC  81 mg Oral Daily  . atorvastatin  10 mg Oral q1800  . gabapentin  200 mg Oral QHS  . insulin aspart  0-20 Units Subcutaneous TID WC  . insulin aspart  0-5 Units Subcutaneous QHS  . insulin aspart  2 Units Subcutaneous TID WC  . insulin glargine  36 Units Subcutaneous QHS  . isosorbide mononitrate  30 mg Oral Daily  . lactulose  20 g Oral Once  . levothyroxine  125 mcg Oral Q0600  .  metoprolol tartrate  12.5 mg Oral BID  . pantoprazole  40 mg Oral Daily  . polyethylene glycol  17 g Oral Daily  . saccharomyces boulardii  250 mg Oral BID   Continuous Infusions:    LOS: 6 days   Time Spent in minutes   30 minutes  Romina Divirgilio D.O. on 10/07/2018 at 10:50 AM  Between 7am to 7pm - Please see pager noted on [email or web address]  After 7pm go to [email or web address]  And look for the night coverage person covering for me after hours  [facility] Office  [phone]

## 2018-10-08 DIAGNOSIS — H3530 Unspecified macular degeneration: Secondary | ICD-10-CM | POA: Diagnosis not present

## 2018-10-08 DIAGNOSIS — I959 Hypotension, unspecified: Secondary | ICD-10-CM | POA: Diagnosis not present

## 2018-10-08 DIAGNOSIS — N184 Chronic kidney disease, stage 4 (severe): Secondary | ICD-10-CM | POA: Diagnosis not present

## 2018-10-08 DIAGNOSIS — M179 Osteoarthritis of knee, unspecified: Secondary | ICD-10-CM | POA: Diagnosis not present

## 2018-10-08 DIAGNOSIS — E039 Hypothyroidism, unspecified: Secondary | ICD-10-CM | POA: Diagnosis not present

## 2018-10-08 DIAGNOSIS — I25119 Atherosclerotic heart disease of native coronary artery with unspecified angina pectoris: Secondary | ICD-10-CM | POA: Diagnosis not present

## 2018-10-08 DIAGNOSIS — I130 Hypertensive heart and chronic kidney disease with heart failure and stage 1 through stage 4 chronic kidney disease, or unspecified chronic kidney disease: Secondary | ICD-10-CM | POA: Diagnosis not present

## 2018-10-08 DIAGNOSIS — I69818 Other symptoms and signs involving cognitive functions following other cerebrovascular disease: Secondary | ICD-10-CM | POA: Diagnosis not present

## 2018-10-08 DIAGNOSIS — I259 Chronic ischemic heart disease, unspecified: Secondary | ICD-10-CM | POA: Diagnosis not present

## 2018-10-08 DIAGNOSIS — I4891 Unspecified atrial fibrillation: Secondary | ICD-10-CM | POA: Diagnosis not present

## 2018-10-08 DIAGNOSIS — H3500 Unspecified background retinopathy: Secondary | ICD-10-CM | POA: Diagnosis not present

## 2018-10-08 DIAGNOSIS — I213 ST elevation (STEMI) myocardial infarction of unspecified site: Secondary | ICD-10-CM | POA: Diagnosis not present

## 2018-10-08 DIAGNOSIS — I70209 Unspecified atherosclerosis of native arteries of extremities, unspecified extremity: Secondary | ICD-10-CM | POA: Diagnosis not present

## 2018-10-08 DIAGNOSIS — Z794 Long term (current) use of insulin: Secondary | ICD-10-CM | POA: Diagnosis not present

## 2018-10-08 DIAGNOSIS — L89302 Pressure ulcer of unspecified buttock, stage 2: Secondary | ICD-10-CM | POA: Diagnosis not present

## 2018-10-08 DIAGNOSIS — K222 Esophageal obstruction: Secondary | ICD-10-CM | POA: Diagnosis not present

## 2018-10-08 DIAGNOSIS — K219 Gastro-esophageal reflux disease without esophagitis: Secondary | ICD-10-CM | POA: Diagnosis not present

## 2018-10-08 DIAGNOSIS — Z7189 Other specified counseling: Secondary | ICD-10-CM

## 2018-10-08 DIAGNOSIS — Z87440 Personal history of urinary (tract) infections: Secondary | ICD-10-CM | POA: Diagnosis not present

## 2018-10-08 DIAGNOSIS — E639 Nutritional deficiency, unspecified: Secondary | ICD-10-CM | POA: Diagnosis not present

## 2018-10-08 DIAGNOSIS — M2550 Pain in unspecified joint: Secondary | ICD-10-CM | POA: Diagnosis not present

## 2018-10-08 DIAGNOSIS — E559 Vitamin D deficiency, unspecified: Secondary | ICD-10-CM | POA: Diagnosis not present

## 2018-10-08 DIAGNOSIS — E1122 Type 2 diabetes mellitus with diabetic chronic kidney disease: Secondary | ICD-10-CM | POA: Diagnosis not present

## 2018-10-08 DIAGNOSIS — E1129 Type 2 diabetes mellitus with other diabetic kidney complication: Secondary | ICD-10-CM | POA: Diagnosis not present

## 2018-10-08 DIAGNOSIS — I2111 ST elevation (STEMI) myocardial infarction involving right coronary artery: Secondary | ICD-10-CM | POA: Diagnosis not present

## 2018-10-08 DIAGNOSIS — R300 Dysuria: Secondary | ICD-10-CM | POA: Diagnosis not present

## 2018-10-08 DIAGNOSIS — I509 Heart failure, unspecified: Secondary | ICD-10-CM | POA: Diagnosis not present

## 2018-10-08 DIAGNOSIS — E119 Type 2 diabetes mellitus without complications: Secondary | ICD-10-CM | POA: Diagnosis not present

## 2018-10-08 DIAGNOSIS — I160 Hypertensive urgency: Secondary | ICD-10-CM | POA: Diagnosis not present

## 2018-10-08 DIAGNOSIS — E1151 Type 2 diabetes mellitus with diabetic peripheral angiopathy without gangrene: Secondary | ICD-10-CM | POA: Diagnosis not present

## 2018-10-08 DIAGNOSIS — R279 Unspecified lack of coordination: Secondary | ICD-10-CM | POA: Diagnosis not present

## 2018-10-08 DIAGNOSIS — R10819 Abdominal tenderness, unspecified site: Secondary | ICD-10-CM | POA: Diagnosis not present

## 2018-10-08 DIAGNOSIS — R079 Chest pain, unspecified: Secondary | ICD-10-CM | POA: Diagnosis not present

## 2018-10-08 DIAGNOSIS — E785 Hyperlipidemia, unspecified: Secondary | ICD-10-CM | POA: Diagnosis not present

## 2018-10-08 DIAGNOSIS — E113293 Type 2 diabetes mellitus with mild nonproliferative diabetic retinopathy without macular edema, bilateral: Secondary | ICD-10-CM | POA: Diagnosis not present

## 2018-10-08 DIAGNOSIS — I214 Non-ST elevation (NSTEMI) myocardial infarction: Secondary | ICD-10-CM | POA: Diagnosis not present

## 2018-10-08 DIAGNOSIS — Z515 Encounter for palliative care: Secondary | ICD-10-CM | POA: Diagnosis not present

## 2018-10-08 DIAGNOSIS — H04129 Dry eye syndrome of unspecified lacrimal gland: Secondary | ICD-10-CM | POA: Diagnosis not present

## 2018-10-08 DIAGNOSIS — D649 Anemia, unspecified: Secondary | ICD-10-CM | POA: Diagnosis not present

## 2018-10-08 DIAGNOSIS — R918 Other nonspecific abnormal finding of lung field: Secondary | ICD-10-CM | POA: Diagnosis not present

## 2018-10-08 DIAGNOSIS — D72829 Elevated white blood cell count, unspecified: Secondary | ICD-10-CM | POA: Diagnosis not present

## 2018-10-08 DIAGNOSIS — G894 Chronic pain syndrome: Secondary | ICD-10-CM | POA: Diagnosis not present

## 2018-10-08 DIAGNOSIS — R2689 Other abnormalities of gait and mobility: Secondary | ICD-10-CM | POA: Diagnosis not present

## 2018-10-08 DIAGNOSIS — F419 Anxiety disorder, unspecified: Secondary | ICD-10-CM | POA: Diagnosis not present

## 2018-10-08 DIAGNOSIS — Z7401 Bed confinement status: Secondary | ICD-10-CM | POA: Diagnosis not present

## 2018-10-08 DIAGNOSIS — D631 Anemia in chronic kidney disease: Secondary | ICD-10-CM | POA: Diagnosis not present

## 2018-10-08 DIAGNOSIS — M6281 Muscle weakness (generalized): Secondary | ICD-10-CM | POA: Diagnosis not present

## 2018-10-08 LAB — BASIC METABOLIC PANEL
Anion gap: 8 (ref 5–15)
BUN: 41 mg/dL — ABNORMAL HIGH (ref 8–23)
CO2: 27 mmol/L (ref 22–32)
Calcium: 8.1 mg/dL — ABNORMAL LOW (ref 8.9–10.3)
Chloride: 99 mmol/L (ref 98–111)
Creatinine, Ser: 1.71 mg/dL — ABNORMAL HIGH (ref 0.44–1.00)
GFR calc non Af Amer: 26 mL/min — ABNORMAL LOW (ref >60)
GFR, EST AFRICAN AMERICAN: 30 mL/min — AB (ref >60)
Glucose, Bld: 142 mg/dL — ABNORMAL HIGH (ref 70–99)
Potassium: 4.6 mmol/L (ref 3.5–5.1)
Sodium: 134 mmol/L — ABNORMAL LOW (ref 135–145)

## 2018-10-08 LAB — HEMOGLOBIN AND HEMATOCRIT, BLOOD
HCT: 29.2 % — ABNORMAL LOW (ref 36.0–46.0)
Hemoglobin: 9.5 g/dL — ABNORMAL LOW (ref 12.0–15.0)

## 2018-10-08 LAB — GLUCOSE, CAPILLARY
GLUCOSE-CAPILLARY: 223 mg/dL — AB (ref 70–99)
Glucose-Capillary: 153 mg/dL — ABNORMAL HIGH (ref 70–99)
Glucose-Capillary: 188 mg/dL — ABNORMAL HIGH (ref 70–99)
Glucose-Capillary: 88 mg/dL (ref 70–99)

## 2018-10-08 NOTE — Care Management Important Message (Signed)
Important Message  Patient Details  Name: Karen [name] MRN: [id number] Date of Birth: 02-15-1924   Medicare Important Message Given:  Yes    Travelle Mcclimans P [place] 10/08/2018, 2:57 PM

## 2018-10-08 NOTE — Social Work (Signed)
Patient will discharge to [facility] Anticipated discharge date: 10/08/2018 Family notified: [name], son Transportation by: [facility]  Nurse to call report to [phone].   CSW signing off.  Estanislado Emms, [place]  Clinical Social Worker

## 2018-10-08 NOTE — Clinical Social Work Placement (Signed)
   CLINICAL SOCIAL WORK PLACEMENT  NOTE  Date:  10/08/2018  Patient Details  Name: Karen [name] MRN: [id number] Date of Birth: Oct 01, 1923  Clinical Social Work is seeking post-discharge placement for this patient at the [place] level of care (*CSW will initial, date and re-position this form in  chart as items are completed):  Yes   Patient/family provided with [place] [facility] Work Department's list of facilities offering this level of care within the geographic area requested by the patient (or if unable, by the patient's family).  Yes   Patient/family informed of their freedom to choose among providers that offer the needed level of care, that participate in Medicare, Medicaid or managed care program needed by the patient, have an available bed and are willing to accept the patient.  Yes   Patient/family informed of [facility]'s ownership interest in [facility] and [facility], as well as of the fact that they are under no obligation to receive care at these facilities.  PASRR submitted to EDS on       PASRR number received on       Existing PASRR number confirmed on 10/04/18     FL2 transmitted to all facilities in geographic area requested by pt/family on 10/04/18     FL2 transmitted to all facilities within larger geographic area on       Patient informed that his/her managed care company has contracts with or will negotiate with certain facilities, including the following:  [facility]     Yes   Patient/family informed of bed offers received.  Patient chooses bed at [facility]     Physician recommends and patient chooses bed at      Patient to be transferred to [facility] on 10/08/18.  Patient to be transferred to facility by [facility]     Patient family notified on 10/08/18 of transfer.  Name of family member notified:  [name], son     PHYSICIAN       Additional Comment:     _______________________________________________ Estanislado Emms, LCSW 10/08/2018, 10:24 AM

## 2018-10-08 NOTE — Telephone Encounter (Signed)
Last office visit 08/03/2018 for Hospital follow up.  Last refilled 09/13/2018 jfor #120 by Dr. Diona Browner and 10/05/2018 for #10 by Cristal Ford, DO with different directions.  Next Appt: 11/02/2018 for 3 month follow up with Dr. Diona Browner.

## 2018-10-08 NOTE — Discharge Instructions (Signed)
Heart Attack Your heart is a muscle and needs oxygen to survive. A heart attack (myocardial infarction, MI) is a condition that occurs when your heart does not get enough oxygen, and the heart muscle begins to die (ischemia). This can cause permanent damage if not treated right away. A heart attack is a medical emergency. Heart attack is also known as acute coronary syndrome (ACS). ACS is a term used to describe a group of conditions that affect blood flow to the heart. What are the causes? This condition can be caused by:  Atherosclerosis. This is when a fatty substance (plaque) gradually builds up in the blood vessels that flow to the heart (coronaryarteries). This buildup can block or reduce blood supply to one or more of the coronary arteries.  A blood clot. A blood clot can develop suddenly when plaque breaks up (ruptures) within a coronary artery, or it can travel to the heart from another area of the body. The blood clot blocks the artery and prevents blood flow to the heart.  Low blood pressure (hypotension).  An abnormal heart beat (arrhythmia).  Medical conditions that cause a decrease of oxygen to the heart, such as anemiaorrespiratory failure (oxygen mismatch).  Severe tightening (spasm) of a blood vessel that cuts off blood flow to the heart.  Tearing of a coronary artery (spontaneous coronary artery dissection).  Certain heart procedures. What increases the risk? The following factors may make you more likely to develop this condition:  Age. The older a person is, the higher the risk.  Personal or family history of chest pain, heart attack, peripheral artery disease, or stroke.  Being female.  Smoking.  Lack of exercise.  Overweight or obesity.  High blood pressure (hypertension).  High cholesterol.  Diabetes.  Drinking too much alcohol.  Using drugs, such as cocaine or methamphetamine. What are the signs or symptoms? Symptoms of this condition may vary,  depending on factors like gender and age. Symptoms may include:  Chest pain. It may feel like: ? Crushing or squeezing. ? Tightness, pressure, fullness, or heaviness.  Pain in the arm, neck, jaw, back, or upper body.  Shortness of breath.  Heartburn or indigestion.  Nausea.  Sudden cold sweats.  Feeling tired.  Sudden lightheadedness. How is this diagnosed? This condition may be diagnosed through tests, such as:  Electrocardiogram (ECG) to measure the electrical activity of your heart.  Blood tests to check for chemicals released by damaged heart muscle (cardiac markers).  Coronary angiogram to evaluate blood flow and heart function.  CT scan to see the heart more clearly.  Echocardiogram to evaluate heart motion and blood flow. How is this treated? A heart attack must be treated as soon as possible. Treatment may include:  Medicines to: ? Break up or dissolve blood clots (fibrinolytic therapy). ? Thin blood and to help prevent blood clots. ? Treat blood pressure. ? Improve blood flow to the heart. ? Reduce pain. ? Reduce cholesterol.  Procedures to widen a blocked artery and keep it open (angioplasty and stent placement).  Open heart surgery (coronary artery bypass graft, CABG). This enables blood to flow to the heart by going around (bypassing) the damaged part of the artery.  Oxygen therapy if needed.  Cardiac rehabilitation. This improves your health and well-being through exercise training, education, and counseling. Follow these instructions at home: Medicines  Take over-the-counter and prescription medicines only as told by your health care provider.  Maxtyn Nuzum not take the following medicines unless your health care provider   says it is okay to take them: ? NSAIDs. ? Supplements that contain vitamin A, vitamin E, or both. ? Hormone replacement therapy that contains estrogen with or without progestin. Lifestyle  Britta Louth not use any products that contain nicotine  or tobacco, such as cigarettes and e-cigarettes. If you need help quitting, ask your health care provider.  Avoid secondhand smoke.  Exercise regularly. Ask your health care provider about participating in a cardiac rehabilitation program that helps you start exercising safely after a heart attack.  Eat a heart-healthy diet. Your health care provider will tell you what foods to eat.  Maintain a healthy weight.  Learn ways to manage stress.  Adryan Shin not use illegal drugs. Alcohol use  Heston Widener not drink alcohol if: ? Your health care provider tells you not to drink. ? You are pregnant, may be pregnant, or are planning to become pregnant.  If you drink alcohol, limit how much you have: ? 0-1 drink a day for women. ? 0-2 drinks a day for men.  Be aware of how much alcohol is in your drink. In the [place] one drink equals one typical bottle of beer (12 oz), one-half glass of wine (5 oz), or one shot of hard liquor (1 oz). General instructions  Work with your health care provider to manage any other conditions you have, such as hypertension or diabetes. These conditions affect your heart.  Get screened for depression, and seek treatment if needed.  Keep your vaccinations up to date. Get the flu (influenza) vaccine every year.  Keep all follow-up visits as told by your health care provider. This is important. Contact a health care provider if:  You feel overwhelmed or sad.  You have trouble doing your daily activities. Get help right away if you:  Have sudden, unexplained discomfort in your chest, arms, back, neck, jaw, or upper body.  Have shortness of breath.  Suddenly start to sweat or your skin gets clammy.  Feel nauseous or vomit.  Have unexplained fatigue or weakness.  Suddenly feel lightheaded or dizzy.  Notice your heart starts to beat fast or feels like it is skipping beats.  Have blood pressure that is higher than 180/120. These symptoms may represent a serious problem  that is an emergency. Dalante Minus not wait to see if the symptoms will go away. Get medical help right away. Call your local emergency services (911 in the [place] Judyth Demarais not drive yourself to the hospital. Summary  A heart attack (myocardial infarction, MI) is a condition that occurs when an artery in the heart (coronary artery) becomes narrowed or blocked. The narrowing or blockage cuts off the blood and oxygen supply to the heart, which can permanently damage the heart.  A heart attack is an emergency. Get help right away if you have sudden pain in your chest, arms, back, jaw, or upper body. Seek help if you have nausea or you vomit, or you become lightheaded or dizzy.  Treatment is a combination of medicines and procedures, if needed, to open the blocked artery and restore blood flow to the heart. This information is not intended to replace advice given to you by your health care provider. Make sure you discuss any questions you have with your health care provider. Document Released: 07/18/2005 Document Revised: 09/01/2017 Document Reviewed: 09/01/2017 Elsevier Interactive Patient Education  2019 Elsevier Inc.  

## 2018-10-08 NOTE — Progress Notes (Signed)
RN called facility to give report, unable to get connected to facility RN who will take care of pt. Will call back

## 2018-10-09 ENCOUNTER — Other Ambulatory Visit: Payer: Self-pay | Admitting: *Deleted

## 2018-10-09 DIAGNOSIS — I213 ST elevation (STEMI) myocardial infarction of unspecified site: Secondary | ICD-10-CM | POA: Diagnosis not present

## 2018-10-09 DIAGNOSIS — I160 Hypertensive urgency: Secondary | ICD-10-CM | POA: Diagnosis not present

## 2018-10-09 DIAGNOSIS — R918 Other nonspecific abnormal finding of lung field: Secondary | ICD-10-CM | POA: Diagnosis not present

## 2018-10-09 DIAGNOSIS — E119 Type 2 diabetes mellitus without complications: Secondary | ICD-10-CM | POA: Diagnosis not present

## 2018-10-09 NOTE — Patient Outreach (Addendum)
[facility]) Care Management [place] Telephone Outreach Case Closure  10/09/2018  [name] Aug 22, 1923 [id number]  [place] CM Case Closure note re:  Analy Bassford, 85 y/o female referred to [facility] by ED RN CM after recent ED visit 06/26/18 for generalized weakness, dehydration, and fall. Patient was discharged from ED to SNF for rehabilitation.Patient has history including, but not limited to, HTN/ HLD; CKD- stage III; OSA; GERD; recent falls; DM- type II with neuropathy; pA-Fib; previous TIA/ CVA; and anxiety.Unfortunately, patient experienced hospital readmission March 1-9, 2020 for chest pain.  Received notification from [facility] that patient has discharged from hospital to SNF with notification to close [facility] [place] patient case. Review of EMR indicates that patient is now active with palliative care program post-hospital discharge.  Additionally, received off-hours voice mail from patient's son/ caregiver [name], whom patient provided previous verbal consent to speak with, notifying of same.    12:45 pm: contacted patient's son Karen [name], HIPAA/ identity verified; confirmed that patient is now at [facility] post-hospital discharge.  Patient's caregiver/ son declined further care coordination needs and further confirms that patient is now active with palliative care; discussed with caregiver [facility] RN [facility]mi[facility] CM case closure and caregiver is agreeable with same.  Plan:  Will close [facility] patient case as recommended and make patient's PCP aware of same  Oneta Rack, RN, BSN, [facility] Care Management  [phone]

## 2018-10-12 DIAGNOSIS — N184 Chronic kidney disease, stage 4 (severe): Secondary | ICD-10-CM | POA: Diagnosis not present

## 2018-10-12 DIAGNOSIS — I213 ST elevation (STEMI) myocardial infarction of unspecified site: Secondary | ICD-10-CM | POA: Diagnosis not present

## 2018-10-12 DIAGNOSIS — E1122 Type 2 diabetes mellitus with diabetic chronic kidney disease: Secondary | ICD-10-CM | POA: Diagnosis not present

## 2018-10-12 DIAGNOSIS — Z794 Long term (current) use of insulin: Secondary | ICD-10-CM | POA: Diagnosis not present

## 2018-10-15 ENCOUNTER — Ambulatory Visit: Payer: Self-pay | Admitting: *Deleted

## 2018-10-16 ENCOUNTER — Other Ambulatory Visit: Payer: Self-pay

## 2018-10-16 ENCOUNTER — Non-Acute Institutional Stay: Payer: Medicare Other | Admitting: Student

## 2018-10-16 VITALS — BP 110/60 | HR 68 | Wt 136.3 lb

## 2018-10-16 DIAGNOSIS — Z515 Encounter for palliative care: Secondary | ICD-10-CM | POA: Diagnosis not present

## 2018-10-16 NOTE — Progress Notes (Signed)
[facility] Palliative Care Consult Note Telephone: [phone]  Fax: [phone]  PATIENT NAME: Karen [name] DOB: 06-15-1924 MRN: [id number]  PRIMARY CARE PROVIDER:   Jinny Sanders, MD  REFERRING PROVIDER:  Dr. Virgel Bouquet  RESPONSIBLE PARTY: Son, Karen [name]  ASSESSMENT: Karen [name] is sitting up to wheel chair upon arrival. She is alert and oriented x 3. No acute distress noted. NP explained role of Palliative Medicine; she consents to visits. We discussed goals of care; she would like to continue occupational and physical therapy. She states she plans go to live with her son Karen [name] upon discharge from facility. She states she is open to Palliative Medicine checking on her in the home. Discussed symptom management. Code status not discussed this visit as therapy arrived. She is listed as a DNR per facility records. Discussed with staff nurse Tillie Rung. Message left for son Karen [name] to discuss further.        RECOMMENDATIONS and PLAN:  1. Code status: DNR per facility chart. 2. Medical goals of therapy: Karen [name] will continue occupational and physical therapy as ordered. Palliative Medicine will monitor for needs and make recommendations as needed.  3. Symptom management: Edema-continue furosemide 40mg  daily; elevate legs when sitting in chair or bed. Pain- continue gabapentin 200mg  qhs, voltaren gel 1% QID to bilateral knees. 4. Discharge Planning: No definite discharge plans at this time.  5. Emotional support: Discussed with Karen [name], staff nurse Tillie Rung; message left for son Karen [name].   Palliative Medicine will follow up in 4 weeks or sooner, if needed.   I spent 25 minutes providing this consultation,  from 10:44am to 11:10am. More than 50% of the time in this consultation was spent coordinating communication.   HISTORY OF PRESENT ILLNESS:  MAIYAH GOYNE is a 85 y.o. female with multiple medical problems including  diabetes type 2, hypertension, GERD, hypothyroidism, chronic kidney disease-stage 4, constipation. She was recently hospitalized 3/1-10/08/2018 due to STEMI, hypertensive urgency, dysuria, hyperkalemia, ? MAI infection. Palliative Care was asked to help address goals of care. Karen [name] is currently at [facility]. She is receiving occupational and physical therapy. She reports feeling much better since hospitalization. She requires assistance with adl's. She states she has been doing her own grooming and dressing herself. She is presently sitting in wheel chair. She states that she was living at home prior to hospitalization. She denies pain at present; denies any chest pain. She does report chronic bilateral knee pain, left worse than right. She denies dyspnea, nausea, constipation or diarrhea. She reports a good appetite. She denies any hypo or hyperglycemic episodes. She has edema to lower extremities. She reports a wound to buttocks; xeroform treatment being applied by facility wound care nurse. Discussed with wound care nurse Karen [name]. She is sleeping well at night. She denies any other concerns at present.  CODE STATUS: DNR.  PPS: 40% HOSPICE ELIGIBILITY/DIAGNOSIS: TBD  PAST MEDICAL HISTORY:  Past Medical History:  Diagnosis Date  . A-fib ([place])   . Abnormal involuntary movements(781.0)   . Anemia   . Anxiety   . Anxiety state, unspecified   . Arthritis    body, neck  . Benign hypertensive heart disease   . Benign hypertensive heart disease with congestive heart failure ([place])   . Bladder disorder    over active  . Cancer ([place])    skin, removed  . Cervicalgia   . CHF (congestive heart failure) ([place])   .  Chronic kidney disease, stage III (moderate) (HCC)   . Congestive heart failure, unspecified   . Contact dermatitis and other eczema, due to unspecified cause   . Corns and callosities   . Diabetes mellitus without complication ([place])   . Diarrhea   . Dyspnea  02/18/2015  . Edema   . Esophageal stricture   . Gastroparesis   . Gastroparesis   . GERD (gastroesophageal reflux disease)   . Hiatal hernia   . Hyperlipidemia   . Inflamed seborrheic keratosis   . Inflammatory disease of breast   . Irregular heartbeat   . Lumbago   . Mixed incontinence urge and stress (female)(female)   . Nausea alone   . Nonspecific (abnormal) findings on radiological and other examination of skull and head   . Osteoarthrosis, unspecified whether generalized or localized, unspecified site   . Other and unspecified hyperlipidemia   . Other B-complex deficiencies   . Other functional disorder of bladder   . Other malaise and fatigue   . Other specified visual disturbances   . Pain in joint, lower leg   . Palpitations   . Peripheral vascular disease, unspecified ([place])   . Postmenopausal atrophic vaginitis   . Reflux esophagitis   . Seborrheic keratosis   . Sleep apnea   . Spasm of muscle   . Spinal stenosis   . Spinal stenosis, unspecified region other than cervical   . Stricture and stenosis of esophagus   . Stroke ([place])   . TIA (transient ischemic attack) 2012  . Transient ischemic attack (TIA), and cerebral infarction without residual deficits(V12.54)   . Type II or unspecified type diabetes mellitus with renal manifestations, not stated as uncontrolled(250.40)   . Unspecified constipation   . Unspecified essential hypertension   . Unspecified hereditary and idiopathic peripheral neuropathy   . Unspecified hypothyroidism   . Unspecified pruritic disorder   . Unspecified sleep apnea   . Unspecified vitamin D deficiency   . Urinary tract infection, site not specified   . Vitamin B12 deficiency     SOCIAL HX:  Social History   Tobacco Use  . Smoking status: Never Smoker  . Smokeless tobacco: Never Used  Substance Use Topics  . Alcohol use: No    ALLERGIES:  Allergies  Allergen Reactions  . Celebrex [Celecoxib] Rash  . Cymbalta [Duloxetine Hcl]  Rash    Lip numbness,rash, leg and body jerks. Patient states " I though I was having a heart attack or stroke."   . Doxycycline Rash  . Lyrica [Pregabalin] Rash  . Avandia [Rosiglitazone] Rash  . Macrodantin [Nitrofurantoin Macrocrystal] Rash  . Silicone Rash    Gets rash from the touch it.  . Sulfa Antibiotics Rash  . Vioxx [Rofecoxib] Rash     PERTINENT MEDICATIONS:  Outpatient Encounter Medications as of 10/16/2018  Medication Sig  . ALPRAZolam (XANAX) 0.5 MG tablet TAKE 1 TABLET (0.5 MG TOTAL) BY MOUTH 3 (THREE) TIMES DAILY AS NEEDED.  Marland Kitchen amLODipine (NORVASC) 2.5 MG tablet Take 1 tablet (2.5 mg total) by mouth daily.  Marland Kitchen aspirin EC 81 MG EC tablet Take 1 tablet (81 mg total) by mouth daily.  Marland Kitchen atorvastatin (LIPITOR) 10 MG tablet Take 1 tablet (10 mg total) by mouth daily at 6 PM.  . diclofenac sodium (VOLTAREN) 1 % GEL Apply 4 g topically 4 (four) times daily. To bilateral knees  . furosemide (LASIX) 40 MG tablet TAKE 1 TABLET BY MOUTH DAILY TO CONTROL EDEMA (Patient taking differently: Take 40  mg by mouth daily. TO CONTROL EDEMA)  . gabapentin (NEURONTIN) 100 MG capsule TAKE TWO CAPSULES BY MOUTH AT BEDTIME TO HELP WITH TREMORS (Patient taking differently: Take 200 mg by mouth at bedtime. TO HELP WITH TREMORS)  . insulin lispro (HUMALOG) 100 UNIT/ML KwikPen Inject under skin up to 50 units a day as advised. Dx:E11.29, Normal (Patient taking differently: Inject 10 Units into the skin See admin instructions. Inject 10 units before meals.)  . isosorbide mononitrate (IMDUR) 30 MG 24 hr tablet Take 1 tablet (30 mg total) by mouth daily.  Marland Kitchen LANTUS SOLOSTAR 100 UNIT/ML Solostar Pen INJECT 42 UNITS INTO THE SKIN AT BEDTIME (Patient taking differently: Inject 42 Units into the skin at bedtime. )  . levothyroxine (SYNTHROID, LEVOTHROID) 125 MCG tablet TAKE ONE TABLET BY MOUTH EVERY MORNING 30 MINUTES BEFORE BREAKFAST (Patient taking differently: Take 125 mcg by mouth every morning. )  . metoprolol  tartrate (LOPRESSOR) 25 MG tablet Take 0.5 tablets (12.5 mg total) by mouth 2 (two) times daily.  . Vitamin D, Ergocalciferol, (DRISDOL) 50000 units CAPS capsule TAKE ONE CAPSULE BY MOUTH ON THE SAME DAY EVERY WEEK AS DIRECTED (Patient taking differently: Take 50,000 Units by mouth every Monday. )  . CRANBERRY PO Take 300 mg by mouth daily.  . [facility] NATURAL LUTEIN EYE HEALTH PO Take 1 tablet by mouth daily.   Marland Kitchen glucagon (GLUCAGON EMERGENCY) 1 MG injection Inject 1 mg into the muscle once as needed for up to 1 dose.  Marland Kitchen HYDROcodone-acetaminophen (NORCO) 7.5-325 MG tablet Take 1 tablet by mouth every 6 (six) hours as needed for moderate pain.  . Insulin Pen Needle (B-D ULTRAFINE III SHORT PEN) 31G X 8 MM MISC Use with administering insulin. Dx. E11.29  . Morphine Sulfate (MORPHINE CONCENTRATE) 10 MG/0.5ML SOLN concentrated solution Place 0.5 mLs (10 mg total) under the tongue every 4 (four) hours as needed for severe pain (Chest pain or anginal equivalent).  . Multiple Vitamins-Minerals (PRESERVISION AREDS 2 PO) Take 1 tablet by mouth 2 (two) times daily.   . nitroGLYCERIN (NITROSTAT) 0.4 MG SL tablet Place 1 tablet (0.4 mg total) under the tongue every 5 (five) minutes as needed for chest pain.  Marland Kitchen omeprazole (PRILOSEC) 40 MG capsule TAKE ONE (1) CAPSULE BY MOUTH 2 TIMES DAILY FOR STOMACH (Patient not taking: FOR STOMACH)  . ondansetron (ZOFRAN) 4 MG tablet TAKE ONE TABLET BY MOUTH EVERY 4 HOURS AS NEEDED FOR NAUSEA/VOMITING (Patient not taking: No sig reported)  . ONE TOUCH ULTRA TEST test strip Use to test sugar three times daily dx E11.29  . Polyethyl Glycol-Propyl Glycol (SYSTANE OP) Place 1 drop into both eyes 2 (two) times daily.   Marland Kitchen triamcinolone cream (KENALOG) 0.1 % Apply 1 application topically as needed. Mix with CeraVe cream apply twice daily for dry skin  . trimethoprim (TRIMPEX) 100 MG tablet TAKE ONE TABLET BY MOUTH EVERY NIGHT AT BEDTIME TO PREVENT BLADDER INFECTION. (Patient not taking: No  sig reported)  . VITAMIN E PO Take 400 Units by mouth daily.   No facility-administered encounter medications on file as of 10/16/2018.     PHYSICAL EXAM:   General: NAD, frail appearing Cardiovascular: regular rate and rhythm Pulmonary: clear ant fields Abdomen: soft, nontender, + bowel sounds GU: no suprapubic tenderness Extremities: 2+ edema, no joint deformities Skin: no rashes Neurological: Weakness but otherwise nonfocal  Ezekiel Slocumb, NP

## 2018-10-19 DIAGNOSIS — D649 Anemia, unspecified: Secondary | ICD-10-CM | POA: Diagnosis not present

## 2018-10-19 DIAGNOSIS — E1122 Type 2 diabetes mellitus with diabetic chronic kidney disease: Secondary | ICD-10-CM | POA: Diagnosis not present

## 2018-10-19 DIAGNOSIS — N184 Chronic kidney disease, stage 4 (severe): Secondary | ICD-10-CM | POA: Diagnosis not present

## 2018-10-19 DIAGNOSIS — D72829 Elevated white blood cell count, unspecified: Secondary | ICD-10-CM | POA: Diagnosis not present

## 2018-10-25 DIAGNOSIS — I509 Heart failure, unspecified: Secondary | ICD-10-CM | POA: Diagnosis not present

## 2018-10-25 DIAGNOSIS — I213 ST elevation (STEMI) myocardial infarction of unspecified site: Secondary | ICD-10-CM | POA: Diagnosis not present

## 2018-10-25 DIAGNOSIS — E1122 Type 2 diabetes mellitus with diabetic chronic kidney disease: Secondary | ICD-10-CM | POA: Diagnosis not present

## 2018-10-25 DIAGNOSIS — D631 Anemia in chronic kidney disease: Secondary | ICD-10-CM | POA: Diagnosis not present

## 2018-10-25 DIAGNOSIS — I130 Hypertensive heart and chronic kidney disease with heart failure and stage 1 through stage 4 chronic kidney disease, or unspecified chronic kidney disease: Secondary | ICD-10-CM | POA: Diagnosis not present

## 2018-10-25 DIAGNOSIS — N184 Chronic kidney disease, stage 4 (severe): Secondary | ICD-10-CM | POA: Diagnosis not present

## 2018-10-26 DIAGNOSIS — E1122 Type 2 diabetes mellitus with diabetic chronic kidney disease: Secondary | ICD-10-CM | POA: Diagnosis not present

## 2018-10-26 DIAGNOSIS — I509 Heart failure, unspecified: Secondary | ICD-10-CM | POA: Diagnosis not present

## 2018-10-26 DIAGNOSIS — I213 ST elevation (STEMI) myocardial infarction of unspecified site: Secondary | ICD-10-CM | POA: Diagnosis not present

## 2018-10-26 DIAGNOSIS — N184 Chronic kidney disease, stage 4 (severe): Secondary | ICD-10-CM | POA: Diagnosis not present

## 2018-10-26 DIAGNOSIS — D631 Anemia in chronic kidney disease: Secondary | ICD-10-CM | POA: Diagnosis not present

## 2018-10-26 DIAGNOSIS — I130 Hypertensive heart and chronic kidney disease with heart failure and stage 1 through stage 4 chronic kidney disease, or unspecified chronic kidney disease: Secondary | ICD-10-CM | POA: Diagnosis not present

## 2018-10-29 ENCOUNTER — Telehealth: Payer: Self-pay | Admitting: *Deleted

## 2018-10-29 DIAGNOSIS — D631 Anemia in chronic kidney disease: Secondary | ICD-10-CM | POA: Diagnosis not present

## 2018-10-29 DIAGNOSIS — I130 Hypertensive heart and chronic kidney disease with heart failure and stage 1 through stage 4 chronic kidney disease, or unspecified chronic kidney disease: Secondary | ICD-10-CM | POA: Diagnosis not present

## 2018-10-29 DIAGNOSIS — N184 Chronic kidney disease, stage 4 (severe): Secondary | ICD-10-CM | POA: Diagnosis not present

## 2018-10-29 DIAGNOSIS — I509 Heart failure, unspecified: Secondary | ICD-10-CM | POA: Diagnosis not present

## 2018-10-29 DIAGNOSIS — I213 ST elevation (STEMI) myocardial infarction of unspecified site: Secondary | ICD-10-CM | POA: Diagnosis not present

## 2018-10-29 DIAGNOSIS — E1122 Type 2 diabetes mellitus with diabetic chronic kidney disease: Secondary | ICD-10-CM | POA: Diagnosis not present

## 2018-10-29 MED ORDER — INSULIN NPH (HUMAN) (ISOPHANE) 100 UNIT/ML ~~LOC~~ SUSP: 14.0000 [IU] | Freq: Two times a day (BID) | SUBCUTANEOUS | 5 refills | Status: DC

## 2018-10-29 MED ORDER — INSULIN REGULAR HUMAN 100 UNIT/ML IJ SOLN: INTRAMUSCULAR | 5 refills | Status: DC

## 2018-10-29 NOTE — Telephone Encounter (Signed)
Izora Gala with Hospice called stating that they recently admitted patient under their care. Izora Gala stated that patient is on Humalog and Lantus and that is not on their formulary. Izora Gala wants to know if Dr. Diona Browner is willing to change patient's insulin to what they cover because of cost to the patient? Izora Gala stated that they have Humulin to substitute for the Humalog. Izora Gala stated that she will have to call back with information regarding the substitute for the Lantas. Izora Gala stated that if Dr, Diona Browner is willing to make the changes she will need to send in new scripts and the dosing.Izora Gala stated that she will call back about the Lantus.

## 2018-10-29 NOTE — Telephone Encounter (Signed)
[facility] as [facility] by telephone.  Left message for [facility] with Hospice letting her know Insulin prescriptions have been changed as requested and [facility].

## 2018-10-29 NOTE — Telephone Encounter (Signed)
Izora Gala called back stating that the Humalog Dia Crawford would be Humulin R or Novolin R and would be decreased by 20 %, which would be 8 units in the am and 11 units before lunch and dinner. The Lantus [facility] would be Humulin N and would be two daily doses of 14 units each. If Dr. Diona Browner agrees with this they would need a new script sent in.  [facility]

## 2018-10-29 NOTE — Telephone Encounter (Signed)
Okay to send in new prescriptions as requested.

## 2018-10-30 ENCOUNTER — Telehealth: Payer: Self-pay

## 2018-10-30 NOTE — Telephone Encounter (Signed)
Izora Gala nurse with [facility] doing video visits; pt has been instructed about keeping her feet elevated when sitting but still has swelling and redness(no red streaks) in both lower legs and feet. No CP or SOB.Izora Gala said pt was admitted to [facility] on 10/25/18 and pts daughter said no change in leg swelling since 10/26/18. Izora Gala said pts feet are not fitting in shoes and wants to now if can increase Lasix. Pt presently taking Lasix 40 mg one daily. [facility] [employer] [facility] request cb.

## 2018-10-30 NOTE — Telephone Encounter (Signed)
Spoke with Izora Gala with [facility].  She states they are doing video visits with patient so she does have capablitiy.  Will have Robin call her [name] (son)  at [phone] to try and set up WebEx appt.

## 2018-10-30 NOTE — Telephone Encounter (Signed)
Do pt/caregiver have a way to do webex? If so send to regina to schedule.. if not let me know and I will try to address In a different way.

## 2018-10-31 DIAGNOSIS — N184 Chronic kidney disease, stage 4 (severe): Secondary | ICD-10-CM | POA: Diagnosis not present

## 2018-10-31 DIAGNOSIS — E1122 Type 2 diabetes mellitus with diabetic chronic kidney disease: Secondary | ICD-10-CM | POA: Diagnosis not present

## 2018-10-31 DIAGNOSIS — I130 Hypertensive heart and chronic kidney disease with heart failure and stage 1 through stage 4 chronic kidney disease, or unspecified chronic kidney disease: Secondary | ICD-10-CM | POA: Diagnosis not present

## 2018-10-31 DIAGNOSIS — I213 ST elevation (STEMI) myocardial infarction of unspecified site: Secondary | ICD-10-CM | POA: Diagnosis not present

## 2018-10-31 DIAGNOSIS — D631 Anemia in chronic kidney disease: Secondary | ICD-10-CM | POA: Diagnosis not present

## 2018-10-31 DIAGNOSIS — I509 Heart failure, unspecified: Secondary | ICD-10-CM | POA: Diagnosis not present

## 2018-10-31 NOTE — Telephone Encounter (Signed)
Appointment 4/2 Karen [name] aware

## 2018-11-01 ENCOUNTER — Ambulatory Visit (INDEPENDENT_AMBULATORY_CARE_PROVIDER_SITE_OTHER): Payer: Medicare Other | Admitting: Family Medicine

## 2018-11-01 ENCOUNTER — Encounter: Payer: Self-pay | Admitting: Family Medicine

## 2018-11-01 VITALS — Ht 63.0 in

## 2018-11-01 DIAGNOSIS — R609 Edema, unspecified: Secondary | ICD-10-CM | POA: Diagnosis not present

## 2018-11-01 DIAGNOSIS — I509 Heart failure, unspecified: Secondary | ICD-10-CM | POA: Diagnosis not present

## 2018-11-01 DIAGNOSIS — I130 Hypertensive heart and chronic kidney disease with heart failure and stage 1 through stage 4 chronic kidney disease, or unspecified chronic kidney disease: Secondary | ICD-10-CM | POA: Diagnosis not present

## 2018-11-01 DIAGNOSIS — D631 Anemia in chronic kidney disease: Secondary | ICD-10-CM | POA: Diagnosis not present

## 2018-11-01 DIAGNOSIS — I160 Hypertensive urgency: Secondary | ICD-10-CM

## 2018-11-01 DIAGNOSIS — L03115 Cellulitis of right lower limb: Secondary | ICD-10-CM

## 2018-11-01 DIAGNOSIS — E1122 Type 2 diabetes mellitus with diabetic chronic kidney disease: Secondary | ICD-10-CM | POA: Diagnosis not present

## 2018-11-01 DIAGNOSIS — E1143 Type 2 diabetes mellitus with diabetic autonomic (poly)neuropathy: Secondary | ICD-10-CM | POA: Diagnosis not present

## 2018-11-01 DIAGNOSIS — I2111 ST elevation (STEMI) myocardial infarction involving right coronary artery: Secondary | ICD-10-CM | POA: Diagnosis not present

## 2018-11-01 DIAGNOSIS — N184 Chronic kidney disease, stage 4 (severe): Secondary | ICD-10-CM | POA: Diagnosis not present

## 2018-11-01 DIAGNOSIS — E1165 Type 2 diabetes mellitus with hyperglycemia: Secondary | ICD-10-CM | POA: Diagnosis not present

## 2018-11-01 DIAGNOSIS — I213 ST elevation (STEMI) myocardial infarction of unspecified site: Secondary | ICD-10-CM | POA: Diagnosis not present

## 2018-11-01 MED ORDER — CEFTRIAXONE SODIUM 1 G IJ SOLR
1.0000 g | Freq: Once | INTRAMUSCULAR | Status: AC
  Administered 2018-11-01: 1 g via INTRAMUSCULAR

## 2018-11-01 MED ORDER — CLINDAMYCIN HCL 300 MG PO CAPS: 300.0000 mg | ORAL_CAPSULE | Freq: Three times a day (TID) | ORAL | 0 refills | Status: DC

## 2018-11-01 NOTE — Patient Instructions (Signed)
Have Almyra Free administer 1 gram of rocephin IM x 1  Start clindamycin three times daily, can be hard on the stomach.. take with food. Increase lasix to 1.5 tabs ( 60 mg total) daily x 2 days.  Elevated leg above heart as much as able.  Mark right leg redness with marker.

## 2018-11-01 NOTE — Progress Notes (Signed)
VIRTUAL VISIT Due to national recommendations of social distancing due to [place] 19, a virtual visit is felt to be most appropriate for this patient at this time.   I connected with Karen [name] and her daughter in law Karen [name])  on 11/01/18 at  2:30 PM EDT by [facility] and verified that I am speaking with the correct person using two identifiers.   I discussed the limitations, risks, security and privacy concerns of performing an evaluation and management service by [facility] and the availability of in person appointments. I also discussed with the patient that there may be a patient responsible charge related to this service. The patient expressed understanding and agreed to proceed.  Patient location: Home Provider Location: [facility] Participants: Eliezer Lofts and [name]   Chief Complaint  Patient presents with  . Follow-up    3 month follow up  . Foot Swelling    History of Present Illness:  85 year old female with diabetes  Presents for [place] follow up as well as  New onset bilateral peripheral edema and right lower leg redness. Recent hospitalization 09/30/2018 with chest pain. Dx with STEMI, hypertensive emergency  Echocardiogram EF of 60 to 65%. LV diastolic parameters are consistent with impaired relaxation elevated left ventricular end-diastolic pressure.  Hospital summary  By problem copied below for informational purposes. STEMI -Patient presented with chest pain and was noted to have EKG changes with ST elevation in leads II and aVF, depression in I and aVL -Troponin peaked at 3, and is now downtrending -Cardiology consulted and appreciated, patient was placed on aspirin and heparin gtt -Given patient's age, it was decided against catheterization and to treat with medical management -Echocardiogram EF of 60 to 65%. LV diastolic parameters are consistent with impaired relaxation elevated left ventricular end-diastolic pressure -Continue metoprolol  (patient with mild bradycardia today, reduced dose of metoprolol) -Continue isosorbide mononitrate 30mg  daily, ASA 81mg  daily, metoprolol 12.5mg  BID  Hypertensive urgency -Upon arrival to the emergency department patient had elevated blood pressure of 205/69, has now improved but continues to be borderline high -Reducedmetoprolol due to bradycardia -Continue metoprolol and amlodipine  -BP better controlled and stable  Abdominal tenderness -Unremarkable on exam today -CT abdomen pelvis without acute findings -UA unremarkable, LFTs and lipase within normal limits on presentation   Dysuria -Patient reported dysuria however was afebrile with borderline WBC count -UA unremarkable for infection  ?MAI infection -CT chest showed nodularity in the right lower lobe and right middle lobe primarily with few nodules on the left, suspected infectious process such as MAI however findings not definitive/nodule right middle lobe measuring 8 mm -Patient denies fevers, night sweats, weight loss -Currently afebrile nontoxic-appearing -Was placed on ceftriaxone and azithromycin in the ED however this is currently held -Patient will need repeat noncontrast CT in 3 months to ensure resolution -Blood cultures show no growth to date  TODAY:   She reports that she has had increase in peripheral bilateral leg swelling  since going to rehab.   Right leg is more swollen than the left... it also more red, warm and painful in in last week.  no sores, no cuts. No ulcers. No known falls or injuries. No drainage. No fever.  She feels pretty good overall.. no flu like symptoms. She is elevating legs in recliner. She is taking lasix 40 mg daily.   HTN..  BP has been well controlled  At [facility] follow ups.  No chest pain. No SOB. BP Readings from Last 3  Encounters:  10/16/18 110/60  10/08/18 (!) 136/54  08/21/18 136/60    Diabetes:  Saw Dr. Cruzita Lederer ENDO.Marland Kitchen decreased Lantus down from 42 down to 36 UNits...   Plan to change to to cheaper insulin per pallitive care. Using medications without difficulties: Hypoglycemic episodes:no Hyperglycemic episodes:yes Feet problems: Blood Sugars averaging: FBS 61-69, 2 hour postprandial 148-240, occ 200 before eating  Has palliative care consult helping.   COVID 19 screen No recent travel or known exposure to COVID19 The patient denies respiratory symptoms of COVID 19 at this time.  The importance of social distancing was discussed today.   Review of Systems  Constitutional: Negative for chills, fever and malaise/fatigue.  HENT: Negative for ear pain.   Eyes: Negative for pain.  Respiratory: Negative for cough, sputum production and shortness of breath.   Cardiovascular: Negative for chest pain and palpitations.  Gastrointestinal: Negative for diarrhea, nausea and vomiting.  Genitourinary: Negative for dysuria.  Musculoskeletal: Negative for back pain.      Past Medical History:  Diagnosis Date  . A-fib ([place])   . Abnormal involuntary movements(781.0)   . Anemia   . Anxiety   . Anxiety state, unspecified   . Arthritis    body, neck  . Benign hypertensive heart disease   . Benign hypertensive heart disease with congestive heart failure ([place])   . Bladder disorder    over active  . Cancer ([place])    skin, removed  . Cervicalgia   . CHF (congestive heart failure) ([place])   . Chronic kidney disease, stage III (moderate) (HCC)   . Congestive heart failure, unspecified   . Contact dermatitis and other eczema, due to unspecified cause   . Corns and callosities   . Diabetes mellitus without complication ([place])   . Diarrhea   . Dyspnea 02/18/2015  . Edema   . Esophageal stricture   . Gastroparesis   . Gastroparesis   . GERD (gastroesophageal reflux disease)   . Hiatal hernia   . Hyperlipidemia   . Inflamed seborrheic keratosis   . Inflammatory disease of breast   . Irregular heartbeat   . Lumbago   . Mixed incontinence urge and stress  (female)(female)   . Nausea alone   . Nonspecific (abnormal) findings on radiological and other examination of skull and head   . Osteoarthrosis, unspecified whether generalized or localized, unspecified site   . Other and unspecified hyperlipidemia   . Other B-complex deficiencies   . Other functional disorder of bladder   . Other malaise and fatigue   . Other specified visual disturbances   . Pain in joint, lower leg   . Palpitations   . Peripheral vascular disease, unspecified ([place])   . Postmenopausal atrophic vaginitis   . Reflux esophagitis   . Seborrheic keratosis   . Sleep apnea   . Spasm of muscle   . Spinal stenosis   . Spinal stenosis, unspecified region other than cervical   . Stricture and stenosis of esophagus   . Stroke ([place])   . TIA (transient ischemic attack) 2012  . Transient ischemic attack (TIA), and cerebral infarction without residual deficits(V12.54)   . Type II or unspecified type diabetes mellitus with renal manifestations, not stated as uncontrolled(250.40)   . Unspecified constipation   . Unspecified essential hypertension   . Unspecified hereditary and idiopathic peripheral neuropathy   . Unspecified hypothyroidism   . Unspecified pruritic disorder   . Unspecified sleep apnea   . Unspecified vitamin D deficiency   .  Urinary tract infection, site not specified   . Vitamin B12 deficiency     reports that she has never smoked. She has never used smokeless tobacco. She reports that she does not drink alcohol or use drugs.   Current Outpatient Medications:  .  ALPRAZolam (XANAX) 0.5 MG tablet, TAKE 1 TABLET (0.5 MG TOTAL) BY MOUTH 3 (THREE) TIMES DAILY AS NEEDED., Disp: 90 tablet, Rfl: 0 .  amLODipine (NORVASC) 2.5 MG tablet, Take 1 tablet (2.5 mg total) by mouth daily., Disp: , Rfl:  .  atorvastatin (LIPITOR) 10 MG tablet, Take 1 tablet (10 mg total) by mouth daily at 6 PM., Disp: , Rfl:  .  CRANBERRY PO, Take 300 mg by mouth daily., Disp: , Rfl:  .  [facility]  NATURAL LUTEIN EYE HEALTH PO, Take 1 tablet by mouth daily. , Disp: , Rfl:  .  diclofenac sodium (VOLTAREN) 1 % GEL, Apply 4 g topically 4 (four) times daily. To bilateral knees, Disp: 100 g, Rfl: 5 .  furosemide (LASIX) 40 MG tablet, TAKE 1 TABLET BY MOUTH DAILY TO CONTROL EDEMA (Patient taking differently: Take 40 mg by mouth daily. TO CONTROL EDEMA), Disp: 90 tablet, Rfl: 0 .  gabapentin (NEURONTIN) 100 MG capsule, TAKE TWO CAPSULES BY MOUTH AT BEDTIME TO HELP WITH TREMORS (Patient taking differently: Take 200 mg by mouth at bedtime. TO HELP WITH TREMORS), Disp: 180 capsule, Rfl: 1 .  glucagon (GLUCAGON EMERGENCY) 1 MG injection, Inject 1 mg into the muscle once as needed for up to 1 dose., Disp: 1 each, Rfl: 12 .  HYDROcodone-acetaminophen (NORCO) 7.5-325 MG tablet, Take 1 tablet by mouth every 6 (six) hours as needed for moderate pain., Disp: 10 tablet, Rfl: 0 .  insulin lispro (HUMALOG) 100 UNIT/ML KwikPen, Inject under skin up to 50 units a day as advised. Dx:E11.29, Normal (Patient taking differently: Inject 10 Units into the skin See admin instructions. Inject 10 units at breakfast, 14 units at lunch and 16 units at bedtime.), Disp: 15 mL, Rfl: 11 .  Insulin Pen Needle (B-D ULTRAFINE III SHORT PEN) 31G X 8 MM MISC, Use with administering insulin. Dx. E11.29, Disp: 100 each, Rfl: 11 .  isosorbide mononitrate (IMDUR) 30 MG 24 hr tablet, Take 1 tablet (30 mg total) by mouth daily., Disp: , Rfl:  .  LANTUS SOLOSTAR 100 UNIT/ML Solostar Pen, INJECT 42 UNITS INTO THE SKIN AT BEDTIME (Patient taking differently: Inject 36 Units into the skin at bedtime. ), Disp: 14 pen, Rfl: 1 .  levothyroxine (SYNTHROID, LEVOTHROID) 125 MCG tablet, TAKE ONE TABLET BY MOUTH EVERY MORNING 30 MINUTES BEFORE BREAKFAST (Patient taking differently: Take 125 mcg by mouth every morning. ), Disp: 30 tablet, Rfl: 5 .  metoprolol tartrate (LOPRESSOR) 25 MG tablet, Take 0.5 tablets (12.5 mg total) by mouth 2 (two) times daily.,  Disp: , Rfl:  .  Morphine Sulfate (MORPHINE CONCENTRATE) 10 MG/0.5ML SOLN concentrated solution, Place 0.5 mLs (10 mg total) under the tongue every 4 (four) hours as needed for severe pain (Chest pain or anginal equivalent)., Disp: 15 mL, Rfl: 0 .  Multiple Vitamins-Minerals (PRESERVISION AREDS 2 PO), Take 1 tablet by mouth 2 (two) times daily. , Disp: , Rfl:  .  nitroGLYCERIN (NITROSTAT) 0.4 MG SL tablet, Place 1 tablet (0.4 mg total) under the tongue every 5 (five) minutes as needed for chest pain., Disp: 25 tablet, Rfl: 12 .  omeprazole (PRILOSEC) 40 MG capsule, TAKE ONE (1) CAPSULE BY MOUTH 2 TIMES DAILY FOR STOMACH, Disp:  180 capsule, Rfl: 1 .  ondansetron (ZOFRAN) 4 MG tablet, TAKE ONE TABLET BY MOUTH EVERY 4 HOURS AS NEEDED FOR NAUSEA/VOMITING, Disp: 30 tablet, Rfl: 0 .  ONE TOUCH ULTRA TEST test strip, Use to test sugar three times daily dx E11.29, Disp: 100 each, Rfl: 6 .  Polyethyl Glycol-Propyl Glycol (SYSTANE OP), Place 1 drop into both eyes 2 (two) times daily. , Disp: , Rfl:  .  senna (SENOKOT) 8.6 MG TABS tablet, Take 1 tablet by mouth daily as needed for mild constipation., Disp: , Rfl:  .  triamcinolone cream (KENALOG) 0.1 %, Apply 1 application topically as needed. Mix with CeraVe cream apply twice daily for dry skin, Disp: , Rfl:  .  trimethoprim (TRIMPEX) 100 MG tablet, TAKE ONE TABLET BY MOUTH EVERY NIGHT AT BEDTIME TO PREVENT BLADDER INFECTION., Disp: 30 tablet, Rfl: 11 .  Vitamin D, Ergocalciferol, (DRISDOL) 50000 units CAPS capsule, TAKE ONE CAPSULE BY MOUTH ON THE SAME DAY EVERY WEEK AS DIRECTED (Patient taking differently: Take 50,000 Units by mouth every Monday. ), Disp: 12 capsule, Rfl: 1 .  VITAMIN E PO, Take 400 Units by mouth daily., Disp: , Rfl:  .  aspirin EC 81 MG EC tablet, Take 1 tablet (81 mg total) by mouth daily. (Patient not taking: Reported on 11/01/2018), Disp: , Rfl:  .  insulin NPH Human (HUMULIN N) 100 UNIT/ML injection, Inject 0.14 mLs (14 Units total) into  the skin 2 (two) times daily before a meal. (Patient not taking: Reported on 11/01/2018), Disp: 10 mL, Rfl: 5 .  insulin regular (HUMULIN R) 100 units/mL injection, Inject 8 units in the am and 11 units before lunch and dinner. (Patient not taking: Reported on 11/01/2018), Disp: 10 mL, Rfl: 5   Observations/Objective: Height 5\' 3"  (1.6 m).  Physical Exam   Physical Exam Constitutional:      General: She is not in acute distress. Pulmonary:     Effort: Pulmonary effort is normal. No respiratory distress.  Neurological:     Mental Status: She is alert and oriented to person, place, and time.  Psychiatric:        Mood and Affect: Mood normal.        Behavior: Behavior normal. SKIN: Bilateral lower leg swelling 2 plus, right lower leg with erythematous patch on right medial leg... per daughter in law, warm...  pt in wheelchair    Assessment and Plan   Cellulitis of right lower leg  No systemic symptoms. Pt high risk for MRSA given recent hopsitalization. Pt also diabetic HIGH Risk of having pt come to office and be exposed to COVID 19 is high.. so daughter in law who is RN will come to the office to pick up 1 g rocephin and will administer with sterile technique IM x 1. Pt and daughter in law voice agreement. Also will start clindamycin TID x 10 days.  Hypertensive urgency Much improved with resolution of chest pain and on current BP med regimen.  Poorly controlled type 2 diabetes mellitus with autonomic neuropathy ([place]) IMproving control. Followed by ENDO.   THis pt makes lower leg infection high risk.  Close follow up on cellulitis needed with repeat WEBEX in 24 hours, may need in person exam.  ST elevation myocardial infarction involving right coronary artery (HCC) ASA 81 mg daily.  medical management per cardiology.  No current pain.  Palliative care/hospice  Assisting.    I discussed the assessment and treatment plan with the patient. The patient was provided an  opportunity  to ask questions and all were answered. The patient agreed with the plan and demonstrated an understanding of the instructions.   The patient was advised to call back or seek an in-person evaluation if the symptoms worsen or if the condition fails to improve as anticipated.     Eliezer Lofts, MD

## 2018-11-02 ENCOUNTER — Encounter: Payer: Self-pay | Admitting: Family Medicine

## 2018-11-02 ENCOUNTER — Telehealth: Payer: Self-pay

## 2018-11-02 ENCOUNTER — Ambulatory Visit: Payer: Self-pay | Admitting: Family Medicine

## 2018-11-02 ENCOUNTER — Ambulatory Visit (INDEPENDENT_AMBULATORY_CARE_PROVIDER_SITE_OTHER): Payer: Medicare Other | Admitting: Family Medicine

## 2018-11-02 ENCOUNTER — Other Ambulatory Visit: Payer: Self-pay

## 2018-11-02 ENCOUNTER — Telehealth: Payer: Self-pay | Admitting: Physician Assistant

## 2018-11-02 ENCOUNTER — Other Ambulatory Visit: Payer: Self-pay | Admitting: Family Medicine

## 2018-11-02 DIAGNOSIS — L03115 Cellulitis of right lower limb: Secondary | ICD-10-CM | POA: Diagnosis not present

## 2018-11-02 DIAGNOSIS — R609 Edema, unspecified: Secondary | ICD-10-CM | POA: Insufficient documentation

## 2018-11-02 DIAGNOSIS — R000 Tachycardia, unspecified: Secondary | ICD-10-CM

## 2018-11-02 MED ORDER — ISOSORBIDE MONONITRATE ER 30 MG PO TB24: 30.0000 mg | ORAL_TABLET | Freq: Every day | ORAL | 1 refills | Status: DC

## 2018-11-02 MED ORDER — FLUCONAZOLE 150 MG PO TABS: 150.0000 mg | ORAL_TABLET | Freq: Once | ORAL | 0 refills | Status: AC

## 2018-11-02 MED ORDER — METOPROLOL TARTRATE 25 MG PO TABS: 12.5000 mg | ORAL_TABLET | Freq: Two times a day (BID) | ORAL | 1 refills | Status: DC

## 2018-11-02 MED ORDER — ATORVASTATIN CALCIUM 10 MG PO TABS: 10.0000 mg | ORAL_TABLET | Freq: Every day | ORAL | 1 refills | Status: AC

## 2018-11-02 MED ORDER — AMLODIPINE BESYLATE 2.5 MG PO TABS: 2.5000 mg | ORAL_TABLET | Freq: Every day | ORAL | 1 refills | Status: DC

## 2018-11-02 NOTE — Patient Instructions (Addendum)
Start oral antibitoics TID.Marland Kitchen. clindamycin.  Pt and daughter in law to call if redness spreading, fever or not tolerating oral antibiotics.  if needed continue lasix at 60 mg daily for a few more days.  Elevate feet above heart and try to increase movement as able.

## 2018-11-02 NOTE — Telephone Encounter (Signed)
Pt as webex OV this afternoon. Will address then.

## 2018-11-02 NOTE — Telephone Encounter (Signed)
Refills sent as requested

## 2018-11-02 NOTE — Telephone Encounter (Signed)
Inbound call to triage line - [name]/[facility] has requested assistance with medication management.  A. Per daughter-in-law, please prescribe an antibiotic to prevent UTI  B. Please prescribe Isosorbide Mononitrate 30 mg 24 hr tablet. Written 10/06/18 by Dr. Ree Kida, hospitalist. Prescription is lacking quantity and refill information.   Pharmacy is [facility].

## 2018-11-02 NOTE — Assessment & Plan Note (Signed)
ASA 81 mg daily.  medical management per cardiology.  No current pain.  Palliative care/hospice  Assisting.

## 2018-11-02 NOTE — Assessment & Plan Note (Signed)
Increase lasix to 60 mg x 2 days and aggressively elevate legs.

## 2018-11-02 NOTE — Assessment & Plan Note (Signed)
Much improved with resolution of chest pain and on current BP med regimen.

## 2018-11-02 NOTE — Telephone Encounter (Signed)
Inbound call to triage - Requested medication refills as follows:  Metoprolol - already addressed in separate note Amlodipine Atorvastatin  Send refills to [facility]

## 2018-11-02 NOTE — Telephone Encounter (Signed)
   TELEPHONE CALL NOTE  This patient has been deemed a candidate for follow-up tele-health visit to limit community exposure during the Covid-19 pandemic. I spoke with the patient and daughter-in-law via phone to discuss instructions. This has been outlined on the patient's AVS (dotphrase: hcevisitinfo). The patient was advised to review the section on consent for treatment as well. The patient will receive a phone call 2-3 days prior to their E-Visit at which time consent will be verbally confirmed. A Virtual Office Visit appointment type has been scheduled for hospital follow up with Robbie Lis, PAC, with "VIDEO" or "TELEPHONE" in the appointment notes - patient prefers Video type.  I have either confirmed the patient is active in MyChart or offered to send sign-up link to phone/email via Mychart icon beside patient's photo.  [place], [place] 11/02/2018 1:05 PM

## 2018-11-02 NOTE — Progress Notes (Signed)
VIRTUAL VISIT Due to national recommendations of social distancing due to [place] 19, a virtual visit is felt to be most appropriate for this patient at this time.   I connected with@ on 11/02/18 at  3:30 PM EDT by [facility] and verified that I am speaking with the correct person using two identifiers.   I discussed the limitations, risks, security and privacy concerns of performing an evaluation and management service by [facility] and the availability of in person appointments. I also discussed with the patient that there may be a patient responsible charge related to this service. The patient expressed understanding and agreed to proceed.  Patient location: Home Provider Location: [place] [facility] [facility] Participants: Eliezer Lofts and [name]   Chief Complaint  Patient presents with  . Follow-up    Cellulits    History of Present Illness:  85 year old female pt with diabetes presents for follow up bilateral edema and right lower leg cellulitis. She is s/p 1 g rocephin yesterday administered by daughter in law ( cardiac RN),  She was instructed to  increase in lasix to 60 mg daily and elevation of foot.  She was also started on clindamycin TID to cover MRSA given recent hosp admission and high MRSA risk.   Today she reports:  She reports right leg pain is better. Redness has shrunk back some, less heat in right leg Swelling is down some, but not much.  No flu like symptoms.  No CP, no SOB.   daughter in [place] requests refills of several medications as well as needs a prescription of diflucan given when on antibitoics she always get yeast infeciton.  COVID 19 screen No recent travel or known exposure to COVID19 The patient denies respiratory symptoms of COVID 19 at this time.  The importance of social distancing was discussed today.   Review of Systems  Constitutional: Negative for chills, fever and malaise/fatigue.  Respiratory: Negative for cough, sputum production and shortness of  breath.   Cardiovascular: Positive for leg swelling. Negative for chest pain.  Gastrointestinal: Negative for constipation and diarrhea.  Genitourinary: Negative for dysuria.      Past Medical History:  Diagnosis Date  . A-fib ([place])   . Abnormal involuntary movements(781.0)   . Anemia   . Anxiety   . Anxiety state, unspecified   . Arthritis    body, neck  . Benign hypertensive heart disease   . Benign hypertensive heart disease with congestive heart failure ([place])   . Bladder disorder    over active  . Cancer ([place])    skin, removed  . Cervicalgia   . CHF (congestive heart failure) ([place])   . Chronic kidney disease, stage III (moderate) (HCC)   . Congestive heart failure, unspecified   . Contact dermatitis and other eczema, due to unspecified cause   . Corns and callosities   . Diabetes mellitus without complication ([place])   . Diarrhea   . Dyspnea 02/18/2015  . Dysuria 10/01/2018  . Edema   . Esophageal stricture   . Gastroparesis   . Gastroparesis   . GERD (gastroesophageal reflux disease)   . Hiatal hernia   . Hyperlipidemia   . Inflamed seborrheic keratosis   . Inflammatory disease of breast   . Irregular heartbeat   . Lumbago   . Mixed incontinence urge and stress (female)(female)   . Nausea alone   . Nonspecific (abnormal) findings on radiological and other examination of skull and head   . Osteoarthrosis, unspecified whether  generalized or localized, unspecified site   . Other and unspecified hyperlipidemia   . Other B-complex deficiencies   . Other functional disorder of bladder   . Other malaise and fatigue   . Other specified visual disturbances   . Pain in joint, lower leg   . Palpitations   . Peripheral vascular disease, unspecified ([place])   . Postmenopausal atrophic vaginitis   . Reflux esophagitis   . Seborrheic keratosis   . Sleep apnea   . Spasm of muscle   . Spinal stenosis   . Spinal stenosis, unspecified region other than cervical   . Stricture and  stenosis of esophagus   . Stroke ([place])   . TIA (transient ischemic attack) 2012  . Transient ischemic attack (TIA), and cerebral infarction without residual deficits(V12.54)   . Type II or unspecified type diabetes mellitus with renal manifestations, not stated as uncontrolled(250.40)   . Unspecified constipation   . Unspecified essential hypertension   . Unspecified hereditary and idiopathic peripheral neuropathy   . Unspecified hypothyroidism   . Unspecified pruritic disorder   . Unspecified sleep apnea   . Unspecified vitamin D deficiency   . Urinary tract infection, site not specified   . Vitamin B12 deficiency     reports that she has never smoked. She has never used smokeless tobacco. She reports that she does not drink alcohol or use drugs.   Current Outpatient Medications:  .  ALPRAZolam (XANAX) 0.5 MG tablet, TAKE 1 TABLET (0.5 MG TOTAL) BY MOUTH 3 (THREE) TIMES DAILY AS NEEDED., Disp: 90 tablet, Rfl: 0 .  amLODipine (NORVASC) 2.5 MG tablet, Take 1 tablet (2.5 mg total) by mouth daily., Disp: 90 tablet, Rfl: 1 .  atorvastatin (LIPITOR) 10 MG tablet, Take 1 tablet (10 mg total) by mouth daily at 6 PM., Disp: 90 tablet, Rfl: 1 .  clindamycin (CLEOCIN) 300 MG capsule, Take 1 capsule (300 mg total) by mouth 3 (three) times daily., Disp: 30 capsule, Rfl: 0 .  CRANBERRY PO, Take 300 mg by mouth daily., Disp: , Rfl:  .  [facility] NATURAL LUTEIN EYE HEALTH PO, Take 1 tablet by mouth daily. , Disp: , Rfl:  .  diclofenac sodium (VOLTAREN) 1 % GEL, Apply 4 g topically 4 (four) times daily. To bilateral knees, Disp: 100 g, Rfl: 5 .  furosemide (LASIX) 40 MG tablet, TAKE 1 TABLET BY MOUTH DAILY TO CONTROL EDEMA (Patient taking differently: Take 40 mg by mouth daily. TO CONTROL EDEMA), Disp: 90 tablet, Rfl: 0 .  gabapentin (NEURONTIN) 100 MG capsule, TAKE TWO CAPSULES BY MOUTH AT BEDTIME TO HELP WITH TREMORS (Patient taking differently: Take 200 mg by mouth at bedtime. TO HELP WITH TREMORS), Disp: 180  capsule, Rfl: 1 .  glucagon (GLUCAGON EMERGENCY) 1 MG injection, Inject 1 mg into the muscle once as needed for up to 1 dose., Disp: 1 each, Rfl: 12 .  HYDROcodone-acetaminophen (NORCO) 7.5-325 MG tablet, Take 1 tablet by mouth every 6 (six) hours as needed for moderate pain., Disp: 10 tablet, Rfl: 0 .  insulin lispro (HUMALOG) 100 UNIT/ML KwikPen, Inject under skin up to 50 units a day as advised. Dx:E11.29, Normal (Patient taking differently: Inject 10 Units into the skin See admin instructions. Inject 10 units at breakfast, 14 units at lunch and 16 units at bedtime.), Disp: 15 mL, Rfl: 11 .  insulin NPH Human (HUMULIN N) 100 UNIT/ML injection, Inject 0.14 mLs (14 Units total) into the skin 2 (two) times daily before a meal., Disp:  10 mL, Rfl: 5 .  Insulin Pen Needle (B-D ULTRAFINE III SHORT PEN) 31G X 8 MM MISC, Use with administering insulin. Dx. E11.29, Disp: 100 each, Rfl: 11 .  insulin regular (HUMULIN R) 100 units/mL injection, Inject 8 units in the am and 11 units before lunch and dinner., Disp: 10 mL, Rfl: 5 .  isosorbide mononitrate (IMDUR) 30 MG 24 hr tablet, Take 1 tablet (30 mg total) by mouth daily., Disp: , Rfl:  .  LANTUS SOLOSTAR 100 UNIT/ML Solostar Pen, INJECT 42 UNITS INTO THE SKIN AT BEDTIME (Patient taking differently: Inject 36 Units into the skin at bedtime. ), Disp: 14 pen, Rfl: 1 .  levothyroxine (SYNTHROID, LEVOTHROID) 125 MCG tablet, TAKE ONE TABLET BY MOUTH EVERY MORNING 30 MINUTES BEFORE BREAKFAST (Patient taking differently: Take 125 mcg by mouth every morning. ), Disp: 30 tablet, Rfl: 5 .  metoprolol tartrate (LOPRESSOR) 25 MG tablet, Take 0.5 tablets (12.5 mg total) by mouth 2 (two) times daily., Disp: 90 tablet, Rfl: 1 .  Morphine Sulfate (MORPHINE CONCENTRATE) 10 MG/0.5ML SOLN concentrated solution, Place 0.5 mLs (10 mg total) under the tongue every 4 (four) hours as needed for severe pain (Chest pain or anginal equivalent)., Disp: 15 mL, Rfl: 0 .  Multiple  Vitamins-Minerals (PRESERVISION AREDS 2 PO), Take 1 tablet by mouth 2 (two) times daily. , Disp: , Rfl:  .  nitroGLYCERIN (NITROSTAT) 0.4 MG SL tablet, Place 1 tablet (0.4 mg total) under the tongue every 5 (five) minutes as needed for chest pain., Disp: 25 tablet, Rfl: 12 .  omeprazole (PRILOSEC) 40 MG capsule, TAKE ONE (1) CAPSULE BY MOUTH 2 TIMES DAILY FOR STOMACH, Disp: 180 capsule, Rfl: 1 .  ondansetron (ZOFRAN) 4 MG tablet, TAKE ONE TABLET BY MOUTH EVERY 4 HOURS AS NEEDED FOR NAUSEA/VOMITING, Disp: 30 tablet, Rfl: 0 .  ONE TOUCH ULTRA TEST test strip, Use to test sugar three times daily dx E11.29, Disp: 100 each, Rfl: 6 .  Polyethyl Glycol-Propyl Glycol (SYSTANE OP), Place 1 drop into both eyes 2 (two) times daily. , Disp: , Rfl:  .  senna (SENOKOT) 8.6 MG TABS tablet, Take 1 tablet by mouth daily as needed for mild constipation., Disp: , Rfl:  .  triamcinolone cream (KENALOG) 0.1 %, Apply 1 application topically as needed. Mix with CeraVe cream apply twice daily for dry skin, Disp: , Rfl:  .  trimethoprim (TRIMPEX) 100 MG tablet, TAKE ONE TABLET BY MOUTH EVERY NIGHT AT BEDTIME TO PREVENT BLADDER INFECTION., Disp: 30 tablet, Rfl: 11 .  Vitamin D, Ergocalciferol, (DRISDOL) 50000 units CAPS capsule, TAKE ONE CAPSULE BY MOUTH ON THE SAME DAY EVERY WEEK AS DIRECTED (Patient taking differently: Take 50,000 Units by mouth every Monday. ), Disp: 12 capsule, Rfl: 1 .  VITAMIN E PO, Take 400 Units by mouth daily., Disp: , Rfl:  .  aspirin EC 81 MG EC tablet, Take 1 tablet (81 mg total) by mouth daily. (Patient not taking: Reported on 11/01/2018), Disp: , Rfl:    Observations/Objective: Height 5\' 3"  (1.6 m).  Physical Exam  Physical Exam Constitutional:      General: She is not in acute distress. Pulmonary:     Effort: Pulmonary effort is normal. No respiratory distress.  Neurological:     Mental Status: She is alert and oriented to person, place, and time.  Psychiatric:        Mood and Affect:  Mood normal.        Behavior: Behavior normal.   SKIN: decreased  in redness back from line, no streaking up leg, bilateral 2 plus pitting edema continued   There is also small amount of erythema on left leg anteriorly that suggests redness may be instead due to edema.  Assessment and Plan  Cellulitis of right lower leg  Discussed option of additional rocephin injeciton... given improving cellultis.Marland Kitchen will continue only with oral antibiotics.. has not started yet given daughter in law wants to go once only to drug store and needs other refills ( given desire to avoid exposure to [place])  Given diflucan to use if yeast infeciton develops after starting antibiotics.  Peripheral edema Minimal improvement continue slightly higher dose of  Lasix at 60 mg daily over next 24 hours. Continue elevation as well.   I discussed the assessment and treatment plan with the patient. The patient was provided an opportunity to ask questions and all were answered. The patient agreed with the plan and demonstrated an understanding of the instructions.   The patient was advised to call back or seek an in-person evaluation if the symptoms worsen or if the condition fails to improve as anticipated.     Eliezer Lofts, MD

## 2018-11-02 NOTE — Assessment & Plan Note (Signed)
Minimal improvement continue slightly higher dose of  Lasix at 60 mg daily over next 24 hours. Continue elevation as well.

## 2018-11-02 NOTE — Assessment & Plan Note (Signed)
Discussed option of additional rocephin injeciton... given improving cellultis.Marland Kitchen will continue only with oral antibiotics.. has not started yet given daughter in law wants to go once only to drug store and needs other refills ( given desire to avoid exposure to [place])  Given diflucan to use if yeast infeciton develops after starting antibiotics.

## 2018-11-02 NOTE — Telephone Encounter (Signed)
Noted  

## 2018-11-02 NOTE — Assessment & Plan Note (Addendum)
IMproving control. Followed by ENDO.   THis pt makes lower leg infection high risk.  Close follow up on cellulitis needed with repeat WEBEX in 24 hours, may need in person exam.

## 2018-11-02 NOTE — Assessment & Plan Note (Signed)
No systemic symptoms. Pt high risk for MRSA given recent hopsitalization. Pt also diabetic HIGH Risk of having pt come to office and be exposed to COVID 19 is high.. so daughter in law who is RN will come to the office to pick up 1 g rocephin and will administer with sterile technique IM x 1. Pt and daughter in law voice agreement. Also will start clindamycin TID x 10 days.

## 2018-11-05 ENCOUNTER — Telehealth: Payer: Self-pay | Admitting: *Deleted

## 2018-11-05 NOTE — Telephone Encounter (Signed)
Karen [name] with [facility] notified as instructed by telephone.

## 2018-11-05 NOTE — Telephone Encounter (Signed)
Izora Gala nurse with [facility] called stating that patient did a video visit Friday and she is feeling a lot better. Izora Gala stated that patient is having pain in both feet and they feel real tight. Izora Gala stated that she is wondering if the patient should increase her Gabapentin at night if this is due to diabetic neuropathy? Izora Gala stated that the patient told her the symptoms go away at night. Izora Gala wants to know what the doctor recommends? [facility]

## 2018-11-05 NOTE — Telephone Encounter (Signed)
Tightness is probably from the swelling.. She needs to continue to elevate her feet, up on bed and with pillows especially at night. I do not recommend increase in gabapentin at this time.

## 2018-11-07 ENCOUNTER — Telehealth: Payer: Self-pay | Admitting: Physician Assistant

## 2018-11-07 NOTE — Telephone Encounter (Signed)
Returned call to pts daughter n law, Rhunette Croft.  Left a message for her to call back re: pt's appt coming up 11/12/2018.

## 2018-11-07 NOTE — Telephone Encounter (Signed)
Spoke with patient who gave me verbal permission to speak with her daughter-in-law, Doug Sou.  Almyra Free would like for nurse to call her regarding Ms. [place] appointment on 11/13/2018.  She feels like the appointment may not be needed.

## 2018-11-08 ENCOUNTER — Telehealth: Payer: Self-pay | Admitting: Physician Assistant

## 2018-11-08 NOTE — Telephone Encounter (Signed)
Returned pts daughter n law call, Jamariah.  She wanted to cancel pts appt for 11/13/2018.  She states that pt is under Hospice care as of now and she will call if she needs [place].  She did want me to send the family's gratitude and gratefulness for how Dr. Acie Fredrickson took great care of the pt while she was in the hospital.

## 2018-11-08 NOTE — Telephone Encounter (Signed)
She can increase gabapentin to 300 mg at bedtime for neuropathy symptoms.. refill if needed.

## 2018-11-08 NOTE — Telephone Encounter (Signed)
Follow Up:     Daughter in-law returning your call about pt's appointment next week.

## 2018-11-08 NOTE — Telephone Encounter (Signed)
Returned call to [place], Daughter [employer].  Left another message for her to call back if she still had any questions regarding pts upcoming appt 11/13/2018.

## 2018-11-08 NOTE — Telephone Encounter (Signed)
Izora Gala, nurse with [facility] said that both of pts feet hurt at night and pt said there is numbness and tingling in both feet instead of swelling. Izora Gala wants to know if Dr Diona Browner would consider increasing gabapentin or what else could Dr Diona Browner recommend. Per Izora Gala pt is elevating her feet. [facility] [employer]

## 2018-11-08 NOTE — Telephone Encounter (Signed)
I appreciate the information. I'm sorry that she has not improved but am grateful that she will be kept comfortable with hospice.

## 2018-11-12 MED ORDER — GABAPENTIN 300 MG PO CAPS: 300.0000 mg | ORAL_CAPSULE | Freq: Every day | ORAL | 0 refills | Status: DC

## 2018-11-12 NOTE — Addendum Note (Signed)
Addended by: Carter Kitten on: 11/12/2018 09:54 AM   Modules accepted: Orders

## 2018-11-12 NOTE — Telephone Encounter (Signed)
Left message for Karen [name], Nurse with [facility] that Karen [name] can increase her Gabapentin to 300 mg at bedtime per Dr. Diona Browner.  [facility] [place]

## 2018-11-13 ENCOUNTER — Telehealth: Payer: Medicare Other | Admitting: Physician Assistant

## 2018-11-15 DIAGNOSIS — G894 Chronic pain syndrome: Secondary | ICD-10-CM | POA: Diagnosis not present

## 2018-11-15 DIAGNOSIS — R2689 Other abnormalities of gait and mobility: Secondary | ICD-10-CM | POA: Diagnosis not present

## 2018-11-15 DIAGNOSIS — M179 Osteoarthritis of knee, unspecified: Secondary | ICD-10-CM | POA: Diagnosis not present

## 2018-11-15 DIAGNOSIS — K219 Gastro-esophageal reflux disease without esophagitis: Secondary | ICD-10-CM | POA: Diagnosis not present

## 2018-11-23 ENCOUNTER — Telehealth: Payer: Self-pay

## 2018-11-23 NOTE — Telephone Encounter (Signed)
Make virtual OV to discuss options. Have daughter in law/caregiver present please

## 2018-11-23 NOTE — Telephone Encounter (Signed)
Karen [name] with [facility] left v/m; pt continues with bad neuropathic pain at night even though gabapentin was increased to 300 mg. When pt takes gabapentin 300 mg pt wakes up at 2 AM in pain. pts daughter gave pt gabapentin 400 mg one night and pt slept until 4 AM but was groggy the next day and pts daughter only did this 1 time. Please advise.

## 2018-11-23 NOTE — Telephone Encounter (Signed)
Appointment 4/28 Almyra Free aware

## 2018-11-26 ENCOUNTER — Ambulatory Visit (INDEPENDENT_AMBULATORY_CARE_PROVIDER_SITE_OTHER): Admitting: Internal Medicine

## 2018-11-26 ENCOUNTER — Encounter: Payer: Self-pay | Admitting: Internal Medicine

## 2018-11-26 DIAGNOSIS — D631 Anemia in chronic kidney disease: Secondary | ICD-10-CM | POA: Diagnosis not present

## 2018-11-26 DIAGNOSIS — I130 Hypertensive heart and chronic kidney disease with heart failure and stage 1 through stage 4 chronic kidney disease, or unspecified chronic kidney disease: Secondary | ICD-10-CM | POA: Diagnosis not present

## 2018-11-26 DIAGNOSIS — E1122 Type 2 diabetes mellitus with diabetic chronic kidney disease: Secondary | ICD-10-CM | POA: Diagnosis not present

## 2018-11-26 DIAGNOSIS — E1143 Type 2 diabetes mellitus with diabetic autonomic (poly)neuropathy: Secondary | ICD-10-CM | POA: Diagnosis not present

## 2018-11-26 DIAGNOSIS — E1165 Type 2 diabetes mellitus with hyperglycemia: Secondary | ICD-10-CM

## 2018-11-26 DIAGNOSIS — N184 Chronic kidney disease, stage 4 (severe): Secondary | ICD-10-CM | POA: Diagnosis not present

## 2018-11-26 DIAGNOSIS — E785 Hyperlipidemia, unspecified: Secondary | ICD-10-CM

## 2018-11-26 DIAGNOSIS — I213 ST elevation (STEMI) myocardial infarction of unspecified site: Secondary | ICD-10-CM | POA: Diagnosis not present

## 2018-11-26 DIAGNOSIS — E039 Hypothyroidism, unspecified: Secondary | ICD-10-CM

## 2018-11-26 DIAGNOSIS — I509 Heart failure, unspecified: Secondary | ICD-10-CM | POA: Diagnosis not present

## 2018-11-26 NOTE — Progress Notes (Signed)
Patient ID: Karen [name], female   DOB: [birth year], 85 y.o.   MRN: [id number]   Patient location: Home My location: Office  Referring Provider: Jinny Sanders, MD  I connected with the patient on 11/26/18 at 10:30 AM EDT by a video enabled telemedicine application and verified that I am speaking with the correct person.   I discussed the limitations of evaluation and management by telemedicine and the availability of in person appointments. The patient expressed understanding and agreed to proceed.   Details of the encounter are shown below.  HPI: Karen [name] is a 85 y.o.-year-old female, initially referred by her PCP, Dr. Diona [name], presenting for follow-up for DM2, dx in 1984, insulin-dependent since ~2000, uncontrolled, with complications (CKD, PN, macular degenera low if you can hear me you have your camera off so you will probably need to press on the bottom of the at the press on the bottom at the bottom of your screen tion). She is here with her daughter who offers part of the history especially related to her meals and past medical history.  The daughter lives an hour away from the patient.  Patient's son lives close to her.  He is also my patient Karen [name]).  Patient was admitted with chest pain 09/30/2018.  Reviewed latest HbA1c level: Lab Results  Component Value Date   HGBA1C 6.6 (H) 08/03/2018   HGBA1C 8.0 (H) 03/22/2018   HGBA1C 7.1 (H) 11/21/2017   HGBA1C 6.7 (H) 07/11/2017   HGBA1C 7.7 (H) 03/22/2017   HGBA1C 6.5 (H) 12/19/2016   HGBA1C 6.6 (H) 09/15/2016   HGBA1C 5.8 (H) 07/18/2016   HGBA1C 6.2 (H) 05/11/2016   HGBA1C 8.0 (H) 01/18/2016   Pt is on a regimen of: - Lantus 42 >> 35 units at bedtime  - Humalog 05-15-15 (19 if has dessert) units before each meal >> 05-17-15 units  Pt checks her sugars 3x a day - lower in last 4 days: - am: 60-150 (after Lantus dose decrease: 99-140) >> 50, 54-154, 194 - 2h after b'fast: n/c - before lunch: 100-150  >> n/c - 2h after lunch: n/c - before dinner: 140-200 >> 54-220, last 4 days: 50-80s - 2h after dinner: 190-220 >> 100-200s  - bedtime: n/c  - nighttime: n/c Lowest sugar was 37 - mid-day - took Humalog but did not eat enough for b'fast >> 50; she has hypoglycemia awareness in the 60s.  Highest sugar was 600 - steroid inj >> 300.  She lives by herself.  Glucometer: One Touch ultra  Pt's meals are: - Breakfast: cereal (Rice crispies) + 2% mild + toast + apple sauce + diet pepsi - Lunch: cheese or ham sandwich with tomatoes + diet pepsi  - Dinner: meat + 2 veggies + sometimes dessert + diet pepsi - Snacks: rice crispies + icecream bedtime - every other night; cheese puffs + drinks Propel  -+ CKD, last BUN/creatinine:  Lab Results  Component Value Date   BUN 41 (H) 10/08/2018   BUN 39 (H) 10/07/2018   CREATININE 1.71 (H) 10/08/2018   CREATININE 1.62 (H) 10/07/2018   -+ HL; last set of lipids: Lab Results  Component Value Date   CHOL 169 03/22/2018   HDL 30.70 (L) 03/22/2018   LDLCALC 88 11/21/2017   LDLDIRECT 122.0 03/22/2018   TRIG 234.0 (H) 03/22/2018   CHOLHDL 6 03/22/2018   - last eye exam was in 11/2017: No DR, but macular degeneration in the left eye. Dr. Bing Plume.  - she  has numbness and tingling, and also pain in her feet -she usually puts ice on her feet at night.  The pain has exacerbated.  She is on Neurontin-used to help with the tremors, though.  She also has a history of postsurgical hypothyroidism and is on levothyroxine 125 mcg daily.  Review latest TSH which is normal Lab Results  Component Value Date   TSH 3.56 03/22/2018   Her son (migration) has a history of thyroid cancer.  ROS: Constitutional: no weight gain/no weight loss, no fatigue, no subjective hyperthermia, no subjective hypothermia Eyes: no blurry vision, no xerophthalmia ENT: no sore throat, no nodules palpated in neck, no dysphagia, no odynophagia, no hoarseness Cardiovascular: no CP/no  SOB/no palpitations/no leg swelling Respiratory: no cough/no SOB/no wheezing Gastrointestinal: no N/no V/no D/no C/no acid reflux Musculoskeletal: no muscle aches/no joint aches Skin: no rashes, no hair loss Neurological: no tremors/+ numbness/+ tingling/no dizziness  I reviewed pt's medications, allergies, PMH, social hx, family hx, and changes were documented in the history of present illness. Otherwise, unchanged from my initial visit note.  Past Medical History:  Diagnosis Date  . A-fib ([place])   . Abnormal involuntary movements(781.0)   . Anemia   . Anxiety   . Anxiety state, unspecified   . Arthritis    body, neck  . Benign hypertensive heart disease   . Benign hypertensive heart disease with congestive heart failure ([place])   . Bladder disorder    over active  . Cancer ([place])    skin, removed  . Cervicalgia   . CHF (congestive heart failure) ([place])   . Chronic kidney disease, stage III (moderate) (H[facility])   . Congestive heart failure, unspecified   . Contact dermatitis and other eczema, due to unspecified cause   . Corns and callosities   . Diabetes mellitus without complication ([place])   . Diarrhea   . Dyspnea 02/18/2015  . Dysuria 10/01/2018  . Edema   . Esophageal stricture   . Gastroparesis   . Gastroparesis   . GERD (gastroesophageal reflux disease)   . Hiatal hernia   . Hyperlipidemia   . Inflamed seborrheic keratosis   . Inflammatory disease of breast   . Irregular heartbeat   . Lumbago   . Mixed incontinence urge and stress (female)(female)   . Nausea alone   . Nonspecific (abnormal) findings on radiological and other examination of skull and head   . Osteoarthrosis, unspecified whether generalized or localized, unspecified site   . Other and unspecified hyperlipidemia   . Other B-complex deficiencies   . Other functional disorder of bladder   . Other malaise and fatigue   . Other specified visual disturbances   . Pain in joint, lower leg   . Palpitations   .  Peripheral vascular disease, unspecified ([place])   . Postmenopausal atrophic vaginitis   . Reflux esophagitis   . Seborrheic keratosis   . Sleep apnea   . Spasm of muscle   . Spinal stenosis   . Spinal stenosis, unspecified region other than cervical   . Stricture and stenosis of esophagus   . Stroke ([place])   . TIA (transient ischemic attack) 2012  . Transient ischemic attack (TIA), and cerebral infarction without residual deficits(V12.54)   . Type II or unspecified type diabetes mellitus with renal manifestations, not stated as uncontrolled(250.40)   . Unspecified constipation   . Unspecified essential hypertension   . Unspecified hereditary and idiopathic peripheral neuropathy   . Unspecified hypothyroidism   . Unspecified pruritic  disorder   . Unspecified sleep apnea   . Unspecified vitamin D deficiency   . Urinary tract infection, site not specified   . Vitamin B12 deficiency    Past Surgical History:  Procedure Laterality Date  . ABDOMINAL HYSTERECTOMY    . APPENDECTOMY    . BACK SURGERY     x 2  . CESAREAN SECTION    . CHOLECYSTECTOMY  1991  . esophageal  2004,2006   dilation, Dr Lyla Son  . [place]   bilateral cataract  . hammer toes    . HERNIA REPAIR  1984aaaaaaaa  . SKIN CANCER DESTRUCTION    . THYROIDECTOMY, PARTIAL  1965   Social History   Socioeconomic History  . Marital status: Widowed    Spouse name: Not on file  . Number of children: 3  . Years of education: Not on file  . Highest education level: Not on file  Occupational History  . Occupation: retired  Scientific laboratory technician  . Financial resource strain: Not hard at all  . Food insecurity:    Worry: Never true    Inability: Never true  . Transportation needs:    Medical: No    Non-medical: No  Tobacco Use  . Smoking status: Never Smoker  . Smokeless tobacco: Never Used  Substance and Sexual Activity  . Alcohol use: No  . Drug use: No  . Sexual activity: Never  Lifestyle  .  Physical activity:    Days per week: 0 days    Minutes per session: 0 min  . Stress: Rather much  Relationships  . Social connections:    Talks on phone: More than three times a week    Gets together: More than three times a week    Attends religious service: More than 4 times per year    Active member of club or organization: Yes    Attends meetings of clubs or organizations: Never    Relationship status: Widowed  . Intimate partner violence:    Fear of current or ex partner: No    Emotionally abused: No    Physically abused: No    Forced sexual activity: No  Other Topics Concern  . Not on file  Social History Narrative  . Not on file   Current Outpatient Medications on File Prior to Visit  Medication Sig Dispense Refill  . ALPRAZolam (XANAX) 0.5 MG tablet TAKE 1 TABLET (0.5 MG TOTAL) BY MOUTH 3 (THREE) TIMES DAILY AS NEEDED. 90 tablet 0  . amLODipine (NORVASC) 2.5 MG tablet Take 1 tablet (2.5 mg total) by mouth daily. 90 tablet 1  . aspirin EC 81 MG EC tablet Take 1 tablet (81 mg total) by mouth daily. (Patient not taking: Reported on 11/01/2018)    . atorvastatin (LIPITOR) 10 MG tablet Take 1 tablet (10 mg total) by mouth daily at 6 PM. 90 tablet 1  . clindamycin (CLEOCIN) 300 MG capsule Take 1 capsule (300 mg total) by mouth 3 (three) times daily. 30 capsule 0  . CRANBERRY PO Take 300 mg by mouth daily.    . [facility] NATURAL LUTEIN EYE HEALTH PO Take 1 tablet by mouth daily.     . diclofenac sodium (VOLTAREN) 1 % GEL Apply 4 g topically 4 (four) times daily. To bilateral knees 100 g 5  . furosemide (LASIX) 40 MG tablet TAKE 1 TABLET BY MOUTH DAILY TO CONTROL EDEMA (Patient taking differently: Take 40 mg by mouth daily. TO CONTROL EDEMA) 90 tablet  0  . gabapentin (NEURONTIN) 300 MG capsule Take 1 capsule (300 mg total) by mouth at bedtime. 90 capsule 0  . glucagon (GLUCAGON EMERGENCY) 1 MG injection Inject 1 mg into the muscle once as needed for up to 1 dose. 1 each 12  .  HYDROcodone-acetaminophen (NORCO) 7.5-325 MG tablet Take 1 tablet by mouth every 6 (six) hours as needed for moderate pain. 10 tablet 0  . insulin lispro (HUMALOG) 100 UNIT/ML KwikPen Inject under skin up to 50 units a day as advised. Dx:E11.29, Normal (Patient taking differently: Inject 10 Units into the skin See admin instructions. Inject 10 units at breakfast, 14 units at lunch and 16 units at bedtime.) 15 mL 11  . insulin NPH Human (HUMULIN N) 100 UNIT/ML injection Inject 0.14 mLs (14 Units total) into the skin 2 (two) times daily before a meal. 10 mL 5  . Insulin Pen Needle (B-D ULTRAFINE III SHORT PEN) 31G X 8 MM MISC Use with administering insulin. Dx. E11.29 100 each 11  . insulin regular (HUMULIN R) 100 units/mL injection Inject 8 units in the am and 11 units before lunch and dinner. 10 mL 5  . isosorbide mononitrate (IMDUR) 30 MG 24 hr tablet Take 1 tablet (30 mg total) by mouth daily. 90 tablet 1  . LANTUS SOLOSTAR 100 UNIT/ML Solostar Pen INJECT 42 UNITS INTO THE SKIN AT BEDTIME (Patient taking differently: Inject 36 Units into the skin at bedtime. ) 14 pen 1  . levothyroxine (SYNTHROID, LEVOTHROID) 125 MCG tablet TAKE ONE TABLET BY MOUTH EVERY MORNING 30 MINUTES BEFORE BREAKFAST (Patient taking differently: Take 125 mcg by mouth every morning. ) 30 tablet 5  . metoprolol tartrate (LOPRESSOR) 25 MG tablet Take 0.5 tablets (12.5 mg total) by mouth 2 (two) times daily. 90 tablet 1  . Morphine Sulfate (MORPHINE CONCENTRATE) 10 MG/0.5ML SOLN concentrated solution Place 0.5 mLs (10 mg total) under the tongue every 4 (four) hours as needed for severe pain (Chest pain or anginal equivalent). 15 mL 0  . Multiple Vitamins-Minerals (PRESERVISION AREDS 2 PO) Take 1 tablet by mouth 2 (two) times daily.     . nitroGLYCERIN (NITROSTAT) 0.4 MG SL tablet Place 1 tablet (0.4 mg total) under the tongue every 5 (five) minutes as needed for chest pain. 25 tablet 12  . omeprazole (PRILOSEC) 40 MG capsule TAKE ONE  (1) CAPSULE BY MOUTH 2 TIMES DAILY FOR STOMACH 180 capsule 1  . ondansetron (ZOFRAN) 4 MG tablet TAKE ONE TABLET BY MOUTH EVERY 4 HOURS AS NEEDED FOR NAUSEA/VOMITING 30 tablet 0  . ONE TOUCH ULTRA TEST test strip Use to test sugar three times daily dx E11.29 100 each 6  . Polyethyl Glycol-Propyl Glycol (SYSTANE OP) Place 1 drop into both eyes 2 (two) times daily.     Marland Kitchen senna (SENOKOT) 8.6 MG TABS tablet Take 1 tablet by mouth daily as needed for mild constipation.    . triamcinolone cream (KENALOG) 0.1 % Apply 1 application topically as needed. Mix with CeraVe cream apply twice daily for dry skin    . trimethoprim (TRIMPEX) 100 MG tablet TAKE ONE TABLET BY MOUTH EVERY NIGHT AT BEDTIME TO PREVENT BLADDER INFECTION. 30 tablet 11  . Vitamin D, Ergocalciferol, (DRISDOL) 50000 units CAPS capsule TAKE ONE CAPSULE BY MOUTH ON THE SAME DAY EVERY WEEK AS DIRECTED (Patient taking differently: Take 50,000 Units by mouth every Monday. ) 12 capsule 1  . VITAMIN E PO Take 400 Units by mouth daily.     No  current facility-administered medications on file prior to visit.    Allergies  Allergen Reactions  . Celebrex [Celecoxib] Rash  . Cymbalta [Duloxetine Hcl] Rash    Lip numbness,rash, leg and body jerks. Patient states " I though I was having a heart attack or stroke."   . Doxycycline Rash  . Lyrica [Pregabalin] Rash  . Avandia [Rosiglitazone] Rash  . Macrodantin [Nitrofurantoin Macrocrystal] Rash  . Silicone Rash    Gets rash from the touch it.  . Sulfa Antibiotics Rash  . Vioxx [Rofecoxib] Rash   Family History  Problem Relation Age of Onset  . Cancer Mother        ? stomach  . Heart disease Father   . Hyperlipidemia Father   . Hypertension Father   . Heart attack Father   . Heart disease Brother   . Kidney disease Daughter   . Heart disease Brother   . Cancer Son   Daughter has a history of skin cancer while son has a history of thyroid cancer.  PE: There were no vitals taken for this  visit. Wt Readings from Last 3 Encounters:  10/16/18 136 lb 4.8 oz (61.8 kg)  10/08/18 133 lb 6.4 oz (60.5 kg)  08/21/18 137 lb (62.1 kg)   Constitutional:  in NAD  The physical exam was not performed (virtual visit).  ASSESSMENT: 1. DM2, insulin-dependent, uncontrolled, with long-term complications - CKD - PN - macular degeneration  2. HL  3. Hypothyroidism  PLAN:  1. Patient with longstanding, uncontrolled, type 2 diabetes, on basal-bolus insulin regimen with history of low blood sugar episodes in the morning when she was on higher dose of Lantus.  This was decreased the week before last visit and she did not have any low blood sugars since.  Her a.m. sugars were at goal at last visit, before lunch she was at or close to goal but sugars increase significantly before dinner and they remain high after dinner.  At that time she was telling me that she was having a high carb breakfast which she was covering well with Humalog.  However, she was drinking sodas with every meal and I advised her to stop these, even though they were sugar-free.  Also, she had rice crispies and ice cream after dinner which I advised her to replace that with healthier snacks.  At that time, we increased her Humalog with lunch slightly. -At this visit, her daughter-in-law tells me that her sugars are lower in the last 4 days, mostly in the morning and before dinner.  These are in the 50s to 80s range.  After dinner, her sugars are higher, 100-200.  For now, we discussed about reducing the dose of Lantus and maintaining the dose of Humalog.  They are questioning whether she can be switched to NPH, but I recommended against it due to the higher propensity for low blood sugars and in general more fluctuating CBGs. -She also has increased peripheral neuropathy pain and we discussed about starting alpha lipoic acid and B complex. - I suggested to:  Patient Instructions  Please continue: - Humalog 05-17-15 units before  each meal  Please decrease: - Lantus to 28-30 units  Can try the following combination for neuropathy: - alpha-lipoic acid 600 mg twice a day - B complex 1 tablet a day  Please return in 3 months with your sugar log.   -We will recheck her HbA1c when she returns to the clinic - CBG targets for treatment: 90-150 mg/dL before meals and <  200 mg/dL after meals; target HbA1c <7.5% -due to age. - continue checking sugars at different times of the day - check 3x a day, rotating checks - advised for yearly eye exams >> she is UTD - Return to clinic in 3 mo with sugar log   2. HL - Reviewed latest lipid panel: LDL higher than goal, TG high, HDL low Lab Results  Component Value Date   CHOL 169 03/22/2018   HDL 30.70 (L) 03/22/2018   LDLCALC 88 11/21/2017   LDLDIRECT 122.0 03/22/2018   TRIG 234.0 (H) 03/22/2018   CHOLHDL 6 03/22/2018  -She is not on a statin.  3. Hypothyroidism - managed by PCP - on LT4 125 mcg daily - she does not miss doses - latest TSH reviewed >> at goal  Philemon Kingdom, MD PhD [facility]

## 2018-11-26 NOTE — Patient Instructions (Addendum)
Please continue: - Humalog 05-17-15 units before each meal  Please decrease: - Lantus to 28-30 units  Can try the following combination for neuropathy: - alpha-lipoic acid 600 mg twice a day - B complex 1 tablet a day  Please return in 3 months with your sugar log.

## 2018-11-26 NOTE — Telephone Encounter (Signed)
Karen [name] with [facility] left v/m as update; over weekend pts daughter gave pt roxinol 1/2 dose at night and the another 1/2 dose later that night due to pain in feet. The endocrinologist spoke with pt family today and offered vitamins instead of increasing gabapentin.  Karen [name] said does not need cb unless there are any changes. FYI to Dr Diona Browner. Pt has a [facility] appt with Dr Diona Browner 11/27/18 at 2:40.

## 2018-11-27 ENCOUNTER — Ambulatory Visit (INDEPENDENT_AMBULATORY_CARE_PROVIDER_SITE_OTHER): Admitting: Family Medicine

## 2018-11-27 ENCOUNTER — Encounter: Payer: Self-pay | Admitting: Family Medicine

## 2018-11-27 VITALS — Ht 63.0 in | Wt 140.0 lb

## 2018-11-27 DIAGNOSIS — I130 Hypertensive heart and chronic kidney disease with heart failure and stage 1 through stage 4 chronic kidney disease, or unspecified chronic kidney disease: Secondary | ICD-10-CM | POA: Diagnosis not present

## 2018-11-27 DIAGNOSIS — I83893 Varicose veins of bilateral lower extremities with other complications: Secondary | ICD-10-CM | POA: Diagnosis not present

## 2018-11-27 DIAGNOSIS — E1143 Type 2 diabetes mellitus with diabetic autonomic (poly)neuropathy: Secondary | ICD-10-CM | POA: Diagnosis not present

## 2018-11-27 DIAGNOSIS — I872 Venous insufficiency (chronic) (peripheral): Secondary | ICD-10-CM

## 2018-11-27 DIAGNOSIS — L97421 Non-pressure chronic ulcer of left heel and midfoot limited to breakdown of skin: Secondary | ICD-10-CM | POA: Diagnosis not present

## 2018-11-27 DIAGNOSIS — E1340 Other specified diabetes mellitus with diabetic neuropathy, unspecified: Secondary | ICD-10-CM

## 2018-11-27 DIAGNOSIS — E1122 Type 2 diabetes mellitus with diabetic chronic kidney disease: Secondary | ICD-10-CM | POA: Diagnosis not present

## 2018-11-27 DIAGNOSIS — I509 Heart failure, unspecified: Secondary | ICD-10-CM | POA: Diagnosis not present

## 2018-11-27 DIAGNOSIS — N184 Chronic kidney disease, stage 4 (severe): Secondary | ICD-10-CM | POA: Diagnosis not present

## 2018-11-27 DIAGNOSIS — E1165 Type 2 diabetes mellitus with hyperglycemia: Secondary | ICD-10-CM

## 2018-11-27 DIAGNOSIS — E162 Hypoglycemia, unspecified: Secondary | ICD-10-CM | POA: Diagnosis not present

## 2018-11-27 DIAGNOSIS — D631 Anemia in chronic kidney disease: Secondary | ICD-10-CM | POA: Diagnosis not present

## 2018-11-27 DIAGNOSIS — I213 ST elevation (STEMI) myocardial infarction of unspecified site: Secondary | ICD-10-CM | POA: Diagnosis not present

## 2018-11-27 NOTE — Progress Notes (Signed)
Labs 4/29 Follow up 5/14 [name] pt son aware

## 2018-11-27 NOTE — Progress Notes (Signed)
VIRTUAL VISIT Due to national recommendations of social distancing due to [place] 19, a virtual visit is felt to be most appropriate for this patient at this time.   I connected with the patient on 11/27/18 at  2:40 PM EDT by virtual telehealth platform and verified that I am speaking with the correct person using two identifiers.   I discussed the limitations, risks, security and privacy concerns of performing an evaluation and management service by  virtual telehealth platform and the availability of in person appointments. I also discussed with the patient that there may be a patient responsible charge related to this service. The patient expressed understanding and agreed to proceed.  Patient location: Home Provider Location: [facility] Participants: Eliezer Lofts and [name]   Chief Complaint  Patient presents with  . Follow-up    Neuropathic Pain    History of Present Illness: 85 year old female presents for follow up on neuropathic pain in bilateral feet. Daughter in law present to assist with call.  She is currently on gabapentin... had a fall last week but none further.. no longer unsteady on feet. Pain in feet is better but may be better with additional dose of morphine in AM and PM. Burning in toes but also She has a sore on back of right heel ( dry scaly, with central blackness, 1 cm, redness 3-4 cm... shoe was rubbing. Noted yesterday. No warmth, no discharge No odor, no discharge.   Bilateral swollen, but red like previous cellulitis.    Hospice nurse  PRN available... can drop off duoderm.   DM followed by ENDO. Last appt reviewed 11/26/2018 Discussed hypoglycemia.. reduced insulin. Recently added vitamins to help with neuropathy. -alpha-lipoic acid 600 mg twice a day - B complex 1 tablet a day  Using roxinol for pain   COVID 19 screen No recent travel or known exposure to [place] The patient denies respiratory symptoms of COVID 19 at  this time.  The importance of social distancing was discussed today.   Review of Systems  Constitutional: Negative for chills and fever.  HENT: Negative for congestion and ear pain.   Eyes: Negative for pain and redness.  Respiratory: Negative for cough and shortness of breath.   Cardiovascular: Negative for chest pain, palpitations and leg swelling.  Gastrointestinal: Negative for abdominal pain, blood in stool, constipation, diarrhea, nausea and vomiting.  Genitourinary: Negative for dysuria.  Musculoskeletal: Negative for falls and myalgias.  Skin: Negative for rash.  Neurological: Negative for dizziness.  Psychiatric/Behavioral: Negative for depression. The patient is not nervous/anxious.       Past Medical History:  Diagnosis Date  . A-fib ([place])   . Abnormal involuntary movements(781.0)   . Anemia   . Anxiety   . Anxiety state, unspecified   . Arthritis    body, neck  . Benign hypertensive heart disease   . Benign hypertensive heart disease with congestive heart failure ([place])   . Bladder disorder    over active  . Cancer ([place])    skin, removed  . Cervicalgia   . CHF (congestive heart failure) ([place])   . Chronic kidney disease, stage III (moderate) (HCC)   . Congestive heart failure, unspecified   . Contact dermatitis and other eczema, due to unspecified cause   . Corns and callosities   . Diabetes mellitus without complication ([place])   . Diarrhea   . Dyspnea 02/18/2015  . Dysuria 10/01/2018  . Edema   . Esophageal stricture   .  Gastroparesis   . Gastroparesis   . GERD (gastroesophageal reflux disease)   . Hiatal hernia   . Hyperlipidemia   . Inflamed seborrheic keratosis   . Inflammatory disease of breast   . Irregular heartbeat   . Lumbago   . Mixed incontinence urge and stress (female)(female)   . Nausea alone   . Nonspecific (abnormal) findings on radiological and other examination of skull and head   . Osteoarthrosis, unspecified whether generalized or  localized, unspecified site   . Other and unspecified hyperlipidemia   . Other B-complex deficiencies   . Other functional disorder of bladder   . Other malaise and fatigue   . Other specified visual disturbances   . Pain in joint, lower leg   . Palpitations   . Peripheral vascular disease, unspecified ([place])   . Postmenopausal atrophic vaginitis   . Reflux esophagitis   . Seborrheic keratosis   . Sleep apnea   . Spasm of muscle   . Spinal stenosis   . Spinal stenosis, unspecified region other than cervical   . Stricture and stenosis of esophagus   . Stroke ([place])   . TIA (transient ischemic attack) 2012  . Transient ischemic attack (TIA), and cerebral infarction without residual deficits(V12.54)   . Type II or unspecified type diabetes mellitus with renal manifestations, not stated as uncontrolled(250.40)   . Unspecified constipation   . Unspecified essential hypertension   . Unspecified hereditary and idiopathic peripheral neuropathy   . Unspecified hypothyroidism   . Unspecified pruritic disorder   . Unspecified sleep apnea   . Unspecified vitamin D deficiency   . Urinary tract infection, site not specified   . Vitamin B12 deficiency     reports that she has never smoked. She has never used smokeless tobacco. She reports that she does not drink alcohol or use drugs.   Current Outpatient Medications:  .  Alpha-Lipoic Acid 600 MG TABS, Take 1 tablet by mouth 2 (two) times a day., Disp: , Rfl:  .  ALPRAZolam (XANAX) 0.5 MG tablet, TAKE 1 TABLET (0.5 MG TOTAL) BY MOUTH 3 (THREE) TIMES DAILY AS NEEDED., Disp: 90 tablet, Rfl: 0 .  amLODipine (NORVASC) 2.5 MG tablet, Take 1 tablet (2.5 mg total) by mouth daily., Disp: 90 tablet, Rfl: 1 .  aspirin EC 81 MG EC tablet, Take 1 tablet (81 mg total) by mouth daily., Disp: , Rfl:  .  atorvastatin (LIPITOR) 10 MG tablet, Take 1 tablet (10 mg total) by mouth daily at 6 PM., Disp: 90 tablet, Rfl: 1 .  b complex vitamins tablet, Take 1 tablet  by mouth daily., Disp: , Rfl:  .  Cholecalciferol (VITAMIN D3) 1.25 MG (50000 UT) TABS, Take 1 tablet by mouth. every Monday., Disp: , Rfl:  .  CRANBERRY PO, Take 300 mg by mouth daily., Disp: , Rfl:  .  [employer] NATURAL LUTEIN EYE HEALTH PO, Take 1 tablet by mouth daily. , Disp: , Rfl:  .  diclofenac sodium (VOLTAREN) 1 % GEL, Apply 4 g topically 4 (four) times daily. To bilateral knees, Disp: 100 g, Rfl: 5 .  furosemide (LASIX) 40 MG tablet, Take 40 mg by mouth daily., Disp: , Rfl:  .  gabapentin (NEURONTIN) 300 MG capsule, Take 1 capsule (300 mg total) by mouth at bedtime., Disp: 90 capsule, Rfl: 0 .  glucagon (GLUCAGON EMERGENCY) 1 MG injection, Inject 1 mg into the muscle once as needed for up to 1 dose., Disp: 1 each, Rfl: 12 .  HYDROcodone-acetaminophen (  NORCO) 7.5-325 MG tablet, Take 1 tablet by mouth every 6 (six) hours as needed for moderate pain., Disp: 10 tablet, Rfl: 0 .  Insulin Glargine (LANTUS SOLOSTAR) 100 UNIT/ML Solostar Pen, Inject 28 Units into the skin at bedtime., Disp: , Rfl:  .  insulin lispro (HUMALOG KWIKPEN) 100 UNIT/ML KwikPen, Inject 10 units at breakfast, 16 units at lunch and 16 units at dinner, Disp: , Rfl:  .  Insulin Pen Needle (B-D ULTRAFINE III SHORT PEN) 31G X 8 MM MISC, Use with administering insulin. Dx. E11.29, Disp: 100 each, Rfl: 11 .  isosorbide mononitrate (IMDUR) 30 MG 24 hr tablet, Take 1 tablet (30 mg total) by mouth daily., Disp: 90 tablet, Rfl: 1 .  levothyroxine (SYNTHROID) 125 MCG tablet, Take 125 mcg by mouth daily before breakfast., Disp: , Rfl:  .  metoprolol tartrate (LOPRESSOR) 25 MG tablet, Take 0.5 tablets (12.5 mg total) by mouth 2 (two) times daily., Disp: 90 tablet, Rfl: 1 .  Morphine Sulfate (MORPHINE CONCENTRATE) 10 MG/0.5ML SOLN concentrated solution, Place 0.5 mLs (10 mg total) under the tongue every 4 (four) hours as needed for severe pain (Chest pain or anginal equivalent)., Disp: 15 mL, Rfl: 0 .  Multiple Vitamins-Minerals (PRESERVISION  AREDS 2 PO), Take 1 tablet by mouth 2 (two) times daily. , Disp: , Rfl:  .  nitroGLYCERIN (NITROSTAT) 0.4 MG SL tablet, Place 1 tablet (0.4 mg total) under the tongue every 5 (five) minutes as needed for chest pain., Disp: 25 tablet, Rfl: 12 .  omeprazole (PRILOSEC) 40 MG capsule, TAKE ONE (1) CAPSULE BY MOUTH 2 TIMES DAILY FOR STOMACH, Disp: 180 capsule, Rfl: 1 .  ondansetron (ZOFRAN) 4 MG tablet, TAKE ONE TABLET BY MOUTH EVERY 4 HOURS AS NEEDED FOR NAUSEA/VOMITING, Disp: 30 tablet, Rfl: 0 .  ONE TOUCH ULTRA TEST test strip, Use to test sugar three times daily dx E11.29, Disp: 100 each, Rfl: 6 .  Polyethyl Glycol-Propyl Glycol (SYSTANE OP), Place 1 drop into both eyes 2 (two) times daily. , Disp: , Rfl:  .  senna (SENOKOT) 8.6 MG TABS tablet, Take 1 tablet by mouth daily as needed for mild constipation., Disp: , Rfl:  .  triamcinolone cream (KENALOG) 0.1 %, Apply 1 application topically as needed. Mix with CeraVe cream apply twice daily for dry skin, Disp: , Rfl:  .  trimethoprim (TRIMPEX) 100 MG tablet, TAKE ONE TABLET BY MOUTH EVERY NIGHT AT BEDTIME TO PREVENT BLADDER INFECTION., Disp: 30 tablet, Rfl: 11 .  VITAMIN E PO, Take 400 Units by mouth daily., Disp: , Rfl:    Observations/Objective: Height 5\' 3"  (1.6 m), weight 140 lb (63.5 kg).  Physical Exam  Physical Exam Constitutional:      General: The patient is not in acute distress. Pulmonary:     Effort: Pulmonary effort is normal. No respiratory distress.  Neurological:     Mental Status: The patient is alert and oriented to person, place, and time.  Psychiatric:        Mood and Affect: Mood normal.        Behavior: Behavior normal.  Skin: bilateral ankles with approximately 1 plus edema, shallow ulcer with  2-3 cm surrounding erythema on right heel  Assessment and Plan Venous insufficiency of both lower extremities Likely contributing to peripheral swelling given pt is inactive.  Ulcer of left heel and midfoot, limited to  breakdown of skin ([place]) Not clearly infected wound but difficult to assess.. pt will come in for wbc check. Start applying Duoderm.  If not improving consider antibiotics and referral to wound care center.  Hospice available but restricting home visits given [place]. When able to do in home assessment.. I would recommend. Daughter in law will contact hospice.  Hypoglycemia Recent new drops of sugar despite endo lowering insulin.  follow closely.  Will look for possible causes contributing to new hypoglycemia.  Neuropathy due to secondary diabetes ([place]) Contributing to pain in legs.  Pt now on vitamins and gabapentin to treat.Will given vitamins more time to help.  Pain also better controlled with roxinol.  Poorly controlled type 2 diabetes mellitus with autonomic neuropathy (HCC) Fluctuating control.. followed closely by ENDO.     I discussed the assessment and treatment plan with the patient. The patient was provided an opportunity to ask questions and all were answered. The patient agreed with the plan and demonstrated an understanding of the instructions.   The patient was advised to call back or seek an in-person evaluation if the symptoms worsen or if the condition fails to improve as anticipated.     Eliezer Lofts, MD

## 2018-11-28 ENCOUNTER — Other Ambulatory Visit (INDEPENDENT_AMBULATORY_CARE_PROVIDER_SITE_OTHER)

## 2018-11-28 ENCOUNTER — Other Ambulatory Visit: Payer: Self-pay

## 2018-11-28 DIAGNOSIS — L97421 Non-pressure chronic ulcer of left heel and midfoot limited to breakdown of skin: Secondary | ICD-10-CM

## 2018-11-28 DIAGNOSIS — E162 Hypoglycemia, unspecified: Secondary | ICD-10-CM

## 2018-11-28 LAB — COMPREHENSIVE METABOLIC PANEL
ALT: 11 U/L (ref 0–35)
AST: 17 U/L (ref 0–37)
Albumin: 3.7 g/dL (ref 3.5–5.2)
Alkaline Phosphatase: 103 U/L (ref 39–117)
BUN: 40 mg/dL — ABNORMAL HIGH (ref 6–23)
CO2: 31 mEq/L (ref 19–32)
Calcium: 8.4 mg/dL (ref 8.4–10.5)
Chloride: 98 mEq/L (ref 96–112)
Creatinine, Ser: 1.98 mg/dL — ABNORMAL HIGH (ref 0.40–1.20)
GFR: 23.54 mL/min — ABNORMAL LOW (ref >60.00)
Glucose, Bld: 60 mg/dL — ABNORMAL LOW (ref 70–99)
Potassium: 4.6 mEq/L (ref 3.5–5.1)
Sodium: 137 mEq/L (ref 135–145)
Total Bilirubin: 0.4 mg/dL (ref 0.2–1.2)
Total Protein: 6.6 g/dL (ref 6.0–8.3)

## 2018-11-28 LAB — CBC WITH DIFFERENTIAL/PLATELET
Basophils Absolute: 0 10*3/uL (ref 0.0–0.1)
Basophils Relative: 0.4 % (ref 0.0–3.0)
Eosinophils Absolute: 0.3 10*3/uL (ref 0.0–0.7)
Eosinophils Relative: 2.6 % (ref 0.0–5.0)
HCT: 33.8 % — ABNORMAL LOW (ref 36.0–46.0)
Hemoglobin: 10.9 g/dL — ABNORMAL LOW (ref 12.0–15.0)
Lymphocytes Relative: 21.9 % (ref 12.0–46.0)
Lymphs Abs: 2.2 10*3/uL (ref 0.7–4.0)
MCHC: 32.4 g/dL (ref 30.0–36.0)
MCV: 93.3 fl (ref 78.0–100.0)
Monocytes Absolute: 0.9 10*3/uL (ref 0.1–1.0)
Monocytes Relative: 9.3 % (ref 3.0–12.0)
Neutro Abs: 6.5 10*3/uL (ref 1.4–7.7)
Neutrophils Relative %: 65.8 % (ref 43.0–77.0)
Platelets: 257 10*3/uL (ref 150.0–400.0)
RBC: 3.62 Mil/uL — ABNORMAL LOW (ref 3.87–5.11)
RDW: 14.5 % (ref 11.5–15.5)
WBC: 9.9 10*3/uL (ref 4.0–10.5)

## 2018-11-29 DIAGNOSIS — N184 Chronic kidney disease, stage 4 (severe): Secondary | ICD-10-CM | POA: Diagnosis not present

## 2018-11-29 DIAGNOSIS — E1122 Type 2 diabetes mellitus with diabetic chronic kidney disease: Secondary | ICD-10-CM | POA: Diagnosis not present

## 2018-11-29 DIAGNOSIS — D631 Anemia in chronic kidney disease: Secondary | ICD-10-CM | POA: Diagnosis not present

## 2018-11-29 DIAGNOSIS — I509 Heart failure, unspecified: Secondary | ICD-10-CM | POA: Diagnosis not present

## 2018-11-29 DIAGNOSIS — I213 ST elevation (STEMI) myocardial infarction of unspecified site: Secondary | ICD-10-CM | POA: Diagnosis not present

## 2018-11-29 DIAGNOSIS — I130 Hypertensive heart and chronic kidney disease with heart failure and stage 1 through stage 4 chronic kidney disease, or unspecified chronic kidney disease: Secondary | ICD-10-CM | POA: Diagnosis not present

## 2018-11-30 ENCOUNTER — Encounter: Payer: Self-pay | Admitting: Family Medicine

## 2018-11-30 DIAGNOSIS — I130 Hypertensive heart and chronic kidney disease with heart failure and stage 1 through stage 4 chronic kidney disease, or unspecified chronic kidney disease: Secondary | ICD-10-CM | POA: Diagnosis not present

## 2018-11-30 DIAGNOSIS — I213 ST elevation (STEMI) myocardial infarction of unspecified site: Secondary | ICD-10-CM | POA: Diagnosis not present

## 2018-11-30 DIAGNOSIS — E162 Hypoglycemia, unspecified: Secondary | ICD-10-CM | POA: Insufficient documentation

## 2018-11-30 DIAGNOSIS — I509 Heart failure, unspecified: Secondary | ICD-10-CM | POA: Diagnosis not present

## 2018-11-30 DIAGNOSIS — D631 Anemia in chronic kidney disease: Secondary | ICD-10-CM | POA: Diagnosis not present

## 2018-11-30 DIAGNOSIS — L97421 Non-pressure chronic ulcer of left heel and midfoot limited to breakdown of skin: Secondary | ICD-10-CM | POA: Insufficient documentation

## 2018-11-30 DIAGNOSIS — E1122 Type 2 diabetes mellitus with diabetic chronic kidney disease: Secondary | ICD-10-CM | POA: Diagnosis not present

## 2018-11-30 DIAGNOSIS — N184 Chronic kidney disease, stage 4 (severe): Secondary | ICD-10-CM | POA: Diagnosis not present

## 2018-11-30 NOTE — Assessment & Plan Note (Signed)
Fluctuating control.. followed closely by ENDO.

## 2018-11-30 NOTE — Assessment & Plan Note (Signed)
Contributing to pain in legs.  Pt now on vitamins and gabapentin to treat.Will given vitamins more time to help.  Pain also better controlled with roxinol.

## 2018-11-30 NOTE — Assessment & Plan Note (Signed)
Recent new drops of sugar despite endo lowering insulin.  follow closely.  Will look for possible causes contributing to new hypoglycemia.

## 2018-11-30 NOTE — Assessment & Plan Note (Signed)
Not clearly infected wound but difficult to assess.. pt will come in for wbc check. Start applying Duoderm. If not improving consider antibiotics and referral to wound care center.  Hospice available but restricting home visits given [place]. When able to do in home assessment.. I would recommend. Daughter in law will contact hospice.

## 2018-11-30 NOTE — Assessment & Plan Note (Signed)
Likely contributing to peripheral swelling given pt is inactive.

## 2018-12-02 DIAGNOSIS — D631 Anemia in chronic kidney disease: Secondary | ICD-10-CM | POA: Diagnosis not present

## 2018-12-02 DIAGNOSIS — I509 Heart failure, unspecified: Secondary | ICD-10-CM | POA: Diagnosis not present

## 2018-12-02 DIAGNOSIS — I213 ST elevation (STEMI) myocardial infarction of unspecified site: Secondary | ICD-10-CM | POA: Diagnosis not present

## 2018-12-02 DIAGNOSIS — I130 Hypertensive heart and chronic kidney disease with heart failure and stage 1 through stage 4 chronic kidney disease, or unspecified chronic kidney disease: Secondary | ICD-10-CM | POA: Diagnosis not present

## 2018-12-02 DIAGNOSIS — E1122 Type 2 diabetes mellitus with diabetic chronic kidney disease: Secondary | ICD-10-CM | POA: Diagnosis not present

## 2018-12-02 DIAGNOSIS — N184 Chronic kidney disease, stage 4 (severe): Secondary | ICD-10-CM | POA: Diagnosis not present

## 2018-12-03 DIAGNOSIS — G894 Chronic pain syndrome: Secondary | ICD-10-CM

## 2018-12-04 MED ORDER — HYDROCODONE-ACETAMINOPHEN 7.5-325 MG PO TABS: 1.0000 | ORAL_TABLET | Freq: Four times a day (QID) | ORAL | 0 refills | Status: DC | PRN

## 2018-12-04 NOTE — Telephone Encounter (Signed)
Izora Gala nurse with [facility] wanted to ck on status of hydrocodone apap7.5-325 mg. Izora Gala said pt does take the hydrocodone apap daily now and request a qty for one month if possible. [facility]. Any questions call Izora Gala.

## 2018-12-04 NOTE — Telephone Encounter (Signed)
Last visit 11/27/2018 for follow up.  Last refilled 10/05/2018 for #10 by Dr. Ree Kida.

## 2018-12-04 NOTE — Telephone Encounter (Signed)
I will refill this as usual, but please first  verify pain meds she is taking.Marland KitchenShe was using roxanol I believe, from hospital or hospice ( Dr. Ree Kida)? Was this temporary.. has she stopped this?

## 2018-12-13 ENCOUNTER — Encounter: Payer: Self-pay | Admitting: Family Medicine

## 2018-12-13 ENCOUNTER — Ambulatory Visit (INDEPENDENT_AMBULATORY_CARE_PROVIDER_SITE_OTHER): Payer: Medicare Other | Admitting: Family Medicine

## 2018-12-13 VITALS — BP 166/76 | HR 69 | Ht 63.0 in

## 2018-12-13 DIAGNOSIS — G894 Chronic pain syndrome: Secondary | ICD-10-CM | POA: Diagnosis not present

## 2018-12-13 DIAGNOSIS — E1143 Type 2 diabetes mellitus with diabetic autonomic (poly)neuropathy: Secondary | ICD-10-CM | POA: Diagnosis not present

## 2018-12-13 DIAGNOSIS — L97421 Non-pressure chronic ulcer of left heel and midfoot limited to breakdown of skin: Secondary | ICD-10-CM

## 2018-12-13 DIAGNOSIS — E1340 Other specified diabetes mellitus with diabetic neuropathy, unspecified: Secondary | ICD-10-CM

## 2018-12-13 DIAGNOSIS — F1120 Opioid dependence, uncomplicated: Secondary | ICD-10-CM | POA: Insufficient documentation

## 2018-12-13 DIAGNOSIS — E1165 Type 2 diabetes mellitus with hyperglycemia: Secondary | ICD-10-CM

## 2018-12-13 NOTE — Assessment & Plan Note (Signed)
Improved control on lower doses of insulin.

## 2018-12-13 NOTE — Assessment & Plan Note (Signed)
Chronic pain management:  Toe pain from neuropathy may better with vitamins.  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  Roxanol prn.. from hospices # pills per month: 120 Last UDS date:04/03/2018 Opioid Treatment Agreement signed (Y/N):04/03/2018 Opioid Treatment Agreement last reviewed with patient:  Y NCCSRS reviewed this encounter (include red flags):  Y no red flags

## 2018-12-13 NOTE — Assessment & Plan Note (Signed)
Now with pustule, 8/10 pain in heel and mild surrounding erythema: concerning for infeciton.  Tereat with clindamycin x 10 days.  Offered wound care referral now as pt has DM and high risk for severe complications from extremity infection.Marland Kitchen opt refuses at this time and requests trial of antibiotics first.  If not improving in 48-72 hours.. family will call to set up referral to wound care center.. or in office visit for assessment if there is a delay in wound care in person visit.  Start zinc paste as directed by hospice.   Cal use roxanol syrup for breakthrough pain as needed at bedtime if severe.

## 2018-12-13 NOTE — Progress Notes (Signed)
VIRTUAL VISIT Due to national recommendations of social distancing due to [place] 19, a virtual visit is felt to be most appropriate for this patient at this time.   I connected with the patient on 12/13/18 at  2:00 PM EDT by virtual telehealth platform and verified that I am speaking with the correct person using two identifiers.   I discussed the limitations, risks, security and privacy concerns of performing an evaluation and management service by  virtual telehealth platform and the availability of in person appointments. I also discussed with the patient that there may be a patient responsible charge related to this service. The patient expressed understanding and agreed to proceed.  Patient location: Home Provider Location: [facility] Participants: Eliezer Lofts and [name]   Chief Complaint  Patient presents with  . Follow-up    History of Present Illness: 85 year old female presents for 2 week follow up of ulcer on left heel and midfoot. She is being followed by hospice RN. Started duoderm... but staying in place. Given cream from hospice.Marland Kitchen zinc oxide and vaseline.  Pain is significant in left foot. Keeping her up at night. No increase in redness... but heel red and pustule  Legs not hot.  Swelling is much improved Right heel is dry and scabbed.   treated for cellultitis over 1 months ago.. now resolved.   Hypoglycemia... in setting of brittle diabetes followed by ENDO: FBS 90-120.. improved now on 8-12 units and 20 Units of lantus at bedtime  only one number > 200 in last week.Marland Kitchen after meals < 160.  Chronic pain management:  Toe pain from neuropathy may better with vitamins.  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  Roxanol prn.. from hospices # pills per month: 120 Last UDS date:04/03/2018 Opioid Treatment Agreement signed (Y/N):04/03/2018 Opioid  Treatment Agreement last reviewed with patient:  Y NCCSRS reviewed this encounter (include red flags):  Y no red flags  Hypertension:Tolerabel control on current regimen    Using medication without problems or lightheadedness: none Chest pain with exertion:none Edema:improved Short of breath:none Average home BPs: Other issues:   BP Readings from Last 3 Encounters:  12/13/18 (!) 166/76  10/16/18 110/60  10/08/18 (!) 136/54     COVID 19 screen No recent travel or known exposure to [place] The patient denies respiratory symptoms of COVID 19 at this time.  The importance of social distancing was discussed today.   Review of Systems  Constitutional: Negative for chills and fever.  HENT: Negative for congestion and ear pain.   Eyes: Negative for pain and redness.  Respiratory: Negative for cough and shortness of breath.   Cardiovascular: Negative for chest pain, palpitations and leg swelling.  Gastrointestinal: Negative for abdominal pain, blood in stool, constipation, diarrhea, nausea and vomiting.  Genitourinary: Negative for dysuria.  Musculoskeletal: Negative for falls and myalgias.  Skin: Negative for rash.  Neurological: Negative for dizziness.  Psychiatric/Behavioral: Negative for depression. The patient is not nervous/anxious.       Past Medical History:  Diagnosis Date  . A-fib ([place])   . Abnormal involuntary movements(781.0)   . Anemia   . Anxiety   . Anxiety state, unspecified   . Arthritis    body, neck  . Benign hypertensive heart disease   . Benign hypertensive heart disease with congestive heart failure ([place])   . Bladder disorder    over active  .  Cancer ([place])    skin, removed  . Cervicalgia   . CHF (congestive heart failure) ([place])   . Chronic kidney disease, stage III (moderate) (HCC)   . Congestive heart failure, unspecified   . Contact dermatitis and other eczema, due to unspecified cause   . Corns and callosities   . Diabetes mellitus without  complication ([place])   . Diarrhea   . Dyspnea 02/18/2015  . Dysuria 10/01/2018  . Edema   . Esophageal stricture   . Gastroparesis   . Gastroparesis   . GERD (gastroesophageal reflux disease)   . Hiatal hernia   . Hyperlipidemia   . Inflamed seborrheic keratosis   . Inflammatory disease of breast   . Irregular heartbeat   . Lumbago   . Mixed incontinence urge and stress (female)(female)   . Nausea alone   . Nonspecific (abnormal) findings on radiological and other examination of skull and head   . Osteoarthrosis, unspecified whether generalized or localized, unspecified site   . Other and unspecified hyperlipidemia   . Other B-complex deficiencies   . Other functional disorder of bladder   . Other malaise and fatigue   . Other specified visual disturbances   . Pain in joint, lower leg   . Palpitations   . Peripheral vascular disease, unspecified ([place])   . Postmenopausal atrophic vaginitis   . Reflux esophagitis   . Seborrheic keratosis   . Sleep apnea   . Spasm of muscle   . Spinal stenosis   . Spinal stenosis, unspecified region other than cervical   . Stricture and stenosis of esophagus   . Stroke ([place])   . TIA (transient ischemic attack) 2012  . Transient ischemic attack (TIA), and cerebral infarction without residual deficits(V12.54)   . Type II or unspecified type diabetes mellitus with renal manifestations, not stated as uncontrolled(250.40)   . Unspecified constipation   . Unspecified essential hypertension   . Unspecified hereditary and idiopathic peripheral neuropathy   . Unspecified hypothyroidism   . Unspecified pruritic disorder   . Unspecified sleep apnea   . Unspecified vitamin D deficiency   . Urinary tract infection, site not specified   . Vitamin B12 deficiency     reports that she has never smoked. She has never used smokeless tobacco. She reports that she does not drink alcohol or use drugs.   Current Outpatient Medications:  .  Alpha-Lipoic Acid 600 MG  TABS, Take 1 tablet by mouth 2 (two) times a day., Disp: , Rfl:  .  ALPRAZolam (XANAX) 0.5 MG tablet, TAKE 1 TABLET (0.5 MG TOTAL) BY MOUTH 3 (THREE) TIMES DAILY AS NEEDED., Disp: 90 tablet, Rfl: 0 .  amLODipine (NORVASC) 2.5 MG tablet, Take 1 tablet (2.5 mg total) by mouth daily., Disp: 90 tablet, Rfl: 1 .  aspirin EC 81 MG EC tablet, Take 1 tablet (81 mg total) by mouth daily., Disp: , Rfl:  .  atorvastatin (LIPITOR) 10 MG tablet, Take 1 tablet (10 mg total) by mouth daily at 6 PM., Disp: 90 tablet, Rfl: 1 .  b complex vitamins tablet, Take 1 tablet by mouth daily., Disp: , Rfl:  .  Cholecalciferol (VITAMIN D3) 1.25 MG (50000 UT) TABS, Take 1 tablet by mouth. every Monday., Disp: , Rfl:  .  CRANBERRY PO, Take 300 mg by mouth daily., Disp: , Rfl:  .  [facility] NATURAL LUTEIN EYE HEALTH PO, Take 1 tablet by mouth daily. , Disp: , Rfl:  .  diclofenac sodium (VOLTAREN) 1 % GEL,  Apply 4 g topically 4 (four) times daily. To bilateral knees, Disp: 100 g, Rfl: 5 .  furosemide (LASIX) 40 MG tablet, Take 40 mg by mouth daily., Disp: , Rfl:  .  gabapentin (NEURONTIN) 100 MG capsule, TAKE TWO CAPSULES BY MOUTH AT BEDTIME TO HELP WITH TREMORS, Disp: , Rfl:  .  glucagon (GLUCAGON EMERGENCY) 1 MG injection, Inject 1 mg into the muscle once as needed for up to 1 dose., Disp: 1 each, Rfl: 12 .  HYDROcodone-acetaminophen (NORCO) 7.5-325 MG tablet, Take 1 tablet by mouth every 6 (six) hours as needed for moderate pain., Disp: 120 tablet, Rfl: 0 .  HYDROcodone-acetaminophen (NORCO) 7.5-325 MG tablet, Take 1 tablet by mouth every 6 (six) hours as needed for moderate pain., Disp: 120 tablet, Rfl: 0 .  HYDROcodone-acetaminophen (NORCO) 7.5-325 MG tablet, Take 1 tablet by mouth every 6 (six) hours as needed for moderate pain., Disp: 120 tablet, Rfl: 0 .  Insulin Glargine (LANTUS SOLOSTAR) 100 UNIT/ML Solostar Pen, Inject 28 Units into the skin at bedtime., Disp: , Rfl:  .  insulin lispro (HUMALOG KWIKPEN) 100 UNIT/ML KwikPen,  Inject 10 units at breakfast, 16 units at lunch and 16 units at dinner, Disp: , Rfl:  .  Insulin Pen Needle (B-D ULTRAFINE III SHORT PEN) 31G X 8 MM MISC, Use with administering insulin. Dx. E11.29, Disp: 100 each, Rfl: 11 .  isosorbide mononitrate (IMDUR) 30 MG 24 hr tablet, Take 1 tablet (30 mg total) by mouth daily., Disp: 90 tablet, Rfl: 1 .  levothyroxine (SYNTHROID) 125 MCG tablet, Take 125 mcg by mouth daily before breakfast., Disp: , Rfl:  .  metoprolol tartrate (LOPRESSOR) 25 MG tablet, Take 0.5 tablets (12.5 mg total) by mouth 2 (two) times daily., Disp: 90 tablet, Rfl: 1 .  Morphine Sulfate (MORPHINE CONCENTRATE) 10 MG/0.5ML SOLN concentrated solution, Place 0.5 mLs (10 mg total) under the tongue every 4 (four) hours as needed for severe pain (Chest pain or anginal equivalent)., Disp: 15 mL, Rfl: 0 .  Multiple Vitamins-Minerals (PRESERVISION AREDS 2 PO), Take 1 tablet by mouth 2 (two) times daily. , Disp: , Rfl:  .  nitroGLYCERIN (NITROSTAT) 0.4 MG SL tablet, Place 1 tablet (0.4 mg total) under the tongue every 5 (five) minutes as needed for chest pain., Disp: 25 tablet, Rfl: 12 .  omeprazole (PRILOSEC) 40 MG capsule, TAKE ONE (1) CAPSULE BY MOUTH 2 TIMES DAILY FOR STOMACH, Disp: 180 capsule, Rfl: 1 .  ondansetron (ZOFRAN) 4 MG tablet, TAKE ONE TABLET BY MOUTH EVERY 4 HOURS AS NEEDED FOR NAUSEA/VOMITING, Disp: 30 tablet, Rfl: 0 .  ONE TOUCH ULTRA TEST test strip, Use to test sugar three times daily dx E11.29, Disp: 100 each, Rfl: 6 .  Polyethyl Glycol-Propyl Glycol (SYSTANE OP), Place 1 drop into both eyes 2 (two) times daily. , Disp: , Rfl:  .  senna (SENOKOT) 8.6 MG TABS tablet, Take 1 tablet by mouth daily as needed for mild constipation., Disp: , Rfl:  .  triamcinolone cream (KENALOG) 0.1 %, Apply 1 application topically as needed. Mix with CeraVe cream apply twice daily for dry skin, Disp: , Rfl:  .  trimethoprim (TRIMPEX) 100 MG tablet, TAKE ONE TABLET BY MOUTH EVERY NIGHT AT BEDTIME  TO PREVENT BLADDER INFECTION., Disp: 30 tablet, Rfl: 11 .  VITAMIN E PO, Take 400 Units by mouth daily., Disp: , Rfl:  .  gabapentin (NEURONTIN) 300 MG capsule, Take 1 capsule (300 mg total) by mouth at bedtime. (Patient not taking: Reported on  12/13/2018), Disp: 90 capsule, Rfl: 0   Observations/Objective: Blood pressure (!) 166/76, pulse 69, height 5\' 3"  (1.6 m).  Physical Exam  Physical Exam Constitutional:      General: The patient is not in acute distress. Pulmonary:     Effort: Pulmonary effort is normal. No respiratory distress.  Neurological:     Mental Status: The patient is alert and oriented to person, place, and time.  Psychiatric:        Mood and Affect: Mood normal.        Behavior: Behavior normal. Skin:  Right fott no skin breakdown.  left heel approximately 1 cm pustule and slight surrounding erythema approximately 5-7 cm diameter  Minimal swelling in bialteral legs... no lower extremity erythema in ankles Assessment and Plan Neuropathy due to secondary diabetes ([place]) Pain from this appears to be improving with vitamins.   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  Ulcer of left heel and midfoot, limited to breakdown of skin ([place]) Now with pustule, 8/10 pain in heel and mild surrounding erythema: concerning for infeciton.  Tereat with clindamycin x 10 days.  Offered wound care referral now as pt has DM and high risk for severe complications from extremity infection.Marland Kitchen opt refuses at this time and requests trial of antibiotics first.  If not improving in 48-72 hours.. family will call to set up referral to wound care center.. or in office visit for assessment if there is a delay in wound care in person visit.  Start zinc paste as directed by hospice.   Cal use roxanol syrup for breakthrough pain as needed at bedtime if severe.   Chronic pain syndrome Chronic pain management:  Toe pain  from neuropathy may better with vitamins.  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  Roxanol prn.. from hospices # pills per month: 120 Last UDS date:04/03/2018 Opioid Treatment Agreement signed (Y/N):04/03/2018 Opioid Treatment Agreement last reviewed with patient:  Y NCCSRS reviewed this encounter (include red flags):  Y no red flags  Refilled medication .Marland Kitchen needs Chronic pain management visit in 3 months    I discussed the assessment and treatment plan with the patient. The patient was provided an opportunity to ask questions and all were answered. The patient agreed with the plan and demonstrated an understanding of the instructions.   The patient was advised to call back or seek an in-person evaluation if the symptoms worsen or if the condition fails to improve as anticipated.     Eliezer Lofts, MD

## 2018-12-13 NOTE — Assessment & Plan Note (Signed)
Pain from this appears to be improving with vitamins.

## 2018-12-14 ENCOUNTER — Telehealth: Payer: Self-pay | Admitting: Family Medicine

## 2018-12-14 NOTE — Telephone Encounter (Signed)
On call provider contacted.  Pt's son states pt was seen 5/14 for foot wound.  Son states pt was advised to use clindamycin ointment on area, but the rx has not been received by the pharmacy.  Wound does not appear worse.  Per office note clindamycin x 10 days.  After hours nurse called rx in to pt's pharmacy.  Family advised to f/u with pcp on Monday prn.  Karen Mitts, MD 7:43 pm  12/14/18

## 2018-12-15 DIAGNOSIS — G894 Chronic pain syndrome: Secondary | ICD-10-CM | POA: Diagnosis not present

## 2018-12-15 DIAGNOSIS — M179 Osteoarthritis of knee, unspecified: Secondary | ICD-10-CM | POA: Diagnosis not present

## 2018-12-15 DIAGNOSIS — K219 Gastro-esophageal reflux disease without esophagitis: Secondary | ICD-10-CM | POA: Diagnosis not present

## 2018-12-15 DIAGNOSIS — R2689 Other abnormalities of gait and mobility: Secondary | ICD-10-CM | POA: Diagnosis not present

## 2018-12-17 ENCOUNTER — Telehealth: Payer: Self-pay

## 2018-12-17 NOTE — Telephone Encounter (Signed)
[place] Night - Client >>>Contains Verbal Order - Signature Required<<< [place] Patient Name: Karen [name] Gender: Female DOB: Dec 14, 1923 Age: 85 Y 76 M 3 D Return Phone Number: [phone] (Primary), [phone] (Secondary) Address: City/State/Zip: [place] [address] Client [facility] Night - Client Client Site [place] Physician Eliezer Lofts - MD Contact Type Call Who Is Calling Patient / Member / Family / Caregiver Call Type Triage / Clinical Caller Name [name] Relationship To Patient Son Return Phone Number [phone] (Secondary) Chief Complaint Sores Reason for Call Symptomatic / Request for [place] states his mother had a virtual appointment yesterday and she was sent a prescription but the pharmacy does not have it. States it is a medication for a wound on her heel from her shoes. Translation No Nurse Assessment Guidelines Guideline Title Affirmed Question Affirmed Notes Nurse Date/Time (Eastern Time) Disp. Time Eilene Ghazi Time) Disposition Final User 12/14/2018 7:35:42 PM Called On-Call Provider [place], RN, Sherlie Ban 12/14/2018 7:48:19 PM Pharmacy Call Renne Crigler, RN, Sherlie Ban Reason: [facility] [phone]). Called in V.O. of Clindamycin Ointment. 12/14/2018 7:48:29 PM Clinical Call Yes [place], RN, Sherlie Ban Verbal Orders/Maintenance Medications Medication Refill Route Dosage Regime Duration Admin Instructions User Name Clindamycin Ointment Topical 10 Days Apply twice daily to heel wound. No refills. Renne Crigler, RN, Sherlie Ban Comments User: Linus Orn, RN Date/Time Eilene Ghazi Time): 12/14/2018 7:47:25 PM Informed son that [facility] Stated that on-call emphasized to keep F/U appt on Monday. Son verbalized understanding. PLEASE NOTE: All timestamps contained within this  report are represented as [place] Standard Time. CONFIDENTIALTY NOTICE: This fax transmission is intended only for the addressee. It contains information that is legally privileged, confidential or otherwise protected from use or disclosure. If you are not the intended recipient, you are strictly prohibited from reviewing, disclosing, copying using or disseminating any of this information or taking any action in reliance on or regarding this information. If you have received this fax in error, please notify us immediately by telephone so that we can arrange for its return to [place]. Phone: [phone], Toll-Free: [phone], Fax: [phone] Page: 2 of 3 Call Id: [id number] Paging DoctorName Phone DateTime Result/Outcome Message Type Notes Grier Mitts- MD [phone] 12/14/2018 7:35:42 PM Called On Call Provider - Reached Doctor Paged Grier Mitts- MD 12/14/2018 7:40:46 PM Spoke with On Call - General Message Result On-Call gave verbal order of Clindamycin Ointment x10 days. Apply twice daily. On-call emphasized to keep F/U on Monday to let PCP know how wound is healing.

## 2018-12-17 NOTE — Telephone Encounter (Signed)
Please refer to phone encounter from Dr. Volanda Napoleon who addressed this issue over weekend.   Spoke with patient, she states that the pain was worse last night to her heel but it is better today. She denies any increase in redness and no open, draining areas and denies any odor or worsening symptoms.    She continues on her abx and has an appointment on Wednesday for her knee injection but has no questions or concerns at this time.  Patient is aware to call us and let us know if area is not healing or if it is getting worse at any point.   FYI to Dr. Diona Browner.

## 2018-12-18 ENCOUNTER — Telehealth: Payer: Self-pay

## 2018-12-18 NOTE — Progress Notes (Signed)
Appointment 8/4 dodd aware

## 2018-12-18 NOTE — Telephone Encounter (Signed)
Patient may need to take more Humalog.  Per my records, she is taking 05-17-15 units before meals.  She may need to increase each of these doses by 4 units.  Please let us know if the sugars stay high afterwards.

## 2018-12-18 NOTE — Telephone Encounter (Signed)
Notified Izora Gala of the instructions from Dr. Cruzita Lederer.

## 2018-12-18 NOTE — Telephone Encounter (Signed)
Karen [name] with Hospice has called because patient is getting a steroid injection tomorrow for her arthritic knee and her family is concerned how to handle the spikes in blood sugar she may have as a result.  Please advise.

## 2018-12-19 DIAGNOSIS — M170 Bilateral primary osteoarthritis of knee: Secondary | ICD-10-CM | POA: Diagnosis not present

## 2018-12-25 ENCOUNTER — Other Ambulatory Visit: Payer: Self-pay | Admitting: Family Medicine

## 2018-12-25 ENCOUNTER — Telehealth: Payer: Self-pay

## 2018-12-25 DIAGNOSIS — K219 Gastro-esophageal reflux disease without esophagitis: Secondary | ICD-10-CM

## 2018-12-25 NOTE — Telephone Encounter (Signed)
Last office visit 12/13/2018 for Ulcer of left heel.  Last refilled 10/09/2018 for #90 with no refills.  UDS/Contract 04/03/2018.   Next Appt: 03/05/2019 for 3 month follow up.

## 2018-12-25 NOTE — Telephone Encounter (Signed)
Karen [name] with [facility] hosp[facility] left v/m requesting refill for hydrocodone apap 7.5-325 mg. Left v/m on Karen [name] v/m that pt received hydrocodone apap 7.5-325mg  # 120 x 2 on 12/04/18 [facility]. Pt should have available refills. See pt message on 12/03/18.

## 2018-12-25 NOTE — Telephone Encounter (Signed)
Karen [name] returned your call she stated she would check with daughter and call you back if she needs to

## 2018-12-26 ENCOUNTER — Telehealth: Payer: Self-pay | Admitting: *Deleted

## 2018-12-26 DIAGNOSIS — E1122 Type 2 diabetes mellitus with diabetic chronic kidney disease: Secondary | ICD-10-CM | POA: Diagnosis not present

## 2018-12-26 DIAGNOSIS — I509 Heart failure, unspecified: Secondary | ICD-10-CM | POA: Diagnosis not present

## 2018-12-26 DIAGNOSIS — I130 Hypertensive heart and chronic kidney disease with heart failure and stage 1 through stage 4 chronic kidney disease, or unspecified chronic kidney disease: Secondary | ICD-10-CM | POA: Diagnosis not present

## 2018-12-26 DIAGNOSIS — I213 ST elevation (STEMI) myocardial infarction of unspecified site: Secondary | ICD-10-CM | POA: Diagnosis not present

## 2018-12-26 DIAGNOSIS — D631 Anemia in chronic kidney disease: Secondary | ICD-10-CM | POA: Diagnosis not present

## 2018-12-26 DIAGNOSIS — N184 Chronic kidney disease, stage 4 (severe): Secondary | ICD-10-CM | POA: Diagnosis not present

## 2018-12-26 NOTE — Telephone Encounter (Signed)
Izora Gala the nurse with Hospice left a voicemail stating that patient is due for re-certification for their care. Izora Gala stated that she needs a verbal order to continue their services for her.Marland Kitchen

## 2018-12-27 DIAGNOSIS — I509 Heart failure, unspecified: Secondary | ICD-10-CM | POA: Diagnosis not present

## 2018-12-27 DIAGNOSIS — N184 Chronic kidney disease, stage 4 (severe): Secondary | ICD-10-CM | POA: Diagnosis not present

## 2018-12-27 DIAGNOSIS — D631 Anemia in chronic kidney disease: Secondary | ICD-10-CM | POA: Diagnosis not present

## 2018-12-27 DIAGNOSIS — E1122 Type 2 diabetes mellitus with diabetic chronic kidney disease: Secondary | ICD-10-CM | POA: Diagnosis not present

## 2018-12-27 DIAGNOSIS — I213 ST elevation (STEMI) myocardial infarction of unspecified site: Secondary | ICD-10-CM | POA: Diagnosis not present

## 2018-12-27 DIAGNOSIS — I130 Hypertensive heart and chronic kidney disease with heart failure and stage 1 through stage 4 chronic kidney disease, or unspecified chronic kidney disease: Secondary | ICD-10-CM | POA: Diagnosis not present

## 2018-12-27 NOTE — Telephone Encounter (Signed)
Okay to give verbal order.

## 2018-12-27 NOTE — Telephone Encounter (Signed)
Left message for Izora Gala giving verbal order for re-certification for hospice care.

## 2018-12-31 DIAGNOSIS — I509 Heart failure, unspecified: Secondary | ICD-10-CM | POA: Diagnosis not present

## 2018-12-31 DIAGNOSIS — I130 Hypertensive heart and chronic kidney disease with heart failure and stage 1 through stage 4 chronic kidney disease, or unspecified chronic kidney disease: Secondary | ICD-10-CM | POA: Diagnosis not present

## 2018-12-31 DIAGNOSIS — E1122 Type 2 diabetes mellitus with diabetic chronic kidney disease: Secondary | ICD-10-CM | POA: Diagnosis not present

## 2018-12-31 DIAGNOSIS — D631 Anemia in chronic kidney disease: Secondary | ICD-10-CM | POA: Diagnosis not present

## 2018-12-31 DIAGNOSIS — N184 Chronic kidney disease, stage 4 (severe): Secondary | ICD-10-CM | POA: Diagnosis not present

## 2018-12-31 DIAGNOSIS — I213 ST elevation (STEMI) myocardial infarction of unspecified site: Secondary | ICD-10-CM | POA: Diagnosis not present

## 2019-01-01 DIAGNOSIS — I509 Heart failure, unspecified: Secondary | ICD-10-CM | POA: Diagnosis not present

## 2019-01-01 DIAGNOSIS — I213 ST elevation (STEMI) myocardial infarction of unspecified site: Secondary | ICD-10-CM | POA: Diagnosis not present

## 2019-01-01 DIAGNOSIS — E1122 Type 2 diabetes mellitus with diabetic chronic kidney disease: Secondary | ICD-10-CM | POA: Diagnosis not present

## 2019-01-01 DIAGNOSIS — N184 Chronic kidney disease, stage 4 (severe): Secondary | ICD-10-CM | POA: Diagnosis not present

## 2019-01-01 DIAGNOSIS — I130 Hypertensive heart and chronic kidney disease with heart failure and stage 1 through stage 4 chronic kidney disease, or unspecified chronic kidney disease: Secondary | ICD-10-CM | POA: Diagnosis not present

## 2019-01-01 DIAGNOSIS — D631 Anemia in chronic kidney disease: Secondary | ICD-10-CM | POA: Diagnosis not present

## 2019-01-02 DIAGNOSIS — I130 Hypertensive heart and chronic kidney disease with heart failure and stage 1 through stage 4 chronic kidney disease, or unspecified chronic kidney disease: Secondary | ICD-10-CM | POA: Diagnosis not present

## 2019-01-02 DIAGNOSIS — E1122 Type 2 diabetes mellitus with diabetic chronic kidney disease: Secondary | ICD-10-CM | POA: Diagnosis not present

## 2019-01-02 DIAGNOSIS — N184 Chronic kidney disease, stage 4 (severe): Secondary | ICD-10-CM | POA: Diagnosis not present

## 2019-01-02 DIAGNOSIS — I213 ST elevation (STEMI) myocardial infarction of unspecified site: Secondary | ICD-10-CM | POA: Diagnosis not present

## 2019-01-02 DIAGNOSIS — D631 Anemia in chronic kidney disease: Secondary | ICD-10-CM | POA: Diagnosis not present

## 2019-01-02 DIAGNOSIS — I509 Heart failure, unspecified: Secondary | ICD-10-CM | POA: Diagnosis not present

## 2019-01-03 DIAGNOSIS — I213 ST elevation (STEMI) myocardial infarction of unspecified site: Secondary | ICD-10-CM | POA: Diagnosis not present

## 2019-01-03 DIAGNOSIS — I130 Hypertensive heart and chronic kidney disease with heart failure and stage 1 through stage 4 chronic kidney disease, or unspecified chronic kidney disease: Secondary | ICD-10-CM | POA: Diagnosis not present

## 2019-01-03 DIAGNOSIS — D631 Anemia in chronic kidney disease: Secondary | ICD-10-CM | POA: Diagnosis not present

## 2019-01-03 DIAGNOSIS — N184 Chronic kidney disease, stage 4 (severe): Secondary | ICD-10-CM | POA: Diagnosis not present

## 2019-01-03 DIAGNOSIS — E1122 Type 2 diabetes mellitus with diabetic chronic kidney disease: Secondary | ICD-10-CM | POA: Diagnosis not present

## 2019-01-03 DIAGNOSIS — I509 Heart failure, unspecified: Secondary | ICD-10-CM | POA: Diagnosis not present

## 2019-01-07 ENCOUNTER — Other Ambulatory Visit: Payer: Self-pay | Admitting: *Deleted

## 2019-01-07 DIAGNOSIS — I509 Heart failure, unspecified: Secondary | ICD-10-CM | POA: Diagnosis not present

## 2019-01-07 DIAGNOSIS — N184 Chronic kidney disease, stage 4 (severe): Secondary | ICD-10-CM | POA: Diagnosis not present

## 2019-01-07 DIAGNOSIS — E1122 Type 2 diabetes mellitus with diabetic chronic kidney disease: Secondary | ICD-10-CM | POA: Diagnosis not present

## 2019-01-07 DIAGNOSIS — I213 ST elevation (STEMI) myocardial infarction of unspecified site: Secondary | ICD-10-CM | POA: Diagnosis not present

## 2019-01-07 DIAGNOSIS — G894 Chronic pain syndrome: Secondary | ICD-10-CM

## 2019-01-07 DIAGNOSIS — I130 Hypertensive heart and chronic kidney disease with heart failure and stage 1 through stage 4 chronic kidney disease, or unspecified chronic kidney disease: Secondary | ICD-10-CM | POA: Diagnosis not present

## 2019-01-07 DIAGNOSIS — D631 Anemia in chronic kidney disease: Secondary | ICD-10-CM | POA: Diagnosis not present

## 2019-01-07 NOTE — Telephone Encounter (Signed)
[facility] 7.5-325 mg.  Spoke with [place] at [place]. She states the caregiver advised them that this medication needed to billed under Hospice.  Hospice will only cover #60 tablets for a 15 day supply.  Therefore they lose the other 60 tablets from the original #120 prescription.  I also think you have to write Hospice Patient on Rx.

## 2019-01-08 ENCOUNTER — Other Ambulatory Visit: Payer: Self-pay | Admitting: Internal Medicine

## 2019-01-08 MED ORDER — HYDROCODONE-ACETAMINOPHEN 7.5-325 MG PO TABS: 1.0000 | ORAL_TABLET | Freq: Four times a day (QID) | ORAL | 0 refills | Status: DC | PRN

## 2019-01-14 DIAGNOSIS — I213 ST elevation (STEMI) myocardial infarction of unspecified site: Secondary | ICD-10-CM | POA: Diagnosis not present

## 2019-01-14 DIAGNOSIS — E1122 Type 2 diabetes mellitus with diabetic chronic kidney disease: Secondary | ICD-10-CM | POA: Diagnosis not present

## 2019-01-14 DIAGNOSIS — I509 Heart failure, unspecified: Secondary | ICD-10-CM | POA: Diagnosis not present

## 2019-01-14 DIAGNOSIS — D631 Anemia in chronic kidney disease: Secondary | ICD-10-CM | POA: Diagnosis not present

## 2019-01-14 DIAGNOSIS — I130 Hypertensive heart and chronic kidney disease with heart failure and stage 1 through stage 4 chronic kidney disease, or unspecified chronic kidney disease: Secondary | ICD-10-CM | POA: Diagnosis not present

## 2019-01-14 DIAGNOSIS — N184 Chronic kidney disease, stage 4 (severe): Secondary | ICD-10-CM | POA: Diagnosis not present

## 2019-01-15 DIAGNOSIS — K219 Gastro-esophageal reflux disease without esophagitis: Secondary | ICD-10-CM | POA: Diagnosis not present

## 2019-01-15 DIAGNOSIS — G894 Chronic pain syndrome: Secondary | ICD-10-CM | POA: Diagnosis not present

## 2019-01-15 DIAGNOSIS — M179 Osteoarthritis of knee, unspecified: Secondary | ICD-10-CM | POA: Diagnosis not present

## 2019-01-15 DIAGNOSIS — R2689 Other abnormalities of gait and mobility: Secondary | ICD-10-CM | POA: Diagnosis not present

## 2019-01-16 ENCOUNTER — Other Ambulatory Visit: Payer: Self-pay | Admitting: *Deleted

## 2019-01-16 DIAGNOSIS — I213 ST elevation (STEMI) myocardial infarction of unspecified site: Secondary | ICD-10-CM | POA: Diagnosis not present

## 2019-01-16 DIAGNOSIS — D631 Anemia in chronic kidney disease: Secondary | ICD-10-CM | POA: Diagnosis not present

## 2019-01-16 DIAGNOSIS — N184 Chronic kidney disease, stage 4 (severe): Secondary | ICD-10-CM | POA: Diagnosis not present

## 2019-01-16 DIAGNOSIS — I509 Heart failure, unspecified: Secondary | ICD-10-CM | POA: Diagnosis not present

## 2019-01-16 DIAGNOSIS — I130 Hypertensive heart and chronic kidney disease with heart failure and stage 1 through stage 4 chronic kidney disease, or unspecified chronic kidney disease: Secondary | ICD-10-CM | POA: Diagnosis not present

## 2019-01-16 DIAGNOSIS — E1122 Type 2 diabetes mellitus with diabetic chronic kidney disease: Secondary | ICD-10-CM | POA: Diagnosis not present

## 2019-01-16 NOTE — Telephone Encounter (Signed)
Last office visit 12/13/2018 for follow up.  Last refilled ?  Last Vit D level ?? (I can't find a vitamin D level in chart).  Refill?

## 2019-01-16 NOTE — Telephone Encounter (Signed)
Karen [name] with [facility]osp[facility] left a voicemail requesting a refill on patient's vitamin D 1.25 mg because the medication does not have any refills.

## 2019-01-17 MED ORDER — VITAMIN D3 1.25 MG (50000 UT) PO TABS: 12.0000 | ORAL_TABLET | ORAL | 3 refills | Status: DC

## 2019-01-17 MED ORDER — VITAMIN D3 1.25 MG (50000 UT) PO TABS: 1.0000 | ORAL_TABLET | ORAL | 3 refills | Status: AC

## 2019-01-17 NOTE — Addendum Note (Signed)
Addended by: Carter Kitten on: 01/17/2019 11:31 AM   Modules accepted: Orders

## 2019-01-21 DIAGNOSIS — I213 ST elevation (STEMI) myocardial infarction of unspecified site: Secondary | ICD-10-CM | POA: Diagnosis not present

## 2019-01-21 DIAGNOSIS — D631 Anemia in chronic kidney disease: Secondary | ICD-10-CM | POA: Diagnosis not present

## 2019-01-21 DIAGNOSIS — I130 Hypertensive heart and chronic kidney disease with heart failure and stage 1 through stage 4 chronic kidney disease, or unspecified chronic kidney disease: Secondary | ICD-10-CM | POA: Diagnosis not present

## 2019-01-21 DIAGNOSIS — N184 Chronic kidney disease, stage 4 (severe): Secondary | ICD-10-CM | POA: Diagnosis not present

## 2019-01-21 DIAGNOSIS — E1122 Type 2 diabetes mellitus with diabetic chronic kidney disease: Secondary | ICD-10-CM | POA: Diagnosis not present

## 2019-01-21 DIAGNOSIS — I509 Heart failure, unspecified: Secondary | ICD-10-CM | POA: Diagnosis not present

## 2019-01-24 NOTE — Telephone Encounter (Addendum)
Karen [name] with [facility] left v/m requesting 30 day supply of hydrocodone apap 7.5-325 mg. pts daughter in law said pharmacy out of way and Karen [name] said due to covid hospice will cover 30 day supply. I spoke with [place] at [facility] and advised [name]'s v/m.[place] said she had spoken with hospice and was advised hospice will pay for 30 day rx now. [place] said hydrocodone apap was picked up on 01/21/19 for 15 day supply. Please advise.  Name of Medication: hydrocodone apap 7.5-325 Name of [facility]acy[facility] Written Date and Quantity: # 48 on 01/21/19 Last Office Visit and Type: 12/13/18 FU Next Office Visit and Type: 03/05/19 for 3 mth FU Last Controlled Substance Agreement Date: 03/23/18 Last UDS:04/03/18

## 2019-01-24 NOTE — Addendum Note (Signed)
Addended by: Helene Shoe on: 01/24/2019 09:31 AM   Modules accepted: Orders

## 2019-01-28 DIAGNOSIS — I509 Heart failure, unspecified: Secondary | ICD-10-CM | POA: Diagnosis not present

## 2019-01-28 DIAGNOSIS — N184 Chronic kidney disease, stage 4 (severe): Secondary | ICD-10-CM | POA: Diagnosis not present

## 2019-01-28 DIAGNOSIS — E1122 Type 2 diabetes mellitus with diabetic chronic kidney disease: Secondary | ICD-10-CM | POA: Diagnosis not present

## 2019-01-28 DIAGNOSIS — D631 Anemia in chronic kidney disease: Secondary | ICD-10-CM | POA: Diagnosis not present

## 2019-01-28 DIAGNOSIS — I130 Hypertensive heart and chronic kidney disease with heart failure and stage 1 through stage 4 chronic kidney disease, or unspecified chronic kidney disease: Secondary | ICD-10-CM | POA: Diagnosis not present

## 2019-01-28 DIAGNOSIS — I213 ST elevation (STEMI) myocardial infarction of unspecified site: Secondary | ICD-10-CM | POA: Diagnosis not present

## 2019-01-28 MED ORDER — HYDROCODONE-ACETAMINOPHEN 7.5-325 MG PO TABS: 1.0000 | ORAL_TABLET | Freq: Four times a day (QID) | ORAL | 0 refills | Status: DC | PRN

## 2019-01-29 ENCOUNTER — Other Ambulatory Visit: Payer: Self-pay | Admitting: Family Medicine

## 2019-01-29 DIAGNOSIS — E1122 Type 2 diabetes mellitus with diabetic chronic kidney disease: Secondary | ICD-10-CM | POA: Diagnosis not present

## 2019-01-29 DIAGNOSIS — N184 Chronic kidney disease, stage 4 (severe): Secondary | ICD-10-CM | POA: Diagnosis not present

## 2019-01-29 DIAGNOSIS — I509 Heart failure, unspecified: Secondary | ICD-10-CM | POA: Diagnosis not present

## 2019-01-29 DIAGNOSIS — I213 ST elevation (STEMI) myocardial infarction of unspecified site: Secondary | ICD-10-CM | POA: Diagnosis not present

## 2019-01-29 DIAGNOSIS — I130 Hypertensive heart and chronic kidney disease with heart failure and stage 1 through stage 4 chronic kidney disease, or unspecified chronic kidney disease: Secondary | ICD-10-CM | POA: Diagnosis not present

## 2019-01-29 DIAGNOSIS — D631 Anemia in chronic kidney disease: Secondary | ICD-10-CM | POA: Diagnosis not present

## 2019-01-30 ENCOUNTER — Other Ambulatory Visit: Payer: Self-pay | Admitting: Internal Medicine

## 2019-01-30 DIAGNOSIS — E785 Hyperlipidemia, unspecified: Secondary | ICD-10-CM | POA: Diagnosis not present

## 2019-01-30 DIAGNOSIS — Z87440 Personal history of urinary (tract) infections: Secondary | ICD-10-CM | POA: Diagnosis not present

## 2019-01-30 DIAGNOSIS — I509 Heart failure, unspecified: Secondary | ICD-10-CM | POA: Diagnosis not present

## 2019-01-30 DIAGNOSIS — H3530 Unspecified macular degeneration: Secondary | ICD-10-CM | POA: Diagnosis not present

## 2019-01-30 DIAGNOSIS — E559 Vitamin D deficiency, unspecified: Secondary | ICD-10-CM | POA: Diagnosis not present

## 2019-01-30 DIAGNOSIS — K222 Esophageal obstruction: Secondary | ICD-10-CM | POA: Diagnosis not present

## 2019-01-30 DIAGNOSIS — H04129 Dry eye syndrome of unspecified lacrimal gland: Secondary | ICD-10-CM | POA: Diagnosis not present

## 2019-01-30 DIAGNOSIS — L03116 Cellulitis of left lower limb: Secondary | ICD-10-CM | POA: Diagnosis not present

## 2019-01-30 DIAGNOSIS — F419 Anxiety disorder, unspecified: Secondary | ICD-10-CM | POA: Diagnosis not present

## 2019-01-30 DIAGNOSIS — S91301D Unspecified open wound, right foot, subsequent encounter: Secondary | ICD-10-CM | POA: Diagnosis not present

## 2019-01-30 DIAGNOSIS — L03115 Cellulitis of right lower limb: Secondary | ICD-10-CM | POA: Diagnosis not present

## 2019-01-30 DIAGNOSIS — I213 ST elevation (STEMI) myocardial infarction of unspecified site: Secondary | ICD-10-CM | POA: Diagnosis not present

## 2019-01-30 DIAGNOSIS — I4891 Unspecified atrial fibrillation: Secondary | ICD-10-CM | POA: Diagnosis not present

## 2019-01-30 DIAGNOSIS — I69818 Other symptoms and signs involving cognitive functions following other cerebrovascular disease: Secondary | ICD-10-CM | POA: Diagnosis not present

## 2019-01-30 DIAGNOSIS — E1122 Type 2 diabetes mellitus with diabetic chronic kidney disease: Secondary | ICD-10-CM | POA: Diagnosis not present

## 2019-01-30 DIAGNOSIS — Z993 Dependence on wheelchair: Secondary | ICD-10-CM | POA: Diagnosis not present

## 2019-01-30 DIAGNOSIS — K219 Gastro-esophageal reflux disease without esophagitis: Secondary | ICD-10-CM | POA: Diagnosis not present

## 2019-01-30 DIAGNOSIS — S91302D Unspecified open wound, left foot, subsequent encounter: Secondary | ICD-10-CM | POA: Diagnosis not present

## 2019-01-30 DIAGNOSIS — N184 Chronic kidney disease, stage 4 (severe): Secondary | ICD-10-CM | POA: Diagnosis not present

## 2019-01-30 DIAGNOSIS — I130 Hypertensive heart and chronic kidney disease with heart failure and stage 1 through stage 4 chronic kidney disease, or unspecified chronic kidney disease: Secondary | ICD-10-CM | POA: Diagnosis not present

## 2019-01-30 DIAGNOSIS — I25119 Atherosclerotic heart disease of native coronary artery with unspecified angina pectoris: Secondary | ICD-10-CM | POA: Diagnosis not present

## 2019-01-30 DIAGNOSIS — I70209 Unspecified atherosclerosis of native arteries of extremities, unspecified extremity: Secondary | ICD-10-CM | POA: Diagnosis not present

## 2019-01-30 DIAGNOSIS — E1151 Type 2 diabetes mellitus with diabetic peripheral angiopathy without gangrene: Secondary | ICD-10-CM | POA: Diagnosis not present

## 2019-01-30 DIAGNOSIS — D631 Anemia in chronic kidney disease: Secondary | ICD-10-CM | POA: Diagnosis not present

## 2019-01-30 DIAGNOSIS — E039 Hypothyroidism, unspecified: Secondary | ICD-10-CM | POA: Diagnosis not present

## 2019-01-30 NOTE — Telephone Encounter (Signed)
Last office visit 12/13/2018 for Ulcer of left heel.  Last refilled 12/25/2018 for #90 with no refills.  Next Appt: 03/05/2019 for 3 month follow up.

## 2019-01-31 DIAGNOSIS — D631 Anemia in chronic kidney disease: Secondary | ICD-10-CM | POA: Diagnosis not present

## 2019-01-31 DIAGNOSIS — N184 Chronic kidney disease, stage 4 (severe): Secondary | ICD-10-CM | POA: Diagnosis not present

## 2019-01-31 DIAGNOSIS — I213 ST elevation (STEMI) myocardial infarction of unspecified site: Secondary | ICD-10-CM | POA: Diagnosis not present

## 2019-01-31 DIAGNOSIS — I130 Hypertensive heart and chronic kidney disease with heart failure and stage 1 through stage 4 chronic kidney disease, or unspecified chronic kidney disease: Secondary | ICD-10-CM | POA: Diagnosis not present

## 2019-01-31 DIAGNOSIS — I509 Heart failure, unspecified: Secondary | ICD-10-CM | POA: Diagnosis not present

## 2019-01-31 DIAGNOSIS — E1122 Type 2 diabetes mellitus with diabetic chronic kidney disease: Secondary | ICD-10-CM | POA: Diagnosis not present

## 2019-02-02 ENCOUNTER — Other Ambulatory Visit: Payer: Self-pay | Admitting: Internal Medicine

## 2019-02-04 DIAGNOSIS — E1122 Type 2 diabetes mellitus with diabetic chronic kidney disease: Secondary | ICD-10-CM | POA: Diagnosis not present

## 2019-02-04 DIAGNOSIS — I509 Heart failure, unspecified: Secondary | ICD-10-CM | POA: Diagnosis not present

## 2019-02-04 DIAGNOSIS — D631 Anemia in chronic kidney disease: Secondary | ICD-10-CM | POA: Diagnosis not present

## 2019-02-04 DIAGNOSIS — I213 ST elevation (STEMI) myocardial infarction of unspecified site: Secondary | ICD-10-CM | POA: Diagnosis not present

## 2019-02-04 DIAGNOSIS — I130 Hypertensive heart and chronic kidney disease with heart failure and stage 1 through stage 4 chronic kidney disease, or unspecified chronic kidney disease: Secondary | ICD-10-CM | POA: Diagnosis not present

## 2019-02-04 DIAGNOSIS — N184 Chronic kidney disease, stage 4 (severe): Secondary | ICD-10-CM | POA: Diagnosis not present

## 2019-02-06 DIAGNOSIS — N184 Chronic kidney disease, stage 4 (severe): Secondary | ICD-10-CM | POA: Diagnosis not present

## 2019-02-06 DIAGNOSIS — D631 Anemia in chronic kidney disease: Secondary | ICD-10-CM | POA: Diagnosis not present

## 2019-02-06 DIAGNOSIS — I130 Hypertensive heart and chronic kidney disease with heart failure and stage 1 through stage 4 chronic kidney disease, or unspecified chronic kidney disease: Secondary | ICD-10-CM | POA: Diagnosis not present

## 2019-02-06 DIAGNOSIS — I213 ST elevation (STEMI) myocardial infarction of unspecified site: Secondary | ICD-10-CM | POA: Diagnosis not present

## 2019-02-06 DIAGNOSIS — E1122 Type 2 diabetes mellitus with diabetic chronic kidney disease: Secondary | ICD-10-CM | POA: Diagnosis not present

## 2019-02-06 DIAGNOSIS — I509 Heart failure, unspecified: Secondary | ICD-10-CM | POA: Diagnosis not present

## 2019-02-07 DIAGNOSIS — D631 Anemia in chronic kidney disease: Secondary | ICD-10-CM | POA: Diagnosis not present

## 2019-02-07 DIAGNOSIS — I130 Hypertensive heart and chronic kidney disease with heart failure and stage 1 through stage 4 chronic kidney disease, or unspecified chronic kidney disease: Secondary | ICD-10-CM | POA: Diagnosis not present

## 2019-02-07 DIAGNOSIS — E1122 Type 2 diabetes mellitus with diabetic chronic kidney disease: Secondary | ICD-10-CM | POA: Diagnosis not present

## 2019-02-07 DIAGNOSIS — I509 Heart failure, unspecified: Secondary | ICD-10-CM | POA: Diagnosis not present

## 2019-02-07 DIAGNOSIS — N184 Chronic kidney disease, stage 4 (severe): Secondary | ICD-10-CM | POA: Diagnosis not present

## 2019-02-07 DIAGNOSIS — I213 ST elevation (STEMI) myocardial infarction of unspecified site: Secondary | ICD-10-CM | POA: Diagnosis not present

## 2019-02-07 NOTE — Progress Notes (Signed)
This encounter was created in error - please disregard.

## 2019-02-11 DIAGNOSIS — I213 ST elevation (STEMI) myocardial infarction of unspecified site: Secondary | ICD-10-CM | POA: Diagnosis not present

## 2019-02-11 DIAGNOSIS — I130 Hypertensive heart and chronic kidney disease with heart failure and stage 1 through stage 4 chronic kidney disease, or unspecified chronic kidney disease: Secondary | ICD-10-CM | POA: Diagnosis not present

## 2019-02-11 DIAGNOSIS — D631 Anemia in chronic kidney disease: Secondary | ICD-10-CM | POA: Diagnosis not present

## 2019-02-11 DIAGNOSIS — E1122 Type 2 diabetes mellitus with diabetic chronic kidney disease: Secondary | ICD-10-CM | POA: Diagnosis not present

## 2019-02-11 DIAGNOSIS — I509 Heart failure, unspecified: Secondary | ICD-10-CM | POA: Diagnosis not present

## 2019-02-11 DIAGNOSIS — N184 Chronic kidney disease, stage 4 (severe): Secondary | ICD-10-CM | POA: Diagnosis not present

## 2019-02-13 DIAGNOSIS — I130 Hypertensive heart and chronic kidney disease with heart failure and stage 1 through stage 4 chronic kidney disease, or unspecified chronic kidney disease: Secondary | ICD-10-CM | POA: Diagnosis not present

## 2019-02-13 DIAGNOSIS — N184 Chronic kidney disease, stage 4 (severe): Secondary | ICD-10-CM | POA: Diagnosis not present

## 2019-02-13 DIAGNOSIS — E1122 Type 2 diabetes mellitus with diabetic chronic kidney disease: Secondary | ICD-10-CM | POA: Diagnosis not present

## 2019-02-13 DIAGNOSIS — I509 Heart failure, unspecified: Secondary | ICD-10-CM | POA: Diagnosis not present

## 2019-02-13 DIAGNOSIS — I213 ST elevation (STEMI) myocardial infarction of unspecified site: Secondary | ICD-10-CM | POA: Diagnosis not present

## 2019-02-13 DIAGNOSIS — D631 Anemia in chronic kidney disease: Secondary | ICD-10-CM | POA: Diagnosis not present

## 2019-02-14 DIAGNOSIS — D631 Anemia in chronic kidney disease: Secondary | ICD-10-CM | POA: Diagnosis not present

## 2019-02-14 DIAGNOSIS — I213 ST elevation (STEMI) myocardial infarction of unspecified site: Secondary | ICD-10-CM | POA: Diagnosis not present

## 2019-02-14 DIAGNOSIS — I130 Hypertensive heart and chronic kidney disease with heart failure and stage 1 through stage 4 chronic kidney disease, or unspecified chronic kidney disease: Secondary | ICD-10-CM | POA: Diagnosis not present

## 2019-02-14 DIAGNOSIS — R2689 Other abnormalities of gait and mobility: Secondary | ICD-10-CM | POA: Diagnosis not present

## 2019-02-14 DIAGNOSIS — I509 Heart failure, unspecified: Secondary | ICD-10-CM | POA: Diagnosis not present

## 2019-02-14 DIAGNOSIS — N184 Chronic kidney disease, stage 4 (severe): Secondary | ICD-10-CM | POA: Diagnosis not present

## 2019-02-14 DIAGNOSIS — G894 Chronic pain syndrome: Secondary | ICD-10-CM | POA: Diagnosis not present

## 2019-02-14 DIAGNOSIS — E1122 Type 2 diabetes mellitus with diabetic chronic kidney disease: Secondary | ICD-10-CM | POA: Diagnosis not present

## 2019-02-14 DIAGNOSIS — M179 Osteoarthritis of knee, unspecified: Secondary | ICD-10-CM | POA: Diagnosis not present

## 2019-02-14 DIAGNOSIS — K219 Gastro-esophageal reflux disease without esophagitis: Secondary | ICD-10-CM | POA: Diagnosis not present

## 2019-02-18 DIAGNOSIS — E1122 Type 2 diabetes mellitus with diabetic chronic kidney disease: Secondary | ICD-10-CM | POA: Diagnosis not present

## 2019-02-18 DIAGNOSIS — I213 ST elevation (STEMI) myocardial infarction of unspecified site: Secondary | ICD-10-CM | POA: Diagnosis not present

## 2019-02-18 DIAGNOSIS — I509 Heart failure, unspecified: Secondary | ICD-10-CM | POA: Diagnosis not present

## 2019-02-18 DIAGNOSIS — I130 Hypertensive heart and chronic kidney disease with heart failure and stage 1 through stage 4 chronic kidney disease, or unspecified chronic kidney disease: Secondary | ICD-10-CM | POA: Diagnosis not present

## 2019-02-18 DIAGNOSIS — D631 Anemia in chronic kidney disease: Secondary | ICD-10-CM | POA: Diagnosis not present

## 2019-02-18 DIAGNOSIS — N184 Chronic kidney disease, stage 4 (severe): Secondary | ICD-10-CM | POA: Diagnosis not present

## 2019-02-19 ENCOUNTER — Other Ambulatory Visit: Payer: Self-pay

## 2019-02-19 NOTE — Patient Outreach (Signed)
[facility]) Care Management  02/19/2019  Karen [name] 08/25/1923 [id number]   Referral received from [facility] team for complex case management.  Confirmed that Ms. Atayde was admitted to [facility], previously [place]. [facility] case closure complete.     [place] [phone]

## 2019-02-20 DIAGNOSIS — I213 ST elevation (STEMI) myocardial infarction of unspecified site: Secondary | ICD-10-CM | POA: Diagnosis not present

## 2019-02-20 DIAGNOSIS — E1122 Type 2 diabetes mellitus with diabetic chronic kidney disease: Secondary | ICD-10-CM | POA: Diagnosis not present

## 2019-02-20 DIAGNOSIS — I130 Hypertensive heart and chronic kidney disease with heart failure and stage 1 through stage 4 chronic kidney disease, or unspecified chronic kidney disease: Secondary | ICD-10-CM | POA: Diagnosis not present

## 2019-02-20 DIAGNOSIS — I509 Heart failure, unspecified: Secondary | ICD-10-CM | POA: Diagnosis not present

## 2019-02-20 DIAGNOSIS — D631 Anemia in chronic kidney disease: Secondary | ICD-10-CM | POA: Diagnosis not present

## 2019-02-20 DIAGNOSIS — N184 Chronic kidney disease, stage 4 (severe): Secondary | ICD-10-CM | POA: Diagnosis not present

## 2019-02-21 DIAGNOSIS — I509 Heart failure, unspecified: Secondary | ICD-10-CM | POA: Diagnosis not present

## 2019-02-21 DIAGNOSIS — I213 ST elevation (STEMI) myocardial infarction of unspecified site: Secondary | ICD-10-CM | POA: Diagnosis not present

## 2019-02-21 DIAGNOSIS — D631 Anemia in chronic kidney disease: Secondary | ICD-10-CM | POA: Diagnosis not present

## 2019-02-21 DIAGNOSIS — N184 Chronic kidney disease, stage 4 (severe): Secondary | ICD-10-CM | POA: Diagnosis not present

## 2019-02-21 DIAGNOSIS — I130 Hypertensive heart and chronic kidney disease with heart failure and stage 1 through stage 4 chronic kidney disease, or unspecified chronic kidney disease: Secondary | ICD-10-CM | POA: Diagnosis not present

## 2019-02-21 DIAGNOSIS — E1122 Type 2 diabetes mellitus with diabetic chronic kidney disease: Secondary | ICD-10-CM | POA: Diagnosis not present

## 2019-02-25 ENCOUNTER — Other Ambulatory Visit: Payer: Self-pay | Admitting: *Deleted

## 2019-02-25 DIAGNOSIS — I509 Heart failure, unspecified: Secondary | ICD-10-CM | POA: Diagnosis not present

## 2019-02-25 DIAGNOSIS — E1122 Type 2 diabetes mellitus with diabetic chronic kidney disease: Secondary | ICD-10-CM | POA: Diagnosis not present

## 2019-02-25 DIAGNOSIS — I130 Hypertensive heart and chronic kidney disease with heart failure and stage 1 through stage 4 chronic kidney disease, or unspecified chronic kidney disease: Secondary | ICD-10-CM | POA: Diagnosis not present

## 2019-02-25 DIAGNOSIS — I213 ST elevation (STEMI) myocardial infarction of unspecified site: Secondary | ICD-10-CM | POA: Diagnosis not present

## 2019-02-25 DIAGNOSIS — D631 Anemia in chronic kidney disease: Secondary | ICD-10-CM | POA: Diagnosis not present

## 2019-02-25 DIAGNOSIS — N184 Chronic kidney disease, stage 4 (severe): Secondary | ICD-10-CM | POA: Diagnosis not present

## 2019-02-25 MED ORDER — LANTUS SOLOSTAR 100 UNIT/ML ~~LOC~~ SOPN: 28.0000 [IU] | PEN_INJECTOR | Freq: Every day | SUBCUTANEOUS | 1 refills | Status: DC

## 2019-02-27 ENCOUNTER — Other Ambulatory Visit: Payer: Self-pay | Admitting: Family Medicine

## 2019-02-27 DIAGNOSIS — I130 Hypertensive heart and chronic kidney disease with heart failure and stage 1 through stage 4 chronic kidney disease, or unspecified chronic kidney disease: Secondary | ICD-10-CM | POA: Diagnosis not present

## 2019-02-27 DIAGNOSIS — N184 Chronic kidney disease, stage 4 (severe): Secondary | ICD-10-CM | POA: Diagnosis not present

## 2019-02-27 DIAGNOSIS — D631 Anemia in chronic kidney disease: Secondary | ICD-10-CM | POA: Diagnosis not present

## 2019-02-27 DIAGNOSIS — I213 ST elevation (STEMI) myocardial infarction of unspecified site: Secondary | ICD-10-CM | POA: Diagnosis not present

## 2019-02-27 DIAGNOSIS — E1122 Type 2 diabetes mellitus with diabetic chronic kidney disease: Secondary | ICD-10-CM | POA: Diagnosis not present

## 2019-02-27 DIAGNOSIS — I509 Heart failure, unspecified: Secondary | ICD-10-CM | POA: Diagnosis not present

## 2019-02-27 NOTE — Telephone Encounter (Signed)
Last office visit 12/13/2018 follow up.  Last refilled 01/31/2019 for #90 with no refills.  Next Appt: 03/05/2019 for 3 month follow up.

## 2019-02-28 ENCOUNTER — Telehealth: Payer: Self-pay | Admitting: Internal Medicine

## 2019-02-28 DIAGNOSIS — I130 Hypertensive heart and chronic kidney disease with heart failure and stage 1 through stage 4 chronic kidney disease, or unspecified chronic kidney disease: Secondary | ICD-10-CM | POA: Diagnosis not present

## 2019-02-28 DIAGNOSIS — N184 Chronic kidney disease, stage 4 (severe): Secondary | ICD-10-CM | POA: Diagnosis not present

## 2019-02-28 DIAGNOSIS — D631 Anemia in chronic kidney disease: Secondary | ICD-10-CM | POA: Diagnosis not present

## 2019-02-28 DIAGNOSIS — E1122 Type 2 diabetes mellitus with diabetic chronic kidney disease: Secondary | ICD-10-CM | POA: Diagnosis not present

## 2019-02-28 DIAGNOSIS — I213 ST elevation (STEMI) myocardial infarction of unspecified site: Secondary | ICD-10-CM | POA: Diagnosis not present

## 2019-02-28 DIAGNOSIS — I509 Heart failure, unspecified: Secondary | ICD-10-CM | POA: Diagnosis not present

## 2019-02-28 NOTE — Telephone Encounter (Signed)
Spoke with Karen [name] and she was questioning what vitamins Dr. Cruzita Lederer prescribed-nurse stated that the patient took the vitamins for a while and they worked but they are no longer working-I did look in chart and was unable to find what vitamins she was referring to- patient having nerve pain so bad that it is waking her up at night and the Nurse would like a recommendation on what else can be done-patient is currently taking Gabapentin for the nerve pain but states it does not help much and taking more of it makes her very drowsy-please advise in dr. Arman Filter absence

## 2019-02-28 NOTE — Telephone Encounter (Signed)
Please refer to Dr. Ellison's response 

## 2019-02-28 NOTE — Telephone Encounter (Signed)
Dr Cruzita Lederer advised: alpha-lipoic acid 600 mg twice a day - B complex 1 tablet a day  If this does not help, several B-vitamins and folate may help. Otherwise, you could ask Dr Diona Browner, who rxs gabapentin.

## 2019-02-28 NOTE — Telephone Encounter (Signed)
Hospice Nurse, Izora Gala called in regards to the vitamins Dr.Gherghe prescribed patient to help with her neuropathy and states it did help for a while but states she is back in a lot of pain again and it puts her into tears. Would like any suggestions on what they could do.  Ph # [phone] Izora Gala  Please Advise, Thanks

## 2019-03-01 ENCOUNTER — Other Ambulatory Visit: Payer: Self-pay

## 2019-03-01 DIAGNOSIS — G894 Chronic pain syndrome: Secondary | ICD-10-CM

## 2019-03-01 MED ORDER — HYDROCODONE-ACETAMINOPHEN 7.5-325 MG PO TABS: 1.0000 | ORAL_TABLET | Freq: Four times a day (QID) | ORAL | 0 refills | Status: DC | PRN

## 2019-03-01 NOTE — Telephone Encounter (Signed)
Karen [name] with Karen [name] left a VM on triage line stating she needs a refill of hydrocodone sent to the [facility].  Also, she is having increased pain at night with her neuropathy. Legs are swelling and pain shoots up her legs. Wanting to know what she might be able to do.  Call Karen [name] back at [phone].

## 2019-03-01 NOTE — Telephone Encounter (Signed)
HAve her set up follow up if pain in legs worsening.

## 2019-03-01 NOTE — Telephone Encounter (Signed)
Karen [name] with [facility] notified as instructed by telephone.

## 2019-03-02 DIAGNOSIS — L03115 Cellulitis of right lower limb: Secondary | ICD-10-CM | POA: Diagnosis not present

## 2019-03-02 DIAGNOSIS — E1122 Type 2 diabetes mellitus with diabetic chronic kidney disease: Secondary | ICD-10-CM | POA: Diagnosis not present

## 2019-03-02 DIAGNOSIS — K222 Esophageal obstruction: Secondary | ICD-10-CM | POA: Diagnosis not present

## 2019-03-02 DIAGNOSIS — E785 Hyperlipidemia, unspecified: Secondary | ICD-10-CM | POA: Diagnosis not present

## 2019-03-02 DIAGNOSIS — I69818 Other symptoms and signs involving cognitive functions following other cerebrovascular disease: Secondary | ICD-10-CM | POA: Diagnosis not present

## 2019-03-02 DIAGNOSIS — Z87440 Personal history of urinary (tract) infections: Secondary | ICD-10-CM | POA: Diagnosis not present

## 2019-03-02 DIAGNOSIS — I509 Heart failure, unspecified: Secondary | ICD-10-CM | POA: Diagnosis not present

## 2019-03-02 DIAGNOSIS — N184 Chronic kidney disease, stage 4 (severe): Secondary | ICD-10-CM | POA: Diagnosis not present

## 2019-03-02 DIAGNOSIS — D631 Anemia in chronic kidney disease: Secondary | ICD-10-CM | POA: Diagnosis not present

## 2019-03-02 DIAGNOSIS — E559 Vitamin D deficiency, unspecified: Secondary | ICD-10-CM | POA: Diagnosis not present

## 2019-03-02 DIAGNOSIS — I4891 Unspecified atrial fibrillation: Secondary | ICD-10-CM | POA: Diagnosis not present

## 2019-03-02 DIAGNOSIS — L03116 Cellulitis of left lower limb: Secondary | ICD-10-CM | POA: Diagnosis not present

## 2019-03-02 DIAGNOSIS — S91302D Unspecified open wound, left foot, subsequent encounter: Secondary | ICD-10-CM | POA: Diagnosis not present

## 2019-03-02 DIAGNOSIS — F419 Anxiety disorder, unspecified: Secondary | ICD-10-CM | POA: Diagnosis not present

## 2019-03-02 DIAGNOSIS — I70209 Unspecified atherosclerosis of native arteries of extremities, unspecified extremity: Secondary | ICD-10-CM | POA: Diagnosis not present

## 2019-03-02 DIAGNOSIS — E039 Hypothyroidism, unspecified: Secondary | ICD-10-CM | POA: Diagnosis not present

## 2019-03-02 DIAGNOSIS — Z993 Dependence on wheelchair: Secondary | ICD-10-CM | POA: Diagnosis not present

## 2019-03-02 DIAGNOSIS — E1151 Type 2 diabetes mellitus with diabetic peripheral angiopathy without gangrene: Secondary | ICD-10-CM | POA: Diagnosis not present

## 2019-03-02 DIAGNOSIS — K219 Gastro-esophageal reflux disease without esophagitis: Secondary | ICD-10-CM | POA: Diagnosis not present

## 2019-03-02 DIAGNOSIS — I213 ST elevation (STEMI) myocardial infarction of unspecified site: Secondary | ICD-10-CM | POA: Diagnosis not present

## 2019-03-02 DIAGNOSIS — H3530 Unspecified macular degeneration: Secondary | ICD-10-CM | POA: Diagnosis not present

## 2019-03-02 DIAGNOSIS — S91301D Unspecified open wound, right foot, subsequent encounter: Secondary | ICD-10-CM | POA: Diagnosis not present

## 2019-03-02 DIAGNOSIS — H04129 Dry eye syndrome of unspecified lacrimal gland: Secondary | ICD-10-CM | POA: Diagnosis not present

## 2019-03-02 DIAGNOSIS — I25119 Atherosclerotic heart disease of native coronary artery with unspecified angina pectoris: Secondary | ICD-10-CM | POA: Diagnosis not present

## 2019-03-02 DIAGNOSIS — I130 Hypertensive heart and chronic kidney disease with heart failure and stage 1 through stage 4 chronic kidney disease, or unspecified chronic kidney disease: Secondary | ICD-10-CM | POA: Diagnosis not present

## 2019-03-04 ENCOUNTER — Ambulatory Visit: Payer: Medicare Other | Admitting: Podiatry

## 2019-03-04 ENCOUNTER — Encounter: Payer: Self-pay | Admitting: Podiatry

## 2019-03-04 ENCOUNTER — Other Ambulatory Visit: Payer: Self-pay

## 2019-03-04 VITALS — Temp 98.0°F

## 2019-03-04 DIAGNOSIS — M79675 Pain in left toe(s): Secondary | ICD-10-CM

## 2019-03-04 DIAGNOSIS — I509 Heart failure, unspecified: Secondary | ICD-10-CM | POA: Diagnosis not present

## 2019-03-04 DIAGNOSIS — B351 Tinea unguium: Secondary | ICD-10-CM | POA: Diagnosis not present

## 2019-03-04 DIAGNOSIS — I130 Hypertensive heart and chronic kidney disease with heart failure and stage 1 through stage 4 chronic kidney disease, or unspecified chronic kidney disease: Secondary | ICD-10-CM | POA: Diagnosis not present

## 2019-03-04 DIAGNOSIS — M79674 Pain in right toe(s): Secondary | ICD-10-CM | POA: Diagnosis not present

## 2019-03-04 DIAGNOSIS — I213 ST elevation (STEMI) myocardial infarction of unspecified site: Secondary | ICD-10-CM | POA: Diagnosis not present

## 2019-03-04 DIAGNOSIS — M722 Plantar fascial fibromatosis: Secondary | ICD-10-CM | POA: Diagnosis not present

## 2019-03-04 DIAGNOSIS — D631 Anemia in chronic kidney disease: Secondary | ICD-10-CM | POA: Diagnosis not present

## 2019-03-04 DIAGNOSIS — N184 Chronic kidney disease, stage 4 (severe): Secondary | ICD-10-CM | POA: Diagnosis not present

## 2019-03-04 DIAGNOSIS — E1122 Type 2 diabetes mellitus with diabetic chronic kidney disease: Secondary | ICD-10-CM | POA: Diagnosis not present

## 2019-03-05 ENCOUNTER — Encounter: Payer: Self-pay | Admitting: Family Medicine

## 2019-03-05 ENCOUNTER — Other Ambulatory Visit: Payer: Self-pay | Admitting: Internal Medicine

## 2019-03-05 ENCOUNTER — Ambulatory Visit (INDEPENDENT_AMBULATORY_CARE_PROVIDER_SITE_OTHER): Admitting: Family Medicine

## 2019-03-05 VITALS — BP 138/60 | HR 71 | Ht 63.0 in | Wt 138.0 lb

## 2019-03-05 DIAGNOSIS — N183 Chronic kidney disease, stage 3 unspecified: Secondary | ICD-10-CM

## 2019-03-05 DIAGNOSIS — I10 Essential (primary) hypertension: Secondary | ICD-10-CM | POA: Diagnosis not present

## 2019-03-05 DIAGNOSIS — F1120 Opioid dependence, uncomplicated: Secondary | ICD-10-CM

## 2019-03-05 DIAGNOSIS — G894 Chronic pain syndrome: Secondary | ICD-10-CM

## 2019-03-05 DIAGNOSIS — L97421 Non-pressure chronic ulcer of left heel and midfoot limited to breakdown of skin: Secondary | ICD-10-CM

## 2019-03-05 DIAGNOSIS — E1143 Type 2 diabetes mellitus with diabetic autonomic (poly)neuropathy: Secondary | ICD-10-CM | POA: Diagnosis not present

## 2019-03-05 DIAGNOSIS — E1340 Other specified diabetes mellitus with diabetic neuropathy, unspecified: Secondary | ICD-10-CM

## 2019-03-05 DIAGNOSIS — E1165 Type 2 diabetes mellitus with hyperglycemia: Secondary | ICD-10-CM

## 2019-03-05 MED ORDER — TRIMETHOPRIM 100 MG PO TABS: ORAL_TABLET | ORAL | 1 refills | Status: DC

## 2019-03-05 NOTE — Assessment & Plan Note (Signed)
Re-eval when labs done for ENDO

## 2019-03-05 NOTE — Assessment & Plan Note (Signed)
Chronic pain management:  Toe pain from neuropathy may better with vitamins.  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  Roxanol prn.. from hospices # pills per month: 120 Last UDS date:04/03/2018 Opioid Treatment Agreement signed (Y/N):04/03/2018 Opioid Treatment Agreement last reviewed with patient:  Y NCCSRS reviewed this encounter (include red flags):  Y no red flags

## 2019-03-05 NOTE — Progress Notes (Signed)
VIRTUAL VISIT Due to national recommendations of social distancing due to [place] 19, a virtual visit is felt to be most appropriate for this patient at this time.   I connected with the patient on 03/05/19 at  2:00 PM EDT by virtual telehealth platform and verified that I am speaking with the correct person using two identifiers.   I discussed the limitations, risks, security and privacy concerns of performing an evaluation and management service by  virtual telehealth platform and the availability of in person appointments. I also discussed with the patient that there may be a patient responsible charge related to this service. The patient expressed understanding and agreed to proceed.  Patient location: Home Provider Location: [facility] Participants: Eliezer Lofts and [name]   Chief Complaint  Patient presents with  . Follow-up    3 month    History of Present Illness:  85 year old female presents for 3 months follow up chronic pain managment She is currently being followed by hospice  DM: followed by ENDO Lab Results  Component Value Date   HGBA1C 6.6 (H) 08/03/2018   Hypertension:  Good control. Using lasix  40 mg for edema. Using medication without problems or lightheadedness: none Chest pain with exertion:none Edema:yes, improved Short of breath:none Average home BPs: Other issues:  Wt Readings from Last 3 Encounters:  03/05/19 138 lb (62.6 kg)  11/27/18 140 lb (63.5 kg)  10/16/18 136 lb 4.8 oz (61.8 kg)    FBS 178, CBG 103   Dr. Paulla Dolly... treated with steroids for plantar fasciits... foot pain is much better. Ulcers resolved.     Chronic pain management:  Toe pain from neuropathy may better with vitamins.  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  Roxanol prn.. from hospices # pills per month: 120 Last UDS date:04/03/2018 Opioid  Treatment Agreement signed (Y/N):04/03/2018 Opioid Treatment Agreement last reviewed with patient:  Y NCCSRS reviewed this encounter (include red flags):  Y no red flags    COVID 19 screen No recent travel or known exposure to [place] The patient denies respiratory symptoms of COVID 19 at this time.  The importance of social distancing was discussed today.   Review of Systems  Constitutional: Negative for chills and fever.  HENT: Negative for congestion and ear pain.   Eyes: Negative for pain and redness.  Respiratory: Negative for cough and shortness of breath.   Cardiovascular: Negative for chest pain, palpitations and leg swelling.  Gastrointestinal: Negative for abdominal pain, blood in stool, constipation, diarrhea, nausea and vomiting.  Genitourinary: Negative for dysuria.  Musculoskeletal: Negative for falls and myalgias.  Skin: Negative for rash.  Neurological: Negative for dizziness.  Psychiatric/Behavioral: Negative for depression. The patient is not nervous/anxious.       Past Medical History:  Diagnosis Date  . A-fib ([place])   . Abnormal involuntary movements(781.0)   . Anemia   . Anxiety   . Anxiety state, unspecified   . Arthritis    body, neck  . Benign hypertensive heart disease   . Benign hypertensive heart disease with congestive heart failure ([place])   . Bladder disorder    over active  . Cancer ([place])    skin, removed  . Cervicalgia   . CHF (congestive heart failure) ([place])   . Chronic kidney disease, stage III (moderate) (HCC)   . Congestive heart failure, unspecified   . Contact  dermatitis and other eczema, due to unspecified cause   . Corns and callosities   . Diabetes mellitus without complication ([place])   . Diarrhea   . Dyspnea 02/18/2015  . Dysuria 10/01/2018  . Edema   . Esophageal stricture   . Gastroparesis   . Gastroparesis   . GERD (gastroesophageal reflux disease)   . Hiatal hernia   . Hyperlipidemia   . Inflamed seborrheic keratosis   .  Inflammatory disease of breast   . Irregular heartbeat   . Lumbago   . Mixed incontinence urge and stress (female)(female)   . Nausea alone   . Nonspecific (abnormal) findings on radiological and other examination of skull and head   . Osteoarthrosis, unspecified whether generalized or localized, unspecified site   . Other and unspecified hyperlipidemia   . Other B-complex deficiencies   . Other functional disorder of bladder   . Other malaise and fatigue   . Other specified visual disturbances   . Pain in joint, lower leg   . Palpitations   . Peripheral vascular disease, unspecified ([place])   . Postmenopausal atrophic vaginitis   . Reflux esophagitis   . Seborrheic keratosis   . Sleep apnea   . Spasm of muscle   . Spinal stenosis   . Spinal stenosis, unspecified region other than cervical   . Stricture and stenosis of esophagus   . Stroke ([place])   . TIA (transient ischemic attack) 2012  . Transient ischemic attack (TIA), and cerebral infarction without residual deficits(V12.54)   . Type II or unspecified type diabetes mellitus with renal manifestations, not stated as uncontrolled(250.40)   . Unspecified constipation   . Unspecified essential hypertension   . Unspecified hereditary and idiopathic peripheral neuropathy   . Unspecified hypothyroidism   . Unspecified pruritic disorder   . Unspecified sleep apnea   . Unspecified vitamin D deficiency   . Urinary tract infection, site not specified   . Vitamin B12 deficiency     reports that she has never smoked. She has never used smokeless tobacco. She reports that she does not drink alcohol or use drugs.   Current Outpatient Medications:  .  Alpha-Lipoic Acid 600 MG TABS, Take 1 tablet by mouth 2 (two) times a day., Disp: , Rfl:  .  ALPRAZolam (XANAX) 0.5 MG tablet, TAKE 1 TABLET BY MOUTH THREE TIMES A DAY AS NEEDED, Disp: 90 tablet, Rfl: 1 .  amLODipine (NORVASC) 2.5 MG tablet, Take 1 tablet (2.5 mg total) by mouth daily., Disp: 90  tablet, Rfl: 1 .  aspirin EC 81 MG EC tablet, Take 1 tablet (81 mg total) by mouth daily., Disp: , Rfl:  .  atorvastatin (LIPITOR) 10 MG tablet, Take 1 tablet (10 mg total) by mouth daily at 6 PM., Disp: 90 tablet, Rfl: 1 .  b complex vitamins tablet, Take 1 tablet by mouth daily., Disp: , Rfl:  .  Cholecalciferol (VITAMIN D3) 1.25 MG (50000 UT) TABS, Take 1 tablet by mouth every 7 (seven) days. every Monday., Disp: 12 tablet, Rfl: 3 .  CRANBERRY PO, Take 300 mg by mouth daily., Disp: , Rfl:  .  [facility] NATURAL LUTEIN EYE HEALTH PO, Take 1 tablet by mouth daily. , Disp: , Rfl:  .  diclofenac sodium (VOLTAREN) 1 % GEL, Apply 4 g topically 4 (four) times daily. To bilateral knees, Disp: 100 g, Rfl: 5 .  furosemide (LASIX) 40 MG tablet, TAKE 1 TABLET BY MOUTH DAILY TO CONTROL EDEMA, Disp: 90 tablet, Rfl: 1 .  gabapentin (NEURONTIN) 100 MG capsule, TAKE TWO CAPSULES BY MOUTH AT BEDTIME TO HELP WITH TREMORS, Disp: , Rfl:  .  gabapentin (NEURONTIN) 300 MG capsule, Take 1 capsule (300 mg total) by mouth at bedtime., Disp: 90 capsule, Rfl: 0 .  glucagon (GLUCAGON EMERGENCY) 1 MG injection, Inject 1 mg into the muscle once as needed for up to 1 dose., Disp: 1 each, Rfl: 12 .  HYDROcodone-acetaminophen (NORCO) 7.5-325 MG tablet, Take 1 tablet by mouth every 6 (six) hours as needed for moderate pain., Disp: 120 tablet, Rfl: 0 .  Insulin Glargine (LANTUS SOLOSTAR) 100 UNIT/ML Solostar Pen, Inject 28 Units into the skin at bedtime., Disp: 15 mL, Rfl: 1 .  insulin lispro (HUMALOG KWIKPEN) 100 UNIT/ML KwikPen, Inject 10 units at breakfast, 16 units at lunch and 16 units at dinner, Disp: , Rfl:  .  Insulin Pen Needle (B-D ULTRAFINE III SHORT PEN) 31G X 8 MM MISC, Use with administering insulin. Dx. E11.29, Disp: 100 each, Rfl: 11 .  isosorbide mononitrate (IMDUR) 30 MG 24 hr tablet, Take 1 tablet (30 mg total) by mouth daily., Disp: 90 tablet, Rfl: 1 .  levothyroxine (SYNTHROID) 125 MCG tablet, Take 125 mcg by mouth  daily before breakfast., Disp: , Rfl:  .  metoprolol tartrate (LOPRESSOR) 25 MG tablet, Take 0.5 tablets (12.5 mg total) by mouth 2 (two) times daily., Disp: 90 tablet, Rfl: 1 .  Morphine Sulfate (MORPHINE CONCENTRATE) 10 MG/0.5ML SOLN concentrated solution, Place 0.5 mLs (10 mg total) under the tongue every 4 (four) hours as needed for severe pain (Chest pain or anginal equivalent)., Disp: 15 mL, Rfl: 0 .  Multiple Vitamins-Minerals (PRESERVISION AREDS 2 PO), Take 1 tablet by mouth 2 (two) times daily. , Disp: , Rfl:  .  nitroGLYCERIN (NITROSTAT) 0.4 MG SL tablet, Place 1 tablet (0.4 mg total) under the tongue every 5 (five) minutes as needed for chest pain., Disp: 25 tablet, Rfl: 12 .  omeprazole (PRILOSEC) 40 MG capsule, TAKE ONE (1) CAPSULE BY MOUTH 2 TIMES DAILY FOR STOMACH, Disp: 180 capsule, Rfl: 1 .  ondansetron (ZOFRAN) 4 MG tablet, TAKE ONE TABLET BY MOUTH EVERY 4 HOURS AS NEEDED FOR NAUSEA/VOMITING, Disp: 30 tablet, Rfl: 0 .  ONE TOUCH ULTRA TEST test strip, Use to test sugar three times daily dx E11.29, Disp: 100 each, Rfl: 6 .  Polyethyl Glycol-Propyl Glycol (SYSTANE OP), Place 1 drop into both eyes 2 (two) times daily. , Disp: , Rfl:  .  senna (SENOKOT) 8.6 MG TABS tablet, Take 1 tablet by mouth daily as needed for mild constipation., Disp: , Rfl:  .  triamcinolone cream (KENALOG) 0.1 %, Apply 1 application topically as needed. Mix with CeraVe cream apply twice daily for dry skin, Disp: , Rfl:  .  trimethoprim (TRIMPEX) 100 MG tablet, TAKE ONE TABLET BY MOUTH EVERY NIGHT AT BEDTIME TO PREVENT BLADDER INFECTION., Disp: 30 tablet, Rfl: 11 .  VITAMIN E PO, Take 400 Units by mouth daily., Disp: , Rfl:    Observations/Objective: Blood pressure 138/60, pulse 71, height 5\' 3"  (1.6 m), weight 138 lb (62.6 kg).   Physical Exam  Physical Exam Constitutional:      General: The patient is not in acute distress. Pulmonary:     Effort: Pulmonary effort is normal. No respiratory distress.   Neurological:     Mental Status: The patient is alert and oriented to person, place, and time.  Psychiatric:        Mood and Affect: Mood normal.  Behavior: Behavior normal.  Foot exam: no ulcers no redness, 1 plus edema bialterally Assessment and Plan  Poorly controlled type 2 diabetes mellitus with autonomic neuropathy (HCC) Due for re-eval with ENDO. Family to call and set up follow up appt.   Neuropathy due to secondary diabetes (HCC) Stable pain control with gabapentin and oxycodone.   Narcotic dependence (HCC)  Chronic pain management:  Toe pain from neuropathy may better with vitamins.  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  Roxanol prn.. from hospices # pills per month: 120 Last UDS date:04/03/2018 Opioid Treatment Agreement signed (Y/N):04/03/2018 Opioid Treatment Agreement last reviewed with patient:  Y NCCSRS reviewed this encounter (include red flags):  Y no red flags   Essential hypertension Well controlled. Continue current medication.   Chronic kidney disease, stage III (moderate) Re-eval when labs done for ENDO  Ulcer of left heel and midfoot, limited to breakdown of skin ([place]) Resolved.     I discussed the assessment and treatment plan with the patient. The patient was provided an opportunity to ask questions and all were answered. The patient agreed with the plan and demonstrated an understanding of the instructions.   The patient was advised to call back or seek an in-person evaluation if the symptoms worsen or if the condition fails to improve as anticipated.     Eliezer Lofts, MD

## 2019-03-05 NOTE — Patient Instructions (Signed)
Call to set up ENDO follow up with Dr. Cruzita Lederer.  Call here if you need labs done.

## 2019-03-05 NOTE — Assessment & Plan Note (Signed)
Due for re-eval with ENDO. Family to call and set up follow up appt.

## 2019-03-05 NOTE — Assessment & Plan Note (Signed)
Well controlled. Continue current medication.  

## 2019-03-05 NOTE — Assessment & Plan Note (Signed)
Resolved

## 2019-03-05 NOTE — Assessment & Plan Note (Signed)
Stable pain control with gabapentin and oxycodone.

## 2019-03-06 ENCOUNTER — Encounter: Payer: Self-pay | Admitting: Family Medicine

## 2019-03-06 DIAGNOSIS — I213 ST elevation (STEMI) myocardial infarction of unspecified site: Secondary | ICD-10-CM | POA: Diagnosis not present

## 2019-03-06 DIAGNOSIS — E1122 Type 2 diabetes mellitus with diabetic chronic kidney disease: Secondary | ICD-10-CM | POA: Diagnosis not present

## 2019-03-06 DIAGNOSIS — N184 Chronic kidney disease, stage 4 (severe): Secondary | ICD-10-CM | POA: Diagnosis not present

## 2019-03-06 DIAGNOSIS — D631 Anemia in chronic kidney disease: Secondary | ICD-10-CM | POA: Diagnosis not present

## 2019-03-06 DIAGNOSIS — I130 Hypertensive heart and chronic kidney disease with heart failure and stage 1 through stage 4 chronic kidney disease, or unspecified chronic kidney disease: Secondary | ICD-10-CM | POA: Diagnosis not present

## 2019-03-06 DIAGNOSIS — I509 Heart failure, unspecified: Secondary | ICD-10-CM | POA: Diagnosis not present

## 2019-03-06 NOTE — Progress Notes (Signed)
Mailed letter °

## 2019-03-06 NOTE — Progress Notes (Signed)
Subjective:   Patient ID: Karen [name], female   DOB: 85 y.o.   MRN: [id number]   HPI Patient presents stating that the nails need to be taken care of and that she is getting pain on the bottom of both heels that is been consistent.  Presents with caregiver   ROS      Objective:  Physical Exam  No neurovascular status unchanged with exquisite discomfort plantar aspect heel region bilateral with inflammation fluid buildup and thick yellow brittle nailbeds 1-5 both feet that are moderately tender     Assessment:  Mycotic nail infection 1-5 both feet with pain with discomfort in the plantar heel region bilateral     Plan:  H&P reviewed both conditions and today I did do sterile prep and injected the plantar fascia bilateral 3 mg Kenalog 5 mg Xylocaine and debrided nailbeds 1-5 both feet with no iatrogenic bleeding and reappoint for routine care

## 2019-03-07 DIAGNOSIS — N184 Chronic kidney disease, stage 4 (severe): Secondary | ICD-10-CM | POA: Diagnosis not present

## 2019-03-07 DIAGNOSIS — D631 Anemia in chronic kidney disease: Secondary | ICD-10-CM | POA: Diagnosis not present

## 2019-03-07 DIAGNOSIS — E1122 Type 2 diabetes mellitus with diabetic chronic kidney disease: Secondary | ICD-10-CM | POA: Diagnosis not present

## 2019-03-07 DIAGNOSIS — I509 Heart failure, unspecified: Secondary | ICD-10-CM | POA: Diagnosis not present

## 2019-03-07 DIAGNOSIS — I130 Hypertensive heart and chronic kidney disease with heart failure and stage 1 through stage 4 chronic kidney disease, or unspecified chronic kidney disease: Secondary | ICD-10-CM | POA: Diagnosis not present

## 2019-03-07 DIAGNOSIS — I213 ST elevation (STEMI) myocardial infarction of unspecified site: Secondary | ICD-10-CM | POA: Diagnosis not present

## 2019-03-09 ENCOUNTER — Other Ambulatory Visit: Payer: Self-pay | Admitting: Internal Medicine

## 2019-03-11 DIAGNOSIS — N184 Chronic kidney disease, stage 4 (severe): Secondary | ICD-10-CM | POA: Diagnosis not present

## 2019-03-11 DIAGNOSIS — E1122 Type 2 diabetes mellitus with diabetic chronic kidney disease: Secondary | ICD-10-CM | POA: Diagnosis not present

## 2019-03-11 DIAGNOSIS — I213 ST elevation (STEMI) myocardial infarction of unspecified site: Secondary | ICD-10-CM | POA: Diagnosis not present

## 2019-03-11 DIAGNOSIS — I130 Hypertensive heart and chronic kidney disease with heart failure and stage 1 through stage 4 chronic kidney disease, or unspecified chronic kidney disease: Secondary | ICD-10-CM | POA: Diagnosis not present

## 2019-03-11 DIAGNOSIS — I509 Heart failure, unspecified: Secondary | ICD-10-CM | POA: Diagnosis not present

## 2019-03-11 DIAGNOSIS — D631 Anemia in chronic kidney disease: Secondary | ICD-10-CM | POA: Diagnosis not present

## 2019-03-13 DIAGNOSIS — I213 ST elevation (STEMI) myocardial infarction of unspecified site: Secondary | ICD-10-CM | POA: Diagnosis not present

## 2019-03-13 DIAGNOSIS — N184 Chronic kidney disease, stage 4 (severe): Secondary | ICD-10-CM | POA: Diagnosis not present

## 2019-03-13 DIAGNOSIS — I509 Heart failure, unspecified: Secondary | ICD-10-CM | POA: Diagnosis not present

## 2019-03-13 DIAGNOSIS — I130 Hypertensive heart and chronic kidney disease with heart failure and stage 1 through stage 4 chronic kidney disease, or unspecified chronic kidney disease: Secondary | ICD-10-CM | POA: Diagnosis not present

## 2019-03-13 DIAGNOSIS — E1122 Type 2 diabetes mellitus with diabetic chronic kidney disease: Secondary | ICD-10-CM | POA: Diagnosis not present

## 2019-03-13 DIAGNOSIS — D631 Anemia in chronic kidney disease: Secondary | ICD-10-CM | POA: Diagnosis not present

## 2019-03-14 ENCOUNTER — Encounter: Payer: Self-pay | Admitting: Internal Medicine

## 2019-03-14 ENCOUNTER — Other Ambulatory Visit: Payer: Self-pay | Admitting: *Deleted

## 2019-03-14 DIAGNOSIS — I130 Hypertensive heart and chronic kidney disease with heart failure and stage 1 through stage 4 chronic kidney disease, or unspecified chronic kidney disease: Secondary | ICD-10-CM | POA: Diagnosis not present

## 2019-03-14 DIAGNOSIS — K219 Gastro-esophageal reflux disease without esophagitis: Secondary | ICD-10-CM

## 2019-03-14 DIAGNOSIS — I509 Heart failure, unspecified: Secondary | ICD-10-CM | POA: Diagnosis not present

## 2019-03-14 DIAGNOSIS — E1122 Type 2 diabetes mellitus with diabetic chronic kidney disease: Secondary | ICD-10-CM | POA: Diagnosis not present

## 2019-03-14 DIAGNOSIS — D631 Anemia in chronic kidney disease: Secondary | ICD-10-CM | POA: Diagnosis not present

## 2019-03-14 DIAGNOSIS — N184 Chronic kidney disease, stage 4 (severe): Secondary | ICD-10-CM | POA: Diagnosis not present

## 2019-03-14 DIAGNOSIS — I213 ST elevation (STEMI) myocardial infarction of unspecified site: Secondary | ICD-10-CM | POA: Diagnosis not present

## 2019-03-14 NOTE — Telephone Encounter (Signed)
Makayla RN left message with Triage. Pt is out of her levothyroxine and we have not filled it before Makayla, RN requested [place] to f/u with pt regarding getting her levothyroxine and omeprazole filled. Makayla verified with pharmacy that they don't have the refill from omeprazole that was sent on 12/25/18 and request [place] to resend med. Refill request sent to PCP to approve since we have never filled the levothyroxine before  [facility]

## 2019-03-15 ENCOUNTER — Encounter: Payer: Self-pay | Admitting: Cardiovascular Disease

## 2019-03-15 MED ORDER — OMEPRAZOLE 40 MG PO CPDR: 40.0000 mg | DELAYED_RELEASE_CAPSULE | Freq: Every day | ORAL | 1 refills | Status: AC

## 2019-03-15 MED ORDER — LEVOTHYROXINE SODIUM 125 MCG PO TABS: 125.0000 ug | ORAL_TABLET | Freq: Every day | ORAL | 1 refills | Status: DC

## 2019-03-15 MED ORDER — LEVOTHYROXINE SODIUM 125 MCG PO TABS: 125.0000 ug | ORAL_TABLET | Freq: Every day | ORAL | 0 refills | Status: DC

## 2019-03-15 NOTE — Telephone Encounter (Signed)
[facility]  Spoke with Karen [name] and he states he has already picked up the medications.  Karen [name] is scheduled to see Dr. Cruzita Lederer on 03/21/2019 so I told Karen [name] to make sure Dr. Cruzita Lederer checks Karen [name]'s thyroid labs at that appointment.

## 2019-03-15 NOTE — Telephone Encounter (Signed)
Okay to refill thyroid med but needs thyroid test scheduled if not testing elesewhere.

## 2019-03-15 NOTE — Progress Notes (Signed)
Karen [name] is under hospice care .  She sent [place] a patient advice request and stated she was doing well and wished to cancel her upcoming appt with [place].

## 2019-03-15 NOTE — Addendum Note (Signed)
Addended by: Carter Kitten on: 03/15/2019 04:08 PM   Modules accepted: Orders

## 2019-03-17 DIAGNOSIS — K219 Gastro-esophageal reflux disease without esophagitis: Secondary | ICD-10-CM | POA: Diagnosis not present

## 2019-03-17 DIAGNOSIS — G894 Chronic pain syndrome: Secondary | ICD-10-CM | POA: Diagnosis not present

## 2019-03-17 DIAGNOSIS — M179 Osteoarthritis of knee, unspecified: Secondary | ICD-10-CM | POA: Diagnosis not present

## 2019-03-17 DIAGNOSIS — R2689 Other abnormalities of gait and mobility: Secondary | ICD-10-CM | POA: Diagnosis not present

## 2019-03-18 ENCOUNTER — Telehealth: Payer: Medicare Other | Admitting: Cardiovascular Disease

## 2019-03-18 DIAGNOSIS — I130 Hypertensive heart and chronic kidney disease with heart failure and stage 1 through stage 4 chronic kidney disease, or unspecified chronic kidney disease: Secondary | ICD-10-CM | POA: Diagnosis not present

## 2019-03-18 DIAGNOSIS — D631 Anemia in chronic kidney disease: Secondary | ICD-10-CM | POA: Diagnosis not present

## 2019-03-18 DIAGNOSIS — I213 ST elevation (STEMI) myocardial infarction of unspecified site: Secondary | ICD-10-CM | POA: Diagnosis not present

## 2019-03-18 DIAGNOSIS — N184 Chronic kidney disease, stage 4 (severe): Secondary | ICD-10-CM | POA: Diagnosis not present

## 2019-03-18 DIAGNOSIS — I509 Heart failure, unspecified: Secondary | ICD-10-CM | POA: Diagnosis not present

## 2019-03-18 DIAGNOSIS — E1122 Type 2 diabetes mellitus with diabetic chronic kidney disease: Secondary | ICD-10-CM | POA: Diagnosis not present

## 2019-03-20 DIAGNOSIS — M170 Bilateral primary osteoarthritis of knee: Secondary | ICD-10-CM | POA: Diagnosis not present

## 2019-03-21 ENCOUNTER — Other Ambulatory Visit: Payer: Self-pay

## 2019-03-21 ENCOUNTER — Encounter: Payer: Self-pay | Admitting: Internal Medicine

## 2019-03-21 ENCOUNTER — Ambulatory Visit (INDEPENDENT_AMBULATORY_CARE_PROVIDER_SITE_OTHER): Admitting: Internal Medicine

## 2019-03-21 VITALS — BP 120/60 | HR 83 | Ht 63.0 in | Wt 136.0 lb

## 2019-03-21 DIAGNOSIS — E039 Hypothyroidism, unspecified: Secondary | ICD-10-CM

## 2019-03-21 DIAGNOSIS — E1122 Type 2 diabetes mellitus with diabetic chronic kidney disease: Secondary | ICD-10-CM | POA: Diagnosis not present

## 2019-03-21 DIAGNOSIS — E1143 Type 2 diabetes mellitus with diabetic autonomic (poly)neuropathy: Secondary | ICD-10-CM | POA: Diagnosis not present

## 2019-03-21 DIAGNOSIS — D631 Anemia in chronic kidney disease: Secondary | ICD-10-CM | POA: Diagnosis not present

## 2019-03-21 DIAGNOSIS — E1165 Type 2 diabetes mellitus with hyperglycemia: Secondary | ICD-10-CM

## 2019-03-21 DIAGNOSIS — I509 Heart failure, unspecified: Secondary | ICD-10-CM | POA: Diagnosis not present

## 2019-03-21 DIAGNOSIS — N184 Chronic kidney disease, stage 4 (severe): Secondary | ICD-10-CM | POA: Diagnosis not present

## 2019-03-21 DIAGNOSIS — E785 Hyperlipidemia, unspecified: Secondary | ICD-10-CM

## 2019-03-21 DIAGNOSIS — I130 Hypertensive heart and chronic kidney disease with heart failure and stage 1 through stage 4 chronic kidney disease, or unspecified chronic kidney disease: Secondary | ICD-10-CM | POA: Diagnosis not present

## 2019-03-21 DIAGNOSIS — I213 ST elevation (STEMI) myocardial infarction of unspecified site: Secondary | ICD-10-CM | POA: Diagnosis not present

## 2019-03-21 LAB — LIPID PANEL
Cholesterol: 150 mg/dL (ref 0–200)
HDL: 41.4 mg/dL (ref >39.00)
LDL Cholesterol: 91 mg/dL (ref 0–99)
NonHDL: 109.03
Total CHOL/HDL Ratio: 4
Triglycerides: 88 mg/dL (ref 0.0–149.0)
VLDL: 17.6 mg/dL (ref 0.0–40.0)

## 2019-03-21 LAB — POCT GLYCOSYLATED HEMOGLOBIN (HGB A1C): Hemoglobin A1C: 6.1 % — AB (ref 4.0–5.6)

## 2019-03-21 MED ORDER — LANTUS SOLOSTAR 100 UNIT/ML ~~LOC~~ SOPN: 13.0000 [IU] | PEN_INJECTOR | Freq: Every day | SUBCUTANEOUS | 11 refills | Status: AC

## 2019-03-21 NOTE — Patient Instructions (Addendum)
Please continue: - Humalog 6-8-8 units before each meal  Please decrease: - Lantus 13-14 units at bedtime  Continue the following combination for neuropathy: - alpha-lipoic acid 600 mg 2x a day - B complex 1 tablet a day  Move the Omeprazole before lunch.  Stop B complex 2 days before next visit.  Take the thyroid hormone every day, with water, at least 30 minutes before breakfast, separated by at least 4 hours from: - acid reflux medications - calcium - iron - multivitamins  Please return in 3-4 months with your sugar log.

## 2019-03-21 NOTE — Progress Notes (Signed)
Patient ID: Karen [name], female   DOB: 1923/10/26, 85 y.o.   MRN: [id number]   HPI: Karen [name] is a 85 y.o.-year-old female, initially referred by her PCP, Dr. Diona Browner, presenting for follow-up for DM2, dx in 1984, insulin-dependent since ~2000, uncontrolled, with complications (CKD, PN, macular degenera low if you can hear me you have your camera off so you will probably need to press on the bottom of the at the press on the bottom at the bottom of your screen tion).  Her son [name]) accompanies the patient today to her appointment.  She offers part of the history especially regarding her blood sugars, insulin doses, diet.  He is also my patient.  She had steroid knee injections yesterday >> sugars higher.  Reviewed latest HbA1c level: Lab Results  Component Value Date   HGBA1C 6.6 (H) 08/03/2018   HGBA1C 8.0 (H) 03/22/2018   HGBA1C 7.1 (H) 11/21/2017   HGBA1C 6.7 (H) 07/11/2017   HGBA1C 7.7 (H) 03/22/2017   HGBA1C 6.5 (H) 12/19/2016   HGBA1C 6.6 (H) 09/15/2016   HGBA1C 5.8 (H) 07/18/2016   HGBA1C 6.2 (H) 05/11/2016   HGBA1C 8.0 (H) 01/18/2016   Pt is on a regimen of: - Lantus 42 >> 35 >> 28-30 >> 16 units at bedtime  - Humalog 05-15-15 (19 if has dessert) units before each meal >> 05-17-15 units >> 6-8-8  Pt checks her sugars 4 times a day: - am: 60-150  >> 50, 54-154, 194 >> 70-110 (ave 80), 363 (seroids) - 2h after b'fast: n/c - before lunch: 100-150 >> n/c >> 80-150 - 2h after lunch: n/c - before dinner: 140-200 >> 54-220, last 4 days: 50-80s >> 80-175 - 2h after dinner: 190-220 >> 100-200s  >> 125-200 - bedtime: n/c  - nighttime: n/c Lowest sugar was 37 - mid-day - took Humalog but did not eat enough for b'fast >> 50 >> 70 >> 49 lunchtime (rarely); she has hypoglycemia awareness in the 60s.  Highest sugar was 600 - steroid inj >> 300 >> 363.  She lives with her son now. She is under hospice now for her heart condition.  Glucometer: One Touch  ultra  Pt's meals are: - Breakfast: cereal (Rice crispies) + 2% mild + toast + apple sauce + diet pepsi - Lunch: cheese or ham sandwich with tomatoes + diet pepsi  - Dinner: meat + 2 veggies + sometimes dessert + diet pepsi - Snacks: rice crispies + icecream bedtime - every other night; cheese puffs  -+ CKD, last BUN/creatinine:  Lab Results  Component Value Date   BUN 40 (H) 11/28/2018   BUN 41 (H) 10/08/2018   CREATININE 1.98 (H) 11/28/2018   CREATININE 1.71 (H) 10/08/2018   -+ HL; last set of lipids: Lab Results  Component Value Date   CHOL 169 03/22/2018   HDL 30.70 (L) 03/22/2018   LDLCALC 88 11/21/2017   LDLDIRECT 122.0 03/22/2018   TRIG 234.0 (H) 03/22/2018   CHOLHDL 6 03/22/2018  On Lipitor.  - last eye exam was in 11/2017: No DR but macular degeneration in her left eye. Dr. Bing Plume.  - + numbness and tingling, and also pain in her feet -she usually puts ice on her feet at night.  She is on Neurontin but this is used for her tremors. She takes 2 tabs at bedtime. At last visit I suggested alpha-lipoic acid and B complex, which she started and feels that they help.  She also has a history of postsurgical  hypothyroidism:  Pt is on levothyroxine 125 mcg daily, taken: - in am (7:30 am) - fasting - at least 30 min from b'fast - no Ca, Fe, MVI - + PPI after b'fast! - not on Biotin  Latest TSH was normal: Lab Results  Component Value Date   TSH 3.56 03/22/2018   Her son (my pt) has a history of thyroid cancer.  ROS: Constitutional: no weight gain/no weight loss, no fatigue, no subjective hyperthermia, no subjective hypothermia Eyes: no blurry vision, no xerophthalmia ENT: no sore throat, no nodules palpated in neck, no dysphagia, no odynophagia, no hoarseness Cardiovascular: no CP/no SOB/no palpitations/no leg swelling Respiratory: no cough/no SOB/no wheezing Gastrointestinal: no N/no V/no D/no C/no acid reflux Musculoskeletal: no muscle aches/no joint  aches Skin: no rashes, no hair loss Neurological: no tremors/+ numbness/+ tingling/no dizziness  I reviewed pt's medications, allergies, PMH, social hx, family hx, and changes were documented in the history of present illness. Otherwise, unchanged from my initial visit note.  Past Medical History:  Diagnosis Date  . A-fib ([place])   . Abnormal involuntary movements(781.0)   . Anemia   . Anxiety   . Anxiety state, unspecified   . Arthritis    body, neck  . Benign hypertensive heart disease   . Benign hypertensive heart disease with congestive heart failure ([place])   . Bladder disorder    over active  . Cancer ([place])    skin, removed  . Cervicalgia   . CHF (congestive heart failure) ([place])   . Chronic kidney disease, stage III (moderate) ([facility])   . Congestive heart failure, unspecified   . Contact dermatitis and other eczema, due to unspecified cause   . Corns and callosities   . Diabetes mellitus without complication ([place])   . Diarrhea   . Dyspnea 02/18/2015  . Dysuria 10/01/2018  . Edema   . Esophageal stricture   . Gastroparesis   . Gastroparesis   . GERD (gastroesophageal reflux disease)   . Hiatal hernia   . Hyperlipidemia   . Inflamed seborrheic keratosis   . Inflammatory disease of breast   . Irregular heartbeat   . Lumbago   . Mixed incontinence urge and stress (female)(female)   . Nausea alone   . Nonspecific (abnormal) findings on radiological and other examination of skull and head   . Osteoarthrosis, unspecified whether generalized or localized, unspecified site   . Other and unspecified hyperlipidemia   . Other B-complex deficiencies   . Other functional disorder of bladder   . Other malaise and fatigue   . Other specified visual disturbances   . Pain in joint, lower leg   . Palpitations   . Peripheral vascular disease, unspecified ([place])   . Postmenopausal atrophic vaginitis   . Reflux esophagitis   . Seborrheic keratosis   . Sleep apnea   . Spasm of muscle    . Spinal stenosis   . Spinal stenosis, unspecified region other than cervical   . Stricture and stenosis of esophagus   . Stroke ([place])   . TIA (transient ischemic attack) 2012  . Transient ischemic attack (TIA), and cerebral infarction without residual deficits(V12.54)   . Type II or unspecified type diabetes mellitus with renal manifestations, not stated as uncontrolled(250.40)   . Unspecified constipation   . Unspecified essential hypertension   . Unspecified hereditary and idiopathic peripheral neuropathy   . Unspecified hypothyroidism   . Unspecified pruritic disorder   . Unspecified sleep apnea   . Unspecified vitamin D deficiency   .  Urinary tract infection, site not specified   . Vitamin B12 deficiency    Past Surgical History:  Procedure Laterality Date  . ABDOMINAL HYSTERECTOMY    . APPENDECTOMY    . BACK SURGERY     x 2  . CESAREAN SECTION    . CHOLECYSTECTOMY  1991  . esophageal  2004,2006   dilation, Dr Lyla Son  . [place]   bilateral cataract  . hammer toes    . HERNIA REPAIR  1984aaaaaaaa  . SKIN CANCER DESTRUCTION    . THYROIDECTOMY, PARTIAL  1965   Social History   Socioeconomic History  . Marital status: Widowed    Spouse name: Not on file  . Number of children: 3  . Years of education: Not on file  . Highest education level: Not on file  Occupational History  . Occupation: retired  Scientific laboratory technician  . Financial resource strain: Not hard at all  . Food insecurity    Worry: Never true    Inability: Never true  . Transportation needs    Medical: No    Non-medical: No  Tobacco Use  . Smoking status: Never Smoker  . Smokeless tobacco: Never Used  Substance and Sexual Activity  . Alcohol use: No  . Drug use: No  . Sexual activity: Never  Lifestyle  . Physical activity    Days per week: 0 days    Minutes per session: 0 min  . Stress: Rather much  Relationships  . Social connections    Talks on phone: More than three times a  week    Gets together: More than three times a week    Attends religious service: More than 4 times per year    Active member of club or organization: Yes    Attends meetings of clubs or organizations: Never    Relationship status: Widowed  . Intimate partner violence    Fear of current or ex partner: No    Emotionally abused: No    Physically abused: No    Forced sexual activity: No  Other Topics Concern  . Not on file  Social History Narrative  . Not on file   Current Outpatient Medications on File Prior to Visit  Medication Sig Dispense Refill  . Alpha-Lipoic Acid 600 MG TABS Take 1 tablet by mouth 2 (two) times a day.    . ALPRAZolam (XANAX) 0.5 MG tablet TAKE 1 TABLET BY MOUTH THREE TIMES A DAY AS NEEDED 90 tablet 1  . amLODipine (NORVASC) 2.5 MG tablet Take 1 tablet (2.5 mg total) by mouth daily. 90 tablet 1  . aspirin EC 81 MG EC tablet Take 1 tablet (81 mg total) by mouth daily.    Marland Kitchen atorvastatin (LIPITOR) 10 MG tablet Take 1 tablet (10 mg total) by mouth daily at 6 PM. 90 tablet 1  . b complex vitamins tablet Take 1 tablet by mouth daily.    . Cholecalciferol (VITAMIN D3) 1.25 MG (50000 UT) TABS Take 1 tablet by mouth every 7 (seven) days. every Monday. 12 tablet 3  . CRANBERRY PO Take 300 mg by mouth daily.    . [facility] NATURAL LUTEIN EYE HEALTH PO Take 1 tablet by mouth daily.     . diclofenac sodium (VOLTAREN) 1 % GEL Apply 4 g topically 4 (four) times daily. To bilateral knees 100 g 5  . furosemide (LASIX) 40 MG tablet TAKE 1 TABLET BY MOUTH DAILY TO CONTROL EDEMA 90 tablet 1  .  gabapentin (NEURONTIN) 100 MG capsule TAKE TWO CAPSULES BY MOUTH AT BEDTIME TO HELP WITH TREMORS    . glucagon (GLUCAGON EMERGENCY) 1 MG injection Inject 1 mg into the muscle once as needed for up to 1 dose. 1 each 12  . HYDROcodone-acetaminophen (NORCO) 7.5-325 MG tablet Take 1 tablet by mouth every 6 (six) hours as needed for moderate pain. 120 tablet 0  . Insulin Glargine (LANTUS SOLOSTAR) 100  UNIT/ML Solostar Pen Inject 28 Units into the skin at bedtime. 15 mL 1  . insulin lispro (HUMALOG KWIKPEN) 100 UNIT/ML KwikPen Inject 10 units at breakfast, 16 units at lunch and 16 units at dinner    . Insulin Pen Needle (B-D ULTRAFINE III SHORT PEN) 31G X 8 MM MISC Use with administering insulin. Dx. E11.29 100 each 11  . isosorbide mononitrate (IMDUR) 30 MG 24 hr tablet Take 1 tablet (30 mg total) by mouth daily. 90 tablet 1  . levothyroxine (SYNTHROID) 125 MCG tablet Take 1 tablet (125 mcg total) by mouth daily before breakfast. 90 tablet 0  . metoprolol tartrate (LOPRESSOR) 25 MG tablet Take 0.5 tablets (12.5 mg total) by mouth 2 (two) times daily. 90 tablet 1  . Morphine Sulfate (MORPHINE CONCENTRATE) 10 MG/0.5ML SOLN concentrated solution Place 0.5 mLs (10 mg total) under the tongue every 4 (four) hours as needed for severe pain (Chest pain or anginal equivalent). 15 mL 0  . Multiple Vitamins-Minerals (PRESERVISION AREDS 2 PO) Take 1 tablet by mouth 2 (two) times daily.     . nitroGLYCERIN (NITROSTAT) 0.4 MG SL tablet Place 1 tablet (0.4 mg total) under the tongue every 5 (five) minutes as needed for chest pain. 25 tablet 12  . omeprazole (PRILOSEC) 40 MG capsule Take 1 capsule (40 mg total) by mouth daily. TAKE ONE (1) CAPSULE BY MOUTH 2 TIMES DAILY FOR STOMACH 180 capsule 1  . ondansetron (ZOFRAN) 4 MG tablet TAKE ONE TABLET BY MOUTH EVERY 4 HOURS AS NEEDED FOR NAUSEA/VOMITING 30 tablet 0  . ONE TOUCH ULTRA TEST test strip Use to test sugar three times daily dx E11.29 100 each 6  . Polyethyl Glycol-Propyl Glycol (SYSTANE OP) Place 1 drop into both eyes 2 (two) times daily.     Marland Kitchen senna (SENOKOT) 8.6 MG TABS tablet Take 1 tablet by mouth daily as needed for mild constipation.    . triamcinolone cream (KENALOG) 0.1 % Apply 1 application topically as needed. Mix with CeraVe cream apply twice daily for dry skin    . trimethoprim (TRIMPEX) 100 MG tablet TAKE ONE TABLET BY MOUTH EVERY NIGHT AT  BEDTIME TO PREVENT BLADDER INFECTION. 90 tablet 1  . VITAMIN E PO Take 400 Units by mouth daily.     No current facility-administered medications on file prior to visit.    Allergies  Allergen Reactions  . Celebrex [Celecoxib] Rash  . Cymbalta [Duloxetine Hcl] Rash    Lip numbness,rash, leg and body jerks. Patient states " I though I was having a heart attack or stroke."   . Doxycycline Rash  . Lyrica [Pregabalin] Rash  . Avandia [Rosiglitazone] Rash  . Macrodantin [Nitrofurantoin Macrocrystal] Rash  . Silicone Rash    Gets rash from the touch it.  . Sulfa Antibiotics Rash  . Vioxx [Rofecoxib] Rash   Family History  Problem Relation Age of Onset  . Cancer Mother        ? stomach  . Heart disease Father   . Hyperlipidemia Father   . Hypertension Father   .  Heart attack Father   . Heart disease Brother   . Kidney disease Daughter   . Heart disease Brother   . Cancer Son   Daughter has a history of skin cancer while son has a history of thyroid cancer.  PE: BP 120/60   Pulse 83   Ht 5\' 3"  (1.6 m)   Wt 136 lb (61.7 kg) Comment: per patient  SpO2 96%   BMI 24.09 kg/m  Wt Readings from Last 3 Encounters:  03/21/19 136 lb (61.7 kg)  03/05/19 138 lb (62.6 kg)  11/27/18 140 lb (63.5 kg)   Constitutional: normal weight, in NAD Eyes: PERRLA, EOMI, no exophthalmos ENT: moist mucous membranes, no thyromegaly, no cervical lymphadenopathy Cardiovascular: RRR, No MRG Respiratory: CTA B Gastrointestinal: abdomen soft, NT, ND, BS+ Musculoskeletal: no deformities, strength intact in all 4 Skin: moist, warm, no rashes Neurological: no tremor with outstretched hands, DTR normal in all 4  ASSESSMENT: 1. DM2, insulin-dependent, uncontrolled, with long-term complications - CKD - PN - macular degeneration  2. HL  3. Hypothyroidism  PLAN:  1. Patient with longstanding, uncontrolled, type 2 diabetes, on basal/bolus insulin regimen, with history of low blood sugar episode in  the morning when she was on higher doses of Lantus.  We decreased the doses and she did not have any recent low blood sugars.  At the previous visit, she was drinking sugary drinks with every meal and I strongly advised her to stop these.  Also, she was eating rice crispies and ice cream after dinner and we discussed about replacing these with healthier snacks. -At last visit, her daughter-in-law was telling me that her sugars were lower mostly in the morning and before dinner (50s to 80s range).  We reduced her Lantus dose at that time and continue the same dose of Humalog.  They were also wondering about possibly switching to NPH but I suggested against this if the price is not a problem.  I explained that NPH has a high propensity for lower blood sugars and in general more fluctuating CBGs and it is also twice a day insulin. -At last visit she was having more neuropathic symptoms and I recommended alpha-lipoic acid and B complex.  She started these and they helped. -At this visit, he tells me that her insulin doses were reduced and the sugars appear to be at goal on the current regimen with occasional low blood sugars in the morning.  We will reduce her Lantus even more now.  She did have a high blood sugar in the 300s after her steroid injection yesterday.  She is usually increasing her dose of Humalog when she gets a steroid injection.  However, sugars stay high for approximately 2 days afterwards. - I suggested to:  Patient Instructions  Please continue: - Humalog 6-8-8 units before each meal  Please decrease: - Lantus 13-14 units at bedtime  Continue the following combination for neuropathy: - alpha-lipoic acid 600 mg 2x a day - B complex 1 tablet a day  Move the Omeprazole before lunch.  Stop B complex 2 days before next visit.  Take the thyroid hormone every day, with water, at least 30 minutes before breakfast, separated by at least 4 hours from: - acid reflux medications - calcium -  iron - multivitamins  Please return in 3-4 months with your sugar log.   - CBG targets for treatment: 90-150 mg/dL before meals and 200mg /dL after meals; target HbA1c <7.5% -due to age. - we checked her HbA1c: 6.1% (  better) - advised to check sugars at different times of the day - 3-4x a day, rotating check times - advised for yearly eye exams >> she is not UTD - return to clinic in 3-4 months  2. HL -Reviewed latest lipid panel from last year: LDL higher than goal, triglycerides high, HDL low Lab Results  Component Value Date   CHOL 169 03/22/2018   HDL 30.70 (L) 03/22/2018   LDLCALC 88 11/21/2017   LDLDIRECT 122.0 03/22/2018   TRIG 234.0 (H) 03/22/2018   CHOLHDL 6 03/22/2018  -She is not on a statin -she needs a repeat lipid panel  3. Hypothyroidism - latest thyroid labs reviewed with pt >> normal  - she continues on LT4 125 mcg daily - pt feels good on this dose. - we discussed about taking the thyroid hormone every day, with water, >30 minutes before breakfast, separated by >4 hours from acid reflux medications, calcium, iron, multivitamins. Pt. Is not taking it correctly; takes omeprazole approximately 30 minutes after levothyroxine.  I advised him to separated by at least 4 hours (will be delayed lunch).  She takes a second dose of PPI in the evening. -I would like to check a TSH today, but we could not do this for 3 reasons: She was taking omeprazole too close to the levothyroxine, she took B complex (containing biotin) either today or yesterday-they cannot remember, and also she had a steroid injection yesterday.  We discussed that she would need to be off B complex for 2 days before next set of labs which could be in 1.5 months or at last visit, which ever easier for them.  Office Visit on 03/21/2019  Component Date Value Ref Range Status  . Glucose, Bld 03/21/2019 300* 65 - 99 mg/dL Final   Comment: .            Fasting reference interval . For someone without known  diabetes, a glucose value >125 mg/dL indicates that they may have diabetes and this should be confirmed with a follow-up test. .   . BUN 03/21/2019 43* 7 - 25 mg/dL Final  . Creat 03/21/2019 2.04* 0.60 - 0.88 mg/dL Final   Comment: For patients >77 years of age, the reference limit for Creatinine is approximately 13% higher for people identified as African-American. .   . GFR, Est Non African American 03/21/2019 21* > OR = 60 mL/min/1.68m2 Final  . GFR, Est African American 03/21/2019 24* > OR = 60 mL/min/1.2m2 Final  . BUN/Creatinine Ratio 03/21/2019 21  6 - 22 (calc) Final  . Sodium 03/21/2019 139  135 - 146 mmol/L Final  . Potassium 03/21/2019 4.8  3.5 - 5.3 mmol/L Final  . Chloride 03/21/2019 98  98 - 110 mmol/L Final  . CO2 03/21/2019 28  20 - 32 mmol/L Final  . Calcium 03/21/2019 9.5  8.6 - 10.4 mg/dL Final  . Total Protein 03/21/2019 6.5  6.1 - 8.1 g/dL Final  . Albumin 03/21/2019 4.0  3.6 - 5.1 g/dL Final  . Globulin 03/21/2019 2.5  1.9 - 3.7 g/dL (calc) Final  . AG Ratio 03/21/2019 1.6  1.0 - 2.5 (calc) Final  . Total Bilirubin 03/21/2019 0.5  0.2 - 1.2 mg/dL Final  . Alkaline phosphatase (APISO) 03/21/2019 93  37 - 153 U/L Final  . AST 03/21/2019 18  10 - 35 U/L Final  . ALT 03/21/2019 21  6 - 29 U/L Final  . Cholesterol 03/21/2019 150  0 - 200 mg/dL Final   ATP  III Classification       Desirable:  < 200 mg/dL               Borderline High:  200 - 239 mg/dL          High:  > = 240 mg/dL  . Triglycerides 03/21/2019 88.0  0.0 - 149.0 mg/dL Final   Normal:  <150 mg/dLBorderline High:  150 - 199 mg/dL  . HDL 03/21/2019 41.40  >39.00 mg/dL Final  . VLDL 03/21/2019 17.6  0.0 - 40.0 mg/dL Final  . LDL Cholesterol 03/21/2019 91  0 - 99 mg/dL Final  . Total CHOL/HDL Ratio 03/21/2019 4   Final                  Men          Women1/2 Average Risk     3.4          3.3Average Risk          5.0          4.42X Average Risk          9.6          7.13X Average Risk          15.0           11.0                      . NonHDL 03/21/2019 109.03   Final   NOTE:  Non-HDL goal should be 30 mg/dL higher than patient's LDL goal (i.e. LDL goal of < 70 mg/dL, would have non-HDL goal of < 100 mg/dL)  . Hemoglobin A1C 03/21/2019 6.1* 4.0 - 5.6 % Final   Msg sent: Dear Ms. Alroy Dust, Your glucose level was quite high, at 300, and also, your kidney function was low.  I am not sure if you are seeing a kidney doctor, but seeing one may be a good idea, since your kidney function has worsened in the last 5 months. Your cholesterol levels are excellent. Sincerely, Philemon Kingdom MD  Philemon Kingdom, MD PhD [facility] Endocrin[facility]

## 2019-03-22 LAB — COMPLETE METABOLIC PANEL WITH GFR
AG Ratio: 1.6 (calc) (ref 1.0–2.5)
ALT: 21 U/L (ref 6–29)
AST: 18 U/L (ref 10–35)
Albumin: 4 g/dL (ref 3.6–5.1)
Alkaline phosphatase (APISO): 93 U/L (ref 37–153)
BUN/Creatinine Ratio: 21 (calc) (ref 6–22)
BUN: 43 mg/dL — ABNORMAL HIGH (ref 7–25)
CO2: 28 mmol/L (ref 20–32)
Calcium: 9.5 mg/dL (ref 8.6–10.4)
Chloride: 98 mmol/L (ref 98–110)
Creat: 2.04 mg/dL — ABNORMAL HIGH (ref 0.60–0.88)
GFR, Est African American: 24 mL/min/{1.73_m2} — ABNORMAL LOW (ref >=60)
GFR, Est Non African American: 21 mL/min/{1.73_m2} — ABNORMAL LOW (ref >=60)
Globulin: 2.5 g/dL (calc) (ref 1.9–3.7)
Glucose, Bld: 300 mg/dL — ABNORMAL HIGH (ref 65–99)
Potassium: 4.8 mmol/L (ref 3.5–5.3)
Sodium: 139 mmol/L (ref 135–146)
Total Bilirubin: 0.5 mg/dL (ref 0.2–1.2)
Total Protein: 6.5 g/dL (ref 6.1–8.1)

## 2019-03-23 ENCOUNTER — Other Ambulatory Visit: Payer: Self-pay | Admitting: Internal Medicine

## 2019-03-25 DIAGNOSIS — I213 ST elevation (STEMI) myocardial infarction of unspecified site: Secondary | ICD-10-CM | POA: Diagnosis not present

## 2019-03-25 DIAGNOSIS — I130 Hypertensive heart and chronic kidney disease with heart failure and stage 1 through stage 4 chronic kidney disease, or unspecified chronic kidney disease: Secondary | ICD-10-CM | POA: Diagnosis not present

## 2019-03-25 DIAGNOSIS — I509 Heart failure, unspecified: Secondary | ICD-10-CM | POA: Diagnosis not present

## 2019-03-25 DIAGNOSIS — E1122 Type 2 diabetes mellitus with diabetic chronic kidney disease: Secondary | ICD-10-CM | POA: Diagnosis not present

## 2019-03-25 DIAGNOSIS — N184 Chronic kidney disease, stage 4 (severe): Secondary | ICD-10-CM | POA: Diagnosis not present

## 2019-03-25 DIAGNOSIS — D631 Anemia in chronic kidney disease: Secondary | ICD-10-CM | POA: Diagnosis not present

## 2019-03-26 DIAGNOSIS — N184 Chronic kidney disease, stage 4 (severe): Secondary | ICD-10-CM | POA: Diagnosis not present

## 2019-03-26 DIAGNOSIS — I213 ST elevation (STEMI) myocardial infarction of unspecified site: Secondary | ICD-10-CM | POA: Diagnosis not present

## 2019-03-26 DIAGNOSIS — I130 Hypertensive heart and chronic kidney disease with heart failure and stage 1 through stage 4 chronic kidney disease, or unspecified chronic kidney disease: Secondary | ICD-10-CM | POA: Diagnosis not present

## 2019-03-26 DIAGNOSIS — I509 Heart failure, unspecified: Secondary | ICD-10-CM | POA: Diagnosis not present

## 2019-03-26 DIAGNOSIS — E1122 Type 2 diabetes mellitus with diabetic chronic kidney disease: Secondary | ICD-10-CM | POA: Diagnosis not present

## 2019-03-26 DIAGNOSIS — D631 Anemia in chronic kidney disease: Secondary | ICD-10-CM | POA: Diagnosis not present

## 2019-03-27 ENCOUNTER — Other Ambulatory Visit: Payer: Self-pay

## 2019-03-27 DIAGNOSIS — I509 Heart failure, unspecified: Secondary | ICD-10-CM | POA: Diagnosis not present

## 2019-03-27 DIAGNOSIS — D631 Anemia in chronic kidney disease: Secondary | ICD-10-CM | POA: Diagnosis not present

## 2019-03-27 DIAGNOSIS — E1122 Type 2 diabetes mellitus with diabetic chronic kidney disease: Secondary | ICD-10-CM | POA: Diagnosis not present

## 2019-03-27 DIAGNOSIS — N184 Chronic kidney disease, stage 4 (severe): Secondary | ICD-10-CM | POA: Diagnosis not present

## 2019-03-27 DIAGNOSIS — I213 ST elevation (STEMI) myocardial infarction of unspecified site: Secondary | ICD-10-CM | POA: Diagnosis not present

## 2019-03-27 DIAGNOSIS — I130 Hypertensive heart and chronic kidney disease with heart failure and stage 1 through stage 4 chronic kidney disease, or unspecified chronic kidney disease: Secondary | ICD-10-CM | POA: Diagnosis not present

## 2019-03-27 NOTE — Telephone Encounter (Signed)
Last office visit 03/05/2019 for neuropathy.  Last refilled 12/06/2018 07/17/2018 for #180 with 1 refill.  No future appointments with PCP.

## 2019-03-27 NOTE — Telephone Encounter (Signed)
Karen [name] with [facility] left v/m requesting 30 day refill for gabapentin for pt.[facility].

## 2019-03-28 DIAGNOSIS — I509 Heart failure, unspecified: Secondary | ICD-10-CM | POA: Diagnosis not present

## 2019-03-28 DIAGNOSIS — D631 Anemia in chronic kidney disease: Secondary | ICD-10-CM | POA: Diagnosis not present

## 2019-03-28 DIAGNOSIS — I130 Hypertensive heart and chronic kidney disease with heart failure and stage 1 through stage 4 chronic kidney disease, or unspecified chronic kidney disease: Secondary | ICD-10-CM | POA: Diagnosis not present

## 2019-03-28 DIAGNOSIS — I213 ST elevation (STEMI) myocardial infarction of unspecified site: Secondary | ICD-10-CM | POA: Diagnosis not present

## 2019-03-28 DIAGNOSIS — E1122 Type 2 diabetes mellitus with diabetic chronic kidney disease: Secondary | ICD-10-CM | POA: Diagnosis not present

## 2019-03-28 DIAGNOSIS — N184 Chronic kidney disease, stage 4 (severe): Secondary | ICD-10-CM | POA: Diagnosis not present

## 2019-03-28 MED ORDER — GABAPENTIN 100 MG PO CAPS: ORAL_CAPSULE | ORAL | 0 refills | Status: DC

## 2019-04-01 DIAGNOSIS — N184 Chronic kidney disease, stage 4 (severe): Secondary | ICD-10-CM | POA: Diagnosis not present

## 2019-04-01 DIAGNOSIS — I213 ST elevation (STEMI) myocardial infarction of unspecified site: Secondary | ICD-10-CM | POA: Diagnosis not present

## 2019-04-01 DIAGNOSIS — D631 Anemia in chronic kidney disease: Secondary | ICD-10-CM | POA: Diagnosis not present

## 2019-04-01 DIAGNOSIS — E1122 Type 2 diabetes mellitus with diabetic chronic kidney disease: Secondary | ICD-10-CM | POA: Diagnosis not present

## 2019-04-01 DIAGNOSIS — I509 Heart failure, unspecified: Secondary | ICD-10-CM | POA: Diagnosis not present

## 2019-04-01 DIAGNOSIS — I130 Hypertensive heart and chronic kidney disease with heart failure and stage 1 through stage 4 chronic kidney disease, or unspecified chronic kidney disease: Secondary | ICD-10-CM | POA: Diagnosis not present

## 2019-04-02 DIAGNOSIS — L03115 Cellulitis of right lower limb: Secondary | ICD-10-CM | POA: Diagnosis not present

## 2019-04-02 DIAGNOSIS — E785 Hyperlipidemia, unspecified: Secondary | ICD-10-CM | POA: Diagnosis not present

## 2019-04-02 DIAGNOSIS — I509 Heart failure, unspecified: Secondary | ICD-10-CM | POA: Diagnosis not present

## 2019-04-02 DIAGNOSIS — K222 Esophageal obstruction: Secondary | ICD-10-CM | POA: Diagnosis not present

## 2019-04-02 DIAGNOSIS — Z87440 Personal history of urinary (tract) infections: Secondary | ICD-10-CM | POA: Diagnosis not present

## 2019-04-02 DIAGNOSIS — F419 Anxiety disorder, unspecified: Secondary | ICD-10-CM | POA: Diagnosis not present

## 2019-04-02 DIAGNOSIS — N184 Chronic kidney disease, stage 4 (severe): Secondary | ICD-10-CM | POA: Diagnosis not present

## 2019-04-02 DIAGNOSIS — E039 Hypothyroidism, unspecified: Secondary | ICD-10-CM | POA: Diagnosis not present

## 2019-04-02 DIAGNOSIS — H3530 Unspecified macular degeneration: Secondary | ICD-10-CM | POA: Diagnosis not present

## 2019-04-02 DIAGNOSIS — S91301D Unspecified open wound, right foot, subsequent encounter: Secondary | ICD-10-CM | POA: Diagnosis not present

## 2019-04-02 DIAGNOSIS — D631 Anemia in chronic kidney disease: Secondary | ICD-10-CM | POA: Diagnosis not present

## 2019-04-02 DIAGNOSIS — I69818 Other symptoms and signs involving cognitive functions following other cerebrovascular disease: Secondary | ICD-10-CM | POA: Diagnosis not present

## 2019-04-02 DIAGNOSIS — H04129 Dry eye syndrome of unspecified lacrimal gland: Secondary | ICD-10-CM | POA: Diagnosis not present

## 2019-04-02 DIAGNOSIS — I4891 Unspecified atrial fibrillation: Secondary | ICD-10-CM | POA: Diagnosis not present

## 2019-04-02 DIAGNOSIS — E1151 Type 2 diabetes mellitus with diabetic peripheral angiopathy without gangrene: Secondary | ICD-10-CM | POA: Diagnosis not present

## 2019-04-02 DIAGNOSIS — I25119 Atherosclerotic heart disease of native coronary artery with unspecified angina pectoris: Secondary | ICD-10-CM | POA: Diagnosis not present

## 2019-04-02 DIAGNOSIS — Z993 Dependence on wheelchair: Secondary | ICD-10-CM | POA: Diagnosis not present

## 2019-04-02 DIAGNOSIS — K219 Gastro-esophageal reflux disease without esophagitis: Secondary | ICD-10-CM | POA: Diagnosis not present

## 2019-04-02 DIAGNOSIS — I70209 Unspecified atherosclerosis of native arteries of extremities, unspecified extremity: Secondary | ICD-10-CM | POA: Diagnosis not present

## 2019-04-02 DIAGNOSIS — L03116 Cellulitis of left lower limb: Secondary | ICD-10-CM | POA: Diagnosis not present

## 2019-04-02 DIAGNOSIS — E559 Vitamin D deficiency, unspecified: Secondary | ICD-10-CM | POA: Diagnosis not present

## 2019-04-02 DIAGNOSIS — S91302D Unspecified open wound, left foot, subsequent encounter: Secondary | ICD-10-CM | POA: Diagnosis not present

## 2019-04-02 DIAGNOSIS — I130 Hypertensive heart and chronic kidney disease with heart failure and stage 1 through stage 4 chronic kidney disease, or unspecified chronic kidney disease: Secondary | ICD-10-CM | POA: Diagnosis not present

## 2019-04-02 DIAGNOSIS — I213 ST elevation (STEMI) myocardial infarction of unspecified site: Secondary | ICD-10-CM | POA: Diagnosis not present

## 2019-04-02 DIAGNOSIS — E1122 Type 2 diabetes mellitus with diabetic chronic kidney disease: Secondary | ICD-10-CM | POA: Diagnosis not present

## 2019-04-03 ENCOUNTER — Telehealth: Payer: Self-pay

## 2019-04-03 DIAGNOSIS — N184 Chronic kidney disease, stage 4 (severe): Secondary | ICD-10-CM | POA: Diagnosis not present

## 2019-04-03 DIAGNOSIS — G894 Chronic pain syndrome: Secondary | ICD-10-CM

## 2019-04-03 DIAGNOSIS — D631 Anemia in chronic kidney disease: Secondary | ICD-10-CM | POA: Diagnosis not present

## 2019-04-03 DIAGNOSIS — I509 Heart failure, unspecified: Secondary | ICD-10-CM | POA: Diagnosis not present

## 2019-04-03 DIAGNOSIS — E1122 Type 2 diabetes mellitus with diabetic chronic kidney disease: Secondary | ICD-10-CM | POA: Diagnosis not present

## 2019-04-03 DIAGNOSIS — I213 ST elevation (STEMI) myocardial infarction of unspecified site: Secondary | ICD-10-CM | POA: Diagnosis not present

## 2019-04-03 DIAGNOSIS — I130 Hypertensive heart and chronic kidney disease with heart failure and stage 1 through stage 4 chronic kidney disease, or unspecified chronic kidney disease: Secondary | ICD-10-CM | POA: Diagnosis not present

## 2019-04-03 MED ORDER — HYDROCODONE-ACETAMINOPHEN 7.5-325 MG PO TABS: 1.0000 | ORAL_TABLET | Freq: Four times a day (QID) | ORAL | 0 refills | Status: DC | PRN

## 2019-04-03 NOTE — Telephone Encounter (Signed)
Karen [name] with [facility] left v/m requesting refill hydrocodone apap 7.5-325 mg. I see that Karen [name] CMA has already put that request in from pt message dated 04/03/19. Karen [name] also wanted Dr Rometta Emery opinion of pts eligibility to continue hospice services.Karen [name] thinks pt appears to be doing pretty well.Please advise.

## 2019-04-03 NOTE — Telephone Encounter (Signed)
Last office visit 03/05/2019 for Neuropathy.  Last refilled 03/01/2019 for #120 with no refills.  No future appointments.  Hospice Patient.

## 2019-04-03 NOTE — Telephone Encounter (Signed)
Noted  

## 2019-04-03 NOTE — Telephone Encounter (Signed)
I last saw Pt 1 month ago. Pt still tenuous at that time. I will discuss at next OV with patient and family... appt likely in 2 months for pain management.

## 2019-04-03 NOTE — Telephone Encounter (Signed)
See below and let [facility] know

## 2019-04-03 NOTE — Telephone Encounter (Signed)
Spoke with Izora Gala with [facility].  She states Ms. Mithcell is up for re-certification soon and they have to show that patient has declined.  At this point they do not feel Mr. Gruwell has declined in health.  They states they also spoke with Ms. Bohan about this and she states her only concern was that she wouldn't be able to get someone in to help her bath a couple times a week.

## 2019-04-04 DIAGNOSIS — N184 Chronic kidney disease, stage 4 (severe): Secondary | ICD-10-CM | POA: Diagnosis not present

## 2019-04-04 DIAGNOSIS — I130 Hypertensive heart and chronic kidney disease with heart failure and stage 1 through stage 4 chronic kidney disease, or unspecified chronic kidney disease: Secondary | ICD-10-CM | POA: Diagnosis not present

## 2019-04-04 DIAGNOSIS — I509 Heart failure, unspecified: Secondary | ICD-10-CM | POA: Diagnosis not present

## 2019-04-04 DIAGNOSIS — D631 Anemia in chronic kidney disease: Secondary | ICD-10-CM | POA: Diagnosis not present

## 2019-04-04 DIAGNOSIS — E1122 Type 2 diabetes mellitus with diabetic chronic kidney disease: Secondary | ICD-10-CM | POA: Diagnosis not present

## 2019-04-04 DIAGNOSIS — I213 ST elevation (STEMI) myocardial infarction of unspecified site: Secondary | ICD-10-CM | POA: Diagnosis not present

## 2019-04-05 ENCOUNTER — Other Ambulatory Visit: Payer: Self-pay | Admitting: Family Medicine

## 2019-04-05 DIAGNOSIS — E113293 Type 2 diabetes mellitus with mild nonproliferative diabetic retinopathy without macular edema, bilateral: Secondary | ICD-10-CM | POA: Diagnosis not present

## 2019-04-05 DIAGNOSIS — H353133 Nonexudative age-related macular degeneration, bilateral, advanced atrophic without subfoveal involvement: Secondary | ICD-10-CM | POA: Diagnosis not present

## 2019-04-05 DIAGNOSIS — D631 Anemia in chronic kidney disease: Secondary | ICD-10-CM | POA: Diagnosis not present

## 2019-04-05 DIAGNOSIS — N184 Chronic kidney disease, stage 4 (severe): Secondary | ICD-10-CM | POA: Diagnosis not present

## 2019-04-05 DIAGNOSIS — H04123 Dry eye syndrome of bilateral lacrimal glands: Secondary | ICD-10-CM | POA: Diagnosis not present

## 2019-04-05 DIAGNOSIS — Z961 Presence of intraocular lens: Secondary | ICD-10-CM | POA: Diagnosis not present

## 2019-04-05 DIAGNOSIS — I509 Heart failure, unspecified: Secondary | ICD-10-CM | POA: Diagnosis not present

## 2019-04-05 DIAGNOSIS — I213 ST elevation (STEMI) myocardial infarction of unspecified site: Secondary | ICD-10-CM | POA: Diagnosis not present

## 2019-04-05 DIAGNOSIS — I130 Hypertensive heart and chronic kidney disease with heart failure and stage 1 through stage 4 chronic kidney disease, or unspecified chronic kidney disease: Secondary | ICD-10-CM | POA: Diagnosis not present

## 2019-04-05 DIAGNOSIS — E1122 Type 2 diabetes mellitus with diabetic chronic kidney disease: Secondary | ICD-10-CM | POA: Diagnosis not present

## 2019-04-05 LAB — HM DIABETES EYE EXAM

## 2019-04-05 NOTE — Telephone Encounter (Signed)
Karen [name] with [facility] left v/m requesting verbal order to re certify pt for hospice services due to pts age and extensive heart disease.Please advise.

## 2019-04-05 NOTE — Telephone Encounter (Signed)
Left message for Karen [name] giving her verbal orders to re-certify Karen [name] for hospice services due to pts age and extensive heart disease.

## 2019-04-06 NOTE — Assessment & Plan Note (Signed)
Toe pain from neuropathy may better with vitamins.  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  Roxanol prn.. from hospices # pills per month: 120 Last UDS date:04/03/2018 Opioid Treatment Agreement signed (Y/N):04/03/2018 Opioid Treatment Agreement last reviewed with patient:  Y [facility] reviewed this encounter (include red flags):  Y no red flags

## 2019-04-09 ENCOUNTER — Encounter: Payer: Self-pay | Admitting: Family Medicine

## 2019-04-10 DIAGNOSIS — E1122 Type 2 diabetes mellitus with diabetic chronic kidney disease: Secondary | ICD-10-CM | POA: Diagnosis not present

## 2019-04-10 DIAGNOSIS — I213 ST elevation (STEMI) myocardial infarction of unspecified site: Secondary | ICD-10-CM | POA: Diagnosis not present

## 2019-04-10 DIAGNOSIS — I509 Heart failure, unspecified: Secondary | ICD-10-CM | POA: Diagnosis not present

## 2019-04-10 DIAGNOSIS — N184 Chronic kidney disease, stage 4 (severe): Secondary | ICD-10-CM | POA: Diagnosis not present

## 2019-04-10 DIAGNOSIS — D631 Anemia in chronic kidney disease: Secondary | ICD-10-CM | POA: Diagnosis not present

## 2019-04-10 DIAGNOSIS — I130 Hypertensive heart and chronic kidney disease with heart failure and stage 1 through stage 4 chronic kidney disease, or unspecified chronic kidney disease: Secondary | ICD-10-CM | POA: Diagnosis not present

## 2019-04-15 DIAGNOSIS — I509 Heart failure, unspecified: Secondary | ICD-10-CM | POA: Diagnosis not present

## 2019-04-15 DIAGNOSIS — I213 ST elevation (STEMI) myocardial infarction of unspecified site: Secondary | ICD-10-CM | POA: Diagnosis not present

## 2019-04-15 DIAGNOSIS — N184 Chronic kidney disease, stage 4 (severe): Secondary | ICD-10-CM | POA: Diagnosis not present

## 2019-04-15 DIAGNOSIS — D631 Anemia in chronic kidney disease: Secondary | ICD-10-CM | POA: Diagnosis not present

## 2019-04-15 DIAGNOSIS — I130 Hypertensive heart and chronic kidney disease with heart failure and stage 1 through stage 4 chronic kidney disease, or unspecified chronic kidney disease: Secondary | ICD-10-CM | POA: Diagnosis not present

## 2019-04-15 DIAGNOSIS — E1122 Type 2 diabetes mellitus with diabetic chronic kidney disease: Secondary | ICD-10-CM | POA: Diagnosis not present

## 2019-04-16 DIAGNOSIS — I509 Heart failure, unspecified: Secondary | ICD-10-CM | POA: Diagnosis not present

## 2019-04-16 DIAGNOSIS — E1122 Type 2 diabetes mellitus with diabetic chronic kidney disease: Secondary | ICD-10-CM | POA: Diagnosis not present

## 2019-04-16 DIAGNOSIS — D631 Anemia in chronic kidney disease: Secondary | ICD-10-CM | POA: Diagnosis not present

## 2019-04-16 DIAGNOSIS — I213 ST elevation (STEMI) myocardial infarction of unspecified site: Secondary | ICD-10-CM | POA: Diagnosis not present

## 2019-04-16 DIAGNOSIS — I130 Hypertensive heart and chronic kidney disease with heart failure and stage 1 through stage 4 chronic kidney disease, or unspecified chronic kidney disease: Secondary | ICD-10-CM | POA: Diagnosis not present

## 2019-04-16 DIAGNOSIS — N184 Chronic kidney disease, stage 4 (severe): Secondary | ICD-10-CM | POA: Diagnosis not present

## 2019-04-17 DIAGNOSIS — N184 Chronic kidney disease, stage 4 (severe): Secondary | ICD-10-CM | POA: Diagnosis not present

## 2019-04-17 DIAGNOSIS — D631 Anemia in chronic kidney disease: Secondary | ICD-10-CM | POA: Diagnosis not present

## 2019-04-17 DIAGNOSIS — I130 Hypertensive heart and chronic kidney disease with heart failure and stage 1 through stage 4 chronic kidney disease, or unspecified chronic kidney disease: Secondary | ICD-10-CM | POA: Diagnosis not present

## 2019-04-17 DIAGNOSIS — G894 Chronic pain syndrome: Secondary | ICD-10-CM | POA: Diagnosis not present

## 2019-04-17 DIAGNOSIS — E1122 Type 2 diabetes mellitus with diabetic chronic kidney disease: Secondary | ICD-10-CM | POA: Diagnosis not present

## 2019-04-17 DIAGNOSIS — I213 ST elevation (STEMI) myocardial infarction of unspecified site: Secondary | ICD-10-CM | POA: Diagnosis not present

## 2019-04-17 DIAGNOSIS — K219 Gastro-esophageal reflux disease without esophagitis: Secondary | ICD-10-CM | POA: Diagnosis not present

## 2019-04-17 DIAGNOSIS — R2689 Other abnormalities of gait and mobility: Secondary | ICD-10-CM | POA: Diagnosis not present

## 2019-04-17 DIAGNOSIS — M179 Osteoarthritis of knee, unspecified: Secondary | ICD-10-CM | POA: Diagnosis not present

## 2019-04-17 DIAGNOSIS — I509 Heart failure, unspecified: Secondary | ICD-10-CM | POA: Diagnosis not present

## 2019-04-18 DIAGNOSIS — E1122 Type 2 diabetes mellitus with diabetic chronic kidney disease: Secondary | ICD-10-CM | POA: Diagnosis not present

## 2019-04-18 DIAGNOSIS — I509 Heart failure, unspecified: Secondary | ICD-10-CM | POA: Diagnosis not present

## 2019-04-18 DIAGNOSIS — N184 Chronic kidney disease, stage 4 (severe): Secondary | ICD-10-CM | POA: Diagnosis not present

## 2019-04-18 DIAGNOSIS — I130 Hypertensive heart and chronic kidney disease with heart failure and stage 1 through stage 4 chronic kidney disease, or unspecified chronic kidney disease: Secondary | ICD-10-CM | POA: Diagnosis not present

## 2019-04-18 DIAGNOSIS — D631 Anemia in chronic kidney disease: Secondary | ICD-10-CM | POA: Diagnosis not present

## 2019-04-18 DIAGNOSIS — I213 ST elevation (STEMI) myocardial infarction of unspecified site: Secondary | ICD-10-CM | POA: Diagnosis not present

## 2019-04-19 ENCOUNTER — Other Ambulatory Visit: Payer: Self-pay | Admitting: Family Medicine

## 2019-04-22 DIAGNOSIS — I509 Heart failure, unspecified: Secondary | ICD-10-CM | POA: Diagnosis not present

## 2019-04-22 DIAGNOSIS — D631 Anemia in chronic kidney disease: Secondary | ICD-10-CM | POA: Diagnosis not present

## 2019-04-22 DIAGNOSIS — I213 ST elevation (STEMI) myocardial infarction of unspecified site: Secondary | ICD-10-CM | POA: Diagnosis not present

## 2019-04-22 DIAGNOSIS — E1122 Type 2 diabetes mellitus with diabetic chronic kidney disease: Secondary | ICD-10-CM | POA: Diagnosis not present

## 2019-04-22 DIAGNOSIS — I130 Hypertensive heart and chronic kidney disease with heart failure and stage 1 through stage 4 chronic kidney disease, or unspecified chronic kidney disease: Secondary | ICD-10-CM | POA: Diagnosis not present

## 2019-04-22 DIAGNOSIS — N184 Chronic kidney disease, stage 4 (severe): Secondary | ICD-10-CM | POA: Diagnosis not present

## 2019-04-24 DIAGNOSIS — N184 Chronic kidney disease, stage 4 (severe): Secondary | ICD-10-CM | POA: Diagnosis not present

## 2019-04-24 DIAGNOSIS — I509 Heart failure, unspecified: Secondary | ICD-10-CM | POA: Diagnosis not present

## 2019-04-24 DIAGNOSIS — D631 Anemia in chronic kidney disease: Secondary | ICD-10-CM | POA: Diagnosis not present

## 2019-04-24 DIAGNOSIS — I213 ST elevation (STEMI) myocardial infarction of unspecified site: Secondary | ICD-10-CM | POA: Diagnosis not present

## 2019-04-24 DIAGNOSIS — E1122 Type 2 diabetes mellitus with diabetic chronic kidney disease: Secondary | ICD-10-CM | POA: Diagnosis not present

## 2019-04-24 DIAGNOSIS — I130 Hypertensive heart and chronic kidney disease with heart failure and stage 1 through stage 4 chronic kidney disease, or unspecified chronic kidney disease: Secondary | ICD-10-CM | POA: Diagnosis not present

## 2019-04-25 DIAGNOSIS — I213 ST elevation (STEMI) myocardial infarction of unspecified site: Secondary | ICD-10-CM | POA: Diagnosis not present

## 2019-04-25 DIAGNOSIS — E1122 Type 2 diabetes mellitus with diabetic chronic kidney disease: Secondary | ICD-10-CM | POA: Diagnosis not present

## 2019-04-25 DIAGNOSIS — D631 Anemia in chronic kidney disease: Secondary | ICD-10-CM | POA: Diagnosis not present

## 2019-04-25 DIAGNOSIS — N184 Chronic kidney disease, stage 4 (severe): Secondary | ICD-10-CM | POA: Diagnosis not present

## 2019-04-25 DIAGNOSIS — I509 Heart failure, unspecified: Secondary | ICD-10-CM | POA: Diagnosis not present

## 2019-04-25 DIAGNOSIS — I130 Hypertensive heart and chronic kidney disease with heart failure and stage 1 through stage 4 chronic kidney disease, or unspecified chronic kidney disease: Secondary | ICD-10-CM | POA: Diagnosis not present

## 2019-04-26 ENCOUNTER — Other Ambulatory Visit: Payer: Self-pay | Admitting: Family Medicine

## 2019-04-26 NOTE — Telephone Encounter (Signed)
Last office visit 03/05/2019 for 3 month follow up.  Last refilled 03/28/2019 for #60 with no refills. No future appointments with PCP.

## 2019-04-29 DIAGNOSIS — E1122 Type 2 diabetes mellitus with diabetic chronic kidney disease: Secondary | ICD-10-CM | POA: Diagnosis not present

## 2019-04-29 DIAGNOSIS — N184 Chronic kidney disease, stage 4 (severe): Secondary | ICD-10-CM | POA: Diagnosis not present

## 2019-04-29 DIAGNOSIS — D631 Anemia in chronic kidney disease: Secondary | ICD-10-CM | POA: Diagnosis not present

## 2019-04-29 DIAGNOSIS — I509 Heart failure, unspecified: Secondary | ICD-10-CM | POA: Diagnosis not present

## 2019-04-29 DIAGNOSIS — I213 ST elevation (STEMI) myocardial infarction of unspecified site: Secondary | ICD-10-CM | POA: Diagnosis not present

## 2019-04-29 DIAGNOSIS — I130 Hypertensive heart and chronic kidney disease with heart failure and stage 1 through stage 4 chronic kidney disease, or unspecified chronic kidney disease: Secondary | ICD-10-CM | POA: Diagnosis not present

## 2019-05-01 DIAGNOSIS — I509 Heart failure, unspecified: Secondary | ICD-10-CM | POA: Diagnosis not present

## 2019-05-01 DIAGNOSIS — I130 Hypertensive heart and chronic kidney disease with heart failure and stage 1 through stage 4 chronic kidney disease, or unspecified chronic kidney disease: Secondary | ICD-10-CM | POA: Diagnosis not present

## 2019-05-01 DIAGNOSIS — I213 ST elevation (STEMI) myocardial infarction of unspecified site: Secondary | ICD-10-CM | POA: Diagnosis not present

## 2019-05-01 DIAGNOSIS — D631 Anemia in chronic kidney disease: Secondary | ICD-10-CM | POA: Diagnosis not present

## 2019-05-01 DIAGNOSIS — E1122 Type 2 diabetes mellitus with diabetic chronic kidney disease: Secondary | ICD-10-CM | POA: Diagnosis not present

## 2019-05-01 DIAGNOSIS — N184 Chronic kidney disease, stage 4 (severe): Secondary | ICD-10-CM | POA: Diagnosis not present

## 2019-05-02 DIAGNOSIS — I69818 Other symptoms and signs involving cognitive functions following other cerebrovascular disease: Secondary | ICD-10-CM | POA: Diagnosis not present

## 2019-05-02 DIAGNOSIS — I4891 Unspecified atrial fibrillation: Secondary | ICD-10-CM | POA: Diagnosis not present

## 2019-05-02 DIAGNOSIS — E785 Hyperlipidemia, unspecified: Secondary | ICD-10-CM | POA: Diagnosis not present

## 2019-05-02 DIAGNOSIS — I130 Hypertensive heart and chronic kidney disease with heart failure and stage 1 through stage 4 chronic kidney disease, or unspecified chronic kidney disease: Secondary | ICD-10-CM | POA: Diagnosis not present

## 2019-05-02 DIAGNOSIS — E559 Vitamin D deficiency, unspecified: Secondary | ICD-10-CM | POA: Diagnosis not present

## 2019-05-02 DIAGNOSIS — H04129 Dry eye syndrome of unspecified lacrimal gland: Secondary | ICD-10-CM | POA: Diagnosis not present

## 2019-05-02 DIAGNOSIS — I25119 Atherosclerotic heart disease of native coronary artery with unspecified angina pectoris: Secondary | ICD-10-CM | POA: Diagnosis not present

## 2019-05-02 DIAGNOSIS — E1151 Type 2 diabetes mellitus with diabetic peripheral angiopathy without gangrene: Secondary | ICD-10-CM | POA: Diagnosis not present

## 2019-05-02 DIAGNOSIS — I252 Old myocardial infarction: Secondary | ICD-10-CM | POA: Diagnosis not present

## 2019-05-02 DIAGNOSIS — Z993 Dependence on wheelchair: Secondary | ICD-10-CM | POA: Diagnosis not present

## 2019-05-02 DIAGNOSIS — E039 Hypothyroidism, unspecified: Secondary | ICD-10-CM | POA: Diagnosis not present

## 2019-05-02 DIAGNOSIS — I70209 Unspecified atherosclerosis of native arteries of extremities, unspecified extremity: Secondary | ICD-10-CM | POA: Diagnosis not present

## 2019-05-02 DIAGNOSIS — E1122 Type 2 diabetes mellitus with diabetic chronic kidney disease: Secondary | ICD-10-CM | POA: Diagnosis not present

## 2019-05-02 DIAGNOSIS — K222 Esophageal obstruction: Secondary | ICD-10-CM | POA: Diagnosis not present

## 2019-05-02 DIAGNOSIS — N184 Chronic kidney disease, stage 4 (severe): Secondary | ICD-10-CM | POA: Diagnosis not present

## 2019-05-02 DIAGNOSIS — Z9981 Dependence on supplemental oxygen: Secondary | ICD-10-CM | POA: Diagnosis not present

## 2019-05-02 DIAGNOSIS — I509 Heart failure, unspecified: Secondary | ICD-10-CM | POA: Diagnosis not present

## 2019-05-02 DIAGNOSIS — D631 Anemia in chronic kidney disease: Secondary | ICD-10-CM | POA: Diagnosis not present

## 2019-05-02 DIAGNOSIS — Z87440 Personal history of urinary (tract) infections: Secondary | ICD-10-CM | POA: Diagnosis not present

## 2019-05-02 DIAGNOSIS — F419 Anxiety disorder, unspecified: Secondary | ICD-10-CM | POA: Diagnosis not present

## 2019-05-02 DIAGNOSIS — H3530 Unspecified macular degeneration: Secondary | ICD-10-CM | POA: Diagnosis not present

## 2019-05-02 DIAGNOSIS — K219 Gastro-esophageal reflux disease without esophagitis: Secondary | ICD-10-CM | POA: Diagnosis not present

## 2019-05-04 ENCOUNTER — Other Ambulatory Visit: Payer: Self-pay | Admitting: Family Medicine

## 2019-05-04 DIAGNOSIS — D631 Anemia in chronic kidney disease: Secondary | ICD-10-CM | POA: Diagnosis not present

## 2019-05-04 DIAGNOSIS — E1122 Type 2 diabetes mellitus with diabetic chronic kidney disease: Secondary | ICD-10-CM | POA: Diagnosis not present

## 2019-05-04 DIAGNOSIS — E1151 Type 2 diabetes mellitus with diabetic peripheral angiopathy without gangrene: Secondary | ICD-10-CM | POA: Diagnosis not present

## 2019-05-04 DIAGNOSIS — I130 Hypertensive heart and chronic kidney disease with heart failure and stage 1 through stage 4 chronic kidney disease, or unspecified chronic kidney disease: Secondary | ICD-10-CM | POA: Diagnosis not present

## 2019-05-04 DIAGNOSIS — N184 Chronic kidney disease, stage 4 (severe): Secondary | ICD-10-CM | POA: Diagnosis not present

## 2019-05-04 DIAGNOSIS — I509 Heart failure, unspecified: Secondary | ICD-10-CM | POA: Diagnosis not present

## 2019-05-06 DIAGNOSIS — D631 Anemia in chronic kidney disease: Secondary | ICD-10-CM | POA: Diagnosis not present

## 2019-05-06 DIAGNOSIS — I509 Heart failure, unspecified: Secondary | ICD-10-CM | POA: Diagnosis not present

## 2019-05-06 DIAGNOSIS — N184 Chronic kidney disease, stage 4 (severe): Secondary | ICD-10-CM | POA: Diagnosis not present

## 2019-05-06 DIAGNOSIS — E1151 Type 2 diabetes mellitus with diabetic peripheral angiopathy without gangrene: Secondary | ICD-10-CM | POA: Diagnosis not present

## 2019-05-06 DIAGNOSIS — I130 Hypertensive heart and chronic kidney disease with heart failure and stage 1 through stage 4 chronic kidney disease, or unspecified chronic kidney disease: Secondary | ICD-10-CM | POA: Diagnosis not present

## 2019-05-06 DIAGNOSIS — E1122 Type 2 diabetes mellitus with diabetic chronic kidney disease: Secondary | ICD-10-CM | POA: Diagnosis not present

## 2019-05-06 NOTE — Telephone Encounter (Signed)
Last office visit 03/05/2019 for neuropathy due to diabetes.  Last refilled 02/28/2019 for #90 with 1 refills.  No future appointments.

## 2019-05-08 DIAGNOSIS — I509 Heart failure, unspecified: Secondary | ICD-10-CM | POA: Diagnosis not present

## 2019-05-08 DIAGNOSIS — D631 Anemia in chronic kidney disease: Secondary | ICD-10-CM | POA: Diagnosis not present

## 2019-05-08 DIAGNOSIS — E1151 Type 2 diabetes mellitus with diabetic peripheral angiopathy without gangrene: Secondary | ICD-10-CM | POA: Diagnosis not present

## 2019-05-08 DIAGNOSIS — E1122 Type 2 diabetes mellitus with diabetic chronic kidney disease: Secondary | ICD-10-CM | POA: Diagnosis not present

## 2019-05-08 DIAGNOSIS — I130 Hypertensive heart and chronic kidney disease with heart failure and stage 1 through stage 4 chronic kidney disease, or unspecified chronic kidney disease: Secondary | ICD-10-CM | POA: Diagnosis not present

## 2019-05-08 DIAGNOSIS — N184 Chronic kidney disease, stage 4 (severe): Secondary | ICD-10-CM | POA: Diagnosis not present

## 2019-05-09 DIAGNOSIS — I509 Heart failure, unspecified: Secondary | ICD-10-CM | POA: Diagnosis not present

## 2019-05-09 DIAGNOSIS — E1151 Type 2 diabetes mellitus with diabetic peripheral angiopathy without gangrene: Secondary | ICD-10-CM | POA: Diagnosis not present

## 2019-05-09 DIAGNOSIS — D631 Anemia in chronic kidney disease: Secondary | ICD-10-CM | POA: Diagnosis not present

## 2019-05-09 DIAGNOSIS — N184 Chronic kidney disease, stage 4 (severe): Secondary | ICD-10-CM | POA: Diagnosis not present

## 2019-05-09 DIAGNOSIS — E1122 Type 2 diabetes mellitus with diabetic chronic kidney disease: Secondary | ICD-10-CM | POA: Diagnosis not present

## 2019-05-09 DIAGNOSIS — I130 Hypertensive heart and chronic kidney disease with heart failure and stage 1 through stage 4 chronic kidney disease, or unspecified chronic kidney disease: Secondary | ICD-10-CM | POA: Diagnosis not present

## 2019-05-13 DIAGNOSIS — E1122 Type 2 diabetes mellitus with diabetic chronic kidney disease: Secondary | ICD-10-CM | POA: Diagnosis not present

## 2019-05-13 DIAGNOSIS — I509 Heart failure, unspecified: Secondary | ICD-10-CM | POA: Diagnosis not present

## 2019-05-13 DIAGNOSIS — E1151 Type 2 diabetes mellitus with diabetic peripheral angiopathy without gangrene: Secondary | ICD-10-CM | POA: Diagnosis not present

## 2019-05-13 DIAGNOSIS — N184 Chronic kidney disease, stage 4 (severe): Secondary | ICD-10-CM | POA: Diagnosis not present

## 2019-05-13 DIAGNOSIS — I130 Hypertensive heart and chronic kidney disease with heart failure and stage 1 through stage 4 chronic kidney disease, or unspecified chronic kidney disease: Secondary | ICD-10-CM | POA: Diagnosis not present

## 2019-05-13 DIAGNOSIS — D631 Anemia in chronic kidney disease: Secondary | ICD-10-CM | POA: Diagnosis not present

## 2019-05-15 DIAGNOSIS — D631 Anemia in chronic kidney disease: Secondary | ICD-10-CM | POA: Diagnosis not present

## 2019-05-15 DIAGNOSIS — I509 Heart failure, unspecified: Secondary | ICD-10-CM | POA: Diagnosis not present

## 2019-05-15 DIAGNOSIS — E1151 Type 2 diabetes mellitus with diabetic peripheral angiopathy without gangrene: Secondary | ICD-10-CM | POA: Diagnosis not present

## 2019-05-15 DIAGNOSIS — N184 Chronic kidney disease, stage 4 (severe): Secondary | ICD-10-CM | POA: Diagnosis not present

## 2019-05-15 DIAGNOSIS — E1122 Type 2 diabetes mellitus with diabetic chronic kidney disease: Secondary | ICD-10-CM | POA: Diagnosis not present

## 2019-05-15 DIAGNOSIS — I130 Hypertensive heart and chronic kidney disease with heart failure and stage 1 through stage 4 chronic kidney disease, or unspecified chronic kidney disease: Secondary | ICD-10-CM | POA: Diagnosis not present

## 2019-05-16 DIAGNOSIS — D631 Anemia in chronic kidney disease: Secondary | ICD-10-CM | POA: Diagnosis not present

## 2019-05-16 DIAGNOSIS — I509 Heart failure, unspecified: Secondary | ICD-10-CM | POA: Diagnosis not present

## 2019-05-16 DIAGNOSIS — E1122 Type 2 diabetes mellitus with diabetic chronic kidney disease: Secondary | ICD-10-CM | POA: Diagnosis not present

## 2019-05-16 DIAGNOSIS — I130 Hypertensive heart and chronic kidney disease with heart failure and stage 1 through stage 4 chronic kidney disease, or unspecified chronic kidney disease: Secondary | ICD-10-CM | POA: Diagnosis not present

## 2019-05-16 DIAGNOSIS — E1151 Type 2 diabetes mellitus with diabetic peripheral angiopathy without gangrene: Secondary | ICD-10-CM | POA: Diagnosis not present

## 2019-05-16 DIAGNOSIS — N184 Chronic kidney disease, stage 4 (severe): Secondary | ICD-10-CM | POA: Diagnosis not present

## 2019-05-20 ENCOUNTER — Other Ambulatory Visit: Payer: Self-pay

## 2019-05-20 DIAGNOSIS — I509 Heart failure, unspecified: Secondary | ICD-10-CM | POA: Diagnosis not present

## 2019-05-20 DIAGNOSIS — E1122 Type 2 diabetes mellitus with diabetic chronic kidney disease: Secondary | ICD-10-CM | POA: Diagnosis not present

## 2019-05-20 DIAGNOSIS — D631 Anemia in chronic kidney disease: Secondary | ICD-10-CM | POA: Diagnosis not present

## 2019-05-20 DIAGNOSIS — N184 Chronic kidney disease, stage 4 (severe): Secondary | ICD-10-CM | POA: Diagnosis not present

## 2019-05-20 DIAGNOSIS — I130 Hypertensive heart and chronic kidney disease with heart failure and stage 1 through stage 4 chronic kidney disease, or unspecified chronic kidney disease: Secondary | ICD-10-CM | POA: Diagnosis not present

## 2019-05-20 DIAGNOSIS — G894 Chronic pain syndrome: Secondary | ICD-10-CM

## 2019-05-20 DIAGNOSIS — E1151 Type 2 diabetes mellitus with diabetic peripheral angiopathy without gangrene: Secondary | ICD-10-CM | POA: Diagnosis not present

## 2019-05-20 NOTE — Telephone Encounter (Signed)
Izora Gala with [facility] request refill for hydrocodone and alprazolam; advised alprazolam was last sent # 90 on 05/06/19 electronically to [facility]. Izora Gala will ck with family about need for alprazolam and let us know on 05/21/19.  Name of Medication:Hydrocodone apap  7.5-325 mg Name of Pharmacy: [facility] or Written Date and Quantity: # 120 on 04/03/19 Last Office Visit and Type: 03/25/19 FU virtual Next Office Visit and Type:none scheduled  Last Controlled Substance Agreement Date: 03/23/2018 Last UDS:04/03/2018

## 2019-05-21 MED ORDER — ALPRAZOLAM 0.5 MG PO TABS: 0.5000 mg | ORAL_TABLET | Freq: Three times a day (TID) | ORAL | 0 refills | Status: DC | PRN

## 2019-05-21 MED ORDER — HYDROCODONE-ACETAMINOPHEN 7.5-325 MG PO TABS: 1.0000 | ORAL_TABLET | Freq: Four times a day (QID) | ORAL | 0 refills | Status: DC | PRN

## 2019-05-21 NOTE — Telephone Encounter (Signed)
Izora Gala with [facility] left v/m that the previous hydrocodone apap 7.5-325 mg and xanax 0.5 mg prescriptions were written for 15 day supplies but due to covid guidelines hospice is allowing 30 day supplies to be given now. [name] request 30 day supply for hydrocodone apap 7.5-325 mg and xanax 0.5 mg be sent to [facility]. Pt only has few pills left.  Name of Medication: alprazolam 0.5 mg Name of Pharmacy: [facility] or Written Date and Quantity: # 19 on 05/06/19 Last Office Visit and Type: 03/25/19 FU virtual Next Office Visit and Type:none scheduled  Last Controlled Substance Agreement Date: 03/23/2018 Last UDS:04/03/2018

## 2019-05-22 ENCOUNTER — Telehealth: Payer: Self-pay

## 2019-05-22 DIAGNOSIS — N184 Chronic kidney disease, stage 4 (severe): Secondary | ICD-10-CM | POA: Diagnosis not present

## 2019-05-22 DIAGNOSIS — I130 Hypertensive heart and chronic kidney disease with heart failure and stage 1 through stage 4 chronic kidney disease, or unspecified chronic kidney disease: Secondary | ICD-10-CM | POA: Diagnosis not present

## 2019-05-22 DIAGNOSIS — D631 Anemia in chronic kidney disease: Secondary | ICD-10-CM | POA: Diagnosis not present

## 2019-05-22 DIAGNOSIS — E1151 Type 2 diabetes mellitus with diabetic peripheral angiopathy without gangrene: Secondary | ICD-10-CM | POA: Diagnosis not present

## 2019-05-22 DIAGNOSIS — I509 Heart failure, unspecified: Secondary | ICD-10-CM | POA: Diagnosis not present

## 2019-05-22 DIAGNOSIS — E1122 Type 2 diabetes mellitus with diabetic chronic kidney disease: Secondary | ICD-10-CM | POA: Diagnosis not present

## 2019-05-22 NOTE — Telephone Encounter (Signed)
Izora Gala with [facility] left v/m with update of pts status. Pt has some swelling in both feet and the lt foot appears more swollen. Both feet are hurting and the feet hurting wakes pt up at night. There is redness that streaks up leg about 1/4 of shin.Izora Gala request any suggestions for pt.

## 2019-05-23 DIAGNOSIS — E1151 Type 2 diabetes mellitus with diabetic peripheral angiopathy without gangrene: Secondary | ICD-10-CM | POA: Diagnosis not present

## 2019-05-23 DIAGNOSIS — I130 Hypertensive heart and chronic kidney disease with heart failure and stage 1 through stage 4 chronic kidney disease, or unspecified chronic kidney disease: Secondary | ICD-10-CM | POA: Diagnosis not present

## 2019-05-23 DIAGNOSIS — N184 Chronic kidney disease, stage 4 (severe): Secondary | ICD-10-CM | POA: Diagnosis not present

## 2019-05-23 DIAGNOSIS — E1122 Type 2 diabetes mellitus with diabetic chronic kidney disease: Secondary | ICD-10-CM | POA: Diagnosis not present

## 2019-05-23 DIAGNOSIS — D631 Anemia in chronic kidney disease: Secondary | ICD-10-CM | POA: Diagnosis not present

## 2019-05-23 DIAGNOSIS — I509 Heart failure, unspecified: Secondary | ICD-10-CM | POA: Diagnosis not present

## 2019-05-23 NOTE — Telephone Encounter (Signed)
Spoke with Izora Gala.  She states the redness is just on one leg and she is concerned about cellulitis as well.  She states the patient does become tired more easily lately as well.  I advised that she will need to schedule virtual or in office visit tomorrow with Dr. Diona Browner.  Izora Gala will let family know to call and schedule appointment.

## 2019-05-23 NOTE — Telephone Encounter (Signed)
If redness in just one leg... concerning for cellulitis...needs virtual or in person OV.  Also verify no worsened shortness of breath.  Otherwise she can take 1.5 of the 40 mg lasix daily x 3 days, elevate legs above heart, wear compression hose ( I think she may be doing that already)  Verify bedtime gabapentin dose because we could try slightly higher dose at night.

## 2019-05-23 NOTE — Telephone Encounter (Signed)
Noted  

## 2019-05-27 DIAGNOSIS — I509 Heart failure, unspecified: Secondary | ICD-10-CM | POA: Diagnosis not present

## 2019-05-27 DIAGNOSIS — I130 Hypertensive heart and chronic kidney disease with heart failure and stage 1 through stage 4 chronic kidney disease, or unspecified chronic kidney disease: Secondary | ICD-10-CM | POA: Diagnosis not present

## 2019-05-27 DIAGNOSIS — E1122 Type 2 diabetes mellitus with diabetic chronic kidney disease: Secondary | ICD-10-CM | POA: Diagnosis not present

## 2019-05-27 DIAGNOSIS — D631 Anemia in chronic kidney disease: Secondary | ICD-10-CM | POA: Diagnosis not present

## 2019-05-27 DIAGNOSIS — E1151 Type 2 diabetes mellitus with diabetic peripheral angiopathy without gangrene: Secondary | ICD-10-CM | POA: Diagnosis not present

## 2019-05-27 DIAGNOSIS — N184 Chronic kidney disease, stage 4 (severe): Secondary | ICD-10-CM | POA: Diagnosis not present

## 2019-05-29 ENCOUNTER — Telehealth: Payer: Self-pay | Admitting: Family Medicine

## 2019-05-29 DIAGNOSIS — N184 Chronic kidney disease, stage 4 (severe): Secondary | ICD-10-CM | POA: Diagnosis not present

## 2019-05-29 DIAGNOSIS — E1122 Type 2 diabetes mellitus with diabetic chronic kidney disease: Secondary | ICD-10-CM | POA: Diagnosis not present

## 2019-05-29 DIAGNOSIS — I130 Hypertensive heart and chronic kidney disease with heart failure and stage 1 through stage 4 chronic kidney disease, or unspecified chronic kidney disease: Secondary | ICD-10-CM | POA: Diagnosis not present

## 2019-05-29 DIAGNOSIS — I509 Heart failure, unspecified: Secondary | ICD-10-CM | POA: Diagnosis not present

## 2019-05-29 DIAGNOSIS — E1151 Type 2 diabetes mellitus with diabetic peripheral angiopathy without gangrene: Secondary | ICD-10-CM | POA: Diagnosis not present

## 2019-05-29 DIAGNOSIS — D631 Anemia in chronic kidney disease: Secondary | ICD-10-CM | POA: Diagnosis not present

## 2019-05-29 NOTE — Telephone Encounter (Signed)
Left message for Karen [name] giving verbal orders to re certify Karen [name] for [facility].

## 2019-05-29 NOTE — Telephone Encounter (Signed)
Izora Gala with [facility] called and requested verbal orders for recertification of [place] services.   Please advise, thanks.

## 2019-05-30 DIAGNOSIS — N184 Chronic kidney disease, stage 4 (severe): Secondary | ICD-10-CM | POA: Diagnosis not present

## 2019-05-30 DIAGNOSIS — I509 Heart failure, unspecified: Secondary | ICD-10-CM | POA: Diagnosis not present

## 2019-05-30 DIAGNOSIS — D631 Anemia in chronic kidney disease: Secondary | ICD-10-CM | POA: Diagnosis not present

## 2019-05-30 DIAGNOSIS — E1122 Type 2 diabetes mellitus with diabetic chronic kidney disease: Secondary | ICD-10-CM | POA: Diagnosis not present

## 2019-05-30 DIAGNOSIS — I130 Hypertensive heart and chronic kidney disease with heart failure and stage 1 through stage 4 chronic kidney disease, or unspecified chronic kidney disease: Secondary | ICD-10-CM | POA: Diagnosis not present

## 2019-05-30 DIAGNOSIS — E1151 Type 2 diabetes mellitus with diabetic peripheral angiopathy without gangrene: Secondary | ICD-10-CM | POA: Diagnosis not present

## 2019-05-31 ENCOUNTER — Telehealth: Payer: Self-pay | Admitting: Internal Medicine

## 2019-05-31 DIAGNOSIS — E1122 Type 2 diabetes mellitus with diabetic chronic kidney disease: Secondary | ICD-10-CM | POA: Diagnosis not present

## 2019-05-31 DIAGNOSIS — D631 Anemia in chronic kidney disease: Secondary | ICD-10-CM | POA: Diagnosis not present

## 2019-05-31 DIAGNOSIS — I509 Heart failure, unspecified: Secondary | ICD-10-CM | POA: Diagnosis not present

## 2019-05-31 DIAGNOSIS — E1151 Type 2 diabetes mellitus with diabetic peripheral angiopathy without gangrene: Secondary | ICD-10-CM | POA: Diagnosis not present

## 2019-05-31 DIAGNOSIS — N184 Chronic kidney disease, stage 4 (severe): Secondary | ICD-10-CM | POA: Diagnosis not present

## 2019-05-31 DIAGNOSIS — I130 Hypertensive heart and chronic kidney disease with heart failure and stage 1 through stage 4 chronic kidney disease, or unspecified chronic kidney disease: Secondary | ICD-10-CM | POA: Diagnosis not present

## 2019-05-31 NOTE — Telephone Encounter (Signed)
Izora Gala with [facility] ph# [phone] called re: Patient's blood sugar running low and increased neuropathy. Please call the above contact to advise.

## 2019-05-31 NOTE — Telephone Encounter (Signed)
Would you like me to call and get several days of BS readings so you can be more informed? Please advise

## 2019-05-31 NOTE — Telephone Encounter (Signed)
Yes, please and also the exact diabetic regimen she takes

## 2019-06-02 DIAGNOSIS — E039 Hypothyroidism, unspecified: Secondary | ICD-10-CM | POA: Diagnosis not present

## 2019-06-02 DIAGNOSIS — I25119 Atherosclerotic heart disease of native coronary artery with unspecified angina pectoris: Secondary | ICD-10-CM | POA: Diagnosis not present

## 2019-06-02 DIAGNOSIS — I4891 Unspecified atrial fibrillation: Secondary | ICD-10-CM | POA: Diagnosis not present

## 2019-06-02 DIAGNOSIS — E785 Hyperlipidemia, unspecified: Secondary | ICD-10-CM | POA: Diagnosis not present

## 2019-06-02 DIAGNOSIS — D631 Anemia in chronic kidney disease: Secondary | ICD-10-CM | POA: Diagnosis not present

## 2019-06-02 DIAGNOSIS — Z9981 Dependence on supplemental oxygen: Secondary | ICD-10-CM | POA: Diagnosis not present

## 2019-06-02 DIAGNOSIS — I509 Heart failure, unspecified: Secondary | ICD-10-CM | POA: Diagnosis not present

## 2019-06-02 DIAGNOSIS — I252 Old myocardial infarction: Secondary | ICD-10-CM | POA: Diagnosis not present

## 2019-06-02 DIAGNOSIS — K219 Gastro-esophageal reflux disease without esophagitis: Secondary | ICD-10-CM | POA: Diagnosis not present

## 2019-06-02 DIAGNOSIS — E559 Vitamin D deficiency, unspecified: Secondary | ICD-10-CM | POA: Diagnosis not present

## 2019-06-02 DIAGNOSIS — K222 Esophageal obstruction: Secondary | ICD-10-CM | POA: Diagnosis not present

## 2019-06-02 DIAGNOSIS — H3530 Unspecified macular degeneration: Secondary | ICD-10-CM | POA: Diagnosis not present

## 2019-06-02 DIAGNOSIS — Z87440 Personal history of urinary (tract) infections: Secondary | ICD-10-CM | POA: Diagnosis not present

## 2019-06-02 DIAGNOSIS — E1151 Type 2 diabetes mellitus with diabetic peripheral angiopathy without gangrene: Secondary | ICD-10-CM | POA: Diagnosis not present

## 2019-06-02 DIAGNOSIS — I130 Hypertensive heart and chronic kidney disease with heart failure and stage 1 through stage 4 chronic kidney disease, or unspecified chronic kidney disease: Secondary | ICD-10-CM | POA: Diagnosis not present

## 2019-06-02 DIAGNOSIS — I70209 Unspecified atherosclerosis of native arteries of extremities, unspecified extremity: Secondary | ICD-10-CM | POA: Diagnosis not present

## 2019-06-02 DIAGNOSIS — E1122 Type 2 diabetes mellitus with diabetic chronic kidney disease: Secondary | ICD-10-CM | POA: Diagnosis not present

## 2019-06-02 DIAGNOSIS — I69818 Other symptoms and signs involving cognitive functions following other cerebrovascular disease: Secondary | ICD-10-CM | POA: Diagnosis not present

## 2019-06-02 DIAGNOSIS — H04129 Dry eye syndrome of unspecified lacrimal gland: Secondary | ICD-10-CM | POA: Diagnosis not present

## 2019-06-02 DIAGNOSIS — N184 Chronic kidney disease, stage 4 (severe): Secondary | ICD-10-CM | POA: Diagnosis not present

## 2019-06-02 DIAGNOSIS — F419 Anxiety disorder, unspecified: Secondary | ICD-10-CM | POA: Diagnosis not present

## 2019-06-02 DIAGNOSIS — Z993 Dependence on wheelchair: Secondary | ICD-10-CM | POA: Diagnosis not present

## 2019-06-03 DIAGNOSIS — E1122 Type 2 diabetes mellitus with diabetic chronic kidney disease: Secondary | ICD-10-CM | POA: Diagnosis not present

## 2019-06-03 DIAGNOSIS — E1151 Type 2 diabetes mellitus with diabetic peripheral angiopathy without gangrene: Secondary | ICD-10-CM | POA: Diagnosis not present

## 2019-06-03 DIAGNOSIS — N184 Chronic kidney disease, stage 4 (severe): Secondary | ICD-10-CM | POA: Diagnosis not present

## 2019-06-03 DIAGNOSIS — I509 Heart failure, unspecified: Secondary | ICD-10-CM | POA: Diagnosis not present

## 2019-06-03 DIAGNOSIS — I130 Hypertensive heart and chronic kidney disease with heart failure and stage 1 through stage 4 chronic kidney disease, or unspecified chronic kidney disease: Secondary | ICD-10-CM | POA: Diagnosis not present

## 2019-06-03 DIAGNOSIS — D631 Anemia in chronic kidney disease: Secondary | ICD-10-CM | POA: Diagnosis not present

## 2019-06-06 DIAGNOSIS — E1122 Type 2 diabetes mellitus with diabetic chronic kidney disease: Secondary | ICD-10-CM | POA: Diagnosis not present

## 2019-06-06 DIAGNOSIS — I509 Heart failure, unspecified: Secondary | ICD-10-CM | POA: Diagnosis not present

## 2019-06-06 DIAGNOSIS — E1151 Type 2 diabetes mellitus with diabetic peripheral angiopathy without gangrene: Secondary | ICD-10-CM | POA: Diagnosis not present

## 2019-06-06 DIAGNOSIS — N184 Chronic kidney disease, stage 4 (severe): Secondary | ICD-10-CM | POA: Diagnosis not present

## 2019-06-06 DIAGNOSIS — D631 Anemia in chronic kidney disease: Secondary | ICD-10-CM | POA: Diagnosis not present

## 2019-06-06 DIAGNOSIS — I130 Hypertensive heart and chronic kidney disease with heart failure and stage 1 through stage 4 chronic kidney disease, or unspecified chronic kidney disease: Secondary | ICD-10-CM | POA: Diagnosis not present

## 2019-06-10 DIAGNOSIS — D631 Anemia in chronic kidney disease: Secondary | ICD-10-CM | POA: Diagnosis not present

## 2019-06-10 DIAGNOSIS — I130 Hypertensive heart and chronic kidney disease with heart failure and stage 1 through stage 4 chronic kidney disease, or unspecified chronic kidney disease: Secondary | ICD-10-CM | POA: Diagnosis not present

## 2019-06-10 DIAGNOSIS — E1151 Type 2 diabetes mellitus with diabetic peripheral angiopathy without gangrene: Secondary | ICD-10-CM | POA: Diagnosis not present

## 2019-06-10 DIAGNOSIS — N184 Chronic kidney disease, stage 4 (severe): Secondary | ICD-10-CM | POA: Diagnosis not present

## 2019-06-10 DIAGNOSIS — I509 Heart failure, unspecified: Secondary | ICD-10-CM | POA: Diagnosis not present

## 2019-06-10 DIAGNOSIS — E1122 Type 2 diabetes mellitus with diabetic chronic kidney disease: Secondary | ICD-10-CM | POA: Diagnosis not present

## 2019-06-12 DIAGNOSIS — I130 Hypertensive heart and chronic kidney disease with heart failure and stage 1 through stage 4 chronic kidney disease, or unspecified chronic kidney disease: Secondary | ICD-10-CM | POA: Diagnosis not present

## 2019-06-12 DIAGNOSIS — E1122 Type 2 diabetes mellitus with diabetic chronic kidney disease: Secondary | ICD-10-CM | POA: Diagnosis not present

## 2019-06-12 DIAGNOSIS — E1151 Type 2 diabetes mellitus with diabetic peripheral angiopathy without gangrene: Secondary | ICD-10-CM | POA: Diagnosis not present

## 2019-06-12 DIAGNOSIS — D631 Anemia in chronic kidney disease: Secondary | ICD-10-CM | POA: Diagnosis not present

## 2019-06-12 DIAGNOSIS — N184 Chronic kidney disease, stage 4 (severe): Secondary | ICD-10-CM | POA: Diagnosis not present

## 2019-06-12 DIAGNOSIS — I509 Heart failure, unspecified: Secondary | ICD-10-CM | POA: Diagnosis not present

## 2019-06-13 DIAGNOSIS — E1151 Type 2 diabetes mellitus with diabetic peripheral angiopathy without gangrene: Secondary | ICD-10-CM | POA: Diagnosis not present

## 2019-06-13 DIAGNOSIS — I509 Heart failure, unspecified: Secondary | ICD-10-CM | POA: Diagnosis not present

## 2019-06-13 DIAGNOSIS — N184 Chronic kidney disease, stage 4 (severe): Secondary | ICD-10-CM | POA: Diagnosis not present

## 2019-06-13 DIAGNOSIS — E1122 Type 2 diabetes mellitus with diabetic chronic kidney disease: Secondary | ICD-10-CM | POA: Diagnosis not present

## 2019-06-13 DIAGNOSIS — D631 Anemia in chronic kidney disease: Secondary | ICD-10-CM | POA: Diagnosis not present

## 2019-06-13 DIAGNOSIS — I130 Hypertensive heart and chronic kidney disease with heart failure and stage 1 through stage 4 chronic kidney disease, or unspecified chronic kidney disease: Secondary | ICD-10-CM | POA: Diagnosis not present

## 2019-06-14 DIAGNOSIS — I509 Heart failure, unspecified: Secondary | ICD-10-CM | POA: Diagnosis not present

## 2019-06-14 DIAGNOSIS — D631 Anemia in chronic kidney disease: Secondary | ICD-10-CM | POA: Diagnosis not present

## 2019-06-14 DIAGNOSIS — I130 Hypertensive heart and chronic kidney disease with heart failure and stage 1 through stage 4 chronic kidney disease, or unspecified chronic kidney disease: Secondary | ICD-10-CM | POA: Diagnosis not present

## 2019-06-14 DIAGNOSIS — N184 Chronic kidney disease, stage 4 (severe): Secondary | ICD-10-CM | POA: Diagnosis not present

## 2019-06-14 DIAGNOSIS — E1122 Type 2 diabetes mellitus with diabetic chronic kidney disease: Secondary | ICD-10-CM | POA: Diagnosis not present

## 2019-06-14 DIAGNOSIS — E1151 Type 2 diabetes mellitus with diabetic peripheral angiopathy without gangrene: Secondary | ICD-10-CM | POA: Diagnosis not present

## 2019-06-17 DIAGNOSIS — E1151 Type 2 diabetes mellitus with diabetic peripheral angiopathy without gangrene: Secondary | ICD-10-CM | POA: Diagnosis not present

## 2019-06-17 DIAGNOSIS — N184 Chronic kidney disease, stage 4 (severe): Secondary | ICD-10-CM | POA: Diagnosis not present

## 2019-06-17 DIAGNOSIS — D631 Anemia in chronic kidney disease: Secondary | ICD-10-CM | POA: Diagnosis not present

## 2019-06-17 DIAGNOSIS — E1122 Type 2 diabetes mellitus with diabetic chronic kidney disease: Secondary | ICD-10-CM | POA: Diagnosis not present

## 2019-06-17 DIAGNOSIS — I130 Hypertensive heart and chronic kidney disease with heart failure and stage 1 through stage 4 chronic kidney disease, or unspecified chronic kidney disease: Secondary | ICD-10-CM | POA: Diagnosis not present

## 2019-06-17 DIAGNOSIS — I509 Heart failure, unspecified: Secondary | ICD-10-CM | POA: Diagnosis not present

## 2019-06-18 DIAGNOSIS — E1151 Type 2 diabetes mellitus with diabetic peripheral angiopathy without gangrene: Secondary | ICD-10-CM | POA: Diagnosis not present

## 2019-06-18 DIAGNOSIS — I130 Hypertensive heart and chronic kidney disease with heart failure and stage 1 through stage 4 chronic kidney disease, or unspecified chronic kidney disease: Secondary | ICD-10-CM | POA: Diagnosis not present

## 2019-06-18 DIAGNOSIS — I509 Heart failure, unspecified: Secondary | ICD-10-CM | POA: Diagnosis not present

## 2019-06-18 DIAGNOSIS — D631 Anemia in chronic kidney disease: Secondary | ICD-10-CM | POA: Diagnosis not present

## 2019-06-18 DIAGNOSIS — E1122 Type 2 diabetes mellitus with diabetic chronic kidney disease: Secondary | ICD-10-CM | POA: Diagnosis not present

## 2019-06-18 DIAGNOSIS — N184 Chronic kidney disease, stage 4 (severe): Secondary | ICD-10-CM | POA: Diagnosis not present

## 2019-06-19 ENCOUNTER — Other Ambulatory Visit: Payer: Self-pay | Admitting: Family Medicine

## 2019-06-19 DIAGNOSIS — M170 Bilateral primary osteoarthritis of knee: Secondary | ICD-10-CM | POA: Diagnosis not present

## 2019-06-19 DIAGNOSIS — G894 Chronic pain syndrome: Secondary | ICD-10-CM

## 2019-06-19 NOTE — Telephone Encounter (Signed)
Karen [name] with [facility] left v/m requesting refill on alprazolam and hydrocodone apap 7.5-325 mg.  Name of Medication:Hydrocodone apap 7.5-325mg   Name of Pharmacy: [facility] or Written Date and Quantity:#120 on 05/21/19  Last Office Visit and Type: 03/05/19 neuropathy,DM HTN Next Office Visit and Type: none scheduled Last Controlled Substance Agreement Date: 03/23/2018 Last UDS:04/03/2018

## 2019-06-19 NOTE — Telephone Encounter (Signed)
Last office visit 03/05/2019 for Neuropathy, DM, HTN, Chronic Pain, Ulcer on foot.  Last refilled 05/21/2019 for #90 with no refills. No future appointments with PCP.

## 2019-06-20 DIAGNOSIS — E1122 Type 2 diabetes mellitus with diabetic chronic kidney disease: Secondary | ICD-10-CM | POA: Diagnosis not present

## 2019-06-20 DIAGNOSIS — N184 Chronic kidney disease, stage 4 (severe): Secondary | ICD-10-CM | POA: Diagnosis not present

## 2019-06-20 DIAGNOSIS — D631 Anemia in chronic kidney disease: Secondary | ICD-10-CM | POA: Diagnosis not present

## 2019-06-20 DIAGNOSIS — I130 Hypertensive heart and chronic kidney disease with heart failure and stage 1 through stage 4 chronic kidney disease, or unspecified chronic kidney disease: Secondary | ICD-10-CM | POA: Diagnosis not present

## 2019-06-20 DIAGNOSIS — I509 Heart failure, unspecified: Secondary | ICD-10-CM | POA: Diagnosis not present

## 2019-06-20 DIAGNOSIS — E1151 Type 2 diabetes mellitus with diabetic peripheral angiopathy without gangrene: Secondary | ICD-10-CM | POA: Diagnosis not present

## 2019-06-21 ENCOUNTER — Other Ambulatory Visit: Payer: Self-pay

## 2019-06-21 DIAGNOSIS — G894 Chronic pain syndrome: Secondary | ICD-10-CM

## 2019-06-21 MED ORDER — HYDROCODONE-ACETAMINOPHEN 7.5-325 MG PO TABS: 1.0000 | ORAL_TABLET | Freq: Four times a day (QID) | ORAL | 0 refills | Status: DC | PRN

## 2019-06-21 NOTE — Telephone Encounter (Signed)
Sherry with [facility] left v/m that pt is now out of hydrocodone apap 7.5-325 mg and alprazolam and request refill ASAP. Sherry request cb.

## 2019-06-21 NOTE — Telephone Encounter (Signed)
I notified sherry that refills done and she voiced understanding.

## 2019-06-24 DIAGNOSIS — I130 Hypertensive heart and chronic kidney disease with heart failure and stage 1 through stage 4 chronic kidney disease, or unspecified chronic kidney disease: Secondary | ICD-10-CM | POA: Diagnosis not present

## 2019-06-24 DIAGNOSIS — E1151 Type 2 diabetes mellitus with diabetic peripheral angiopathy without gangrene: Secondary | ICD-10-CM | POA: Diagnosis not present

## 2019-06-24 DIAGNOSIS — N184 Chronic kidney disease, stage 4 (severe): Secondary | ICD-10-CM | POA: Diagnosis not present

## 2019-06-24 DIAGNOSIS — E1122 Type 2 diabetes mellitus with diabetic chronic kidney disease: Secondary | ICD-10-CM | POA: Diagnosis not present

## 2019-06-24 DIAGNOSIS — D631 Anemia in chronic kidney disease: Secondary | ICD-10-CM | POA: Diagnosis not present

## 2019-06-24 DIAGNOSIS — I509 Heart failure, unspecified: Secondary | ICD-10-CM | POA: Diagnosis not present

## 2019-06-25 DIAGNOSIS — D631 Anemia in chronic kidney disease: Secondary | ICD-10-CM | POA: Diagnosis not present

## 2019-06-25 DIAGNOSIS — E1151 Type 2 diabetes mellitus with diabetic peripheral angiopathy without gangrene: Secondary | ICD-10-CM | POA: Diagnosis not present

## 2019-06-25 DIAGNOSIS — I509 Heart failure, unspecified: Secondary | ICD-10-CM | POA: Diagnosis not present

## 2019-06-25 DIAGNOSIS — I130 Hypertensive heart and chronic kidney disease with heart failure and stage 1 through stage 4 chronic kidney disease, or unspecified chronic kidney disease: Secondary | ICD-10-CM | POA: Diagnosis not present

## 2019-06-25 DIAGNOSIS — N184 Chronic kidney disease, stage 4 (severe): Secondary | ICD-10-CM | POA: Diagnosis not present

## 2019-06-25 DIAGNOSIS — E1122 Type 2 diabetes mellitus with diabetic chronic kidney disease: Secondary | ICD-10-CM | POA: Diagnosis not present

## 2019-06-26 DIAGNOSIS — E1151 Type 2 diabetes mellitus with diabetic peripheral angiopathy without gangrene: Secondary | ICD-10-CM | POA: Diagnosis not present

## 2019-06-26 DIAGNOSIS — D631 Anemia in chronic kidney disease: Secondary | ICD-10-CM | POA: Diagnosis not present

## 2019-06-26 DIAGNOSIS — E1122 Type 2 diabetes mellitus with diabetic chronic kidney disease: Secondary | ICD-10-CM | POA: Diagnosis not present

## 2019-06-26 DIAGNOSIS — I509 Heart failure, unspecified: Secondary | ICD-10-CM | POA: Diagnosis not present

## 2019-06-26 DIAGNOSIS — I130 Hypertensive heart and chronic kidney disease with heart failure and stage 1 through stage 4 chronic kidney disease, or unspecified chronic kidney disease: Secondary | ICD-10-CM | POA: Diagnosis not present

## 2019-06-26 DIAGNOSIS — N184 Chronic kidney disease, stage 4 (severe): Secondary | ICD-10-CM | POA: Diagnosis not present

## 2019-07-01 DIAGNOSIS — I509 Heart failure, unspecified: Secondary | ICD-10-CM | POA: Diagnosis not present

## 2019-07-01 DIAGNOSIS — I130 Hypertensive heart and chronic kidney disease with heart failure and stage 1 through stage 4 chronic kidney disease, or unspecified chronic kidney disease: Secondary | ICD-10-CM | POA: Diagnosis not present

## 2019-07-01 DIAGNOSIS — D631 Anemia in chronic kidney disease: Secondary | ICD-10-CM | POA: Diagnosis not present

## 2019-07-01 DIAGNOSIS — E1151 Type 2 diabetes mellitus with diabetic peripheral angiopathy without gangrene: Secondary | ICD-10-CM | POA: Diagnosis not present

## 2019-07-01 DIAGNOSIS — E1122 Type 2 diabetes mellitus with diabetic chronic kidney disease: Secondary | ICD-10-CM | POA: Diagnosis not present

## 2019-07-01 DIAGNOSIS — N184 Chronic kidney disease, stage 4 (severe): Secondary | ICD-10-CM | POA: Diagnosis not present

## 2019-07-07 ENCOUNTER — Telehealth: Payer: Self-pay | Admitting: Family Medicine

## 2019-07-08 ENCOUNTER — Encounter: Payer: Self-pay | Admitting: Internal Medicine

## 2019-07-08 NOTE — Telephone Encounter (Signed)
Last office visit 03/05/2019 for neuropathy due to secondary diabetes.  Last refilled 03/15/2019 for #90 with no refills.  Last thyroid labs 03/22/218.  No future appointments.  Ok to refill?

## 2019-07-09 ENCOUNTER — Ambulatory Visit: Payer: Medicare Other | Admitting: Internal Medicine

## 2019-07-09 NOTE — Telephone Encounter (Signed)
Call pt... needs to have labs draw.. thyroid level not checked in a year. I will refill med but this needs to be set up.

## 2019-07-15 NOTE — Telephone Encounter (Signed)
Spoke with pt she will check with son and call back to schedule

## 2019-07-16 ENCOUNTER — Telehealth: Payer: Self-pay | Admitting: Family Medicine

## 2019-07-16 NOTE — Telephone Encounter (Signed)
Please try to schedule MWV with Nurse and CPE with Dr. Diona Browner.  Can be virtual if needed.  But will need to come in for fasting labs.

## 2019-07-17 ENCOUNTER — Telehealth: Payer: Self-pay

## 2019-07-17 NOTE — Telephone Encounter (Signed)
Labs 12/17 Medicare wellness 12/18 cpx with dr Diona Browner 12/24 Son aware

## 2019-07-17 NOTE — Telephone Encounter (Signed)
Izora Gala with [facility] left v/m that pt has increased pain in both heels but the pain that is a sharp shooting pain in lt heel is worse. The pain can wake pt up at night and is also causing pt to have h/a. Pt has taken morphine before going to bed but Izora Gala does not want pt taking morphine if not necessary for pain pt is having. Izora Gala suggest possibly topical lidocaine cream to numb that feeling. Izora Gala request cb.

## 2019-07-18 ENCOUNTER — Telehealth: Payer: Self-pay | Admitting: Family Medicine

## 2019-07-18 ENCOUNTER — Other Ambulatory Visit (INDEPENDENT_AMBULATORY_CARE_PROVIDER_SITE_OTHER)

## 2019-07-18 ENCOUNTER — Other Ambulatory Visit: Payer: Self-pay

## 2019-07-18 DIAGNOSIS — E785 Hyperlipidemia, unspecified: Secondary | ICD-10-CM

## 2019-07-18 DIAGNOSIS — E1143 Type 2 diabetes mellitus with diabetic autonomic (poly)neuropathy: Secondary | ICD-10-CM | POA: Diagnosis not present

## 2019-07-18 DIAGNOSIS — E1165 Type 2 diabetes mellitus with hyperglycemia: Secondary | ICD-10-CM | POA: Diagnosis not present

## 2019-07-18 DIAGNOSIS — E034 Atrophy of thyroid (acquired): Secondary | ICD-10-CM

## 2019-07-18 DIAGNOSIS — F1120 Opioid dependence, uncomplicated: Secondary | ICD-10-CM

## 2019-07-18 LAB — COMPREHENSIVE METABOLIC PANEL
ALT: 24 U/L (ref 0–35)
AST: 21 U/L (ref 0–37)
Albumin: 3.5 g/dL (ref 3.5–5.2)
Alkaline Phosphatase: 108 U/L (ref 39–117)
BUN: 42 mg/dL — ABNORMAL HIGH (ref 6–23)
CO2: 32 mEq/L (ref 19–32)
Calcium: 8.6 mg/dL (ref 8.4–10.5)
Chloride: 102 mEq/L (ref 96–112)
Creatinine, Ser: 2.16 mg/dL — ABNORMAL HIGH (ref 0.40–1.20)
GFR: 21.26 mL/min — ABNORMAL LOW (ref >60.00)
Glucose, Bld: 131 mg/dL — ABNORMAL HIGH (ref 70–99)
Potassium: 4.2 mEq/L (ref 3.5–5.1)
Sodium: 142 mEq/L (ref 135–145)
Total Bilirubin: 0.7 mg/dL (ref 0.2–1.2)
Total Protein: 5.7 g/dL — ABNORMAL LOW (ref 6.0–8.3)

## 2019-07-18 LAB — CBC WITH DIFFERENTIAL/PLATELET
Basophils Absolute: 0 10*3/uL (ref 0.0–0.1)
Basophils Relative: 0.5 % (ref 0.0–3.0)
Eosinophils Absolute: 0.5 10*3/uL (ref 0.0–0.7)
Eosinophils Relative: 6.2 % — ABNORMAL HIGH (ref 0.0–5.0)
HCT: 32.7 % — ABNORMAL LOW (ref 36.0–46.0)
Hemoglobin: 10.5 g/dL — ABNORMAL LOW (ref 12.0–15.0)
Lymphocytes Relative: 23.8 % (ref 12.0–46.0)
Lymphs Abs: 2 10*3/uL (ref 0.7–4.0)
MCHC: 32.3 g/dL (ref 30.0–36.0)
MCV: 100.6 fl — ABNORMAL HIGH (ref 78.0–100.0)
Monocytes Absolute: 0.8 10*3/uL (ref 0.1–1.0)
Monocytes Relative: 8.9 % (ref 3.0–12.0)
Neutro Abs: 5.2 10*3/uL (ref 1.4–7.7)
Neutrophils Relative %: 60.6 % (ref 43.0–77.0)
Platelets: 191 10*3/uL (ref 150.0–400.0)
RBC: 3.25 Mil/uL — ABNORMAL LOW (ref 3.87–5.11)
RDW: 14.5 % (ref 11.5–15.5)
WBC: 8.6 10*3/uL (ref 4.0–10.5)

## 2019-07-18 LAB — HEMOGLOBIN A1C: Hgb A1c MFr Bld: 5.2 % (ref 4.6–6.5)

## 2019-07-18 MED ORDER — LIDOCAINE-PRILOCAINE 2.5-2.5 % EX CREA: 1.0000 "application " | TOPICAL_CREAM | Freq: Every day | CUTANEOUS | 3 refills | Status: AC | PRN

## 2019-07-18 NOTE — Telephone Encounter (Signed)
-----   Message from Cloyd Stagers, RT sent at 07/17/2019  8:56 AM EST ----- Regarding: Lab Orders for Thursday 12.17.2020 Please place lab orders for Thursday 12.17.2020, [email or web address] visit for physical on 12.24.2020 Thank you, Dyke Maes RT(R)

## 2019-07-18 NOTE — Telephone Encounter (Signed)
Agree with limiting narcotic. Will send in lidocaine topical ... if this is not helpful we can try diclofenac gel.

## 2019-07-18 NOTE — Telephone Encounter (Signed)
Izora Gala notified as instructed by telephone.

## 2019-07-19 ENCOUNTER — Ambulatory Visit (INDEPENDENT_AMBULATORY_CARE_PROVIDER_SITE_OTHER): Payer: Medicare Other

## 2019-07-19 ENCOUNTER — Other Ambulatory Visit: Payer: Self-pay

## 2019-07-19 DIAGNOSIS — Z0000 Encounter for general adult medical examination without abnormal findings: Secondary | ICD-10-CM | POA: Diagnosis not present

## 2019-07-19 NOTE — Patient Instructions (Signed)
Karen [name] , Thank you for taking time to come for your Medicare Wellness Visit. I appreciate your ongoing commitment to your health goals. Please review the following plan we discussed and let me know if I can assist you in the future.   Screening recommendations/referrals: Colonoscopy: no loner required Mammogram: no longer required Bone Density: no longer required Recommended yearly ophthalmology/optometry visit for glaucoma screening and checkup Recommended yearly dental visit for hygiene and checkup  Vaccinations: Influenza vaccine: Up to date, completed 06/19/2019 Pneumococcal vaccine: Completed series Tdap vaccine: Up to date, completed 07/20/2013 Shingles vaccine: Discussed    Advanced directives: copy in chart  Conditions/risks identified: diabetes, hypertension, hyperlipidemia  Next appointment: 07/25/2019 @ 9:20 am    Preventive Care 83 Years and Older, Female Preventive care refers to lifestyle choices and visits with your health care provider that can promote health and wellness. What does preventive care include?  A yearly physical exam. This is also called an annual well check.  Dental exams once or twice a year.  Routine eye exams. Ask your health care provider how often you should have your eyes checked.  Personal lifestyle choices, including:  Daily care of your teeth and gums.  Regular physical activity.  Eating a healthy diet.  Avoiding tobacco and drug use.  Limiting alcohol use.  Practicing safe sex.  Taking low-dose aspirin every day.  Taking vitamin and mineral supplements as recommended by your health care provider. What happens during an annual well check? The services and screenings done by your health care provider during your annual well check will depend on your age, overall health, lifestyle risk factors, and family history of disease. Counseling  Your health care provider may ask you questions about your:  Alcohol  use.  Tobacco use.  Drug use.  Emotional well-being.  Home and relationship well-being.  Sexual activity.  Eating habits.  History of falls.  Memory and ability to understand (cognition).  Work and work Statistician.  Reproductive health. Screening  You may have the following tests or measurements:  Height, weight, and BMI.  Blood pressure.  Lipid and cholesterol levels. These may be checked every 5 years, or more frequently if you are over 83 years old.  Skin check.  Lung cancer screening. You may have this screening every year starting at age 83 if you have a 30-pack-year history of smoking and currently smoke or have quit within the past 15 years.  Fecal occult blood test (FOBT) of the stool. You may have this test every year starting at age 83.  Flexible sigmoidoscopy or colonoscopy. You may have a sigmoidoscopy every 5 years or a colonoscopy every 10 years starting at age 83.  Hepatitis C blood test.  Hepatitis B blood test.  Sexually transmitted disease (STD) testing.  Diabetes screening. This is done by checking your blood sugar (glucose) after you have not eaten for a while (fasting). You may have this done every 1-3 years.  Bone density scan. This is done to screen for osteoporosis. You may have this done starting at age 83.  Mammogram. This may be done every 1-2 years. Talk to your health care provider about how often you should have regular mammograms. Talk with your health care provider about your test results, treatment options, and if necessary, the need for more tests. Vaccines  Your health care provider may recommend certain vaccines, such as:  Influenza vaccine. This is recommended every year.  Tetanus, diphtheria, and acellular pertussis (Tdap, Td) vaccine. You may need  a Td booster every 10 years.  Zoster vaccine. You may need this after age 83.  Pneumococcal 13-valent conjugate (PCV13) vaccine. One dose is recommended after age  83.  Pneumococcal polysaccharide (PPSV23) vaccine. One dose is recommended after age 83. Talk to your health care provider about which screenings and vaccines you need and how often you need them. This information is not intended to replace advice given to you by your health care provider. Make sure you discuss any questions you have with your health care provider. Document Released: 08/14/2015 Document Revised: 04/06/2016 Document Reviewed: 05/19/2015 Elsevier Interactive Patient Education  2017 [place] Prevention in the Home Falls can cause injuries. They can happen to people of all ages. There are many things you can do to make your home safe and to help prevent falls. What can I do on the outside of my home?  Regularly fix the edges of walkways and driveways and fix any cracks.  Remove anything that might make you trip as you walk through a door, such as a raised step or threshold.  Trim any bushes or trees on the path to your home.  Use bright outdoor lighting.  Clear any walking paths of anything that might make someone trip, such as rocks or tools.  Regularly check to see if handrails are loose or broken. Make sure that both sides of any steps have handrails.  Any raised decks and porches should have guardrails on the edges.  Have any leaves, snow, or ice cleared regularly.  Use sand or salt on walking paths during winter.  Clean up any spills in your garage right away. This includes oil or grease spills. What can I do in the bathroom?  Use night lights.  Install grab bars by the toilet and in the tub and shower. Do not use towel bars as grab bars.  Use non-skid mats or decals in the tub or shower.  If you need to sit down in the shower, use a plastic, non-slip stool.  Keep the floor dry. Clean up any water that spills on the floor as soon as it happens.  Remove soap buildup in the tub or shower regularly.  Attach bath mats securely with double-sided  non-slip rug tape.  Do not have throw rugs and other things on the floor that can make you trip. What can I do in the bedroom?  Use night lights.  Make sure that you have a light by your bed that is easy to reach.  Do not use any sheets or blankets that are too big for your bed. They should not hang down onto the floor.  Have a firm chair that has side arms. You can use this for support while you get dressed.  Do not have throw rugs and other things on the floor that can make you trip. What can I do in the kitchen?  Clean up any spills right away.  Avoid walking on wet floors.  Keep items that you use a lot in easy-to-reach places.  If you need to reach something above you, use a strong step stool that has a grab bar.  Keep electrical cords out of the way.  Do not use floor polish or wax that makes floors slippery. If you must use wax, use non-skid floor wax.  Do not have throw rugs and other things on the floor that can make you trip. What can I do with my stairs?  Do not leave any items on the  stairs.  Make sure that there are handrails on both sides of the stairs and use them. Fix handrails that are broken or loose. Make sure that handrails are as long as the stairways.  Check any carpeting to make sure that it is firmly attached to the stairs. Fix any carpet that is loose or worn.  Avoid having throw rugs at the top or bottom of the stairs. If you do have throw rugs, attach them to the floor with carpet tape.  Make sure that you have a light switch at the top of the stairs and the bottom of the stairs. If you do not have them, ask someone to add them for you. What else can I do to help prevent falls?  Wear shoes that:  Do not have high heels.  Have rubber bottoms.  Are comfortable and fit you well.  Are closed at the toe. Do not wear sandals.  If you use a stepladder:  Make sure that it is fully opened. Do not climb a closed stepladder.  Make sure that both  sides of the stepladder are locked into place.  Ask someone to hold it for you, if possible.  Clearly mark and make sure that you can see:  Any grab bars or handrails.  First and last steps.  Where the edge of each step is.  Use tools that help you move around (mobility aids) if they are needed. These include:  Canes.  Walkers.  Scooters.  Crutches.  Turn on the lights when you go into a dark area. Replace any light bulbs as soon as they burn out.  Set up your furniture so you have a clear path. Avoid moving your furniture around.  If any of your floors are uneven, fix them.  If there are any pets around you, be aware of where they are.  Review your medicines with your doctor. Some medicines can make you feel dizzy. This can increase your chance of falling. Ask your doctor what other things that you can do to help prevent falls. This information is not intended to replace advice given to you by your health care provider. Make sure you discuss any questions you have with your health care provider. Document Released: 05/14/2009 Document Revised: 12/24/2015 Document Reviewed: 08/22/2014 Elsevier Interactive Patient Education  2017 [employer].

## 2019-07-19 NOTE — Progress Notes (Signed)
PCP notes:  Health Maintenance: Needs foot exam and urine microalbumin   Abnormal Screenings: none   Patient concerns: Patient is having chronic pain in her feet. Patient has no appetite and wants to discuss Ensure as a supplement.    Nurse concerns: none   Next PCP appt.: 07/25/2019 @ 9:20 am

## 2019-07-19 NOTE — Progress Notes (Signed)
Subjective:   Karen [name] is a 85 y.o. female who presents for Medicare Annual (Subsequent) preventive examination.  Review of Systems: N/A   This visit is being conducted through telemedicine via telephone at the nurse health advisor's home address due to the COVID-19 pandemic. This patient has given me verbal consent via doximity to conduct this visit, patient states they are participating from their home address. Patient and myself are on the telephone call. There is no referral for this visit. Some vital signs may be absent or patient reported.    Patient identification: identified by name, DOB, and current address  Cardiac Risk Factors include: advanced age (>22men, >59 women);diabetes mellitus;hypertension;dyslipidemia     Objective:     Vitals: There were no vitals taken for this visit.  There is no height or weight on file to calculate BMI.  Advanced Directives 07/19/2019 10/03/2018 07/20/2018 06/26/2018 07/11/2017 03/27/2017 01/18/2017  Does Patient Have a Medical Advance Directive? Yes Yes Yes Yes Yes No Yes  Type of Paramedic of [place];Living will [place];Living will Living will;Healthcare Power of Attorney Living will Living will - [place];Living will  Does patient want to make changes to medical advance directive? - No - Patient declined No - Patient declined - No - Patient declined - -  Copy of [place] in Chart? Yes - validated most recent copy scanned in chart (See row information) No - copy requested - - - - No - copy requested  Would patient like information on creating a medical advance directive? - - - - - No - Patient declined -    Tobacco Social History   Tobacco Use  Smoking Status Never Smoker  Smokeless Tobacco Never Used     Counseling given: Not Answered   Clinical Intake:  Pre-visit preparation completed: Yes  Pain : 0-10 Pain Score: 10-Worst pain ever  Pain Type: Chronic pain Pain Location: Foot Pain Orientation: Left Pain Descriptors / Indicators: Aching Pain Onset: More than a month ago Pain Frequency: Intermittent Pain Relieving Factors: lidocaine  Pain Relieving Factors: lidocaine  Nutritional Risks: None Diabetes: Yes CBG done?: No Did pt. bring in CBG monitor from home?: No  How often do you need to have someone help you when you read instructions, pamphlets, or other written materials from your doctor or pharmacy?: 1 - Never  Interpreter Needed?: No  Information entered by :: CJohnson, LPN  Past Medical History:  Diagnosis Date  . A-fib ([place])   . Abnormal involuntary movements(781.0)   . Anemia   . Anxiety   . Anxiety state, unspecified   . Arthritis    body, neck  . Benign hypertensive heart disease   . Benign hypertensive heart disease with congestive heart failure ([place])   . Bladder disorder    over active  . Cancer ([place])    skin, removed  . Cervicalgia   . CHF (congestive heart failure) ([place])   . Chronic kidney disease, stage III (moderate)   . Congestive heart failure, unspecified   . Contact dermatitis and other eczema, due to unspecified cause   . Corns and callosities   . Diabetes mellitus without complication ([place])   . Diarrhea   . Dyspnea 02/18/2015  . Dysuria 10/01/2018  . Edema   . Esophageal stricture   . Gastroparesis   . Gastroparesis   . GERD (gastroesophageal reflux disease)   . Hiatal hernia   . Hyperlipidemia   . Inflamed  seborrheic keratosis   . Inflammatory disease of breast   . Irregular heartbeat   . Lumbago   . Mixed incontinence urge and stress (female)(female)   . Nausea alone   . Nonspecific (abnormal) findings on radiological and other examination of skull and head   . Osteoarthrosis, unspecified whether generalized or localized, unspecified site   . Other and unspecified hyperlipidemia   . Other B-complex deficiencies   . Other functional disorder of bladder   . Other  malaise and fatigue   . Other specified visual disturbances   . Pain in joint, lower leg   . Palpitations   . Peripheral vascular disease, unspecified ([place])   . Postmenopausal atrophic vaginitis   . Reflux esophagitis   . Seborrheic keratosis   . Sleep apnea   . Spasm of muscle   . Spinal stenosis   . Spinal stenosis, unspecified region other than cervical   . Stricture and stenosis of esophagus   . Stroke ([place])   . TIA (transient ischemic attack) 2012  . Transient ischemic attack (TIA), and cerebral infarction without residual deficits(V12.54)   . Type II or unspecified type diabetes mellitus with renal manifestations, not stated as uncontrolled(250.40)   . Unspecified constipation   . Unspecified essential hypertension   . Unspecified hereditary and idiopathic peripheral neuropathy   . Unspecified hypothyroidism   . Unspecified pruritic disorder   . Unspecified sleep apnea   . Unspecified vitamin D deficiency   . Urinary tract infection, site not specified   . Vitamin B12 deficiency    Past Surgical History:  Procedure Laterality Date  . ABDOMINAL HYSTERECTOMY    . APPENDECTOMY    . BACK SURGERY     x 2  . CESAREAN SECTION    . CHOLECYSTECTOMY  1991  . esophageal  2004,2006   dilation, Dr Lyla Son  . [place]   bilateral cataract  . hammer toes    . HERNIA REPAIR  1984aaaaaaaa  . SKIN CANCER DESTRUCTION    . THYROIDECTOMY, PARTIAL  1965   Family History  Problem Relation Age of Onset  . Cancer Mother        ? stomach  . Heart disease Father   . Hyperlipidemia Father   . Hypertension Father   . Heart attack Father   . Heart disease Brother   . Kidney disease Daughter   . Heart disease Brother   . Cancer Son    Social History   Socioeconomic History  . Marital status: Widowed    Spouse name: Not on file  . Number of children: 3  . Years of education: Not on file  . Highest education level: Not on file  Occupational History  .  Occupation: retired  Tobacco Use  . Smoking status: Never Smoker  . Smokeless tobacco: Never Used  Substance and Sexual Activity  . Alcohol use: No  . Drug use: No  . Sexual activity: Never  Other Topics Concern  . Not on file  Social History Narrative  . Not on file   Social Determinants of Health   Financial Resource Strain: Low Risk   . Difficulty of Paying Living Expenses: Not hard at all  Food Insecurity: No Food Insecurity  . Worried About Charity fundraiser in the Last Year: Never true  . Ran Out of Food in the Last Year: Never true  Transportation Needs: No Transportation Needs  . Lack of Transportation (Medical): No  . Lack of  Transportation (Non-Medical): No  Physical Activity: Inactive  . Days of Exercise per Week: 0 days  . Minutes of Exercise per Session: 0 min  Stress: No Stress Concern Present  . Feeling of Stress : Not at all  Social Connections:   . Frequency of Communication with Friends and Family: Not on file  . Frequency of Social Gatherings with Friends and Family: Not on file  . Attends Religious Services: Not on file  . Active Member of Clubs or Organizations: Not on file  . Attends Archivist Meetings: Not on file  . Marital Status: Not on file    Outpatient Encounter Medications as of 07/19/2019  Medication Sig  . Alpha-Lipoic Acid 600 MG TABS Take 1 tablet by mouth 2 (two) times a day.  . ALPRAZolam (XANAX) 0.5 MG tablet TAKE 1 TABLET (0.5 MG TOTAL) BY MOUTH 3 (THREE) TIMES DAILY AS NEEDED.  Marland Kitchen amLODipine (NORVASC) 2.5 MG tablet TAKE 1 TABLET BY MOUTH EVERY DAY  . aspirin EC 81 MG EC tablet Take 1 tablet (81 mg total) by mouth daily.  Marland Kitchen atorvastatin (LIPITOR) 10 MG tablet Take 1 tablet (10 mg total) by mouth daily at 6 PM.  . b complex vitamins tablet Take 1 tablet by mouth daily.  . Cholecalciferol (VITAMIN D3) 1.25 MG (50000 UT) TABS Take 1 tablet by mouth every 7 (seven) days. every Monday.  Marland Kitchen CRANBERRY PO Take 300 mg by mouth  daily.  . [employer] NATURAL LUTEIN EYE HEALTH PO Take 1 tablet by mouth daily.   . diclofenac sodium (VOLTAREN) 1 % GEL Apply 4 g topically 4 (four) times daily. To bilateral knees  . furosemide (LASIX) 40 MG tablet TAKE 1 TABLET BY MOUTH DAILY TO CONTROL EDEMA  . gabapentin (NEURONTIN) 100 MG capsule TAKE TWO CAPSULES BY MOUTH AT BEDTIME TO HELP WITH TREMORS  . glucagon (GLUCAGON EMERGENCY) 1 MG injection Inject 1 mg into the muscle once as needed for up to 1 dose.  Marland Kitchen HYDROcodone-acetaminophen (NORCO) 7.5-325 MG tablet Take 1 tablet by mouth every 6 (six) hours as needed for moderate pain.  . Insulin Glargine (LANTUS SOLOSTAR) 100 UNIT/ML Solostar Pen Inject 13-14 Units into the skin at bedtime.  . insulin lispro (HUMALOG KWIKPEN) 100 UNIT/ML KwikPen Inject 6 units at breakfast, 8 units at lunch and 8 units at dinner  . Insulin Pen Needle (B-D ULTRAFINE III SHORT PEN) 31G X 8 MM MISC Use with administering insulin. Dx. E11.29  . isosorbide mononitrate (IMDUR) 30 MG 24 hr tablet TAKE 1 TABLET BY MOUTH EVERY DAY  . levothyroxine (SYNTHROID) 125 MCG tablet TAKE 1 TABLET (125 MCG TOTAL) BY MOUTH DAILY BEFORE BREAKFAST.  Marland Kitchen lidocaine-prilocaine (EMLA) cream Apply 1 application topically daily as needed.  . metoprolol tartrate (LOPRESSOR) 25 MG tablet TAKE 0.5 TABLETS (12.5 MG TOTAL) BY MOUTH 2 (TWO) TIMES DAILY.  Marland Kitchen Morphine Sulfate (MORPHINE CONCENTRATE) 10 MG/0.5ML SOLN concentrated solution Place 0.5 mLs (10 mg total) under the tongue every 4 (four) hours as needed for severe pain (Chest pain or anginal equivalent).  . Multiple Vitamins-Minerals (PRESERVISION AREDS 2 PO) Take 1 tablet by mouth 2 (two) times daily.   . nitroGLYCERIN (NITROSTAT) 0.4 MG SL tablet Place 1 tablet (0.4 mg total) under the tongue every 5 (five) minutes as needed for chest pain.  Marland Kitchen omeprazole (PRILOSEC) 40 MG capsule Take 1 capsule (40 mg total) by mouth daily. TAKE ONE (1) CAPSULE BY MOUTH 2 TIMES DAILY FOR STOMACH  . ondansetron  (ZOFRAN) 4  MG tablet TAKE ONE TABLET BY MOUTH EVERY 4 HOURS AS NEEDED FOR NAUSEA/VOMITING  . ONE TOUCH ULTRA TEST test strip Use to test sugar three times daily dx E11.29  . Polyethyl Glycol-Propyl Glycol (SYSTANE OP) Place 1 drop into both eyes 2 (two) times daily.   Marland Kitchen senna (SENOKOT) 8.6 MG TABS tablet Take 1 tablet by mouth daily as needed for mild constipation.  . triamcinolone cream (KENALOG) 0.1 % Apply 1 application topically as needed. Mix with CeraVe cream apply twice daily for dry skin  . trimethoprim (TRIMPEX) 100 MG tablet TAKE ONE TABLET BY MOUTH EVERY NIGHT AT BEDTIME TO PREVENT BLADDER INFECTION.  Marland Kitchen VITAMIN E PO Take 400 Units by mouth daily.   No facility-administered encounter medications on file as of 07/19/2019.    Activities of Daily Living In your present state of health, do you have any difficulty performing the following activities: 07/19/2019 10/01/2018  Hearing? N N  Vision? N N  Difficulty concentrating or making decisions? N N  Walking or climbing stairs? Y N  Comment needs assistance -  Dressing or bathing? N N  Comment - -  Doing errands, shopping? N Y  Comment - -  Conservation officer, nature and eating ? N -  Using the Toilet? N -  In the past six months, have you accidently leaked urine? Y -  Comment wears depends and pads -  Do you have problems with loss of bowel control? N -  Managing your Medications? N -  Managing your Finances? N -  Comment - -  Housekeeping or managing your Housekeeping? N -  Comment - -  Some recent data might be hidden    Patient Care Team: Jinny Sanders, MD as PCP - General (Family Medicine) Nahser, Wonda Cheng, MD as PCP - Cardiology (Cardiology) Leia Alf, DPM (Inactive) as Attending Physician (Podiatry) Marchia Bond, MD as Consulting Physician (Orthopedic Surgery)    Assessment:   This is a routine wellness examination for [facility].  Exercise Activities and Dietary recommendations Current Exercise Habits: Home  exercise routine, Type of exercise: stretching, Time (Minutes): 15, Frequency (Times/Week): 3, Weekly Exercise (Minutes/Week): 45, Intensity: Mild, Exercise limited by: None identified  Goals    . Increase water intake     Starting 07/20/16, I will attempt to increase my water intake to 5 bottles per day.     . Maintain Lifestyle     Starting today pt will maintain lifestyle.     . Patient Stated     07/19/2019, I will maintain and continue medications as prescribed       Fall Risk Fall Risk  07/19/2019 09/24/2018 09/03/2018 08/20/2018 08/14/2018  Falls in the past year? 1 (No Data) (No Data) (No Data) (No Data)  Comment - Denies new/ recent falls Denies new/ recent falls Denies new/ recent falls since last [place] outreach last week Patient reports another fall yesterday, without injury  Number falls in past yr: 1 - - - -  Comment - - - - -  Injury with Fall? 0 - - - -  Comment - - - - -  Risk Factor Category  - - - - -  Risk for fall due to : History of fall(s);Medication side effect;Impaired balance/gait;Impaired mobility - - - Medication side effect;Impaired mobility;Impaired balance/gait;History of fall(s)  Follow up Falls evaluation completed;Falls prevention discussed Falls prevention discussed Falls prevention discussed Falls prevention discussed;Education provided Falls prevention discussed;Education provided   Is the patient's home free of  loose throw rugs in walkways, pet beds, electrical cords, etc?   yes      Grab bars in the bathroom? no      Handrails on the stairs?   no      Adequate lighting?   yes  Timed Get Up and Go performed: N/A  Depression Screen PHQ 2/9 Scores 07/19/2019 07/20/2018 07/11/2017 03/27/2017  PHQ - 2 Score 0 0 0 0  PHQ- 9 Score 0 - - -     Cognitive Function MMSE - Mini Mental State Exam 07/19/2019 07/11/2017 07/20/2016  Orientation to time 5 5 5   Orientation to Place 5 5 5   Registration 3 3 3   Attention/ Calculation 5 5 4   Recall  3 2 3   Language- name 2 objects - 2 2  Language- repeat 1 1 1   Language- follow 3 step command - 2 3  Language- read & follow direction - 1 1  Write a sentence - 1 1  Copy design - 0 0  Total score - 27 28  Mini Cog  Mini-Cog screen was completed. Maximum score is 22. A value of 0 denotes this part of the MMSE was not completed or the patient failed this part of the Mini-Cog screening.       Immunization History  Administered Date(s) Administered  . Influenza Split 04/14/2010, 04/20/2011, 05/03/2012  . Influenza Whole 04/21/2016  . Influenza, High Dose Seasonal PF 05/03/2017, 04/18/2018, 06/19/2019  . Influenza,inj,Quad PF,6+ Mos 05/15/2013, 06/03/2014  . Influenza-Unspecified 05/15/2015  . Pneumococcal Conjugate-13 09/23/2014  . Pneumococcal Polysaccharide-23 01/27/2016  . Tdap 07/20/2013    Qualifies for Shingles Vaccine? Yes  Screening Tests Health Maintenance  Topic Date Due  . FOOT EXAM  08/16/2018  . URINE MICROALBUMIN  11/22/2018  . HEMOGLOBIN A1C  01/16/2020  . OPHTHALMOLOGY EXAM  04/04/2020  . TETANUS/TDAP  07/21/2023  . INFLUENZA VACCINE  Completed  . DEXA SCAN  Completed  . PNA vac Low Risk Adult  Completed    Cancer Screenings: Lung: Low Dose CT Chest recommended if Age 85-80 years, 30 pack-year currently smoking OR have quit w/in 15years. Patient does not qualify. Breast:  Up to date on Mammogram? No longer required  Up to date of Bone Density/Dexa? No longer required Colorectal: no longer required  Additional Screenings:  Hepatitis C Screening: N/A     Plan:   Patient will maintain and continue medications as prescribed.   I have personally reviewed and noted the following in the patient's chart:   . Medical and social history . Use of alcohol, tobacco or illicit drugs  . Current medications and supplements . Functional ability and status . Nutritional status . Physical activity . Advanced directives . List of other physicians .  Hospitalizations, surgeries, and ER visits in previous 12 months . Vitals . Screenings to include cognitive, depression, and falls . Referrals and appointments  In addition, I have reviewed and discussed with patient certain preventive protocols, quality metrics, and best practice recommendations. A written personalized care plan for preventive services as well as general preventive health recommendations were provided to patient.     Andrez Grime, LPN  [id number]

## 2019-07-19 NOTE — Progress Notes (Signed)
No critical labs need to be addressed urgently. We will discuss labs in detail at upcoming office visit.   

## 2019-07-22 ENCOUNTER — Other Ambulatory Visit: Payer: Self-pay

## 2019-07-22 DIAGNOSIS — G894 Chronic pain syndrome: Secondary | ICD-10-CM

## 2019-07-22 NOTE — Telephone Encounter (Signed)
Last office visit 03/05/2019 for follow up neuropathy due to secondary diabetes.  CPE is scheduled for 07/25/2019.  Last refilled Alprazolam 06/21/2019 for #90 with no refills.  Norco 06/21/2019 for #120 with no refills.  UDS/Contract 04/03/2018.

## 2019-07-23 MED ORDER — HYDROCODONE-ACETAMINOPHEN 7.5-325 MG PO TABS: 1.0000 | ORAL_TABLET | Freq: Four times a day (QID) | ORAL | 0 refills | Status: DC | PRN

## 2019-07-23 MED ORDER — ALPRAZOLAM 0.5 MG PO TABS: 0.5000 mg | ORAL_TABLET | Freq: Three times a day (TID) | ORAL | 0 refills | Status: DC | PRN

## 2019-07-23 NOTE — Telephone Encounter (Signed)
Prescription is refilled.

## 2019-07-23 NOTE — Telephone Encounter (Signed)
I have sent a text message to Dr. Diona Browner asking her to refill from home this evening if at all possible.

## 2019-07-23 NOTE — Telephone Encounter (Signed)
Celesta Aver calling that his mom needs refill on hydrocodone and xanax since pt is out of med; Renard Matter note advised to pt's son and Mr Lauro Regulus voiced understanding. Miquel Dunn CMA said already discussed could speak with hospice dr but that would not be for whole rx and would mess up future refills. Mr Lauro Regulus will ck with pharmacy later to see if meds refilled today.

## 2019-07-23 NOTE — Telephone Encounter (Signed)
Patient daughter calling requesting these be refilled today by another Provider, pt is out.  I advised that this has been sent to Dr Diona Browner but she is not back in office until Thursday 12/24. Fraser Din daughter states that the patient needs this refilled before then.   [name] please advise if there is anyone else that can refill this today.

## 2019-07-24 NOTE — Telephone Encounter (Signed)
I spoke with Mr Karen [name] and he got pts med last night and he was very appreciative; he said pt had a good night. Nothing further needed.

## 2019-07-25 ENCOUNTER — Encounter: Payer: Self-pay | Admitting: Family Medicine

## 2019-07-25 ENCOUNTER — Telehealth: Payer: Self-pay | Admitting: *Deleted

## 2019-07-25 ENCOUNTER — Other Ambulatory Visit: Payer: Self-pay

## 2019-07-25 ENCOUNTER — Ambulatory Visit (INDEPENDENT_AMBULATORY_CARE_PROVIDER_SITE_OTHER): Payer: Medicare Other | Admitting: Family Medicine

## 2019-07-25 VITALS — BP 119/60 | Wt 132.0 lb

## 2019-07-25 DIAGNOSIS — I10 Essential (primary) hypertension: Secondary | ICD-10-CM

## 2019-07-25 DIAGNOSIS — G894 Chronic pain syndrome: Secondary | ICD-10-CM

## 2019-07-25 DIAGNOSIS — I480 Paroxysmal atrial fibrillation: Secondary | ICD-10-CM

## 2019-07-25 DIAGNOSIS — Z0000 Encounter for general adult medical examination without abnormal findings: Secondary | ICD-10-CM | POA: Diagnosis not present

## 2019-07-25 DIAGNOSIS — R1310 Dysphagia, unspecified: Secondary | ICD-10-CM

## 2019-07-25 DIAGNOSIS — R600 Localized edema: Secondary | ICD-10-CM

## 2019-07-25 DIAGNOSIS — E1340 Other specified diabetes mellitus with diabetic neuropathy, unspecified: Secondary | ICD-10-CM | POA: Diagnosis not present

## 2019-07-25 DIAGNOSIS — R609 Edema, unspecified: Secondary | ICD-10-CM

## 2019-07-25 DIAGNOSIS — E039 Hypothyroidism, unspecified: Secondary | ICD-10-CM

## 2019-07-25 DIAGNOSIS — E1143 Type 2 diabetes mellitus with diabetic autonomic (poly)neuropathy: Secondary | ICD-10-CM

## 2019-07-25 DIAGNOSIS — F1120 Opioid dependence, uncomplicated: Secondary | ICD-10-CM | POA: Diagnosis not present

## 2019-07-25 DIAGNOSIS — E1165 Type 2 diabetes mellitus with hyperglycemia: Secondary | ICD-10-CM

## 2019-07-25 MED ORDER — POTASSIUM CHLORIDE CRYS ER 20 MEQ PO TBCR: 20.0000 meq | EXTENDED_RELEASE_TABLET | Freq: Every day | ORAL | 0 refills | Status: DC

## 2019-07-25 MED ORDER — FUROSEMIDE 40 MG PO TABS: ORAL_TABLET | ORAL | 2 refills | Status: AC

## 2019-07-25 MED ORDER — METOCLOPRAMIDE HCL 5 MG PO TABS: 5.0000 mg | ORAL_TABLET | Freq: Three times a day (TID) | ORAL | 3 refills | Status: AC

## 2019-07-25 NOTE — Assessment & Plan Note (Addendum)
   If  BP consistently < 115/65 hold the  Amlodipine daily.  Follow blood pressure at home.. call if issues.

## 2019-07-25 NOTE — Assessment & Plan Note (Signed)
Rate controlled. ON ASA anticoagulation.

## 2019-07-25 NOTE — Progress Notes (Signed)
See phone note from 07/25/2019 from [place] with hospice.

## 2019-07-25 NOTE — Telephone Encounter (Signed)
Noted and discussed at today's OV.

## 2019-07-25 NOTE — Telephone Encounter (Signed)
Left message for Karen [name] with hospice instructions from Ms. Mithchell's visit today:  Can increase furosemide to 1.5 tabs daily if peripheral edmea in is increased. Make sure if taking addiitonal 1/2 tab to take potassium along with it at 20 MEq.  If  BP consistently < 115/65 hold the  Amlodipine daily.  Follow blood pressure at home.. call if issues. Hold zofran. Lab appt in 2 weeks.  I ask that she call us back on Monday with concerns or questions.

## 2019-07-25 NOTE — Assessment & Plan Note (Signed)
Followed by ENDO 

## 2019-07-25 NOTE — Progress Notes (Addendum)
VIRTUAL VISIT Due to national recommendations of social distancing due to [place] 19, a virtual visit is felt to be most appropriate for this patient at this time.   I connected with the patient on 07/25/19 at  9:20 AM EST by virtual telehealth platform and verified that I am speaking with the correct person using two identifiers.   I discussed the limitations, risks, security and privacy concerns of performing an evaluation and management service by  virtual telehealth platform and the availability of in person appointments. I also discussed with the patient that there may be a patient responsible charge related to this service. The patient expressed understanding and agreed to proceed.  Patient location: Home Provider Location: [facility] Participants: Eliezer Lofts and FAYOLA MECKES    CC: follow up History of Present Illness:  The patient presents for  review of chronic health problems. He/She also has the following acute concerns today:  The patient saw a LPN or RN for medicare wellness visit.  Prevention and wellness was reviewed in detail. Note reviewed and important notes copied below.  Health Maintenance: Needs foot exam and urine microalbumin   Abnormal Screenings: none   Patient concerns: Patient is having chronic pain in her feet. Patient has no appetite and wants to discuss Ensure as a supplement.   07/25/19   On hospice: Concerns from hospice nurse  poor control of neuropathic pain in feet: Seems to be worse when edema worse. Questions whether change in furosemide would help.  Vomiting after eating .. as soon as eats it " comes back up" No blood in emesis. Regular BMs.. firm, strains with BM.    Hx of esophageal stricture. occ food getting stuck. No coughing an choking. Possible GERD/gastroparesis  Zofran prior to eating does not help  Diabetes:   Low A1C on insulin units with meals, Humalog, 8-10 Lantus at night.. followed in past by ENDO,  next OV after christmas. Not eating well given emesis issue.  Lab Results  Component Value Date   HGBA1C 5.2 07/18/2019  Using medications without difficulties: Hypoglycemic episodes:none Hyperglycemic episodes: occ Feet problems: neuropathy on gabapentin Blood Sugars averaging: FBS 100s eye exam within last year: uptodate 04/2019  PAF: rate controlled on metoprolol, digoxin and prn cardizem. She takes ASA daily for antiplatelet. No palpitations  HTN:  Low normal control. BP Readings from Last 3 Encounters:  07/25/19 119/60  03/21/19 120/60  03/05/19 138/60   Hypothyroidism: Due for re-eval.  Chronic pain syndrome/OA/lumbar spinal stenosis - she has joint stiffness. She uses topical diclofenac and takes hydrocodone 4 times a day..  7.5 /325 mg .  CKD: slightly elevated given increase in furosemide    Anemia: Hg stable at 10.5    Wt Readings from Last 3 Encounters:  07/25/19 132 lb (59.9 kg)  03/21/19 136 lb (61.7 kg)  03/05/19 138 lb (62.6 kg)    COVID 19 screen No recent travel or known exposure to [place] The patient denies respiratory symptoms of COVID 19 at this time.  The importance of social distancing was discussed today.   Review of Systems  Constitutional: Negative for weight loss.  HENT: Negative for ear pain.   Respiratory: Negative for cough.   Cardiovascular: Negative for chest pain.  Gastrointestinal: Positive for nausea. Negative for constipation, diarrhea and vomiting.  Genitourinary: Negative for dysuria.  Musculoskeletal: Negative for myalgias.      Past Medical History:  Diagnosis Date  . A-fib ([place])   . Abnormal involuntary movements(781.0)   .  Anemia   . Anxiety   . Anxiety state, unspecified   . Arthritis    body, neck  . Benign hypertensive heart disease   . Benign hypertensive heart disease with congestive heart failure ([place])   . Bladder disorder    over active  . Cancer ([place])    skin, removed  . Cervicalgia   . CHF (congestive  heart failure) ([place])   . Chronic kidney disease, stage III (moderate)   . Congestive heart failure, unspecified   . Contact dermatitis and other eczema, due to unspecified cause   . Corns and callosities   . Diabetes mellitus without complication ([place])   . Diarrhea   . Dyspnea 02/18/2015  . Dysuria 10/01/2018  . Edema   . Esophageal stricture   . Gastroparesis   . Gastroparesis   . GERD (gastroesophageal reflux disease)   . Hiatal hernia   . Hyperlipidemia   . Inflamed seborrheic keratosis   . Inflammatory disease of breast   . Irregular heartbeat   . Lumbago   . Mixed incontinence urge and stress (female)(female)   . Nausea alone   . Nonspecific (abnormal) findings on radiological and other examination of skull and head   . Osteoarthrosis, unspecified whether generalized or localized, unspecified site   . Other and unspecified hyperlipidemia   . Other B-complex deficiencies   . Other functional disorder of bladder   . Other malaise and fatigue   . Other specified visual disturbances   . Pain in joint, lower leg   . Palpitations   . Peripheral vascular disease, unspecified ([place])   . Postmenopausal atrophic vaginitis   . Reflux esophagitis   . Seborrheic keratosis   . Sleep apnea   . Spasm of muscle   . Spinal stenosis   . Spinal stenosis, unspecified region other than cervical   . Stricture and stenosis of esophagus   . Stroke ([place])   . TIA (transient ischemic attack) 2012  . Transient ischemic attack (TIA), and cerebral infarction without residual deficits(V12.54)   . Type II or unspecified type diabetes mellitus with renal manifestations, not stated as uncontrolled(250.40)   . Unspecified constipation   . Unspecified essential hypertension   . Unspecified hereditary and idiopathic peripheral neuropathy   . Unspecified hypothyroidism   . Unspecified pruritic disorder   . Unspecified sleep apnea   . Unspecified vitamin D deficiency   . Urinary tract infection, site not  specified   . Vitamin B12 deficiency     reports that she has never smoked. She has never used smokeless tobacco. She reports that she does not drink alcohol or use drugs.   Current Outpatient Medications:  .  Alpha-Lipoic Acid 600 MG TABS, Take 1 tablet by mouth 2 (two) times a day., Disp: , Rfl:  .  ALPRAZolam (XANAX) 0.5 MG tablet, Take 1 tablet (0.5 mg total) by mouth 3 (three) times daily as needed., Disp: 90 tablet, Rfl: 0 .  amLODipine (NORVASC) 2.5 MG tablet, TAKE 1 TABLET BY MOUTH EVERY DAY, Disp: 30 tablet, Rfl: 5 .  aspirin EC 81 MG EC tablet, Take 1 tablet (81 mg total) by mouth daily., Disp: , Rfl:  .  atorvastatin (LIPITOR) 10 MG tablet, Take 1 tablet (10 mg total) by mouth daily at 6 PM., Disp: 90 tablet, Rfl: 1 .  b complex vitamins tablet, Take 1 tablet by mouth daily., Disp: , Rfl:  .  Cholecalciferol (VITAMIN D3) 1.25 MG (50000 UT) TABS, Take 1 tablet by mouth  every 7 (seven) days. every Monday., Disp: 12 tablet, Rfl: 3 .  CRANBERRY PO, Take 300 mg by mouth daily., Disp: , Rfl:  .  [employer] NATURAL LUTEIN EYE HEALTH PO, Take 1 tablet by mouth daily. , Disp: , Rfl:  .  diclofenac sodium (VOLTAREN) 1 % GEL, Apply 4 g topically 4 (four) times daily. To bilateral knees, Disp: 100 g, Rfl: 5 .  furosemide (LASIX) 40 MG tablet, TAKE 1 TABLET BY MOUTH DAILY TO CONTROL EDEMA, Disp: 30 tablet, Rfl: 2 .  gabapentin (NEURONTIN) 100 MG capsule, TAKE TWO CAPSULES BY MOUTH AT BEDTIME TO HELP WITH TREMORS, Disp: 60 capsule, Rfl: 5 .  glucagon (GLUCAGON EMERGENCY) 1 MG injection, Inject 1 mg into the muscle once as needed for up to 1 dose., Disp: 1 each, Rfl: 12 .  HYDROcodone-acetaminophen (NORCO) 7.5-325 MG tablet, Take 1 tablet by mouth every 6 (six) hours as needed for moderate pain., Disp: 120 tablet, Rfl: 0 .  Insulin Glargine (LANTUS SOLOSTAR) 100 UNIT/ML Solostar Pen, Inject 13-14 Units into the skin at bedtime., Disp: 5 pen, Rfl: 11 .  insulin lispro (HUMALOG KWIKPEN) 100 UNIT/ML KwikPen,  Inject 6 units at breakfast, 8 units at lunch and 8 units at dinner, Disp: , Rfl:  .  Insulin Pen Needle (B-D ULTRAFINE III SHORT PEN) 31G X 8 MM MISC, Use with administering insulin. Dx. E11.29, Disp: 100 each, Rfl: 11 .  isosorbide mononitrate (IMDUR) 30 MG 24 hr tablet, TAKE 1 TABLET BY MOUTH EVERY DAY, Disp: 30 tablet, Rfl: 5 .  levothyroxine (SYNTHROID) 125 MCG tablet, TAKE 1 TABLET (125 MCG TOTAL) BY MOUTH DAILY BEFORE BREAKFAST., Disp: 90 tablet, Rfl: 0 .  lidocaine-prilocaine (EMLA) cream, Apply 1 application topically daily as needed., Disp: 30 g, Rfl: 3 .  metoprolol tartrate (LOPRESSOR) 25 MG tablet, TAKE 0.5 TABLETS (12.5 MG TOTAL) BY MOUTH 2 (TWO) TIMES DAILY., Disp: 30 tablet, Rfl: 5 .  Morphine Sulfate (MORPHINE CONCENTRATE) 10 MG/0.5ML SOLN concentrated solution, Place 0.5 mLs (10 mg total) under the tongue every 4 (four) hours as needed for severe pain (Chest pain or anginal equivalent)., Disp: 15 mL, Rfl: 0 .  Multiple Vitamins-Minerals (PRESERVISION AREDS 2 PO), Take 1 tablet by mouth 2 (two) times daily. , Disp: , Rfl:  .  nitroGLYCERIN (NITROSTAT) 0.4 MG SL tablet, Place 1 tablet (0.4 mg total) under the tongue every 5 (five) minutes as needed for chest pain., Disp: 25 tablet, Rfl: 12 .  omeprazole (PRILOSEC) 40 MG capsule, Take 1 capsule (40 mg total) by mouth daily. TAKE ONE (1) CAPSULE BY MOUTH 2 TIMES DAILY FOR STOMACH, Disp: 180 capsule, Rfl: 1 .  ondansetron (ZOFRAN) 4 MG tablet, TAKE ONE TABLET BY MOUTH EVERY 4 HOURS AS NEEDED FOR NAUSEA/VOMITING, Disp: 30 tablet, Rfl: 0 .  ONE TOUCH ULTRA TEST test strip, Use to test sugar three times daily dx E11.29, Disp: 100 each, Rfl: 6 .  Polyethyl Glycol-Propyl Glycol (SYSTANE OP), Place 1 drop into both eyes 2 (two) times daily. , Disp: , Rfl:  .  senna (SENOKOT) 8.6 MG TABS tablet, Take 1 tablet by mouth daily as needed for mild constipation., Disp: , Rfl:  .  triamcinolone cream (KENALOG) 0.1 %, Apply 1 application topically as  needed. Mix with CeraVe cream apply twice daily for dry skin, Disp: , Rfl:  .  trimethoprim (TRIMPEX) 100 MG tablet, TAKE ONE TABLET BY MOUTH EVERY NIGHT AT BEDTIME TO PREVENT BLADDER INFECTION., Disp: 30 tablet, Rfl: 5 .  VITAMIN  E PO, Take 400 Units by mouth daily., Disp: , Rfl:    Observations/Objective: Blood pressure 119/60, weight 132 lb (59.9 kg).   Physical Exam  Physical Exam Constitutional:      General: The patient is not in acute distress. Pulmonary:     Effort: Pulmonary effort is normal. No respiratory distress.  Neurological:     Mental Status: The patient is alert and oriented to person, place, and time.  Psychiatric:        Mood and Affect: Mood normal.        Behavior: Behavior normal.   Assessment and Plan The patient's preventative maintenance and recommended screening tests for an annual wellness exam were reviewed in full today. Brought up to date unless services declined.  Counselled on the importance of diet, exercise, and its role in overall health and mortality. The patient's FH and SH was reviewed, including their home life, tobacco status, and drug and alcohol status.      I discussed the assessment and treatment plan with the patient. The patient was provided an opportunity to ask questions and all were answered. The patient agreed with the plan and demonstrated an understanding of the instructions.   The patient was advised to call back or seek an in-person evaluation if the symptoms worsen or if the condition fails to improve as anticipated.  Essential hypertension   If  BP consistently < 115/65 hold the  Amlodipine daily.  Follow blood pressure at home.. call if issues.    Poorly controlled type 2 diabetes mellitus with autonomic neuropathy ([place])  Followed by ENDO  Paroxysmal A-fib (HCC) Rate controlled. ON ASA anticoagulation.  Dysphagia Refer back to GI for further eval if PPI not helping.  Hypothyroidism Due for  re-eval.  Peripheral edema Can increase furosemide to 1.5 tabs daily if peripheral edmea in is increased. Make sure if taking addiitonal 1/2 tab to take potassium along with it at 20 MEq.  Narcotic dependence ([place]) PDMP reviewed and unremarkable.  Neuropathy due to secondary diabetes (HCC)  Pain in legs. Treat edema and continue gabapentin.  Chronic pain syndrome She uses topical diclofenac and takes hydrocodone 4 times a day..  7.5 /325 mg .        Eliezer Lofts, MD

## 2019-07-25 NOTE — Telephone Encounter (Signed)
[facility] RN with [facility] called Triage regarding 2 issues with pt.   1)  Pt's neuropathic pain in her feet is worsening, the nurse said she realized it correlates to when her leg swelling is worse, then her foot pain is worse. Nance is asking if Dr. Diona Browner would "tweak" her furosemide and maybe increase it to help with the swelling.  2) pt has been vomiting more after she eats. The nurse said it's like as soon as she eats it "comes right back up" she said it reminds her of a patient with gastroparesis. She said it doesn't happen every time she eats but it is happening enough to concern Izora Gala. Pt is already taking zofran about 30 min before she eats but it's not helping. Izora Gala is asking if Dr. Diona Browner could prescribe anything else to help, maybe something to help her stomach so her food will "pass better".  Izora Gala is asking that Dr. Diona Browner send any new Rxs to [place] and then call her to let her know the plan. Izora Gala CB# [phone]

## 2019-07-25 NOTE — Patient Instructions (Addendum)
Can increase furosemide to 1.5 tabs daily if peripheral edmea in is increased. Make sure if taking addiitonal 1/2 tab to take potassium along with it at 20 MEq.  If  BP consistently < 115/65 hold the  Amlodipine daily.  Follow blood pressure at home.. call if issues.  Hold zofran. Lab appt in 2 weeks.

## 2019-08-06 ENCOUNTER — Telehealth: Payer: Self-pay

## 2019-08-06 MED ORDER — ONDANSETRON HCL 4 MG PO TABS: ORAL_TABLET | ORAL | 3 refills | Status: AC

## 2019-08-06 NOTE — Telephone Encounter (Signed)
Refills sent as instructed by Dr. Diona Browner.  Izora Gala with [facility] notified by telephone.  While on the phone Izora Gala states Ms. Marcou is up for re-certification for hospice services and was asking for verbal order.  Verbal order given to re-certify patient for hospice services.  FYI to Dr. Diona Browner.

## 2019-08-06 NOTE — Telephone Encounter (Signed)
Okay to refill zofran #30, 3 RF

## 2019-08-06 NOTE — Telephone Encounter (Signed)
Karen [name] with [facility] left v/m that zofran was put on hold for 07/25/19 visit. On 08/03/18 pt had nausea and vomiting. Karen [name] request refill on Zofran because it does help nausea or give different med for nausea. Karen [name] request cb.

## 2019-08-06 NOTE — Telephone Encounter (Signed)
Agree with hospice recert.

## 2019-08-15 ENCOUNTER — Encounter: Payer: Self-pay | Admitting: Internal Medicine

## 2019-08-15 ENCOUNTER — Other Ambulatory Visit: Payer: Self-pay

## 2019-08-15 ENCOUNTER — Ambulatory Visit (INDEPENDENT_AMBULATORY_CARE_PROVIDER_SITE_OTHER): Admitting: Internal Medicine

## 2019-08-15 DIAGNOSIS — E039 Hypothyroidism, unspecified: Secondary | ICD-10-CM | POA: Diagnosis not present

## 2019-08-15 NOTE — Patient Instructions (Addendum)
Please change: - Humalog 4-6 units before each meal (hold Humalog if sugars <90 or if she is not eating)  Please move: - Lantus 6-8 units to am  Continue the following combination for neuropathy: - alpha-lipoic acid 600 mg 2x a day - B complex 1 tablet a day >> stop 1 week before labs  Continue levothyroxine 125 mcg daily.  Take the thyroid hormone every day, with water, at least 30 minutes before breakfast, separated by at least 4 hours from: - acid reflux medications - calcium - iron - multivitamins  Please return in 3 months with your sugar log.

## 2019-08-15 NOTE — Progress Notes (Signed)
Patient ID: Karen [name], female   DOB: 07-Nov-1923, 86 y.o.   MRN: [id number]   Patient location: Home My location: Office Persons participating in the virtual visit: patient, her son, provider  Referring Provider: Jinny Sanders, MD  I connected with the patient on 08/15/19 at  2:50 PM EST by a video enabled telemedicine application and verified that I am speaking with the correct person.   I discussed the limitations of evaluation and management by telemedicine and the availability of in person appointments. The patient expressed understanding and agreed to proceed.   Details of the encounter are shown below.  HPI: Karen [name] is a 86 y.o.-year-old female, initially referred by her PCP, Karen [name], presenting for follow-up for DM2, dx in 1984, insulin-dependent since ~2000, uncontrolled, with complications (CKD, PN, macular degeneration).  Last visit 5 months ago. Her son is offering most of the information including about blood sugars, insulin doses, and her diet.  She is eating small meals as she cannot chew well.  Reviewed her HbA1c levels: Lab Results  Component Value Date   HGBA1C 5.2 07/18/2019   HGBA1C 6.1 (A) 03/21/2019   HGBA1C 6.6 (H) 08/03/2018   HGBA1C 8.0 (H) 03/22/2018   HGBA1C 7.1 (H) 11/21/2017   HGBA1C 6.7 (H) 07/11/2017   HGBA1C 7.7 (H) 03/22/2017   HGBA1C 6.5 (H) 12/19/2016   HGBA1C 6.6 (H) 09/15/2016   HGBA1C 5.8 (H) 07/18/2016   Pt is on a regimen of: - Lantus 42 >> 35 >> 28-30 >> 16 >> 13-14 >> 8 (in last 7 days: 4-6) almost incapacitated and in NAD it but the units at bedtime  - Humalog 05-15-15 (19 if has dessert) units before each meal >> 05-17-15 units >> 6-8-8 >> 6-6-6 units before meals (holding the insulin for sugars <120)  Pt checks her sugars 4 times a day: - am: 50, 54-154, 194 >> 70-110 (ave 80), 363 (steroids) >> 99-135 - 2h after b'fast: n/c - before lunch: 100-150 >> n/c >> 80-150 >> 83-185 - 2h after lunch: n/c - before  dinner: 54-220, last 4 days: 50-80s >> 80-175 >> 125-225 - 2h after dinner: 190-220 >> 100-200s  >> 125-200 >> 50, 95-175 - bedtime: n/c  - nighttime: n/c Lowest sugar was 37 - mid-day - took Humalog but did not eat .Marland KitchenMarland Kitchen49 lunchtime (rarely) >> 50; she has hypoglycemia awareness in the 60s.  Highest sugar was 600 - steroid inj >> ... 363 >> 200s.  She lives with her son.  She is under hospice care now for her heart condition.  Glucometer: One Touch ultra  Pt's meals are: - Breakfast: cereal (Rice crispies) + 2% mild + toast + apple sauce + diet pepsi - Lunch: cheese or ham sandwich with tomatoes + diet pepsi  - Dinner: meat + 2 veggies + sometimes dessert + diet pepsi - Snacks: rice crispies + icecream bedtime - every other night; cheese puffs  -+ CKD, last BUN/creatinine:  Lab Results  Component Value Date   BUN 42 (H) 07/18/2019   BUN 43 (H) 03/21/2019   CREATININE 2.16 (H) 07/18/2019   CREATININE 2.04 (H) 03/21/2019   -+ HL; last set of lipids: Lab Results  Component Value Date   CHOL 150 03/21/2019   HDL 41.40 03/21/2019   LDLCALC 91 03/21/2019   LDLDIRECT 122.0 03/22/2018   TRIG 88.0 03/21/2019   CHOLHDL 4 03/21/2019  On Lipitor.  - last eye exam was in 04/2019: No DR but macular degeneration in her  left eye. Dr. Bing Plume.  -She has numbness and tingling, and also pain in her feet -she usually puts ice on her feet at night.  She is on Neurontin but this is used for her tremors. She takes 2 tabs at bedtime.  In the past I suggested alpha-lipoic acid and B complex and she started on these.  She feels that these are helping.  Postsurgical hypothyroidism:  Pt is on levothyroxine 125 mcg daily, taken: - in am - fasting - at least 30 min from b'fast - no Ca, Fe, MVI - we moved Protonix with lunch at last visit  - + on Biotin  Latest TSH was normal: Lab Results  Component Value Date   TSH 3.56 03/22/2018   Her son (my pt) has a history of thyroid  cancer.  ROS: Constitutional: no weight gain/no weight loss, no fatigue, no subjective hyperthermia, no subjective hypothermia Eyes: no blurry vision, no xerophthalmia ENT: no sore throat, no nodules palpated in neck, no dysphagia, no odynophagia, no hoarseness Cardiovascular: no CP/no SOB/no palpitations/no leg swelling Respiratory: no cough/no SOB/no wheezing Gastrointestinal: no N/no V/no D/no C/no acid reflux Musculoskeletal: no muscle aches/no joint aches Skin: no rashes, no hair loss Neurological: no tremors/+ numbness/+ tingling/no dizziness  I reviewed pt's medications, allergies, PMH, social hx, family hx, and changes were documented in the history of present illness. Otherwise, unchanged from my initial visit note.  Past Medical History:  Diagnosis Date  . A-fib ([place])   . Abnormal involuntary movements(781.0)   . Anemia   . Anxiety   . Anxiety state, unspecified   . Arthritis    body, neck  . Benign hypertensive heart disease   . Benign hypertensive heart disease with congestive heart failure ([place])   . Bladder disorder    over active  . Cancer ([place])    skin, removed  . Cervicalgia   . CHF (congestive heart failure) ([place])   . Chronic kidney disease, stage III (moderate)   . Congestive heart failure, unspecified   . Contact dermatitis and other eczema, due to unspecified cause   . Corns and callosities   . Diabetes mellitus without complication ([place])   . Diarrhea   . Dyspnea 02/18/2015  . Dysuria 10/01/2018  . Edema   . Esophageal stricture   . Gastroparesis   . Gastroparesis   . GERD (gastroesophageal reflux disease)   . Hiatal hernia   . Hyperlipidemia   . Inflamed seborrheic keratosis   . Inflammatory disease of breast   . Irregular heartbeat   . Lumbago   . Mixed incontinence urge and stress (female)(female)   . Nausea alone   . Nonspecific (abnormal) findings on radiological and other examination of skull and head   . Osteoarthrosis, unspecified whether  generalized or localized, unspecified site   . Other and unspecified hyperlipidemia   . Other B-complex deficiencies   . Other functional disorder of bladder   . Other malaise and fatigue   . Other specified visual disturbances   . Pain in joint, lower leg   . Palpitations   . Peripheral vascular disease, unspecified ([place])   . Postmenopausal atrophic vaginitis   . Reflux esophagitis   . Seborrheic keratosis   . Sleep apnea   . Spasm of muscle   . Spinal stenosis   . Spinal stenosis, unspecified region other than cervical   . Stricture and stenosis of esophagus   . Stroke ([place])   . TIA (transient ischemic attack) 2012  . Transient  ischemic attack (TIA), and cerebral infarction without residual deficits(V12.54)   . Type II or unspecified type diabetes mellitus with renal manifestations, not stated as uncontrolled(250.40)   . Unspecified constipation   . Unspecified essential hypertension   . Unspecified hereditary and idiopathic peripheral neuropathy   . Unspecified hypothyroidism   . Unspecified pruritic disorder   . Unspecified sleep apnea   . Unspecified vitamin D deficiency   . Urinary tract infection, site not specified   . Vitamin B12 deficiency    Past Surgical History:  Procedure Laterality Date  . ABDOMINAL HYSTERECTOMY    . APPENDECTOMY    . BACK SURGERY     x 2  . CESAREAN SECTION    . CHOLECYSTECTOMY  1991  . esophageal  2004,2006   dilation, Dr Lyla Son  . [place]   bilateral cataract  . hammer toes    . HERNIA REPAIR  1984aaaaaaaa  . SKIN CANCER DESTRUCTION    . THYROIDECTOMY, PARTIAL  1965   Social History   Socioeconomic History  . Marital status: Widowed    Spouse name: Not on file  . Number of children: 3  . Years of education: Not on file  . Highest education level: Not on file  Occupational History  . Occupation: retired  Tobacco Use  . Smoking status: Never Smoker  . Smokeless tobacco: Never Used  Substance and Sexual  Activity  . Alcohol use: No  . Drug use: No  . Sexual activity: Never  Other Topics Concern  . Not on file  Social History Narrative  . Not on file   Social Determinants of Health   Financial Resource Strain: Low Risk   . Difficulty of Paying Living Expenses: Not hard at all  Food Insecurity: No Food Insecurity  . Worried About Charity fundraiser in the Last Year: Never true  . Ran Out of Food in the Last Year: Never true  Transportation Needs: No Transportation Needs  . Lack of Transportation (Medical): No  . Lack of Transportation (Non-Medical): No  Physical Activity: Inactive  . Days of Exercise per Week: 0 days  . Minutes of Exercise per Session: 0 min  Stress: No Stress Concern Present  . Feeling of Stress : Not at all  Social Connections:   . Frequency of Communication with Friends and Family: Not on file  . Frequency of Social Gatherings with Friends and Family: Not on file  . Attends Religious Services: Not on file  . Active Member of Clubs or Organizations: Not on file  . Attends Archivist Meetings: Not on file  . Marital Status: Not on file  Intimate Partner Violence: Not At Risk  . Fear of Current or Ex-Partner: No  . Emotionally Abused: No  . Physically Abused: No  . Sexually Abused: No   Current Outpatient Medications on File Prior to Visit  Medication Sig Dispense Refill  . Alpha-Lipoic Acid 600 MG TABS Take 1 tablet by mouth 2 (two) times a day.    . ALPRAZolam (XANAX) 0.5 MG tablet Take 1 tablet (0.5 mg total) by mouth 3 (three) times daily as needed. 90 tablet 0  . amLODipine (NORVASC) 2.5 MG tablet TAKE 1 TABLET BY MOUTH EVERY DAY 30 tablet 5  . aspirin EC 81 MG EC tablet Take 1 tablet (81 mg total) by mouth daily.    Marland Kitchen atorvastatin (LIPITOR) 10 MG tablet Take 1 tablet (10 mg total) by mouth daily at 6 PM.  90 tablet 1  . b complex vitamins tablet Take 1 tablet by mouth daily.    . Cholecalciferol (VITAMIN D3) 1.25 MG (50000 UT) TABS Take 1  tablet by mouth every 7 (seven) days. every Monday. 12 tablet 3  . CRANBERRY PO Take 300 mg by mouth daily.    . [facility] NATURAL LUTEIN EYE HEALTH PO Take 1 tablet by mouth daily.     . diclofenac sodium (VOLTAREN) 1 % GEL Apply 4 g topically 4 (four) times daily. To bilateral knees 100 g 5  . furosemide (LASIX) 40 MG tablet TAKE 1 TABLET BY MOUTH DAILY, can take additional 1/2 tab if needed for edema 45 tablet 2  . gabapentin (NEURONTIN) 100 MG capsule TAKE TWO CAPSULES BY MOUTH AT BEDTIME TO HELP WITH TREMORS 60 capsule 5  . glucagon (GLUCAGON EMERGENCY) 1 MG injection Inject 1 mg into the muscle once as needed for up to 1 dose. 1 each 12  . HYDROcodone-acetaminophen (NORCO) 7.5-325 MG tablet Take 1 tablet by mouth every 6 (six) hours as needed for moderate pain. 120 tablet 0  . Insulin Glargine (LANTUS SOLOSTAR) 100 UNIT/ML Solostar Pen Inject 13-14 Units into the skin at bedtime. 5 pen 11  . insulin lispro (HUMALOG KWIKPEN) 100 UNIT/ML KwikPen Inject 6 units at breakfast, 8 units at lunch and 8 units at dinner    . Insulin Pen Needle (B-D ULTRAFINE III SHORT PEN) 31G X 8 MM MISC Use with administering insulin. Dx. E11.29 100 each 11  . isosorbide mononitrate (IMDUR) 30 MG 24 hr tablet TAKE 1 TABLET BY MOUTH EVERY DAY 30 tablet 5  . levothyroxine (SYNTHROID) 125 MCG tablet TAKE 1 TABLET (125 MCG TOTAL) BY MOUTH DAILY BEFORE BREAKFAST. 90 tablet 0  . lidocaine-prilocaine (EMLA) cream Apply 1 application topically daily as needed. 30 g 3  . metoCLOPramide (REGLAN) 5 MG tablet Take 1 tablet (5 mg total) by mouth 3 (three) times daily before meals. 90 tablet 3  . metoprolol tartrate (LOPRESSOR) 25 MG tablet TAKE 0.5 TABLETS (12.5 MG TOTAL) BY MOUTH 2 (TWO) TIMES DAILY. 30 tablet 5  . Morphine Sulfate (MORPHINE CONCENTRATE) 10 MG/0.5ML SOLN concentrated solution Place 0.5 mLs (10 mg total) under the tongue every 4 (four) hours as needed for severe pain (Chest pain or anginal equivalent). 15 mL 0  .  Multiple Vitamins-Minerals (PRESERVISION AREDS 2 PO) Take 1 tablet by mouth 2 (two) times daily.     . nitroGLYCERIN (NITROSTAT) 0.4 MG SL tablet Place 1 tablet (0.4 mg total) under the tongue every 5 (five) minutes as needed for chest pain. 25 tablet 12  . omeprazole (PRILOSEC) 40 MG capsule Take 1 capsule (40 mg total) by mouth daily. TAKE ONE (1) CAPSULE BY MOUTH 2 TIMES DAILY FOR STOMACH 180 capsule 1  . ondansetron (ZOFRAN) 4 MG tablet TAKE ONE TABLET BY MOUTH EVERY 4 HOURS AS NEEDED FOR NAUSEA/VOMITING 30 tablet 3  . ONE TOUCH ULTRA TEST test strip Use to test sugar three times daily dx E11.29 100 each 6  . Polyethyl Glycol-Propyl Glycol (SYSTANE OP) Place 1 drop into both eyes 2 (two) times daily.     . potassium chloride SA (KLOR-CON) 20 MEQ tablet Take 1 tablet (20 mEq total) by mouth daily. Use when taking additional 1/2 tablet of furosemide. 30 tablet 0  . senna (SENOKOT) 8.6 MG TABS tablet Take 1 tablet by mouth daily as needed for mild constipation.    . triamcinolone cream (KENALOG) 0.1 % Apply 1 application  topically as needed. Mix with CeraVe cream apply twice daily for dry skin    . trimethoprim (TRIMPEX) 100 MG tablet TAKE ONE TABLET BY MOUTH EVERY NIGHT AT BEDTIME TO PREVENT BLADDER INFECTION. 30 tablet 5  . VITAMIN E PO Take 400 Units by mouth daily.     No current facility-administered medications on file prior to visit.   Allergies  Allergen Reactions  . Celebrex [Celecoxib] Rash  . Cymbalta [Duloxetine Hcl] Rash    Lip numbness,rash, leg and body jerks. Patient states " I though I was having a heart attack or stroke."   . Doxycycline Rash  . Lyrica [Pregabalin] Rash  . Avandia [Rosiglitazone] Rash  . Macrodantin [Nitrofurantoin Macrocrystal] Rash  . Silicone Rash    Gets rash from the touch it.  . Sulfa Antibiotics Rash  . Vioxx [Rofecoxib] Rash   Family History  Problem Relation Age of Onset  . Cancer Mother        ? stomach  . Heart disease Father   .  Hyperlipidemia Father   . Hypertension Father   . Heart attack Father   . Heart disease Brother   . Kidney disease Daughter   . Heart disease Brother   . Cancer Son   Daughter has a history of skin cancer while son has a history of thyroid cancer.  PE: BP 117/57 There were no vitals taken for this visit. Wt Readings from Last 3 Encounters:  07/25/19 132 lb (59.9 kg)  03/21/19 136 lb (61.7 kg)  03/05/19 138 lb (62.6 kg)   Constitutional:  in NAD  The physical exam was not performed (virtual visit).  ASSESSMENT: 1. DM2, insulin-dependent, uncontrolled, with long-term complications - CKD - PN - macular degeneration  2. HL  3. Hypothyroidism  PLAN:  1. Patient with longstanding, uncontrolled, type 2 diabetes, on basal-bolus insulin regimen, with lower blood sugars at last visit, as low as 50s after which we reduced the Lantus dose and we continue the same dose of Humalog.  She did have some blood sugars in the 300s after a steroid injection at that time, but they did improve before our visit. -Since then, he had another HbA1c which was much lower, at 5.2% last month.  She also lost 6 pounds since last visit so probably her insulin resistance improved even more. -At this visit, her son describes the patient had some low blood sugars, one in the 50s and the rest higher, around 100, but she feels incapacitated with these.  They mostly occur in the morning but the most severe hypoglycemic episode happened in the evening. -We discussed about lowering the dose of Humalog further so that she can take it evening her sugars are at goal without the danger of hypoglycemia afterwards.  Since she feels that her sugars are lowest in the morning, we will move her Lantus from evening to morning. - I suggested to:  Patient Instructions  Please change: - Humalog 4-6 units before each meal (hold Humalog if sugars <90 or if she is not eating)  Please move: - Lantus 6-8 units to am  Continue the  following combination for neuropathy: - alpha-lipoic acid 600 mg 2x a day - B complex 1 tablet a day >> stop 1 week before labs  Continue levothyroxine 125 mcg daily.  Take the thyroid hormone every day, with water, at least 30 minutes before breakfast, separated by at least 4 hours from: - acid reflux medications - calcium - iron - multivitamins  Please return  in 3 months with your sugar log.   - CBG targets for treatment: 90-150 mg/dL before meals and 200mg /dL after meals; target HbA1c <7.5% -due to age. - advised to check sugars at different times of the day - 3x a day, rotating check times - advised for yearly eye exams >> she is UTD - return to clinic in 3 months  2. HL -Reviewed latest lipid panel from 03/2019: LDL improved, now at goal, the rest of the fractions also at goal Lab Results  Component Value Date   CHOL 150 03/21/2019   HDL 41.40 03/21/2019   LDLCALC 91 03/21/2019   LDLDIRECT 122.0 03/22/2018   TRIG 88.0 03/21/2019   CHOLHDL 4 03/21/2019  -She continues on Lipitor 10 without side effects  3. Hypothyroidism - latest thyroid labs reviewed with pt >> normal, but the latest TSH is from 03/2018: Lab Results  Component Value Date   TSH 3.56 03/22/2018  -We could not check her TFTs at last visit that she was taking the PPI too close to levothyroxine, she was on biotin, and just started prednisone. - she continues on LT4 125 mcg daily - pt feels good on this dose. - we discussed about taking the thyroid hormone every day, with water, >30 minutes before breakfast, separated by >4 hours from acid reflux medications, calcium, iron, multivitamins. Pt. is taking it correctly now.  At last visit, she was taking omeprazole 30 minutes after levothyroxine and I advised her to move the omeprazole with lunch.   - will check thyroid tests when she can return to the lab: TSH and fT4 - If labs are abnormal, she will need to return for repeat TFTs in 1.5 months  Philemon Kingdom, MD PhD [facility] Endocrin[facility]

## 2019-08-18 ENCOUNTER — Other Ambulatory Visit: Payer: Self-pay | Admitting: Family Medicine

## 2019-08-21 ENCOUNTER — Other Ambulatory Visit: Payer: Self-pay

## 2019-08-21 ENCOUNTER — Other Ambulatory Visit: Payer: Medicare Other

## 2019-08-21 ENCOUNTER — Other Ambulatory Visit: Payer: Self-pay | Admitting: Family Medicine

## 2019-08-21 DIAGNOSIS — G894 Chronic pain syndrome: Secondary | ICD-10-CM

## 2019-08-21 NOTE — Telephone Encounter (Signed)
Last office visit 07/25/2019 for CPE.  Last refilled 07/23/2019 for #120 with no refills.  No future appointments.

## 2019-08-21 NOTE — Telephone Encounter (Signed)
Last office visit 07/25/2019 for CPE.  Last refilled 07/23/2019 for #90 with no refills.  No future appointments.

## 2019-08-22 ENCOUNTER — Other Ambulatory Visit: Payer: Medicare Other

## 2019-08-22 MED ORDER — HYDROCODONE-ACETAMINOPHEN 7.5-325 MG PO TABS: 1.0000 | ORAL_TABLET | Freq: Four times a day (QID) | ORAL | 0 refills | Status: DC | PRN

## 2019-08-26 NOTE — Assessment & Plan Note (Signed)
She uses topical diclofenac and takes hydrocodone 4 times a day..  7.5 /325 mg .

## 2019-08-26 NOTE — Assessment & Plan Note (Signed)
Due for re-eval. 

## 2019-08-26 NOTE — Assessment & Plan Note (Signed)
Can increase furosemide to 1.5 tabs daily if peripheral edmea in is increased. Make sure if taking addiitonal 1/2 tab to take potassium along with it at 20 MEq.

## 2019-08-26 NOTE — Assessment & Plan Note (Signed)
PDMP reviewed and unremarkable.

## 2019-08-26 NOTE — Assessment & Plan Note (Signed)
Refer back to GI for further eval if PPI not helping.

## 2019-08-26 NOTE — Assessment & Plan Note (Signed)
Pain in legs. Treat edema and continue gabapentin.

## 2019-08-27 ENCOUNTER — Telehealth: Payer: Self-pay

## 2019-08-27 MED ORDER — ALPRAZOLAM 0.5 MG PO TABS: 0.5000 mg | ORAL_TABLET | Freq: Four times a day (QID) | ORAL | 0 refills | Status: DC | PRN

## 2019-08-27 NOTE — Telephone Encounter (Signed)
Karen [name] notified okay to increase xanax to four times a day prn.  She states they will need to [facility] [place].  Please sign refill under unsigned orders.

## 2019-08-27 NOTE — Telephone Encounter (Signed)
Okay to make change as requested.. is new rx needed?

## 2019-08-27 NOTE — Telephone Encounter (Signed)
Patients son [name] called today to cancel lab appointment due to his mother being unable travel-he will have Dr. Valorie Roosevelt draw labs and if they can not Home nurse will draw appropriate labs and fax results-fax number was given-FYI

## 2019-08-27 NOTE — Telephone Encounter (Signed)
Karen [name] with [facility] left v/m; pt had rough weekend with cardiac episode and pt is still declining. [name] request to increase xanax 0.5 mg from tid to qid.Please advise.

## 2019-08-27 NOTE — Telephone Encounter (Signed)
Noted  

## 2019-08-28 ENCOUNTER — Other Ambulatory Visit: Payer: Medicare Other

## 2019-08-29 ENCOUNTER — Telehealth: Payer: Self-pay | Admitting: Internal Medicine

## 2019-08-29 ENCOUNTER — Other Ambulatory Visit: Payer: Self-pay

## 2019-08-29 DIAGNOSIS — E039 Hypothyroidism, unspecified: Secondary | ICD-10-CM

## 2019-08-29 NOTE — Telephone Encounter (Signed)
Kieth Brightly, could you help with this please?

## 2019-08-29 NOTE — Addendum Note (Signed)
Addended by: Kaylyn Lim I on: 08/29/2019 02:15 PM   Modules accepted: Orders

## 2019-08-29 NOTE — Telephone Encounter (Signed)
Benjamine Mola with [facility] ph# [phone] called re: Fax# for Order for Labs is Fax# [phone]

## 2019-09-04 DIAGNOSIS — E039 Hypothyroidism, unspecified: Secondary | ICD-10-CM | POA: Diagnosis not present

## 2019-09-10 ENCOUNTER — Other Ambulatory Visit: Payer: Self-pay | Admitting: Internal Medicine

## 2019-09-10 DIAGNOSIS — E039 Hypothyroidism, unspecified: Secondary | ICD-10-CM

## 2019-09-10 MED ORDER — LEVOTHYROXINE SODIUM 112 MCG PO TABS: 112.0000 ug | ORAL_TABLET | Freq: Every day | ORAL | 3 refills | Status: AC

## 2019-09-10 NOTE — Progress Notes (Signed)
Received labs from PCP, drawn on 09/06/2019: TSH 0.296 (0.45-4.5), total T4 5.5 (4.5-12) He is currently on 125 mcg of levothyroxine daily. We will decrease the dose to 112 mcg daily and recheck her test in 5 weeks.

## 2019-09-11 NOTE — Progress Notes (Signed)
Notified family of med change and labs.

## 2019-09-15 ENCOUNTER — Ambulatory Visit: Payer: Medicare Other | Attending: Internal Medicine

## 2019-09-15 DIAGNOSIS — Z23 Encounter for immunization: Secondary | ICD-10-CM

## 2019-09-15 NOTE — Progress Notes (Signed)
   Covid-19 Vaccination Clinic  Name:  CATHY CROUNSE    MRN: [id number] DOB: 1924/03/20  09/15/2019  Ms. Speedy was observed post Covid-19 immunization for 15 minutes without incidence. She was provided with Vaccine Information Sheet and instruction to access the V-Safe system.   Ms. Robeck was instructed to call 911 with any severe reactions post vaccine: Marland Kitchen Difficulty breathing  . Swelling of your face and throat  . A fast heartbeat  . A bad rash all over your body  . Dizziness and weakness    Immunizations Administered    Name Date Dose VIS Date Route   Pfizer COVID-19 Vaccine 09/15/2019  2:48 PM 0.3 mL 07/12/2019 Intramuscular   Manufacturer: [place]   Lot: [id number]   [place]: [id number]

## 2019-09-18 DIAGNOSIS — M170 Bilateral primary osteoarthritis of knee: Secondary | ICD-10-CM | POA: Diagnosis not present

## 2019-09-25 ENCOUNTER — Other Ambulatory Visit: Payer: Self-pay | Admitting: *Deleted

## 2019-09-25 DIAGNOSIS — G894 Chronic pain syndrome: Secondary | ICD-10-CM

## 2019-09-25 MED ORDER — HYDROCODONE-ACETAMINOPHEN 7.5-325 MG PO TABS: 1.0000 | ORAL_TABLET | Freq: Four times a day (QID) | ORAL | 0 refills | Status: AC | PRN

## 2019-09-25 MED ORDER — ALPRAZOLAM 0.5 MG PO TABS: 0.5000 mg | ORAL_TABLET | Freq: Four times a day (QID) | ORAL | 0 refills | Status: AC | PRN

## 2019-09-25 NOTE — Telephone Encounter (Signed)
Izora Gala nurse with [facility] left a voicemail stating that patient needs a refill on her Hydrocodone and Alprazolam Izora Gala said to call her back if you had any questions? Hydrocodone last refill 08/22/19 #120 Alprazolam last refill 08/27/19 #120 Last office visit 07/25/19

## 2019-09-25 NOTE — Telephone Encounter (Addendum)
I do not see where a MyChart request was received from son but will forward to Dr. Lorelei Pont to see if he will be willing to refill medications.   Ms. Karen [name] is a hospice patient.

## 2019-09-25 NOTE — Telephone Encounter (Signed)
Patient's son,Dodd,called.  He said he requested medication 2 days ago on my chart and he asked [facility] to call and help him get the medication.  Patient's out of her medication. [name] is requesting medication be called in today.

## 2019-09-25 NOTE — Telephone Encounter (Signed)
Dodd notified by telephone that [facility] [place].

## 2019-09-27 ENCOUNTER — Telehealth: Payer: Self-pay

## 2019-09-27 MED ORDER — TRIAMCINOLONE ACETONIDE 0.1 % EX CREA: 1.0000 "application " | TOPICAL_CREAM | CUTANEOUS | 1 refills | Status: DC | PRN

## 2019-09-27 MED ORDER — TRIAMCINOLONE ACETONIDE 0.1 % EX CREA: 1.0000 "application " | TOPICAL_CREAM | CUTANEOUS | 1 refills | Status: AC | PRN

## 2019-09-27 NOTE — Addendum Note (Signed)
Addended by: Eliezer Lofts E on: 09/27/2019 05:31 PM   Modules accepted: Orders

## 2019-09-27 NOTE — Telephone Encounter (Signed)
Agree with holding amlodipine. Can hold metoprolol but may need restart if heart rate increasing.  Consider decreasing lasix to 1/2 tab daily  if dehydration a concern and BP remaining low  Any sign/symptoms of infection?

## 2019-09-27 NOTE — Telephone Encounter (Signed)
Okay to give verbal re certification for hospice.  Refilled triamcinolone.

## 2019-09-27 NOTE — Telephone Encounter (Signed)
Left message for Karen [name] that Dr. Diona Browner agrees with holding amlodipine. Can hold metoprolol but may need restart if heart rate is  Increasing.   Consider decreasing lasix to 1/2 tab daily, if dehydration is a concern and BP remaining low.  I ask that she call us back if she feels Karen [name] is exhibiting any sign/symptoms of infection.

## 2019-09-27 NOTE — Telephone Encounter (Signed)
Left message giving verbal orders to [facility] for re-certification for hospice care.  Triamcinolone refilled as instructed by Dr. Diona Browner.

## 2019-09-27 NOTE — Addendum Note (Signed)
Addended by: Carter Kitten on: 09/27/2019 05:35 PM   Modules accepted: Orders

## 2019-09-27 NOTE — Telephone Encounter (Signed)
Karen [name] called back.   1. Said she got the message and will decrease the Lasix.  2. She is up for recertification and needs a verbal order to do that.  3. She does not think she has an infection. Says her pain is 8 out of 10 the last few times Karen [name] has been there. She has had to take Nitroglycerin several times in the last few weeks and that has help her chest pain to subside.  4. Asking for a refill of triamcinolone cream a previous doctor had written. Wanted to know if Dr Diona Browner would be willing to do it. Send to [place].

## 2019-09-27 NOTE — Telephone Encounter (Signed)
Karen [name] with [facility] called and left a message on Triage Line with update on pt. She is having a significant decline in status. Very weak, increased sleeping during the day, BP 82/47 on 09-25-19 so they held amlodipine. Today BP was 78/46. Plan to hold metoprolol if Dr Diona Browner agrees or has suggestions. Still has nausea after she eats so her appetite has declined. Please advise on BP and anything else  At [phone]

## 2019-10-05 ENCOUNTER — Other Ambulatory Visit: Payer: Self-pay | Admitting: Family Medicine

## 2019-10-07 ENCOUNTER — Telehealth: Payer: Self-pay | Admitting: *Deleted

## 2019-10-07 NOTE — Telephone Encounter (Signed)
Abigail Butts nurse with [facility] left a voicemail stating that she saw patient and she has an unstagable wound on the left side of her gluteal cease.Abigail Butts stated that she applied a chromosome patch for cushioning. Abigail Butts stated that the family is requesting an antibiotic because patient is complaining of it being painful. Pharmacy [facility]

## 2019-10-08 ENCOUNTER — Ambulatory Visit: Payer: Medicare Other

## 2019-10-08 ENCOUNTER — Other Ambulatory Visit: Payer: Self-pay | Admitting: Family Medicine

## 2019-10-08 MED ORDER — CEPHALEXIN 500 MG PO CAPS: 500.0000 mg | ORAL_CAPSULE | Freq: Three times a day (TID) | ORAL | 0 refills | Status: AC

## 2019-10-08 MED ORDER — FLUCONAZOLE 150 MG PO TABS: 150.0000 mg | ORAL_TABLET | Freq: Once | ORAL | 0 refills | Status: AC

## 2019-10-08 NOTE — Telephone Encounter (Signed)
Also see MyChart message.  The family sent a picture of the area.

## 2019-10-08 NOTE — Telephone Encounter (Signed)
Left message for Karen [name] with [facility] that per Dr. Diona Browner, it does look like the wound may be infected. (family sent picture of area).  Dr. Diona Browner will send in a precription for an antibitoic and for diflucan in case she get a yeast infection. If redness is spreading or fever.. Ms. Karen [name] will need to be seen in person.  I ask that she continue to follow this area closely.

## 2019-10-08 NOTE — Telephone Encounter (Signed)
Call as FYI to [place].  I sent Mychart message to patient..  It does look like the wound may be infected. I will send in a precription for an antibitoic and for diflucan in case she get a yeast infection. If redness spreading or fever.. she will need to be seen in person. Please continue to follow this area closely.

## 2019-10-09 ENCOUNTER — Ambulatory Visit: Payer: Medicare Other

## 2019-10-15 ENCOUNTER — Other Ambulatory Visit: Payer: Self-pay | Admitting: Family Medicine

## 2019-10-15 NOTE — Telephone Encounter (Signed)
Last office visit 07/25/2019 for CPE.  Last refilled 04/26/2019 for #60 with 5 refills.  No future refills.

## 2019-10-24 ENCOUNTER — Telehealth: Payer: Self-pay | Admitting: Family Medicine

## 2019-10-31 NOTE — Telephone Encounter (Signed)
Makeala with [facility] called. She wanted to make Dr Diona Browner aware that the patient passed away this morning   C/B # [phone]

## 2019-10-31 DEATH — deceased

## 2019-11-27 ENCOUNTER — Other Ambulatory Visit: Payer: Self-pay | Admitting: Family Medicine

## 2019-12-23 IMAGING — CR DG LUMBAR SPINE COMPLETE 4+V
6 series · 6 of 6 positions shown · non-contrast
Comparison: CT 01/20/2012.

CLINICAL DATA: Fall.

EXAM:
LUMBAR SPINE - COMPLETE 4+ VIEW

[l-spine ap]
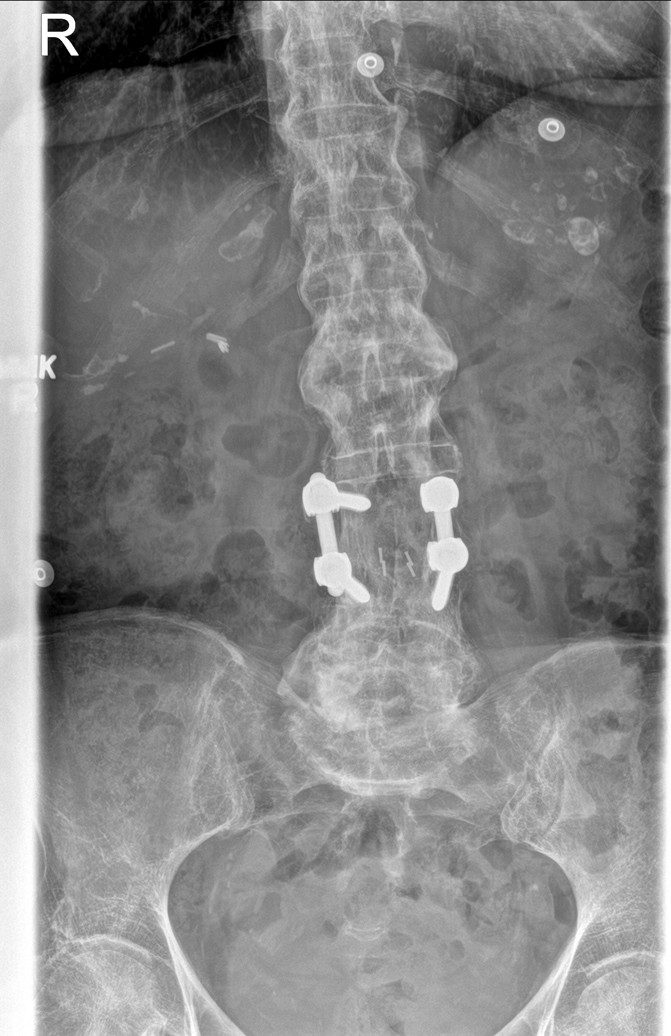

[l-spine obl (1 of 3)]
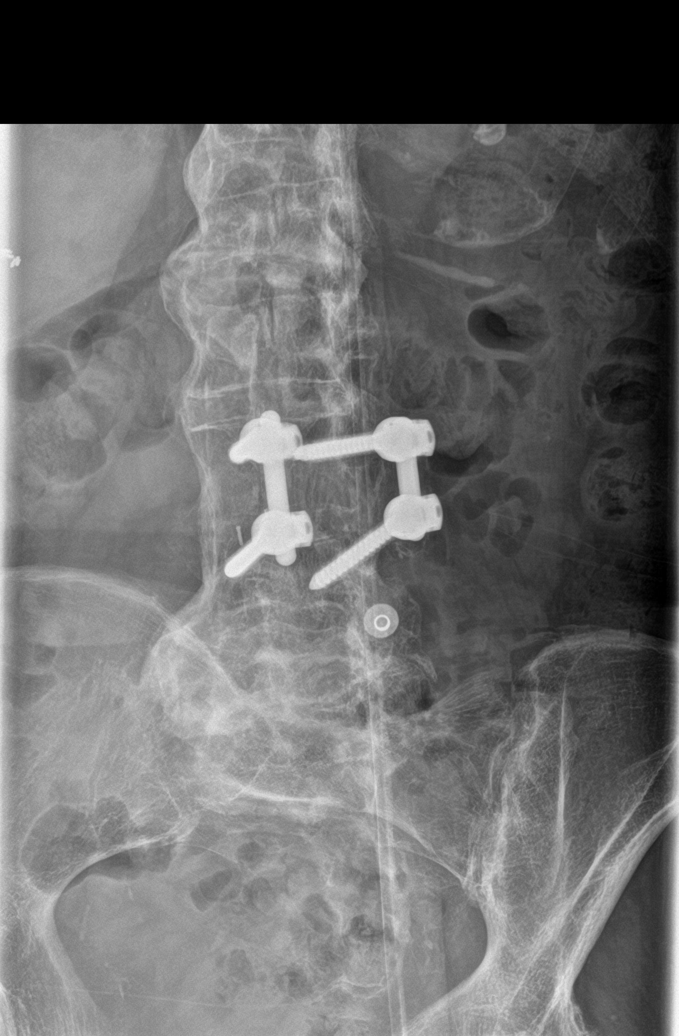

[l-spine obl (2 of 3)]
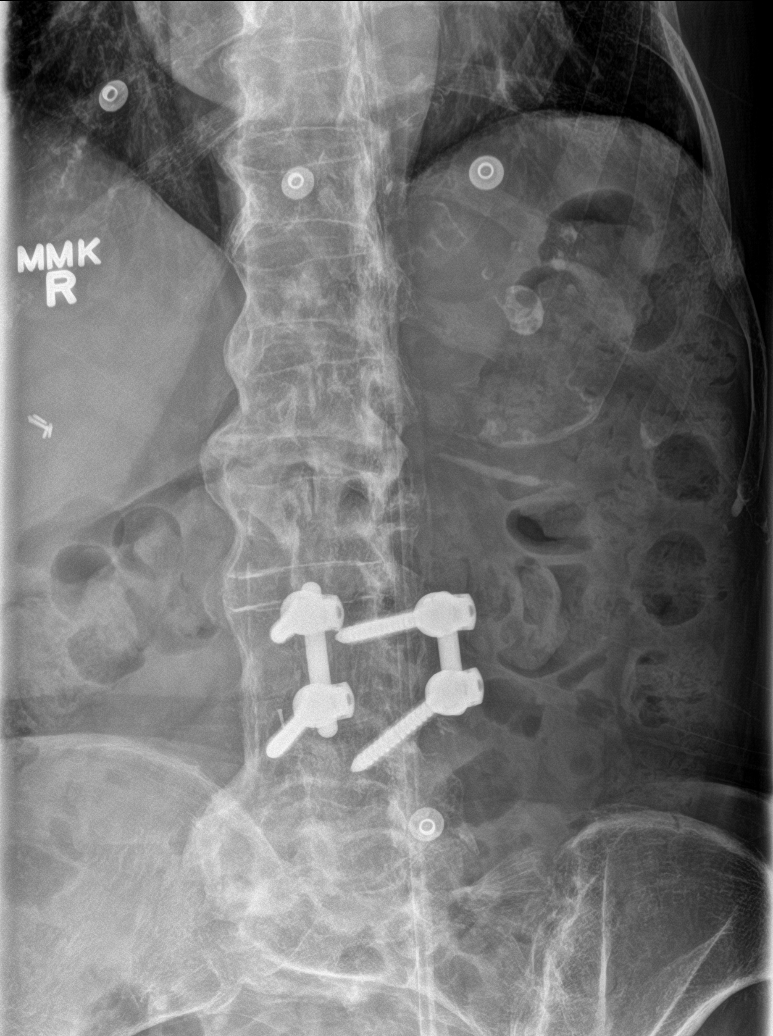

[l-spine lat]
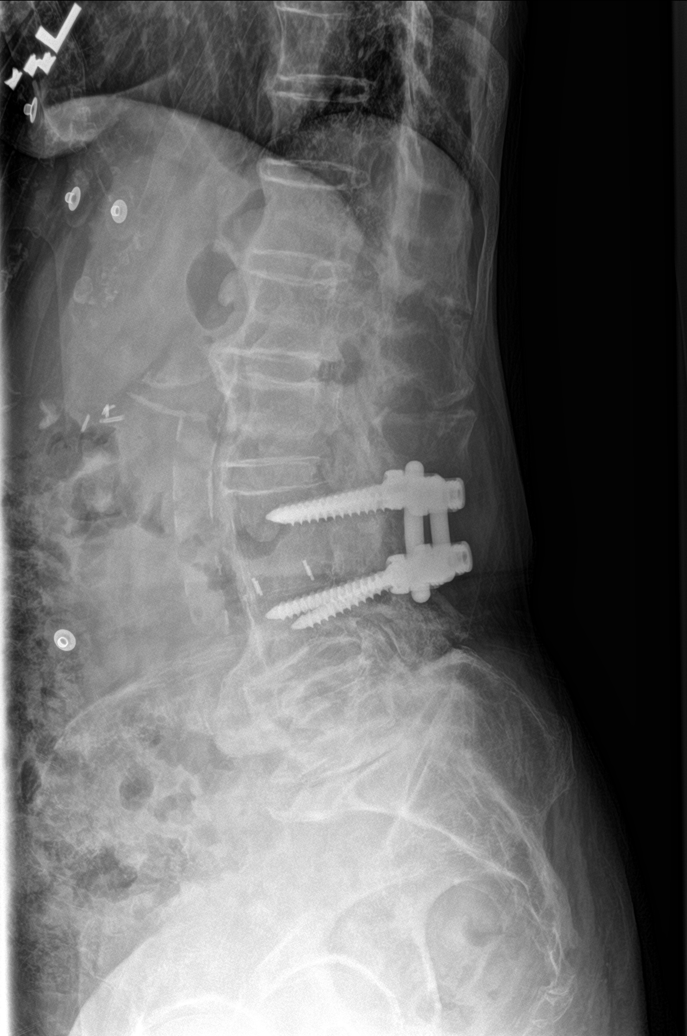

[l-spine spot]
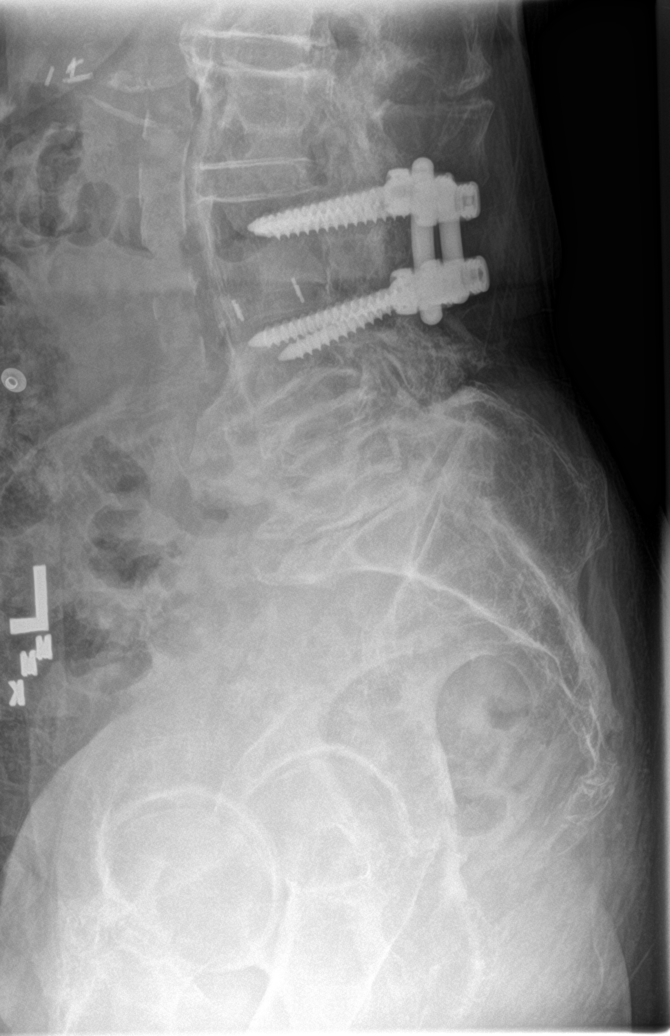

[l-spine obl (3 of 3)]
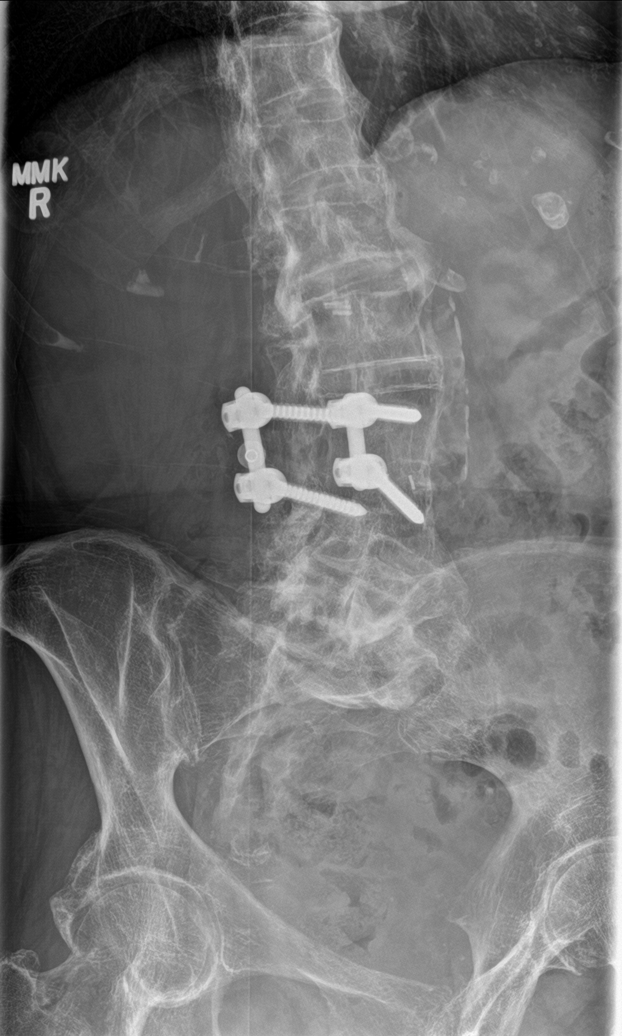

[6 of 6 positions shown; findings below may reference images not displayed]

FINDINGS: Surgical clips right upper quadrant. Diffuse osteopenia. L3-L4
posterior interbody fusion. Hardware intact. Anatomic alignment.
Diffuse multilevel severe degenerative change. Diffuse ankylosis
consistent ankylosing spondylitis noted. No acute bony abnormality.
Aortoiliac and visceral atherosclerotic vascular disease.
IMPRESSION: 1. L3-L4 posterior and interbody fusion. Hardware intact. Anatomic
alignment.

2. Diffuse osteopenia. Diffuse severe lumbar spine degenerative
change. Diffuse thoracolumbar spine ankylosis consistent ankylosing
spondylitis.

## 2020-01-20 ENCOUNTER — Telehealth: Payer: Self-pay

## 2020-01-20 NOTE — Telephone Encounter (Signed)
Patient's daughter contacted the office and she states that the patient was seen in the office on 07/25/2019, and they just received a bill for $325. She states patient was under hospice care at this time, and hospice should cover this visit. She contacted the billing department and they told her that there is no documentation that the patient was ever on hospice care - and she would be responsible of this bill. Raquel Sarna, is this something you can help with?

## 2020-01-20 NOTE — Telephone Encounter (Signed)
See other note
